# Patient Record
Sex: Female | Born: 1940
Health system: Southern US, Community
[De-identification: ages and names within clinical notes are randomized; demographics above are authoritative.]

## PROBLEM LIST (undated history)

## (undated) DIAGNOSIS — K8020 Calculus of gallbladder without cholecystitis without obstruction: Secondary | ICD-10-CM

## (undated) DIAGNOSIS — H269 Unspecified cataract: Secondary | ICD-10-CM

## (undated) DIAGNOSIS — I10 Essential (primary) hypertension: Secondary | ICD-10-CM

## (undated) DIAGNOSIS — C50919 Malignant neoplasm of unspecified site of unspecified female breast: Secondary | ICD-10-CM

## (undated) DIAGNOSIS — K859 Acute pancreatitis without necrosis or infection, unspecified: Secondary | ICD-10-CM

## (undated) DIAGNOSIS — R87619 Unspecified abnormal cytological findings in specimens from cervix uteri: Secondary | ICD-10-CM

## (undated) DIAGNOSIS — E119 Type 2 diabetes mellitus without complications: Secondary | ICD-10-CM

## (undated) DIAGNOSIS — IMO0002 Reserved for concepts with insufficient information to code with codable children: Secondary | ICD-10-CM

## (undated) DIAGNOSIS — T7840XA Allergy, unspecified, initial encounter: Secondary | ICD-10-CM

## (undated) DIAGNOSIS — K7460 Unspecified cirrhosis of liver: Secondary | ICD-10-CM

## (undated) DIAGNOSIS — F419 Anxiety disorder, unspecified: Secondary | ICD-10-CM

## (undated) DIAGNOSIS — B1920 Unspecified viral hepatitis C without hepatic coma: Secondary | ICD-10-CM

## (undated) DIAGNOSIS — D696 Thrombocytopenia, unspecified: Secondary | ICD-10-CM

## (undated) DIAGNOSIS — E669 Obesity, unspecified: Secondary | ICD-10-CM

## (undated) HISTORY — PX: OTHER SURGICAL HISTORY: SHX169

## (undated) HISTORY — PX: REDUCTION MAMMAPLASTY: SUR839

## (undated) HISTORY — DX: Reserved for concepts with insufficient information to code with codable children: IMO0002

## (undated) HISTORY — PX: CATARACT EXTRACTION, BILATERAL: SHX1313

## (undated) HISTORY — DX: Unspecified abnormal cytological findings in specimens from cervix uteri: R87.619

## (undated) HISTORY — DX: Essential (primary) hypertension: I10

## (undated) HISTORY — PX: POLYPECTOMY: SHX149

## (undated) HISTORY — PX: BREAST LUMPECTOMY: SHX2

## (undated) HISTORY — PX: COLONOSCOPY: SHX174

## (undated) HISTORY — DX: Anxiety disorder, unspecified: F41.9

## (undated) HISTORY — DX: Malignant neoplasm of unspecified site of unspecified female breast: C50.919

## (undated) HISTORY — DX: Obesity, unspecified: E66.9

## (undated) HISTORY — DX: Calculus of gallbladder without cholecystitis without obstruction: K80.20

## (undated) HISTORY — DX: Allergy, unspecified, initial encounter: T78.40XA

---
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
## 1970-12-23 HISTORY — PX: THYROIDECTOMY, PARTIAL: SHX18

## 1998-07-11 ENCOUNTER — Other Ambulatory Visit: Admission: RE | Admit: 1998-07-11 | Discharge: 1998-07-11 | Payer: Self-pay | Admitting: General Surgery

## 1998-12-23 DIAGNOSIS — C50919 Malignant neoplasm of unspecified site of unspecified female breast: Secondary | ICD-10-CM

## 1998-12-23 HISTORY — DX: Malignant neoplasm of unspecified site of unspecified female breast: C50.919

## 1999-09-06 ENCOUNTER — Encounter (INDEPENDENT_AMBULATORY_CARE_PROVIDER_SITE_OTHER): Payer: Self-pay | Admitting: Specialist

## 1999-09-06 ENCOUNTER — Ambulatory Visit (HOSPITAL_COMMUNITY): Admission: RE | Admit: 1999-09-06 | Discharge: 1999-09-06 | Payer: Self-pay | Admitting: General Surgery

## 1999-09-06 ENCOUNTER — Encounter: Payer: Self-pay | Admitting: General Surgery

## 1999-09-10 ENCOUNTER — Ambulatory Visit (HOSPITAL_COMMUNITY): Admission: RE | Admit: 1999-09-10 | Discharge: 1999-09-10 | Payer: Self-pay | Admitting: General Surgery

## 1999-09-10 ENCOUNTER — Encounter (INDEPENDENT_AMBULATORY_CARE_PROVIDER_SITE_OTHER): Payer: Self-pay

## 1999-10-26 ENCOUNTER — Other Ambulatory Visit: Admission: RE | Admit: 1999-10-26 | Discharge: 1999-10-26 | Payer: Self-pay | Admitting: General Surgery

## 2000-02-26 ENCOUNTER — Encounter (INDEPENDENT_AMBULATORY_CARE_PROVIDER_SITE_OTHER): Payer: Self-pay | Admitting: Specialist

## 2000-02-26 ENCOUNTER — Ambulatory Visit (HOSPITAL_COMMUNITY): Admission: RE | Admit: 2000-02-26 | Discharge: 2000-02-26 | Payer: Self-pay | Admitting: General Surgery

## 2000-10-27 ENCOUNTER — Encounter: Payer: Self-pay | Admitting: Emergency Medicine

## 2000-10-27 ENCOUNTER — Emergency Department (HOSPITAL_COMMUNITY): Admission: EM | Admit: 2000-10-27 | Discharge: 2000-10-27 | Payer: Self-pay | Admitting: Emergency Medicine

## 2001-03-07 ENCOUNTER — Encounter: Payer: Self-pay | Admitting: Emergency Medicine

## 2001-03-07 ENCOUNTER — Emergency Department (HOSPITAL_COMMUNITY): Admission: EM | Admit: 2001-03-07 | Discharge: 2001-03-07 | Payer: Self-pay | Admitting: Emergency Medicine

## 2003-08-16 ENCOUNTER — Encounter: Payer: Self-pay | Admitting: Oncology

## 2003-08-16 ENCOUNTER — Encounter: Admission: RE | Admit: 2003-08-16 | Discharge: 2003-08-16 | Payer: Self-pay | Admitting: Oncology

## 2003-09-26 ENCOUNTER — Ambulatory Visit (HOSPITAL_COMMUNITY): Admission: RE | Admit: 2003-09-26 | Discharge: 2003-09-26 | Payer: Self-pay | Admitting: Specialist

## 2003-12-24 HISTORY — PX: BREAST REDUCTION SURGERY: SHX8

## 2004-01-02 ENCOUNTER — Encounter (INDEPENDENT_AMBULATORY_CARE_PROVIDER_SITE_OTHER): Payer: Self-pay | Admitting: Specialist

## 2004-01-02 ENCOUNTER — Ambulatory Visit (HOSPITAL_COMMUNITY): Admission: RE | Admit: 2004-01-02 | Discharge: 2004-01-02 | Payer: Self-pay | Admitting: Specialist

## 2004-01-02 ENCOUNTER — Ambulatory Visit (HOSPITAL_BASED_OUTPATIENT_CLINIC_OR_DEPARTMENT_OTHER): Admission: RE | Admit: 2004-01-02 | Discharge: 2004-01-02 | Payer: Self-pay | Admitting: Specialist

## 2004-01-02 ENCOUNTER — Encounter (INDEPENDENT_AMBULATORY_CARE_PROVIDER_SITE_OTHER): Payer: Self-pay | Admitting: *Deleted

## 2004-01-27 ENCOUNTER — Other Ambulatory Visit: Admission: RE | Admit: 2004-01-27 | Discharge: 2004-01-27 | Payer: Self-pay | Admitting: Internal Medicine

## 2004-01-27 ENCOUNTER — Encounter: Admission: RE | Admit: 2004-01-27 | Discharge: 2004-01-27 | Payer: Self-pay | Admitting: Internal Medicine

## 2004-02-02 ENCOUNTER — Ambulatory Visit: Admission: RE | Admit: 2004-02-02 | Discharge: 2004-04-13 | Payer: Self-pay | Admitting: Radiation Oncology

## 2004-02-13 ENCOUNTER — Encounter: Admission: RE | Admit: 2004-02-13 | Discharge: 2004-02-13 | Payer: Self-pay | Admitting: Oncology

## 2004-04-20 ENCOUNTER — Ambulatory Visit: Admission: RE | Admit: 2004-04-20 | Discharge: 2004-04-20 | Payer: Self-pay | Admitting: Radiation Oncology

## 2004-07-25 ENCOUNTER — Encounter: Admission: RE | Admit: 2004-07-25 | Discharge: 2004-07-25 | Payer: Self-pay | Admitting: Internal Medicine

## 2004-08-07 ENCOUNTER — Other Ambulatory Visit: Admission: RE | Admit: 2004-08-07 | Discharge: 2004-08-07 | Payer: Self-pay | Admitting: Internal Medicine

## 2005-02-05 ENCOUNTER — Encounter: Admission: RE | Admit: 2005-02-05 | Discharge: 2005-02-05 | Payer: Self-pay | Admitting: Internal Medicine

## 2005-06-06 ENCOUNTER — Other Ambulatory Visit: Admission: RE | Admit: 2005-06-06 | Discharge: 2005-06-06 | Payer: Self-pay | Admitting: Internal Medicine

## 2005-06-12 ENCOUNTER — Encounter: Admission: RE | Admit: 2005-06-12 | Discharge: 2005-06-12 | Payer: Self-pay | Admitting: Internal Medicine

## 2005-06-13 ENCOUNTER — Ambulatory Visit: Payer: Self-pay | Admitting: Oncology

## 2005-07-01 ENCOUNTER — Encounter: Admission: RE | Admit: 2005-07-01 | Discharge: 2005-07-01 | Payer: Self-pay | Admitting: Internal Medicine

## 2006-06-19 ENCOUNTER — Other Ambulatory Visit: Admission: RE | Admit: 2006-06-19 | Discharge: 2006-06-19 | Payer: Self-pay | Admitting: Internal Medicine

## 2006-06-19 LAB — HM PAP SMEAR

## 2006-07-04 ENCOUNTER — Encounter: Admission: RE | Admit: 2006-07-04 | Discharge: 2006-07-04 | Payer: Self-pay | Admitting: Internal Medicine

## 2006-07-04 LAB — HM MAMMOGRAPHY

## 2007-02-23 ENCOUNTER — Emergency Department (HOSPITAL_COMMUNITY): Admission: EM | Admit: 2007-02-23 | Discharge: 2007-02-23 | Payer: Self-pay | Admitting: Emergency Medicine

## 2008-06-22 ENCOUNTER — Emergency Department (HOSPITAL_COMMUNITY): Admission: EM | Admit: 2008-06-22 | Discharge: 2008-06-22 | Payer: Self-pay | Admitting: Emergency Medicine

## 2008-11-22 ENCOUNTER — Emergency Department (HOSPITAL_COMMUNITY): Admission: EM | Admit: 2008-11-22 | Discharge: 2008-11-22 | Payer: Self-pay | Admitting: Emergency Medicine

## 2010-06-30 ENCOUNTER — Emergency Department (HOSPITAL_COMMUNITY): Admission: EM | Admit: 2010-06-30 | Discharge: 2010-07-01 | Payer: Self-pay | Admitting: Emergency Medicine

## 2010-09-13 ENCOUNTER — Inpatient Hospital Stay (HOSPITAL_COMMUNITY): Admission: AD | Admit: 2010-09-13 | Discharge: 2010-09-13 | Payer: Self-pay | Admitting: Obstetrics and Gynecology

## 2010-09-13 ENCOUNTER — Ambulatory Visit: Payer: Self-pay | Admitting: Physician Assistant

## 2010-09-17 ENCOUNTER — Ambulatory Visit: Payer: Self-pay | Admitting: Internal Medicine

## 2010-10-23 ENCOUNTER — Ambulatory Visit: Payer: Self-pay | Admitting: Internal Medicine

## 2010-10-25 ENCOUNTER — Ambulatory Visit: Payer: Self-pay | Admitting: Internal Medicine

## 2010-11-22 ENCOUNTER — Ambulatory Visit: Payer: Self-pay | Admitting: Internal Medicine

## 2011-03-07 LAB — COMPREHENSIVE METABOLIC PANEL
ALT: 73 U/L — ABNORMAL HIGH (ref 0–35)
AST: 76 U/L — ABNORMAL HIGH (ref 0–37)
Albumin: 3.2 g/dL — ABNORMAL LOW (ref 3.5–5.2)
Alkaline Phosphatase: 164 U/L — ABNORMAL HIGH (ref 39–117)
BUN: 9 mg/dL (ref 6–23)
CO2: 27 mEq/L (ref 19–32)
Calcium: 8.7 mg/dL (ref 8.4–10.5)
Chloride: 97 mEq/L (ref 96–112)
Creatinine, Ser: 0.76 mg/dL (ref 0.4–1.2)
GFR calc Af Amer: >60 mL/min (ref >60)
GFR calc non Af Amer: >60 mL/min (ref >60)
Glucose, Bld: 411 mg/dL — ABNORMAL HIGH (ref 70–99)
Potassium: 3.6 mEq/L (ref 3.5–5.1)
Sodium: 133 mEq/L — ABNORMAL LOW (ref 135–145)
Total Bilirubin: 1.2 mg/dL (ref 0.3–1.2)
Total Protein: 7.7 g/dL (ref 6.0–8.3)

## 2011-03-07 LAB — GLUCOSE, CAPILLARY
Glucose-Capillary: 419 mg/dL — ABNORMAL HIGH (ref 70–99)
Glucose-Capillary: 440 mg/dL — ABNORMAL HIGH (ref 70–99)

## 2011-03-07 LAB — GC/CHLAMYDIA PROBE AMP, GENITAL
Chlamydia, DNA Probe: NEGATIVE
GC Probe Amp, Genital: NEGATIVE

## 2011-03-07 LAB — WET PREP, GENITAL
Clue Cells Wet Prep HPF POC: NONE SEEN
Trich, Wet Prep: NONE SEEN
Yeast Wet Prep HPF POC: NONE SEEN

## 2011-03-28 ENCOUNTER — Ambulatory Visit (INDEPENDENT_AMBULATORY_CARE_PROVIDER_SITE_OTHER): Payer: Medicare Other | Admitting: Internal Medicine

## 2011-03-28 DIAGNOSIS — I10 Essential (primary) hypertension: Secondary | ICD-10-CM

## 2011-03-28 DIAGNOSIS — E119 Type 2 diabetes mellitus without complications: Secondary | ICD-10-CM

## 2011-05-10 NOTE — Op Note (Signed)
NAMERolanda Jay [name]] A                    ACCOUNT NO.:  [id number]   MEDICAL RECORD NO.:  [id number]                   PATIENT TYPE:  AMB   LOCATION:  DSC                                  FACILITY:  [facility]   PHYSICIAN:  Gerald L. Shon Hough, M.D.           DATE OF BIRTH:  [birth year]   DATE OF PROCEDURE:  01/02/2004  DATE OF DISCHARGE:                                 OPERATIVE REPORT   HISTORY OF PRESENT ILLNESS:  This is a 70 year old lady with severe  macromastia and back and shoulder pain secondary to large pendulous breasts.  She has had a history of lumpectomies involving her right breast in the past  x2.  This was taken care of Timothy E. Earlene Plater, M.D., general surgeon.  We had  preliminary discussions preoperatively in regards to the surgery.  I did  have a verbal conversation and consultation with Dr. Earlene Plater reveals that the  breast will be fine to go ahead and consider.  She has had increased back  and shoulder pain secondary to large pendulous breasts, increased  intertrigo, and pitting at both shoulders.  She wears pressure bras to  support the weight of her breasts with no avail.  She has used creams,  talcs, as well as other ointments under the breasts for intertriginous  changes, have become infected and irritation.   PLAN:  Bilateral breast reductions using the inferior pedicle technique,  excision of accessory breast tissue.   ANESTHESIA:  General.   SURGEON:  Gerald L. Shon Hough, M.D.   ASSISTANT:  Alethia Berthold, CFA-P.A.C.   DESCRIPTION OF PROCEDURE:  Preoperatively, the patient was set up and drawn  for the inferior pedicle reduction mammoplasty remarking the nipple areolar  complexes from over 40 cm to 21 cm from the suprasternal notch.  She then  underwent general anesthesia intubated orally.  Prep was done to the chest  and breast areas in the routine fashion.  Using Betadine soap and solution  and walled off with sterile towels and drapes so as to make a  sterile field.  0.25% Xylocaine with epinephrine was injected locally for vasoconstriction  to incisional sites.  1:400,000 concentration for a total of 150 mL per  side.  The patient underwent prep to the areas after intubation with  Hibiclens solution.  The areas were scored with a #15 blade.  Then the skin  over the inferior pedicles were deepithelialized with #20 blade.  Medial and  lateral fatty dermal pedicles were excised down to underlying fascia.  Laterally, more tissue was removed to secure excision of the breast tissue  as well as the accessory breast tissue.  During the procedure and under the  previous lumpectomy incisions, there was some increased scar tissue  involving the right breast.  No significant mass effects.  There were  several blue dome cyst areas that I removed separately.  After the flaps  were transposed and stayed with 3-0 Prolene, subcutaneous sutures were  placed  x2 layers with 3-0 Monocryl and then a running subcuticular stitch of  3-0 Monocryl and 5-0 Monocryl throughout the inverted T.  I removed over  1443 grams on the left side and 1288 grams on the right.  She withstood the  procedures very well.  Steri-Strips and soft dressings were applied  including Xeroform, 4x4, ABD's, and Hypafix tape.  Estimated blood loss was  less than 150 mL.  Complications were none.  At the end of the procedure,  the nipple areolar complexes were examined showing good blood supply and  suppleness.                                               Yaakov Guthrie. Shon Hough, M.D.    Cathie Hoops  D:  01/02/2004  T:  01/02/2004  Job:  [id number]

## 2011-06-17 ENCOUNTER — Other Ambulatory Visit: Payer: Self-pay | Admitting: Internal Medicine

## 2011-08-19 ENCOUNTER — Other Ambulatory Visit: Payer: Self-pay | Admitting: *Deleted

## 2011-08-19 MED ORDER — SITAGLIPTIN PHOSPHATE 100 MG PO TABS: 100.0000 mg | ORAL_TABLET | Freq: Every day | ORAL | Status: DC

## 2011-09-27 LAB — BASIC METABOLIC PANEL
BUN: 6 mg/dL (ref 6–23)
CO2: 30 mEq/L (ref 19–32)
Calcium: 8.7 mg/dL (ref 8.4–10.5)
Chloride: 107 mEq/L (ref 96–112)
Creatinine, Ser: 1.01 mg/dL (ref 0.4–1.2)
GFR calc Af Amer: >60 mL/min (ref >60)
GFR calc non Af Amer: 55 mL/min — ABNORMAL LOW (ref >60)
Glucose, Bld: 146 mg/dL — ABNORMAL HIGH (ref 70–99)
Potassium: 4 mEq/L (ref 3.5–5.1)
Sodium: 140 mEq/L (ref 135–145)

## 2011-09-27 LAB — URINALYSIS, ROUTINE W REFLEX MICROSCOPIC
Bilirubin Urine: NEGATIVE
Glucose, UA: NEGATIVE mg/dL
Hgb urine dipstick: NEGATIVE
Ketones, ur: NEGATIVE mg/dL
Nitrite: NEGATIVE
Protein, ur: 30 mg/dL — AB
Specific Gravity, Urine: 1.019 (ref 1.005–1.030)
Urobilinogen, UA: 2 mg/dL — ABNORMAL HIGH (ref 0.0–1.0)
pH: 6 (ref 5.0–8.0)

## 2011-09-27 LAB — URINE MICROSCOPIC-ADD ON

## 2011-09-27 LAB — POCT CARDIAC MARKERS
CKMB, poc: 1.5 ng/mL (ref 1.0–8.0)
Myoglobin, poc: 133 ng/mL (ref 12–200)
Troponin i, poc: <0.05 ng/mL (ref 0.00–0.09)

## 2011-09-27 LAB — CBC
HCT: 42.5 % (ref 36.0–46.0)
Hemoglobin: 14.2 g/dL (ref 12.0–15.0)
MCHC: 33.3 g/dL (ref 30.0–36.0)
MCV: 89.1 fL (ref 78.0–100.0)
Platelets: 139 10*3/uL — ABNORMAL LOW (ref 150–400)
RBC: 4.77 MIL/uL (ref 3.87–5.11)
RDW: 15.3 % (ref 11.5–15.5)
WBC: 5.6 10*3/uL (ref 4.0–10.5)

## 2011-09-27 LAB — DIFFERENTIAL
Basophils Absolute: 0 10*3/uL (ref 0.0–0.1)
Basophils Relative: 1 % (ref 0–1)
Eosinophils Absolute: 0.1 10*3/uL (ref 0.0–0.7)
Eosinophils Relative: 1 % (ref 0–5)
Lymphocytes Relative: 42 % (ref 12–46)
Lymphs Abs: 2.4 10*3/uL (ref 0.7–4.0)
Monocytes Absolute: 0.4 10*3/uL (ref 0.1–1.0)
Monocytes Relative: 6 % (ref 3–12)
Neutro Abs: 2.8 10*3/uL (ref 1.7–7.7)
Neutrophils Relative %: 50 % (ref 43–77)

## 2011-10-01 ENCOUNTER — Encounter: Payer: Self-pay | Admitting: Internal Medicine

## 2011-10-03 ENCOUNTER — Ambulatory Visit: Payer: Medicare Other | Admitting: Internal Medicine

## 2011-10-03 ENCOUNTER — Encounter: Payer: Medicare Other | Admitting: Internal Medicine

## 2011-10-07 ENCOUNTER — Encounter: Payer: Self-pay | Admitting: Internal Medicine

## 2011-10-07 ENCOUNTER — Ambulatory Visit (INDEPENDENT_AMBULATORY_CARE_PROVIDER_SITE_OTHER): Payer: Medicare Other | Admitting: Internal Medicine

## 2011-10-07 DIAGNOSIS — F411 Generalized anxiety disorder: Secondary | ICD-10-CM

## 2011-10-07 DIAGNOSIS — F419 Anxiety disorder, unspecified: Secondary | ICD-10-CM

## 2011-10-07 DIAGNOSIS — Z853 Personal history of malignant neoplasm of breast: Secondary | ICD-10-CM | POA: Insufficient documentation

## 2011-10-07 DIAGNOSIS — E119 Type 2 diabetes mellitus without complications: Secondary | ICD-10-CM | POA: Insufficient documentation

## 2011-10-07 DIAGNOSIS — I10 Essential (primary) hypertension: Secondary | ICD-10-CM

## 2011-10-07 LAB — BASIC METABOLIC PANEL
BUN: 13 mg/dL (ref 6–23)
CO2: 28 mEq/L (ref 19–32)
Calcium: 8.7 mg/dL (ref 8.4–10.5)
Chloride: 103 mEq/L (ref 96–112)
Creat: 1.03 mg/dL (ref 0.50–1.10)
Glucose, Bld: 213 mg/dL — ABNORMAL HIGH (ref 70–99)
Potassium: 4 mEq/L (ref 3.5–5.3)
Sodium: 139 mEq/L (ref 135–145)

## 2011-10-07 LAB — HEMOGLOBIN A1C
Hgb A1c MFr Bld: 7 % — ABNORMAL HIGH (ref <5.7)
Mean Plasma Glucose: 154 mg/dL — ABNORMAL HIGH (ref <117)

## 2011-10-07 NOTE — Progress Notes (Signed)
  Subjective:    Patient ID: Victoria [name], female    DOB: Sep 26, 1941, 70 y.o.   MRN: [id number]  HPI  70 year old black female with history of hypertension, obesity, adult onset diabetes in today with anxiety. 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 This was of the right breast. There was a reexcision in September 2000 showing a single microscopic focus of intraductal carcinoma (cribriform type). She did have radiation therapy. Has not seen oncologist since 2005. 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 2 intramedullary lymph nodes were negative for tumor. This tumor was ER/PR positive HER-2/neu negative she was subsequently referred to radiation oncology for further radiation of remaining right breast tissue. She had been on Prempro since 1997. There was no family history of breast cancer. Tamoxifen was recommended the patient declined taking it. She was lost to followup for about a year until around may 2002. Oncologist told her that breast reduction surgery could be pursued. She had a lot of back and neck pain. 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 Currently unemployed. Breast tumor was positive for estrogen and progesterone receptors. History of abnormal Pap smear [facility]IN-01-17-2004. Her sister died recently of colon cancer. She was in her 59s and lived in [place]. Patient had a colonoscopy by Dr. Juanda Chance September 2005 and repeat study was recommended in 10 years. Of concern is that patient has not had a mammogram since 2007. Order given for mammogram today. History of partial left thyroidectomy at [facility] in [facility]. Will get influenza immunization at [facility]. Had tetanus immunization 2005 and Pneumovax vaccine 2007. Last hemoglobin A1c in April 2012 was 6.2%. Patient reports feeling  lonely. Has appropriate grief reaction from sister's death. Denies being significantly depressed. No suicidal ideations.    Review of Systems     Objective:   Physical Exam chest clear; cardiac exam regular rate and rhythm; extremities without edema        Assessment & Plan:  Anxiety  Adult onset diabetes  History of breast cancer  Hypertension  Plan Xanax 0.5 mg when sees #60 (one half to one by mouth twice daily when necessary anxiety with 1 refill. See in 6 months. Hemoglobin A1c drawn today.  Hypertension  History of breast cancer  Xanax 0.5 mg #60 with 1 refill patient is to take one half to one tablet by mouth twice daily as needed for anxiety. Patient is to return in 6 months for physical exam.

## 2011-11-07 ENCOUNTER — Other Ambulatory Visit: Payer: Self-pay | Admitting: Internal Medicine

## 2011-11-07 DIAGNOSIS — Z853 Personal history of malignant neoplasm of breast: Secondary | ICD-10-CM

## 2011-11-07 DIAGNOSIS — Z1231 Encounter for screening mammogram for malignant neoplasm of breast: Secondary | ICD-10-CM

## 2011-12-06 ENCOUNTER — Ambulatory Visit: Payer: Medicare Other

## 2011-12-12 ENCOUNTER — Ambulatory Visit: Payer: Medicare Other

## 2012-01-14 ENCOUNTER — Other Ambulatory Visit: Payer: Self-pay | Admitting: Internal Medicine

## 2012-02-04 ENCOUNTER — Other Ambulatory Visit: Payer: Self-pay

## 2012-02-17 ENCOUNTER — Other Ambulatory Visit: Payer: Self-pay | Admitting: Internal Medicine

## 2012-03-31 ENCOUNTER — Ambulatory Visit (INDEPENDENT_AMBULATORY_CARE_PROVIDER_SITE_OTHER): Payer: Medicare Other | Admitting: Internal Medicine

## 2012-03-31 ENCOUNTER — Encounter: Payer: Self-pay | Admitting: Internal Medicine

## 2012-03-31 DIAGNOSIS — L259 Unspecified contact dermatitis, unspecified cause: Secondary | ICD-10-CM | POA: Diagnosis not present

## 2012-03-31 DIAGNOSIS — I10 Essential (primary) hypertension: Secondary | ICD-10-CM

## 2012-03-31 DIAGNOSIS — E119 Type 2 diabetes mellitus without complications: Secondary | ICD-10-CM

## 2012-04-01 LAB — HEMOGLOBIN A1C
Hgb A1c MFr Bld: 6.8 % — ABNORMAL HIGH (ref <5.7)
Mean Plasma Glucose: 148 mg/dL — ABNORMAL HIGH (ref <117)

## 2012-04-08 ENCOUNTER — Other Ambulatory Visit: Payer: Self-pay | Admitting: Internal Medicine

## 2012-04-19 ENCOUNTER — Encounter: Payer: Self-pay | Admitting: Internal Medicine

## 2012-04-19 NOTE — Patient Instructions (Signed)
Use cream prescribed sparingly on skin 3 times daily. Take Atarax or Benadryl for itching.

## 2012-04-19 NOTE — Progress Notes (Signed)
  Subjective:    Patient ID: Victoria [name], female    DOB: 04-26-1941, 71 y.o.   MRN: [id number]  HPI Patient has generalized itching on trunk, legs, and arms for the past several days. History of diabetes mellitus. History of hypertension and breast cancer. Had partial left thyroidectomy at [facility] in 1972. Had colonoscopy 2005 with repeat study recommended in 10 years. Had tetanus immunization February 2005, Pneumovax immunization July 2007. Blood pressure is controlled with losartan/ HCTZ. Patient takes Januvia and metformin for diabetes.     Review of Systems     Objective:   Physical Exam diffuse dermatitis on abdomen, legs and arms as well as upper back.        Assessment & Plan:  Probable contact dermatitis etiology unclear  Plan: Triamcinolone 0.1% 60 g in 4 ounces abuse or and cream to use sparingly on rash 3 times a day. May take Benadryl for itching by mouth. Call if not better in 5-7 days or sooner if worse. Hemoglobin A1c drawn today in followup of diabetes. Blood pressure under good control

## 2012-05-20 ENCOUNTER — Other Ambulatory Visit: Payer: Self-pay | Admitting: Internal Medicine

## 2012-08-18 ENCOUNTER — Other Ambulatory Visit: Payer: Self-pay | Admitting: Internal Medicine

## 2012-08-18 NOTE — Telephone Encounter (Signed)
[facility] [address] call for refill on losartan/HCTZ 100/25. Patient last seen here in the spring. D. for physical exam after 10/06/2012. Okay to refill losartan/HCTZ 100/25 #30 through 10/22/2012. She's also requesting refill on Xanax 0.5 mg #60 one half to one by mouth twice daily. Given #60 with no refill. Patient will be contacted about booking physical exam in late October

## 2012-08-20 ENCOUNTER — Other Ambulatory Visit: Payer: Self-pay

## 2012-08-20 MED ORDER — LOSARTAN POTASSIUM-HCTZ 100-25 MG PO TABS: 1.0000 | ORAL_TABLET | Freq: Every day | ORAL | Status: DC

## 2012-08-20 MED ORDER — ALPRAZOLAM 0.5 MG PO TBDP: 0.5000 mg | ORAL_TABLET | Freq: Two times a day (BID) | ORAL | Status: AC | PRN

## 2012-09-28 DIAGNOSIS — H2510 Age-related nuclear cataract, unspecified eye: Secondary | ICD-10-CM | POA: Diagnosis not present

## 2012-09-28 DIAGNOSIS — IMO0002 Reserved for concepts with insufficient information to code with codable children: Secondary | ICD-10-CM | POA: Diagnosis not present

## 2012-09-28 DIAGNOSIS — E119 Type 2 diabetes mellitus without complications: Secondary | ICD-10-CM | POA: Diagnosis not present

## 2012-10-05 ENCOUNTER — Ambulatory Visit (INDEPENDENT_AMBULATORY_CARE_PROVIDER_SITE_OTHER): Payer: Medicare Other | Admitting: Internal Medicine

## 2012-10-05 ENCOUNTER — Encounter: Payer: Self-pay | Admitting: Internal Medicine

## 2012-10-05 VITALS — BP 144/90 | HR 72 | Temp 97.7°F | Ht 67.75 in | Wt 240.0 lb

## 2012-10-05 DIAGNOSIS — M949 Disorder of cartilage, unspecified: Secondary | ICD-10-CM | POA: Diagnosis not present

## 2012-10-05 DIAGNOSIS — J309 Allergic rhinitis, unspecified: Secondary | ICD-10-CM

## 2012-10-05 DIAGNOSIS — E119 Type 2 diabetes mellitus without complications: Secondary | ICD-10-CM

## 2012-10-05 DIAGNOSIS — Z0000 Encounter for general adult medical examination without abnormal findings: Secondary | ICD-10-CM

## 2012-10-05 DIAGNOSIS — M899 Disorder of bone, unspecified: Secondary | ICD-10-CM

## 2012-10-05 DIAGNOSIS — Z853 Personal history of malignant neoplasm of breast: Secondary | ICD-10-CM | POA: Diagnosis not present

## 2012-10-05 DIAGNOSIS — I10 Essential (primary) hypertension: Secondary | ICD-10-CM

## 2012-10-05 DIAGNOSIS — M8580 Other specified disorders of bone density and structure, unspecified site: Secondary | ICD-10-CM

## 2012-10-05 LAB — POCT URINALYSIS DIPSTICK
Blood, UA: NEGATIVE
Glucose, UA: NEGATIVE
Ketones, UA: NEGATIVE
Nitrite, UA: NEGATIVE
Protein, UA: NEGATIVE
Spec Grav, UA: 1.015
Urobilinogen, UA: NEGATIVE
pH, UA: 7

## 2012-10-05 MED ORDER — METHYLPREDNISOLONE ACETATE 80 MG/ML IJ SUSP
80.0000 mg | Freq: Once | INTRAMUSCULAR | Status: AC
  Administered 2012-10-05: 80 mg via INTRAMUSCULAR

## 2012-10-05 NOTE — Progress Notes (Signed)
Subjective:    Patient ID: Victoria [name], female    DOB: [birth year], 71 y.o.   MRN: [id number]  HPI71 year Black female with history of DM HTN and Breast cancer for health maintenance and evaluation of medical problems. Has new complaints of trigger finger right third finger for about 6 months. Also, skin on legs is itchy and has been present. Needs Dermatology consult. Eye exam by Dr. Nile Riggs done last week and will have cataract extractions in the near future.Need to have mamogram. Need to be assessed for  runny nose, sinus pressure. History of Allergic Rhinitis. No history of glaucoma.  Out of Januvia last 2 months because of expense. Has been taking antihypertensive meds. Difficulty affording Januvia. 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 There was a reexcision in September 2000 showing a single microscopic focus of intraductal carcinoma. She did have radiation therapy. Has not been back to oncologist since 2005. 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 2 lymph nodes were negative for tumor. Tumor was estrogen receptor progesterone receptor positive. She subsequently was referred to radiation oncology for further radiation of remaining right breast tissue. She had been on as did replacement since 1997 at the time of her diagnosis. No family history of breast cancer. Tamoxifen was recommended the patient declined to take it. She was lost to followup for about a year in [place] may 2002. At that time she was told she could have breast reduction surgery which she had in 2005. 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 History of abnormal Pap smear 2005.  History of partial left thyroidectomy at [facility] in [place]. Has never been on thyroid replacement.  Family history: Sister died of colon cancer. She has 3 children.  Social history: Does not smoke or consume alcohol    Review of Systems  Constitutional: Negative.   HENT: Positive for congestion, rhinorrhea, sneezing and postnasal drip.   Eyes:       Bilateral cataracts; itchy eyes  Respiratory: Negative.   Genitourinary: Negative.   Musculoskeletal:       Right trigger finger  Neurological: Negative.   Hematological: Negative.   Psychiatric/Behavioral: Negative.        Objective:   Physical Exam  Vitals reviewed. Constitutional: She is oriented to person, place, and time. She appears well-developed and well-nourished. No distress.  HENT:  Head: Normocephalic and atraumatic.  Eyes: Conjunctivae normal and EOM are normal. Pupils are equal, round, and reactive to light. Right eye exhibits no discharge. Left eye exhibits no discharge. No scleral icterus.  Neck: Neck supple. No JVD present. Thyromegaly present.  Cardiovascular: Normal rate, regular rhythm and normal heart sounds.   Pulmonary/Chest: Effort normal and breath sounds normal. She has no wheezes. She has no rales.  Abdominal: Bowel sounds are normal. She exhibits no distension and no mass. There is no tenderness. There is no rebound and no guarding.  Genitourinary:       Refused pelvic exam  Musculoskeletal: Normal range of motion. She exhibits no edema.       Trigger finger right third finger  Lymphadenopathy:    She has no cervical adenopathy.  Neurological: She is alert and oriented to person, place, and time.  She has normal reflexes. No cranial nerve deficit. Coordination abnormal.  Skin: Skin is warm and dry. Rash noted. She is not diaphoretic.       Macular papular itchy areas lower legs  Psychiatric: She has a normal mood and affect. Her behavior is normal. Judgment and thought content normal.          Assessment & Plan:  Diabetes  mellitus-out of Januvia at the present time the past couple of months  Hypertension-has been compliant with medication according to patient and has not run out of these medications.  History of breast cancer-needs to get mammogram in the near future. Expresses some reluctance  Health maintenance-declines influenza immunization. Prescription given to get Zostavax vaccine at pharmacy  Right trigger finger-refer to hand surgeon for injection  Dermatitis lower extremities-refer to dermatologist  Allergic rhinitis-Depo-Medrol 80 mg IM given in office today. Patient is to avoid antihistamines containing decongestants because they will elevate her blood pressure. Return in 6 months at which time she'll need hemoglobin A1c, office visit, blood pressure check Subjective:   Patient presents for Medicare Annual/Subsequent preventive examination.   Review Past Medical/Family/Social: See above   Risk Factors  Current exercise habits: sedentary Dietary issues discussed: low fat low carb diet  Cardiac risk factors: Obesity (BMI >= 30 kg/m2).   Depression Screen  (Note: if answer to either of the following is "Yes", a more complete depression screening is indicated)  Over the past two weeks, have you felt down, depressed or hopeless? no Over the past two weeks, have you felt little interest or pleasure in doing things? no Have you lost interest or pleasure in daily life? no Do you often feel hopeless? no Do you cry easily over simple problems? No   Activities of Daily Living  In your present state of health, do you have any difficulty performing the following activities?:  Driving? No  Managing money? No  Feeding yourself? No  Getting from bed to chair? No  Climbing a flight of stairs? No  Preparing food and eating?: No  Bathing or showering? No  Getting dressed: No  Getting to the toilet? No  Using the toilet:No  Moving around from place to place: No  In the past year have you fallen  or had a near fall?:No  Are you sexually active? yes  Do you have more than one partner? No   Hearing Difficulties: No  Do you often ask people to speak up or repeat themselves? No  Do you experience ringing or noises in your ears? Yes  Do you have difficulty understanding soft or whispered voices? No  Do you feel that you have a problem with memory? Yes  Do you often misplace items? No  Do you feel safe at home? Yes   Cognitive Testing  Alert? Yes Normal Appearance?Yes  Oriented to person? Yes Place? Yes  Time? Yes  Recall of three objects? Yes  Can perform simple calculations? Yes  Displays appropriate judgment?Yes  Can read the correct time from a watch face?Yes   List the Names of Other Physician/Practitioners you currently use: Dr. Nile Riggs.   Indicate any recent Medical Services you may have received from other than [facility] providers in the past year (date may be approximate).   Screening Tests / Date Colonoscopy   - done 2005                  Zostavax - wait until age 76 Mammogram - order given Influenza Vaccine declined  Tetanus/tdapFeb 2005   Objective:   Vision by Snellen chart: - done by Dr. Nile Riggs    General appearance: Appears stated age and mildly obese  Head: Normocephalic, without obvious abnormality, atraumatic  Eyes: conj clear, EOMi PEERLA  Ears: normal TM's and external ear canals both ears  Nose: Nares normal. Septum midline. Mucosa normal. No drainage or sinus tenderness.  Throat: lips, mucosa, and tongue normal; teeth and gums normal  Neck: no adenopathy, no carotid bruit, no JVD, supple, symmetrical, trachea midline and thyroid not enlarged, symmetric, no tenderness/mass/nodules  No CVA tenderness.  Lungs: clear to auscultation bilaterally  Breasts: normal appearance, no masses or tenderness,  Heart: regular rate and rhythm, S1, S2 normal, no murmur, click, rub or gallop  Abdomen: soft, non-tender; bowel sounds normal; no masses, no organomegaly   Musculoskeletal: ROM normal in all joints, no crepitus, no deformity, Normal muscle strengthen. Back  is symmetric, no curvature. Skin: Skin color, texture, turgor normal. No rashes or lesions  Lymph nodes: Cervical, supraclavicular, and axillary nodes normal.  Neurologic: CN 2 -12 Normal, Normal symmetric reflexes. Normal coordination and gait  Psych: Alert & Oriented x 3, Mood appear stable.    Assessment:    Annual wellness medicare exam   Plan:    During the course of the visit the patient was educated and counseled about appropriate screening and preventive services including:  Screening mammography - order given Colorectal cancer screening  Colonoscopy 09/11/04 Shingles vaccine. Prescription given to that she can get the vaccine at the pharmacy or Medicare part D.  Screen + for depression  - done  Diet review for nutrition referral? Yes ____ Not Indicated __x__  Patient Instructions (the written plan) was given to the patient.  Medicare Attestation  I have personally reviewed:  The patient's medical and social history  Their use of alcohol, tobacco or illicit drugs  Their current medications and supplements  The patient's functional ability including ADLs,fall risks, home safety risks, cognitive, and hearing and visual impairment  Diet and physical activities  Evidence for depression or mood disorders  The patient's weight, height, BMI, and visual acuity have been recorded in the chart. I have made referrals, counseling, and provided education to the patient based on review of the above and I have provided the patient with a written personalized care plan for preventive services.    !

## 2012-10-05 NOTE — Patient Instructions (Addendum)
Please take  diabetic medications. Try to find Januvia at a better price. May consider patient assistant program through health department. We will try to get samples of Januvia for you. In the meantime take  Trajenta 5 mg daily. You have been given Depo-Medrol 80 mg IM for allergic rhinitis.

## 2012-10-06 LAB — LIPID PANEL
Cholesterol: 119 mg/dL (ref 0–200)
HDL: 41 mg/dL (ref >39)
LDL Cholesterol: 60 mg/dL (ref 0–99)
Total CHOL/HDL Ratio: 2.9 Ratio
Triglycerides: 88 mg/dL (ref <150)
VLDL: 18 mg/dL (ref 0–40)

## 2012-10-06 LAB — CBC WITH DIFFERENTIAL/PLATELET
Basophils Absolute: 0 10*3/uL (ref 0.0–0.1)
Basophils Relative: 0 % (ref 0–1)
Eosinophils Absolute: 0.1 10*3/uL (ref 0.0–0.7)
Eosinophils Relative: 1 % (ref 0–5)
HCT: 39.4 % (ref 36.0–46.0)
Hemoglobin: 13.6 g/dL (ref 12.0–15.0)
Lymphocytes Relative: 49 % — ABNORMAL HIGH (ref 12–46)
Lymphs Abs: 2.9 10*3/uL (ref 0.7–4.0)
MCH: 29.2 pg (ref 26.0–34.0)
MCHC: 34.5 g/dL (ref 30.0–36.0)
MCV: 84.7 fL (ref 78.0–100.0)
Monocytes Absolute: 0.4 10*3/uL (ref 0.1–1.0)
Monocytes Relative: 7 % (ref 3–12)
Neutro Abs: 2.5 10*3/uL (ref 1.7–7.7)
Neutrophils Relative %: 43 % (ref 43–77)
Platelets: 123 10*3/uL — ABNORMAL LOW (ref 150–400)
RBC: 4.65 MIL/uL (ref 3.87–5.11)
RDW: 14.8 % (ref 11.5–15.5)
WBC: 5.9 10*3/uL (ref 4.0–10.5)

## 2012-10-06 LAB — COMPREHENSIVE METABOLIC PANEL
ALT: 55 U/L — ABNORMAL HIGH (ref 0–35)
AST: 54 U/L — ABNORMAL HIGH (ref 0–37)
Albumin: 3.5 g/dL (ref 3.5–5.2)
Alkaline Phosphatase: 81 U/L (ref 39–117)
BUN: 13 mg/dL (ref 6–23)
CO2: 25 mEq/L (ref 19–32)
Calcium: 8.7 mg/dL (ref 8.4–10.5)
Chloride: 104 mEq/L (ref 96–112)
Creat: 0.82 mg/dL (ref 0.50–1.10)
Glucose, Bld: 108 mg/dL — ABNORMAL HIGH (ref 70–99)
Potassium: 3.8 mEq/L (ref 3.5–5.3)
Sodium: 137 mEq/L (ref 135–145)
Total Bilirubin: 0.8 mg/dL (ref 0.3–1.2)
Total Protein: 7.1 g/dL (ref 6.0–8.3)

## 2012-10-06 LAB — TSH: TSH: 0.93 u[IU]/mL (ref 0.350–4.500)

## 2012-10-06 LAB — HEMOGLOBIN A1C
Hgb A1c MFr Bld: 6.5 % — ABNORMAL HIGH (ref <5.7)
Mean Plasma Glucose: 140 mg/dL — ABNORMAL HIGH (ref <117)

## 2012-10-06 LAB — VITAMIN D 25 HYDROXY (VIT D DEFICIENCY, FRACTURES): Vit D, 25-Hydroxy: 62 ng/mL (ref 30–89)

## 2012-10-06 LAB — MICROALBUMIN, URINE: Microalb, Ur: 1.35 mg/dL (ref 0.00–1.89)

## 2012-10-12 DIAGNOSIS — I10 Essential (primary) hypertension: Secondary | ICD-10-CM | POA: Diagnosis not present

## 2012-10-12 DIAGNOSIS — E119 Type 2 diabetes mellitus without complications: Secondary | ICD-10-CM | POA: Diagnosis not present

## 2012-10-12 DIAGNOSIS — M899 Disorder of bone, unspecified: Secondary | ICD-10-CM | POA: Diagnosis not present

## 2012-10-14 DIAGNOSIS — H2510 Age-related nuclear cataract, unspecified eye: Secondary | ICD-10-CM | POA: Diagnosis not present

## 2012-10-21 DIAGNOSIS — H2510 Age-related nuclear cataract, unspecified eye: Secondary | ICD-10-CM | POA: Diagnosis not present

## 2012-10-27 ENCOUNTER — Other Ambulatory Visit: Payer: Self-pay | Admitting: Internal Medicine

## 2012-10-27 ENCOUNTER — Other Ambulatory Visit: Payer: Self-pay

## 2012-10-27 MED ORDER — METFORMIN HCL 500 MG PO TABS: 500.0000 mg | ORAL_TABLET | Freq: Two times a day (BID) | ORAL | Status: DC

## 2012-10-30 ENCOUNTER — Other Ambulatory Visit: Payer: Self-pay | Admitting: Internal Medicine

## 2012-10-30 NOTE — Telephone Encounter (Signed)
Spoke w/Betty @ pharmacy to advise that I failed to add 6 refills per Dr. Lenord Fellers.  She'll update their system on their side.

## 2012-11-16 ENCOUNTER — Other Ambulatory Visit: Payer: Self-pay | Admitting: Internal Medicine

## 2012-11-22 DIAGNOSIS — B1920 Unspecified viral hepatitis C without hepatic coma: Secondary | ICD-10-CM

## 2012-11-22 DIAGNOSIS — K7460 Unspecified cirrhosis of liver: Secondary | ICD-10-CM

## 2012-11-22 DIAGNOSIS — K859 Acute pancreatitis without necrosis or infection, unspecified: Secondary | ICD-10-CM

## 2012-11-22 DIAGNOSIS — K8020 Calculus of gallbladder without cholecystitis without obstruction: Secondary | ICD-10-CM

## 2012-11-22 DIAGNOSIS — D696 Thrombocytopenia, unspecified: Secondary | ICD-10-CM

## 2012-11-22 HISTORY — DX: Acute pancreatitis without necrosis or infection, unspecified: K85.90

## 2012-11-22 HISTORY — DX: Calculus of gallbladder without cholecystitis without obstruction: K80.20

## 2012-11-22 HISTORY — DX: Unspecified viral hepatitis C without hepatic coma: B19.20

## 2012-11-22 HISTORY — DX: Unspecified cirrhosis of liver: K74.60

## 2012-11-22 HISTORY — DX: Thrombocytopenia, unspecified: D69.6

## 2012-12-14 ENCOUNTER — Inpatient Hospital Stay (HOSPITAL_COMMUNITY)
Admission: EM | Admit: 2012-12-14 | Discharge: 2012-12-18 | DRG: 440 | Disposition: A | Discharge status: Home / Self Care | Payer: Medicare Other | Attending: Internal Medicine | Admitting: Internal Medicine

## 2012-12-14 ENCOUNTER — Encounter (HOSPITAL_COMMUNITY): Payer: Self-pay | Admitting: *Deleted

## 2012-12-14 ENCOUNTER — Emergency Department (HOSPITAL_COMMUNITY): Payer: Medicare Other

## 2012-12-14 DIAGNOSIS — D696 Thrombocytopenia, unspecified: Secondary | ICD-10-CM | POA: Diagnosis present

## 2012-12-14 DIAGNOSIS — K859 Acute pancreatitis without necrosis or infection, unspecified: Principal | ICD-10-CM

## 2012-12-14 DIAGNOSIS — I10 Essential (primary) hypertension: Secondary | ICD-10-CM | POA: Diagnosis not present

## 2012-12-14 DIAGNOSIS — Z853 Personal history of malignant neoplasm of breast: Secondary | ICD-10-CM

## 2012-12-14 DIAGNOSIS — K8020 Calculus of gallbladder without cholecystitis without obstruction: Secondary | ICD-10-CM | POA: Diagnosis not present

## 2012-12-14 DIAGNOSIS — J309 Allergic rhinitis, unspecified: Secondary | ICD-10-CM | POA: Diagnosis present

## 2012-12-14 DIAGNOSIS — E119 Type 2 diabetes mellitus without complications: Secondary | ICD-10-CM | POA: Diagnosis present

## 2012-12-14 DIAGNOSIS — K7460 Unspecified cirrhosis of liver: Secondary | ICD-10-CM | POA: Diagnosis not present

## 2012-12-14 DIAGNOSIS — R9389 Abnormal findings on diagnostic imaging of other specified body structures: Secondary | ICD-10-CM | POA: Diagnosis present

## 2012-12-14 LAB — CBC WITH DIFFERENTIAL/PLATELET
Basophils Absolute: 0 10*3/uL (ref 0.0–0.1)
Basophils Relative: 0 % (ref 0–1)
Eosinophils Absolute: 0 10*3/uL (ref 0.0–0.7)
Eosinophils Relative: 1 % (ref 0–5)
HCT: 39.4 % (ref 36.0–46.0)
Hemoglobin: 13.7 g/dL (ref 12.0–15.0)
Lymphocytes Relative: 50 % — ABNORMAL HIGH (ref 12–46)
Lymphs Abs: 3.6 10*3/uL (ref 0.7–4.0)
MCH: 29.5 pg (ref 26.0–34.0)
MCHC: 34.8 g/dL (ref 30.0–36.0)
MCV: 84.7 fL (ref 78.0–100.0)
Monocytes Absolute: 0.5 10*3/uL (ref 0.1–1.0)
Monocytes Relative: 7 % (ref 3–12)
Neutro Abs: 3.1 10*3/uL (ref 1.7–7.7)
Neutrophils Relative %: 43 % (ref 43–77)
Platelets: 126 10*3/uL — ABNORMAL LOW (ref 150–400)
RBC: 4.65 MIL/uL (ref 3.87–5.11)
RDW: 13.9 % (ref 11.5–15.5)
WBC: 7.4 10*3/uL (ref 4.0–10.5)

## 2012-12-14 LAB — COMPREHENSIVE METABOLIC PANEL
ALT: 40 U/L — ABNORMAL HIGH (ref 0–35)
AST: 40 U/L — ABNORMAL HIGH (ref 0–37)
Albumin: 3.3 g/dL — ABNORMAL LOW (ref 3.5–5.2)
Alkaline Phosphatase: 64 U/L (ref 39–117)
BUN: 15 mg/dL (ref 6–23)
CO2: 29 mEq/L (ref 19–32)
Calcium: 9.4 mg/dL (ref 8.4–10.5)
Chloride: 99 mEq/L (ref 96–112)
Creatinine, Ser: 0.7 mg/dL (ref 0.50–1.10)
GFR calc Af Amer: >90 mL/min (ref >90)
GFR calc non Af Amer: 86 mL/min — ABNORMAL LOW (ref >90)
Glucose, Bld: 155 mg/dL — ABNORMAL HIGH (ref 70–99)
Potassium: 4 mEq/L (ref 3.5–5.1)
Sodium: 135 mEq/L (ref 135–145)
Total Bilirubin: 0.5 mg/dL (ref 0.3–1.2)
Total Protein: 7.4 g/dL (ref 6.0–8.3)

## 2012-12-14 LAB — LIPASE, BLOOD: Lipase: 489 U/L — ABNORMAL HIGH (ref 11–59)

## 2012-12-14 MED ORDER — HYDROMORPHONE HCL PF 1 MG/ML IJ SOLN
1.0000 mg | Freq: Once | INTRAMUSCULAR | Status: AC
  Administered 2012-12-14: 1 mg via INTRAVENOUS
  Filled 2012-12-14: qty 1

## 2012-12-14 MED ORDER — ONDANSETRON HCL 4 MG/2ML IJ SOLN: 4.0000 mg | Freq: Four times a day (QID) | INTRAMUSCULAR | Status: DC | PRN

## 2012-12-14 MED ORDER — HYDROMORPHONE HCL PF 1 MG/ML IJ SOLN
0.5000 mg | INTRAMUSCULAR | Status: DC | PRN
  Administered 2012-12-15 – 2012-12-18 (×12): 0.5 mg via INTRAVENOUS
  Filled 2012-12-14 (×12): qty 1

## 2012-12-14 MED ORDER — AMLODIPINE BESYLATE 2.5 MG PO TABS
2.5000 mg | ORAL_TABLET | Freq: Every day | ORAL | Status: DC
  Administered 2012-12-14 – 2012-12-17 (×4): 2.5 mg via ORAL
  Filled 2012-12-14 (×5): qty 1

## 2012-12-14 MED ORDER — ALBUTEROL SULFATE (5 MG/ML) 0.5% IN NEBU: 2.5000 mg | INHALATION_SOLUTION | RESPIRATORY_TRACT | Status: DC | PRN

## 2012-12-14 MED ORDER — ONDANSETRON HCL 4 MG/2ML IJ SOLN
4.0000 mg | Freq: Once | INTRAMUSCULAR | Status: AC
  Administered 2012-12-14: 4 mg via INTRAVENOUS
  Filled 2012-12-14: qty 2

## 2012-12-14 MED ORDER — OXYCODONE HCL 5 MG PO TABS
5.0000 mg | ORAL_TABLET | ORAL | Status: DC | PRN
  Filled 2012-12-14: qty 1

## 2012-12-14 MED ORDER — IOHEXOL 300 MG/ML  SOLN
100.0000 mL | Freq: Once | INTRAMUSCULAR | Status: AC | PRN
  Administered 2012-12-14: 100 mL via INTRAVENOUS

## 2012-12-14 MED ORDER — ACETAMINOPHEN 650 MG RE SUPP: 650.0000 mg | Freq: Four times a day (QID) | RECTAL | Status: DC | PRN

## 2012-12-14 MED ORDER — ACETAMINOPHEN 325 MG PO TABS: 650.0000 mg | ORAL_TABLET | Freq: Four times a day (QID) | ORAL | Status: DC | PRN

## 2012-12-14 MED ORDER — PANTOPRAZOLE SODIUM 40 MG IV SOLR
40.0000 mg | Freq: Every day | INTRAVENOUS | Status: DC
  Administered 2012-12-14 – 2012-12-17 (×4): 40 mg via INTRAVENOUS
  Filled 2012-12-14 (×5): qty 40

## 2012-12-14 MED ORDER — CETAPHIL MOISTURIZING EX LOTN
TOPICAL_LOTION | CUTANEOUS | Status: DC | PRN
  Filled 2012-12-14: qty 473

## 2012-12-14 MED ORDER — ENOXAPARIN SODIUM 60 MG/0.6ML ~~LOC~~ SOLN
50.0000 mg | SUBCUTANEOUS | Status: DC
  Administered 2012-12-14 – 2012-12-17 (×4): 50 mg via SUBCUTANEOUS
  Filled 2012-12-14 (×5): qty 0.6

## 2012-12-14 MED ORDER — HYDROCERIN EX CREA
TOPICAL_CREAM | CUTANEOUS | Status: DC | PRN
  Administered 2012-12-14 – 2012-12-16 (×2): via TOPICAL
  Filled 2012-12-14 (×3): qty 113

## 2012-12-14 MED ORDER — ONDANSETRON HCL 4 MG PO TABS: 4.0000 mg | ORAL_TABLET | Freq: Four times a day (QID) | ORAL | Status: DC | PRN

## 2012-12-14 MED ORDER — SODIUM CHLORIDE 0.9 % IV SOLN
INTRAVENOUS | Status: DC
  Administered 2012-12-14 – 2012-12-15 (×2): via INTRAVENOUS

## 2012-12-14 NOTE — H&P (Signed)
[facility] History and Physical  Victoria [name] WNU:[id number] DOB: [birth year] DOA: 12/14/2012  Referring physician: Dr. Bethann Berkshire PCP: [name], MD  Specialists: [facility] gastroenterology.  Chief Complaint: Abdominal pain  HPI: Victoria [name] is a 71 y.o. female with history of DM 2, HTN, breast cancer, partial thyroidectomy presents to ED on 12/23 with one week history of abdominal pain and bloating. She indicates that her bloating actually started approximately 2 weeks ago but since the last one week her symptoms have worsened. She complains of upper abdominal pain, intermittent, radiating around both sides to the back, sharp and 10/10 at its worst, worsened by hunger and relieved somewhat with by mouth intake and Nexium. She complains of intermittent bitter tasting fluid in the mouth. Nauseous with reduced by mouth intake. No vomiting, constipation or diarrhea. No fever or chills. No recent change in home medications or insect bites. She thought that these symptoms were due to gastritis which she had 7 years ago and tried over-the-counter Nexium without significant relief. She presented to the emergency department where her lipase is approximately 490 and CT abdomen shows cholelithiasis, mild gallbladder wall thickening, cirrhotic liver, findings suggesting acute pancreatitis involving the head of the pancreas, indeterminate 14 mm hypodensity in the head of the pancreas. neoplasm is not excluded and sigmoid diverticulosis without evidence of acute diverticulitis. The hospitalist service is requested to admit for further evaluation and management.   Review of Systems: All systems reviewed and apart from history of presenting illness, pertinent for dry itchy skin-long-standing. Denies any change in her bowel habits. Prior to this acute episode, patient was actually gaining weight. All other systems negative.  Past Medical History  Diagnosis Date  . Hypertension   . Cancer     breast  . Diabetes mellitus   . Abnormal finding on Pap smear   . Obesity    Past Surgical History  Procedure Date  . Thyroidectomy, partial 1972    left  . Breast reduction surgery 2005  . Cataract extraction, bilateral   . Breast lumpectomy right   Social History:  reports that she has never smoked. She has never used smokeless tobacco. She reports that she does not drink alcohol or use illicit drugs. Patient is single. Independent of activities of daily living.  Allergies  Allergen Reactions  . Lisinopril Cough    Family History  Problem Relation Age of Onset  . Diabetes Mother   . Stroke Mother     Prior to Admission medications   Medication Sig Start Date End Date Taking? Authorizing Provider  amLODipine (NORVASC) 2.5 MG tablet Take 2.5 mg by mouth at bedtime.   Yes Historical Provider, MD  ibuprofen (ADVIL,MOTRIN) 200 MG tablet Take 400 mg by mouth every 6 (six) hours as needed. For pain   Yes Historical Provider, MD  losartan-hydrochlorothiazide (HYZAAR) 100-25 MG per tablet Take 1 tablet by mouth every morning.   Yes Historical Provider, MD  metFORMIN (GLUCOPHAGE) 500 MG tablet Take 500 mg by mouth 2 (two) times daily.   Yes Historical Provider, MD  sitaGLIPtin (JANUVIA) 100 MG tablet Take 100 mg by mouth every morning.   Yes Historical Provider, MD   Physical Exam: Filed Vitals:   12/14/12 1043 12/14/12 1630  BP: 128/86 140/67  Pulse: 80 60  Temp: 98.1 F (36.7 C) 97.7 F (36.5 C)  TempSrc: Oral Oral  Resp: 20 18  SpO2: 100% 98%     General exam: Moderately built and nourished female patient, lying comfortably  supine on the gurney without any distress.  Head, eyes and ENT: Nontraumatic and normocephalic. Pupils equally reacting to light and accommodation. Oral mucosal is mildly dry. Multiple missing teeth.  Neck: Supple. No JVD, carotid bruits or thyromegaly.  Lymphatics: No lymphadenopathy.  Respiratory system: Clear to auscultation. No increased  work of breathing.  Cardiovascular system: First and second heart sounds heard, regular rate and rhythm. No JVD, murmur she pedal edema.  Gastrointestinal system: Abdomen is obese, abdominal striae, soft, nontender and nondistended. No organomegaly or masses appreciated. Normal bowel sounds heard.  Central nervous system: Alert and oriented. No focal neurological deficits.  Extremities: Symmetrical 5 x 5 power. Peripheral pulses symmetrically felt.  Skin: Dry with pruritic marks.  Muscular skeletal system: Negative exam   Labs on Admission:  Basic Metabolic Panel:  Lab 12/14/12 5621  NA 135  K 4.0  CL 99  CO2 29  GLUCOSE 155*  BUN 15  CREATININE 0.70  CALCIUM 9.4  MG --  PHOS --   Liver Function Tests:  Lab 12/14/12 1150  AST 40*  ALT 40*  ALKPHOS 64  BILITOT 0.5  PROT 7.4  ALBUMIN 3.3*    Lab 12/14/12 1150  LIPASE 489*  AMYLASE --   No results found for this basename: AMMONIA:5 in the last 168 hours CBC:  Lab 12/14/12 1150  WBC 7.4  NEUTROABS 3.1  HGB 13.7  HCT 39.4  MCV 84.7  PLT 126*   Cardiac Enzymes: No results found for this basename: CKTOTAL:5,CKMB:5,CKMBINDEX:5,TROPONINI:5 in the last 168 hours  BNP (last 3 results) No results found for this basename: PROBNP:3 in the last 8760 hours CBG: No results found for this basename: GLUCAP:5 in the last 168 hours  Radiological Exams on Admission: Ct Abdomen Pelvis W Contrast  12/14/2012  *RADIOLOGY REPORT*  Clinical Data: Abdominal pain  CT ABDOMEN AND PELVIS WITH CONTRAST  Technique:  Multidetector CT imaging of the abdomen and pelvis was performed following the standard protocol during bolus administration of intravenous contrast.  Contrast: OMNIPAQUE IOHEXOL 300 MG/ML  SOLN  Comparison: None.  Findings: Several gallstones are noted.  Mild gallbladder wall thickening.  Fat planes within the head of the pancreas are blurred.  There is subtle stranding about the pancreatic head.  Findings are  suspicious for acute pancreatitis.  14 mm hypodensity is present in the head of the pancreas which is indeterminate.  See image 21.  Diffuse hepatic steatosis.  The left lobe of the liver is enlarged with a nodular contour compatible with cirrhosis.  Spleen, right adrenal gland, right kidney are within normal limits. Inflammatory changes of the left adrenal gland are suspected. Simple cyst in the lower pole of the left kidney.  Normal appendix.  Sigmoid diverticulosis without evidence of acute diverticulitis.  Unremarkable bladder.  Uterus and adnexa are within normal limits.  Bilateral inguinal hernia containing adipose tissue.  Right gonadal vein is prominent and dilated.  IMPRESSION: Cholelithiasis.  Mild gallbladder wall thickening is nonspecific.  Cirrhotic liver.  Findings suggesting acute pancreatitis involving the head of the pancreas.  Indeterminate 14 mm hypodensity in the head of the pancreas. Neoplasm is not excluded.  At the minimum, short-term 30-month follow-up CT can be performed to reassess.  Sigmoid diverticulosis without evidence of acute diverticulitis.   Original Report Authenticated By: Jolaine Click, M.D.     Assessment/Plan Principal Problem:  *Acute pancreatitis Active Problems:  Diabetes mellitus  Hypertension  History of breast cancer  Cholelithiasis  Thrombocytopenia  Acute pancreatitis  Unclear etiology. No history of alcohol abuse, hypertriglyceridemia (normal triglycerides in October 2014), or recent change in medications.? Biliary pancreatitis -minimally elevated transaminases and gallstones on CT abdomen. Also CT abdomen suggests indeterminate 14 mm hypodensity in head of pancreas which needs further evaluation.  We'll admit to medical bed for further evaluation and management.  Place on clear liquids, IV fluids, IV PPI and pain management.  We'll repeat fasting lipids in a.m.  [facility] gastroenterology consulted for assistance with further evaluation.  Type 2  diabetes mellitus  Patient has not taken her Januvia for 2 weeks since onset of symptoms.  Hemoglobin A1c in October was well controlled.  Place on sliding scale insulin and monitor.  Hypertension  Controlled.  Hold losartan-HCTZ. Continue amlodipine at bedtime and monitor.  Mild thrombocytopenia  Chronic and stable.  History of breast cancer  Status post right lumpectomy and radiation.  Said to be in remission.  Status post partial thyroidectomy  Clinically euthyroid  TSH in October 2013 was normal.  Cirrhosis on CT abdomen  Unclear etiology.  Code Status: Full Family Communication: Discussed with patient Disposition Plan: Home in medically stable hopefully within 2 midnights.  Time spent: 50 minutes  [facility] Pager [phone]  If 7PM-7AM, please contact night-coverage [email or web address] Password [facility] 12/14/2012, 4:34 PM

## 2012-12-14 NOTE — Progress Notes (Signed)
CM reviewed pt's medicines listed in EDP note CM printed out the drug company information including the applications for Januvia, norvasc &  hyzaar Provided [facility] medication list which indicates metformin is $4 and encouraged pt to review this list with her pcp to help decrease her cost of medicines Pt voiced understanding and appreciation of services rendered Written information placed in her pt belonging bag

## 2012-12-14 NOTE — ED Provider Notes (Signed)
History     CSN: [id number]  Arrival date & time 12/14/12  1013   First MD Initiated Contact with Patient 12/14/12 1158      Chief Complaint  Patient presents with  . Abdominal Pain    (Consider location/radiation/quality/duration/timing/severity/associated sxs/prior treatment) Patient is a 71 y.o. female presenting with abdominal pain. The history is provided by the patient (the pt complains of nauseau and pain for a few days). No language interpreter was used.  Abdominal Pain The primary symptoms of the illness include abdominal pain and nausea. The primary symptoms of the illness do not include fatigue or diarrhea. The current episode started more than 2 days ago. The onset of the illness was gradual. The problem has not changed since onset. Associated with: nothing. Symptoms associated with the illness do not include chills, hematuria, frequency or back pain. Significant associated medical issues do not include PUD.    Past Medical History  Diagnosis Date  . Hypertension   . Cancer     breast  . Diabetes mellitus   . Abnormal finding on Pap smear   . Obesity     Past Surgical History  Procedure Date  . Thyroidectomy, partial 1972    left  . Breast reduction surgery 2005    Family History  Problem Relation Age of Onset  . Diabetes Mother   . Stroke Mother     History  Substance Use Topics  . Smoking status: Never Smoker   . Smokeless tobacco: Not on file  . Alcohol Use: No    OB History    Grav Para Term Preterm Abortions TAB SAB Ect Mult Living                  Review of Systems  Constitutional: Negative for chills and fatigue.  HENT: Negative for congestion, sinus pressure and ear discharge.   Eyes: Negative for discharge.  Respiratory: Negative for cough.   Cardiovascular: Negative for chest pain.  Gastrointestinal: Positive for nausea and abdominal pain. Negative for diarrhea.  Genitourinary: Negative for frequency and hematuria.   Musculoskeletal: Negative for back pain.  Skin: Negative for rash.  Neurological: Negative for seizures and headaches.  Hematological: Negative.   Psychiatric/Behavioral: Negative for hallucinations.    Allergies  Lisinopril  Home Medications   Current Outpatient Rx  Name  Route  Sig  Dispense  Refill  . AMLODIPINE BESYLATE 2.5 MG PO TABS   Oral   Take 2.5 mg by mouth at bedtime.         . IBUPROFEN 200 MG PO TABS   Oral   Take 400 mg by mouth every 6 (six) hours as needed. For pain         . LOSARTAN POTASSIUM-HCTZ 100-25 MG PO TABS   Oral   Take 1 tablet by mouth every morning.         Marland Kitchen METFORMIN HCL 500 MG PO TABS   Oral   Take 500 mg by mouth 2 (two) times daily.         Marland Kitchen SITAGLIPTIN PHOSPHATE 100 MG PO TABS   Oral   Take 100 mg by mouth every morning.           BP 128/86  Pulse 80  Temp 98.1 F (36.7 C) (Oral)  Resp 20  SpO2 100%  Physical Exam  Constitutional: She is oriented to person, place, and time. She appears well-developed.  HENT:  Head: Normocephalic and atraumatic.  Eyes: Conjunctivae normal and EOM are  normal. No scleral icterus.  Neck: Neck supple. No thyromegaly present.  Cardiovascular: Normal rate and regular rhythm.  Exam reveals no gallop and no friction rub.   No murmur heard. Pulmonary/Chest: No stridor. She has no wheezes. She has no rales. She exhibits no tenderness.  Abdominal: She exhibits no distension. There is tenderness. There is no rebound.  Musculoskeletal: Normal range of motion. She exhibits no edema.  Lymphadenopathy:    She has no cervical adenopathy.  Neurological: She is oriented to person, place, and time. Coordination normal.  Skin: No rash noted. No erythema.  Psychiatric: She has a normal mood and affect. Her behavior is normal.    ED Course  Procedures (including critical care time)  Labs Reviewed  CBC WITH DIFFERENTIAL - Abnormal; Notable for the following:    Platelets 126 (*)      Lymphocytes Relative 50 (*)     All other components within normal limits  COMPREHENSIVE METABOLIC PANEL - Abnormal; Notable for the following:    Glucose, Bld 155 (*)     Albumin 3.3 (*)     AST 40 (*)     ALT 40 (*)     GFR calc non Af Amer 86 (*)     All other components within normal limits  LIPASE, BLOOD - Abnormal; Notable for the following:    Lipase 489 (*)     All other components within normal limits   Ct Abdomen Pelvis W Contrast  12/14/2012  *RADIOLOGY REPORT*  Clinical Data: Abdominal pain  CT ABDOMEN AND PELVIS WITH CONTRAST  Technique:  Multidetector CT imaging of the abdomen and pelvis was performed following the standard protocol during bolus administration of intravenous contrast.  Contrast: OMNIPAQUE IOHEXOL 300 MG/ML  SOLN  Comparison: None.  Findings: Several gallstones are noted.  Mild gallbladder wall thickening.  Fat planes within the head of the pancreas are blurred.  There is subtle stranding about the pancreatic head.  Findings are suspicious for acute pancreatitis.  14 mm hypodensity is present in the head of the pancreas which is indeterminate.  See image 21.  Diffuse hepatic steatosis.  The left lobe of the liver is enlarged with a nodular contour compatible with cirrhosis.  Spleen, right adrenal gland, right kidney are within normal limits. Inflammatory changes of the left adrenal gland are suspected. Simple cyst in the lower pole of the left kidney.  Normal appendix.  Sigmoid diverticulosis without evidence of acute diverticulitis.  Unremarkable bladder.  Uterus and adnexa are within normal limits.  Bilateral inguinal hernia containing adipose tissue.  Right gonadal vein is prominent and dilated.  IMPRESSION: Cholelithiasis.  Mild gallbladder wall thickening is nonspecific.  Cirrhotic liver.  Findings suggesting acute pancreatitis involving the head of the pancreas.  Indeterminate 14 mm hypodensity in the head of the pancreas. Neoplasm is not excluded.  At the  minimum, short-term 74-month follow-up CT can be performed to reassess.  Sigmoid diverticulosis without evidence of acute diverticulitis.   Original Report Authenticated By: Jolaine Click, M.D.      No diagnosis found.    MDM          Benny Lennert, MD 12/14/12 1540

## 2012-12-14 NOTE — Progress Notes (Signed)
[facility] ED CM noted CM consult for Trouble affording meds. Please provide available resources CM spoke with pt who confirms she is having trouble affording some of her medications especially her JANUVIA Confirms she is not in the "donut hole" CM discussed with pt [email or web address] and having pcp to change medication to a more cost effective medication for her medicare plan or to provide samples.  Discussed outpatient pharmacies [email or web address] contains a coupon but states "The coupon is not valid if you are covered under Medicaid, Medicare, a Medicare Part D or Medicare Advantage plan (regardless of whether a specific prescription is covered), TRICARE, [place], [place] Government [employer] ("Healthcare Reform"), any qualified health plan purchased through a health insurance exchange established by a state government or [employer], or any other state or Arts administrator (collectively, H[employer], commercial Programs"). You are not eligible to participate in this program. If you would like to discuss further please call [phone]. Pt provided with [employer] company information

## 2012-12-14 NOTE — ED Notes (Signed)
Report called to floor nurse unavailable at this i was told she will call me back

## 2012-12-14 NOTE — Consult Note (Signed)
Referring Provider: No ref. provider found Primary Care Physician:  [name], MD Primary Gastroenterologist:  Dr. Juanda Chance  Reason for Consultation:  Pancreatitis  HPI: Victoria [name] is a 71 y.o. female with PMH of breast cancer s/p lumpectomy, DM, HTN, and partial thyroidectomy presenting with complaints of upper abdominal pain and bloating.  Says that her symptoms began a few weeks ago (around Thanksgiving), but have been worse over the last couple of days.  Admits to nausea but no vomiting.  Says that the pain actually seems better with eating, which is not typical of pancreatitis.  Complains of heartburn/reflux type symptoms as well.  No diarrhea, dark or bloody stool, fever, or chills.    Lipase was elevated to 489 in the ED.  CT scan should pancreatitis at the head of the pancreas.  Also showed cirrhotic liver, which patient is unaware of.  Denies ETOH abuse/use.  CT also reported a 14 mm hypodensity at the head of the pancreas.  Has cholelithiasis, but no sign of biliary dilation or elevated LFT pattern to suggest obstruction.  Triglycerides were normal at 88 in October of this year.  She does take Januvia and hyzaar, which both have pancreatitis listed as a severe adverse reaction, however, these medications are not new and she has not been taking the Januvia for a couple of weeks because it is expensive and she ran out of samples.  No history of pancreatitis previously.     Past Medical History  Diagnosis Date  . Hypertension   . Cancer     breast  . Diabetes mellitus   . Abnormal finding on Pap smear   . Obesity     Past Surgical History  Procedure Date  . Thyroidectomy, partial 1972    left  . Breast reduction surgery 2005  . Cataract extraction, bilateral   . Breast lumpectomy right    Prior to Admission medications   Medication Sig Start Date End Date Taking? Authorizing Provider  amLODipine (NORVASC) 2.5 MG tablet Take 2.5 mg by mouth at bedtime.   Yes Historical  Provider, MD  ibuprofen (ADVIL,MOTRIN) 200 MG tablet Take 400 mg by mouth every 6 (six) hours as needed. For pain   Yes Historical Provider, MD  losartan-hydrochlorothiazide (HYZAAR) 100-25 MG per tablet Take 1 tablet by mouth every morning.   Yes Historical Provider, MD  metFORMIN (GLUCOPHAGE) 500 MG tablet Take 500 mg by mouth 2 (two) times daily.   Yes Historical Provider, MD  sitaGLIPtin (JANUVIA) 100 MG tablet Take 100 mg by mouth every morning.   Yes Historical Provider, MD    No current facility-administered medications for this encounter.   Current Outpatient Prescriptions  Medication Sig Dispense Refill  . amLODipine (NORVASC) 2.5 MG tablet Take 2.5 mg by mouth at bedtime.      Marland Kitchen ibuprofen (ADVIL,MOTRIN) 200 MG tablet Take 400 mg by mouth every 6 (six) hours as needed. For pain      . losartan-hydrochlorothiazide (HYZAAR) 100-25 MG per tablet Take 1 tablet by mouth every morning.      . metFORMIN (GLUCOPHAGE) 500 MG tablet Take 500 mg by mouth 2 (two) times daily.      . sitaGLIPtin (JANUVIA) 100 MG tablet Take 100 mg by mouth every morning.        Allergies as of 12/14/2012 - Review Complete 12/14/2012  Allergen Reaction Noted  . Lisinopril Cough 10/01/2011    Family History  Problem Relation Age of Onset  . Diabetes Mother   .  Stroke Mother     History   Social History  . Marital Status: Married    Spouse Name: N/A    Number of Children: N/A  . Years of Education: N/A   Occupational History  . Not on file.   Social History Main Topics  . Smoking status: Never Smoker   . Smokeless tobacco: Never Used  . Alcohol Use: No  . Drug Use: No  . Sexually Active: Not on file   Other Topics Concern  . Not on file   Social History Narrative  . No narrative on file    Review of Systems: Ten point ROS is O/W negative except as mentioned in HPI.  Physical Exam: Vital signs in last 24 hours: Temp:  [98.1 F (36.7 C)] 98.1 F (36.7 C) (12/23 1043) Pulse Rate:   [80] 80  (12/23 1043) Resp:  [20] 20  (12/23 1043) BP: (128)/(86) 128/86 mmHg (12/23 1043) SpO2:  [100 %] 100 % (12/23 1043)   General:   Alert, Well-developed, well-nourished, pleasant and cooperative in NAD. Head:  Normocephalic and atraumatic. Eyes:  Sclera clear, no icterus.  Conjunctiva pink. Ears:  Normal auditory acuity. Mouth:  No deformity or lesions.   Neck:  Supple; no masses.  Scar from thyroidectomy is present. Lungs:  Clear throughout to auscultation.   No wheezes, crackles, or rhonchi.  Heart:  Regular rate and rhythm; no murmurs, clicks, rubs,  or gallops. Abdomen:  Soft, non-distended, BS active, minimal upper abdominal TTP without R/R/G.   Rectal:  Deferred  Msk:  Symmetrical without gross deformities. Pulses:  Normal pulses noted. Extremities:  Without clubbing or edema. Neurologic:  Alert and  oriented x4;  grossly normal neurologically. Skin:  Intact without significant lesions or rashes.  Appears dry. Psych:  Alert and cooperative. Normal mood and affect.  Lab Results:  [facility] 12/14/12 1150  WBC 7.4  HGB 13.7  HCT 39.4  PLT 126*   BMET  Basename 12/14/12 1150  NA 135  K 4.0  CL 99  CO2 29  GLUCOSE 155*  BUN 15  CREATININE 0.70  CALCIUM 9.4   LFT  Basename 12/14/12 1150  PROT 7.4  ALBUMIN 3.3*  AST 40*  ALT 40*  ALKPHOS 64  BILITOT 0.5  BILIDIR --  IBILI --   Studies/Results: Ct Abdomen Pelvis W Contrast  12/14/2012  *RADIOLOGY REPORT*  Clinical Data: Abdominal pain  CT ABDOMEN AND PELVIS WITH CONTRAST  Technique:  Multidetector CT imaging of the abdomen and pelvis was performed following the standard protocol during bolus administration of intravenous contrast.  Contrast: OMNIPAQUE IOHEXOL 300 MG/ML  SOLN  Comparison: None.  Findings: Several gallstones are noted.  Mild gallbladder wall thickening.  Fat planes within the head of the pancreas are blurred.  There is subtle stranding about the pancreatic head.  Findings are  suspicious for acute pancreatitis.  14 mm hypodensity is present in the head of the pancreas which is indeterminate.  See image 21.  Diffuse hepatic steatosis.  The left lobe of the liver is enlarged with a nodular contour compatible with cirrhosis.  Spleen, right adrenal gland, right kidney are within normal limits. Inflammatory changes of the left adrenal gland are suspected. Simple cyst in the lower pole of the left kidney.  Normal appendix.  Sigmoid diverticulosis without evidence of acute diverticulitis.  Unremarkable bladder.  Uterus and adnexa are within normal limits.  Bilateral inguinal hernia containing adipose tissue.  Right gonadal vein is prominent and dilated.  IMPRESSION: Cholelithiasis.  Mild gallbladder wall thickening is nonspecific.  Cirrhotic liver.  Findings suggesting acute pancreatitis involving the head of the pancreas.  Indeterminate 14 mm hypodensity in the head of the pancreas. Neoplasm is not excluded.  At the minimum, short-term 21-month follow-up CT can be performed to reassess.  Sigmoid diverticulosis without evidence of acute diverticulitis.   Original Report Authenticated By: Jolaine Click, M.D.     IMPRESSION:  -Pancreatitis:  Seems very mild at this point.  ? Source, microlithiasis vs medication induced januvia and hyzaar have pancreatitis listed as adverse effects, but she has not been taking the [place] for a couple of weeks) vs pancreatic divisum.  She does have gallstones, but does not have dilated bile ducts or elevated LFT's to suggest biliary obstruction.  Doubt autoimmune. -Possible lesion on the head of the pancreas -Finding of cirrhosis on CT:  Patient is unaware that there was a problem with her liver.  Denies ETOH abuse.  Possibly due to NASH, but this will likely need evaluation further.  Has thrombocytopenia.  PLAN: -IVF's, clear liquids, pain control, anti-emetics. -Final decisions regarding evaluation per Dr. Arlyce Dice, but could include MRCP vs outpatient EUS  to evaluate lesion and for microlithiasis vs surgical consultation/evaluation for possible cholecystectomy. -Will likely need thorough serologic liver evaluation to rule out other causes of liver disease.    ZEHR, JESSICA D.  12/14/2012, 4:29 PM  Pager number [phone]  Chart was reviewed and patient was examined. X-rays were reviewed.    I agree with management and plans.  Pt has mild pancreatitis of uncertain etiology (meds versus stones).  She appears to have a nodular liver suggestive of cirrhosis.  Finally, there is a hypodense lesion in the head of the pancreas that requires further elucidation.  Recommend 1.  Vigorous hydration 2.  Serologies for hep b, c, Fe, tibc, ferritin levels 3.  MRCP/MRI to r/o choledocholithiasis and further clarify lesion in the pancreas.  Barbette Hair. Arlyce Dice, M.D., [facility] Cell [phone]

## 2012-12-14 NOTE — ED Notes (Addendum)
Pt reports generalized abd pain x1 wk. +nausea. Denies v/d. Hx gastritis. Has tried advil and OTC Prevacid at home without relief.

## 2012-12-15 ENCOUNTER — Other Ambulatory Visit (HOSPITAL_COMMUNITY): Payer: Medicare Other

## 2012-12-15 ENCOUNTER — Inpatient Hospital Stay (HOSPITAL_COMMUNITY): Payer: Medicare Other

## 2012-12-15 DIAGNOSIS — D696 Thrombocytopenia, unspecified: Secondary | ICD-10-CM

## 2012-12-15 DIAGNOSIS — I10 Essential (primary) hypertension: Secondary | ICD-10-CM | POA: Diagnosis not present

## 2012-12-15 DIAGNOSIS — E119 Type 2 diabetes mellitus without complications: Secondary | ICD-10-CM | POA: Diagnosis not present

## 2012-12-15 DIAGNOSIS — K859 Acute pancreatitis without necrosis or infection, unspecified: Principal | ICD-10-CM

## 2012-12-15 DIAGNOSIS — K8020 Calculus of gallbladder without cholecystitis without obstruction: Secondary | ICD-10-CM | POA: Diagnosis not present

## 2012-12-15 LAB — CBC
HCT: 36.3 % (ref 36.0–46.0)
Hemoglobin: 12.4 g/dL (ref 12.0–15.0)
MCH: 28.8 pg (ref 26.0–34.0)
MCHC: 34.2 g/dL (ref 30.0–36.0)
MCV: 84.2 fL (ref 78.0–100.0)
Platelets: 119 10*3/uL — ABNORMAL LOW (ref 150–400)
RBC: 4.31 MIL/uL (ref 3.87–5.11)
RDW: 13.8 % (ref 11.5–15.5)
WBC: 5.9 10*3/uL (ref 4.0–10.5)

## 2012-12-15 LAB — LIPID PANEL
Cholesterol: 111 mg/dL (ref 0–200)
HDL: 38 mg/dL — ABNORMAL LOW (ref >39)
LDL Cholesterol: 55 mg/dL (ref 0–99)
Total CHOL/HDL Ratio: 2.9 RATIO
Triglycerides: 92 mg/dL (ref <150)
VLDL: 18 mg/dL (ref 0–40)

## 2012-12-15 LAB — COMPREHENSIVE METABOLIC PANEL
ALT: 33 U/L (ref 0–35)
AST: 43 U/L — ABNORMAL HIGH (ref 0–37)
Albumin: 2.8 g/dL — ABNORMAL LOW (ref 3.5–5.2)
Alkaline Phosphatase: 43 U/L (ref 39–117)
BUN: 11 mg/dL (ref 6–23)
CO2: 28 mEq/L (ref 19–32)
Calcium: 8.5 mg/dL (ref 8.4–10.5)
Chloride: 102 mEq/L (ref 96–112)
Creatinine, Ser: 0.81 mg/dL (ref 0.50–1.10)
GFR calc Af Amer: 84 mL/min — ABNORMAL LOW (ref >90)
GFR calc non Af Amer: 72 mL/min — ABNORMAL LOW (ref >90)
Glucose, Bld: 102 mg/dL — ABNORMAL HIGH (ref 70–99)
Potassium: 4.2 mEq/L (ref 3.5–5.1)
Sodium: 136 mEq/L (ref 135–145)
Total Bilirubin: 0.5 mg/dL (ref 0.3–1.2)
Total Protein: 6.5 g/dL (ref 6.0–8.3)

## 2012-12-15 LAB — CERULOPLASMIN: Ceruloplasmin: 11 mg/dL — ABNORMAL LOW (ref 20–60)

## 2012-12-15 LAB — PROTIME-INR
INR: 1.16 (ref 0.00–1.49)
Prothrombin Time: 14.6 seconds (ref 11.6–15.2)

## 2012-12-15 LAB — IRON AND TIBC
Iron: 165 ug/dL — ABNORMAL HIGH (ref 42–135)
Saturation Ratios: 36 % (ref 20–55)
TIBC: 463 ug/dL (ref 250–470)
UIBC: 298 ug/dL (ref 125–400)

## 2012-12-15 LAB — FERRITIN: Ferritin: 515 ng/mL — ABNORMAL HIGH (ref 10–291)

## 2012-12-15 MED ORDER — ALBUTEROL SULFATE HFA 108 (90 BASE) MCG/ACT IN AERS
2.0000 | INHALATION_SPRAY | Freq: Four times a day (QID) | RESPIRATORY_TRACT | Status: DC
  Administered 2012-12-15: 2 via RESPIRATORY_TRACT
  Filled 2012-12-15: qty 6.7

## 2012-12-15 MED ORDER — LORAZEPAM 1 MG PO TABS
1.0000 mg | ORAL_TABLET | Freq: Three times a day (TID) | ORAL | Status: AC | PRN
  Administered 2012-12-15: 1 mg via ORAL
  Filled 2012-12-15: qty 1

## 2012-12-15 MED ORDER — GADOBENATE DIMEGLUMINE 529 MG/ML IV SOLN
20.0000 mL | Freq: Once | INTRAVENOUS | Status: AC | PRN
  Administered 2012-12-15: 20 mL via INTRAVENOUS

## 2012-12-15 NOTE — Progress Notes (Signed)
[facility] PROGRESS NOTE  Victoria [name] ZOX:[id number] DOB: Nov 27, 1941 DOA: 12/14/2012  PCP: [name], MD  Brief HPI: Victoria [name] is a 72 y.o. female with history of DM 2, HTN, breast cancer, partial thyroidectomy presented to ED on 12/23 with one week history of abdominal pain and bloating. She indicated that her bloating actually started approximately 2 weeks ago but since the last one week her symptoms have worsened. She complained of upper abdominal pain, intermittent, radiating around both sides to the back, sharp and 10/10 at its worst, worsened by hunger and relieved somewhat with by mouth intake and Nexium. She complained of intermittent bitter tasting fluid in the mouth. Nauseous with reduced by mouth intake. No vomiting, constipation or diarrhea. No fever or chills. No recent change in home medications or insect bites. She thought that these symptoms were due to gastritis which she had 7 years ago and tried over-the-counter Nexium without significant relief. She presented to the emergency department where her lipase is approximately 490 and CT abdomen shows cholelithiasis, mild gallbladder wall thickening, cirrhotic liver, findings suggesting acute pancreatitis involving the head of the pancreas, indeterminate 14 mm hypodensity in the head of the pancreas. neoplasm is not excluded and sigmoid diverticulosis without evidence of acute diverticulitis.   Past medical history:  Past Medical History  Diagnosis Date  . Hypertension   . Cancer     breast  . Diabetes mellitus   . Abnormal finding on Pap smear   . Obesity     Consultants: [facility]  Procedures: None  Antibiotics: None  Subjective: Patient had pain upto 5/10 and she took pain medication. Denies any nausea or vomiting. Tolerating liquids. Also admits to a cough ongoing for many days. Dry. No chest pain.  Objective: Vital Signs  Filed Vitals:   12/14/12 1043 12/14/12 1630 12/14/12 1938 12/15/12 0603   BP: 128/86 140/67 156/89 140/75  Pulse: 80 60 62 57  Temp: 98.1 F (36.7 C) 97.7 F (36.5 C) 97.8 F (36.6 C) 97.4 F (36.3 C)  TempSrc: Oral Oral Oral Oral  Resp: 20 18 18 18   Height:   5\' 8"  (1.727 m)   Weight:   108.4 kg (238 lb 15.7 oz)   SpO2: 100% 98% 94% 100%   No intake or output data in the 24 hours ending 12/15/12 1124 Filed Weights   12/14/12 1938  Weight: 108.4 kg (238 lb 15.7 oz)    Intake/Output from previous day:    General appearance: alert, cooperative, appears stated age, no distress and moderately obese Back: symmetric, no curvature. ROM normal. No CVA tenderness. Resp: clear to auscultation bilaterally Cardio: regular rate and rhythm, S1, S2 normal, no murmur, click, rub or gallop GI: soft, non-tender; bowel sounds normal; no masses,  no organomegaly Extremities: extremities normal, atraumatic, no cyanosis or edema Pulses: 2+ and symmetric Neurologic: Alert and oriented x 3. No focal deficits.  Lab Results:  Basic Metabolic Panel:  Lab 12/15/12 8119 12/14/12 1150  NA 136 135  K 4.2 4.0  CL 102 99  CO2 28 29  GLUCOSE 102* 155*  BUN 11 15  CREATININE 0.81 0.70  CALCIUM 8.5 9.4  MG -- --  PHOS -- --   Liver Function Tests:  Lab 12/15/12 0535 12/14/12 1150  AST 43* 40*  ALT 33 40*  ALKPHOS 43 64  BILITOT 0.5 0.5  PROT 6.5 7.4  ALBUMIN 2.8* 3.3*    Lab 12/14/12 1150  LIPASE 489*  AMYLASE --  CBC:  Lab 12/15/12 0535 12/14/12 1150  WBC 5.9 7.4  NEUTROABS -- 3.1  HGB 12.4 13.7  HCT 36.3 39.4  MCV 84.2 84.7  PLT 119* 126*    Studies/Results: Ct Abdomen Pelvis W Contrast  12/14/2012  *RADIOLOGY REPORT*  Clinical Data: Abdominal pain  CT ABDOMEN AND PELVIS WITH CONTRAST  Technique:  Multidetector CT imaging of the abdomen and pelvis was performed following the standard protocol during bolus administration of intravenous contrast.  Contrast: OMNIPAQUE IOHEXOL 300 MG/ML  SOLN  Comparison: None.  Findings: Several gallstones  are noted.  Mild gallbladder wall thickening.  Fat planes within the head of the pancreas are blurred.  There is subtle stranding about the pancreatic head.  Findings are suspicious for acute pancreatitis.  14 mm hypodensity is present in the head of the pancreas which is indeterminate.  See image 21.  Diffuse hepatic steatosis.  The left lobe of the liver is enlarged with a nodular contour compatible with cirrhosis.  Spleen, right adrenal gland, right kidney are within normal limits. Inflammatory changes of the left adrenal gland are suspected. Simple cyst in the lower pole of the left kidney.  Normal appendix.  Sigmoid diverticulosis without evidence of acute diverticulitis.  Unremarkable bladder.  Uterus and adnexa are within normal limits.  Bilateral inguinal hernia containing adipose tissue.  Right gonadal vein is prominent and dilated.  IMPRESSION: Cholelithiasis.  Mild gallbladder wall thickening is nonspecific.  Cirrhotic liver.  Findings suggesting acute pancreatitis involving the head of the pancreas.  Indeterminate 14 mm hypodensity in the head of the pancreas. Neoplasm is not excluded.  At the minimum, short-term 11-month follow-up CT can be performed to reassess.  Sigmoid diverticulosis without evidence of acute diverticulitis.   Original Report Authenticated By: Jolaine Click, M.D.     Medications:  Scheduled:   . albuterol  2 puff Inhalation QID  . amLODipine  2.5 mg Oral QHS  . enoxaparin (LOVENOX) injection  50 mg Subcutaneous Q24H  . pantoprazole (PROTONIX) IV  40 mg Intravenous QHS   Continuous:   . sodium chloride 75 mL/hr at 12/15/12 1124   JYN:[id number], acetaminophen, albuterol, hydrocerin, HYDROmorphone (DILAUDID) injection, ondansetron (ZOFRAN) IV, ondansetron, oxyCODONE  Assessment/Plan:  Principal Problem:  *Acute pancreatitis Active Problems:  Diabetes mellitus  Hypertension  History of breast cancer  Cholelithiasis  Thrombocytopenia  Nonspecific (abnormal)  findings on radiological and other examination of other intrathoracic organs  Cirrhosis    Acute pancreatitis  Unclear etiology. No history of alcohol abuse, hypertriglyceridemia (normal triglycerides in October 2014), or recent change in medications.? Biliary pancreatitis -minimally elevated transaminases and gallstones on CT abdomen. Also CT abdomen suggests indeterminate 14 mm hypodensity in head of pancreas which needs further evaluation.  Continue clear liquids, IV fluids, IV PPI and pain management.  TG level is normal.   [facility] is following and plan is for MRI/MRCP.  Type 2 diabetes mellitus  Patient has not taken her Januvia for 2 weeks since onset of symptoms.  Hemoglobin A1c in October was well controlled.  Continue sliding scale insulin and monitor.  Hypertension  Controlled.  Hold losartan-HCTZ. Continue amlodipine at bedtime and monitor.  Mild thrombocytopenia  Chronic and stable.  History of breast cancer  Status post right lumpectomy and radiation.  Said to be in remission.  Status post partial thyroidectomy  Clinically euthyroid  TSH in October 2013 was normal.  Cirrhosis on CT abdomen  Unclear etiology.  Cough Unclear etiology. Lungs are clear. Cough sounds wheezy. Try  albuterol inhaler.  DVT Prophylaxis Enoxaparin  Code Status: Full  Family Communication: Discussed with patient  Disposition Plan: Home when better   LOS: 1 day   [facility]  [facility] Pager [phone] 12/15/2012, 11:24 AM  If 8PM-8AM, please contact night-coverage at [email or web address], password [facility]

## 2012-12-15 NOTE — Progress Notes (Signed)
Chaplain saw pt on rounds.   Provided support around hospitalization and upcoming MRI.   Saurabh Hettich, [facility]  MDiv, chaplain

## 2012-12-15 NOTE — Progress Notes (Signed)
[facility] Progress Note  Subjective:  Does feel a little better.  Tolerating clears, but not ready to advance to full liquids yet.    Objective:  Vital signs in last 24 hours: Temp:  [97.4 F (36.3 C)-98.1 F (36.7 C)] 97.4 F (36.3 C) (12/24 0603) Pulse Rate:  [57-80] 57  (12/24 0603) Resp:  [18-20] 18  (12/24 0603) BP: (128-156)/(67-89) 140/75 mmHg (12/24 0603) SpO2:  [94 %-100 %] 100 % (12/24 0603) Weight:  [238 lb 15.7 oz (108.4 kg)] 238 lb 15.7 oz (108.4 kg) (12/23 1938) Last BM Date:  (sun or mon per patient) General:   Alert, Well-developed, in NAD Heart:  Regular rate and rhythm; no murmurs Pulm:  CTAB.  No W/R/R. Abdomen:  Soft, nondistended. Normal bowel sounds.  Mild upper abdominal TTP without guarding, and without rebound.   Extremities:  Without edema. Neurologic:  Alert and  oriented x4;  grossly normal neurologically. Psych:  Alert and cooperative. Normal mood and affect.  Lab Results:  Basename 12/15/12 0535 12/14/12 1150  WBC 5.9 7.4  HGB 12.4 13.7  HCT 36.3 39.4  PLT 119* 126*   BMET  Basename 12/15/12 0535 12/14/12 1150  NA 136 135  K 4.2 4.0  CL 102 99  CO2 28 29  GLUCOSE 102* 155*  BUN 11 15  CREATININE 0.81 0.70  CALCIUM 8.5 9.4   LFT  Basename 12/15/12 0535  PROT 6.5  ALBUMIN 2.8*  AST 43*  ALT 33  ALKPHOS 43  BILITOT 0.5  BILIDIR --  IBILI --   Ct Abdomen Pelvis W Contrast  12/14/2012  *RADIOLOGY REPORT*  Clinical Data: Abdominal pain  CT ABDOMEN AND PELVIS WITH CONTRAST  Technique:  Multidetector CT imaging of the abdomen and pelvis was performed following the standard protocol during bolus administration of intravenous contrast.  Contrast: OMNIPAQUE IOHEXOL 300 MG/ML  SOLN  Comparison: None.  Findings: Several gallstones are noted.  Mild gallbladder wall thickening.  Fat planes within the head of the pancreas are blurred.  There is subtle stranding about the pancreatic head.  Findings are suspicious for acute  pancreatitis.  14 mm hypodensity is present in the head of the pancreas which is indeterminate.  See image 21.  Diffuse hepatic steatosis.  The left lobe of the liver is enlarged with a nodular contour compatible with cirrhosis.  Spleen, right adrenal gland, right kidney are within normal limits. Inflammatory changes of the left adrenal gland are suspected. Simple cyst in the lower pole of the left kidney.  Normal appendix.  Sigmoid diverticulosis without evidence of acute diverticulitis.  Unremarkable bladder.  Uterus and adnexa are within normal limits.  Bilateral inguinal hernia containing adipose tissue.  Right gonadal vein is prominent and dilated.  IMPRESSION: Cholelithiasis.  Mild gallbladder wall thickening is nonspecific.  Cirrhotic liver.  Findings suggesting acute pancreatitis involving the head of the pancreas.  Indeterminate 14 mm hypodensity in the head of the pancreas. Neoplasm is not excluded.  At the minimum, short-term 53-month follow-up CT can be performed to reassess.  Sigmoid diverticulosis without evidence of acute diverticulitis.   Original Report Authenticated By: Jolaine Click, M.D.     Assessment / Plan: -Pancreatitis: Mild case. ? Source, microlithiasis vs medication induced januvia and hyzaar have pancreatitis listed as adverse effects, but she has not been taking the [place] for a couple of weeks) vs pancreatic divisum. She does have gallstones, but does not have dilated bile ducts or elevated LFT's to suggest biliary obstruction. Doubt autoimmune.  -  Possible lesion on the head of the pancreas  -Finding of cirrhosis on CT: Patient is unaware that there was a problem with her liver. Denies ETOH abuse. Possibly due to NASH, but this will likely need evaluation further. Has thrombocytopenia.    *IVF's, clear liquids (pt does not want to advance diet to full liquids right now), pain control, anti-emetics.  *Follow-up MRI/MRCP. *Follow-up liver serologies.    LOS: 1 day   ZEHR,  JESSICA D.  12/15/2012, 8:50 AM  Pager number [phone]  Unable to tolerate MRI (claustrophobia).  Will reschedule while premedicating her with ativan.

## 2012-12-16 DIAGNOSIS — R9389 Abnormal findings on diagnostic imaging of other specified body structures: Secondary | ICD-10-CM | POA: Diagnosis not present

## 2012-12-16 DIAGNOSIS — K859 Acute pancreatitis without necrosis or infection, unspecified: Secondary | ICD-10-CM | POA: Diagnosis not present

## 2012-12-16 LAB — CBC
HCT: 36.6 % (ref 36.0–46.0)
Hemoglobin: 12.4 g/dL (ref 12.0–15.0)
MCH: 28.6 pg (ref 26.0–34.0)
MCHC: 33.9 g/dL (ref 30.0–36.0)
MCV: 84.5 fL (ref 78.0–100.0)
Platelets: 110 10*3/uL — ABNORMAL LOW (ref 150–400)
RBC: 4.33 MIL/uL (ref 3.87–5.11)
RDW: 14.1 % (ref 11.5–15.5)
WBC: 5.2 10*3/uL (ref 4.0–10.5)

## 2012-12-16 LAB — HEPATITIS PANEL, ACUTE
HCV Ab: REACTIVE — AB
Hep A IgM: NEGATIVE
Hep B C IgM: NEGATIVE
Hepatitis B Surface Ag: NEGATIVE

## 2012-12-16 LAB — COMPREHENSIVE METABOLIC PANEL
ALT: 33 U/L (ref 0–35)
AST: 35 U/L (ref 0–37)
Albumin: 2.8 g/dL — ABNORMAL LOW (ref 3.5–5.2)
Alkaline Phosphatase: 47 U/L (ref 39–117)
BUN: 8 mg/dL (ref 6–23)
CO2: 29 mEq/L (ref 19–32)
Calcium: 8.5 mg/dL (ref 8.4–10.5)
Chloride: 104 mEq/L (ref 96–112)
Creatinine, Ser: 0.78 mg/dL (ref 0.50–1.10)
GFR calc Af Amer: >90 mL/min (ref >90)
GFR calc non Af Amer: 83 mL/min — ABNORMAL LOW (ref >90)
Glucose, Bld: 100 mg/dL — ABNORMAL HIGH (ref 70–99)
Potassium: 3.3 mEq/L — ABNORMAL LOW (ref 3.5–5.1)
Sodium: 139 mEq/L (ref 135–145)
Total Bilirubin: 0.6 mg/dL (ref 0.3–1.2)
Total Protein: 6.7 g/dL (ref 6.0–8.3)

## 2012-12-16 LAB — BASIC METABOLIC PANEL
BUN: 7 mg/dL (ref 6–23)
CO2: 26 mEq/L (ref 19–32)
Calcium: 8.4 mg/dL (ref 8.4–10.5)
Chloride: 100 mEq/L (ref 96–112)
Creatinine, Ser: 0.67 mg/dL (ref 0.50–1.10)
GFR calc Af Amer: >90 mL/min (ref >90)
GFR calc non Af Amer: 88 mL/min — ABNORMAL LOW (ref >90)
Glucose, Bld: 153 mg/dL — ABNORMAL HIGH (ref 70–99)
Potassium: 3.2 mEq/L — ABNORMAL LOW (ref 3.5–5.1)
Sodium: 136 mEq/L (ref 135–145)

## 2012-12-16 LAB — LIPASE, BLOOD: Lipase: 131 U/L — ABNORMAL HIGH (ref 11–59)

## 2012-12-16 LAB — ANA: Anti Nuclear Antibody(ANA): NEGATIVE

## 2012-12-16 MED ORDER — ALBUTEROL SULFATE HFA 108 (90 BASE) MCG/ACT IN AERS
2.0000 | INHALATION_SPRAY | Freq: Four times a day (QID) | RESPIRATORY_TRACT | Status: DC | PRN
  Filled 2012-12-16: qty 6.7

## 2012-12-16 MED ORDER — POTASSIUM CHLORIDE 10 MEQ/100ML IV SOLN
10.0000 meq | INTRAVENOUS | Status: AC
  Administered 2012-12-16 (×3): 10 meq via INTRAVENOUS
  Filled 2012-12-16 (×4): qty 100

## 2012-12-16 MED ORDER — HYDROXYZINE HCL 25 MG PO TABS
25.0000 mg | ORAL_TABLET | Freq: Three times a day (TID) | ORAL | Status: DC | PRN
  Administered 2012-12-16 – 2012-12-18 (×3): 25 mg via ORAL
  Filled 2012-12-16: qty 1

## 2012-12-16 MED ORDER — POTASSIUM CHLORIDE 10 MEQ/100ML IV SOLN
10.0000 meq | Freq: Once | INTRAVENOUS | Status: AC
  Administered 2012-12-16: 10 meq via INTRAVENOUS
  Filled 2012-12-16: qty 100

## 2012-12-16 MED ORDER — MAGNESIUM SULFATE 40 MG/ML IJ SOLN
2.0000 g | Freq: Once | INTRAMUSCULAR | Status: AC
  Administered 2012-12-16: 2 g via INTRAVENOUS
  Filled 2012-12-16: qty 50

## 2012-12-16 NOTE — Progress Notes (Signed)
Critical Lab Value: Potassium 3.3, will pass on to new nurse and oncoming Md.

## 2012-12-16 NOTE — Progress Notes (Signed)
Patient up in chair, now resting in bed, No pain, tolerating last run of potassium, will continue to monitor

## 2012-12-16 NOTE — Progress Notes (Signed)
Patient expressed that she needed some information about her condition, I printed off several items including gastritis and pancreatitis, I also explained that the staff was doing everything possible to help her get better, I gave pain medication at 2100, She would like some info about alternatives to pain medication and her outlook as well as progression from this problem, will pass on to Md, will continue to monitor

## 2012-12-16 NOTE — Progress Notes (Addendum)
[facility] Progress Note  Subjective: *Pain is improved although she continues to require narcotics.**  Objective:  Vital signs in last 24 hours: Temp:  [97.5 F (36.4 C)-98.3 F (36.8 C)] 97.5 F (36.4 C) (12/25 0541) Pulse Rate:  [53-57] 53  (12/25 0541) Resp:  [18] 18  (12/25 0541) BP: (134-144)/(77-89) 136/80 mmHg (12/25 0541) SpO2:  [94 %-97 %] 97 % (12/25 0541) Last BM Date:  (sun or mon per patient) General:   Alert,  Well-developed,  white female in NAD Heart:  Regular rate and rhythm; no murmurs Abdomen:  Soft, nontender and nondistended. Normal bowel sounds, without guarding, and without rebound.   Extremities:  Without edema. Neurologic:  Alert and  oriented x4;  grossly normal neurologically. Psych:  Alert and cooperative. Normal mood and affect.  Intake/Output from previous day: 12/24 0701 - 12/25 0700 In: 240 [P.O.:240] Out: -  Intake/Output this shift:    Lab Results:  Basename 12/16/12 0510 12/15/12 0535 12/14/12 1150  WBC 5.2 5.9 7.4  HGB 12.4 12.4 13.7  HCT 36.6 36.3 39.4  PLT 110* 119* 126*   BMET  Basename 12/16/12 0510 12/15/12 0535 12/14/12 1150  NA 139 136 135  K 3.3* 4.2 4.0  CL 104 102 99  CO2 29 28 29   GLUCOSE 100* 102* 155*  BUN 8 11 15   CREATININE 0.78 0.81 0.70  CALCIUM 8.5 8.5 9.4   LFT  Basename 12/16/12 0510  PROT 6.7  ALBUMIN 2.8*  AST 35  ALT 33  ALKPHOS 47  BILITOT 0.6  BILIDIR --  IBILI --   PT/INR  Basename 12/15/12 0957  LABPROT 14.6  INR 1.16   Hepatitis Panel  Basename 12/15/12 0957  HEPBSAG NEGATIVE  HCVAB Reactive*  HEPAIGM PENDING  HEPBIGM PENDING    Studies/Results: Ct Abdomen Pelvis W Contrast  12/14/2012  *RADIOLOGY REPORT*  Clinical Data: Abdominal pain  CT ABDOMEN AND PELVIS WITH CONTRAST  Technique:  Multidetector CT imaging of the abdomen and pelvis was performed following the standard protocol during bolus administration of intravenous contrast.  Contrast: OMNIPAQUE  IOHEXOL 300 MG/ML  SOLN  Comparison: None.  Findings: Several gallstones are noted.  Mild gallbladder wall thickening.  Fat planes within the head of the pancreas are blurred.  There is subtle stranding about the pancreatic head.  Findings are suspicious for acute pancreatitis.  14 mm hypodensity is present in the head of the pancreas which is indeterminate.  See image 21.  Diffuse hepatic steatosis.  The left lobe of the liver is enlarged with a nodular contour compatible with cirrhosis.  Spleen, right adrenal gland, right kidney are within normal limits. Inflammatory changes of the left adrenal gland are suspected. Simple cyst in the lower pole of the left kidney.  Normal appendix.  Sigmoid diverticulosis without evidence of acute diverticulitis.  Unremarkable bladder.  Uterus and adnexa are within normal limits.  Bilateral inguinal hernia containing adipose tissue.  Right gonadal vein is prominent and dilated.  IMPRESSION: Cholelithiasis.  Mild gallbladder wall thickening is nonspecific.  Cirrhotic liver.  Findings suggesting acute pancreatitis involving the head of the pancreas.  Indeterminate 14 mm hypodensity in the head of the pancreas. Neoplasm is not excluded.  At the minimum, short-term 33-month follow-up CT can be performed to reassess.  Sigmoid diverticulosis without evidence of acute diverticulitis.   Original Report Authenticated By: Jolaine Click, M.D.    Mr 3d Recon At Scanner  12/16/2012  *RADIOLOGY REPORT*  Clinical Data:  Pancreatitis, possible pancreatic head  mass on prior exam  MRI ABDOMEN WITHOUT AND WITH CONTRAST (MRCP)  Technique:  Multiplanar multisequence MR imaging of the abdomen was performed without and with contrast, including heavily T2-weighted images of the biliary and pancreatic ducts.  Three-dimensional MR images were rendered by post processing of the original MR data.  Contrast: 20mL MULTIHANCE GADOBENATE DIMEGLUMINE 529 MG/ML IV SOLN  Comparison:  CT 12/14/2012  Findings:   Trace perihepatic ascites is again noted.  Gallstones are noted without gallbladder wall thickening or pericholecystic edema.  1.9 cm left lower renal pole cortical cyst again noted and two sub centimeter T2 hyperintense right upper and mid cortical lesions are most likely cysts as well.  The liver contour is minimally nodular.  Small fat containing right Bochdalek hernia. Adrenal glands and spleen are normal.  No pancreatic, intrahepatic, or common ductal dilatation is identified. No intrapancreatic edema or peripancreatic fluid.  In the pancreatic head, there is a 9 mm T2 hyperintense structure immediately adjacent to the common duct.  This does not enhance after administration of contrast.  There is otherwise symmetric enhancement of the pancreatic parenchyma. Several non enhancing oval and round lesions measuring 4 mm or less in the posterior segment right hepatic lobe are identified.  These are not T2 hyperintense or otherwise identified on other series but most likely represent tiny cysts or hamartomas.  No enhancing hepatic, pancreatic, splenic, adrenal, or renal masses identified.  No lymphadenopathy.  IMPRESSION: T2 hyperintense nonenhancing pancreatic head lesion demonstrates imaging characteristics and location most typical for an intraductal papillary mucinous neoplasm.  Differential considerations could include pseudocyst given the history of pancreatitis, or congenital pancreatic cyst.  Follow-up MRI in 6-12 months could be considered.   Original Report Authenticated By: Christiana Pellant, M.D.    Mr Abd W/wo Cm/mrcp  12/16/2012  *RADIOLOGY REPORT*  Clinical Data:  Pancreatitis, possible pancreatic head mass on prior exam  MRI ABDOMEN WITHOUT AND WITH CONTRAST (MRCP)  Technique:  Multiplanar multisequence MR imaging of the abdomen was performed without and with contrast, including heavily T2-weighted images of the biliary and pancreatic ducts.  Three-dimensional MR images were rendered by post  processing of the original MR data.  Contrast: 20mL MULTIHANCE GADOBENATE DIMEGLUMINE 529 MG/ML IV SOLN  Comparison:  CT 12/14/2012  Findings:  Trace perihepatic ascites is again noted.  Gallstones are noted without gallbladder wall thickening or pericholecystic edema.  1.9 cm left lower renal pole cortical cyst again noted and two sub centimeter T2 hyperintense right upper and mid cortical lesions are most likely cysts as well.  The liver contour is minimally nodular.  Small fat containing right Bochdalek hernia. Adrenal glands and spleen are normal.  No pancreatic, intrahepatic, or common ductal dilatation is identified. No intrapancreatic edema or peripancreatic fluid.  In the pancreatic head, there is a 9 mm T2 hyperintense structure immediately adjacent to the common duct.  This does not enhance after administration of contrast.  There is otherwise symmetric enhancement of the pancreatic parenchyma. Several non enhancing oval and round lesions measuring 4 mm or less in the posterior segment right hepatic lobe are identified.  These are not T2 hyperintense or otherwise identified on other series but most likely represent tiny cysts or hamartomas.  No enhancing hepatic, pancreatic, splenic, adrenal, or renal masses identified.  No lymphadenopathy.  IMPRESSION: T2 hyperintense nonenhancing pancreatic head lesion demonstrates imaging characteristics and location most typical for an intraductal papillary mucinous neoplasm.  Differential considerations could include pseudocyst given the history of pancreatitis, or congenital  pancreatic cyst.  Follow-up MRI in 6-12 months could be considered.   Original Report Authenticated By: Christiana Pellant, M.D.      Assessment / Plan: *1.  Acute pancreatitis. Clinically resolving. Etiology is still uncertain. All stones are present in she may have microlithiasis causing pancreatitis. Medications are another possibility. 2.  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 This is probably incidental to pancreatitis. 3.  possible cirrhosis   Recommendations 1.  would not advance diet until patient is off narcotics 2.  to consider  EUS for further delineation of pancreatic lesion*and to rule out choledocholithiasis 3.   await hepatitis serologies    Principal Problem:  *Acute pancreatitis Active Problems:  Diabetes mellitus  Hypertension  History of breast cancer  Cholelithiasis  Thrombocytopenia  Nonspecific (abnormal) findings on radiological and other examination of other intrathoracic organs  Cirrhosis     LOS: 2 days   Victoria [name]  12/16/2012, 11:51 AM

## 2012-12-16 NOTE — Progress Notes (Signed)
[facility] PROGRESS NOTE  SHATONIA HOOTS ZOX:[id number] DOB: 01/20/1941 DOA: 12/14/2012  PCP: [name], MD  Brief HPI: Victoria [name] is a 71 y.o. female with history of DM 2, HTN, breast cancer, partial thyroidectomy presented to ED on 12/23 with one week history of abdominal pain and bloating. She indicated that her bloating actually started approximately 2 weeks ago but since the last one week her symptoms have worsened. She complained of upper abdominal pain, intermittent, radiating around both sides to the back, sharp and 10/10 at its worst, worsened by hunger and relieved somewhat with by mouth intake and Nexium. She complained of intermittent bitter tasting fluid in the mouth. Nauseous with reduced by mouth intake. No vomiting, constipation or diarrhea. No fever or chills. No recent change in home medications or insect bites. She thought that these symptoms were due to gastritis which she had 7 years ago and tried over-the-counter Nexium without significant relief. She presented to the emergency department where her lipase is approximately 490 and CT abdomen shows cholelithiasis, mild gallbladder wall thickening, cirrhotic liver, findings suggesting acute pancreatitis involving the head of the pancreas, indeterminate 14 mm hypodensity in the head of the pancreas. neoplasm is not excluded and sigmoid diverticulosis without evidence of acute diverticulitis.   Past medical history:  Past Medical History  Diagnosis Date  . Hypertension   . Cancer     breast  . Diabetes mellitus   . Abnormal finding on Pap smear   . Obesity     Consultants: [facility]  Procedures: None  Antibiotics: None  Subjective: Pain is much better as she just received pain medications. She has tolerated liquid diet well without any nausea or vomiting and wants to start solid food. Denies any nausea or vomiting. No bowel movement since admission.  Objective: Vital Signs  Filed Vitals:   12/15/12  1230 12/15/12 1338 12/15/12 2117 12/16/12 0541  BP:  144/89 134/77 136/80  Pulse:  54 57 53  Temp:  98.3 F (36.8 C) 98.1 F (36.7 C) 97.5 F (36.4 C)  TempSrc:  Oral Oral Oral  Resp:  18 18 18   Height:      Weight:      SpO2: 97% 94% 97% 97%    Intake/Output Summary (Last 24 hours) at 12/16/12 0748 Last data filed at 12/15/12 1603  Gross per 24 hour  Intake    240 ml  Output      0 ml  Net    240 ml   Filed Weights   12/14/12 1938  Weight: 238 lb 15.7 oz (108.4 kg)    Intake/Output from previous day: 12/24 0701 - 12/25 0700 In: 240 [P.O.:240] Out: -   General appearance: alert, cooperative, appears stated age, no distress and moderately obese Back: symmetric, no curvature. ROM normal. No CVA tenderness. Resp: clear to auscultation bilaterally Cardio: regular rate and rhythm, S1, S2 normal, no murmur, click, rub or gallop GI: soft, non-tender; bowel sounds normal; no masses,  no organomegaly Extremities: extremities normal, atraumatic, no cyanosis or edema Pulses: 2+ and symmetric Neurologic: Alert and oriented x 3. No focal deficits.  Lab Results:  Basic Metabolic Panel:  Lab 12/16/12 8119 12/15/12 0535 12/14/12 1150  NA 139 136 135  K 3.3* 4.2 4.0  CL 104 102 99  CO2 29 28 29   GLUCOSE 100* 102* 155*  BUN 8 11 15   CREATININE 0.78 0.81 0.70  CALCIUM 8.5 8.5 9.4  MG -- -- --  PHOS -- -- --  Liver Function Tests:  Lab 12/16/12 0510 12/15/12 0535 12/14/12 1150  AST 35 43* 40*  ALT 33 33 40*  ALKPHOS 47 43 64  BILITOT 0.6 0.5 0.5  PROT 6.7 6.5 7.4  ALBUMIN 2.8* 2.8* 3.3*    Lab 12/16/12 0510 12/14/12 1150  LIPASE 131* 489*  AMYLASE -- --   CBC:  Lab 12/16/12 0510 12/15/12 0535 12/14/12 1150  WBC 5.2 5.9 7.4  NEUTROABS -- -- 3.1  HGB 12.4 12.4 13.7  HCT 36.6 36.3 39.4  MCV 84.5 84.2 84.7  PLT 110* 119* 126*    Studies/Results: Ct Abdomen Pelvis W Contrast  12/14/2012  *RADIOLOGY REPORT*  Clinical Data: Abdominal pain  CT ABDOMEN AND  PELVIS WITH CONTRAST  Technique:  Multidetector CT imaging of the abdomen and pelvis was performed following the standard protocol during bolus administration of intravenous contrast.  Contrast: OMNIPAQUE IOHEXOL 300 MG/ML  SOLN  Comparison: None.  Findings: Several gallstones are noted.  Mild gallbladder wall thickening.  Fat planes within the head of the pancreas are blurred.  There is subtle stranding about the pancreatic head.  Findings are suspicious for acute pancreatitis.  14 mm hypodensity is present in the head of the pancreas which is indeterminate.  See image 21.  Diffuse hepatic steatosis.  The left lobe of the liver is enlarged with a nodular contour compatible with cirrhosis.  Spleen, right adrenal gland, right kidney are within normal limits. Inflammatory changes of the left adrenal gland are suspected. Simple cyst in the lower pole of the left kidney.  Normal appendix.  Sigmoid diverticulosis without evidence of acute diverticulitis.  Unremarkable bladder.  Uterus and adnexa are within normal limits.  Bilateral inguinal hernia containing adipose tissue.  Right gonadal vein is prominent and dilated.  IMPRESSION: Cholelithiasis.  Mild gallbladder wall thickening is nonspecific.  Cirrhotic liver.  Findings suggesting acute pancreatitis involving the head of the pancreas.  Indeterminate 14 mm hypodensity in the head of the pancreas. Neoplasm is not excluded.  At the minimum, short-term 17-month follow-up CT can be performed to reassess.  Sigmoid diverticulosis without evidence of acute diverticulitis.   Original Report Authenticated By: Jolaine Click, M.D.    MRCP done 12/24: Results pending   Medications:  Scheduled:    . amLODipine  2.5 mg Oral QHS  . enoxaparin (LOVENOX) injection  50 mg Subcutaneous Q24H  . magnesium sulfate 1 - 4 g bolus IVPB  2 g Intravenous Once  . pantoprazole (PROTONIX) IV  40 mg Intravenous QHS  . potassium chloride  10 mEq Intravenous Q1 Hr x 4    Continuous:    . sodium chloride 75 mL/hr at 12/15/12 1124   WJX:[id number], acetaminophen, albuterol, hydrocerin, HYDROmorphone (DILAUDID) injection, ondansetron (ZOFRAN) IV, ondansetron, oxyCODONE  Assessment/Plan:  Principal Problem:  *Acute pancreatitis Active Problems:  Diabetes mellitus  Hypertension  History of breast cancer  Cholelithiasis  Thrombocytopenia  Nonspecific (abnormal) findings on radiological and other examination of other intrathoracic organs  Cirrhosis    Acute pancreatitis  Unclear etiology. No history of alcohol abuse, hypertriglyceridemia (normal triglycerides in October 2014), or recent change in medications. Biliary pancreatitis - minimally elevated transaminases and gallstones on CT abdomen. Also CT abdomen suggests indeterminate 14 mm hypodensity in head of pancreas which was evaluated by MRCP results pending at this time Continue clear liquids, IV fluids, IV PPI and pain management.  TG level is normal.   [place] [facility] is following. Advance diet as tolerated today.  Type 2 diabetes mellitus  Patient has not taken her Januvia for 2 weeks since onset of symptoms.  Hemoglobin A1c in October was well controlled.  Continue sliding scale insulin and monitor.  Hypertension  Controlled.  Hold losartan-HCTZ. Continue amlodipine at bedtime and monitor.  Mild thrombocytopenia  Chronic and stable.  History of breast cancer  Status post right lumpectomy and radiation.  Said to be in remission.  Status post partial thyroidectomy  Clinically euthyroid  TSH in October 2013 was normal.  Cirrhosis on CT abdomen  Unclear etiology.  Cough Unclear etiology. Lungs are clear. Try albuterol inhaler.  DVT Prophylaxis Enoxaparin  Code Status: Full  Family Communication: Discussed with patient  Disposition Plan: Home when better per GI   LOS: 2 days   Lars Mage  [facility] Pager [phone] 12/16/2012, 7:48 AM  If  8PM-8AM, please contact night-coverage at [email or web address], password [facility]

## 2012-12-17 ENCOUNTER — Telehealth: Payer: Self-pay | Admitting: Gastroenterology

## 2012-12-17 DIAGNOSIS — K859 Acute pancreatitis without necrosis or infection, unspecified: Secondary | ICD-10-CM | POA: Diagnosis not present

## 2012-12-17 LAB — HEPATITIS PANEL, ACUTE
HCV Ab: REACTIVE — AB
Hep A IgM: NEGATIVE
Hep B C IgM: NEGATIVE
Hepatitis B Surface Ag: NEGATIVE

## 2012-12-17 LAB — MITOCHONDRIAL ANTIBODIES: Mitochondrial M2 Ab, IgG: 0.28 (ref <0.91)

## 2012-12-17 LAB — BASIC METABOLIC PANEL
BUN: 6 mg/dL (ref 6–23)
CO2: 27 mEq/L (ref 19–32)
Calcium: 8.2 mg/dL — ABNORMAL LOW (ref 8.4–10.5)
Chloride: 102 mEq/L (ref 96–112)
Creatinine, Ser: 0.66 mg/dL (ref 0.50–1.10)
GFR calc Af Amer: >90 mL/min (ref >90)
GFR calc non Af Amer: 88 mL/min — ABNORMAL LOW (ref >90)
Glucose, Bld: 189 mg/dL — ABNORMAL HIGH (ref 70–99)
Potassium: 3.5 mEq/L (ref 3.5–5.1)
Sodium: 136 mEq/L (ref 135–145)

## 2012-12-17 LAB — LIPASE, BLOOD: Lipase: 93 U/L — ABNORMAL HIGH (ref 11–59)

## 2012-12-17 MED ORDER — LOSARTAN POTASSIUM 50 MG PO TABS
100.0000 mg | ORAL_TABLET | Freq: Every day | ORAL | Status: DC
  Administered 2012-12-17 – 2012-12-18 (×2): 100 mg via ORAL
  Filled 2012-12-17 (×2): qty 2

## 2012-12-17 MED ORDER — LOSARTAN POTASSIUM-HCTZ 100-25 MG PO TABS: 1.0000 | ORAL_TABLET | Freq: Every morning | ORAL | Status: DC

## 2012-12-17 MED ORDER — ALUM HYDROXIDE-MAG TRISILICATE 80-20 MG PO CHEW
2.0000 | CHEWABLE_TABLET | Freq: Three times a day (TID) | ORAL | Status: DC | PRN
  Administered 2012-12-17: 2 via ORAL
  Filled 2012-12-17: qty 2

## 2012-12-17 MED ORDER — HYDROCHLOROTHIAZIDE 25 MG PO TABS
25.0000 mg | ORAL_TABLET | Freq: Every day | ORAL | Status: DC
  Administered 2012-12-17 – 2012-12-18 (×2): 25 mg via ORAL
  Filled 2012-12-17 (×2): qty 1

## 2012-12-17 NOTE — Telephone Encounter (Signed)
Message copied by [name] on Thu Dec 17, 2012  9:24 AM ------      Message from: Melvia Heaps D      Created: Thu Dec 17, 2012  8:53 AM       Jesusita Oka,      This patient was admitted with pancreatitis.  Incidentally (I think) noted is a lesion in the pancreatic head which, by MRI, looks like an IPMN.  I think EUS may be helpful.  In addition, we are unsure of the etiology for pancreatitis (pt does have stones) and it would be helpful to look for CBD stones at EUS despite the negative MRCP.  EUS can be done electively as outpt.            Thanks,      Rob

## 2012-12-17 NOTE — Telephone Encounter (Signed)
Victoria [name], She needs upper EUS, radial +/- linear in 4-5 weeks as outpatient for recent pancreatitis, abnormal pancreas, ++ propofol.  Rob, I agree that EUS is good next step to evaluate the pancreas.  I like to give a few weeks for the acute inflammation to resolve prior to the EUS. Thanks.  Victoria [name] will set it up.

## 2012-12-17 NOTE — Progress Notes (Signed)
[facility] Progress Note  Subjective:  Patient upset and anxious about this pancreatic lesion.  Has not received any pain medication yet today.  Complaining of a lot of gas and bloating in upper abdomen.  Objective:  Vital signs in last 24 hours: Temp:  [97.4 F (36.3 C)-98.3 F (36.8 C)] 98.3 F (36.8 C) (12/26 0550) Pulse Rate:  [58-67] 67  (12/26 0550) Resp:  [18] 18  (12/26 0550) BP: (150-155)/(90-92) 155/90 mmHg (12/26 0550) SpO2:  [98 %-99 %] 98 % (12/26 0550) Last BM Date:  (sun or mon per patient) General:   Alert, Well-developed, in NAD; anxious. Heart:  Regular rate and rhythm; no murmurs Pulm:  CTAB.  No W/R/R. Abdomen:  Soft, nondistended. BS present.  Mild upper abdominal TTP without R/R/G. Extremities:  Without edema. Neurologic:  Alert and  oriented x4;  grossly normal neurologically. Psych:  Alert and cooperative. Normal mood and affect.  Intake/Output from previous day: 12/25 0701 - 12/26 0700 In: 480 [P.O.:480] Out: -   Lab Results:  Basename 12/16/12 0510 12/15/12 0535 12/14/12 1150  WBC 5.2 5.9 7.4  HGB 12.4 12.4 13.7  HCT 36.6 36.3 39.4  PLT 110* 119* 126*   BMET  Basename 12/16/12 1159 12/16/12 0510 12/15/12 0535  NA 136 139 136  K 3.2* 3.3* 4.2  CL 100 104 102  CO2 26 29 28   GLUCOSE 153* 100* 102*  BUN 7 8 11   CREATININE 0.67 0.78 0.81  CALCIUM 8.4 8.5 8.5   LFT  Basename 12/16/12 0510  PROT 6.7  ALBUMIN 2.8*  AST 35  ALT 33  ALKPHOS 47  BILITOT 0.6  BILIDIR --  IBILI --   PT/INR  Basename 12/15/12 0957  LABPROT 14.6  INR 1.16   Hepatitis Panel  Basename 12/15/12 0957  HEPBSAG NEGATIVE  HCVAB Reactive*  HEPAIGM NEGATIVE  HEPBIGM NEGATIVE    Mr 3d Recon At Scanner  12/16/2012  *RADIOLOGY REPORT*  Clinical Data:  Pancreatitis, possible pancreatic head mass on prior exam  MRI ABDOMEN WITHOUT AND WITH CONTRAST (MRCP)  Technique:  Multiplanar multisequence MR imaging of the abdomen was performed without and  with contrast, including heavily T2-weighted images of the biliary and pancreatic ducts.  Three-dimensional MR images were rendered by post processing of the original MR data.  Contrast: 20mL MULTIHANCE GADOBENATE DIMEGLUMINE 529 MG/ML IV SOLN  Comparison:  CT 12/14/2012  Findings:  Trace perihepatic ascites is again noted.  Gallstones are noted without gallbladder wall thickening or pericholecystic edema.  1.9 cm left lower renal pole cortical cyst again noted and two sub centimeter T2 hyperintense right upper and mid cortical lesions are most likely cysts as well.  The liver contour is minimally nodular.  Small fat containing right Bochdalek hernia. Adrenal glands and spleen are normal.  No pancreatic, intrahepatic, or common ductal dilatation is identified. No intrapancreatic edema or peripancreatic fluid.  In the pancreatic head, there is a 9 mm T2 hyperintense structure immediately adjacent to the common duct.  This does not enhance after administration of contrast.  There is otherwise symmetric enhancement of the pancreatic parenchyma. Several non enhancing oval and round lesions measuring 4 mm or less in the posterior segment right hepatic lobe are identified.  These are not T2 hyperintense or otherwise identified on other series but most likely represent tiny cysts or hamartomas.  No enhancing hepatic, pancreatic, splenic, adrenal, or renal masses identified.  No lymphadenopathy.  IMPRESSION: T2 hyperintense nonenhancing pancreatic head lesion demonstrates imaging characteristics and location  most typical for an intraductal papillary mucinous neoplasm.  Differential considerations could include pseudocyst given the history of pancreatitis, or congenital pancreatic cyst.  Follow-up MRI in 6-12 months could be considered.   Original Report Authenticated By: Christiana Pellant, M.D.    Mr Abd W/wo Cm/mrcp  12/16/2012  *RADIOLOGY REPORT*  Clinical Data:  Pancreatitis, possible pancreatic head mass on prior exam   MRI ABDOMEN WITHOUT AND WITH CONTRAST (MRCP)  Technique:  Multiplanar multisequence MR imaging of the abdomen was performed without and with contrast, including heavily T2-weighted images of the biliary and pancreatic ducts.  Three-dimensional MR images were rendered by post processing of the original MR data.  Contrast: 20mL MULTIHANCE GADOBENATE DIMEGLUMINE 529 MG/ML IV SOLN  Comparison:  CT 12/14/2012  Findings:  Trace perihepatic ascites is again noted.  Gallstones are noted without gallbladder wall thickening or pericholecystic edema.  1.9 cm left lower renal pole cortical cyst again noted and two sub centimeter T2 hyperintense right upper and mid cortical lesions are most likely cysts as well.  The liver contour is minimally nodular.  Small fat containing right Bochdalek hernia. Adrenal glands and spleen are normal.  No pancreatic, intrahepatic, or common ductal dilatation is identified. No intrapancreatic edema or peripancreatic fluid.  In the pancreatic head, there is a 9 mm T2 hyperintense structure immediately adjacent to the common duct.  This does not enhance after administration of contrast.  There is otherwise symmetric enhancement of the pancreatic parenchyma. Several non enhancing oval and round lesions measuring 4 mm or less in the posterior segment right hepatic lobe are identified.  These are not T2 hyperintense or otherwise identified on other series but most likely represent tiny cysts or hamartomas.  No enhancing hepatic, pancreatic, splenic, adrenal, or renal masses identified.  No lymphadenopathy.  IMPRESSION: T2 hyperintense nonenhancing pancreatic head lesion demonstrates imaging characteristics and location most typical for an intraductal papillary mucinous neoplasm.  Differential considerations could include pseudocyst given the history of pancreatitis, or congenital pancreatic cyst.  Follow-up MRI in 6-12 months could be considered.   Original Report Authenticated By: Christiana Pellant,  M.D.     Assessment / Plan: 1. Acute pancreatitis. Clinically resolving. Etiology is still uncertain.  Gallstones are present and she may have microlithiasis causing pancreatitis. Medications are another possibility.  2. IPMN - MRI characteristics are most consistent with an intra-papillary mucinous neoplasm of the pancreas. This is probably incidental to pancreatitis.  3. Possible cirrhosis   *Would not advance diet until patient is off narcotics; has not taken any pain medication today.  *EUS is being scheduled through our office for further delineation of pancreatic lesion and to rule out choledocholithiasis as the cause of pancreatitis.  *Await remaining liver serologies.  Hep C Ab was reactive, so will check a RNA viral load.  Also, ceruloplasmin was low so will check a 24 hour urine copper.     LOS: 3 days   ZEHR, JESSICA D.  12/17/2012, 9:03 AM  Pager number [phone]  I have personally taken an interval history, reviewed the chart, and examined the patient.  I agree with the extender's note, impression and recommendations.  Barbette Hair. Arlyce Dice, MD, [facility] [phone]

## 2012-12-17 NOTE — Progress Notes (Signed)
[facility] PROGRESS NOTE  ANASTASIA TOMPSON ZOX:[id number] DOB: 08-Oct-1941 DOA: 12/14/2012  PCP: [name], MD  Brief HPI: Victoria [name] is a 71 y.o. female with history of DM 2, HTN, breast cancer, partial thyroidectomy presented to ED on 12/23 with one week history of abdominal pain and bloating. She indicated that her bloating actually started approximately 2 weeks ago but since the last one week her symptoms have worsened. She complained of upper abdominal pain, intermittent, radiating around both sides to the back, sharp and 10/10 at its worst, worsened by hunger and relieved somewhat with by mouth intake and Nexium. She complained of intermittent bitter tasting fluid in the mouth. Nauseous with reduced by mouth intake. No vomiting, constipation or diarrhea. No fever or chills. No recent change in home medications or insect bites. She thought that these symptoms were due to gastritis which she had 7 years ago and tried over-the-counter Nexium without significant relief. She presented to the emergency department where her lipase is approximately 490 and CT abdomen shows cholelithiasis, mild gallbladder wall thickening, cirrhotic liver, findings suggesting acute pancreatitis involving the head of the pancreas, indeterminate 14 mm hypodensity in the head of the pancreas. neoplasm is not excluded and sigmoid diverticulosis without evidence of acute diverticulitis.   Past medical history:  Past Medical History  Diagnosis Date  . Hypertension   . Cancer     breast  . Diabetes mellitus   . Abnormal finding on Pap smear   . Obesity     Consultants: [facility]  Procedures: None  Antibiotics: None  Subjective: No nausea or vomiting noted. Patient tolerating liquid diet well. I told her about the possibility of the lesion in pancreas of being cancerous and plan by GI to follow up with an ultrasound guided biopsy of the lesion. No complaints.  Objective: Vital Signs  Filed  Vitals:   12/16/12 0541 12/16/12 1428 12/16/12 1445 12/17/12 0550  BP: 136/80 150/92  155/90  Pulse: 53 58  67  Temp: 97.5 F (36.4 C)  97.4 F (36.3 C) 98.3 F (36.8 C)  TempSrc: Oral  Oral Oral  Resp: 18 18  18   Height:      Weight:      SpO2: 97% 99%  98%    Intake/Output Summary (Last 24 hours) at 12/17/12 0802 Last data filed at 12/16/12 1831  Gross per 24 hour  Intake    480 ml  Output      0 ml  Net    480 ml   Filed Weights   12/14/12 1938  Weight: 238 lb 15.7 oz (108.4 kg)    Intake/Output from previous day: 12/25 0701 - 12/26 0700 In: 480 [P.O.:480] Out: -   General appearance: alert, cooperative, appears stated age, no distress and moderately obese Back: symmetric, no curvature. ROM normal. No CVA tenderness. Resp: clear to auscultation bilaterally Cardio: regular rate and rhythm, S1, S2 normal, no murmur, click, rub or gallop GI: soft, non-tender; bowel sounds normal; no masses,  no organomegaly Extremities: extremities normal, atraumatic, no cyanosis or edema Pulses: 2+ and symmetric Neurologic: Alert and oriented x 3. No focal deficits.  Lab Results:  Basic Metabolic Panel:  Lab 12/16/12 8119 12/16/12 0510 12/15/12 0535 12/14/12 1150  NA 136 139 136 135  K 3.2* 3.3* 4.2 4.0  CL 100 104 102 99  CO2 26 29 28 29   GLUCOSE 153* 100* 102* 155*  BUN 7 8 11 15   CREATININE 0.67 0.78 0.81 0.70  CALCIUM 8.4 8.5 8.5 9.4  MG -- -- -- --  PHOS -- -- -- --   Liver Function Tests:  Lab 12/16/12 0510 12/15/12 0535 12/14/12 1150  AST 35 43* 40*  ALT 33 33 40*  ALKPHOS 47 43 64  BILITOT 0.6 0.5 0.5  PROT 6.7 6.5 7.4  ALBUMIN 2.8* 2.8* 3.3*    Lab 12/17/12 0504 12/16/12 0510 12/14/12 1150  LIPASE 93* 131* 489*  AMYLASE -- -- --   CBC:  Lab 12/16/12 0510 12/15/12 0535 12/14/12 1150  WBC 5.2 5.9 7.4  NEUTROABS -- -- 3.1  HGB 12.4 12.4 13.7  HCT 36.6 36.3 39.4  MCV 84.5 84.2 84.7  PLT 110* 119* 126*    Studies/Results: Mr 3d Recon At  Scanner  12/16/2012  IMPRESSION: T2 hyperintense nonenhancing pancreatic head lesion demonstrates imaging characteristics and location most typical for an intraductal papillary mucinous neoplasm.  Differential considerations could include pseudocyst given the history of pancreatitis, or congenital pancreatic cyst.  Follow-up MRI in 6-12 months could be considered.   Original Report Authenticated By: Christiana Pellant, M.D.    Mr Abd W/wo Cm/mrcp  12/16/2012    IMPRESSION: T2 hyperintense nonenhancing pancreatic head lesion demonstrates imaging characteristics and location most typical for an intraductal papillary mucinous neoplasm.  Differential considerations could include pseudocyst given the history of pancreatitis, or congenital pancreatic cyst.  Follow-up MRI in 6-12 months could be considered.   Original Report Authenticated By: Christiana Pellant, M.D.    Medications:  Scheduled:    . amLODipine  2.5 mg Oral QHS  . enoxaparin (LOVENOX) injection  50 mg Subcutaneous Q24H  . pantoprazole (PROTONIX) IV  40 mg Intravenous QHS   Continuous:    . sodium chloride 75 mL/hr at 12/15/12 1124   WJX:[id number], acetaminophen, albuterol, albuterol, hydrocerin, HYDROmorphone (DILAUDID) injection, hydrOXYzine, ondansetron (ZOFRAN) IV, ondansetron, oxyCODONE  Assessment/Plan:  Principal Problem:  *Acute pancreatitis Active Problems:  Diabetes mellitus  Hypertension  History of breast cancer  Cholelithiasis  Thrombocytopenia  Nonspecific (abnormal) findings on radiological and other examination of other intrathoracic organs  Cirrhosis    Acute pancreatitis  Unclear etiology. No history of alcohol abuse, hypertriglyceridemia (normal triglycerides in October 2013), or recent change in medications. Biliary pancreatitis - minimally elevated transaminases and gallstones on CT abdomen. Also CT abdomen suggests indeterminate 14 mm hypodensity in head of pancreas which was evaluated by MRCP. MRI  most c/w an intra-papillary mucinous neoplasm of pancreas. GI planning to do EUS to delineate pancreatic lesion Continue clear liquids, IV fluids, IV PPI and pain management  [facility] is following   Pancreatic mass Most Likely intraductal papillary mucinous neoplasm.  Differential considerations could include pseudocyst given the history of pancreatitis, or congenital pancreatic cyst.  -MRI findings as above.  -Dr Christella Hartigan from GI will do endoscopic ultrasound.  Liver cirrhosis Awaiting hepatitis serologies -GI following  Type 2 diabetes mellitus  Patient has not taken her Januvia for 2 weeks since onset of symptoms.  Hemoglobin A1c in October was well controlled.  Continue sliding scale insulin and monitor.  Hypertension  Controlled.  Restart losartan-HCTZ. Continue amlodipine at bedtime and monitor.  Mild thrombocytopenia  Chronic and stable.  History of breast cancer  Status post right lumpectomy and radiation.  Said to be in remission.  Status post partial thyroidectomy  Clinically euthyroid  TSH in October 2013 was normal.  Cough Unclear etiology. Lungs are clear. Try albuterol inhaler.  DVT Prophylaxis Enoxaparin  Code Status: Full  Family Communication: Discussed with  patient  Disposition Plan: Per GI   LOS: 3 days   Lars Mage  [facility] Pager [phone] 12/17/2012, 8:02 AM  If 8PM-8AM, please contact night-coverage at [email or web address], password [facility]

## 2012-12-18 ENCOUNTER — Other Ambulatory Visit: Payer: Self-pay

## 2012-12-18 DIAGNOSIS — K859 Acute pancreatitis without necrosis or infection, unspecified: Secondary | ICD-10-CM | POA: Diagnosis not present

## 2012-12-18 DIAGNOSIS — K7460 Unspecified cirrhosis of liver: Secondary | ICD-10-CM | POA: Diagnosis not present

## 2012-12-18 LAB — BASIC METABOLIC PANEL
BUN: 5 mg/dL — ABNORMAL LOW (ref 6–23)
CO2: 29 mEq/L (ref 19–32)
Calcium: 8.7 mg/dL (ref 8.4–10.5)
Chloride: 100 mEq/L (ref 96–112)
Creatinine, Ser: 0.79 mg/dL (ref 0.50–1.10)
GFR calc Af Amer: >90 mL/min (ref >90)
GFR calc non Af Amer: 83 mL/min — ABNORMAL LOW (ref >90)
Glucose, Bld: 103 mg/dL — ABNORMAL HIGH (ref 70–99)
Potassium: 3.3 mEq/L — ABNORMAL LOW (ref 3.5–5.1)
Sodium: 137 mEq/L (ref 135–145)

## 2012-12-18 LAB — CBC
HCT: 37.8 % (ref 36.0–46.0)
Hemoglobin: 13 g/dL (ref 12.0–15.0)
MCH: 29 pg (ref 26.0–34.0)
MCHC: 34.4 g/dL (ref 30.0–36.0)
MCV: 84.2 fL (ref 78.0–100.0)
Platelets: 106 10*3/uL — ABNORMAL LOW (ref 150–400)
RBC: 4.49 MIL/uL (ref 3.87–5.11)
RDW: 13.7 % (ref 11.5–15.5)
WBC: 5 10*3/uL (ref 4.0–10.5)

## 2012-12-18 LAB — HEPATITIS C VRS RNA DETECT BY PCR-QUAL: Hepatitis C Vrs RNA by PCR-Qual: POSITIVE — AB

## 2012-12-18 LAB — LIPASE, BLOOD: Lipase: 74 U/L — ABNORMAL HIGH (ref 11–59)

## 2012-12-18 MED ORDER — OXYCODONE HCL 5 MG PO TABS: 5.0000 mg | ORAL_TABLET | ORAL | Status: DC | PRN

## 2012-12-18 MED ORDER — HYDROXYZINE HCL 25 MG PO TABS: 25.0000 mg | ORAL_TABLET | Freq: Three times a day (TID) | ORAL | Status: DC | PRN

## 2012-12-18 NOTE — Discharge Summary (Signed)
Physician Discharge Summary  Victoria [name] WGN:[id number] DOB: Feb 28, 1941 DOA: 12/14/2012  PCP: [name], MD  Admit date: 12/14/2012 Discharge date: 12/18/2012  Recommendations for Outpatient Follow-up:  1. Patient has follow up appointment as below. 2. Patient's Alma Friendly was stopped due to risk for acute pancreatitis and patient PCP is requested to adjust regimen for diabetes control accordingly.   Follow-up Information    Follow up with Melvia Heaps, MD. On 01/19/2013. (9:15 am)    Contact information:   [address] [place] [address] [phone]       Follow up with [name], MD. Schedule an appointment as soon as possible for a visit today. (If symptoms worsen)    Contact information:   [address] [place] [address] [phone]         Discharge Diagnoses . Acute pancreatitis  Cholelithiasis  Thrombocytopenia  Nonspecific (abnormal) findings on radiological examination (pancreatic head)  Diabetes mellitus  . Hypertension  . History of breast cancer  . Anxiety  . Allergic rhinitis  . Cirrhosis     Discharge Condition: stable Disposition: Home  Diet recommendation: general  Filed Weights   12/14/12 1938  Weight: 238 lb 15.7 oz (108.4 kg)    History of present illness: Victoria [name] is a 71 y.o. female with history of DM 2, HTN, breast cancer, partial thyroidectomy presented to ED on 12/23 with one week history of abdominal pain and bloating. She indicated that her bloating actually started approximately 2 weeks ago but since the last one week her symptoms have worsened. She complained of upper abdominal pain, intermittent, radiating around both sides to the back, sharp and 10/10 at its worst, worsened by hunger and relieved somewhat with by mouth intake and Nexium. She complained of intermittent bitter tasting fluid in the mouth. Nauseous with reduced by mouth intake. No vomiting, constipation or  diarrhea. No fever or chills. No recent change in home medications or insect bites. She thought that these symptoms were due to gastritis which she had 7 years ago and tried over-the-counter Nexium without significant relief. She presented to the emergency department where her lipase is approximately 490 and CT abdomen shows cholelithiasis, mild gallbladder wall thickening, cirrhotic liver, findings suggesting acute pancreatitis involving the head of the pancreas, indeterminate 14 mm hypodensity in the head of the pancreas. neoplasm is not excluded and sigmoid diverticulosis without evidence of acute diverticulitis.     Hospital Course:   Acute pancreatitis  Unclear etiology. No history of alcohol abuse, hypertriglyceridemia (normal triglycerides in October 2013), or recent change in medications. Biliary pancreatitis - minimally elevated transaminases and gallstones on CT abdomen. Also CT abdomen suggests indeterminate 14 mm hypodensity in head of pancreas which was evaluated by MRCP. MRI most c/w an intra-papillary mucinous neoplasm of pancreas which seems to be the most likely source at this time. GI plans to do EUS to delineate pancreatic lesion as an outpatient and we discharged the patient home as soon as she started tolerating her diet well.  Pancreatic mass  Most Likely intraductal papillary mucinous neoplasm per radiology read. Differential considerations could include pseudocyst given the history of pancreatitis, or congenital pancreatic cyst. MRI findings as noted below. Dr Christella Hartigan from GI will do endoscopic ultrasound as an outpatient.  Liver cirrhosis  Unclear etiology at this time. GI will follow up.  Type 2 diabetes mellitus  Patient has not taken her Januvia for 2 weeks since onset of symptoms. We discontinued  it as there have been case report of januvia been associated with risk of pancreatitis. Hemoglobin A1c in October was well controlled. We continued sliding scale insulin while  inpatient.  Hypertension  Controlled. Restarted losartan-HCTZ and amlodipine.   Mild thrombocytopenia  Chronic and stable.   History of breast cancer  Status post right lumpectomy and radiation.  Said to be in remission, to be followed up by PCP at discharge.   Status post partial thyroidectomy  Clinically euthyroid. TSH in October 2013 was normal.   Discharge Instructions  Discharge Orders    Future Appointments: Provider: Department: Dept Phone: Center:   01/19/2013 9:15 AM Louis Meckel, MD [facility] [phone] [facility]   04/13/2013 9:45 AM [name], MD [facility], MD [phone] [id number]     Future Orders Please Complete By Expires   Diet Carb Modified      Increase activity slowly      Discharge instructions      Comments:   Follow up with GI and your primary care physician.       Medication List     As of 12/18/2012  3:14 PM    STOP taking these medications         sitaGLIPtin 100 MG tablet   Commonly known as: JANUVIA      TAKE these medications         amLODipine 2.5 MG tablet   Commonly known as: NORVASC   Take 2.5 mg by mouth at bedtime.      ibuprofen 200 MG tablet   Commonly known as: ADVIL,MOTRIN   Take 400 mg by mouth every 6 (six) hours as needed. For pain      losartan-hydrochlorothiazide 100-25 MG per tablet   Commonly known as: HYZAAR   Take 1 tablet by mouth every morning.      metFORMIN 500 MG tablet   Commonly known as: GLUCOPHAGE   Take 500 mg by mouth 2 (two) times daily.      oxyCODONE 5 MG immediate release tablet   Commonly known as: Oxy IR/ROXICODONE   Take 1 tablet (5 mg total) by mouth every 4 (four) hours as needed.           Follow-up Information    Follow up with Melvia Heaps, MD. On 01/19/2013. (9:15 am)    Contact information:   [address] [place] [address] [phone]       Follow up with [name], MD. Schedule an appointment as  soon as possible for a visit today. (If symptoms worsen)    Contact information:   [address] [place] [address] [phone]           The results of significant diagnostics from this hospitalization (including imaging, microbiology, ancillary and laboratory) are listed below for reference.    Significant Diagnostic Studies: Ct Abdomen Pelvis W Contrast  12/14/2012  *RADIOLOGY REPORT*  Clinical Data: Abdominal pain  CT ABDOMEN AND PELVIS WITH CONTRAST  Technique:  Multidetector CT imaging of the abdomen and pelvis was performed following the standard protocol during bolus administration of intravenous contrast.  Contrast: OMNIPAQUE IOHEXOL 300 MG/ML  SOLN  Comparison: None.  Findings: Several gallstones are noted.  Mild gallbladder wall thickening.  Fat planes within the head of the pancreas are blurred.  There is subtle stranding about the pancreatic head.  Findings are suspicious for acute pancreatitis.  14 mm hypodensity is present in the head  of the pancreas which is indeterminate.  See image 21.  Diffuse hepatic steatosis.  The left lobe of the liver is enlarged with a nodular contour compatible with cirrhosis.  Spleen, right adrenal gland, right kidney are within normal limits. Inflammatory changes of the left adrenal gland are suspected. Simple cyst in the lower pole of the left kidney.  Normal appendix.  Sigmoid diverticulosis without evidence of acute diverticulitis.  Unremarkable bladder.  Uterus and adnexa are within normal limits.  Bilateral inguinal hernia containing adipose tissue.  Right gonadal vein is prominent and dilated.  IMPRESSION: Cholelithiasis.  Mild gallbladder wall thickening is nonspecific.  Cirrhotic liver.  Findings suggesting acute pancreatitis involving the head of the pancreas.  Indeterminate 14 mm hypodensity in the head of the pancreas. Neoplasm is not excluded.  At the minimum, short-term 45-month follow-up CT can be performed to reassess.   Sigmoid diverticulosis without evidence of acute diverticulitis.   Original Report Authenticated By: Jolaine Click, M.D.    Mr 3d Recon At Scanner  12/16/2012  *RADIOLOGY REPORT*  Clinical Data:  Pancreatitis, possible pancreatic head mass on prior exam  MRI ABDOMEN WITHOUT AND WITH CONTRAST (MRCP)  Technique:  Multiplanar multisequence MR imaging of the abdomen was performed without and with contrast, including heavily T2-weighted images of the biliary and pancreatic ducts.  Three-dimensional MR images were rendered by post processing of the original MR data.  Contrast: 20mL MULTIHANCE GADOBENATE DIMEGLUMINE 529 MG/ML IV SOLN  Comparison:  CT 12/14/2012  Findings:  Trace perihepatic ascites is again noted.  Gallstones are noted without gallbladder wall thickening or pericholecystic edema.  1.9 cm left lower renal pole cortical cyst again noted and two sub centimeter T2 hyperintense right upper and mid cortical lesions are most likely cysts as well.  The liver contour is minimally nodular.  Small fat containing right Bochdalek hernia. Adrenal glands and spleen are normal.  No pancreatic, intrahepatic, or common ductal dilatation is identified. No intrapancreatic edema or peripancreatic fluid.  In the pancreatic head, there is a 9 mm T2 hyperintense structure immediately adjacent to the common duct.  This does not enhance after administration of contrast.  There is otherwise symmetric enhancement of the pancreatic parenchyma. Several non enhancing oval and round lesions measuring 4 mm or less in the posterior segment right hepatic lobe are identified.  These are not T2 hyperintense or otherwise identified on other series but most likely represent tiny cysts or hamartomas.  No enhancing hepatic, pancreatic, splenic, adrenal, or renal masses identified.  No lymphadenopathy.  IMPRESSION: T2 hyperintense nonenhancing pancreatic head lesion demonstrates imaging characteristics and location most typical for an  intraductal papillary mucinous neoplasm.  Differential considerations could include pseudocyst given the history of pancreatitis, or congenital pancreatic cyst.  Follow-up MRI in 6-12 months could be considered.   Original Report Authenticated By: Christiana Pellant, M.D.    Mr Abd W/wo Cm/mrcp  12/16/2012  *RADIOLOGY REPORT*  Clinical Data:  Pancreatitis, possible pancreatic head mass on prior exam  MRI ABDOMEN WITHOUT AND WITH CONTRAST (MRCP)  Technique:  Multiplanar multisequence MR imaging of the abdomen was performed without and with contrast, including heavily T2-weighted images of the biliary and pancreatic ducts.  Three-dimensional MR images were rendered by post processing of the original MR data.  Contrast: 20mL MULTIHANCE GADOBENATE DIMEGLUMINE 529 MG/ML IV SOLN  Comparison:  CT 12/14/2012  Findings:  Trace perihepatic ascites is again noted.  Gallstones are noted without gallbladder wall thickening or pericholecystic edema.  1.9 cm left lower  renal pole cortical cyst again noted and two sub centimeter T2 hyperintense right upper and mid cortical lesions are most likely cysts as well.  The liver contour is minimally nodular.  Small fat containing right Bochdalek hernia. Adrenal glands and spleen are normal.  No pancreatic, intrahepatic, or common ductal dilatation is identified. No intrapancreatic edema or peripancreatic fluid.  In the pancreatic head, there is a 9 mm T2 hyperintense structure immediately adjacent to the common duct.  This does not enhance after administration of contrast.  There is otherwise symmetric enhancement of the pancreatic parenchyma. Several non enhancing oval and round lesions measuring 4 mm or less in the posterior segment right hepatic lobe are identified.  These are not T2 hyperintense or otherwise identified on other series but most likely represent tiny cysts or hamartomas.  No enhancing hepatic, pancreatic, splenic, adrenal, or renal masses identified.  No  lymphadenopathy.  IMPRESSION: T2 hyperintense nonenhancing pancreatic head lesion demonstrates imaging characteristics and location most typical for an intraductal papillary mucinous neoplasm.  Differential considerations could include pseudocyst given the history of pancreatitis, or congenital pancreatic cyst.  Follow-up MRI in 6-12 months could be considered.   Original Report Authenticated By: Christiana Pellant, M.D.     Microbiology: No results found for this or any previous visit (from the past 240 hour(s)).   Labs: Basic Metabolic Panel:  Lab 12/18/12 1610 12/17/12 1003 12/16/12 1159 12/16/12 0510 12/15/12 0535  NA 137 136 136 139 136  K 3.3* 3.5 3.2* 3.3* 4.2  CL 100 102 100 104 102  CO2 29 27 26 29 28   GLUCOSE 103* 189* 153* 100* 102*  BUN 5* 6 7 8 11   CREATININE 0.79 0.66 0.67 0.78 0.81  CALCIUM 8.7 8.2* 8.4 8.5 8.5  MG -- -- -- -- --  PHOS -- -- -- -- --   Liver Function Tests:  Lab 12/16/12 0510 12/15/12 0535 12/14/12 1150  AST 35 43* 40*  ALT 33 33 40*  ALKPHOS 47 43 64  BILITOT 0.6 0.5 0.5  PROT 6.7 6.5 7.4  ALBUMIN 2.8* 2.8* 3.3*    Lab 12/18/12 0510 12/17/12 0504 12/16/12 0510 12/14/12 1150  LIPASE 74* 93* 131* 489*  AMYLASE -- -- -- --   No results found for this basename: AMMONIA:5 in the last 168 hours CBC:  Lab 12/18/12 0510 12/16/12 0510 12/15/12 0535 12/14/12 1150  WBC 5.0 5.2 5.9 7.4  NEUTROABS -- -- -- 3.1  HGB 13.0 12.4 12.4 13.7  HCT 37.8 36.6 36.3 39.4  MCV 84.2 84.5 84.2 84.7  PLT 106* 110* 119* 126*   Cardiac Enzymes: No results found for this basename: CKTOTAL:5,CKMB:5,CKMBINDEX:5,TROPONINI:5 in the last 168 hours BNP: BNP (last 3 results) No results found for this basename: PROBNP:3 in the last 8760 hours CBG: No results found for this basename: GLUCAP:5 in the last 168 hours  Principal Problem:  *Acute pancreatitis Active Problems:  Diabetes mellitus  Hypertension  History of breast cancer  Cholelithiasis  Thrombocytopenia   Nonspecific (abnormal) findings on radiological and other examination of other intrathoracic organs  Cirrhosis   Time coordinating discharge: 40  Signed:  Lars Mage, MD [facility] 12/18/2012, 3:14 PM

## 2012-12-18 NOTE — Telephone Encounter (Signed)
Left message on machine to call back  

## 2012-12-18 NOTE — Progress Notes (Signed)
Continues to improve, tolerating diet.  Completing 24hr urine collection. OK to discharge.  I will see her as outpt where she will be scheduled for EUS.  If HCV quantitative is positive for active viral replication will refer to hepatitis clinic for Rx.

## 2012-12-18 NOTE — Telephone Encounter (Signed)
EUS 01/28/13

## 2012-12-21 LAB — ANTI-SMOOTH MUSCLE ANTIBODY, IGG: F-Actin IgG: 13 U (ref <20)

## 2012-12-21 NOTE — Telephone Encounter (Signed)
Pt is aware and meds reviewed pt is taking metformin and is aware to hold the day of the procedure.  She will call with any questions or concerns

## 2012-12-22 LAB — ALPHA-1 ANTITRYPSIN PHENOTYPE: A-1 Antitrypsin: 155 mg/dL (ref 83–199)

## 2012-12-23 HISTORY — PX: CATARACT EXTRACTION, BILATERAL: SHX1313

## 2012-12-28 ENCOUNTER — Encounter: Payer: Self-pay | Admitting: Internal Medicine

## 2012-12-28 ENCOUNTER — Ambulatory Visit (INDEPENDENT_AMBULATORY_CARE_PROVIDER_SITE_OTHER): Payer: Medicare Other | Admitting: Internal Medicine

## 2012-12-28 VITALS — BP 130/82 | HR 76 | Temp 98.6°F | Ht 68.0 in | Wt 234.0 lb

## 2012-12-28 DIAGNOSIS — I10 Essential (primary) hypertension: Secondary | ICD-10-CM | POA: Diagnosis not present

## 2012-12-28 DIAGNOSIS — B1920 Unspecified viral hepatitis C without hepatic coma: Secondary | ICD-10-CM

## 2012-12-28 DIAGNOSIS — R894 Abnormal immunological findings in specimens from other organs, systems and tissues: Secondary | ICD-10-CM

## 2012-12-28 DIAGNOSIS — K8020 Calculus of gallbladder without cholecystitis without obstruction: Secondary | ICD-10-CM

## 2012-12-28 DIAGNOSIS — K7460 Unspecified cirrhosis of liver: Secondary | ICD-10-CM

## 2012-12-28 DIAGNOSIS — K859 Acute pancreatitis without necrosis or infection, unspecified: Secondary | ICD-10-CM | POA: Diagnosis not present

## 2012-12-28 DIAGNOSIS — E119 Type 2 diabetes mellitus without complications: Secondary | ICD-10-CM | POA: Diagnosis not present

## 2012-12-28 DIAGNOSIS — R768 Other specified abnormal immunological findings in serum: Secondary | ICD-10-CM

## 2012-12-28 NOTE — Patient Instructions (Addendum)
Change Losartan-HCTZ to just Losartan as HCTZ can cause pancreatitis. Continue amlodipine and metformin. Stop [place] because you cannot afford it. Keep accuchecks bid for now. Take  medsas directed. Avoid fatty foods. Call for elevated accucheck 200 or greater. Call for fever or chills.

## 2012-12-28 NOTE — Progress Notes (Signed)
  Subjective:    Patient ID: Victoria [name], female    DOB: 1941/06/26, 72 y.o.   MRN: [id number]  HPI 72 year old Black female recently hospitalized for pancreatitis. Patient has history of diabetes mellitus treated with Januvia. Gallstones were noted without gallbladder wall thickening. In the pancreatic hand there was a 9 mm T2 hyperintense structure immediately adjacent to the common duct. Several nonenhancing oval and round lesions measuring 4 mm and lesser size in the posterior segment of the right hepatic lobe identified. Pt is scheduled to see Dr. Christella Hartigan for further evaluation. Pt. Is upset about this illness. It came on suddenly. Denies alcohol use causing pancreatitis. No new meds. Also, upset that she has Hepatitis C antibody. Needs further evaluation to check for chronic active hepatitis C. She formerly worked as a Engineer, civil (consulting)    Review of Systems     Objective:   Physical Exam Skin: warm and dry. Nodes none. HEENT: TMs and pharynx clear neck supple. Chest clear. Abd: no ascites no organomegaly or tenderness        Assessment & Plan:  Pancreatitis Gallstones Positive Hep C antibody AODM HTN anxiety  Plan: Await further studies to determine if pt. Has chronic Hep C. Keep appt with dr. Adonis Huguenin for further evaluation. Stop Losartan/HCTZ since HCTZ can cause pancreatitis. Just take Losartan. Continue Metformin. Stop Januvia because of expense. Time spent 25 minutes.

## 2012-12-29 LAB — BASIC METABOLIC PANEL
BUN: 14 mg/dL (ref 6–23)
CO2: 35 mEq/L — ABNORMAL HIGH (ref 19–32)
Calcium: 9.1 mg/dL (ref 8.4–10.5)
Chloride: 99 mEq/L (ref 96–112)
Creat: 0.89 mg/dL (ref 0.50–1.10)
Glucose, Bld: 151 mg/dL — ABNORMAL HIGH (ref 70–99)
Potassium: 4 mEq/L (ref 3.5–5.3)
Sodium: 138 mEq/L (ref 135–145)

## 2012-12-29 LAB — HEMOGLOBIN A1C
Hgb A1c MFr Bld: 6.6 % — ABNORMAL HIGH (ref <5.7)
Mean Plasma Glucose: 143 mg/dL — ABNORMAL HIGH (ref <117)

## 2012-12-31 LAB — HEPATITIS C RNA QUANTITATIVE
HCV Quantitative Log: 6.38 {Log} — ABNORMAL HIGH (ref <1.18)
HCV Quantitative: 2405209 IU/mL — ABNORMAL HIGH (ref <15)

## 2012-12-31 NOTE — Progress Notes (Signed)
Patient informed. Will make referral to hepatic specialist

## 2013-01-01 LAB — COPPER, URINE, 24 HOUR
Copper,Urine (24 Hr): 9 mcg/L (ref 2–30)
Creatinine, Urine mg/day-CUURI: 2.5 g/(24.h) (ref 0.63–2.50)
Total Volume - CURRI: 4876 mL

## 2013-01-11 ENCOUNTER — Encounter (HOSPITAL_COMMUNITY): Payer: Self-pay | Admitting: *Deleted

## 2013-01-13 ENCOUNTER — Encounter (HOSPITAL_COMMUNITY): Payer: Self-pay | Admitting: Pharmacy Technician

## 2013-01-19 ENCOUNTER — Ambulatory Visit (INDEPENDENT_AMBULATORY_CARE_PROVIDER_SITE_OTHER): Payer: Medicare Other | Admitting: Gastroenterology

## 2013-01-19 ENCOUNTER — Encounter: Payer: Self-pay | Admitting: Gastroenterology

## 2013-01-19 VITALS — BP 130/82 | HR 76 | Ht 68.0 in | Wt 234.0 lb

## 2013-01-19 DIAGNOSIS — K859 Acute pancreatitis without necrosis or infection, unspecified: Secondary | ICD-10-CM | POA: Diagnosis not present

## 2013-01-19 DIAGNOSIS — B182 Chronic viral hepatitis C: Secondary | ICD-10-CM | POA: Insufficient documentation

## 2013-01-19 DIAGNOSIS — R933 Abnormal findings on diagnostic imaging of other parts of digestive tract: Secondary | ICD-10-CM | POA: Diagnosis not present

## 2013-01-19 NOTE — Progress Notes (Signed)
History of Present Illness: This is a posthospitalization visit for Victoria [name].  She was hospitalized with acute pancreatitis. Etiology was not determined. Incidentally noted by CT and MRI is a probable IPMN.  She's also noted to have hepatitis C. Patient no longer has pain. Energy level is slowly increasing. Appetite is fair.    Past Medical History  Diagnosis Date  . Hypertension   . Diabetes mellitus   . Abnormal finding on Pap smear   . Obesity   . Cancer     breast   Past Surgical History  Procedure Date  . Thyroidectomy, partial 1972    left  . Breast reduction surgery 2005  . Cataract extraction, bilateral   . Breast lumpectomy right   family history includes Diabetes in her mother and Stroke in her mother. Current Outpatient Prescriptions  Medication Sig Dispense Refill  . albuterol (PROVENTIL HFA;VENTOLIN HFA) 108 (90 BASE) MCG/ACT inhaler Inhale 2 puffs into the lungs every 6 (six) hours as needed. Wheezing and shortness of breath      . amLODipine (NORVASC) 2.5 MG tablet Take 2.5 mg by mouth at bedtime.      . cholecalciferol (VITAMIN D) 1000 UNITS tablet Take 1,000 Units by mouth daily.      . hydrOXYzine (ATARAX/VISTARIL) 25 MG tablet Take 1 tablet (25 mg total) by mouth 3 (three) times daily as needed for itching.  30 tablet  0  . ibuprofen (ADVIL,MOTRIN) 200 MG tablet Take 400 mg by mouth every 6 (six) hours as needed. For pain      . losartan (COZAAR) 100 MG tablet Take 100 mg by mouth daily before breakfast.      . metFORMIN (GLUCOPHAGE) 500 MG tablet Take 500 mg by mouth 2 (two) times daily.      . Skin Protectants, Misc. (EUCERIN) cream Apply 1 application topically as needed. All over the body for dry skin       Allergies as of 01/19/2013 - Review Complete 01/19/2013  Allergen Reaction Noted  . Lisinopril Cough 10/01/2011    reports that she has never smoked. She has never used smokeless tobacco. She reports that she does not drink alcohol or use illicit  drugs.     Review of Systems: Pertinent positive and negative review of systems were noted in the above HPI section. All other review of systems were otherwise negative.  Vital signs were reviewed in today's medical record Physical Exam: General: Well developed , well nourished, no acute distress Skin: anicteric Head: Normocephalic and atraumatic Eyes:  sclerae anicteric, EOMI Ears: Normal auditory acuity Mouth: No deformity or lesions Neck: Supple, no masses or thyromegaly Lungs: Clear throughout to auscultation Heart: Regular rate and rhythm; no murmurs, rubs or bruits Abdomen: Soft, non tender and non distended. No masses, hepatosplenomegaly or hernias noted. Normal Bowel sounds Rectal:deferred Musculoskeletal: Symmetrical with no gross deformities  Skin: No lesions on visible extremities Pulses:  Normal pulses noted Extremities: No clubbing, cyanosis, edema or deformities noted Neurological: Alert oriented x 4, grossly nonfocal Cervical Nodes:  No significant cervical adenopathy Inguinal Nodes: No significant inguinal adenopathy Psychological:  Alert and cooperative. Normal mood and affect

## 2013-01-19 NOTE — Patient Instructions (Addendum)
You will have to contact your PCP Marlan Palau about your referral to the Hep C clinic You will need to keep your appointment with Dr Christella Hartigan for your procedure Follow up with Dr Arlyce Dice in 8 weeks

## 2013-01-19 NOTE — Assessment & Plan Note (Addendum)
Unclear etiology. Patient does have some small stones although transaminases were minimally elevated. Gallstone pancreatitis remains a possibility. She is slowly improving  Recommendations #1 should patient develop recurrent pancreatitis I would  recommend cholecystectomy

## 2013-01-19 NOTE — Assessment & Plan Note (Signed)
Probable IPMN by CT and MRI. Patient is scheduled for EUS

## 2013-01-19 NOTE — Assessment & Plan Note (Signed)
Patient has hepatitis C with active viral replication. She is referred to the hepatology clinic for consideration of therapy.

## 2013-01-20 ENCOUNTER — Telehealth: Payer: Self-pay | Admitting: Internal Medicine

## 2013-01-20 NOTE — Telephone Encounter (Signed)
We are aware and made the referral for active Hepatitis C.

## 2013-01-23 ENCOUNTER — Other Ambulatory Visit: Payer: Self-pay | Admitting: Internal Medicine

## 2013-01-25 ENCOUNTER — Encounter (HOSPITAL_COMMUNITY): Payer: Self-pay | Admitting: *Deleted

## 2013-01-25 ENCOUNTER — Ambulatory Visit (HOSPITAL_COMMUNITY)
Admission: RE | Admit: 2013-01-25 | Discharge: 2013-01-25 | Disposition: A | Discharge status: Home / Self Care | Payer: Medicare Other | Source: Ambulatory Visit | Attending: Gastroenterology | Admitting: Gastroenterology

## 2013-01-25 DIAGNOSIS — I10 Essential (primary) hypertension: Secondary | ICD-10-CM | POA: Diagnosis not present

## 2013-01-25 DIAGNOSIS — E119 Type 2 diabetes mellitus without complications: Secondary | ICD-10-CM | POA: Insufficient documentation

## 2013-01-25 DIAGNOSIS — Z79899 Other long term (current) drug therapy: Secondary | ICD-10-CM | POA: Insufficient documentation

## 2013-01-25 DIAGNOSIS — Z01810 Encounter for preprocedural cardiovascular examination: Secondary | ICD-10-CM | POA: Insufficient documentation

## 2013-01-25 DIAGNOSIS — K859 Acute pancreatitis without necrosis or infection, unspecified: Secondary | ICD-10-CM | POA: Insufficient documentation

## 2013-01-25 DIAGNOSIS — Z01812 Encounter for preprocedural laboratory examination: Secondary | ICD-10-CM | POA: Insufficient documentation

## 2013-01-25 LAB — COMPREHENSIVE METABOLIC PANEL
ALT: 49 U/L — ABNORMAL HIGH (ref 0–35)
AST: 53 U/L — ABNORMAL HIGH (ref 0–37)
Albumin: 3.3 g/dL — ABNORMAL LOW (ref 3.5–5.2)
Alkaline Phosphatase: 76 U/L (ref 39–117)
BUN: 9 mg/dL (ref 6–23)
CO2: 27 mEq/L (ref 19–32)
Calcium: 8.9 mg/dL (ref 8.4–10.5)
Chloride: 101 mEq/L (ref 96–112)
Creatinine, Ser: 0.68 mg/dL (ref 0.50–1.10)
GFR calc Af Amer: >90 mL/min (ref >90)
GFR calc non Af Amer: 87 mL/min — ABNORMAL LOW (ref >90)
Glucose, Bld: 91 mg/dL (ref 70–99)
Potassium: 4.4 mEq/L (ref 3.5–5.1)
Sodium: 135 mEq/L (ref 135–145)
Total Bilirubin: 0.5 mg/dL (ref 0.3–1.2)
Total Protein: 7.9 g/dL (ref 6.0–8.3)

## 2013-01-25 NOTE — Progress Notes (Signed)
1040 Checked with Dr. Macarthur Critchley in the anesthesia department re. needed labs if all that was needed would be a Cmet for liver disease that it would cover the Bmet needed for the HTN and diabetes and that she needed and EKG. Given an okay to do the Cmet and EKG today.

## 2013-01-25 NOTE — Pre-Procedure Instructions (Addendum)
Your procedure is scheduled WU:[id number], January 28, 2013 Report to [facility] Admitting [id number] Call this number if you have problems morning of your procedure:[phone]  Follow all bowel prep instructions per your doctor's orders.  Do not eat or drink anything after midnight the night before your procedure. You may brush your teeth, rinse out your mouth, but no water, no food, no chewing gum, no mints, no candies, no chewing tobacco.  Will not take diabetic medication Metformin on Thursday morning 01-28-2013.     Take these medicines the morning of your procedure with A SIP OF WATER:Norvasc   Please make arrangements for a responsible person to drive you home after the procedure. You cannot go home by cab/taxi. We recommend you have someone with you at home the first 24 hours after your procedure. Driver for procedure is grand daughter Victoria [name] [phone]  LEAVE ALL VALUABLES, JEWELRY, BILLFOLD AT HOME.  NO DENTURES, CONTACT LENSES ALLOWED IN THE ENDOSCOPY ROOM.   YOU MAY WEAR DEODORANT, PLEASE REMOVE ALL JEWELRY, WATCHES RINGS, BODY PIERCINGS AND LEAVE AT HOME.   WOMEN: NO MAKE-UP, LOTIONS PERFUMES

## 2013-01-27 DIAGNOSIS — K7460 Unspecified cirrhosis of liver: Secondary | ICD-10-CM | POA: Diagnosis not present

## 2013-01-27 DIAGNOSIS — B182 Chronic viral hepatitis C: Secondary | ICD-10-CM | POA: Diagnosis not present

## 2013-01-28 ENCOUNTER — Encounter (HOSPITAL_COMMUNITY): Payer: Self-pay | Admitting: Anesthesiology

## 2013-01-28 ENCOUNTER — Encounter (HOSPITAL_COMMUNITY): Payer: Self-pay | Admitting: *Deleted

## 2013-01-28 ENCOUNTER — Encounter (HOSPITAL_COMMUNITY)
Admission: RE | Disposition: A | Discharge status: Home / Self Care | Payer: Self-pay | Source: Ambulatory Visit | Attending: Gastroenterology

## 2013-01-28 ENCOUNTER — Ambulatory Visit (HOSPITAL_COMMUNITY)
Admission: RE | Admit: 2013-01-28 | Discharge: 2013-01-28 | Disposition: A | Discharge status: Home / Self Care | Payer: Medicare Other | Source: Ambulatory Visit | Attending: Gastroenterology | Admitting: Gastroenterology

## 2013-01-28 ENCOUNTER — Encounter (HOSPITAL_COMMUNITY): Payer: Self-pay | Admitting: Gastroenterology

## 2013-01-28 ENCOUNTER — Ambulatory Visit (HOSPITAL_COMMUNITY): Payer: Medicare Other | Admitting: Anesthesiology

## 2013-01-28 DIAGNOSIS — K8020 Calculus of gallbladder without cholecystitis without obstruction: Secondary | ICD-10-CM | POA: Diagnosis not present

## 2013-01-28 DIAGNOSIS — Z79899 Other long term (current) drug therapy: Secondary | ICD-10-CM | POA: Insufficient documentation

## 2013-01-28 DIAGNOSIS — E119 Type 2 diabetes mellitus without complications: Secondary | ICD-10-CM | POA: Diagnosis not present

## 2013-01-28 DIAGNOSIS — R935 Abnormal findings on diagnostic imaging of other abdominal regions, including retroperitoneum: Secondary | ICD-10-CM | POA: Diagnosis not present

## 2013-01-28 DIAGNOSIS — R9389 Abnormal findings on diagnostic imaging of other specified body structures: Secondary | ICD-10-CM

## 2013-01-28 DIAGNOSIS — K863 Pseudocyst of pancreas: Secondary | ICD-10-CM | POA: Insufficient documentation

## 2013-01-28 DIAGNOSIS — I10 Essential (primary) hypertension: Secondary | ICD-10-CM | POA: Diagnosis not present

## 2013-01-28 DIAGNOSIS — K862 Cyst of pancreas: Secondary | ICD-10-CM | POA: Diagnosis not present

## 2013-01-28 DIAGNOSIS — E669 Obesity, unspecified: Secondary | ICD-10-CM | POA: Insufficient documentation

## 2013-01-28 DIAGNOSIS — K859 Acute pancreatitis without necrosis or infection, unspecified: Secondary | ICD-10-CM

## 2013-01-28 DIAGNOSIS — R932 Abnormal findings on diagnostic imaging of liver and biliary tract: Secondary | ICD-10-CM | POA: Diagnosis not present

## 2013-01-28 HISTORY — DX: Thrombocytopenia, unspecified: D69.6

## 2013-01-28 HISTORY — DX: Unspecified cirrhosis of liver: K74.60

## 2013-01-28 HISTORY — DX: Calculus of gallbladder without cholecystitis without obstruction: K80.20

## 2013-01-28 HISTORY — PX: EUS: SHX5427

## 2013-01-28 HISTORY — DX: Acute pancreatitis without necrosis or infection, unspecified: K85.90

## 2013-01-28 HISTORY — DX: Unspecified viral hepatitis C without hepatic coma: B19.20

## 2013-01-28 LAB — PANC CYST FLD ANLYS-PATHFNDR-TG

## 2013-01-28 SURGERY — UPPER ENDOSCOPIC ULTRASOUND (EUS) LINEAR
Anesthesia: Monitor Anesthesia Care

## 2013-01-28 MED ORDER — LACTATED RINGERS IV SOLN
INTRAVENOUS | Status: DC | PRN
  Administered 2013-01-28: 07:00:00 via INTRAVENOUS

## 2013-01-28 MED ORDER — CIPROFLOXACIN IN D5W 400 MG/200ML IV SOLN
INTRAVENOUS | Status: DC | PRN
  Administered 2013-01-28: 400 mg via INTRAVENOUS

## 2013-01-28 MED ORDER — METOCLOPRAMIDE HCL 5 MG/ML IJ SOLN: 10.0000 mg | Freq: Once | INTRAMUSCULAR | Status: DC | PRN

## 2013-01-28 MED ORDER — CIPROFLOXACIN HCL 500 MG PO TABS: 500.0000 mg | ORAL_TABLET | Freq: Two times a day (BID) | ORAL | Status: DC

## 2013-01-28 MED ORDER — PROPOFOL INFUSION 10 MG/ML OPTIME
INTRAVENOUS | Status: DC | PRN
  Administered 2013-01-28: 100 ug/kg/min via INTRAVENOUS

## 2013-01-28 MED ORDER — BUTAMBEN-TETRACAINE-BENZOCAINE 2-2-14 % EX AERO
INHALATION_SPRAY | CUTANEOUS | Status: DC | PRN
  Administered 2013-01-28: 2 via TOPICAL

## 2013-01-28 MED ORDER — SODIUM CHLORIDE 0.9 % IV SOLN: INTRAVENOUS | Status: DC

## 2013-01-28 MED ORDER — KETAMINE HCL 10 MG/ML IJ SOLN
INTRAMUSCULAR | Status: DC | PRN
  Administered 2013-01-28 (×3): 10 mg via INTRAVENOUS

## 2013-01-28 MED ORDER — CIPROFLOXACIN IN D5W 400 MG/200ML IV SOLN
INTRAVENOUS | Status: AC
  Filled 2013-01-28: qty 200

## 2013-01-28 MED ORDER — FENTANYL CITRATE 0.05 MG/ML IJ SOLN: 25.0000 ug | INTRAMUSCULAR | Status: DC | PRN

## 2013-01-28 MED ORDER — MIDAZOLAM HCL 5 MG/5ML IJ SOLN
INTRAMUSCULAR | Status: DC | PRN
  Administered 2013-01-28: 2 mg via INTRAVENOUS

## 2013-01-28 NOTE — Anesthesia Preprocedure Evaluation (Signed)
Anesthesia Evaluation  Patient identified by MRN, date of birth, ID band Patient awake    Reviewed: Allergy & Precautions, H&P , NPO status , Patient's Chart, lab work & pertinent test results, reviewed documented beta blocker date and time   Airway Mallampati: II TM Distance: >3 FB Neck ROM: full    Dental   Pulmonary neg pulmonary ROS,  breath sounds clear to auscultation        Cardiovascular hypertension, On Medications Rhythm:regular     Neuro/Psych negative neurological ROS  negative psych ROS   GI/Hepatic negative GI ROS, (+) Hepatitis -, C  Endo/Other  diabetes, Oral Hypoglycemic Agents  Renal/GU negative Renal ROS  negative genitourinary   Musculoskeletal   Abdominal   Peds  Hematology negative hematology ROS (+)   Anesthesia Other Findings See surgeon's H&P   Reproductive/Obstetrics negative OB ROS                           Anesthesia Physical Anesthesia Plan  ASA: III  Anesthesia Plan: MAC   Post-op Pain Management:    Induction: Intravenous  Airway Management Planned: Nasal Cannula and Simple Face Mask  Additional Equipment:   Intra-op Plan:   Post-operative Plan:   Informed Consent: I have reviewed the patients History and Physical, chart, labs and discussed the procedure including the risks, benefits and alternatives for the proposed anesthesia with the patient or authorized representative who has indicated his/her understanding and acceptance.   Dental Advisory Given  Plan Discussed with: CRNA and Surgeon  Anesthesia Plan Comments:         Anesthesia Quick Evaluation

## 2013-01-28 NOTE — H&P (View-Only) (Signed)
History of Present Illness: This is a posthospitalization visit for Victoria [name].  She was hospitalized with acute pancreatitis. Etiology was not determined. Incidentally noted by CT and MRI is a probable IPMN.  She's also noted to have hepatitis C. Patient no longer has pain. Energy level is slowly increasing. Appetite is fair.    Past Medical History  Diagnosis Date  . Hypertension   . Diabetes mellitus   . Abnormal finding on Pap smear   . Obesity   . Cancer     breast   Past Surgical History  Procedure Date  . Thyroidectomy, partial 1972    left  . Breast reduction surgery 2005  . Cataract extraction, bilateral   . Breast lumpectomy right   family history includes Diabetes in her mother and Stroke in her mother. Current Outpatient Prescriptions  Medication Sig Dispense Refill  . albuterol (PROVENTIL HFA;VENTOLIN HFA) 108 (90 BASE) MCG/ACT inhaler Inhale 2 puffs into the lungs every 6 (six) hours as needed. Wheezing and shortness of breath      . amLODipine (NORVASC) 2.5 MG tablet Take 2.5 mg by mouth at bedtime.      . cholecalciferol (VITAMIN D) 1000 UNITS tablet Take 1,000 Units by mouth daily.      . hydrOXYzine (ATARAX/VISTARIL) 25 MG tablet Take 1 tablet (25 mg total) by mouth 3 (three) times daily as needed for itching.  30 tablet  0  . ibuprofen (ADVIL,MOTRIN) 200 MG tablet Take 400 mg by mouth every 6 (six) hours as needed. For pain      . losartan (COZAAR) 100 MG tablet Take 100 mg by mouth daily before breakfast.      . metFORMIN (GLUCOPHAGE) 500 MG tablet Take 500 mg by mouth 2 (two) times daily.      . Skin Protectants, Misc. (EUCERIN) cream Apply 1 application topically as needed. All over the body for dry skin       Allergies as of 01/19/2013 - Review Complete 01/19/2013  Allergen Reaction Noted  . Lisinopril Cough 10/01/2011    reports that she has never smoked. She has never used smokeless tobacco. She reports that she does not drink alcohol or use illicit  drugs.     Review of Systems: Pertinent positive and negative review of systems were noted in the above HPI section. All other review of systems were otherwise negative.  Vital signs were reviewed in today's medical record Physical Exam: General: Well developed , well nourished, no acute distress Skin: anicteric Head: Normocephalic and atraumatic Eyes:  sclerae anicteric, EOMI Ears: Normal auditory acuity Mouth: No deformity or lesions Neck: Supple, no masses or thyromegaly Lungs: Clear throughout to auscultation Heart: Regular rate and rhythm; no murmurs, rubs or bruits Abdomen: Soft, non tender and non distended. No masses, hepatosplenomegaly or hernias noted. Normal Bowel sounds Rectal:deferred Musculoskeletal: Symmetrical with no gross deformities  Skin: No lesions on visible extremities Pulses:  Normal pulses noted Extremities: No clubbing, cyanosis, edema or deformities noted Neurological: Alert oriented x 4, grossly nonfocal Cervical Nodes:  No significant cervical adenopathy Inguinal Nodes: No significant inguinal adenopathy Psychological:  Alert and cooperative. Normal mood and affect       

## 2013-01-28 NOTE — Op Note (Signed)
[facility] [address] [place], [address]   ENDOSCOPIC ULTRASOUND PROCEDURE REPORT PATIENT: Victoria [name], Victoria [name]  MR#: [id number] BIRTHDATE: January 01, 1941  GENDER: Female ENDOSCOPIST: [name], MD REFERRED BY:  Louis Meckel, M.D. PROCEDURE DATE:  01/28/2013 PROCEDURE:   Upper EUS w/FNA ASA CLASS:      Class III INDICATIONS:   1.  recent acute pancreatitis; abnormal pancreas on imaging (?IPMN per radiologist reading of MR). MEDICATIONS: MAC sedation, administered by CRNA DESCRIPTION OF PROCEDURE:   After the risks benefits and alternatives of the procedure were  explained, informed consent was obtained. The patient was then placed in the left, lateral, decubitus postion and IV sedation was administered. Throughout the procedure, the patients blood pressure, pulse and oxygen saturations were monitored continuously.  Under direct visualization, the 110036  endoscope was introduced through the mouth  and advanced to the second portion of the duodenum .  Water was used as necessary to provide an acoustic interface.  Upon completion of the imaging, water was removed and the patient was sent to the recovery room in satisfactory condition.  Endoscopic findings: 1. Normal UGI tract EUS findings: 1. Pancreatic parenchyma was diffusely abnormal with hyperechoic stranding and honeycombing throughout. 2. Main pancreatic duct was normal 3. There were 3 small, anechoic fluid collections within the pancreas ranging in size from 5mm (tail) to 2cm in neck. None of the fluid collections had clear communication with main pancreatic duct and non had associated solid masses or mural nodules. I completely aspireated the 9mm cyst in head of pancreas using a single pass with a 22 guage BS EUS FNA needle.  This removed 2cc of clear, slightly thick fluid which was sent for CEA, amylase testing. 4. Gallbladder contained numerous small shadowing stones 5. CBD was normal,  non-dilated 6. Limited views of liver, spleen, portal and splenic vessels were all normal Impression: Likely chronic pancreatitis and I suspect the several small (cystic) fluid collections scatterd throughout the pancreas are related to chronic disease and/or recent mild acute pancreatitis. She has numerous gallstones in gallbladder (seen also by CT and MR) and perhaps her recent acute pancreatitis is related to passage of a stone.  She should be referred to general surgery to consider elective cholecystectomy.  Awaiting final CEA and amylase testing of the cyst fluid that was aspirated. She will complete 3 days of cipro.    _______________________________ eSigned:  [name], MD 01/28/2013 8:16 AM

## 2013-01-28 NOTE — Anesthesia Postprocedure Evaluation (Signed)
Anesthesia Post Note  Patient: Victoria [name]  Procedure(s) Performed: Procedure(s) (LRB): UPPER ENDOSCOPIC ULTRASOUND (EUS) LINEAR (N/A)  Anesthesia type: MAC  Patient location: PACU  Post pain: Pain level controlled  Post assessment: Patient's Cardiovascular Status Stable  Last Vitals:  Filed Vitals:   01/28/13 0812  BP:   Temp: 36.4 C  Resp:     Post vital signs: Reviewed and stable  Level of consciousness: alert  Complications: No apparent anesthesia complications

## 2013-01-28 NOTE — Transfer of Care (Signed)
Immediate Anesthesia Transfer of Care Note  Patient: NEDA WILLENBRING  Procedure(s) Performed: Procedure(s) (LRB) with comments: UPPER ENDOSCOPIC ULTRASOUND (EUS) LINEAR (N/A) - Carlena Bjornstad [phone]  Patient Location: PACU  Anesthesia Type:MAC  Level of Consciousness: awake and alert   Airway & Oxygen Therapy: Patient Spontanous Breathing and Patient connected to nasal cannula oxygen  Post-op Assessment: Report given to PACU RN and Post -op Vital signs reviewed and stable  Post vital signs: Reviewed and stable  Complications: No apparent anesthesia complications

## 2013-01-28 NOTE — Interval H&P Note (Signed)
History and Physical Interval Note:  01/28/2013 7:23 AM  Victoria [name]  has presented today for surgery, with the diagnosis of Pancreatitis [577.0] Abnormal CT scan, pancreas or bile duct [793.3]  The various methods of treatment have been discussed with the patient and family. After consideration of risks, benefits and other options for treatment, the patient has consented to  Procedure(s) (LRB) with comments: UPPER ENDOSCOPIC ULTRASOUND (EUS) LINEAR (N/A) - ride-adrian  [place] [phone] as a surgical intervention .  The patient's history has been reviewed, patient examined, no change in status, stable for surgery.  I have reviewed the patient's chart and labs.  Questions were answered to the patient's satisfaction.     Rob Bunting

## 2013-01-29 ENCOUNTER — Encounter (HOSPITAL_COMMUNITY): Payer: Self-pay | Admitting: Gastroenterology

## 2013-01-29 ENCOUNTER — Other Ambulatory Visit: Payer: Self-pay | Admitting: Internal Medicine

## 2013-01-29 ENCOUNTER — Telehealth: Payer: Self-pay | Admitting: Internal Medicine

## 2013-01-29 DIAGNOSIS — I10 Essential (primary) hypertension: Secondary | ICD-10-CM

## 2013-01-29 MED ORDER — LOSARTAN POTASSIUM-HCTZ 100-25 MG PO TABS: 1.0000 | ORAL_TABLET | Freq: Every day | ORAL | Status: DC

## 2013-01-29 NOTE — Telephone Encounter (Signed)
Patient called requesting refill on Hyzaar. This was refilled for 6 months. Also said she had seen gastroenterologist regarding hepatitis C. Says more lab work was done and she is to followup with him in 4 weeks. Dr. Christella Hartigan did procedure yesterday regarding pancreas and results are pending.

## 2013-02-03 ENCOUNTER — Telehealth: Payer: Self-pay | Admitting: Gastroenterology

## 2013-02-03 NOTE — Telephone Encounter (Signed)
Pt scheduled to see Dr. Arlyce Dice 02/08/13@9 :30am. Pt aware of appt date and time. Pt states she wants to be seen to discuss her results and possible surgery.

## 2013-02-03 NOTE — Telephone Encounter (Signed)
Pancreatic cyst fluid results: CEA 83 ng/mL (low) Amylase 141,548 U/L (very high)  This pattern is almost exclusively seen in pseudocysts and I suspect that is what she has.    Patty, Can you please let her know that there is no sign of cancer in the fluid testing. Dr. Arlyce Dice was planning to see her in office to discuss potential surgical referral (GB removal for gallstones, recent pancreatitis)  Thanks   Rob, FYI on final fluid test results

## 2013-02-03 NOTE — Telephone Encounter (Signed)
Pt has been notified of the results.  She would like to be worked in sooner to see Dr Arlyce Dice.

## 2013-02-06 ENCOUNTER — Other Ambulatory Visit: Payer: Self-pay

## 2013-02-08 ENCOUNTER — Encounter: Payer: Self-pay | Admitting: Gastroenterology

## 2013-02-08 ENCOUNTER — Ambulatory Visit (INDEPENDENT_AMBULATORY_CARE_PROVIDER_SITE_OTHER): Payer: Medicare Other | Admitting: Gastroenterology

## 2013-02-08 VITALS — BP 104/80 | HR 92 | Ht 68.0 in | Wt 232.0 lb

## 2013-02-08 DIAGNOSIS — R933 Abnormal findings on diagnostic imaging of other parts of digestive tract: Secondary | ICD-10-CM | POA: Diagnosis not present

## 2013-02-08 DIAGNOSIS — K859 Acute pancreatitis without necrosis or infection, unspecified: Secondary | ICD-10-CM

## 2013-02-08 DIAGNOSIS — B182 Chronic viral hepatitis C: Secondary | ICD-10-CM

## 2013-02-08 NOTE — Assessment & Plan Note (Signed)
EUS findings consistent with small pancreatic pseudocysts

## 2013-02-08 NOTE — Patient Instructions (Addendum)
You have been scheduled for an appointment with Dr Ezzard Standing at [facility]. Your appointment is on 03/04/2013 at 1:30pm2. Please arrive at 1:00 for registration. Make certain to bring a list of current medications, including any over the counter medications or vitamins. Also bring your co-pay if you have one as well as your insurance cards. [facility] is located at [address]. Should you need to reschedule your appointment, please contact them at [phone].

## 2013-02-08 NOTE — Assessment & Plan Note (Signed)
Followup at the hepatitis C clinic. I will request a liver biopsy at the time of surgery since the question of cirrhosis has been raised.

## 2013-02-08 NOTE — Assessment & Plan Note (Addendum)
I suspect that etiology for acute pancreatitis is gallstones. At the very least I believe that she ought to have a cholecystectomy. Plan to refer to general surgery for this.

## 2013-02-08 NOTE — Progress Notes (Signed)
History of Present Illness:  Patient has returned following EUS that demonstrated small pancreatic cysts. High amylase in the cysts are diagnostic for  pancreatic pseudocyst. She's had no further abdominal pain. She has been seen at the hepatitis C. clinic.    Review of Systems: Pertinent positive and negative review of systems were noted in the above HPI section. All other review of systems were otherwise negative.    Current Medications, Allergies, Past Medical History, Past Surgical History, Family History and Social History were reviewed in [employer] electronic medical record  Vital signs were reviewed in today's medical record. Physical Exam: General: Well developed , well nourished, no acute distress

## 2013-02-17 ENCOUNTER — Telehealth: Payer: Self-pay | Admitting: Gastroenterology

## 2013-02-17 DIAGNOSIS — K8020 Calculus of gallbladder without cholecystitis without obstruction: Secondary | ICD-10-CM

## 2013-02-17 NOTE — Telephone Encounter (Signed)
Pt states that for the past few days she has been having increased weakness. States she does not have much of an appetite and if she does eat something she has nausea and abdominal pain. Pt is scheduled for gallbladder surgery on 03/04/13. Pt wanted to know if there is anything else she can try for the episodes that she is having. Dr. Arlyce Dice please advise.

## 2013-02-18 NOTE — Telephone Encounter (Signed)
Pt aware and orders in epic. 

## 2013-02-18 NOTE — Telephone Encounter (Signed)
We need to check labs including LFTs, amylase and lipase

## 2013-02-22 DIAGNOSIS — Z961 Presence of intraocular lens: Secondary | ICD-10-CM | POA: Diagnosis not present

## 2013-02-25 ENCOUNTER — Other Ambulatory Visit: Payer: Self-pay | Admitting: Internal Medicine

## 2013-03-04 ENCOUNTER — Other Ambulatory Visit (INDEPENDENT_AMBULATORY_CARE_PROVIDER_SITE_OTHER): Payer: Self-pay

## 2013-03-04 ENCOUNTER — Ambulatory Visit (INDEPENDENT_AMBULATORY_CARE_PROVIDER_SITE_OTHER): Payer: Medicare Other | Admitting: Surgery

## 2013-03-04 ENCOUNTER — Encounter (INDEPENDENT_AMBULATORY_CARE_PROVIDER_SITE_OTHER): Payer: Self-pay | Admitting: Surgery

## 2013-03-04 VITALS — BP 144/90 | HR 84 | Resp 18 | Ht 68.0 in | Wt 225.6 lb

## 2013-03-04 DIAGNOSIS — Z853 Personal history of malignant neoplasm of breast: Secondary | ICD-10-CM

## 2013-03-04 DIAGNOSIS — K8020 Calculus of gallbladder without cholecystitis without obstruction: Secondary | ICD-10-CM | POA: Diagnosis not present

## 2013-03-04 NOTE — Progress Notes (Signed)
Re:   Victoria [name] DOB:   01-25-1941 MRN:   [id number]  ASSESSMENT AND PLAN: 1.  Gall stones  I discussed with the patient the indications and risks of gall bladder surgery.  The primary risks of gall bladder surgery include, but are not limited to, bleeding, infection, common bile duct injury, and open surgery.  There is also the risk that the patient may have continued symptoms after surgery.  However, the likelihood of improvement in symptoms and return to the patient's normal status is good. We discussed the typical post-operative recovery course. I tried to answer the patient's questions.  I gave the patient literature about gall bladder surgery.  She has multiple issues and I not sure how much removing her gall bladder will help her symptoms.  But with her history of pancreatitis, I agree with Dr. Arlyce Dice that she needs her gall bladder removed.  She also has cirrhosis noted on CT scan, so cholecystectomy will be much riskier in her.  We discussed her cirrhosis and Hep C.  We will let the Hepatitis clinic see her and get their input about the timing of surgery.  She will call us after she is seen at the Hepatitis Clinic.  2.  Hypertension 3.  Diabetes Mellitus x 2 years  Hgb A1C - 6.6 - 12/28/2012 4.  History of right breast cancer around 2002 - note she has a 1.5 cm nodule (she called it grissle) at the 11 o'clock of the right breast at the edge of the nipple  Unknown surgeon (here).  According to Epic, it was Dr. Cindee Lame.   Reduction mammoplasty by Dr. Shon Hough - 01/02/2004.  Has not had a mammogram in a while.  She has agreed to let us set her up for a mammogram.   5.  Hepatitis C  To see the Hep C clinic - Deborah Chalk, PA.  FAX - [phone].  She was supposed to have been seen last week, but this got delayed.  So she is being seen at the end of March.  We will hold off scheduling surgery until she is seen by the clinic.  Then she will call us and let us know what the clinic  says about proceeding with surgery now or later. 6.  Cirrhosis - AST/ALT mildly elevated.  Liver appears nodular on the CT.    I discussed how this significantly increases the risks of surgery and bleeding.  I can biopsy the liver at the time of GB surgery. 7.  Thrombocytopenia -   Plt - 106,000 - 12/18/2012 8.  Pancreatitic cysts/history of pancreatitis  Seen by Dr. Arlyce Dice 9.  Morbid obesity - BMI - 34  No chief complaint on file.  REFERRING PHYSICIAN:  Dr. Darrel Hoover  HISTORY OF PRESENT ILLNESS: Victoria [name] is a 72 y.o. (DOB: 02-Aug-1941)  AA  female whose primary care physician is [name], MD and comes to me today for gall bladder disease.  The patient comes with a complex history. In December 2013, she presented with epigastric discomfort and pain. She had gastritis about 10 years ago and thought this was the same problem. However evaluation revealed pancreatitis and hepatitis C. A CT scan on 12/14/2012 showed cholelithiasis, a cirrhotic liver, suggestions of acute pancreatitis and an indeterminate mass in the head of the pancreas.   She is being seen by Dr. Barnet Pall for her pancreatitis and hepatitis. Dr. Wendall Papa did an endoscopic ultrasound 01/28/2013 in which he saw cysts in  the pancreas and evidence of chronic pancreatitis.  She continues to have vague epigastric pain, though she is not having it right now.  But she is eating, though nauseated at times.  She thinks that she has lost about 10 pounds.  Her bowels are functioning fairly normally.  She also complains of weakness and just not feeling well.   Past Medical History  Diagnosis Date  . Hypertension   . Diabetes mellitus   . Abnormal finding on Pap smear   . Obesity   . Cancer     breast  . Hepatitis C 11-2012    Active  . Pancreatitis, acute 11-2012  . Cholelithiasis 11-2012  . Cirrhosis 11-2012  . Thrombocytopenia 11-2012  . Gallstones       Past Surgical History  Procedure Laterality Date  .  Thyroidectomy, partial  1972    left  . Breast reduction surgery  2005  . Cataract extraction, bilateral    . Breast lumpectomy  right  . Eus  01/28/2013    Procedure: UPPER ENDOSCOPIC ULTRASOUND (EUS) LINEAR;  Surgeon: [name], MD;  Location: [facility];  Service: Endoscopy;  Laterality: N/A;  ride-adrian  [place] [phone]  . Breast lumpectomy        Current Outpatient Prescriptions  Medication Sig Dispense Refill  . albuterol (PROVENTIL HFA;VENTOLIN HFA) 108 (90 BASE) MCG/ACT inhaler Inhale 2 puffs into the lungs every 6 (six) hours as needed. Wheezing and shortness of breath      . amLODipine (NORVASC) 2.5 MG tablet Take 2.5 mg by mouth at bedtime.      Marland Kitchen amLODipine (NORVASC) 2.5 MG tablet TAKE ONE TABLET BY MOUTH EVERY DAY  90 tablet  0  . cholecalciferol (VITAMIN D) 1000 UNITS tablet Take 1,000 Units by mouth daily.      . hydrOXYzine (ATARAX/VISTARIL) 25 MG tablet Take 1 tablet (25 mg total) by mouth 3 (three) times daily as needed for itching.  30 tablet  0  . ibuprofen (ADVIL,MOTRIN) 200 MG tablet Take 400 mg by mouth every 6 (six) hours as needed. For pain      . losartan (COZAAR) 100 MG tablet TAKE ONE TABLET BY MOUTH EVERY DAY  30 tablet  0  . metFORMIN (GLUCOPHAGE) 500 MG tablet Take 500 mg by mouth 2 (two) times daily.      . Skin Protectants, Misc. (EUCERIN) cream Apply 1 application topically as needed. All over the body for dry skin       No current facility-administered medications for this visit.      Allergies  Allergen Reactions  . Lisinopril Cough    REVIEW OF SYSTEMS: Skin:  No history of rash.  No history of abnormal moles. Infection:  No history of hepatitis or HIV.  No history of MRSA. Neurologic:  No history of stroke.  No history of seizure.  No history of headaches. Cardiac:  Hypertension x 5 years.  No history of heart disease.  No history of prior cardiac catheterization.  No history of seeing a cardiologist. Pulmonary:  Does not smoke  cigarettes.  No asthma or bronchitis.  No OSA/CPAP.  Endocrine:  Diabetes x 2 years  History of left thyroid lobectomy - 1970's. Gastrointestinal:  See HPI. Urologic:  No history of kidney stones.  No history of bladder infections. Musculoskeletal:  No history of joint or back disease. Hematologic:  Thrombocytopenia. Psycho-social:  The patient is oriented.   The patient has no obvious psychologic or social impairment to understanding our  conversation and plan.  SOCIAL and FAMILY HISTORY: Divorced. Lives by self.  Retired from [employer]er" at [employer] Has 3 children.  Has her granddaughter with her.  PHYSICAL EXAM: BP 144/90  Pulse 84  Resp 18  Ht 5\' 8"  (1.727 m)  Wt 225 lb 9.6 oz (102.331 kg)  BMI 34.31 kg/m2  General: Overweight AA F who is alert and generally healthy appearing.  HEENT: Normal. Pupils equal. Neck: Supple. No mass.  No thyroid mass. Lymph Nodes:  No supraclavicular or cervical nodes. Lungs: Clear to auscultation and symmetric breath sounds. Heart:  RRR. No murmur or rub. Breast:  - Right - reduction mammoplasty scar.  1.5 cm nodule at 11 o'clock at areolar edge.  Left - reduction mammoplasty scar.  No obvious mass.  Abdomen: Soft. No mass. No tenderness. No hernia. Normal bowel sounds.  Has striae (like she weight much more at one time).  Some excess skin. Rectal: Not done. Extremities:  Good strength and ROM  in upper and lower extremities. Neurologic:  Grossly intact to motor and sensory function. Psychiatric: Has normal mood and affect.   DATA REVIEWED: Epic notes reviewed and notes from [employer], [employer].  Ovidio Kin, MD,  [facility] [address] [place]  [address]   [place], [place]    [address] Phone:  [phone] FAX:  [phone]

## 2013-03-07 ENCOUNTER — Encounter: Payer: Self-pay | Admitting: Internal Medicine

## 2013-03-11 ENCOUNTER — Other Ambulatory Visit: Payer: Self-pay

## 2013-03-11 MED ORDER — METFORMIN HCL 500 MG PO TABS: 500.0000 mg | ORAL_TABLET | Freq: Two times a day (BID) | ORAL | Status: DC

## 2013-03-16 ENCOUNTER — Telehealth (INDEPENDENT_AMBULATORY_CARE_PROVIDER_SITE_OTHER): Payer: Self-pay

## 2013-03-16 ENCOUNTER — Ambulatory Visit: Payer: Medicare Other | Admitting: Gastroenterology

## 2013-03-16 NOTE — Telephone Encounter (Signed)
Patient aware of appt with [facility] 03/17/13@10 :30 for MMG

## 2013-03-17 ENCOUNTER — Other Ambulatory Visit (INDEPENDENT_AMBULATORY_CARE_PROVIDER_SITE_OTHER): Payer: Self-pay | Admitting: Surgery

## 2013-03-17 ENCOUNTER — Ambulatory Visit
Admission: RE | Admit: 2013-03-17 | Discharge: 2013-03-17 | Disposition: A | Discharge status: Home / Self Care | Payer: Medicare Other | Source: Ambulatory Visit | Attending: Surgery | Admitting: Surgery

## 2013-03-17 DIAGNOSIS — Z853 Personal history of malignant neoplasm of breast: Secondary | ICD-10-CM | POA: Diagnosis not present

## 2013-03-17 DIAGNOSIS — B182 Chronic viral hepatitis C: Secondary | ICD-10-CM | POA: Diagnosis not present

## 2013-03-24 ENCOUNTER — Other Ambulatory Visit (INDEPENDENT_AMBULATORY_CARE_PROVIDER_SITE_OTHER): Payer: Self-pay | Admitting: Surgery

## 2013-03-26 ENCOUNTER — Telehealth (INDEPENDENT_AMBULATORY_CARE_PROVIDER_SITE_OTHER): Payer: Self-pay

## 2013-03-26 NOTE — Telephone Encounter (Signed)
V/M  Sorry I missed her call; Please call if she has questions or concerns

## 2013-03-29 ENCOUNTER — Telehealth (INDEPENDENT_AMBULATORY_CARE_PROVIDER_SITE_OTHER): Payer: Self-pay

## 2013-03-29 ENCOUNTER — Encounter (HOSPITAL_COMMUNITY): Payer: Self-pay | Admitting: *Deleted

## 2013-03-29 NOTE — Telephone Encounter (Signed)
V/M Post op appt 04/16/13 @ 2:30 with Dr. Ezzard Standing

## 2013-03-30 ENCOUNTER — Observation Stay (HOSPITAL_COMMUNITY)
Admission: RE | Admit: 2013-03-30 | Discharge: 2013-03-31 | Disposition: A | Discharge status: Home / Self Care | Payer: Medicare Other | Source: Ambulatory Visit | Attending: Surgery | Admitting: Surgery

## 2013-03-30 ENCOUNTER — Encounter (HOSPITAL_COMMUNITY): Payer: Self-pay | Admitting: *Deleted

## 2013-03-30 ENCOUNTER — Encounter (HOSPITAL_COMMUNITY): Payer: Self-pay | Admitting: Anesthesiology

## 2013-03-30 ENCOUNTER — Ambulatory Visit (HOSPITAL_COMMUNITY): Payer: Medicare Other

## 2013-03-30 ENCOUNTER — Ambulatory Visit (HOSPITAL_COMMUNITY): Payer: Medicare Other | Admitting: Anesthesiology

## 2013-03-30 ENCOUNTER — Encounter (HOSPITAL_COMMUNITY)
Admission: RE | Disposition: A | Discharge status: Home / Self Care | Payer: Self-pay | Source: Ambulatory Visit | Attending: Surgery

## 2013-03-30 ENCOUNTER — Encounter (HOSPITAL_COMMUNITY): Payer: Self-pay | Admitting: Pharmacy Technician

## 2013-03-30 DIAGNOSIS — K7460 Unspecified cirrhosis of liver: Secondary | ICD-10-CM | POA: Diagnosis not present

## 2013-03-30 DIAGNOSIS — E119 Type 2 diabetes mellitus without complications: Secondary | ICD-10-CM | POA: Insufficient documentation

## 2013-03-30 DIAGNOSIS — I10 Essential (primary) hypertension: Secondary | ICD-10-CM | POA: Insufficient documentation

## 2013-03-30 DIAGNOSIS — L909 Atrophic disorder of skin, unspecified: Secondary | ICD-10-CM | POA: Diagnosis not present

## 2013-03-30 DIAGNOSIS — D696 Thrombocytopenia, unspecified: Secondary | ICD-10-CM | POA: Insufficient documentation

## 2013-03-30 DIAGNOSIS — K8020 Calculus of gallbladder without cholecystitis without obstruction: Secondary | ICD-10-CM | POA: Diagnosis not present

## 2013-03-30 DIAGNOSIS — B1920 Unspecified viral hepatitis C without hepatic coma: Secondary | ICD-10-CM | POA: Insufficient documentation

## 2013-03-30 DIAGNOSIS — Z853 Personal history of malignant neoplasm of breast: Secondary | ICD-10-CM | POA: Diagnosis not present

## 2013-03-30 DIAGNOSIS — Z01818 Encounter for other preprocedural examination: Secondary | ICD-10-CM | POA: Diagnosis not present

## 2013-03-30 DIAGNOSIS — Z6834 Body mass index (BMI) 34.0-34.9, adult: Secondary | ICD-10-CM | POA: Diagnosis not present

## 2013-03-30 DIAGNOSIS — K439 Ventral hernia without obstruction or gangrene: Secondary | ICD-10-CM | POA: Diagnosis not present

## 2013-03-30 DIAGNOSIS — Z79899 Other long term (current) drug therapy: Secondary | ICD-10-CM | POA: Insufficient documentation

## 2013-03-30 DIAGNOSIS — K861 Other chronic pancreatitis: Secondary | ICD-10-CM | POA: Diagnosis not present

## 2013-03-30 DIAGNOSIS — R1011 Right upper quadrant pain: Secondary | ICD-10-CM | POA: Diagnosis not present

## 2013-03-30 DIAGNOSIS — K8010 Calculus of gallbladder with chronic cholecystitis without obstruction: Secondary | ICD-10-CM | POA: Diagnosis not present

## 2013-03-30 DIAGNOSIS — L919 Hypertrophic disorder of the skin, unspecified: Secondary | ICD-10-CM | POA: Diagnosis not present

## 2013-03-30 DIAGNOSIS — K7689 Other specified diseases of liver: Secondary | ICD-10-CM | POA: Diagnosis not present

## 2013-03-30 DIAGNOSIS — Q828 Other specified congenital malformations of skin: Secondary | ICD-10-CM | POA: Diagnosis not present

## 2013-03-30 DIAGNOSIS — I700 Atherosclerosis of aorta: Secondary | ICD-10-CM | POA: Diagnosis not present

## 2013-03-30 HISTORY — PX: EXCISION OF SKIN TAG: SHX6270

## 2013-03-30 HISTORY — PX: CHOLECYSTECTOMY: SHX55

## 2013-03-30 HISTORY — PX: LIVER BIOPSY: SHX301

## 2013-03-30 LAB — CBC WITH DIFFERENTIAL/PLATELET
Basophils Absolute: 0 10*3/uL (ref 0.0–0.1)
Basophils Relative: 0 % (ref 0–1)
Eosinophils Absolute: 0.1 10*3/uL (ref 0.0–0.7)
Eosinophils Relative: 1 % (ref 0–5)
HCT: 38.6 % (ref 36.0–46.0)
Hemoglobin: 13 g/dL (ref 12.0–15.0)
Lymphocytes Relative: 50 % — ABNORMAL HIGH (ref 12–46)
Lymphs Abs: 2.7 10*3/uL (ref 0.7–4.0)
MCH: 28.8 pg (ref 26.0–34.0)
MCHC: 33.7 g/dL (ref 30.0–36.0)
MCV: 85.4 fL (ref 78.0–100.0)
Monocytes Absolute: 0.3 10*3/uL (ref 0.1–1.0)
Monocytes Relative: 6 % (ref 3–12)
Neutro Abs: 2.3 10*3/uL (ref 1.7–7.7)
Neutrophils Relative %: 43 % (ref 43–77)
Platelets: 127 10*3/uL — ABNORMAL LOW (ref 150–400)
RBC: 4.52 MIL/uL (ref 3.87–5.11)
RDW: 14.3 % (ref 11.5–15.5)
WBC: 5.3 10*3/uL (ref 4.0–10.5)

## 2013-03-30 LAB — GLUCOSE, CAPILLARY
Glucose-Capillary: 118 mg/dL — ABNORMAL HIGH (ref 70–99)
Glucose-Capillary: 108 mg/dL — ABNORMAL HIGH (ref 70–99)

## 2013-03-30 LAB — COMPREHENSIVE METABOLIC PANEL
ALT: 45 U/L — ABNORMAL HIGH (ref 0–35)
AST: 49 U/L — ABNORMAL HIGH (ref 0–37)
Albumin: 3.1 g/dL — ABNORMAL LOW (ref 3.5–5.2)
Alkaline Phosphatase: 75 U/L (ref 39–117)
BUN: 14 mg/dL (ref 6–23)
CO2: 29 mEq/L (ref 19–32)
Calcium: 9.2 mg/dL (ref 8.4–10.5)
Chloride: 102 mEq/L (ref 96–112)
Creatinine, Ser: 0.81 mg/dL (ref 0.50–1.10)
GFR calc Af Amer: 84 mL/min — ABNORMAL LOW (ref >90)
GFR calc non Af Amer: 72 mL/min — ABNORMAL LOW (ref >90)
Glucose, Bld: 133 mg/dL — ABNORMAL HIGH (ref 70–99)
Potassium: 4.5 mEq/L (ref 3.5–5.1)
Sodium: 138 mEq/L (ref 135–145)
Total Bilirubin: 0.6 mg/dL (ref 0.3–1.2)
Total Protein: 7 g/dL (ref 6.0–8.3)

## 2013-03-30 LAB — SURGICAL PCR SCREEN
MRSA, PCR: INVALID — AB
Staphylococcus aureus: INVALID — AB

## 2013-03-30 SURGERY — LAPAROSCOPIC CHOLECYSTECTOMY WITH INTRAOPERATIVE CHOLANGIOGRAM
Anesthesia: General | Site: Face | Laterality: Right | Wound class: Clean Contaminated

## 2013-03-30 MED ORDER — HYDROMORPHONE HCL PF 1 MG/ML IJ SOLN
0.5000 mg | INTRAMUSCULAR | Status: DC | PRN
  Administered 2013-03-30 (×2): 0.5 mg via INTRAVENOUS

## 2013-03-30 MED ORDER — PHENYLEPHRINE HCL 10 MG/ML IJ SOLN
INTRAMUSCULAR | Status: DC | PRN
  Administered 2013-03-30: 120 ug via INTRAVENOUS

## 2013-03-30 MED ORDER — MORPHINE SULFATE 10 MG/ML IJ SOLN
1.0000 mg | INTRAMUSCULAR | Status: DC | PRN
  Administered 2013-03-30 – 2013-03-31 (×3): 2 mg via INTRAVENOUS
  Filled 2013-03-30 (×3): qty 1

## 2013-03-30 MED ORDER — KETOROLAC TROMETHAMINE 30 MG/ML IJ SOLN: 15.0000 mg | Freq: Once | INTRAMUSCULAR | Status: DC | PRN

## 2013-03-30 MED ORDER — SILVER NITRATE-POT NITRATE 75-25 % EX MISC
CUTANEOUS | Status: DC | PRN
  Administered 2013-03-30: 1 via TOPICAL

## 2013-03-30 MED ORDER — CEFAZOLIN SODIUM-DEXTROSE 2-3 GM-% IV SOLR
2.0000 g | INTRAVENOUS | Status: AC
  Administered 2013-03-30: 2 g via INTRAVENOUS

## 2013-03-30 MED ORDER — ONDANSETRON HCL 4 MG PO TABS: 4.0000 mg | ORAL_TABLET | Freq: Four times a day (QID) | ORAL | Status: DC | PRN

## 2013-03-30 MED ORDER — LIDOCAINE HCL (CARDIAC) 20 MG/ML IV SOLN
INTRAVENOUS | Status: DC | PRN
  Administered 2013-03-30: 60 mg via INTRAVENOUS

## 2013-03-30 MED ORDER — OXYCODONE HCL 5 MG PO TABS
5.0000 mg | ORAL_TABLET | ORAL | Status: DC | PRN
  Administered 2013-03-31: 5 mg via ORAL
  Filled 2013-03-30: qty 1

## 2013-03-30 MED ORDER — PROMETHAZINE HCL 25 MG/ML IJ SOLN: 6.2500 mg | INTRAMUSCULAR | Status: DC | PRN

## 2013-03-30 MED ORDER — POTASSIUM CHLORIDE IN NACL 20-0.45 MEQ/L-% IV SOLN
INTRAVENOUS | Status: DC
  Administered 2013-03-30: 17:00:00 via INTRAVENOUS
  Filled 2013-03-30 (×3): qty 1000

## 2013-03-30 MED ORDER — HEPARIN SODIUM (PORCINE) 5000 UNIT/ML IJ SOLN
5000.0000 [IU] | Freq: Three times a day (TID) | INTRAMUSCULAR | Status: DC
  Administered 2013-03-30 – 2013-03-31 (×2): 5000 [IU] via SUBCUTANEOUS
  Filled 2013-03-30 (×5): qty 1

## 2013-03-30 MED ORDER — MUPIROCIN 2 % EX OINT
TOPICAL_OINTMENT | Freq: Once | CUTANEOUS | Status: AC
  Administered 2013-03-30: 1 via NASAL
  Filled 2013-03-30: qty 22

## 2013-03-30 MED ORDER — METFORMIN HCL 500 MG PO TABS
500.0000 mg | ORAL_TABLET | Freq: Two times a day (BID) | ORAL | Status: DC
  Administered 2013-03-30 – 2013-03-31 (×2): 500 mg via ORAL
  Filled 2013-03-30 (×4): qty 1

## 2013-03-30 MED ORDER — DIATRIZOATE MEGLUMINE 30 % UR SOLN
URETHRAL | Status: DC | PRN
  Administered 2013-03-30: 300 mL via URETHRAL

## 2013-03-30 MED ORDER — LIDOCAINE-EPINEPHRINE 1 %-1:100000 IJ SOLN
INTRAMUSCULAR | Status: DC | PRN
  Administered 2013-03-30: 1 mL

## 2013-03-30 MED ORDER — FENTANYL CITRATE 0.05 MG/ML IJ SOLN
25.0000 ug | INTRAMUSCULAR | Status: DC | PRN
  Administered 2013-03-30: 25 ug via INTRAVENOUS
  Administered 2013-03-30 (×2): 50 ug via INTRAVENOUS
  Administered 2013-03-30: 25 ug via INTRAVENOUS

## 2013-03-30 MED ORDER — ONDANSETRON HCL 4 MG/2ML IJ SOLN
INTRAMUSCULAR | Status: DC | PRN
  Administered 2013-03-30: 4 mg via INTRAVENOUS

## 2013-03-30 MED ORDER — PROPOFOL 10 MG/ML IV BOLUS
INTRAVENOUS | Status: DC | PRN
  Administered 2013-03-30: 200 mg via INTRAVENOUS

## 2013-03-30 MED ORDER — ONDANSETRON HCL 4 MG/2ML IJ SOLN: 4.0000 mg | Freq: Four times a day (QID) | INTRAMUSCULAR | Status: DC | PRN

## 2013-03-30 MED ORDER — LOSARTAN POTASSIUM 50 MG PO TABS
100.0000 mg | ORAL_TABLET | Freq: Every morning | ORAL | Status: DC
  Administered 2013-03-30 – 2013-03-31 (×2): 100 mg via ORAL
  Filled 2013-03-30 (×2): qty 2

## 2013-03-30 MED ORDER — MIDAZOLAM HCL 5 MG/5ML IJ SOLN
INTRAMUSCULAR | Status: DC | PRN
  Administered 2013-03-30: 2 mg via INTRAVENOUS

## 2013-03-30 MED ORDER — SUCCINYLCHOLINE CHLORIDE 20 MG/ML IJ SOLN
INTRAMUSCULAR | Status: DC | PRN
  Administered 2013-03-30: 100 mg via INTRAVENOUS

## 2013-03-30 MED ORDER — VITAMIN D3 25 MCG (1000 UNIT) PO TABS
1000.0000 [IU] | ORAL_TABLET | Freq: Every day | ORAL | Status: DC
  Administered 2013-03-30 – 2013-03-31 (×2): 1000 [IU] via ORAL
  Filled 2013-03-30 (×2): qty 1

## 2013-03-30 MED ORDER — ALBUTEROL SULFATE HFA 108 (90 BASE) MCG/ACT IN AERS
2.0000 | INHALATION_SPRAY | Freq: Four times a day (QID) | RESPIRATORY_TRACT | Status: DC | PRN
  Administered 2013-03-30: 2 via RESPIRATORY_TRACT
  Filled 2013-03-30: qty 6.7

## 2013-03-30 MED ORDER — LACTATED RINGERS IV SOLN
INTRAVENOUS | Status: DC
  Administered 2013-03-30 (×2): 1000 mL via INTRAVENOUS

## 2013-03-30 MED ORDER — GLYCOPYRROLATE 0.2 MG/ML IJ SOLN
INTRAMUSCULAR | Status: DC | PRN
  Administered 2013-03-30: 0.4 mg via INTRAVENOUS

## 2013-03-30 MED ORDER — NEOSTIGMINE METHYLSULFATE 1 MG/ML IJ SOLN
INTRAMUSCULAR | Status: DC | PRN
  Administered 2013-03-30: 3 mg via INTRAVENOUS

## 2013-03-30 MED ORDER — CHLORHEXIDINE GLUCONATE 4 % EX LIQD: 1.0000 "application " | Freq: Once | CUTANEOUS | Status: DC

## 2013-03-30 MED ORDER — AMLODIPINE BESYLATE 2.5 MG PO TABS
2.5000 mg | ORAL_TABLET | Freq: Every day | ORAL | Status: DC
  Administered 2013-03-30: 2.5 mg via ORAL
  Filled 2013-03-30 (×2): qty 1

## 2013-03-30 MED ORDER — LACTATED RINGERS IV SOLN
INTRAVENOUS | Status: DC | PRN
  Administered 2013-03-30 (×2): via INTRAVENOUS

## 2013-03-30 MED ORDER — BUPIVACAINE HCL (PF) 0.25 % IJ SOLN
INTRAMUSCULAR | Status: DC | PRN
  Administered 2013-03-30: 30 mL

## 2013-03-30 MED ORDER — FENTANYL CITRATE 0.05 MG/ML IJ SOLN
INTRAMUSCULAR | Status: DC | PRN
  Administered 2013-03-30: 50 ug via INTRAVENOUS
  Administered 2013-03-30 (×2): 100 ug via INTRAVENOUS

## 2013-03-30 MED ORDER — CISATRACURIUM BESYLATE (PF) 10 MG/5ML IV SOLN
INTRAVENOUS | Status: DC | PRN
  Administered 2013-03-30: 5 mg via INTRAVENOUS

## 2013-03-30 SURGICAL SUPPLY — 48 items
ADH SKN CLS APL DERMABOND .7 (GAUZE/BANDAGES/DRESSINGS) ×6
APL SKNCLS STERI-STRIP NONHPOA (GAUZE/BANDAGES/DRESSINGS)
APPLIER CLIP ROT 10 11.4 M/L (STAPLE) ×4
APR CLP MED LRG 11.4X10 (STAPLE) ×3
BAG SPEC RTRVL LRG 6X4 10 (ENDOMECHANICALS) ×3
BENZOIN TINCTURE PRP APPL 2/3 (GAUZE/BANDAGES/DRESSINGS) ×3 IMPLANT
CANISTER SUCTION 2500CC (MISCELLANEOUS) ×4 IMPLANT
CHLORAPREP W/TINT 26ML (MISCELLANEOUS) ×4 IMPLANT
CHOLANGIOGRAM CATH TAUT (CATHETERS) ×4 IMPLANT
CLIP APPLIE ROT 10 11.4 M/L (STAPLE) ×3 IMPLANT
CLOTH BEACON ORANGE TIMEOUT ST (SAFETY) ×4 IMPLANT
COVER MAYO STAND STRL (DRAPES) ×1 IMPLANT
DECANTER SPIKE VIAL GLASS SM (MISCELLANEOUS) ×3 IMPLANT
DERMABOND ADVANCED (GAUZE/BANDAGES/DRESSINGS) ×2
DERMABOND ADVANCED .7 DNX12 (GAUZE/BANDAGES/DRESSINGS) IMPLANT
DRAPE C-ARM 42X72 X-RAY (DRAPES) ×1 IMPLANT
DRAPE LAPAROSCOPIC ABDOMINAL (DRAPES) ×4 IMPLANT
ELECT REM PT RETURN 9FT ADLT (ELECTROSURGICAL) ×4
ELECTRODE REM PT RTRN 9FT ADLT (ELECTROSURGICAL) ×3 IMPLANT
GLOVE BIOGEL PI IND STRL 7.0 (GLOVE) ×3 IMPLANT
GLOVE BIOGEL PI INDICATOR 7.0 (GLOVE) ×1
GLOVE SURG SIGNA 7.5 PF LTX (GLOVE) ×4 IMPLANT
GOWN STRL NON-REIN LRG LVL3 (GOWN DISPOSABLE) ×3 IMPLANT
GOWN STRL REIN XL XLG (GOWN DISPOSABLE) ×10 IMPLANT
HEMOSTAT SURGICEL 4X8 (HEMOSTASIS) IMPLANT
IV CATH 14GX2 1/4 (CATHETERS) ×4 IMPLANT
IV SET EXT 30 76VOL 4 MALE LL (IV SETS) ×4 IMPLANT
KIT BASIN OR (CUSTOM PROCEDURE TRAY) ×4 IMPLANT
NDL BIOPSY 14X6 SOFT TISS (NEEDLE) IMPLANT
NEEDLE BIOPSY 14X6 SOFT TISS (NEEDLE) ×4 IMPLANT
NS IRRIG 1000ML POUR BTL (IV SOLUTION) ×1 IMPLANT
POUCH SPECIMEN RETRIEVAL 10MM (ENDOMECHANICALS) ×1 IMPLANT
SET IRRIG TUBING LAPAROSCOPIC (IRRIGATION / IRRIGATOR) ×4 IMPLANT
SLEEVE XCEL OPT CAN 5 100 (ENDOMECHANICALS) ×1 IMPLANT
SOLUTION ANTI FOG 6CC (MISCELLANEOUS) ×4 IMPLANT
STOPCOCK K 69 2C6206 (IV SETS) ×4 IMPLANT
STRIP CLOSURE SKIN 1/4X4 (GAUZE/BANDAGES/DRESSINGS) ×3 IMPLANT
SUT VIC AB 5-0 PS2 18 (SUTURE) ×4 IMPLANT
TOWEL OR 17X26 10 PK STRL BLUE (TOWEL DISPOSABLE) ×11 IMPLANT
TRAY LAP CHOLE (CUSTOM PROCEDURE TRAY) ×4 IMPLANT
TROCAR XCEL BLUNT TIP 100MML (ENDOMECHANICALS) ×4 IMPLANT
TROCAR XCEL NON-BLD 11X100MML (ENDOMECHANICALS) ×1 IMPLANT
TROCAR XCEL NON-BLD 5MMX100MML (ENDOMECHANICALS) ×1 IMPLANT
TROCAR Z-THREAD FIOS 11X100 BL (TROCAR) ×3 IMPLANT
TROCAR Z-THREAD FIOS 5X100MM (TROCAR) ×9 IMPLANT
TROCAR Z-THREAD SLEEVE 11X100 (TROCAR) IMPLANT
TUBING INSUFFLATION 10FT LAP (TUBING) ×4 IMPLANT
WATER STERILE IRR 1500ML POUR (IV SOLUTION) ×3 IMPLANT

## 2013-03-30 NOTE — Anesthesia Preprocedure Evaluation (Addendum)
Anesthesia Evaluation  Patient identified by MRN, date of birth, ID band Patient awake    Reviewed: Allergy & Precautions, H&P , NPO status , Patient's Chart, lab work & pertinent test results  Airway Mallampati: II TM Distance: <3 FB Neck ROM: Full    Dental no notable dental hx.    Pulmonary neg pulmonary ROS,  breath sounds clear to auscultation  Pulmonary exam normal       Cardiovascular hypertension, Pt. on medications Rhythm:Regular Rate:Normal     Neuro/Psych negative neurological ROS  negative psych ROS   GI/Hepatic negative GI ROS, (+) Cirrhosis -       , Hepatitis -, C  Endo/Other  diabetesMorbid obesity  Renal/GU negative Renal ROS  negative genitourinary   Musculoskeletal negative musculoskeletal ROS (+)   Abdominal   Peds negative pediatric ROS (+)  Hematology negative hematology ROS (+)   Anesthesia Other Findings   Reproductive/Obstetrics negative OB ROS                          Anesthesia Physical Anesthesia Plan  ASA: III  Anesthesia Plan: General   Post-op Pain Management:    Induction: Intravenous  Airway Management Planned: Oral ETT  Additional Equipment:   Intra-op Plan:   Post-operative Plan: Extubation in OR  Informed Consent: I have reviewed the patients History and Physical, chart, labs and discussed the procedure including the risks, benefits and alternatives for the proposed anesthesia with the patient or authorized representative who has indicated his/her understanding and acceptance.   Dental advisory given  Plan Discussed with: CRNA and Surgeon  Anesthesia Plan Comments:         Anesthesia Quick Evaluation

## 2013-03-30 NOTE — Transfer of Care (Signed)
Immediate Anesthesia Transfer of Care Note  Patient: Victoria [name]  Procedure(s) Performed: Procedure(s): LAPAROSCOPIC CHOLECYSTECTOMY WITH INTRAOPERATIVE CHOLANGIOGRAM (N/A) LIVER BIOPSY EXCISION OF SKIN TAG (Right)  Patient Location: PACU  Anesthesia Type:General  Level of Consciousness: sedated, patient cooperative and responds to stimulation  Airway & Oxygen Therapy: Patient Spontanous Breathing and Patient connected to face mask oxygen  Post-op Assessment: Report given to PACU RN, Post -op Vital signs reviewed and unstable, Anesthesiologist notified and Patient moving all extremities X 4  Post vital signs: Reviewed and stable  Complications: No apparent anesthesia complications

## 2013-03-30 NOTE — Preoperative (Signed)
Beta Blockers   Reason not to administer Beta Blockers:Not Applicable 

## 2013-03-30 NOTE — H&P (View-Only) (Signed)
Re:   Victoria [name] DOB:   June 30, 1941 MRN:   [id number]  ASSESSMENT AND PLAN: 1.  Gall stones  I discussed with the patient the indications and risks of gall bladder surgery.  The primary risks of gall bladder surgery include, but are not limited to, bleeding, infection, common bile duct injury, and open surgery.  There is also the risk that the patient may have continued symptoms after surgery.  However, the likelihood of improvement in symptoms and return to the patient's normal status is good. We discussed the typical post-operative recovery course. I tried to answer the patient's questions.  I gave the patient literature about gall bladder surgery.  She has multiple issues and I not sure how much removing her gall bladder will help her symptoms.  But with her history of pancreatitis, I agree with Dr. Arlyce Dice that she needs her gall bladder removed.  She also has cirrhosis noted on CT scan, so cholecystectomy will be much riskier in her.  We discussed her cirrhosis and Hep C.  We will let the Hepatitis clinic see her and get their input about the timing of surgery.  She will call us after she is seen at the Hepatitis Clinic.  2.  Hypertension 3.  Diabetes Mellitus x 2 years  Hgb A1C - 6.6 - 12/28/2012 4.  History of right breast cancer around 2002 - note she has a 1.5 cm nodule (she called it grissle) at the 11 o'clock of the right breast at the edge of the nipple  Unknown surgeon (here).  According to Epic, it was Dr. Cindee Lame.   Reduction mammoplasty by Dr. Shon Hough - 01/02/2004.  Has not had a mammogram in a while.  She has agreed to let us set her up for a mammogram.   5.  Hepatitis C  To see the Hep C clinic - Deborah Chalk, PA.  FAX - [phone].  She was supposed to have been seen last week, but this got delayed.  So she is being seen at the end of March.  We will hold off scheduling surgery until she is seen by the clinic.  Then she will call us and let us know what the clinic  says about proceeding with surgery now or later. 6.  Cirrhosis - AST/ALT mildly elevated.  Liver appears nodular on the CT.    I discussed how this significantly increases the risks of surgery and bleeding.  I can biopsy the liver at the time of GB surgery. 7.  Thrombocytopenia -   Plt - 106,000 - 12/18/2012 8.  Pancreatitic cysts/history of pancreatitis  Seen by Dr. Arlyce Dice 9.  Morbid obesity - BMI - 34  No chief complaint on file.  REFERRING PHYSICIAN:  Dr. Darrel Hoover  HISTORY OF PRESENT ILLNESS: Victoria [name] is a 72 y.o. (DOB: 04/26/1941)  AA  female whose primary care physician is [name], MD and comes to me today for gall bladder disease.  The patient comes with a complex history. In December 2013, she presented with epigastric discomfort and pain. She had gastritis about 10 years ago and thought this was the same problem. However evaluation revealed pancreatitis and hepatitis C. A CT scan on 12/14/2012 showed cholelithiasis, a cirrhotic liver, suggestions of acute pancreatitis and an indeterminate mass in the head of the pancreas.   She is being seen by Dr. Barnet Pall for her pancreatitis and hepatitis. Dr. Wendall Papa did an endoscopic ultrasound 01/28/2013 in which he saw cysts in  the pancreas and evidence of chronic pancreatitis.  She continues to have vague epigastric pain, though she is not having it right now.  But she is eating, though nauseated at times.  She thinks that she has lost about 10 pounds.  Her bowels are functioning fairly normally.  She also complains of weakness and just not feeling well.   Past Medical History  Diagnosis Date  . Hypertension   . Diabetes mellitus   . Abnormal finding on Pap smear   . Obesity   . Cancer     breast  . Hepatitis C 11-2012    Active  . Pancreatitis, acute 11-2012  . Cholelithiasis 11-2012  . Cirrhosis 11-2012  . Thrombocytopenia 11-2012  . Gallstones       Past Surgical History  Procedure Laterality Date  .  Thyroidectomy, partial  1972    left  . Breast reduction surgery  2005  . Cataract extraction, bilateral    . Breast lumpectomy  right  . Eus  01/28/2013    Procedure: UPPER ENDOSCOPIC ULTRASOUND (EUS) LINEAR;  Surgeon: [name], MD;  Location: [facility];  Service: Endoscopy;  Laterality: N/A;  ride-adrian  [place] [phone]  . Breast lumpectomy        Current Outpatient Prescriptions  Medication Sig Dispense Refill  . albuterol (PROVENTIL HFA;VENTOLIN HFA) 108 (90 BASE) MCG/ACT inhaler Inhale 2 puffs into the lungs every 6 (six) hours as needed. Wheezing and shortness of breath      . amLODipine (NORVASC) 2.5 MG tablet Take 2.5 mg by mouth at bedtime.      Marland Kitchen amLODipine (NORVASC) 2.5 MG tablet TAKE ONE TABLET BY MOUTH EVERY DAY  90 tablet  0  . cholecalciferol (VITAMIN D) 1000 UNITS tablet Take 1,000 Units by mouth daily.      . hydrOXYzine (ATARAX/VISTARIL) 25 MG tablet Take 1 tablet (25 mg total) by mouth 3 (three) times daily as needed for itching.  30 tablet  0  . ibuprofen (ADVIL,MOTRIN) 200 MG tablet Take 400 mg by mouth every 6 (six) hours as needed. For pain      . losartan (COZAAR) 100 MG tablet TAKE ONE TABLET BY MOUTH EVERY DAY  30 tablet  0  . metFORMIN (GLUCOPHAGE) 500 MG tablet Take 500 mg by mouth 2 (two) times daily.      . Skin Protectants, Misc. (EUCERIN) cream Apply 1 application topically as needed. All over the body for dry skin       No current facility-administered medications for this visit.      Allergies  Allergen Reactions  . Lisinopril Cough    REVIEW OF SYSTEMS: Skin:  No history of rash.  No history of abnormal moles. Infection:  No history of hepatitis or HIV.  No history of MRSA. Neurologic:  No history of stroke.  No history of seizure.  No history of headaches. Cardiac:  Hypertension x 5 years.  No history of heart disease.  No history of prior cardiac catheterization.  No history of seeing a cardiologist. Pulmonary:  Does not smoke  cigarettes.  No asthma or bronchitis.  No OSA/CPAP.  Endocrine:  Diabetes x 2 years  History of left thyroid lobectomy - 1970's. Gastrointestinal:  See HPI. Urologic:  No history of kidney stones.  No history of bladder infections. Musculoskeletal:  No history of joint or back disease. Hematologic:  Thrombocytopenia. Psycho-social:  The patient is oriented.   The patient has no obvious psychologic or social impairment to understanding our  conversation and plan.  SOCIAL and FAMILY HISTORY: Divorced. Lives by self.  Retired from [employer]" at [employer] Has 3 children.  Has her granddaughter with her.  PHYSICAL EXAM: BP 144/90  Pulse 84  Resp 18  Ht 5\' 8"  (1.727 m)  Wt 225 lb 9.6 oz (102.331 kg)  BMI 34.31 kg/m2  General: Overweight AA F who is alert and generally healthy appearing.  HEENT: Normal. Pupils equal. Neck: Supple. No mass.  No thyroid mass. Lymph Nodes:  No supraclavicular or cervical nodes. Lungs: Clear to auscultation and symmetric breath sounds. Heart:  RRR. No murmur or rub. Breast:  - Right - reduction mammoplasty scar.  1.5 cm nodule at 11 o'clock at areolar edge.  Left - reduction mammoplasty scar.  No obvious mass.  Abdomen: Soft. No mass. No tenderness. No hernia. Normal bowel sounds.  Has striae (like she weight much more at one time).  Some excess skin. Rectal: Not done. Extremities:  Good strength and ROM  in upper and lower extremities. Neurologic:  Grossly intact to motor and sensory function. Psychiatric: Has normal mood and affect.   DATA REVIEWED: Epic notes reviewed and notes from [employer], [employer].  Ovidio Kin, MD,  [facility] [address] [place]  [address]   [place], [place]    [address] Phone:  [phone] FAX:  [phone]

## 2013-03-30 NOTE — Op Note (Addendum)
03/30/2013  2:29 PM  PATIENT:  Victoria [name], 72 y.o., female, MRN: [id number]  PREOP DIAGNOSIS:  Gallstones, recurrent pancreatitis, cirrhosis of the liver.  POSTOP DIAGNOSIS:   Gallstones, symptomatic gall bladder disease, nodular cirrhosis of the liver, 5 mm skin tag below the right eye  PROCEDURE:   Procedure(s): LAPAROSCOPIC CHOLECYSTECTOMY WITH INTRAOPERATIVE CHOLANGIOGRAM, LIVER BIOPSY of right lobe of the liver,  EXCISION OF SKIN TAG below right eye.  SURGEON:   Ovidio Kin, M.D.  ASSISTANTNat Christen, M.D.  ANESTHESIA:   general  Anesthesiologist: Eilene Ghazi, MD CRNA: Thornell Mule, CRNA; Illene Silver, CRNA  General  ASA: 3   EBL:  minimal  ml  BLOOD ADMINISTERED: none  DRAINS: none   LOCAL MEDICATIONS USED:   25 cc 1/4% marcaine.  SPECIMEN:   Gall bladder and liver biopsy.  COUNTS CORRECT:  YES  INDICATIONS FOR PROCEDURE:  Victoria [name] is a 72 y.o. (DOB: 05/14/1941) AA  female whose primary care physician is [name], MD and comes for cholecystectomy.  She has evidence of cirrhosis of the liver on CT scan.  I discussed with her about doing a liver biopsy at the time of surgery.   The indications and risks of the gall bladder surgery were explained to the patient.  The risks include, but are not limited to, infection, bleeding, common bile duct injury and open surgery.  The patient understands that her cirrhosis increases the risks of gall bladder surgery.  SURGERY:  The patient was taken to room #1 at [facility].  The abdomen was prepped with chloroprep.  The patient was given 2 gm Ancef at the beginning of the operation.   A time out was held and the surgical checklist run.   An infraumbilical incision was made into the abdominal cavity.  A 12 mm Hasson trocar was inserted into the abdominal cavity through the infraumbilical incision and secured with a 0 Vicryl suture.  The patient had a ventral hernia just below the umbilicus and I  went through this hernia.  The defect was no more than 1.5 cm, so it was closed with the pursestring at the end of the case.  Three additional trocars were inserted: a 10 mm trocar in the sub-xiphoid location, a 5 mm trocar in the right mid subcostal area, and a 5 mm trocar in the right lateral subcostal area.   The abdomen was explored and the stomach and bowel that could be seen were unremarkable.  The liver had changes of nodular cirrhosis.  I took photos and put these in the chart.  And I did a liver biopsy.  I tried to use a tru cut needle, but the liver is so fibrous, I had trouble getting a reasonable sample of tissue.  Therefore, I I took a 1 cm block of liver at the dome of the gall bladder.  Bleeding was controlled with Bovie electrocautery.   The gall bladder was identified, grasped, and rotated cephalad.  Disssection was carried down to the gall bladder/cystic duct junction and the cystic duct isolated.  A clip was placed on the gall bladder side of the cystic duct.   An intra-operative cholangiogram was shot.   The intra-operative cholangiogram was shot using a cut off Taut catheter placed through a 14 gauge angiocath in the RUQ.  The Taut catheter was inserted in the cut cystic duct and secured with an endoclip.  A cholangiogram was shot with 10 cc of 1/2 strength Omnipaque.  Using fluoroscopy, the cholangiogram showed the flow of contrast into the common bile duct, up the hepatic radicals, and into the duodenum.  The CBD was large.  There was no mass or obstruction.  This was a normal intra-operative cholangiogram.   The Taut catheter was removed.  The cystic duct was tripley endoclipped and the cystic artery was identified and clipped.  The gall bladder was bluntly and sharpley dissected from the gall bladder bed.   After the gall bladder was removed from the liver, the gall bladder bed and Triangle of [place] were inspected.  There was no bleeding or bile leak.  The gall bladder was placed  in a endocatch bag and delivered through the umbilicus.  The abdomen was irrigated with 1000 cc saline.   The trocars were then removed.  I infiltrated 30cc of 1/4% Marcaine into the incisions.  The umbilical port closed with a 0 Vicryl suture and the skin closed with 5-0 vicryl.  The skin was painted with Dermabond.  The patient had a 5 mm skin tag under her right eye.  This was painted with betadine, infiltrated with about 1 cc of xylocaine with epi, and removed.  The base was burned with a Ag Nitrate stick.   The patient's sponge and needle count were correct.  The patient was transported to the RR in good condition.     Ovidio Kin, MD, [facility] Surgery Pager: [phone] Office phone:  [phone]

## 2013-03-30 NOTE — Anesthesia Postprocedure Evaluation (Signed)
  Anesthesia Post-op Note  Patient: Victoria [name]  Procedure(s) Performed: Procedure(s) (LRB): LAPAROSCOPIC CHOLECYSTECTOMY WITH INTRAOPERATIVE CHOLANGIOGRAM (N/A) LIVER BIOPSY EXCISION OF SKIN TAG (Right)  Patient Location: PACU  Anesthesia Type: General  Level of Consciousness: awake and alert   Airway and Oxygen Therapy: Patient Spontanous Breathing  Post-op Pain: mild  Post-op Assessment: Post-op Vital signs reviewed, Patient's Cardiovascular Status Stable, Respiratory Function Stable, Patent Airway and No signs of Nausea or vomiting  Last Vitals:  Filed Vitals:   03/30/13 1500  BP: 167/147  Pulse: 61  Temp: 36.1 C  Resp: 18    Post-op Vital Signs: stable   Complications: No apparent anesthesia complications

## 2013-03-30 NOTE — Interval H&P Note (Signed)
History and Physical Interval Note:  03/30/2013 12:55 PM  Victoria [name]  has presented today for surgery, with the diagnosis of gallstones  The various methods of treatment have been discussed with the patient and family. The patient said that no one is here with her today.  She said an aunt died.  She has a tag below her right eye that she wants to remove.  And we talked about a possible liver biopsy.  After consideration of risks, benefits and other options for treatment, the patient has consented to  Procedure(s): LAPAROSCOPIC CHOLECYSTECTOMY WITH INTRAOPERATIVE CHOLANGIOGRAM (N/A) as a surgical intervention .    The patient's history has been reviewed, patient examined, no change in status, stable for surgery.  I have reviewed the patient's chart and labs.  Questions were answered to the patient's satisfaction.     [name] H

## 2013-03-31 ENCOUNTER — Encounter (HOSPITAL_COMMUNITY): Payer: Self-pay | Admitting: Surgery

## 2013-03-31 ENCOUNTER — Encounter (INDEPENDENT_AMBULATORY_CARE_PROVIDER_SITE_OTHER): Payer: Self-pay

## 2013-03-31 DIAGNOSIS — K861 Other chronic pancreatitis: Secondary | ICD-10-CM | POA: Diagnosis not present

## 2013-03-31 DIAGNOSIS — L909 Atrophic disorder of skin, unspecified: Secondary | ICD-10-CM | POA: Diagnosis not present

## 2013-03-31 DIAGNOSIS — K439 Ventral hernia without obstruction or gangrene: Secondary | ICD-10-CM | POA: Diagnosis not present

## 2013-03-31 DIAGNOSIS — K8020 Calculus of gallbladder without cholecystitis without obstruction: Secondary | ICD-10-CM | POA: Diagnosis not present

## 2013-03-31 DIAGNOSIS — K7460 Unspecified cirrhosis of liver: Secondary | ICD-10-CM | POA: Diagnosis not present

## 2013-03-31 DIAGNOSIS — I10 Essential (primary) hypertension: Secondary | ICD-10-CM | POA: Diagnosis not present

## 2013-03-31 MED ORDER — OXYCODONE HCL 5 MG PO TABS: 5.0000 mg | ORAL_TABLET | ORAL | Status: DC | PRN

## 2013-03-31 NOTE — Progress Notes (Signed)
PATIENT D/C'D HOME- IN W/CHAIR W/ NT. WILL GO BY SECURITY TO GET HER MONEY $32 DOLLARS. SHE HAS HER D/C INS. AND HAS NO QUESTIONS.

## 2013-03-31 NOTE — Care Management Note (Signed)
    Page 1 of 1   03/31/2013     10:47:20 AM   CARE MANAGEMENT NOTE 03/31/2013  Patient:  Victoria [name], Victoria [name]   Account Number:  [id number]  Date Initiated:  03/31/2013  Documentation initiated by:  Lorenda Ishihara  Subjective/Objective Assessment:   72 yo female admitted s/p lap chole. PTA lived at home alone.     Action/Plan:   Home when stable   Anticipated DC Date:  03/31/2013   Anticipated DC Plan:  HOME/SELF CARE      DC Planning Services  CM consult      Choice offered to / List presented to:             Status of service:  Completed, signed off Medicare Important Message given?  NA - LOS <3 / Initial given by admissions (If response is "NO", the following Medicare IM given date fields will be blank) Date Medicare IM given:   Date Additional Medicare IM given:    Discharge Disposition:  HOME/SELF CARE  Per UR Regulation:  Reviewed for med. necessity/level of care/duration of stay  If discussed at Long Length of Stay Meetings, dates discussed:    Comments:

## 2013-03-31 NOTE — Discharge Summary (Signed)
Physician Discharge Summary  Patient ID:  Victoria [name]  MRN: [id number]  DOB/AGE: Jul 07, 1941 72 y.o.  Admit date: 03/30/2013 Discharge date: 03/31/2013  Discharge Diagnoses:  1. Gall stones   History of pancreatitis 2. Hypertension  3. Diabetes Mellitus x 2 years   Hgb A1C - 6.6 - 12/28/2012  4. History of right breast cancer around 2002 - note she has a 1.5 cm nodule (she called it grissle) at the 11 o'clock of the right breast at the edge of the nipple   Unknown surgeon (here). According to Epic, it was Dr. Cindee Lame. Reduction mammoplasty by Dr. Shon Hough - 01/02/2004.    5. Hepatitis C   Hep C clinic - Deborah Chalk, PA. FAX - [phone].  6. Cirrhosis - AST/ALT mildly elevated. Liver appears nodular on the CT.  7. Thrombocytopenia -   Plt - 127,000 - 03/30/2013 8. Pancreatitic cysts/history of pancreatitis   Seen by Dr. Arlyce Dice  9. Morbid obesity - BMI - 34 10.  Skin tag (5 mm) under right eye  Operation: Procedure(s):  LAPAROSCOPIC CHOLECYSTECTOMY WITH INTRAOPERATIVE CHOLANGIOGRAM, LIVER BIOPSY,  EXCISION OF SKIN TAG on 03/30/2013  Discharged Condition: good  Hospital Course: JOEANNA HOWDYSHELL is an 72 y.o. female whose primary care physician is [name], MD and who was admitted 03/30/2013 with a chief complaint of gall stones, recurrent pancreatitis, and cirrhosis of the liver.   She was brought to the operating room on 03/30/2013 and underwent  LAPAROSCOPIC CHOLECYSTECTOMY WITH INTRAOPERATIVE CHOLANGIOGRAM, LIVER BIOPSY,  EXCISION OF SKIN TAG .   She has done well post op.  She has tolerated a diet and is ready to go home.    The discharge instructions were reviewed with the patient.  Consults: None  Significant Diagnostic Studies: Results for orders placed during the hospital encounter of 03/30/13  SURGICAL PCR SCREEN      Result Value Range   MRSA, PCR INVALID RESULTS, SPECIMEN SENT FOR CULTURE (*) NEGATIVE   Staphylococcus aureus INVALID RESULTS, SPECIMEN SENT FOR  CULTURE (*) NEGATIVE  CBC WITH DIFFERENTIAL      Result Value Range   WBC 5.3  4.0 - 10.5 K/uL   RBC 4.52  3.87 - 5.11 MIL/uL   Hemoglobin 13.0  12.0 - 15.0 g/dL   HCT 82.9  56.2 - 13.0 %   MCV 85.4  78.0 - 100.0 fL   MCH 28.8  26.0 - 34.0 pg   MCHC 33.7  30.0 - 36.0 g/dL   RDW 86.5  78.4 - 69.6 %   Platelets 127 (*) 150 - 400 K/uL   Neutrophils Relative 43  43 - 77 %   Neutro Abs 2.3  1.7 - 7.7 K/uL   Lymphocytes Relative 50 (*) 12 - 46 %   Lymphs Abs 2.7  0.7 - 4.0 K/uL   Monocytes Relative 6  3 - 12 %   Monocytes Absolute 0.3  0.1 - 1.0 K/uL   Eosinophils Relative 1  0 - 5 %   Eosinophils Absolute 0.1  0.0 - 0.7 K/uL   Basophils Relative 0  0 - 1 %   Basophils Absolute 0.0  0.0 - 0.1 K/uL  COMPREHENSIVE METABOLIC PANEL      Result Value Range   Sodium 138  135 - 145 mEq/L   Potassium 4.5  3.5 - 5.1 mEq/L   Chloride 102  96 - 112 mEq/L   CO2 29  19 - 32 mEq/L   Glucose, Bld 133 (*) 70 -  99 mg/dL   BUN 14  6 - 23 mg/dL   Creatinine, Ser 1.61  0.50 - 1.10 mg/dL   Calcium 9.2  8.4 - 09.6 mg/dL   Total Protein 7.0  6.0 - 8.3 g/dL   Albumin 3.1 (*) 3.5 - 5.2 g/dL   AST 49 (*) 0 - 37 U/L   ALT 45 (*) 0 - 35 U/L   Alkaline Phosphatase 75  39 - 117 U/L   Total Bilirubin 0.6  0.3 - 1.2 mg/dL   GFR calc non Af Amer 72 (*) >90 mL/min   GFR calc Af Amer 84 (*) >90 mL/min  GLUCOSE, CAPILLARY      Result Value Range   Glucose-Capillary 118 (*) 70 - 99 mg/dL  GLUCOSE, CAPILLARY      Result Value Range   Glucose-Capillary 108 (*) 70 - 99 mg/dL   Comment 1 Documented in Chart     Comment 2 Notify RN      Dg Chest 2 View  03/30/2013  *RADIOLOGY REPORT*  Clinical Data: Preoperative evaluation prior to laparoscopic cholecystectomy.  CHEST - 2 VIEW  Comparison: 11/22/2008.  Findings: Lung volumes are normal.  No consolidative airspace disease.  No pleural effusions.  No pneumothorax.  No pulmonary nodule or mass noted.  Pulmonary vasculature and the cardiomediastinal silhouette are  within normal limits. Atherosclerosis in the thoracic aorta.  Surgical clips project over the lateral aspect of the right breast, likely from prior lumpectomy.  IMPRESSION: 1. No radiographic evidence of acute cardiopulmonary disease. 2.  Atherosclerosis.   Original Report Authenticated By: Trudie Reed, M.D.    Dg Cholangiogram Operative  03/30/2013  *RADIOLOGY REPORT*  Clinical Data:   Right upper abdominal pain  INTRAOPERATIVE CHOLANGIOGRAM  Technique:  Cholangiographic images from the C-arm fluoroscopic device were submitted for interpretation post-operatively.  Please see the procedural report for the amount of contrast and the fluoroscopy time utilized.  Comparison:  MRCP 12/15/2012  Findings:  No persistent filling defects in the common duct. Intrahepatic ducts are incompletely visualized, appearing decompressed centrally. Contrast passes into the duodenum. There is some contrast reflux into the pancreatic duct in the head.  IMPRESSION  Negative for retained common duct stone.   Original Report Authenticated By: D. Andria Rhein, MD    US Breast Right  03/17/2013  *RADIOLOGY REPORT*  Clinical Data:  The patient feels a lump in the right anterior upper outer quadrant which she states has been present for years. She has a personal history of right breast cancer in 2005.  She underwent bilateral reduction mammoplasty in 2005.  DIGITAL DIAGNOSTIC BILATERAL MAMMOGRAM WITH CAD AND RIGHT BREAST ULTRASOUND:  Comparison:  07/04/2006  Findings:  ACR Breast Density Category 1: The breast tissue is almost entirely fatty.  Reduction mammoplasty changes are present.  There are calcified oil cysts and coarse benign dystrophic calcifications bilaterally consistent with fat necrosis.  There is no suspicious dominant mass, nonsurgical architectural distortion or calcification to suggest malignancy.  Mammographic images were processed with CAD.  On physical exam, I palpate a 1 cm nodule at 10 o'clock 3 cm from the right  nipple.  The patient had originally placed the BB at the areolar margin but I confirmed with the patient that the nodule 3 cm from the nipple was indeed the area of concern.  Ultrasound is performed, showing an oil cyst at 10 o'clock 3 cm from the right nipple which corresponds to the palpable finding.  IMPRESSION: No mammographic or sonographic evidence of malignancy.  RECOMMENDATION: Yearly screening mammography is suggested.  I have discussed the findings and recommendations with the patient. Results were also provided in writing at the conclusion of the visit.  BI-RADS CATEGORY 2:  Benign finding(s).   Original Report Authenticated By: Cain Saupe, M.D.    Mm Digital Diagnostic Bilat  03/17/2013  *RADIOLOGY REPORT*  Clinical Data:  The patient feels a lump in the right anterior upper outer quadrant which she states has been present for years. She has a personal history of right breast cancer in 2005.  She underwent bilateral reduction mammoplasty in 2005.  DIGITAL DIAGNOSTIC BILATERAL MAMMOGRAM WITH CAD AND RIGHT BREAST ULTRASOUND:  Comparison:  07/04/2006  Findings:  ACR Breast Density Category 1: The breast tissue is almost entirely fatty.  Reduction mammoplasty changes are present.  There are calcified oil cysts and coarse benign dystrophic calcifications bilaterally consistent with fat necrosis.  There is no suspicious dominant mass, nonsurgical architectural distortion or calcification to suggest malignancy.  Mammographic images were processed with CAD.  On physical exam, I palpate a 1 cm nodule at 10 o'clock 3 cm from the right nipple.  The patient had originally placed the BB at the areolar margin but I confirmed with the patient that the nodule 3 cm from the nipple was indeed the area of concern.  Ultrasound is performed, showing an oil cyst at 10 o'clock 3 cm from the right nipple which corresponds to the palpable finding.  IMPRESSION: No mammographic or sonographic evidence of malignancy.   RECOMMENDATION: Yearly screening mammography is suggested.  I have discussed the findings and recommendations with the patient. Results were also provided in writing at the conclusion of the visit.  BI-RADS CATEGORY 2:  Benign finding(s).   Original Report Authenticated By: Cain Saupe, M.D.     Discharge Exam:  Filed Vitals:   03/31/13 0555  BP: 124/69  Pulse: 58  Temp: 98.2 F (36.8 C)  Resp: 18    General: WN obese AA F who is alert and generally healthy appearing.  Scar below the right eye okay. Lungs: Clear to auscultation and symmetric breath sounds. Heart:  RRR. No murmur or rub. Abdomen: Soft. No mass. Normal bowel sounds. Incisions look good.    Discharge Medications:     Medication List    ASK your doctor about these medications       albuterol 108 (90 BASE) MCG/ACT inhaler  Commonly known as:  PROVENTIL HFA;VENTOLIN HFA  Inhale 2 puffs into the lungs every 6 (six) hours as needed. Wheezing and shortness of breath     amLODipine 2.5 MG tablet  Commonly known as:  NORVASC  Take 2.5 mg by mouth at bedtime.     cholecalciferol 1000 UNITS tablet  Commonly known as:  VITAMIN D  Take 1,000 Units by mouth daily.     eucerin cream  Apply 1 application topically as needed. All over the body for dry skin     ibuprofen 200 MG tablet  Commonly known as:  ADVIL,MOTRIN  Take 400 mg by mouth every 6 (six) hours as needed. For pain     losartan 100 MG tablet  Commonly known as:  COZAAR  Take 100 mg by mouth every morning.     metFORMIN 500 MG tablet  Commonly known as:  GLUCOPHAGE  Take 1 tablet (500 mg total) by mouth 2 (two) times daily.        Disposition: 01-Home or Self Care       Future Appointments Provider Department  Dept Phone   04/13/2013 9:45 AM [name], MD Sharlet Salina, MD [phone]   04/16/2013 2:30 PM Kandis Cocking, MD [facility], [place] [phone]      Activity:  Driving - May drive in 2 days, if doing  well   Lifting - No lifting greater than 15 pounds for 1 week, then no limit  Wound Care:   May shower starting tomorrow  Diet:  As tolerated.  Be carefull with fatty foods for several weeks.  Follow up appointment:  Call Dr. Allene Pyo office [facility]) at [phone] for an appointment in 2 to 3 weeks.  Medications and dosages:  Resume your home medications.  You have a prescription for:  Oxycodone   Signed: Ovidio Kin, M.D., FACS  03/31/2013, 8:22 AM

## 2013-04-01 LAB — MRSA CULTURE

## 2013-04-05 ENCOUNTER — Encounter: Payer: Self-pay | Admitting: *Deleted

## 2013-04-12 ENCOUNTER — Encounter: Payer: Self-pay | Admitting: *Deleted

## 2013-04-13 ENCOUNTER — Ambulatory Visit: Payer: Self-pay | Admitting: Internal Medicine

## 2013-04-16 ENCOUNTER — Encounter (INDEPENDENT_AMBULATORY_CARE_PROVIDER_SITE_OTHER): Payer: Medicare Other | Admitting: Surgery

## 2013-04-19 ENCOUNTER — Encounter: Payer: Self-pay | Admitting: Gastroenterology

## 2013-04-19 ENCOUNTER — Ambulatory Visit (INDEPENDENT_AMBULATORY_CARE_PROVIDER_SITE_OTHER): Payer: Medicare Other | Admitting: Gastroenterology

## 2013-04-19 VITALS — BP 118/70 | HR 80 | Ht 66.5 in | Wt 224.2 lb

## 2013-04-19 DIAGNOSIS — K859 Acute pancreatitis without necrosis or infection, unspecified: Secondary | ICD-10-CM

## 2013-04-19 DIAGNOSIS — B182 Chronic viral hepatitis C: Secondary | ICD-10-CM

## 2013-04-19 NOTE — Patient Instructions (Addendum)
Follow up as needed

## 2013-04-19 NOTE — Progress Notes (Signed)
History of Present Illness:  Patient has returned following laparoscopic cholecystectomy. Small stones and chronic cholecystitis were seen. Liver biopsy demonstrated cirrhosis. The patient is recovering uneventfully. She has very nonspecific intermittent lower abdominal discomfort. She is awaiting therapy for hepatitis C.    Review of Systems: Pertinent positive and negative review of systems were noted in the above HPI section. All other review of systems were otherwise negative.    Current Medications, Allergies, Past Medical History, Past Surgical History, Family History and Social History were reviewed in [employer] electronic medical record  Vital signs were reviewed in today's medical record. Physical Exam: General: Well developed , well nourished, no acute distress

## 2013-04-19 NOTE — Assessment & Plan Note (Signed)
Clinically resolved.  

## 2013-04-19 NOTE — Assessment & Plan Note (Signed)
Patient has hepatitis C and cirrhosis. Therapy will begin later this year when a medicine is available that she has insurance coverage for.

## 2013-04-21 ENCOUNTER — Ambulatory Visit (INDEPENDENT_AMBULATORY_CARE_PROVIDER_SITE_OTHER): Payer: Medicare Other | Admitting: Surgery

## 2013-04-21 ENCOUNTER — Encounter (INDEPENDENT_AMBULATORY_CARE_PROVIDER_SITE_OTHER): Payer: Self-pay | Admitting: Surgery

## 2013-04-21 VITALS — BP 138/64 | HR 76 | Temp 98.6°F | Resp 16 | Ht 68.0 in | Wt 224.6 lb

## 2013-04-21 DIAGNOSIS — B182 Chronic viral hepatitis C: Secondary | ICD-10-CM

## 2013-04-21 DIAGNOSIS — K8010 Calculus of gallbladder with chronic cholecystitis without obstruction: Secondary | ICD-10-CM

## 2013-04-21 NOTE — Progress Notes (Signed)
Re:   Victoria [name] DOB:   March 12, 1941 MRN:   [id number]  ASSESSMENT AND PLAN: 1.  Gall stones - history of pancreatitis.  S/P lap chole - 03/30/2013  Has done well  Return PRN  2.  Hypertension 3.  Diabetes Mellitus x 2 years  Hgb A1C - 6.6 - 12/28/2012 4.  History of right breast cancer around 2002 - note she has a 1.5 cm nodule (she called it grissle) at the 11 o'clock of the right breast at the edge of the nipple  Unknown surgeon (here).  According to Epic, it was Victoria [name].   Reduction mammoplasty by Dr. Shon [name] - 01/02/2004.  Has not had a mammogram in a while.  She has agreed to let us set her up for a mammogram. 5.  Hepatitis C  Seen at the Hep C clinic - Victoria Chalk, PA.  FAX - [phone].  .6.  Cirrhosis - AST/ALT mildly elevated.  Liver appears nodular on the CT.    Liver biopsy shows cirrhosis  Copy of path to patient. 7.  Thrombocytopenia -   Plt - 106,000 - 12/18/2012 8.  Pancreatitic cysts/history of pancreatitis  Seen by Victoria [name] 9.  Morbid obesity - BMI - 34  Chief Complaint  Patient presents with  . Follow-up    1st po GB   REFERRING PHYSICIAN:  Dr. Darrel [name]  HISTORY OF PRESENT ILLNESS: Victoria [name] is a 72 y.o. (DOB: 1941-12-18)  AA  female whose primary care physician is Victoria [name] and comes to me today for follow up of gall bladder surgery. She is doing well.  No complaints. Her granddaughter, Victoria [name], is with her.  History of gall bladder disease: The patient comes with a complex history. In December 2013, she presented with epigastric discomfort and pain. She had gastritis about 10 years ago and thought this was the same problem. However evaluation revealed pancreatitis and hepatitis C. A CT scan on 12/14/2012 showed cholelithiasis, a cirrhotic liver, suggestions of acute pancreatitis and an indeterminate mass in the head of the pancreas.   She is being seen by Victoria [name] for her pancreatitis and hepatitis. Victoria [name] did  an endoscopic ultrasound 01/28/2013 in which he saw cysts in the pancreas and evidence of chronic pancreatitis.  She continues to have vague epigastric pain, though she is not having it right now.  But she is eating, though nauseated at times.  She thinks that she has lost about 10 pounds.  Her bowels are functioning fairly normally.  She also complains of weakness and just not feeling well.   Past Medical History  Diagnosis Date  . Hypertension   . Diabetes mellitus   . Abnormal finding on Pap smear   . Obesity   . Cancer     breast  . Hepatitis C 11-2012    Active  . Pancreatitis, acute 11-2012  . Cholelithiasis 11-2012  . Cirrhosis 11-2012  . Thrombocytopenia 11-2012  . Gallstones      Current Outpatient Prescriptions  Medication Sig Dispense Refill  . albuterol (PROVENTIL HFA;VENTOLIN HFA) 108 (90 BASE) MCG/ACT inhaler Inhale 2 puffs into the lungs every 6 (six) hours as needed. Wheezing and shortness of breath      . amLODipine (NORVASC) 2.5 MG tablet Take 2.5 mg by mouth at bedtime.      . cholecalciferol (VITAMIN D) 1000 UNITS tablet Take 1,000 Units by mouth daily.      Marland Kitchen ibuprofen (ADVIL,MOTRIN) 200 MG  tablet Take 400 mg by mouth every 6 (six) hours as needed. For pain      . losartan (COZAAR) 100 MG tablet Take 100 mg by mouth every morning.      . metFORMIN (GLUCOPHAGE) 500 MG tablet Take 1 tablet (500 mg total) by mouth 2 (two) times daily.  60 tablet  5  . oxyCODONE (OXY IR/ROXICODONE) 5 MG immediate release tablet Take 1 tablet (5 mg total) by mouth every 3 (three) hours as needed.  30 tablet  0  . Skin Protectants, Misc. (EUCERIN) cream Apply 1 application topically as needed. All over the body for dry skin       No current facility-administered medications for this visit.      Allergies  Allergen Reactions  . Lisinopril Cough    REVIEW OF SYSTEMS: Cardiac:  Hypertension x 5 years.  No history of heart disease.  No history of prior cardiac catheterization.  No  history of seeing a cardiologist. Pulmonary:  Does not smoke cigarettes.  No asthma or bronchitis.  No OSA/CPAP. Endocrine:  Diabetes x 2 years  History of left thyroid lobectomy - 1970's. Gastrointestinal:  See HPI. Hematologic:  Thrombocytopenia.  SOCIAL and FAMILY HISTORY: Divorced. Lives by self.  Retired from [employer]" at [employer] Has 3 children.   Has her granddaughter, Victoria [name],  with her.  PHYSICAL EXAM: BP 138/64  Pulse 76  Temp(Src) 98.6 F (37 C) (Temporal)  Resp 16  Ht 5\' 8"  (1.727 m)  Wt 224 lb 9.6 oz (101.878 kg)  BMI 34.16 kg/m2  General: Overweight AA F who is alert and generally healthy appearing.  Abdomen: Soft. No mass. No tenderness. No hernia.  Incisions look good.  DATA REVIEWED: Path to patient.  Victoria Kin, [name],  [facility] [address] [place]  [address]   [place], [place]    [address] Phone:  [phone] FAX:  [phone]

## 2013-04-23 ENCOUNTER — Encounter (INDEPENDENT_AMBULATORY_CARE_PROVIDER_SITE_OTHER): Payer: Self-pay

## 2013-05-03 ENCOUNTER — Telehealth (INDEPENDENT_AMBULATORY_CARE_PROVIDER_SITE_OTHER): Payer: Self-pay | Admitting: *Deleted

## 2013-05-03 NOTE — Telephone Encounter (Signed)
Patient called to ask for refill of pain medication.  Patient is willing to try Norco 5/325mg  1 tablet every 6 hours as needed for pain #30 no refills. [facility] [phone]

## 2013-05-04 ENCOUNTER — Other Ambulatory Visit: Payer: Medicare Other | Admitting: Internal Medicine

## 2013-05-04 DIAGNOSIS — E119 Type 2 diabetes mellitus without complications: Secondary | ICD-10-CM

## 2013-05-04 DIAGNOSIS — Z13220 Encounter for screening for lipoid disorders: Secondary | ICD-10-CM | POA: Diagnosis not present

## 2013-05-04 DIAGNOSIS — B1920 Unspecified viral hepatitis C without hepatic coma: Secondary | ICD-10-CM

## 2013-05-04 LAB — LIPID PANEL
Cholesterol: 111 mg/dL (ref 0–200)
HDL: 30 mg/dL — ABNORMAL LOW (ref >39)
LDL Cholesterol: 53 mg/dL (ref 0–99)
Total CHOL/HDL Ratio: 3.7 Ratio
Triglycerides: 138 mg/dL (ref <150)
VLDL: 28 mg/dL (ref 0–40)

## 2013-05-04 LAB — COMPREHENSIVE METABOLIC PANEL
ALT: 33 U/L (ref 0–35)
AST: 37 U/L (ref 0–37)
Albumin: 3.6 g/dL (ref 3.5–5.2)
Alkaline Phosphatase: 65 U/L (ref 39–117)
BUN: 11 mg/dL (ref 6–23)
CO2: 25 mEq/L (ref 19–32)
Calcium: 8.8 mg/dL (ref 8.4–10.5)
Chloride: 105 mEq/L (ref 96–112)
Creat: 0.82 mg/dL (ref 0.50–1.10)
Glucose, Bld: 125 mg/dL — ABNORMAL HIGH (ref 70–99)
Potassium: 4 mEq/L (ref 3.5–5.3)
Sodium: 142 mEq/L (ref 135–145)
Total Bilirubin: 0.7 mg/dL (ref 0.3–1.2)
Total Protein: 7.2 g/dL (ref 6.0–8.3)

## 2013-05-04 LAB — HEMOGLOBIN A1C
Hgb A1c MFr Bld: 6 % — ABNORMAL HIGH (ref <5.7)
Mean Plasma Glucose: 126 mg/dL — ABNORMAL HIGH (ref <117)

## 2013-05-07 ENCOUNTER — Encounter: Payer: Self-pay | Admitting: Internal Medicine

## 2013-05-07 ENCOUNTER — Ambulatory Visit (INDEPENDENT_AMBULATORY_CARE_PROVIDER_SITE_OTHER): Payer: Medicare Other | Admitting: Internal Medicine

## 2013-05-07 VITALS — BP 108/74 | HR 72 | Temp 97.9°F | Wt 220.0 lb

## 2013-05-07 DIAGNOSIS — Z9089 Acquired absence of other organs: Secondary | ICD-10-CM

## 2013-05-07 DIAGNOSIS — Z853 Personal history of malignant neoplasm of breast: Secondary | ICD-10-CM

## 2013-05-07 DIAGNOSIS — I10 Essential (primary) hypertension: Secondary | ICD-10-CM

## 2013-05-07 DIAGNOSIS — Z9049 Acquired absence of other specified parts of digestive tract: Secondary | ICD-10-CM

## 2013-05-07 DIAGNOSIS — Z8719 Personal history of other diseases of the digestive system: Secondary | ICD-10-CM | POA: Diagnosis not present

## 2013-05-07 DIAGNOSIS — B182 Chronic viral hepatitis C: Secondary | ICD-10-CM | POA: Diagnosis not present

## 2013-05-07 DIAGNOSIS — E119 Type 2 diabetes mellitus without complications: Secondary | ICD-10-CM

## 2013-05-07 NOTE — Progress Notes (Signed)
  Subjective:    Patient ID: Victoria [name], female    DOB: 06-22-1941, 72 y.o.   MRN: [id number]  HPI Patient is status post recent cholecystectomy for gallstones and chronic cholecystitis with history of pancreatitis related to gallstones. Had liver biopsy at the same time showing cirrhosis. History of hepatitis C. She did see Hepatologist who apparently did not recommend treatment at this point in time because of experience according to patient. Says she was told to have liver functions followed and she is to return to Hepatologist who now apparently has office in [facility] in 6 months. She has a history of type 2 diabetes mellitus. Says she's had some fatigue after cholecystectomy. Recent lab work shows liver functions to be within normal limits.  Has a history of hypertension. Hemoglobin A1c is excellent at 6%. History of breast cancer. Has lost 14 pounds since January.    Review of Systems     Objective:   Physical Exam Neck is supple without JVD thyromegaly or carotid bruits. Chest clear to auscultation. Cardiac exam regular rate and rhythm normal S1 and S2. Extremities without edema. See diabetic foot exam. Skin is warm and dry.        Assessment & Plan:  Controlled type 2 diabetes mellitus-hemoglobin A1c stable  Chronic hepatitis C currently not on therapy. Liver functions are normal.  Cirrhosis of the liver-status post liver biopsy  Status post recent cholecystectomy for gallstones and chronic cholecystitis  History of pancreatitis thought to be rated to gallstones  Hypertension-stable on current regimen  History of breast cancer  Obesity  Plan: Continue same medications and return in 6 months. Encourage patient to followup with hepatologist in 6 months. 25 minutes spent with patient.

## 2013-05-07 NOTE — Patient Instructions (Addendum)
Continue current medications and return in 6 months. Reminded about annual diabetic eye exam.

## 2013-05-15 ENCOUNTER — Emergency Department (HOSPITAL_COMMUNITY)
Admission: EM | Admit: 2013-05-15 | Discharge: 2013-05-15 | Discharge status: Against Medical Advice | Payer: Medicare Other | Attending: Emergency Medicine | Admitting: Emergency Medicine

## 2013-05-15 DIAGNOSIS — E119 Type 2 diabetes mellitus without complications: Secondary | ICD-10-CM | POA: Insufficient documentation

## 2013-05-15 DIAGNOSIS — I10 Essential (primary) hypertension: Secondary | ICD-10-CM | POA: Insufficient documentation

## 2013-05-15 DIAGNOSIS — R55 Syncope and collapse: Secondary | ICD-10-CM | POA: Insufficient documentation

## 2013-05-15 DIAGNOSIS — E669 Obesity, unspecified: Secondary | ICD-10-CM | POA: Insufficient documentation

## 2013-05-15 LAB — GLUCOSE, CAPILLARY: Glucose-Capillary: 149 mg/dL — ABNORMAL HIGH (ref 70–99)

## 2013-05-28 ENCOUNTER — Encounter: Payer: Self-pay | Admitting: Internal Medicine

## 2013-05-28 ENCOUNTER — Ambulatory Visit (INDEPENDENT_AMBULATORY_CARE_PROVIDER_SITE_OTHER): Payer: Medicare Other | Admitting: Internal Medicine

## 2013-05-28 VITALS — BP 146/90 | HR 84 | Temp 97.6°F | Wt 217.0 lb

## 2013-05-28 DIAGNOSIS — E119 Type 2 diabetes mellitus without complications: Secondary | ICD-10-CM

## 2013-05-28 DIAGNOSIS — I10 Essential (primary) hypertension: Secondary | ICD-10-CM | POA: Diagnosis not present

## 2013-05-28 DIAGNOSIS — J019 Acute sinusitis, unspecified: Secondary | ICD-10-CM

## 2013-05-28 DIAGNOSIS — R3915 Urgency of urination: Secondary | ICD-10-CM | POA: Diagnosis not present

## 2013-05-28 DIAGNOSIS — J309 Allergic rhinitis, unspecified: Secondary | ICD-10-CM

## 2013-05-28 DIAGNOSIS — N390 Urinary tract infection, site not specified: Secondary | ICD-10-CM

## 2013-05-28 LAB — POCT URINALYSIS DIPSTICK
Blood, UA: NEGATIVE
Glucose, UA: NEGATIVE
Ketones, UA: NEGATIVE
Nitrite, UA: NEGATIVE
Spec Grav, UA: >=1.03
Urobilinogen, UA: NEGATIVE
pH, UA: 6

## 2013-05-28 NOTE — Progress Notes (Signed)
  Subjective:    Patient ID: Victoria [name], female    DOB: 02-14-1941, 72 y.o.   MRN: [id number]  HPI 72 year old Black female diabetic status post cholecystectomy Spring 2014 now complaining of urinary urgency and suprapubic pressure. No frank dysuria. Also having some left lower back pain. No fever or shaking chills. Has had symptoms for several days. Notices it mainly at night. Patient also complaining of sinus symptoms. Has been taking over-the-counter preparations containing phenylephrine and Sudafed. Reminded her these with elevated her blood pressure. Her blood pressure is elevated today and she says she has not taken her antihypertensive medication in 24 hours.   Review of Systems     Objective:   Physical Exam no CVA tenderness. Urinalysis is slightly abnormal with trace LE.  Boggy nasal mucosa bilaterally. Pharynx is clear. TMs are clear. Neck is supple. Chest is clear without rales or wheezing. Skin is warm and dry.        Assessment & Plan:  Probable urinary tract infection  Adult onset diabetes mellitus treated with medication  Allergic rhinitis  Sinusitis  Plan: Levaquin 500 milligrams daily for 7 days will treat both UTI and sinusitis. Flonase nasal spray 2 sprays in each nostril daily. Avoid products with phenylephrine and Sudafed. Take Claritin or Zyrtec daily for allergic rhinitis symptoms. Urine culture is pending.

## 2013-05-28 NOTE — Patient Instructions (Addendum)
Take Levaquin 500 milligrams daily for 7 days to treat both urinary infection and sinusitis. Use Flonase nasal spray 2 sprays in each nostril daily. Take antihypertensive medications in the morning every day. Avoid products with pseudoephedrine phenylephrine.

## 2013-05-28 NOTE — Addendum Note (Signed)
Addended by: Judy Pimple on: 05/28/2013 02:39 PM   Modules accepted: Orders

## 2013-05-30 LAB — URINE CULTURE: Colony Count: 9000

## 2013-06-03 ENCOUNTER — Telehealth (INDEPENDENT_AMBULATORY_CARE_PROVIDER_SITE_OTHER): Payer: Self-pay

## 2013-06-03 NOTE — Telephone Encounter (Signed)
Norco 5-325mg  needs to be filled by PCP Victoria [name] faxed [phone]

## 2013-06-07 ENCOUNTER — Encounter: Payer: Self-pay | Admitting: Internal Medicine

## 2013-06-07 ENCOUNTER — Ambulatory Visit (INDEPENDENT_AMBULATORY_CARE_PROVIDER_SITE_OTHER): Payer: Medicare Other | Admitting: Internal Medicine

## 2013-06-07 ENCOUNTER — Ambulatory Visit
Admission: RE | Admit: 2013-06-07 | Discharge: 2013-06-07 | Disposition: A | Discharge status: Home / Self Care | Payer: Medicare Other | Source: Ambulatory Visit | Attending: Internal Medicine | Admitting: Internal Medicine

## 2013-06-07 VITALS — BP 118/80 | HR 84 | Temp 97.9°F | Wt 214.0 lb

## 2013-06-07 DIAGNOSIS — E119 Type 2 diabetes mellitus without complications: Secondary | ICD-10-CM

## 2013-06-07 DIAGNOSIS — N390 Urinary tract infection, site not specified: Secondary | ICD-10-CM | POA: Diagnosis not present

## 2013-06-07 DIAGNOSIS — K862 Cyst of pancreas: Secondary | ICD-10-CM | POA: Diagnosis not present

## 2013-06-07 DIAGNOSIS — K863 Pseudocyst of pancreas: Secondary | ICD-10-CM | POA: Diagnosis not present

## 2013-06-07 DIAGNOSIS — R109 Unspecified abdominal pain: Secondary | ICD-10-CM

## 2013-06-07 DIAGNOSIS — K7460 Unspecified cirrhosis of liver: Secondary | ICD-10-CM | POA: Diagnosis not present

## 2013-06-07 DIAGNOSIS — K5730 Diverticulosis of large intestine without perforation or abscess without bleeding: Secondary | ICD-10-CM | POA: Diagnosis not present

## 2013-06-07 LAB — CBC WITH DIFFERENTIAL/PLATELET
Basophils Absolute: 0 10*3/uL (ref 0.0–0.1)
Basophils Relative: 0 % (ref 0–1)
Eosinophils Absolute: 0 10*3/uL (ref 0.0–0.7)
Eosinophils Relative: 0 % (ref 0–5)
HCT: 38.8 % (ref 36.0–46.0)
Hemoglobin: 13.8 g/dL (ref 12.0–15.0)
Lymphocytes Relative: 48 % — ABNORMAL HIGH (ref 12–46)
Lymphs Abs: 3.8 10*3/uL (ref 0.7–4.0)
MCH: 29.3 pg (ref 26.0–34.0)
MCHC: 35.6 g/dL (ref 30.0–36.0)
MCV: 82.4 fL (ref 78.0–100.0)
Monocytes Absolute: 0.4 10*3/uL (ref 0.1–1.0)
Monocytes Relative: 5 % (ref 3–12)
Neutro Abs: 3.8 10*3/uL (ref 1.7–7.7)
Neutrophils Relative %: 47 % (ref 43–77)
Platelets: 133 10*3/uL — ABNORMAL LOW (ref 150–400)
RBC: 4.71 MIL/uL (ref 3.87–5.11)
RDW: 13.8 % (ref 11.5–15.5)
WBC: 8.1 10*3/uL (ref 4.0–10.5)

## 2013-06-07 LAB — POCT URINALYSIS DIPSTICK
Blood, UA: NEGATIVE
Glucose, UA: NEGATIVE
Ketones, UA: NEGATIVE
Nitrite, UA: NEGATIVE
Spec Grav, UA: 1.02
Urobilinogen, UA: 1
pH, UA: 7

## 2013-06-07 LAB — COMPREHENSIVE METABOLIC PANEL
ALT: 33 U/L (ref 0–35)
AST: 36 U/L (ref 0–37)
Albumin: 3.5 g/dL (ref 3.5–5.2)
Alkaline Phosphatase: 80 U/L (ref 39–117)
BUN: 14 mg/dL (ref 6–23)
CO2: 27 mEq/L (ref 19–32)
Calcium: 9.3 mg/dL (ref 8.4–10.5)
Chloride: 102 mEq/L (ref 96–112)
Creat: 0.77 mg/dL (ref 0.50–1.10)
Glucose, Bld: 100 mg/dL — ABNORMAL HIGH (ref 70–99)
Potassium: 3.7 mEq/L (ref 3.5–5.3)
Sodium: 139 mEq/L (ref 135–145)
Total Bilirubin: 0.6 mg/dL (ref 0.3–1.2)
Total Protein: 7.4 g/dL (ref 6.0–8.3)

## 2013-06-07 MED ORDER — IOHEXOL 300 MG/ML  SOLN
30.0000 mL | Freq: Once | INTRAMUSCULAR | Status: AC | PRN
  Administered 2013-06-07: 30 mL via ORAL

## 2013-06-07 MED ORDER — IOHEXOL 300 MG/ML  SOLN
125.0000 mL | Freq: Once | INTRAMUSCULAR | Status: AC | PRN
  Administered 2013-06-07: 125 mL via INTRAVENOUS

## 2013-06-08 LAB — URINE CULTURE

## 2013-07-03 ENCOUNTER — Other Ambulatory Visit: Payer: Self-pay | Admitting: Internal Medicine

## 2013-07-28 ENCOUNTER — Other Ambulatory Visit: Payer: Self-pay

## 2013-10-28 ENCOUNTER — Other Ambulatory Visit: Payer: Self-pay

## 2013-11-10 DIAGNOSIS — B182 Chronic viral hepatitis C: Secondary | ICD-10-CM | POA: Diagnosis not present

## 2013-11-11 ENCOUNTER — Other Ambulatory Visit: Payer: Self-pay | Admitting: Internal Medicine

## 2013-11-11 DIAGNOSIS — C220 Liver cell carcinoma: Secondary | ICD-10-CM

## 2013-11-21 NOTE — Progress Notes (Signed)
   Subjective:    Patient ID: Victoria [name], female    DOB: May 27, 1941, 72 y.o.   MRN: [id number]  HPI 72 year old black female with diabetes was seen June 6 complaining of urinary frequency. Urine culture grew 9000 colonies per millimeter- insignificant growth. She had trace LE on urine dipstick at that time. She was treated with Levaquin 500 mg daily for 7 days. She was also complaining of some left back pain and some sinusitis symptoms on June 6 as well. Now complaining of severe bilateral back pain radiating into her groin area. Urinalysis today has 2+ LE. Symptoms are confusing. Complaining of urinary frequency and dysuria once again. Think she has a UTI. Complaining of some lower abdominal pain. No vomiting. Says pain has been rather severe for several days with associated nausea. She has a history of hepatitis C and cirrhosis. Complaining of alternating diarrhea and constipation.    Review of Systems     Objective:   Physical Exam  Abdomen slightly distended. Tenderness in lower abdomen. CBC with differential shows no leukocytosis. C. met shows no elevated liver functions. Bowel sounds are decreased. Chest clear to auscultation. Cardiac exam regular rate and rhythm.         Assessment & Plan:   Recurrent abdominal pain and urinary symptoms-? Kidney stone:? Diverticulitis  History of cirrhosis and hepatitis C  Plan: Patient had CT of the abdomen and pelvis for further evaluation.  Addendum: CT does not show acute diverticulitis or kidney stone. Cirrhosis as noted. She has a 14 mm pancreatic cyst. Patient will be treated for possible UTI and possible diverticulitis with Cipro 500 mg twice daily for 10 days. If symptoms persist, she will need to be referred back to gastroenterologist.  Addendum: Urine culture grew 20,000 col/mL multiple species  Over one hour spent with patient including examination, taking history, discussing CT results

## 2013-11-21 NOTE — Patient Instructions (Signed)
Take Cipro 500 mg twice daily for 10 days. Call if symptoms persist

## 2013-11-22 ENCOUNTER — Ambulatory Visit
Admission: RE | Admit: 2013-11-22 | Discharge: 2013-11-22 | Disposition: A | Discharge status: Home / Self Care | Payer: Medicare Other | Source: Ambulatory Visit | Attending: Internal Medicine | Admitting: Internal Medicine

## 2013-11-22 DIAGNOSIS — K7460 Unspecified cirrhosis of liver: Secondary | ICD-10-CM | POA: Diagnosis not present

## 2013-11-22 DIAGNOSIS — C220 Liver cell carcinoma: Secondary | ICD-10-CM

## 2013-11-29 ENCOUNTER — Ambulatory Visit (INDEPENDENT_AMBULATORY_CARE_PROVIDER_SITE_OTHER): Payer: Medicare Other | Admitting: Internal Medicine

## 2013-11-29 ENCOUNTER — Encounter: Payer: Self-pay | Admitting: Internal Medicine

## 2013-11-29 VITALS — BP 126/80 | HR 68 | Temp 98.6°F | Ht 69.0 in | Wt 217.0 lb

## 2013-11-29 DIAGNOSIS — K7460 Unspecified cirrhosis of liver: Secondary | ICD-10-CM

## 2013-11-29 DIAGNOSIS — B182 Chronic viral hepatitis C: Secondary | ICD-10-CM | POA: Diagnosis not present

## 2013-11-29 DIAGNOSIS — J019 Acute sinusitis, unspecified: Secondary | ICD-10-CM

## 2013-11-29 DIAGNOSIS — E119 Type 2 diabetes mellitus without complications: Secondary | ICD-10-CM

## 2013-11-29 MED ORDER — METHYLPREDNISOLONE ACETATE 80 MG/ML IJ SUSP
80.0000 mg | Freq: Once | INTRAMUSCULAR | Status: AC
  Administered 2013-11-29: 80 mg via INTRAMUSCULAR

## 2013-11-29 MED ORDER — AMOXICILLIN 500 MG PO CAPS: 500.0000 mg | ORAL_CAPSULE | Freq: Three times a day (TID) | ORAL | Status: DC

## 2013-11-29 NOTE — Patient Instructions (Signed)
You had been given injection of Depo-Medrol 80 mg IM today for sinusitis and congestion. Take amoxicillin 500 mg 3 times daily for 10 days for sinusitis. For headache and myalgias you may take Norco 5/325 one tablet daily. Return December 22 for physical exam and lab work including hemoglobin A1c.

## 2013-11-29 NOTE — Progress Notes (Signed)
   Subjective:    Patient ID: Victoria [name], female    DOB: [birth year], 72 y.o.   MRN: [id number]  HPI 72 year old Black female with history of diabetes mellitus treated with oral agents, history of hepatitis C and cirrhosis seen by [facility] on [facility], in today with URI symptoms since early November. Symptoms started after returning from a cruise to [place]. Has had cough and congestion. Has sinus pressure in maxillary sinus area bilaterally. Has headache. No fever or chills. Recently, hepatologist ordered ultrasound of abdomen to rule out hepatoma. Patient did not have hepatoma. They're considering starting new medication for hepatitis C which patient is agreeable to taking. She also has an upcoming physical examination here at this office December 22. Says she could not wait any longer needed to be seen acutely today. Denies discolored nasal drainage. Denies cough or sputum production. Slight sore throat. Denies urinary tract infection symptoms.    Review of Systems     Objective:   Physical Exam HEENT exam: TMs are slightly full bilaterally but not red. Pharynx only very slightly injected without exudate. Sounds nasally congested. Neck is supple without significant adenopathy. Chest is clear to auscultation without rales or wheezing. Skin is warm and dry. No scleral icterus. Sclera are slightly injected. No drainage from eyes.        Assessment & Plan:  Sinusitis  History of allergic rhinitis  History of Hepatitis C  History of cirrhosis  Plan: Amoxicillin 500 mg 3 times daily for 10 days. Depo-Medrol 80 mg IM for congestion. Return December 22 for physical examination and lab work including hemoglobin A1c. Patient asking for something for pain. Prescribed Norco 5/325 one tablet daily-- with history of cirrhosis I do not want her to take any more than this.  25 minutes spent with patient

## 2013-12-13 ENCOUNTER — Encounter: Payer: Self-pay | Admitting: Internal Medicine

## 2013-12-13 ENCOUNTER — Other Ambulatory Visit: Payer: Self-pay | Admitting: Internal Medicine

## 2013-12-13 ENCOUNTER — Ambulatory Visit (INDEPENDENT_AMBULATORY_CARE_PROVIDER_SITE_OTHER): Payer: Medicare Other | Admitting: Internal Medicine

## 2013-12-13 VITALS — BP 122/70 | HR 70 | Resp 18 | Wt 214.0 lb

## 2013-12-13 DIAGNOSIS — B182 Chronic viral hepatitis C: Secondary | ICD-10-CM

## 2013-12-13 DIAGNOSIS — I10 Essential (primary) hypertension: Secondary | ICD-10-CM | POA: Diagnosis not present

## 2013-12-13 DIAGNOSIS — Z0000 Encounter for general adult medical examination without abnormal findings: Secondary | ICD-10-CM

## 2013-12-13 DIAGNOSIS — Z853 Personal history of malignant neoplasm of breast: Secondary | ICD-10-CM

## 2013-12-13 DIAGNOSIS — E119 Type 2 diabetes mellitus without complications: Secondary | ICD-10-CM

## 2013-12-13 LAB — POCT URINALYSIS DIPSTICK

## 2013-12-13 LAB — HEMOGLOBIN A1C
Hgb A1c MFr Bld: 6.2 % — ABNORMAL HIGH (ref <5.7)
Mean Plasma Glucose: 131 mg/dL — ABNORMAL HIGH (ref <117)

## 2013-12-13 MED ORDER — ALBUTEROL SULFATE HFA 108 (90 BASE) MCG/ACT IN AERS: 2.0000 | INHALATION_SPRAY | Freq: Four times a day (QID) | RESPIRATORY_TRACT | Status: DC | PRN

## 2013-12-13 NOTE — Progress Notes (Signed)
Subjective:    Patient ID: Victoria [name], female    DOB: [birth year], 72 y.o.   MRN: [id number]  HPI 72 year old female with Diabetes mellitus and Chronic Hepatitis C followed at [facility]. Says DM fairly well controlled. Doesn't take accuchecks often but has home glucose monitor. Remote history of breast cancer. Had bilateral cataract extractions in Feb 2014 by Dr. Nile Riggs and eye exam at that time. She also has a history of hypertension.  History of allergic rhinitis. History of trigger finger right third finger.  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 There was a reexcision in September 2000 showing a single microscopic focus of intraductal carcinoma. She did have radiation therapy. Subsequently was lost to followup by oncology after 2005. 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 2 lymph nodes were negative for tumor. Tumor was estrogen receptor and progesterone receptor positive. She subsequently referred to radiation oncology for further radiation of remaining right breast tissue. She had been on estrogen replacement since 1997 at the time of her diagnosis. There is no family history of breast cancer. Tamoxifen was recommended but patient declined to take it. History of abnormal Pap smear 2005.   In April 2014 she had gallstone pancreatitis and underwent cholecystectomy by Dr. Ezzard Standing. Liver biopsy revealed cirrhosis.  History of partial left thyroidectomy at [facility] in [place] in 1972. Has never been on thyroid replacement.  Social history: Does not smoke or consume alcohol. 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  Family history: Sister died of colon cancer. She has 3 children.     Review of Systems  HENT: Negative.     Respiratory: Negative.   Cardiovascular: Negative.   Gastrointestinal: Negative.   Genitourinary: Negative.   Allergic/Immunologic: Positive for environmental allergies.  Neurological: Negative.   Hematological: Negative.   Psychiatric/Behavioral: Negative.        Objective:   Physical Exam  Vitals reviewed. Constitutional: She is oriented to person, place, and time. She appears well-developed and well-nourished. No distress.  HENT:  Head: Normocephalic and atraumatic.  Right Ear: External ear normal.  Left Ear: External ear normal.  Mouth/Throat: Oropharynx is clear and moist. No oropharyngeal exudate.  Eyes: Conjunctivae and EOM are normal. Pupils are equal, round, and reactive to light. Right eye exhibits no discharge. Left eye exhibits no discharge. No scleral icterus.  Neck: Neck supple. No JVD present. No thyromegaly present.  Cardiovascular: Normal rate, regular rhythm, normal heart sounds and intact distal pulses.   No murmur heard. Pulmonary/Chest: Effort normal and breath sounds normal. No respiratory distress. She has no wheezes. She has no rales. She exhibits no tenderness.  Abdominal: Soft. Bowel sounds are normal. She exhibits no distension and no mass. There is no tenderness. There is no rebound and no guarding.  Musculoskeletal: Normal range of motion. She exhibits no edema.  Lymphadenopathy:    She has no cervical adenopathy.  Neurological: She is oriented to person, place, and time. She has normal reflexes. No cranial nerve deficit.  Skin: Skin is warm and dry. No rash noted. She is not diaphoretic.  Psychiatric: She has a normal mood and affect. Her behavior is normal. Judgment and thought content normal.          Assessment & Plan:  Controlled type  2 diabetes  Hypertension  History of breast cancer  History of hepatitis C  History of partial left thyroidectomy and has never been on thyroid replacement  Plan: Return in 6 months. Watch diet. Try to  exercise.     Subjective:   Patient presents for Medicare Annual/Subsequent preventive examination.   Review Past Medical/Family/Social: See above   Risk Factors  Current exercise habits: walk Dietary issues discussed: low fat low carb  Cardiac risk factors: DM  Depression Screen  (Note: if answer to either of the following is "Yes", a more complete depression screening is indicated)   Over the past two weeks, have you felt down, depressed or hopeless? No  Over the past two weeks, have you felt little interest or pleasure in doing things? No Have you lost interest or pleasure in daily life? No Do you often feel hopeless? No Do you cry easily over simple problems? No   Activities of Daily Living  In your present state of health, do you have any difficulty performing the following activities?:   Driving? No  Managing money? No  Feeding yourself? No  Getting from bed to chair? No  Climbing a flight of stairs? No  Preparing food and eating?: No  Bathing or showering? No  Getting dressed: No  Getting to the toilet? No  Using the toilet:No  Moving around from place to place: No  In the past year have you fallen or had a near fall?:No  Are you sexually active? yes Do you have more than one partner? No   Hearing Difficulties: No  Do you often ask people to speak up or repeat themselves? No  Do you experience ringing or noises in your ears? No  Do you have difficulty understanding soft or whispered voices? No  Do you feel that you have a problem with memory? No Do you often misplace items? No    Home Safety:  Do you have a smoke alarm at your residence? Yes Do you have grab bars in the bathroom? no Do you have throw rugs in your house? no   Cognitive Testing  Alert? Yes Normal Appearance?Yes  Oriented to person? Yes Place? Yes  Time? Yes  Recall of three objects? Yes  Can perform simple calculations? Yes  Displays appropriate judgment?Yes  Can read the correct  time from a watch face?Yes   List the Names of Other Physician/Practitioners you currently use:  See referral list for the physicians patient is currently seeing.  Dr. Nile Riggs Hepatologist   Review of Systems: See above   Objective:     General appearance: Appears stated age  Head: Normocephalic, without obvious abnormality, atraumatic  Eyes: conj clear, EOMi PEERLA  Ears: normal TM's and external ear canals both ears  Nose: Nares normal. Septum midline. Mucosa normal. No drainage or sinus tenderness.  Throat: lips, mucosa, and tongue normal; teeth and gums normal  Neck: no adenopathy, no carotid bruit, no JVD, supple, symmetrical, trachea midline and thyroid not enlarged, symmetric, no tenderness/mass/nodules  No CVA tenderness.  Lungs: clear to auscultation bilaterally  Breasts: right lumpectomy. Heart: regular rate and rhythm, S1, S2 normal, no murmur, click, rub or gallop  Abdomen: soft, non-tender; bowel sounds normal; no masses, no organomegaly  Musculoskeletal: ROM normal in all joints, no crepitus, no deformity, Normal muscle strengthen. Back  is symmetric, no curvature. Skin: Skin color, texture, turgor normal. Chronic rash LEs. Lymph nodes: Cervical, supraclavicular, and axillary nodes normal.  Neurologic: CN 2 -12 Normal, Normal symmetric  reflexes. Normal coordination and gait  Psych: Alert & Oriented x 3, Mood appear stable.    Assessment:    Annual wellness medicare exam   Plan:    During the course of the visit the patient was educated and counseled about appropriate screening and preventive services including:   Annual mammogram  Annual diabetic eye exam     Patient Instructions (the written plan) was given to the patient.  Medicare Attestation  I have personally reviewed:  The patient's medical and social history  Their use of alcohol, tobacco or illicit drugs  Their current medications and supplements  The patient's functional ability including  ADLs,fall risks, home safety risks, cognitive, and hearing and visual impairment  Diet and physical activities  Evidence for depression or mood disorders  The patient's weight, height, BMI, and visual acuity have been recorded in the chart. I have made referrals, counseling, and provided education to the patient based on review of the above and I have provided the patient with a written personalized care plan for preventive services.

## 2013-12-13 NOTE — Patient Instructions (Addendum)
Return in 6 months to follow up on diabetes. Watch diet and exercise.

## 2013-12-14 LAB — MICROALBUMIN / CREATININE URINE RATIO
Creatinine, Urine: 535.3 mg/dL
Microalb Creat Ratio: 7.8 mg/g (ref 0.0–30.0)
Microalb, Ur: 4.19 mg/dL — ABNORMAL HIGH (ref 0.00–1.89)

## 2014-02-02 DIAGNOSIS — B182 Chronic viral hepatitis C: Secondary | ICD-10-CM | POA: Diagnosis not present

## 2014-02-07 ENCOUNTER — Other Ambulatory Visit: Payer: Self-pay | Admitting: Internal Medicine

## 2014-02-16 ENCOUNTER — Telehealth: Payer: Self-pay | Admitting: Internal Medicine

## 2014-02-16 NOTE — Telephone Encounter (Signed)
Please refill once 

## 2014-02-18 ENCOUNTER — Other Ambulatory Visit: Payer: Self-pay

## 2014-02-18 MED ORDER — HYDROCODONE-ACETAMINOPHEN 5-325 MG PO TABS: 1.0000 | ORAL_TABLET | Freq: Every day | ORAL | Status: DC | PRN

## 2014-03-17 ENCOUNTER — Telehealth: Payer: Self-pay | Admitting: Internal Medicine

## 2014-03-17 ENCOUNTER — Telehealth: Payer: Self-pay

## 2014-03-17 NOTE — Telephone Encounter (Signed)
Please call this pharmacy and ask what they gave her.  Can she get Losartan  And HCTZ separately? Should she try another pharmacy?

## 2014-03-18 ENCOUNTER — Ambulatory Visit (INDEPENDENT_AMBULATORY_CARE_PROVIDER_SITE_OTHER): Payer: Medicare Other | Admitting: Internal Medicine

## 2014-03-18 ENCOUNTER — Encounter: Payer: Self-pay | Admitting: Internal Medicine

## 2014-03-18 VITALS — BP 146/90 | HR 72 | Temp 98.2°F | Wt 216.0 lb

## 2014-03-18 DIAGNOSIS — E119 Type 2 diabetes mellitus without complications: Secondary | ICD-10-CM

## 2014-03-18 DIAGNOSIS — K7460 Unspecified cirrhosis of liver: Secondary | ICD-10-CM | POA: Diagnosis not present

## 2014-03-18 DIAGNOSIS — R21 Rash and other nonspecific skin eruption: Secondary | ICD-10-CM

## 2014-03-18 DIAGNOSIS — B182 Chronic viral hepatitis C: Secondary | ICD-10-CM

## 2014-03-18 DIAGNOSIS — I10 Essential (primary) hypertension: Secondary | ICD-10-CM | POA: Diagnosis not present

## 2014-03-18 LAB — GLUCOSE, POCT (MANUAL RESULT ENTRY)

## 2014-03-18 MED ORDER — LOSARTAN POTASSIUM 100 MG PO TABS: 100.0000 mg | ORAL_TABLET | Freq: Every day | ORAL | Status: DC

## 2014-03-18 MED ORDER — GLIPIZIDE 5 MG PO TABS: 5.0000 mg | ORAL_TABLET | Freq: Two times a day (BID) | ORAL | Status: DC

## 2014-03-18 MED ORDER — HYDROCHLOROTHIAZIDE 25 MG PO TABS: 25.0000 mg | ORAL_TABLET | Freq: Every day | ORAL | Status: DC

## 2014-03-18 NOTE — Telephone Encounter (Signed)
Coming in for meds check as she said she stopped most of her medications

## 2014-03-18 NOTE — Progress Notes (Signed)
   Subjective:    Patient ID: Victoria [name], female    DOB: [birth year], 73 y.o.   MRN: [id number]  HPI This lady insists that she was given a different generic brand of Hyzaar and that is making her feel achy. She asked the pharmacist about it and they could only conclude it might be the dye in the product. She's been on this for hypertension for some time. What I have done today is simply give her separate prescriptions for HCTZ and losartan. She is also complaining that metformin is causing her to itch. She has history of chronic hepatitis C and cirrhosis and sees hepatologist. Says she starts to it after taking metformin. Is not taking Accu-Cheks on a regular basis. Accu-Chek today in the office was 113. Metformin has worked well for her. We used to have her on Januvia. She developed pancreatitis and will not restart that.    Review of Systems     Objective:   Physical Exam Has not taken antihypertensive medication today and blood pressure is elevated. Chest clear. Cardiac exam regular rate and rhythm. Extremities without edema. Has multiple areas of hyperpigmentation where she has scratched on arms and legs.       Assessment & Plan:  ? Reaction to generic brand of Hyzaar  Type 2 diabetes mellitus  Hypertension  Dermatitis-not sure if it's drug related or not  Plan: Will try glipizide 5 mg before breakfast and supper. Discontinue metformin. She will take separate prescriptions of HCTZ and losartan instead of Hyzaar. If she continues tic she will need to see dermatologist. She needs to monitor her Accu-Cheks on this new medication.

## 2014-03-18 NOTE — Patient Instructions (Signed)
Take separate prescriptions of HCTZ and losartan. Start glipizide 5 mg before breakfast and supper. You need to monitor Accu-Cheks. If you continue to itch, you need to see dermatologist

## 2014-04-05 ENCOUNTER — Telehealth: Payer: Self-pay | Admitting: *Deleted

## 2014-04-05 ENCOUNTER — Other Ambulatory Visit: Payer: Self-pay | Admitting: Internal Medicine

## 2014-04-05 NOTE — Telephone Encounter (Signed)
error 

## 2014-04-11 ENCOUNTER — Other Ambulatory Visit: Payer: Self-pay | Admitting: Nurse Practitioner

## 2014-04-11 DIAGNOSIS — C220 Liver cell carcinoma: Secondary | ICD-10-CM

## 2014-04-11 DIAGNOSIS — B182 Chronic viral hepatitis C: Secondary | ICD-10-CM | POA: Diagnosis not present

## 2014-04-14 ENCOUNTER — Ambulatory Visit
Admission: RE | Admit: 2014-04-14 | Discharge: 2014-04-14 | Disposition: A | Discharge status: Home / Self Care | Payer: Medicare Other | Source: Ambulatory Visit | Attending: Nurse Practitioner | Admitting: Nurse Practitioner

## 2014-04-14 DIAGNOSIS — C220 Liver cell carcinoma: Secondary | ICD-10-CM

## 2014-04-14 DIAGNOSIS — K7460 Unspecified cirrhosis of liver: Secondary | ICD-10-CM | POA: Diagnosis not present

## 2014-05-11 ENCOUNTER — Encounter: Payer: Self-pay | Admitting: *Deleted

## 2014-05-11 ENCOUNTER — Encounter: Payer: Medicare Other | Attending: Internal Medicine | Admitting: *Deleted

## 2014-05-11 VITALS — Ht 69.0 in | Wt 218.3 lb

## 2014-05-11 DIAGNOSIS — Z713 Dietary counseling and surveillance: Secondary | ICD-10-CM | POA: Diagnosis not present

## 2014-05-11 DIAGNOSIS — E119 Type 2 diabetes mellitus without complications: Secondary | ICD-10-CM

## 2014-05-11 NOTE — Patient Instructions (Signed)
Plan:  Aim for 2-3 Carb Choices per meal (30-45 grams) +/- 1 either way  Aim for 0-15 Carbs per snack if hungry  Include protein in moderation with your meals and snacks Consider reading food labels for Total Carbohydrate and Fat Grams of foods Consider  increasing your activity level by walking for 30 minutes daily as tolerated Consider checking BG at alternate times per day to include fasting and 2hours after dinner as directed by MD  continue taking medication  as directed by MD  Consider adding yogurt or protein powder to Smoothie Consider Lynnae Sandhoff & nature's Own reduced calorie bread (54-00QQP) per slice

## 2014-05-11 NOTE — Progress Notes (Signed)
Appt start time: 0800 end time:  0930.  Assessment:  Patient was seen on  05/11/14 for individual diabetes education. Angellica requested to come for DSME because "I was out of control"  "I needed the support".  Current HbA1c: 6.2%  Preferred Learning Style:   No preference indicated   Learning Readiness:   Ready   MEDICATIONS: See List: Metformin  DIETARY INTAKE: Avoids Red Meat  B ( AM): oatmeal 1C (Superfood powder) (splenda) , 4oz OJ Snk ( AM): Pistachios (4g CHO) L ( PM): [place] and swiss sandwich, avacado, fruit Snk ( PM): Belvita biscuits (2) 18g cho /  D ( PM): beans, brown rice, chicken, [place] burger, broccoli, salad Snk ( PM): Mike & Ike candy / ice cream (Vanilla, food lion regular) Beverages: water, skim milk  Usual physical activity: Walks 20 minutes 3 X weekly  Intervention:  Nutrition counseling provided.  Discussed diabetes disease process and treatment options.  Discussed physiology of diabetes and role of obesity on insulin resistance.  Encouraged moderate weight reduction to improve glucose levels.  Discussed role of medications and diet in glucose control  Provided education on macronutrients on glucose levels.  Provided education on carb counting, importance of regularly scheduled meals/snacks, and meal planning  Discussed effects of physical activity on glucose levels and long-term glucose control.  Recommended 150 minutes of physical activity/week.  Reviewed patient medications.  Discussed role of medication on blood glucose and possible side effects  Discussed blood glucose monitoring and interpretation.  Discussed recommended target ranges and individual ranges.    Described short-term complications: hyper- and hypo-glycemia.  Discussed causes,symptoms, and treatment options.  Discussed prevention, detection, and treatment of long-term complications.  Discussed the role of prolonged elevated glucose levels on body systems.  Discussed role of stress  on blood glucose levels and discussed strategies to manage psychosocial issues.  Discussed recommendations for long-term diabetes self-care.  Established checklist for medical, dental, and emotional self-care.  Plan:  Aim for 2-3 Carb Choices per meal (30-45 grams) +/- 1 either way  Aim for 0-15 Carbs per snack if hungry  Include protein in moderation with your meals and snacks Consider reading food labels for Total Carbohydrate and Fat Grams of foods Consider  increasing your activity level by walking for 30 minutes daily as tolerated Consider checking BG at alternate times per day to include fasting and 2hours after dinner as directed by MD  continue taking medication  as directed by MD  Consider adding yogurt or protein powder to Smoothie Consider sara Lee & nature's Own reduced calorie bread (07-37TGG) per slice   Teaching Method Utilized:  Visual Auditory Hands on  Handouts given during visit include: Carb Counting  Meal Plan Card Snack Sheet My Plate Method  Barriers to learning/adherence to lifestyle change: remaining structured in meal plan  Diabetes self-care support plan:   [facility] support group   Demonstrated degree of understanding via:  Teach Back   Monitoring/Evaluation:  Dietary intake, exercise, test glucose, and body weight f/u prn.

## 2014-05-31 ENCOUNTER — Telehealth: Payer: Self-pay | Admitting: Internal Medicine

## 2014-05-31 ENCOUNTER — Other Ambulatory Visit: Payer: Self-pay

## 2014-05-31 DIAGNOSIS — B182 Chronic viral hepatitis C: Secondary | ICD-10-CM | POA: Diagnosis not present

## 2014-05-31 MED ORDER — ALPRAZOLAM 0.5 MG PO TABS: 0.5000 mg | ORAL_TABLET | Freq: Two times a day (BID) | ORAL | Status: DC | PRN

## 2014-05-31 NOTE — Telephone Encounter (Signed)
Please call in #[phone] Xanax 0.5mg  bid with no refill

## 2014-05-31 NOTE — Telephone Encounter (Signed)
Patient informed. 

## 2014-06-07 DIAGNOSIS — B182 Chronic viral hepatitis C: Secondary | ICD-10-CM | POA: Diagnosis not present

## 2014-06-14 ENCOUNTER — Encounter: Payer: Self-pay | Admitting: Internal Medicine

## 2014-06-14 ENCOUNTER — Ambulatory Visit (INDEPENDENT_AMBULATORY_CARE_PROVIDER_SITE_OTHER): Payer: Medicare Other | Admitting: Internal Medicine

## 2014-06-14 VITALS — BP 122/80 | HR 76 | Wt 211.0 lb

## 2014-06-14 DIAGNOSIS — E119 Type 2 diabetes mellitus without complications: Secondary | ICD-10-CM | POA: Diagnosis not present

## 2014-06-14 DIAGNOSIS — I10 Essential (primary) hypertension: Secondary | ICD-10-CM | POA: Diagnosis not present

## 2014-06-14 DIAGNOSIS — B182 Chronic viral hepatitis C: Secondary | ICD-10-CM

## 2014-06-14 DIAGNOSIS — K7460 Unspecified cirrhosis of liver: Secondary | ICD-10-CM

## 2014-06-14 DIAGNOSIS — Z8639 Personal history of other endocrine, nutritional and metabolic disease: Secondary | ICD-10-CM | POA: Diagnosis not present

## 2014-06-14 DIAGNOSIS — E559 Vitamin D deficiency, unspecified: Secondary | ICD-10-CM | POA: Diagnosis not present

## 2014-06-14 LAB — BASIC METABOLIC PANEL
BUN: 19 mg/dL (ref 6–23)
CO2: 30 mEq/L (ref 19–32)
Calcium: 10.1 mg/dL (ref 8.4–10.5)
Chloride: 100 mEq/L (ref 96–112)
Creat: 0.93 mg/dL (ref 0.50–1.10)
Glucose, Bld: 143 mg/dL — ABNORMAL HIGH (ref 70–99)
Potassium: 4.2 mEq/L (ref 3.5–5.3)
Sodium: 139 mEq/L (ref 135–145)

## 2014-06-14 LAB — HEMOGLOBIN A1C
Hgb A1c MFr Bld: 6.3 % — ABNORMAL HIGH (ref <5.7)
Mean Plasma Glucose: 134 mg/dL — ABNORMAL HIGH (ref <117)

## 2014-06-14 NOTE — Progress Notes (Signed)
   Subjective:    Patient ID: Victoria [name], female    DOB: December 26, 1940, 73 y.o.   MRN: [id number]  HPI Patient now receiving 12 week course of Harvoni for Hepatitis C and feels well. Says diabetes is under good control. Labs are called today and are pending including hemoglobin A1c. Tetanus immunization is due but were not defer that until December because she will complete her medication for Hepatitis C [name] 10 and do not want to give a vaccination at this point in time. This is a 12 week course with one tablet daily. She is seen at the liver clinic. Says liver functions have normalized in that her viral load has decreased to a nondetectable level. She has energy and she looks like she feels much better and looks younger.    Review of Systems     Objective:   Physical Exam  Skin warm and dry. Nodes none. Chest clear to auscultation. Cardiac exam regular rate and rhythm normal S1 and S2. Neck is supple without thyromegaly or carotid bruits. Extremities without edema. Diabetic foot exam. No ulcers or calluses. Pulses are normal in her feet.      Assessment & Plan:  Type 2 diabetes mellitus  Chronic hepatitis C  History of breast cancer  Hypertension  Plan: Return late December 2015 or  early January 2016 for physical examination. Continue same medications for diabetes. Labs are drawn and are pending. Reminded about annual diabetic eye exam.

## 2014-06-14 NOTE — Addendum Note (Signed)
Addended by: Brett Canales on: 06/14/2014 12:37 PM   Modules accepted: Orders

## 2014-06-14 NOTE — Patient Instructions (Signed)
Continue same medications and return in 6 months for physical examination. Labs are pending. Vaccines are deferred do to being on Harvoni. Reminded about diabetic eye exam.

## 2014-06-15 LAB — VITAMIN D 25 HYDROXY (VIT D DEFICIENCY, FRACTURES): Vit D, 25-Hydroxy: 52 ng/mL (ref 30–89)

## 2014-06-20 ENCOUNTER — Encounter: Payer: Self-pay | Admitting: *Deleted

## 2014-07-05 ENCOUNTER — Encounter: Payer: Self-pay | Admitting: Gastroenterology

## 2014-07-15 ENCOUNTER — Ambulatory Visit (INDEPENDENT_AMBULATORY_CARE_PROVIDER_SITE_OTHER): Payer: Medicare Other | Admitting: Internal Medicine

## 2014-07-15 ENCOUNTER — Encounter: Payer: Self-pay | Admitting: Internal Medicine

## 2014-07-15 ENCOUNTER — Telehealth: Payer: Self-pay

## 2014-07-15 VITALS — BP 120/90 | Temp 98.5°F | Wt 211.0 lb

## 2014-07-15 DIAGNOSIS — J069 Acute upper respiratory infection, unspecified: Secondary | ICD-10-CM | POA: Diagnosis not present

## 2014-07-15 MED ORDER — AMOXICILLIN 500 MG PO CAPS: 500.0000 mg | ORAL_CAPSULE | Freq: Three times a day (TID) | ORAL | Status: DC

## 2014-07-15 MED ORDER — FLUTICASONE PROPIONATE 50 MCG/ACT NA SUSP: NASAL | Status: DC

## 2014-07-15 MED ORDER — METHYLPREDNISOLONE ACETATE 80 MG/ML IJ SUSP
80.0000 mg | Freq: Once | INTRAMUSCULAR | Status: AC
  Administered 2014-07-15: 80 mg via INTRAMUSCULAR

## 2014-07-15 NOTE — Telephone Encounter (Signed)
Patient calls to report she has sinus pressure and pain. No fever or chills. No colored drainage. Some cough present. She is taking Tylenol sinus and Zyrtec. Has Flonase, but says nothing is helping. Now patient presents in the office saying she will go to the ED if nothing can be done for her now. Consulted with Dr. Renold Genta who has agreed to see her, but as a work-in.

## 2014-07-15 NOTE — Addendum Note (Signed)
Addended by: Brett Canales on: 07/15/2014 04:32 PM   Modules accepted: Orders

## 2014-07-15 NOTE — Patient Instructions (Signed)
Depo-Medrol 80 mg IM given today for congestion. Take amoxicillin 500 mg 3 times daily for 10 days. Use Flonase nasal spray as corrected. We will place colonoscopy order for you with [facility]. Consider allergy testing.

## 2014-07-15 NOTE — Progress Notes (Signed)
   Subjective:    Patient ID: Victoria [name], female    DOB: 06-03-1941, 73 y.o.   MRN: [id number]  HPI  Patient is being treated with Harvoni for Hepatitis C and has about another week left of treatment before completing entire course. She's done well with it. She feels much better. However has come down with respiratory infection of the past several days. Has felt achy like she has the flu. No sore throat. Has cough. No significant sputum production. Has left maxillary sinus pain and tenderness. Has been taking Tylenol Sinus without relief. Walked in today without an appointment. Has not been using Flonase nasal spray. Has not been allergy tested in the past. Says that she has a lot of recurrent sinus infections with last one she was treated for here was December 2014 with amoxicillin and Depo-Medrol .Apparently she did well with that treatment and did not return for this complaint until now.    Review of Systems     Objective:   Physical Exam Slightly boggy nasal mucosa. Pharynx clear. Has dry cough. TMs are clear. Neck is supple without adenopathy. Chest clear to auscultation.       Assessment & Plan:  Acute URI  Plan: Amoxicillin 500 mg 3 times a day for 10 days. Depo-Medrol 80 mg IM. If symptoms do not improve, consider allergy testing. Represcribed Flonase nasal spray 2 sprays in each nostril daily with refills.  Also patient brought up that she needs to have colonoscopy. She has seen Dr. Erskine [name] at [facility] for GI issues previously. We will put an order for colonoscopy and she can contact that office.

## 2014-07-18 ENCOUNTER — Telehealth: Payer: Self-pay

## 2014-07-18 DIAGNOSIS — Z1211 Encounter for screening for malignant neoplasm of colon: Secondary | ICD-10-CM

## 2014-07-18 NOTE — Telephone Encounter (Signed)
Ambulatory referral done in Epic for screening colon.

## 2014-07-19 ENCOUNTER — Other Ambulatory Visit: Payer: Self-pay | Admitting: Internal Medicine

## 2014-07-20 ENCOUNTER — Encounter: Payer: Self-pay | Admitting: Gastroenterology

## 2014-07-26 DIAGNOSIS — B182 Chronic viral hepatitis C: Secondary | ICD-10-CM | POA: Diagnosis not present

## 2014-08-01 DIAGNOSIS — K7460 Unspecified cirrhosis of liver: Secondary | ICD-10-CM | POA: Diagnosis not present

## 2014-08-01 DIAGNOSIS — B182 Chronic viral hepatitis C: Secondary | ICD-10-CM | POA: Diagnosis not present

## 2014-08-02 ENCOUNTER — Other Ambulatory Visit: Payer: Self-pay | Admitting: Nurse Practitioner

## 2014-08-02 DIAGNOSIS — C220 Liver cell carcinoma: Secondary | ICD-10-CM

## 2014-09-12 ENCOUNTER — Ambulatory Visit (AMBULATORY_SURGERY_CENTER): Payer: Self-pay | Admitting: *Deleted

## 2014-09-12 VITALS — Ht 69.0 in | Wt 219.8 lb

## 2014-09-12 DIAGNOSIS — Z1211 Encounter for screening for malignant neoplasm of colon: Secondary | ICD-10-CM

## 2014-09-12 NOTE — Progress Notes (Signed)
No allergies to eggs or soy. No problems with anesthesia.  Pt given Emmi instructions for colonoscopy  No oxygen use  No diet drug use  

## 2014-09-26 ENCOUNTER — Ambulatory Visit (AMBULATORY_SURGERY_CENTER): Payer: Medicare Other | Admitting: Gastroenterology

## 2014-09-26 ENCOUNTER — Encounter: Payer: Self-pay | Admitting: Gastroenterology

## 2014-09-26 VITALS — BP 125/76 | HR 52 | Temp 97.3°F | Resp 19 | Ht 69.0 in | Wt 219.0 lb

## 2014-09-26 DIAGNOSIS — E119 Type 2 diabetes mellitus without complications: Secondary | ICD-10-CM | POA: Diagnosis not present

## 2014-09-26 DIAGNOSIS — B1920 Unspecified viral hepatitis C without hepatic coma: Secondary | ICD-10-CM | POA: Diagnosis not present

## 2014-09-26 DIAGNOSIS — K7460 Unspecified cirrhosis of liver: Secondary | ICD-10-CM | POA: Diagnosis not present

## 2014-09-26 DIAGNOSIS — Z86010 Personal history of colonic polyps: Secondary | ICD-10-CM

## 2014-09-26 DIAGNOSIS — Z1211 Encounter for screening for malignant neoplasm of colon: Secondary | ICD-10-CM | POA: Diagnosis not present

## 2014-09-26 DIAGNOSIS — I10 Essential (primary) hypertension: Secondary | ICD-10-CM | POA: Diagnosis not present

## 2014-09-26 DIAGNOSIS — K5730 Diverticulosis of large intestine without perforation or abscess without bleeding: Secondary | ICD-10-CM

## 2014-09-26 LAB — GLUCOSE, CAPILLARY
Glucose-Capillary: 94 mg/dL (ref 70–99)
Glucose-Capillary: 75 mg/dL (ref 70–99)
Glucose-Capillary: 96 mg/dL (ref 70–99)

## 2014-09-26 MED ORDER — SODIUM CHLORIDE 0.9 % IV SOLN: 500.0000 mL | INTRAVENOUS | Status: DC

## 2014-09-26 NOTE — Patient Instructions (Signed)
Colon polyp x 1 seen today, handout on polyps given. Repeat colonoscopy in 3 years. Diverticulosis seen also,handout given.   YOU HAD AN ENDOSCOPIC PROCEDURE TODAY AT [facility]: Refer to the procedure report that was given to you for any specific questions about what was found during the examination.  If the procedure report does not answer your questions, please call your gastroenterologist to clarify.  If you requested that your care partner not be given the details of your procedure findings, then the procedure report has been included in a sealed envelope for you to review at your convenience later.  YOU SHOULD EXPECT: Some feelings of bloating in the abdomen. Passage of more gas than usual.  Walking can help get rid of the air that was put into your GI tract during the procedure and reduce the bloating. If you had a lower endoscopy (such as a colonoscopy or flexible sigmoidoscopy) you may notice spotting of blood in your stool or on the toilet paper. If you underwent a bowel prep for your procedure, then you may not have a normal bowel movement for a few days.  DIET: Your first meal following the procedure should be a light meal and then it is ok to progress to your normal diet.  A half-sandwich or bowl of soup is an example of a good first meal.  Heavy or fried foods are harder to digest and may make you feel nauseous or bloated.  Likewise meals heavy in dairy and vegetables can cause extra gas to form and this can also increase the bloating.  Drink plenty of fluids but you should avoid alcoholic beverages for 24 hours.  ACTIVITY: Your care partner should take you home directly after the procedure.  You should plan to take it easy, moving slowly for the rest of the day.  You can resume normal activity the day after the procedure however you should NOT DRIVE or use heavy machinery for 24 hours (because of the sedation medicines used during the test).    SYMPTOMS TO REPORT  IMMEDIATELY: A gastroenterologist can be reached at any hour.  During normal business hours, 8:30 AM to 5:00 PM Monday through Friday, call [phone].  After hours and on weekends, please call the GI answering service at [phone] who will take a message and have the physician on call contact you.   Following lower endoscopy (colonoscopy or flexible sigmoidoscopy):  Excessive amounts of blood in the stool  Significant tenderness or worsening of abdominal pains  Swelling of the abdomen that is new, acute  Fever of 100F or higher  Following upper endoscopy (EGD)  Vomiting of blood or coffee ground material  New chest pain or pain under the shoulder blades  Painful or persistently difficult swallowing  New shortness of breath  Fever of 100F or higher  Black, tarry-looking stools  FOLLOW UP: If any biopsies were taken you will be contacted by phone or by letter within the next 1-3 weeks.  Call your gastroenterologist if you have not heard about the biopsies in 3 weeks.  Our staff will call the home number listed on your records the next business day following your procedure to check on you and address any questions or concerns that you may have at that time regarding the information given to you following your procedure. This is a courtesy call and so if there is no answer at the home number and we have not heard from you through the emergency physician on  call, we will assume that you have returned to your regular daily activities without incident.  SIGNATURES/CONFIDENTIALITY: You and/or your care partner have signed paperwork which will be entered into your electronic medical record.  These signatures attest to the fact that that the information above on your After Visit Summary has been reviewed and is understood.  Full responsibility of the confidentiality of this discharge information lies with you and/or your care-partner.

## 2014-09-26 NOTE — Progress Notes (Signed)
Report to PACU, RN, vss, BBS= Clear.  

## 2014-09-26 NOTE — Op Note (Signed)
[place]  [address] [place], [address]   COLONOSCOPY PROCEDURE REPORT  PATIENT: Victoria [name], Victoria [name]  MR#: [id number] BIRTHDATE: Nov 01, 1941 , 19  yrs. old GENDER: female ENDOSCOPIST: Inda Castle, MD REFERRED YW:VPXT Parke Simmers, M.D. PROCEDURE DATE:  09/26/2014 PROCEDURE:   Colonoscopy, diagnostic First Screening Colonoscopy - Avg.  risk and is 50 yrs.  old or older - No.  Prior Negative Screening - Now for repeat screening. 10 or more years since last screening  History of Adenoma - Now for follow-up colonoscopy & has been > or = to 3 yrs.  N/A  Polyps Removed Today? No.  Recommend repeat exam, <10 yrs? Yes.  High risk (family or personal hx). ASA CLASS:   Class II INDICATIONS:average risk for colon cancer. MEDICATIONS: Monitored anesthesia care and Propofol 260 mg IV  DESCRIPTION OF PROCEDURE:   After the risks benefits and alternatives of the procedure were thoroughly explained, informed consent was obtained.  The digital rectal exam revealed no abnormalities of the rectum.   The LB [id number]  endoscope was introduced through the anus and advanced to the cecum, which was identified by both the appendix and ileocecal valve. No adverse events experienced.   The quality of the prep was Suprep fair There was a moderate amount of retained liquid stool.The instrument was then slowly withdrawn as the colon was fully examined.      COLON FINDINGS: A sessile polyp measuring 34 mm in size was found at the splenic flexure.  Despite her long efforts to isolate the polyp and remove it, I was unable to locate the polyp again.  The polyp was obscured by retained, liquid stool. There was severe diverticulosis noted in the right colon, at the cecum, in the transverse colon, sigmoid colon, and descending colon.  Retroflexed views revealed no abnormalities. The time to cecum=4 minutes 34 seconds.  Withdrawal time=20 minutes 43 seconds.  The scope  was withdrawn and the procedure completed. COMPLICATIONS: There were no immediate complications.  ENDOSCOPIC IMPRESSION: 1.   Sessile polyp measuring 34 mm in size was found at the splenic flexure 2.   Severe diverticulosis was noted in the right colon, at the cecum, in the transverse colon, sigmoid colon, and descending colon   RECOMMENDATIONS: Colonoscopy 3 years for followup of colon polyp  eSigned:  Inda Castle, MD 09/26/2014 11:54 AM   cc:   PATIENT NAME:  [name], [name] MR#: [id number]

## 2014-09-27 ENCOUNTER — Telehealth: Payer: Self-pay

## 2014-09-27 NOTE — Telephone Encounter (Signed)
No answer, left vm 

## 2014-10-03 ENCOUNTER — Ambulatory Visit
Admission: RE | Admit: 2014-10-03 | Discharge: 2014-10-03 | Disposition: A | Discharge status: Home / Self Care | Payer: Medicare Other | Source: Ambulatory Visit | Attending: Nurse Practitioner | Admitting: Nurse Practitioner

## 2014-10-03 DIAGNOSIS — C220 Liver cell carcinoma: Secondary | ICD-10-CM

## 2014-10-03 DIAGNOSIS — K769 Liver disease, unspecified: Secondary | ICD-10-CM | POA: Diagnosis not present

## 2014-10-03 DIAGNOSIS — K7460 Unspecified cirrhosis of liver: Secondary | ICD-10-CM | POA: Diagnosis not present

## 2014-10-03 DIAGNOSIS — B1920 Unspecified viral hepatitis C without hepatic coma: Secondary | ICD-10-CM | POA: Diagnosis not present

## 2014-10-03 DIAGNOSIS — Z9049 Acquired absence of other specified parts of digestive tract: Secondary | ICD-10-CM | POA: Diagnosis not present

## 2014-10-07 ENCOUNTER — Other Ambulatory Visit: Payer: Self-pay

## 2014-10-11 ENCOUNTER — Other Ambulatory Visit: Payer: Self-pay

## 2014-10-11 MED ORDER — METFORMIN HCL 500 MG PO TABS: ORAL_TABLET | ORAL | Status: DC

## 2014-10-14 ENCOUNTER — Other Ambulatory Visit: Payer: Self-pay | Admitting: Nurse Practitioner

## 2014-10-14 DIAGNOSIS — K769 Liver disease, unspecified: Secondary | ICD-10-CM

## 2014-10-18 DIAGNOSIS — B182 Chronic viral hepatitis C: Secondary | ICD-10-CM | POA: Diagnosis not present

## 2014-11-14 ENCOUNTER — Ambulatory Visit
Admission: RE | Admit: 2014-11-14 | Discharge: 2014-11-14 | Disposition: A | Discharge status: Home / Self Care | Payer: Medicare Other | Source: Ambulatory Visit | Attending: Nurse Practitioner | Admitting: Nurse Practitioner

## 2014-11-14 DIAGNOSIS — K769 Liver disease, unspecified: Secondary | ICD-10-CM

## 2014-11-14 DIAGNOSIS — Z853 Personal history of malignant neoplasm of breast: Secondary | ICD-10-CM | POA: Diagnosis not present

## 2014-11-14 DIAGNOSIS — K7460 Unspecified cirrhosis of liver: Secondary | ICD-10-CM | POA: Diagnosis not present

## 2014-11-14 MED ORDER — GADOXETATE DISODIUM 0.25 MMOL/ML IV SOLN
10.0000 mL | Freq: Once | INTRAVENOUS | Status: AC | PRN
  Administered 2014-11-14: 10 mL via INTRAVENOUS

## 2014-11-28 ENCOUNTER — Other Ambulatory Visit: Payer: Self-pay

## 2014-11-28 ENCOUNTER — Telehealth: Payer: Self-pay

## 2014-11-28 MED ORDER — LOSARTAN POTASSIUM-HCTZ 100-25 MG PO TABS: 1.0000 | ORAL_TABLET | Freq: Every day | ORAL | Status: DC

## 2014-11-28 NOTE — Telephone Encounter (Signed)
Left message for patient to call office to let us know if/when they received flu vaccine. 

## 2014-12-04 IMAGING — CT CT ABD-PELV W/ CM
1 of 4 series · 14 of 32 positions shown, 19 images · IV contrast (OMNIPAQUE 300)
Comparison: None.

CLINICAL DATA: Abdominal pain

CT ABDOMEN AND PELVIS WITH CONTRAST
TECHNIQUE: Multidetector CT imaging of the abdomen and pelvis was
performed following the standard protocol during bolus
administration of intravenous contrast.
Contrast: 100mL OMNIPAQUE IOHEXOL 300 MG/ML  SOLN

[Series 2: abd/pel with · axial · 0.74mm/px · z∈[-535,-180]mm · 14 of 81 slices shown, 19 images]
[im 5/81  soft-tissue]
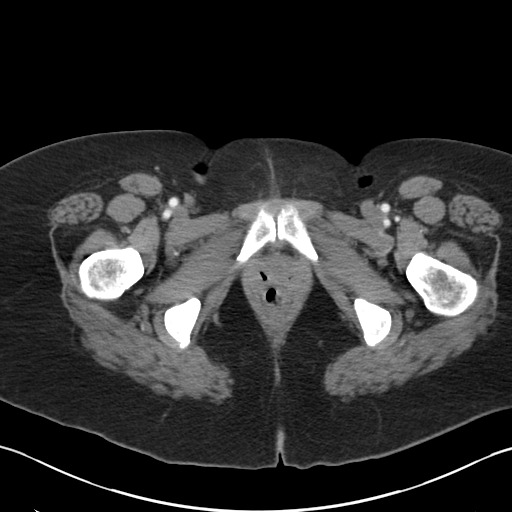
[im 5/81  bone]
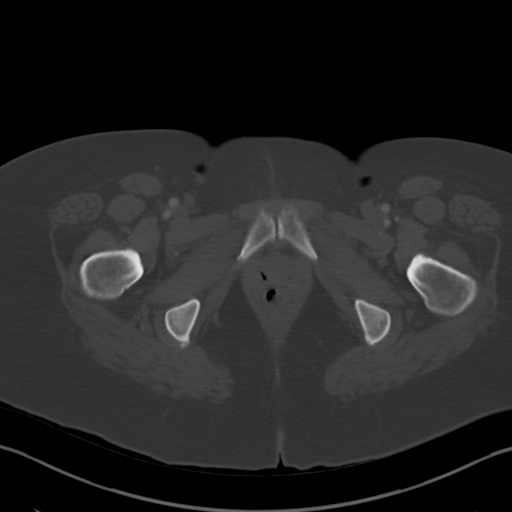
[im 13/81  soft-tissue]
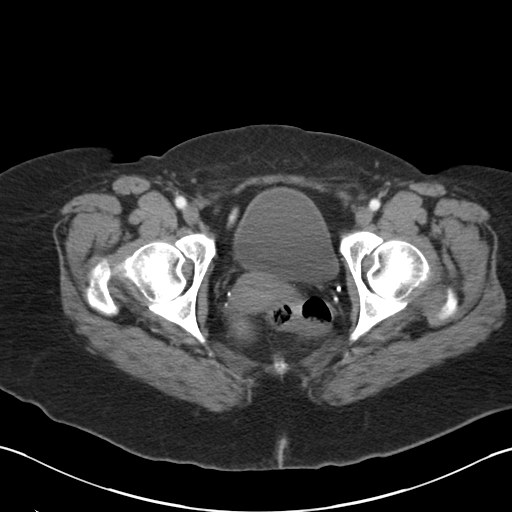
[im 17/81  soft-tissue]
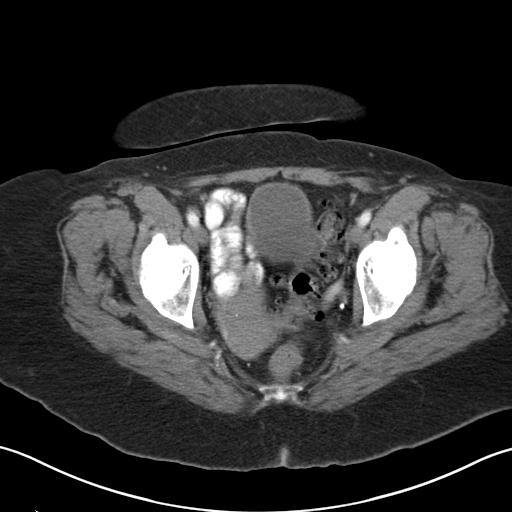
[im 22/81  soft-tissue]
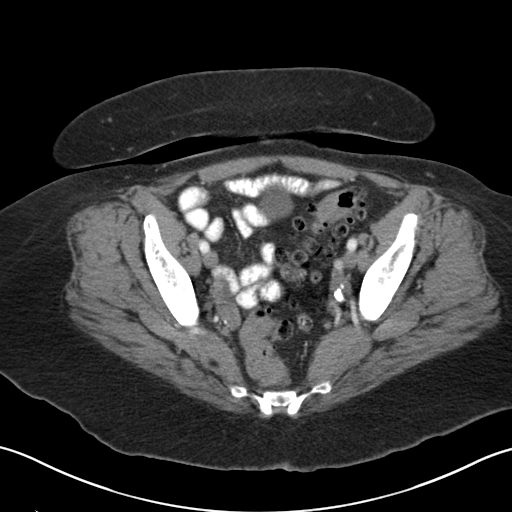
[im 30/81  soft-tissue]
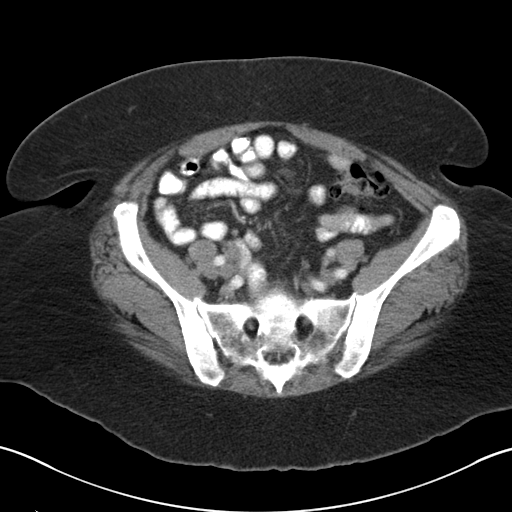
[im 34/81  soft-tissue]
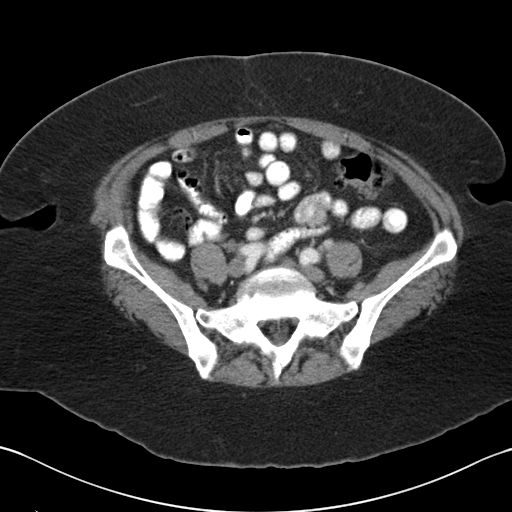
[im 43/81  soft-tissue]
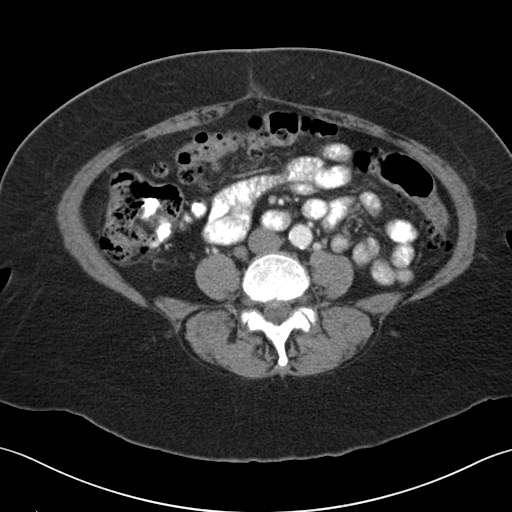
[im 47/81  soft-tissue]
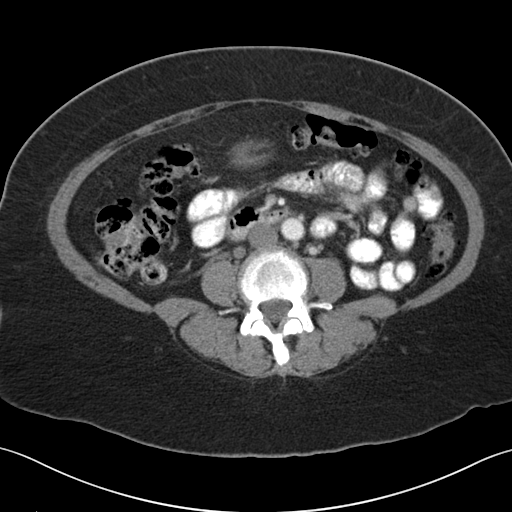
[im 51/81  soft-tissue]
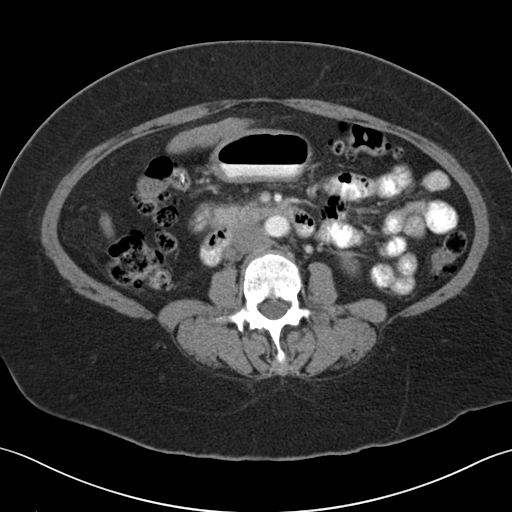
[im 51/81  bone]
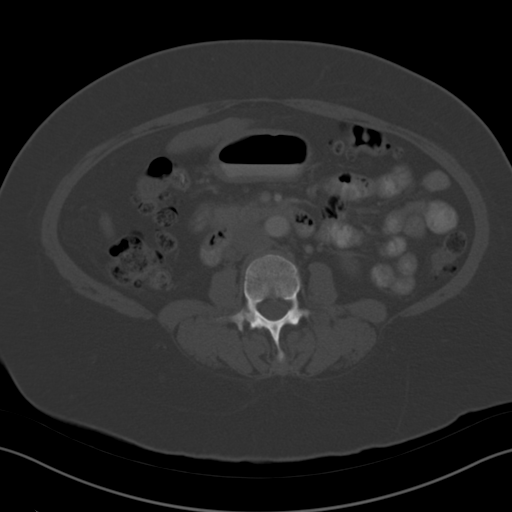
[im 59/81  soft-tissue]
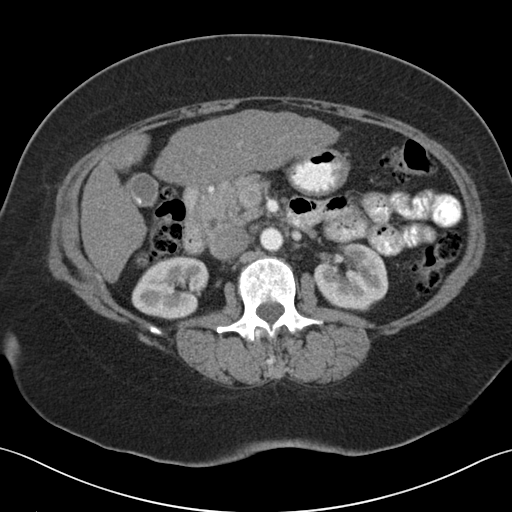
[im 64/81  soft-tissue]
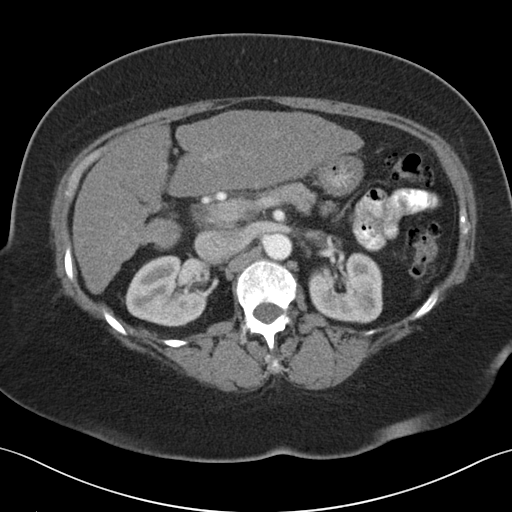
[im 64/81  lung]
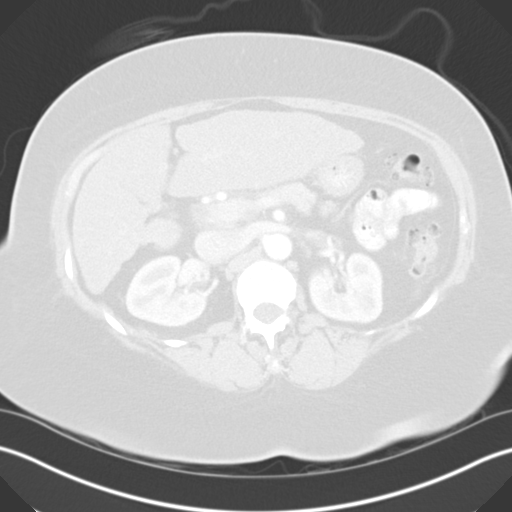
[im 68/81  soft-tissue]
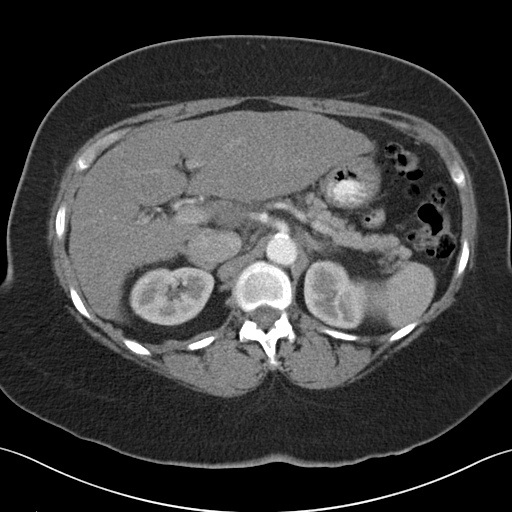
[im 68/81  lung]
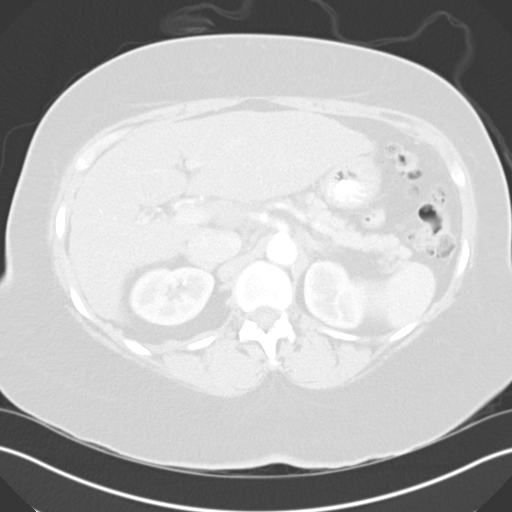
[im 72/81  lung]
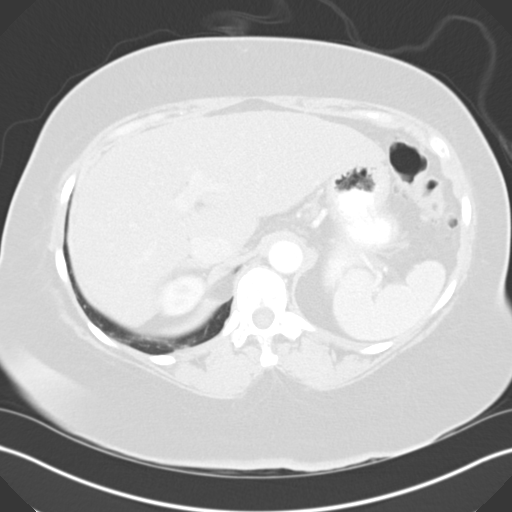
[im 76/81  soft-tissue]
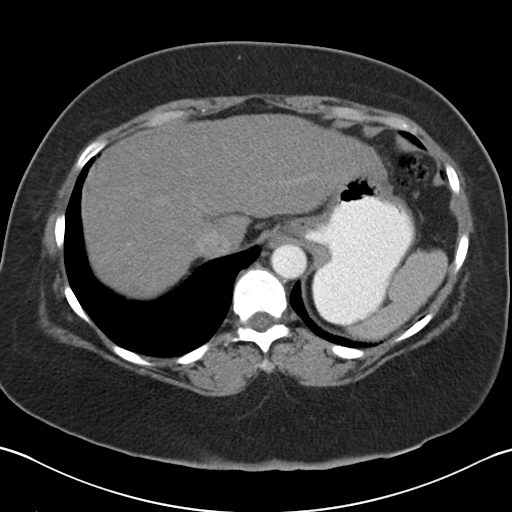
[im 76/81  lung]
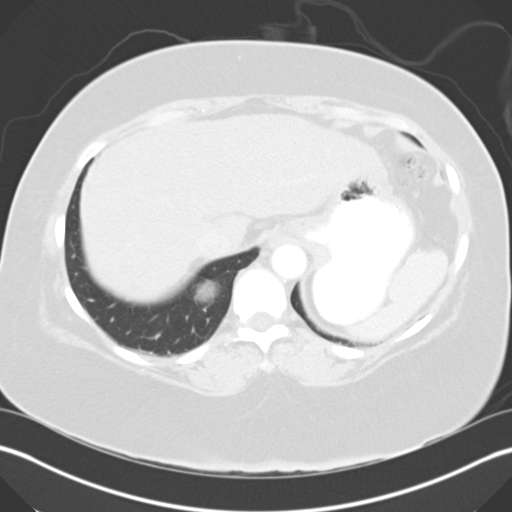

[14 of 32 positions shown; findings below may reference images not displayed]

FINDINGS: Several gallstones are noted.  Mild gallbladder wall
thickening.

Fat planes within the head of the pancreas are blurred.  There is
subtle stranding about the pancreatic head.  Findings are
suspicious for acute pancreatitis.

14 mm hypodensity is present in the head of the pancreas which is
indeterminate.  See image 21.

Diffuse hepatic steatosis.  The left lobe of the liver is enlarged
with a nodular contour compatible with cirrhosis.

Spleen, right adrenal gland, right kidney are within normal limits.
Inflammatory changes of the left adrenal gland are suspected.
Simple cyst in the lower pole of the left kidney.

Normal appendix.  Sigmoid diverticulosis without evidence of acute
diverticulitis.

Unremarkable bladder.  Uterus and adnexa are within normal limits.

Bilateral inguinal hernia containing adipose tissue.

Right gonadal vein is prominent and dilated.
IMPRESSION: Cholelithiasis.

Mild gallbladder wall thickening is nonspecific.

Cirrhotic liver.

Findings suggesting acute pancreatitis involving the head of the
pancreas.

Indeterminate 14 mm hypodensity in the head of the pancreas.
Neoplasm is not excluded.  At the minimum, short-term 3-month
follow-up CT can be performed to reassess.

Sigmoid diverticulosis without evidence of acute diverticulitis.

## 2014-12-13 LAB — HM DIABETES EYE EXAM

## 2014-12-19 ENCOUNTER — Other Ambulatory Visit: Payer: Medicare Other | Admitting: Internal Medicine

## 2014-12-19 DIAGNOSIS — K7460 Unspecified cirrhosis of liver: Secondary | ICD-10-CM | POA: Diagnosis not present

## 2014-12-19 DIAGNOSIS — Z1329 Encounter for screening for other suspected endocrine disorder: Secondary | ICD-10-CM

## 2014-12-19 DIAGNOSIS — Z1321 Encounter for screening for nutritional disorder: Secondary | ICD-10-CM

## 2014-12-19 DIAGNOSIS — E785 Hyperlipidemia, unspecified: Secondary | ICD-10-CM

## 2014-12-19 DIAGNOSIS — E139 Other specified diabetes mellitus without complications: Secondary | ICD-10-CM | POA: Diagnosis not present

## 2014-12-19 DIAGNOSIS — Z130 Encounter for screening for diseases of the blood and blood-forming organs and certain disorders involving the immune mechanism: Secondary | ICD-10-CM

## 2014-12-19 DIAGNOSIS — E039 Hypothyroidism, unspecified: Secondary | ICD-10-CM | POA: Diagnosis not present

## 2014-12-19 DIAGNOSIS — Z13220 Encounter for screening for lipoid disorders: Secondary | ICD-10-CM

## 2014-12-19 DIAGNOSIS — Z0000 Encounter for general adult medical examination without abnormal findings: Secondary | ICD-10-CM

## 2014-12-19 LAB — COMPREHENSIVE METABOLIC PANEL
ALT: 9 U/L (ref 0–35)
AST: 13 U/L (ref 0–37)
Albumin: 3.9 g/dL (ref 3.5–5.2)
Alkaline Phosphatase: 85 U/L (ref 39–117)
BUN: 16 mg/dL (ref 6–23)
CO2: 28 mEq/L (ref 19–32)
Calcium: 9.4 mg/dL (ref 8.4–10.5)
Chloride: 104 mEq/L (ref 96–112)
Creat: 0.83 mg/dL (ref 0.50–1.10)
Glucose, Bld: 121 mg/dL — ABNORMAL HIGH (ref 70–99)
Potassium: 4.5 mEq/L (ref 3.5–5.3)
Sodium: 139 mEq/L (ref 135–145)
Total Bilirubin: 0.4 mg/dL (ref 0.2–1.2)
Total Protein: 7.3 g/dL (ref 6.0–8.3)

## 2014-12-19 LAB — CBC WITH DIFFERENTIAL/PLATELET
Basophils Absolute: 0 10*3/uL (ref 0.0–0.1)
Basophils Relative: 0 % (ref 0–1)
Eosinophils Absolute: 0.1 10*3/uL (ref 0.0–0.7)
Eosinophils Relative: 2 % (ref 0–5)
HCT: 37.4 % (ref 36.0–46.0)
Hemoglobin: 12.3 g/dL (ref 12.0–15.0)
Lymphocytes Relative: 50 % — ABNORMAL HIGH (ref 12–46)
Lymphs Abs: 3.3 10*3/uL (ref 0.7–4.0)
MCH: 27.5 pg (ref 26.0–34.0)
MCHC: 32.9 g/dL (ref 30.0–36.0)
MCV: 83.7 fL (ref 78.0–100.0)
MPV: 9.1 fL — ABNORMAL LOW (ref 9.4–12.4)
Monocytes Absolute: 0.4 10*3/uL (ref 0.1–1.0)
Monocytes Relative: 6 % (ref 3–12)
Neutro Abs: 2.7 10*3/uL (ref 1.7–7.7)
Neutrophils Relative %: 42 % — ABNORMAL LOW (ref 43–77)
Platelets: 155 10*3/uL (ref 150–400)
RBC: 4.47 MIL/uL (ref 3.87–5.11)
RDW: 15.3 % (ref 11.5–15.5)
WBC: 6.5 10*3/uL (ref 4.0–10.5)

## 2014-12-19 LAB — HEMOGLOBIN A1C
Hgb A1c MFr Bld: 6.4 % — ABNORMAL HIGH (ref <5.7)
Mean Plasma Glucose: 137 mg/dL — ABNORMAL HIGH (ref <117)

## 2014-12-19 LAB — LIPID PANEL
Cholesterol: 144 mg/dL (ref 0–200)
HDL: 50 mg/dL (ref >39)
LDL Cholesterol: 81 mg/dL (ref 0–99)
Total CHOL/HDL Ratio: 2.9 Ratio
Triglycerides: 64 mg/dL (ref <150)
VLDL: 13 mg/dL (ref 0–40)

## 2014-12-20 ENCOUNTER — Other Ambulatory Visit: Payer: Self-pay | Admitting: *Deleted

## 2014-12-20 ENCOUNTER — Other Ambulatory Visit (HOSPITAL_COMMUNITY)
Admission: RE | Admit: 2014-12-20 | Discharge: 2014-12-20 | Disposition: A | Discharge status: Home / Self Care | Payer: Medicare Other | Source: Ambulatory Visit | Attending: Internal Medicine | Admitting: Internal Medicine

## 2014-12-20 ENCOUNTER — Other Ambulatory Visit: Payer: Self-pay

## 2014-12-20 ENCOUNTER — Ambulatory Visit (INDEPENDENT_AMBULATORY_CARE_PROVIDER_SITE_OTHER): Payer: Medicare Other | Admitting: Internal Medicine

## 2014-12-20 ENCOUNTER — Encounter: Payer: Self-pay | Admitting: Internal Medicine

## 2014-12-20 VITALS — BP 136/86 | HR 77 | Temp 97.4°F | Wt 232.0 lb

## 2014-12-20 DIAGNOSIS — Z853 Personal history of malignant neoplasm of breast: Secondary | ICD-10-CM

## 2014-12-20 DIAGNOSIS — E890 Postprocedural hypothyroidism: Secondary | ICD-10-CM

## 2014-12-20 DIAGNOSIS — Z8719 Personal history of other diseases of the digestive system: Secondary | ICD-10-CM

## 2014-12-20 DIAGNOSIS — I10 Essential (primary) hypertension: Secondary | ICD-10-CM

## 2014-12-20 DIAGNOSIS — Z01419 Encounter for gynecological examination (general) (routine) without abnormal findings: Secondary | ICD-10-CM | POA: Diagnosis not present

## 2014-12-20 DIAGNOSIS — E119 Type 2 diabetes mellitus without complications: Secondary | ICD-10-CM | POA: Diagnosis not present

## 2014-12-20 DIAGNOSIS — Z9889 Other specified postprocedural states: Secondary | ICD-10-CM

## 2014-12-20 DIAGNOSIS — Z8619 Personal history of other infectious and parasitic diseases: Secondary | ICD-10-CM

## 2014-12-20 DIAGNOSIS — Z9049 Acquired absence of other specified parts of digestive tract: Secondary | ICD-10-CM | POA: Diagnosis not present

## 2014-12-20 DIAGNOSIS — Z1231 Encounter for screening mammogram for malignant neoplasm of breast: Secondary | ICD-10-CM

## 2014-12-20 DIAGNOSIS — Z9009 Acquired absence of other part of head and neck: Secondary | ICD-10-CM

## 2014-12-20 LAB — TSH: TSH: 1.106 u[IU]/mL (ref 0.350–4.500)

## 2014-12-20 MED ORDER — ALPRAZOLAM 0.5 MG PO TABS: 0.5000 mg | ORAL_TABLET | Freq: Two times a day (BID) | ORAL | Status: DC | PRN

## 2014-12-20 NOTE — Telephone Encounter (Signed)
Script for xanax 0.5 mg was called to [facility] as requested by patient

## 2014-12-20 NOTE — Patient Instructions (Signed)
Continue same meds and return in 6 months. Reminded about eye exam and mammogram.

## 2014-12-20 NOTE — Progress Notes (Signed)
Subjective:    Patient ID: Victoria [name], female    DOB: 1941/04/22, 73 y.o.   MRN: [id number]  HPI  73 year old Black Female for health maintenance and evaluation of medical issues. She was treated for 12 weeks with Harvoni for Hepatitis C which she completed in [name] 2015. and has done well. Patient says diabetes is under good control. She is followed at the hepatology clinic. Apparently liver functions normalized and her viral load decreased to a nondetectable level. She has more energy and feels better. Dr. Gershon Crane does eye exams. Had bilateral cataract extractions February 2014. History of hypertension. Remote history of breast cancer. History of allergic rhinitis. History of trigger finger right third finger.  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 There was a reexcision in September 2000 showing a single microscopic focus of intraductal carcinoma. She did have radiation therapy. Subsequently she was lost to follow-up by oncology after 2005. 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 2 lymph nodes were negative for tumor. Tumor was estrogen receptor and progesterone receptor positive. She subsequently was referred to radiation oncology for further radiation the remaining right breast tissue. She had been on estrogen replacement since 1997 at the time of her diagnosis. There is no family history of breast cancer. Tamoxifen was recommended but she declined to take it.  History of abnormal Pap smear 2005  In April 2014 she had gallstone pancreatitis and underwent cholecystectomy by Dr. Lucia Gaskins. Liver biopsy revealed cirrhosis and that's when her hepatitis C was discovered.  History of partial left thyroidectomy at [facility] in [place] in [place]. Has never been on thyroid  replacement.  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 Does not smoke or consume alcohol.  Family history: Sr. died of colon cancer. Patient has 3 children.      Review of Systems  Constitutional: Negative.   Eyes: Negative.   Respiratory: Negative.   Cardiovascular: Negative.   Genitourinary: Negative.   Allergic/Immunologic: Positive for environmental allergies.  Psychiatric/Behavioral: Negative.        Objective:   Physical Exam  Constitutional: She is oriented to person, place, and time. She appears well-developed and well-nourished. No distress.  HENT:  Head: Normocephalic and atraumatic.  Right Ear: External ear normal.  Left Ear: External ear normal.  Mouth/Throat: Oropharynx is clear and moist. No oropharyngeal exudate.  Eyes: Conjunctivae and EOM are normal. Pupils are equal, round, and reactive to light. Right eye exhibits no discharge. Left eye exhibits no discharge. No scleral icterus.  Neck: Neck supple. No JVD present. No thyromegaly present.  Cardiovascular: Normal rate, regular rhythm, normal heart sounds and intact distal pulses.   No murmur heard. Pulmonary/Chest: Effort normal and breath sounds normal. No respiratory distress. She has no rales. She exhibits no tenderness.  Abdominal: Soft. Bowel sounds are normal. She exhibits no distension and no mass. There is no tenderness. There is no rebound and no guarding.  Genitourinary:  Pap taken. Bimanual normal.  Musculoskeletal: She exhibits no edema.  Lymphadenopathy:    She has no cervical adenopathy.  Neurological: She is alert and oriented to person, place, and time. She has normal reflexes. No cranial nerve deficit. Coordination normal.  Skin: Skin is warm and dry. No rash noted. She  is not diaphoretic.  Psychiatric: She has a normal mood and affect. Her behavior is normal. Judgment and thought content normal.  Vitals reviewed.         Assessment & Plan:    Controlled type 2 diabetes-hemoglobin A1c stable  Hypertension-stable on current regimen  Remote history of breast cancer  History of hepatitis C treated successfully with hard bony  History of partial left thyroidectomy-as never been on thyroid replacement-TSH is within normal limits  Plan: Continue diet and exercise efforts. Return in 6 months. Needs annual eye exam.  Subjective:   Patient presents for Medicare Annual/Subsequent preventive examination.  Review Past Medical/Family/Social: See above   Risk Factors  Current exercise habits: Walks some Dietary issues discussed: Low fat low carbohydrate  Cardiac risk factors: Diabetes mellitus, hypertension  Depression Screen  (Note: if answer to either of the following is "Yes", a more complete depression screening is indicated)   Over the past two weeks, have you felt down, depressed or hopeless? No  Over the past two weeks, have you felt little interest or pleasure in doing things? No Have you lost interest or pleasure in daily life? No Do you often feel hopeless? No Do you cry easily over simple problems? No   Activities of Daily Living  In your present state of health, do you have any difficulty performing the following activities?:   Driving? No  Managing money? No  Feeding yourself? No  Getting from bed to chair? No  Climbing a flight of stairs? No  Preparing food and eating?: No  Bathing or showering? No  Getting dressed: No  Getting to the toilet? No  Using the toilet:No  Moving around from place to place: No  In the past year have you fallen or had a near fall?:No  Are you sexually active? yes Do you have more than one partner? No   Hearing Difficulties: No  Do you often ask people to speak up or repeat themselves? No  Do you experience ringing or noises in your ears? No  Do you have difficulty understanding soft or whispered voices? No  Do you feel that you have a problem with memory? No Do you often  misplace items? No    Home Safety:  Do you have a smoke alarm at your residence? Yes Do you have grab bars in the bathroom? No Do you have throw rugs in your house? No   Cognitive Testing  Alert? Yes Normal Appearance?Yes  Oriented to person? Yes Place? Yes  Time? Yes  Recall of three objects? Yes  Can perform simple calculations? Yes  Displays appropriate judgment?Yes  Can read the correct time from a watch face?Yes   List the Names of Other Physician/Practitioners you currently use:  See referral list for the physicians patient is currently seeing.  Dr. Gershon Crane  Hepatologist   Review of Systems: See above   Objective:     General appearance: Appears stated age and mildly obese  Head: Normocephalic, without obvious abnormality, atraumatic  Eyes: conj clear, EOMi PEERLA  Ears: normal TM's and external ear canals both ears  Nose: Nares normal. Septum midline. Mucosa normal. No drainage or sinus tenderness.  Throat: lips, mucosa, and tongue normal; teeth and gums normal  Neck: no adenopathy, no carotid bruit, no JVD, supple, symmetrical, trachea midline and thyroid not enlarged, symmetric, no tenderness/mass/nodules  No CVA tenderness.  Lungs: clear to auscultation bilaterally  Breasts: normal appearance, no masses or tenderness is obese and bluish see above Heart:  regular rate and rhythm, S1, S2 normal, no murmur, click, rub or gallop  Abdomen: soft, non-tender; bowel sounds normal; no masses, no organomegaly  Musculoskeletal: ROM normal in all joints, no crepitus, no deformity, Normal muscle strengthen. Back  is symmetric, no curvature. Skin: Skin color, texture, turgor normal. No rashes or lesions  Lymph nodes: Cervical, supraclavicular, and axillary nodes normal.  Neurologic: CN 2 -12 Normal, Normal symmetric reflexes. Normal coordination and gait  Psych: Alert & Oriented x 3, Mood appear stable.    Assessment:    Annual wellness medicare exam   Plan:     During the course of the visit the patient was educated and counseled about appropriate screening and preventive services including:   Annual mammogram  Annual diabetic eye exam     Patient Instructions (the written plan) was given to the patient.  Medicare Attestation  I have personally reviewed:  The patient's medical and social history  Their use of alcohol, tobacco or illicit drugs  Their current medications and supplements  The patient's functional ability including ADLs,fall risks, home safety risks, cognitive, and hearing and visual impairment  Diet and physical activities  Evidence for depression or mood disorders  The patient's weight, height, BMI, and visual acuity have been recorded in the chart. I have made referrals, counseling, and provided education to the patient based on review of the above and I have provided the patient with a written personalized care plan for preventive services.

## 2014-12-22 LAB — CYTOLOGY - PAP

## 2014-12-26 ENCOUNTER — Ambulatory Visit
Admission: RE | Admit: 2014-12-26 | Discharge: 2014-12-26 | Disposition: A | Discharge status: Home / Self Care | Payer: Medicare Other | Source: Ambulatory Visit

## 2014-12-26 ENCOUNTER — Telehealth: Payer: Self-pay | Admitting: *Deleted

## 2014-12-26 DIAGNOSIS — Z1231 Encounter for screening mammogram for malignant neoplasm of breast: Secondary | ICD-10-CM | POA: Diagnosis not present

## 2014-12-26 DIAGNOSIS — Z9889 Other specified postprocedural states: Secondary | ICD-10-CM

## 2014-12-26 DIAGNOSIS — Z853 Personal history of malignant neoplasm of breast: Secondary | ICD-10-CM

## 2014-12-26 NOTE — Telephone Encounter (Signed)
Reviewed lab results with patient.

## 2014-12-28 ENCOUNTER — Other Ambulatory Visit: Payer: Self-pay | Admitting: Internal Medicine

## 2014-12-28 DIAGNOSIS — R928 Other abnormal and inconclusive findings on diagnostic imaging of breast: Secondary | ICD-10-CM

## 2015-01-05 ENCOUNTER — Ambulatory Visit
Admission: RE | Admit: 2015-01-05 | Discharge: 2015-01-05 | Disposition: A | Discharge status: Home / Self Care | Payer: Medicare Other | Source: Ambulatory Visit | Attending: Internal Medicine | Admitting: Internal Medicine

## 2015-01-05 DIAGNOSIS — R928 Other abnormal and inconclusive findings on diagnostic imaging of breast: Secondary | ICD-10-CM

## 2015-01-05 DIAGNOSIS — R921 Mammographic calcification found on diagnostic imaging of breast: Secondary | ICD-10-CM | POA: Diagnosis not present

## 2015-01-10 DIAGNOSIS — B182 Chronic viral hepatitis C: Secondary | ICD-10-CM | POA: Diagnosis not present

## 2015-01-16 ENCOUNTER — Other Ambulatory Visit: Payer: Self-pay | Admitting: Nurse Practitioner

## 2015-01-16 DIAGNOSIS — B182 Chronic viral hepatitis C: Secondary | ICD-10-CM | POA: Diagnosis not present

## 2015-01-16 DIAGNOSIS — K7469 Other cirrhosis of liver: Secondary | ICD-10-CM | POA: Diagnosis not present

## 2015-01-16 DIAGNOSIS — C220 Liver cell carcinoma: Secondary | ICD-10-CM

## 2015-01-23 ENCOUNTER — Other Ambulatory Visit: Payer: Self-pay | Admitting: Internal Medicine

## 2015-02-09 ENCOUNTER — Other Ambulatory Visit: Payer: Self-pay | Admitting: *Deleted

## 2015-02-09 MED ORDER — AMLODIPINE BESYLATE 2.5 MG PO TABS: 2.5000 mg | ORAL_TABLET | Freq: Every day | ORAL | Status: DC

## 2015-02-09 NOTE — Telephone Encounter (Signed)
Refills on amlodipine sent to patient pharmacy

## 2015-05-08 ENCOUNTER — Ambulatory Visit
Admission: RE | Admit: 2015-05-08 | Discharge: 2015-05-08 | Disposition: A | Discharge status: Home / Self Care | Payer: Medicare Other | Source: Ambulatory Visit | Attending: Nurse Practitioner | Admitting: Nurse Practitioner

## 2015-05-08 DIAGNOSIS — C220 Liver cell carcinoma: Secondary | ICD-10-CM

## 2015-05-29 ENCOUNTER — Other Ambulatory Visit: Payer: Self-pay | Admitting: *Deleted

## 2015-05-29 MED ORDER — ALBUTEROL SULFATE HFA 108 (90 BASE) MCG/ACT IN AERS: 2.0000 | INHALATION_SPRAY | Freq: Four times a day (QID) | RESPIRATORY_TRACT | Status: DC | PRN

## 2015-05-29 NOTE — Telephone Encounter (Signed)
Left message for patient to call for appt before anymore refills on medications

## 2015-06-19 ENCOUNTER — Other Ambulatory Visit (INDEPENDENT_AMBULATORY_CARE_PROVIDER_SITE_OTHER): Payer: Medicare Other | Admitting: Internal Medicine

## 2015-06-19 ENCOUNTER — Other Ambulatory Visit: Payer: Self-pay

## 2015-06-19 DIAGNOSIS — E119 Type 2 diabetes mellitus without complications: Secondary | ICD-10-CM

## 2015-06-19 LAB — HEMOGLOBIN A1C
Hgb A1c MFr Bld: 6.8 % — ABNORMAL HIGH (ref <5.7)
Mean Plasma Glucose: 148 mg/dL — ABNORMAL HIGH (ref <117)

## 2015-06-20 ENCOUNTER — Ambulatory Visit (INDEPENDENT_AMBULATORY_CARE_PROVIDER_SITE_OTHER): Payer: Medicare Other | Admitting: Internal Medicine

## 2015-06-20 ENCOUNTER — Encounter: Payer: Self-pay | Admitting: Internal Medicine

## 2015-06-20 VITALS — BP 134/80 | HR 77 | Temp 98.1°F | Resp 12 | Wt 247.0 lb

## 2015-06-20 DIAGNOSIS — B1920 Unspecified viral hepatitis C without hepatic coma: Secondary | ICD-10-CM | POA: Diagnosis not present

## 2015-06-20 DIAGNOSIS — E119 Type 2 diabetes mellitus without complications: Secondary | ICD-10-CM | POA: Diagnosis not present

## 2015-06-20 DIAGNOSIS — J069 Acute upper respiratory infection, unspecified: Secondary | ICD-10-CM

## 2015-06-20 DIAGNOSIS — I10 Essential (primary) hypertension: Secondary | ICD-10-CM | POA: Diagnosis not present

## 2015-06-20 DIAGNOSIS — C50911 Malignant neoplasm of unspecified site of right female breast: Secondary | ICD-10-CM

## 2015-06-20 MED ORDER — FLUTICASONE PROPIONATE 50 MCG/ACT NA SUSP: NASAL | Status: DC

## 2015-06-20 MED ORDER — METHYLPREDNISOLONE ACETATE 80 MG/ML IJ SUSP
80.0000 mg | Freq: Once | INTRAMUSCULAR | Status: AC
  Administered 2015-06-20: 80 mg via INTRAMUSCULAR

## 2015-06-20 MED ORDER — AMOXICILLIN 500 MG PO CAPS: 500.0000 mg | ORAL_CAPSULE | Freq: Three times a day (TID) | ORAL | Status: DC

## 2015-06-20 NOTE — Patient Instructions (Signed)
Depo-Medrol given today in office. Take amoxicillin 500 mg 3 times daily for 10 days. Please try to watch diet and exercise. Return in 6 months for physical examination. Have diabetic eye exam. Continue same antihypertensives medications.

## 2015-06-20 NOTE — Progress Notes (Signed)
   Subjective:    Patient ID: Victoria [name], female    DOB: [birth year], 74 y.o.   MRN: [id number]  HPI  74 year old 55 female with history of hepatitis C status post treatment with Harvoni in today to follow-up on type 2 diabetes mellitus. Says she's not been exercising as much recently and not been following a strict diabetic diet. As a result her hemoglobin A1c has increased from 6.4% in December to 6.8%.  She is complaining of respiratory congestion. Finding it difficult to breathe due to nasal congestion. Some cough. No fever or shaking chills. Thinks it's allergic rhinitis but more likely gets an acute respiratory infection. No significant sputum production. Cough sounds a bit croupy. She sounds nasally congested.  She has a history of essential hypertension as well.  Remote history of breast cancer.    Review of Systems     Objective:   Physical Exam Skin warm and dry. Nodes none. IV nasal mucosa. TMs are slightly full bilaterally. Pharynx is clear. Neck is supple without JVD thyromegaly or carotid bruits. Chest clear to auscultation. Cardiac exam regular rate and rhythm normal S1 and S2. Extremities without edema.       Assessment & Plan:  Controlled type 2 diabetes mellitus needs to be stricter about diet and exercise. Recheck at time of CPE in 6 months-  Breast cancer  Hepatitis C status post treatment with Harvoni  Essential hypertension-stable on treatment  Acute URI-see below  Plan: Have refilled Flonase nasal spray and Ventolin inhaler at her request. Amoxicillin 500 mg 3 times daily for 10 days. Depo-Medrol 80 mg IM given today to help shrink nasal mucosa and give her some relief with nasal congestion. She is to return in 6 months for physical examination.

## 2015-11-02 ENCOUNTER — Encounter: Payer: Self-pay | Admitting: Internal Medicine

## 2015-11-02 ENCOUNTER — Ambulatory Visit (INDEPENDENT_AMBULATORY_CARE_PROVIDER_SITE_OTHER): Payer: Medicare Other | Admitting: Internal Medicine

## 2015-11-02 ENCOUNTER — Telehealth: Payer: Self-pay | Admitting: Internal Medicine

## 2015-11-02 VITALS — BP 104/86 | HR 85 | Temp 97.1°F | Resp 18 | Ht 69.0 in | Wt 245.0 lb

## 2015-11-02 DIAGNOSIS — E119 Type 2 diabetes mellitus without complications: Secondary | ICD-10-CM

## 2015-11-02 DIAGNOSIS — R05 Cough: Secondary | ICD-10-CM | POA: Diagnosis not present

## 2015-11-02 DIAGNOSIS — R531 Weakness: Secondary | ICD-10-CM | POA: Diagnosis not present

## 2015-11-02 DIAGNOSIS — R059 Cough, unspecified: Secondary | ICD-10-CM

## 2015-11-02 LAB — CBC WITH DIFFERENTIAL/PLATELET
Basophils Absolute: 0 10*3/uL (ref 0.0–0.1)
Basophils Relative: 0 % (ref 0–1)
Eosinophils Absolute: 0 10*3/uL (ref 0.0–0.7)
Eosinophils Relative: 0 % (ref 0–5)
HCT: 40.8 % (ref 36.0–46.0)
Hemoglobin: 13.3 g/dL (ref 12.0–15.0)
Lymphocytes Relative: 34 % (ref 12–46)
Lymphs Abs: 3.2 10*3/uL (ref 0.7–4.0)
MCH: 27.3 pg (ref 26.0–34.0)
MCHC: 32.6 g/dL (ref 30.0–36.0)
MCV: 83.8 fL (ref 78.0–100.0)
MPV: 9.3 fL (ref 8.6–12.4)
Monocytes Absolute: 0.5 10*3/uL (ref 0.1–1.0)
Monocytes Relative: 5 % (ref 3–12)
Neutro Abs: 5.7 10*3/uL (ref 1.7–7.7)
Neutrophils Relative %: 61 % (ref 43–77)
Platelets: 169 10*3/uL (ref 150–400)
RBC: 4.87 MIL/uL (ref 3.87–5.11)
RDW: 16.4 % — ABNORMAL HIGH (ref 11.5–15.5)
WBC: 9.4 10*3/uL (ref 4.0–10.5)

## 2015-11-02 LAB — COMPLETE METABOLIC PANEL WITH GFR
ALT: 12 U/L (ref 6–29)
AST: 17 U/L (ref 10–35)
Albumin: 4.3 g/dL (ref 3.6–5.1)
Alkaline Phosphatase: 80 U/L (ref 33–130)
BUN: 16 mg/dL (ref 7–25)
CO2: 28 mmol/L (ref 20–31)
Calcium: 9.8 mg/dL (ref 8.6–10.4)
Chloride: 102 mmol/L (ref 98–110)
Creat: 0.72 mg/dL (ref 0.60–0.93)
GFR, Est African American: >89 mL/min (ref >=60)
GFR, Est Non African American: 84 mL/min (ref >=60)
Glucose, Bld: 141 mg/dL — ABNORMAL HIGH (ref 65–99)
Potassium: 4.3 mmol/L (ref 3.5–5.3)
Sodium: 139 mmol/L (ref 135–146)
Total Bilirubin: 0.4 mg/dL (ref 0.2–1.2)
Total Protein: 7.9 g/dL (ref 6.1–8.1)

## 2015-11-02 LAB — POCT URINALYSIS DIPSTICK
Bilirubin, UA: NEGATIVE
Blood, UA: NEGATIVE
Glucose, UA: NEGATIVE
Ketones, UA: NEGATIVE
Leukocytes, UA: NEGATIVE
Nitrite, UA: NEGATIVE
Protein, UA: NEGATIVE
Spec Grav, UA: 1.015
Urobilinogen, UA: 0.2
pH, UA: 5

## 2015-11-02 LAB — HEMOGLOBIN A1C
Hgb A1c MFr Bld: 6.5 % — ABNORMAL HIGH (ref <5.7)
Mean Plasma Glucose: 140 mg/dL — ABNORMAL HIGH (ref <117)

## 2015-11-02 LAB — GLUCOSE, POCT (MANUAL RESULT ENTRY): POC Glucose: 151 mg/dl — AB (ref 70–99)

## 2015-11-02 NOTE — Progress Notes (Signed)
   Subjective:    Patient ID: Victoria [name], female    DOB: 07/26/1941, 74 y.o.   MRN: [id number]  HPI  74-year-old Black Female called today complaining of malaise fatigue and shakiness, slight cough and nausea. Says she wants to be admitted to the hospital. She is not been checking her Accu-Cheks on a regular basis. Says that she does not have a meter. Says the strips are expensive. I have written prescription today for home glucose meter and strips. Accu-Chek in the office is 151. Her urine shows no evidence of glucose or ketones. No evidence of infection. Apparently she is not taking metformin regularly. She says she got up in the middle of the night feeling weak and shaky and felt the need to eat. Explained to her that she needed to have a home Accu-Chek machine to check her Accu-Cheks on a regular basis so we knew what was going on. When she was here in June her hemoglobin A1c was 6.8%. She has a history of hepatitis C and is status post treatment with Harvoni. She had a mammogram in January.    Review of Systems     Objective:   Physical Exam  Skin is warm and dry. Nodes none. Pharynx and TMs are clear. Neck is supple without JVD thyromegaly or carotid bruits. Chest is clear to auscultation. Cardiac exam regular rate and rhythm. Extremities without edema.      Assessment & Plan:  Malaise and fatigue -etiology unclear  Controlled type 2 diabetes mellitus-hemoglobin A1c drawn and pending  History of hepatitis C  Remote history of breast cancer  Plan: Have drawn CBC C met and hemoglobin A1c. Urine sent for microalbumin. Have asked patient to go get chest x-ray since she's complaining of cough to rule out occult pneumonia or congestive heart failure. Have asked patient to purchase home Accu-Chek machine and take Accu-Cheks on a regular basis. 

## 2015-11-02 NOTE — Telephone Encounter (Signed)
Spoke with patient to inquire as to chest x-ray.  States she didn't feel like going when she left the office; states she just couldn't go then.  Said she did stop off and got herself something to eat and she does feel better now than she did when she was here earlier today.  States she will go and get the chest x-ray tomorrow.  Advised that we will be looking for the chest x-ray results tomorrow, please make sure and go tomorrow for the chest x-ray so that we can be sure that all is well with her.    Patient verbalized understanding of these instructions.

## 2015-11-02 NOTE — Patient Instructions (Signed)
Please go have chest x-ray. Lab work drawn and pending. Get Accu-Chek machine and take Accu-Cheks on a regular basis.

## 2015-11-03 ENCOUNTER — Ambulatory Visit
Admission: RE | Admit: 2015-11-03 | Discharge: 2015-11-03 | Disposition: A | Discharge status: Home / Self Care | Payer: Medicare Other | Source: Ambulatory Visit | Attending: Internal Medicine | Admitting: Internal Medicine

## 2015-11-03 DIAGNOSIS — R05 Cough: Secondary | ICD-10-CM | POA: Diagnosis not present

## 2015-11-03 DIAGNOSIS — E119 Type 2 diabetes mellitus without complications: Secondary | ICD-10-CM

## 2015-11-03 LAB — MICROALBUMIN, URINE: Microalb, Ur: 2.5 mg/dL

## 2015-12-14 DIAGNOSIS — Z961 Presence of intraocular lens: Secondary | ICD-10-CM | POA: Diagnosis not present

## 2015-12-14 DIAGNOSIS — E119 Type 2 diabetes mellitus without complications: Secondary | ICD-10-CM | POA: Diagnosis not present

## 2015-12-14 DIAGNOSIS — H04123 Dry eye syndrome of bilateral lacrimal glands: Secondary | ICD-10-CM | POA: Diagnosis not present

## 2015-12-27 ENCOUNTER — Other Ambulatory Visit: Payer: Self-pay | Admitting: Internal Medicine

## 2015-12-27 NOTE — Telephone Encounter (Signed)
Per Dr. Renold Genta

## 2016-01-12 ENCOUNTER — Telehealth: Payer: Self-pay | Admitting: Internal Medicine

## 2016-01-12 ENCOUNTER — Encounter: Payer: Self-pay | Admitting: Internal Medicine

## 2016-01-12 ENCOUNTER — Ambulatory Visit (INDEPENDENT_AMBULATORY_CARE_PROVIDER_SITE_OTHER): Payer: Medicare Other | Admitting: Internal Medicine

## 2016-01-12 VITALS — BP 140/88 | HR 94 | Temp 98.9°F | Resp 20 | Ht 69.0 in | Wt 235.5 lb

## 2016-01-12 DIAGNOSIS — J069 Acute upper respiratory infection, unspecified: Secondary | ICD-10-CM | POA: Diagnosis not present

## 2016-01-12 DIAGNOSIS — E119 Type 2 diabetes mellitus without complications: Secondary | ICD-10-CM | POA: Diagnosis not present

## 2016-01-12 MED ORDER — AMOXICILLIN 500 MG PO CAPS: 500.0000 mg | ORAL_CAPSULE | Freq: Three times a day (TID) | ORAL | Status: DC

## 2016-01-12 NOTE — Progress Notes (Signed)
   Subjective:    Patient ID: Janann August, female    DOB: Dec 22, 1941, 75 y.o.   MRN: [id number]  HPI Patient was here in November thinking that her glucose was not well controlled but it turns out it was pre-well-controlled after all. She says Accu-Cheks continued to run around 1:15 most of the time except when she ate candy and it was 180. She's here today with respiratory infection. Has maxillary sinus tenderness and congestion for several days. Not getting better.    Review of Systems     Objective:   Physical Exam  Skin warm and dry. Nodes none. TMs are clear. Pharynx is clear. Neck is supple. She sounds nasally congested. Chest clear to auscultation without rales or wheezing.      Assessment & Plan:  Acute URI  Controlled type 2 diabetes mellitus without complication  Plan: Amoxicillin 500 mg 3 times daily for 10 days with one refill. If not better in 10 days have prescription refilled.

## 2016-01-12 NOTE — Telephone Encounter (Signed)
Patient called wanting a refill of Amoxicillin; states that this is an ongoing thing.  States that she has been trying to treat her sinuses with home remedies.  When asked when Amoxicillin was last filled, patient stated probably a year ago.  Advised patient she would need to be seen for this.  Appointment given for today at 3:45.

## 2016-01-12 NOTE — Patient Instructions (Signed)
Amoxicillin 500 mg 3 times daily for 10 days with one refill.

## 2016-01-15 ENCOUNTER — Encounter: Payer: Self-pay | Admitting: Internal Medicine

## 2016-01-15 ENCOUNTER — Ambulatory Visit (INDEPENDENT_AMBULATORY_CARE_PROVIDER_SITE_OTHER): Payer: Medicare Other | Admitting: Internal Medicine

## 2016-01-15 VITALS — BP 142/82 | HR 72 | Wt 236.0 lb

## 2016-01-15 DIAGNOSIS — J069 Acute upper respiratory infection, unspecified: Secondary | ICD-10-CM

## 2016-01-15 MED ORDER — METHYLPREDNISOLONE ACETATE 80 MG/ML IJ SUSP
80.0000 mg | Freq: Once | INTRAMUSCULAR | Status: AC
  Administered 2016-01-15: 80 mg via INTRAMUSCULAR

## 2016-01-15 NOTE — Patient Instructions (Signed)
Depo-Medrol 80 mg IM given today.

## 2016-01-15 NOTE — Progress Notes (Signed)
   Subjective:    Patient ID: Victoria [name], female    DOB: January 17, 1941, 75 y.o.   MRN: [id number]  HPI She was here recently complaining of sinusitis symptoms and allergy symptoms. Offered to give her injection of Depo-Medrol but she declined. She is seeing Dr. Gershon Crane who told her she had allergy issues with her eyes and now she would like injection of Depo-Medrol.     Review of Systems     Objective:   Physical Exam   Not seen by physician. I did speak with her briefly and  she told me about Dr. Kellie Moor evaluation. Given 80 mg IM Depo-Medrol today     Assessment & Plan:  Sinusitis  Allergic rhinitis  Plan: Given 80 mg IM Depo-Medrol here in office today by CMA

## 2016-02-24 ENCOUNTER — Other Ambulatory Visit: Payer: Self-pay | Admitting: Internal Medicine

## 2016-03-01 ENCOUNTER — Encounter: Payer: Self-pay | Admitting: *Deleted

## 2016-03-27 ENCOUNTER — Other Ambulatory Visit: Payer: Self-pay | Admitting: Internal Medicine

## 2016-04-10 ENCOUNTER — Other Ambulatory Visit: Payer: Self-pay

## 2016-04-10 MED ORDER — ALPRAZOLAM 0.5 MG PO TABS: 0.5000 mg | ORAL_TABLET | Freq: Two times a day (BID) | ORAL | Status: DC | PRN

## 2016-04-10 NOTE — Telephone Encounter (Signed)
Refill once. Book CPE mid June

## 2016-04-10 NOTE — Telephone Encounter (Signed)
Last filled in Dec 2015

## 2016-04-10 NOTE — Telephone Encounter (Signed)
Phoned to pharmacy 

## 2016-06-20 ENCOUNTER — Telehealth: Payer: Self-pay | Admitting: Internal Medicine

## 2016-06-20 NOTE — Telephone Encounter (Signed)
Pt needs to go to Urgent Care. She is well aware we do not call in Rx for her.

## 2016-06-20 NOTE — Telephone Encounter (Signed)
Patient notified

## 2016-06-20 NOTE — Telephone Encounter (Signed)
Patient calls asking for a Rx for Amoxicillin for a sinus infection.  Advised patient that she was last prescribed Rx for this in 12/2015.  Patient states that Dr. Renold Genta says she gets these 2-3 times per year and she usually will give her an antibiotic.  Advised patient that it has been since January since she was last seen and prescribed this prescription.  She is INSISTENT that I get a message to you that she "needs" this Rx and that she is sure that you will provide it to her.  Advised that she needs to be seen in the office.   States that she has been getting this current sinus infection for 1-2 weeks.  Has bloody sinus drainage and aching in her sinus cavity.    Pharmacy:  [facility] @ [address]  She was last seen 01/15/16 and was given a shot here in the office of Depo Medrol.  She DOES have a CPE scheduled for 8/28.  Please advise.    Urgent Care??

## 2016-07-10 ENCOUNTER — Other Ambulatory Visit: Payer: Self-pay | Admitting: Internal Medicine

## 2016-07-10 NOTE — Telephone Encounter (Signed)
Verbal order by Dr. Renold Genta to refill Fluticasone 81mcg spray.  Dispense and refill for 1 year.  Left voicemail at [facility] @ [phone].

## 2016-07-17 ENCOUNTER — Other Ambulatory Visit: Payer: Self-pay | Admitting: Internal Medicine

## 2016-07-28 ENCOUNTER — Other Ambulatory Visit: Payer: Self-pay | Admitting: Internal Medicine

## 2016-07-29 ENCOUNTER — Other Ambulatory Visit: Payer: Self-pay | Admitting: Internal Medicine

## 2016-08-05 ENCOUNTER — Other Ambulatory Visit: Payer: Medicare Other | Admitting: Internal Medicine

## 2016-08-05 DIAGNOSIS — B182 Chronic viral hepatitis C: Secondary | ICD-10-CM

## 2016-08-05 DIAGNOSIS — I10 Essential (primary) hypertension: Secondary | ICD-10-CM | POA: Diagnosis not present

## 2016-08-05 DIAGNOSIS — F419 Anxiety disorder, unspecified: Secondary | ICD-10-CM | POA: Diagnosis not present

## 2016-08-05 DIAGNOSIS — E119 Type 2 diabetes mellitus without complications: Secondary | ICD-10-CM

## 2016-08-05 LAB — COMPLETE METABOLIC PANEL WITH GFR
ALT: 7 U/L (ref 6–29)
AST: 14 U/L (ref 10–35)
Albumin: 3.9 g/dL (ref 3.6–5.1)
Alkaline Phosphatase: 61 U/L (ref 33–130)
BUN: 19 mg/dL (ref 7–25)
CO2: 25 mmol/L (ref 20–31)
Calcium: 9 mg/dL (ref 8.6–10.4)
Chloride: 107 mmol/L (ref 98–110)
Creat: 0.93 mg/dL (ref 0.60–0.93)
GFR, Est African American: 71 mL/min (ref >=60)
GFR, Est Non African American: 61 mL/min (ref >=60)
Glucose, Bld: 107 mg/dL — ABNORMAL HIGH (ref 65–99)
Potassium: 4.8 mmol/L (ref 3.5–5.3)
Sodium: 144 mmol/L (ref 135–146)
Total Bilirubin: 0.5 mg/dL (ref 0.2–1.2)
Total Protein: 7 g/dL (ref 6.1–8.1)

## 2016-08-05 LAB — CBC WITH DIFFERENTIAL/PLATELET
Basophils Absolute: 0 cells/uL (ref 0–200)
Basophils Relative: 0 %
Eosinophils Absolute: 146 cells/uL (ref 15–500)
Eosinophils Relative: 2 %
HCT: 37.6 % (ref 35.0–45.0)
Hemoglobin: 12.2 g/dL (ref 11.7–15.5)
Lymphs Abs: 3504 cells/uL (ref 850–3900)
MCH: 27.3 pg (ref 27.0–33.0)
MCHC: 32.4 g/dL (ref 32.0–36.0)
MCV: 84.1 fL (ref 80.0–100.0)
MPV: 9.2 fL (ref 7.5–12.5)
Monocytes Absolute: 365 cells/uL (ref 200–950)
Monocytes Relative: 5 %
Neutro Abs: 3285 cells/uL (ref 1500–7800)
Neutrophils Relative %: 45 %
Platelets: 178 10*3/uL (ref 140–400)
RBC: 4.47 MIL/uL (ref 3.80–5.10)
RDW: 15.6 % — ABNORMAL HIGH (ref 11.0–15.0)
WBC: 7.3 10*3/uL (ref 3.8–10.8)

## 2016-08-05 LAB — LIPID PANEL
Cholesterol: 153 mg/dL (ref 125–200)
HDL: 47 mg/dL (ref >=46)
LDL Cholesterol: 92 mg/dL (ref <130)
Total CHOL/HDL Ratio: 3.3 Ratio (ref <=5.0)
Triglycerides: 68 mg/dL (ref <150)
VLDL: 14 mg/dL (ref <30)

## 2016-08-05 LAB — TSH: TSH: 0.46 mIU/L

## 2016-08-06 LAB — MICROALBUMIN / CREATININE URINE RATIO
Creatinine, Urine: 263 mg/dL (ref 20–320)
Microalb Creat Ratio: 8 mcg/mg creat (ref <30)
Microalb, Ur: 2.2 mg/dL

## 2016-08-06 LAB — HEMOGLOBIN A1C
Hgb A1c MFr Bld: 6.2 % — ABNORMAL HIGH (ref <5.7)
Mean Plasma Glucose: 131 mg/dL

## 2016-08-06 LAB — VITAMIN D 25 HYDROXY (VIT D DEFICIENCY, FRACTURES): Vit D, 25-Hydroxy: 28 ng/mL — ABNORMAL LOW (ref 30–100)

## 2016-08-19 ENCOUNTER — Encounter: Payer: Self-pay | Admitting: Internal Medicine

## 2016-08-19 ENCOUNTER — Ambulatory Visit (INDEPENDENT_AMBULATORY_CARE_PROVIDER_SITE_OTHER): Payer: Medicare Other | Admitting: Internal Medicine

## 2016-08-19 ENCOUNTER — Other Ambulatory Visit: Payer: Self-pay | Admitting: Specialist

## 2016-08-19 VITALS — BP 154/98 | HR 79 | Temp 97.5°F | Ht 69.0 in | Wt 227.0 lb

## 2016-08-19 DIAGNOSIS — E669 Obesity, unspecified: Secondary | ICD-10-CM | POA: Diagnosis not present

## 2016-08-19 DIAGNOSIS — Z853 Personal history of malignant neoplasm of breast: Secondary | ICD-10-CM | POA: Diagnosis not present

## 2016-08-19 DIAGNOSIS — E119 Type 2 diabetes mellitus without complications: Secondary | ICD-10-CM

## 2016-08-19 DIAGNOSIS — Z0000 Encounter for general adult medical examination without abnormal findings: Secondary | ICD-10-CM | POA: Diagnosis not present

## 2016-08-19 DIAGNOSIS — Z9009 Acquired absence of other part of head and neck: Secondary | ICD-10-CM

## 2016-08-19 DIAGNOSIS — I10 Essential (primary) hypertension: Secondary | ICD-10-CM

## 2016-08-19 DIAGNOSIS — Z8719 Personal history of other diseases of the digestive system: Secondary | ICD-10-CM | POA: Diagnosis not present

## 2016-08-19 DIAGNOSIS — Z8619 Personal history of other infectious and parasitic diseases: Secondary | ICD-10-CM

## 2016-08-19 DIAGNOSIS — E890 Postprocedural hypothyroidism: Secondary | ICD-10-CM | POA: Diagnosis not present

## 2016-08-19 LAB — POCT URINALYSIS DIPSTICK
Bilirubin, UA: NEGATIVE
Blood, UA: NEGATIVE
Glucose, UA: NEGATIVE
Ketones, UA: NEGATIVE
Leukocytes, UA: NEGATIVE
Nitrite, UA: NEGATIVE
Protein, UA: NEGATIVE
Spec Grav, UA: 1.02
Urobilinogen, UA: 0.2
pH, UA: 5

## 2016-08-21 NOTE — Progress Notes (Signed)
Subjective:    Patient ID: Victoria [name], female    DOB: 14-Nov-1941, 75 y.o.   MRN: [id number]  HPI 75 year old Black Female in today for health maintenance exam and evaluation of medical problems.  She has a history of diabetes mellitus treated with oral medications. She has a history of hepatitis C for which she received 12 weeks of Harvoni which she completed in [name] 2015 and has done well.  Dr. Gershon Crane does her annual eye exams.  History of bilateral cataract extractions February 2014. History of hypertension. Remote history of breast cancer. History of allergic rhinitis. History of trigger finger right third finger.  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 There was a reexcision in September 2000 showing a single microscopic focus of intraductal carcinoma. She did have radiation therapy. Subsequently she was lost to follow-up by oncology after 2005. 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 2 lymph nodes were negative for tumor. Tumor was estrogen receptor and progesterone receptor positive. She was referred to radiation oncology for further radiation of the remaining right breast tissue. She had been on estrogen replacement since 1997 at the time of her diagnosis. There is no family history of breast cancer. Tamoxifen was recommended but she declined to take it.  History of abnormal Pap smear 2005.  In April 2014 she had gallstone pancreatitis and underwent cholecystectomy by Dr. Lucia Gaskins. Liver biopsy revealed cirrhosis and that Swenor hepatitis C was discovered.  History of partial left thyroidectomy at [facility] in [facility]. Has never been on thyroid replacement. TSH has always been within normal limits.  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 Does not smoke or consume alcohol.  Family history: Sister died of colon cancer. Patient has 3 children    Review of Systems no new complaints     Objective:   Physical Exam  Constitutional: She is oriented to person, place, and time. She appears well-developed and well-nourished. No distress.  HENT:  Head: Normocephalic and atraumatic.  Right Ear: External ear normal.  Left Ear: External ear normal.  Mouth/Throat: Oropharynx is clear and moist. No oropharyngeal exudate.  Eyes: Conjunctivae and EOM are normal. Pupils are equal, round, and reactive to light. Right eye exhibits no discharge. Left eye exhibits no discharge.  Neck: Neck supple. No JVD present. No thyromegaly present.  Cardiovascular: Normal rate, regular rhythm, normal heart sounds and intact distal pulses.   No murmur heard. Pulmonary/Chest: Effort normal and breath sounds normal. No respiratory distress. She has no wheezes. She has no rales.  Status post breast reduction surgery. No masses appreciated. Mammogram recommended.  Abdominal: Soft. Bowel sounds are normal. She exhibits no distension and no mass. There is no tenderness. There is no rebound and no guarding.  Genitourinary:  Genitourinary Comments: Pap taken in 2015. Bimanual was normal at that time.  Bimanual normal today.  Musculoskeletal: She exhibits no edema.  Lymphadenopathy:    She has no cervical adenopathy.  Neurological: She is alert and oriented to person, place, and time. She has normal reflexes. No cranial nerve deficit. Coordination normal.  Skin: Skin is warm and dry. No rash noted. She is not diaphoretic.  Psychiatric: She has a normal mood and affect. Her behavior is normal. Judgment and  thought content normal.  Vitals reviewed.         Assessment & Plan:  History of hepatitis C status post are bony treatment in remission  Diabetes mellitus controlled with diet and exercise and oral medication.  Hemoglobin A1c 6.2% and previously was 6.5% 9 months ago  Hypertension-stable on current regimen  Remote history of breast cancer-recommend annual mammogram  History of partial left thyroidectomy and has never been on thyroid replacement. TSH is always been within normal limits. TSH is 0.46  Obesity  Vitamin D deficiency-recommend 2000 units vitamin D 3 daily  Plan: Continue same medications and return in 6 months. Reminded about annual diabetic eye exam and annual flu vaccine.  Subjective:   Patient presents for Medicare Annual/Subsequent preventive examination.  Review Past Medical/Family/Social: see above   Risk Factors  Current exercise habits: walking Dietary issues discussed: low fat low carb  Cardiac risk factors: DM  Depression Screen  (Note: if answer to either of the following is "Yes", a more complete depression screening is indicated)   Over the past two weeks, have you felt down, depressed or hopeless? No  Over the past two weeks, have you felt little interest or pleasure in doing things? No Have you lost interest or pleasure in daily life? No Do you often feel hopeless? No Do you cry easily over simple problems? No   Activities of Daily Living  In your present state of health, do you have any difficulty performing the following activities?:   Driving? No  Managing money? No  Feeding yourself? No  Getting from bed to chair? No  Climbing a flight of stairs? No  Preparing food and eating?: No  Bathing or showering? No  Getting dressed: No  Getting to the toilet? No  Using the toilet:No  Moving around from place to place: No  In the past year have you fallen or had a near fall?:No  Are you sexually active? yes Do you have more than one partner? No   Hearing Difficulties: No  Do you often ask people to speak up or repeat themselves? No  Do you experience ringing or noises in your ears? No  Do you have difficulty understanding soft or whispered voices?  No  Do you feel that you have a problem with memory? No Do you often misplace items? No    Home Safety:  Do you have a smoke alarm at your residence? Yes Do you have grab bars in the bathroom? no Do you have throw rugs in your house? no   Cognitive Testing  Alert? Yes Normal Appearance?Yes  Oriented to person? Yes Place? Yes  Time? Yes  Recall of three objects? Yes  Can perform simple calculations? Yes  Displays appropriate judgment?Yes  Can read the correct time from a watch face?Yes   List the Names of Other Physician/Practitioners you currently use:  See referral list for the physicians patient is currently seeing.     Review of Systems:   Objective:     General appearance: Appears stated age and  obese  Head: Normocephalic, without obvious abnormality, atraumatic  Eyes: conj clear, EOMi PEERLA  Ears: normal TM's and external ear canals both ears  Nose: Nares normal. Septum midline. Mucosa normal. No drainage or sinus tenderness.  Throat: lips, mucosa, and tongue normal; teeth and gums normal  Neck: no adenopathy, no carotid bruit, no JVD, supple, symmetrical, trachea midline and thyroid not enlarged, symmetric, no tenderness/mass/nodules  No CVA tenderness.  Lungs: clear to auscultation  bilaterally  Breasts: normal appearance, no masses or tenderness.  It is tender.  Heart: regular rate and rhythm, S1, S2 normal, no murmur, click, rub or gallop  Abdomen: soft, non-tender; bowel sounds normal; no masses, no organomegaly  Musculoskeletal: ROM normal in all joints, no crepitus, no deformity, Normal muscle strengthen. Back  is symmetric, no curvature. Skin: Skin color, texture, turgor normal. No rashes or lesions  Lymph nodes: Cervical, supraclavicular, and axillary nodes normal.  Neurologic: CN 2 -12 Normal, Normal symmetric reflexes. Normal coordination and gait  Psych: Alert & Oriented x 3, Mood appear stable.    Assessment:    Annual wellness medicare exam    Plan:    During the course of the visit the patient was educated and counseled about appropriate screening and preventive services including:  Annual mammogram  Annual flu vaccine  Follow-up on pneumonia vaccines  Diabetic eye exam      Patient Instructions (the written plan) was given to the patient.  Medicare Attestation  I have personally reviewed:  The patient's medical and social history  Their use of alcohol, tobacco or illicit drugs  Their current medications and supplements  The patient's functional ability including ADLs,fall risks, home safety risks, cognitive, and hearing and visual impairment  Diet and physical activities  Evidence for depression or mood disorders  The patient's weight, height, BMI, and visual acuity have been recorded in the chart. I have made referrals, counseling, and provided education to the patient based on review of the above and I have provided the patient with a written personalized care plan for preventive services.

## 2016-08-21 NOTE — Patient Instructions (Addendum)
*  Taking amlodipine in the morning instead of bedtime. Repeat blood pressure is 140/90. Return on September 21 for blood pressure check. Continue diet and exercise efforts.

## 2016-08-22 ENCOUNTER — Other Ambulatory Visit: Payer: Self-pay | Admitting: Internal Medicine

## 2016-08-22 ENCOUNTER — Ambulatory Visit
Admission: RE | Admit: 2016-08-22 | Discharge: 2016-08-22 | Disposition: A | Discharge status: Home / Self Care | Payer: Medicare Other | Source: Ambulatory Visit | Attending: Internal Medicine | Admitting: Internal Medicine

## 2016-08-22 DIAGNOSIS — R921 Mammographic calcification found on diagnostic imaging of breast: Secondary | ICD-10-CM | POA: Diagnosis not present

## 2016-08-22 DIAGNOSIS — Z853 Personal history of malignant neoplasm of breast: Secondary | ICD-10-CM

## 2016-09-05 ENCOUNTER — Ambulatory Visit
Admission: RE | Admit: 2016-09-05 | Discharge: 2016-09-05 | Disposition: A | Discharge status: Home / Self Care | Payer: Medicare Other | Source: Ambulatory Visit | Attending: Internal Medicine | Admitting: Internal Medicine

## 2016-09-05 ENCOUNTER — Other Ambulatory Visit: Payer: Self-pay | Admitting: Internal Medicine

## 2016-09-05 DIAGNOSIS — R921 Mammographic calcification found on diagnostic imaging of breast: Secondary | ICD-10-CM

## 2016-09-06 ENCOUNTER — Other Ambulatory Visit: Payer: Self-pay | Admitting: Internal Medicine

## 2016-09-06 DIAGNOSIS — R921 Mammographic calcification found on diagnostic imaging of breast: Secondary | ICD-10-CM

## 2016-09-09 ENCOUNTER — Ambulatory Visit
Admission: RE | Admit: 2016-09-09 | Discharge: 2016-09-09 | Disposition: A | Discharge status: Home / Self Care | Payer: Medicare Other | Source: Ambulatory Visit | Attending: Internal Medicine | Admitting: Internal Medicine

## 2016-09-09 DIAGNOSIS — R921 Mammographic calcification found on diagnostic imaging of breast: Secondary | ICD-10-CM | POA: Diagnosis not present

## 2016-09-09 DIAGNOSIS — N6012 Diffuse cystic mastopathy of left breast: Secondary | ICD-10-CM | POA: Diagnosis not present

## 2016-09-09 HISTORY — PX: BREAST BIOPSY: SHX20

## 2016-09-12 ENCOUNTER — Ambulatory Visit (INDEPENDENT_AMBULATORY_CARE_PROVIDER_SITE_OTHER): Payer: Medicare Other | Admitting: Internal Medicine

## 2016-09-12 ENCOUNTER — Encounter: Payer: Self-pay | Admitting: Internal Medicine

## 2016-09-12 VITALS — BP 128/84 | HR 70 | Temp 97.3°F | Wt 227.5 lb

## 2016-09-12 DIAGNOSIS — I10 Essential (primary) hypertension: Secondary | ICD-10-CM | POA: Diagnosis not present

## 2016-09-12 NOTE — Progress Notes (Signed)
   Subjective:    Patient ID: Victoria [name], female    DOB: 1941/12/10, 75 y.o.   MRN: [id number]  HPI At last visit was here for CPE and had elevated BP at 152/98 however, today BP is WNL. Says she may have taken Zyrtec D before appt last time.   Recently had breast Bx which was benign.She is glad to hear this.    Review of Systems as above     Objective:   Physical Exam  Chest clear. Cardiac exam regular rate and rhythm. Extremities without edema.      Assessment & Plan:  Essential hypertension-stable on current regimen  Plan: Return in March for six-month follow-up.

## 2016-09-16 NOTE — Patient Instructions (Signed)
Continue same regimen and return in 6 months.

## 2016-10-08 ENCOUNTER — Encounter: Payer: Self-pay | Admitting: Internal Medicine

## 2016-10-08 ENCOUNTER — Ambulatory Visit (INDEPENDENT_AMBULATORY_CARE_PROVIDER_SITE_OTHER): Payer: Medicare Other | Admitting: Internal Medicine

## 2016-10-08 VITALS — BP 158/98 | HR 65 | Temp 99.0°F | Wt 221.5 lb

## 2016-10-08 DIAGNOSIS — J069 Acute upper respiratory infection, unspecified: Secondary | ICD-10-CM | POA: Diagnosis not present

## 2016-10-08 DIAGNOSIS — R05 Cough: Secondary | ICD-10-CM

## 2016-10-08 DIAGNOSIS — E119 Type 2 diabetes mellitus without complications: Secondary | ICD-10-CM

## 2016-10-08 DIAGNOSIS — R059 Cough, unspecified: Secondary | ICD-10-CM

## 2016-10-08 MED ORDER — AMOXICILLIN 500 MG PO CAPS: 500.0000 mg | ORAL_CAPSULE | Freq: Three times a day (TID) | ORAL | 0 refills | Status: DC

## 2016-10-08 MED ORDER — METHYLPREDNISOLONE ACETATE 80 MG/ML IJ SUSP
80.0000 mg | Freq: Once | INTRAMUSCULAR | Status: AC
  Administered 2016-10-08: 80 mg via INTRAMUSCULAR

## 2016-10-08 NOTE — Patient Instructions (Signed)
Amoxicillin 500 mg 3 times daily for 10 days. Depo-Medrol 80 mg IM.

## 2016-10-14 ENCOUNTER — Telehealth: Payer: Self-pay | Admitting: Internal Medicine

## 2016-10-14 ENCOUNTER — Other Ambulatory Visit: Payer: Self-pay | Admitting: *Deleted

## 2016-10-14 MED ORDER — BENZONATATE 100 MG PO CAPS: 100.0000 mg | ORAL_CAPSULE | Freq: Three times a day (TID) | ORAL | 0 refills | Status: DC | PRN

## 2016-10-14 NOTE — Telephone Encounter (Signed)
Call in Tessalon perles 100 mg tid #30 prn cough. Cannot call in Hydrcodone cough syrup. Pt did not want cough med at recent visit.

## 2016-10-14 NOTE — Telephone Encounter (Signed)
Done

## 2016-10-14 NOTE — Telephone Encounter (Signed)
Pt called wanting something to be called in for a cough. Pt states that can't hardly talk with out coughing. She uses the [facility] on [address]. Please advise.

## 2016-10-15 ENCOUNTER — Other Ambulatory Visit: Payer: Self-pay | Admitting: Internal Medicine

## 2016-10-22 NOTE — Progress Notes (Signed)
   Subjective:    Patient ID: Victoria [name], female    DOB: 10-14-1941, 75 y.o.   MRN: [id number]  HPI 75 year old Female with history of uncontrolled high 2 diabetes mellitus has come down with respiratory infection symptoms. No fever or chills. Has been hoarse congested and is coughing. Some discolored sputum production.    Review of Systems see above     Objective:   Physical Exam Skin warm and dry. Nodes none. Airex is clear. TMs are clear. Neck is supple. Chest clear to auscultation without rales or wheezing but she sounds hoarse and congested when she speaks       Assessment & Plan:  Acute Bronchitis  Controlled type 2 diabetes mellitus. Lasting blood and A1c 6.2% in [name]. She is treated with metformin  Remote history of hepatitis C status post Harvoni treatment  Essential hypertension-stable  Plan: Amoxicillin 500 mg 3 times daily for 10 days. Tessalon Perles 100 mg 3 times daily when necessary cough. Rest and drink plenty of fluids. Depo-Medrol 80 mg IM.

## 2016-10-29 ENCOUNTER — Other Ambulatory Visit: Payer: Self-pay | Admitting: Internal Medicine

## 2016-11-01 ENCOUNTER — Other Ambulatory Visit: Payer: Self-pay

## 2016-11-01 MED ORDER — ALPRAZOLAM 0.5 MG PO TABS: 0.5000 mg | ORAL_TABLET | Freq: Two times a day (BID) | ORAL | 0 refills | Status: DC | PRN

## 2016-11-18 ENCOUNTER — Other Ambulatory Visit: Payer: Self-pay | Admitting: Internal Medicine

## 2016-11-18 MED ORDER — GLUCOSE BLOOD VI STRP: ORAL_STRIP | 12 refills | Status: DC

## 2016-12-17 ENCOUNTER — Other Ambulatory Visit: Payer: Self-pay | Admitting: Internal Medicine

## 2017-01-12 ENCOUNTER — Other Ambulatory Visit: Payer: Self-pay | Admitting: Internal Medicine

## 2017-01-23 DIAGNOSIS — Z23 Encounter for immunization: Secondary | ICD-10-CM | POA: Diagnosis not present

## 2017-02-04 ENCOUNTER — Other Ambulatory Visit: Payer: Self-pay | Admitting: Internal Medicine

## 2017-02-13 DIAGNOSIS — S0502XA Injury of conjunctiva and corneal abrasion without foreign body, left eye, initial encounter: Secondary | ICD-10-CM | POA: Diagnosis not present

## 2017-03-10 ENCOUNTER — Other Ambulatory Visit: Payer: Self-pay | Admitting: Internal Medicine

## 2017-03-10 ENCOUNTER — Other Ambulatory Visit: Payer: Medicare Other | Admitting: Internal Medicine

## 2017-03-10 DIAGNOSIS — Z8619 Personal history of other infectious and parasitic diseases: Secondary | ICD-10-CM | POA: Diagnosis not present

## 2017-03-10 DIAGNOSIS — E119 Type 2 diabetes mellitus without complications: Secondary | ICD-10-CM | POA: Diagnosis not present

## 2017-03-11 ENCOUNTER — Encounter: Payer: Self-pay | Admitting: Internal Medicine

## 2017-03-11 ENCOUNTER — Ambulatory Visit (INDEPENDENT_AMBULATORY_CARE_PROVIDER_SITE_OTHER): Payer: Medicare Other | Admitting: Internal Medicine

## 2017-03-11 VITALS — BP 120/98 | HR 77 | Temp 97.7°F | Ht 69.0 in | Wt 219.0 lb

## 2017-03-11 DIAGNOSIS — Z8619 Personal history of other infectious and parasitic diseases: Secondary | ICD-10-CM | POA: Diagnosis not present

## 2017-03-11 DIAGNOSIS — I10 Essential (primary) hypertension: Secondary | ICD-10-CM

## 2017-03-11 DIAGNOSIS — E119 Type 2 diabetes mellitus without complications: Secondary | ICD-10-CM | POA: Diagnosis not present

## 2017-03-11 DIAGNOSIS — Z853 Personal history of malignant neoplasm of breast: Secondary | ICD-10-CM | POA: Diagnosis not present

## 2017-03-11 LAB — HEMOGLOBIN A1C
Hgb A1c MFr Bld: 6.1 % — ABNORMAL HIGH (ref <5.7)
Mean Plasma Glucose: 128 mg/dL

## 2017-03-11 LAB — MICROALBUMIN / CREATININE URINE RATIO

## 2017-03-11 NOTE — Progress Notes (Signed)
   Subjective:    Patient ID: Victoria [name], female    DOB: 09/29/1941, 76 y.o.   MRN: [id number]  HPI 76 year old Female with history of essential hypertension and diabetes mellitus in today for six-month follow-up. She feels well. She had a flu shot at a local pharmacy perhaps in January. Has not been ill.  She has a remote history of hepatitis C treated with hard bony. She wants her liver functions checked. We can add this to labs drawn yesterday. Her hemoglobin A1c is excellent. However, says she is only taking Glucophage 500 mg once a day. Takes it in the evenings with supper.  Regarding hypertension she is on losartan HCTZ 100/25 daily. She also takes amlodipine 2.5 mg daily.  She saw Dr. Gershon Crane in February for eye exam.  Hemoglobin A1c is 6.1% in 7 months ago was 6.2%. Urine microalbumin pending. In [name] 2017 lipid panel was normal and liver functions were normal. BUN and creatinine were normal.    Review of Systems see above     Objective:   Physical Exam Skin warm and dry. Nodes none. Neck supple without JVD or thyromegaly. No carotid bruits. Chest clear to auscultation. Cardiac exam regular rate and rhythm normal S1 and S2. Extremities without pitting edema. Diabetic foot exam within normal limits.       Assessment & Plan:  Controlled type 2 diabetes mellitus on Glucophage once daily  Essential hypertension  History of hepatitis C  Remote history of breast cancer  Plan: Patient will light liver functions and lipid panel checked. We can add this to lab done yesterday. She says she was fasting at the time.  Schedule physical examination for late [name] or early September 2018. Continue same medications.

## 2017-03-11 NOTE — Patient Instructions (Signed)
Continue current medications and return in 6 months for physical examination.

## 2017-03-12 LAB — HEPATIC FUNCTION PANEL
ALT: 8 U/L (ref 6–29)
AST: 11 U/L (ref 10–35)
Albumin: 4 g/dL (ref 3.6–5.1)
Alkaline Phosphatase: 77 U/L (ref 33–130)
Bilirubin, Direct: 0.2 mg/dL (ref <=0.2)
Indirect Bilirubin: 0.5 mg/dL (ref 0.2–1.2)
Total Bilirubin: 0.7 mg/dL (ref 0.2–1.2)
Total Protein: 7.3 g/dL (ref 6.1–8.1)

## 2017-03-12 LAB — LIPID PANEL
Cholesterol: 153 mg/dL (ref <200)
HDL: 53 mg/dL (ref >50)
LDL Cholesterol: 83 mg/dL (ref <100)
Total CHOL/HDL Ratio: 2.9 Ratio (ref <5.0)
Triglycerides: 86 mg/dL (ref <150)
VLDL: 17 mg/dL (ref <30)

## 2017-03-13 LAB — MICROALBUMIN / CREATININE URINE RATIO
Creatinine, Urine: 162 mg/dL (ref 20–320)
Microalb Creat Ratio: 6 mcg/mg creat (ref <30)
Microalb, Ur: 0.9 mg/dL

## 2017-04-10 ENCOUNTER — Other Ambulatory Visit: Payer: Self-pay | Admitting: Internal Medicine

## 2017-04-17 ENCOUNTER — Ambulatory Visit (INDEPENDENT_AMBULATORY_CARE_PROVIDER_SITE_OTHER): Payer: Medicare Other | Admitting: Internal Medicine

## 2017-04-17 ENCOUNTER — Telehealth: Payer: Self-pay | Admitting: Internal Medicine

## 2017-04-17 ENCOUNTER — Encounter: Payer: Self-pay | Admitting: Internal Medicine

## 2017-04-17 VITALS — BP 140/88 | HR 65 | Temp 98.0°F | Ht 69.0 in | Wt 224.0 lb

## 2017-04-17 DIAGNOSIS — J0100 Acute maxillary sinusitis, unspecified: Secondary | ICD-10-CM

## 2017-04-17 MED ORDER — AMOXICILLIN 500 MG PO CAPS
500.0000 mg | ORAL_CAPSULE | Freq: Three times a day (TID) | ORAL | 0 refills | Status: DC
Start: 2017-04-17 — End: 2017-11-04

## 2017-04-17 MED ORDER — METHYLPREDNISOLONE ACETATE 80 MG/ML IJ SUSP
80.0000 mg | Freq: Once | INTRAMUSCULAR | Status: AC
  Administered 2017-04-17: 80 mg via INTRAMUSCULAR

## 2017-04-17 NOTE — Patient Instructions (Signed)
Amoxicillin 500 mg 3 times daily for 10 days. Depo-Medrol 80 mg IM given in office. Appointment for allergy testing.

## 2017-04-17 NOTE — Progress Notes (Signed)
   Subjective:    Patient ID: Victoria [name], female    DOB: 02/08/1941, 76 y.o.   MRN: [id number]  HPI Patient here today with respiratory infection symptoms. Has sinus pressure, discolored nasal drainage. Has been taking over-the-counter medication without relief. Last year in October for similar illness. Generally responds to antibiotics and Depo-Medrol. Has never been allergy tested. We will see if we can arrange an appointment for that. Feels that she never completely gets over the sinus infection symptoms.    Review of Systems     Objective:   Physical Exam  Sounds nasally congested when she speaks. TMs are clear. Pharynx is clear. Neck supple. Chest clear.      Assessment & Plan:  Acute maxillary sinusitis  Suspected allergic rhinitis  Plan: Allergy appointment for evaluation. Amoxicillin 500 mg 3 times daily for 10 days. Depo-Medrol 80 mg IM.

## 2017-04-17 NOTE — Telephone Encounter (Signed)
Patient has appointment with Dr. Ninetta Lights. 05/21/17 @ 2:00 p.m.   Patient will be mailed a packet of information to complete and instructed to take this information with her to the appointment.  No antihistamines 3 days prior to her appointment.  Patient verbalized understanding of these instructions.

## 2017-05-21 ENCOUNTER — Ambulatory Visit: Payer: Medicare Other | Admitting: Allergy & Immunology

## 2017-06-02 ENCOUNTER — Telehealth: Payer: Self-pay

## 2017-06-02 MED ORDER — GLUCOSE BLOOD VI STRP: ORAL_STRIP | 12 refills | Status: DC

## 2017-06-02 NOTE — Telephone Encounter (Signed)
Pt stated that the pharmacy told her they have sent a refill request to [place] twice. We have no documentation of that. She needed her test strips sent in so I refilled them for one year.

## 2017-07-04 ENCOUNTER — Other Ambulatory Visit: Payer: Self-pay | Admitting: Internal Medicine

## 2017-07-31 ENCOUNTER — Other Ambulatory Visit: Payer: Self-pay | Admitting: Internal Medicine

## 2017-08-18 ENCOUNTER — Telehealth: Payer: Self-pay

## 2017-08-18 MED ORDER — GLUCOSE BLOOD VI STRP: ORAL_STRIP | 11 refills | Status: DC

## 2017-08-18 NOTE — Telephone Encounter (Signed)
Received fax from [facility] in regards to a refill on test strips for patient. Medication was refilled per Dr. Verlene Mayer request. Sent 1 year supply

## 2017-08-20 ENCOUNTER — Telehealth: Payer: Self-pay | Admitting: Gastroenterology

## 2017-08-20 MED ORDER — GLUCOSE BLOOD VI STRP: ORAL_STRIP | 11 refills | Status: DC

## 2017-08-20 NOTE — Addendum Note (Signed)
Addended by: Drucilla Schmidt on: 08/20/2017 08:40 AM   Modules accepted: Orders

## 2017-08-20 NOTE — Telephone Encounter (Signed)
Dr.Nandigam reviewed previous colonoscopy reports and recommends patient has a repeat colonoscopy at [facility]. Left message on both numbers we have in Epic for patient to call back and schedule appointment.

## 2017-08-28 ENCOUNTER — Other Ambulatory Visit: Payer: Medicare Other | Admitting: Internal Medicine

## 2017-08-29 ENCOUNTER — Encounter: Payer: Medicare Other | Admitting: Internal Medicine

## 2017-09-11 ENCOUNTER — Encounter: Payer: Self-pay | Admitting: Gastroenterology

## 2017-09-16 DIAGNOSIS — Z23 Encounter for immunization: Secondary | ICD-10-CM | POA: Diagnosis not present

## 2017-10-08 ENCOUNTER — Telehealth: Payer: Self-pay

## 2017-10-08 MED ORDER — ALPRAZOLAM 0.5 MG PO TABS: 0.5000 mg | ORAL_TABLET | Freq: Two times a day (BID) | ORAL | 2 refills | Status: DC | PRN

## 2017-10-08 NOTE — Telephone Encounter (Signed)
Received fax from [facility] in regards to a refill on Xanax 0.5mg  for patient. Medication was refilled per Dr. Verlene Mayer request. Sent 90 days.

## 2017-10-14 ENCOUNTER — Other Ambulatory Visit: Payer: Self-pay

## 2017-10-14 MED ORDER — ALPRAZOLAM 0.5 MG PO TABS: 0.5000 mg | ORAL_TABLET | Freq: Two times a day (BID) | ORAL | 0 refills | Status: DC | PRN

## 2017-10-14 NOTE — Telephone Encounter (Signed)
rx called in to [facility], [address] [phone] (Phone) [phone] (Fax)

## 2017-10-16 ENCOUNTER — Telehealth: Payer: Self-pay

## 2017-10-16 MED ORDER — ALPRAZOLAM 0.5 MG PO TABS: 0.5000 mg | ORAL_TABLET | Freq: Two times a day (BID) | ORAL | 0 refills | Status: DC | PRN

## 2017-10-16 NOTE — Telephone Encounter (Signed)
Received fax from [facility] in regards to a refill on Xanax 0.5mg  for patient. Medication was refilled per Dr. Verlene Mayer request. Sent 6 months'

## 2017-10-30 ENCOUNTER — Other Ambulatory Visit: Payer: Self-pay | Admitting: Internal Medicine

## 2017-11-04 ENCOUNTER — Ambulatory Visit (INDEPENDENT_AMBULATORY_CARE_PROVIDER_SITE_OTHER): Payer: Medicare Other | Admitting: Internal Medicine

## 2017-11-04 ENCOUNTER — Encounter: Payer: Self-pay | Admitting: Internal Medicine

## 2017-11-04 VITALS — BP 138/82 | HR 76 | Temp 98.1°F | Wt 209.0 lb

## 2017-11-04 DIAGNOSIS — I10 Essential (primary) hypertension: Secondary | ICD-10-CM | POA: Diagnosis not present

## 2017-11-04 DIAGNOSIS — J069 Acute upper respiratory infection, unspecified: Secondary | ICD-10-CM

## 2017-11-04 DIAGNOSIS — H8110 Benign paroxysmal vertigo, unspecified ear: Secondary | ICD-10-CM

## 2017-11-04 DIAGNOSIS — J0100 Acute maxillary sinusitis, unspecified: Secondary | ICD-10-CM | POA: Diagnosis not present

## 2017-11-04 MED ORDER — AMOXICILLIN 500 MG PO CAPS: 500.0000 mg | ORAL_CAPSULE | Freq: Three times a day (TID) | ORAL | 0 refills | Status: DC

## 2017-11-04 MED ORDER — METHYLPREDNISOLONE ACETATE 80 MG/ML IJ SUSP
80.0000 mg | Freq: Once | INTRAMUSCULAR | Status: AC
  Administered 2017-11-04: 80 mg via INTRAMUSCULAR

## 2017-11-04 MED ORDER — ALPRAZOLAM 0.5 MG PO TABS: 0.5000 mg | ORAL_TABLET | Freq: Two times a day (BID) | ORAL | 0 refills | Status: DC | PRN

## 2017-11-05 NOTE — Progress Notes (Signed)
   Subjective:    Patient ID: Victoria [name], female    DOB: 1941/01/02, 76 y.o.   MRN: [id number]  HPI 76 year old Black Female recently went on a cruise to  [place], [place] which originated in [place], [place].  It was a short cruise but the patient fell ill shortly after getting on the cruciate.  She suffered from vertigo and had nausea.  Was not able to walk well.  She saw Dr. on the ship.  Was diagnosed with vertigo.  Was placed on meclizine.  Apparently could not get off the ship to really enjoy any of the day trips or activities.  Still feeling somewhat dizzy.  Thinks she has come down now with a sinus infection.  Has some discolored nasal drainage and sinus pressure.  Also apparently is out of her Xanax that she takes for anxiety.  This will also help dizziness.    Review of Systems see above     Objective:   Physical Exam Skin warm and dry.  Nodes none.  Pharynx only slightly injected.  TMs are slightly full bilaterally but not red.  Neck is supple without JVD thyromegaly or carotid bruits.  Chest clear to auscultation cardiac exam regular rate and rhythm normal S1 and S2.  Brief neurological exam shows no focal deficits and cannot demonstrate nystagmus today.  PERRLA.  Alert and oriented x3.  Gait is stable.       Assessment & Plan:  Probable benign positional vertigo  Acute sinusitis  Anxiety  Plan: Refill Xanax as prescribed.  Depo-Medrol 80 mg IM to help decongest her ears and started her on amoxicillin 500 mg 3 times a day for 10 days.  She has an appointment for physical exam on November 26.

## 2017-11-05 NOTE — Patient Instructions (Signed)
Take amoxicillin as prescribed.  Depo-Medrol 80 mg IM take Xanax as prescribed and follow-up.  November 26.

## 2017-11-11 ENCOUNTER — Other Ambulatory Visit: Payer: Medicare Other | Admitting: Internal Medicine

## 2017-11-11 DIAGNOSIS — Z0000 Encounter for general adult medical examination without abnormal findings: Secondary | ICD-10-CM | POA: Diagnosis not present

## 2017-11-11 DIAGNOSIS — J309 Allergic rhinitis, unspecified: Secondary | ICD-10-CM | POA: Diagnosis not present

## 2017-11-11 DIAGNOSIS — Z853 Personal history of malignant neoplasm of breast: Secondary | ICD-10-CM | POA: Diagnosis not present

## 2017-11-11 DIAGNOSIS — I10 Essential (primary) hypertension: Secondary | ICD-10-CM | POA: Diagnosis not present

## 2017-11-11 DIAGNOSIS — E118 Type 2 diabetes mellitus with unspecified complications: Secondary | ICD-10-CM

## 2017-11-11 DIAGNOSIS — F419 Anxiety disorder, unspecified: Secondary | ICD-10-CM

## 2017-11-12 LAB — COMPLETE METABOLIC PANEL WITH GFR
AG Ratio: 1.4 (calc) (ref 1.0–2.5)
ALT: 10 U/L (ref 6–29)
AST: 14 U/L (ref 10–35)
Albumin: 4.2 g/dL (ref 3.6–5.1)
Alkaline phosphatase (APISO): 61 U/L (ref 33–130)
BUN: 13 mg/dL (ref 7–25)
CO2: 30 mmol/L (ref 20–32)
Calcium: 9.3 mg/dL (ref 8.6–10.4)
Chloride: 102 mmol/L (ref 98–110)
Creat: 0.82 mg/dL (ref 0.60–0.93)
GFR, Est African American: 82 mL/min/{1.73_m2} (ref >=60)
GFR, Est Non African American: 70 mL/min/{1.73_m2} (ref >=60)
Globulin: 3 g/dL (calc) (ref 1.9–3.7)
Glucose, Bld: 103 mg/dL — ABNORMAL HIGH (ref 65–99)
Potassium: 4.3 mmol/L (ref 3.5–5.3)
Sodium: 140 mmol/L (ref 135–146)
Total Bilirubin: 0.4 mg/dL (ref 0.2–1.2)
Total Protein: 7.2 g/dL (ref 6.1–8.1)

## 2017-11-12 LAB — LIPID PANEL
Cholesterol: 151 mg/dL (ref <200)
HDL: 62 mg/dL (ref >50)
LDL Cholesterol (Calc): 75 mg/dL (calc)
Non-HDL Cholesterol (Calc): 89 mg/dL (calc) (ref <130)
Total CHOL/HDL Ratio: 2.4 (calc) (ref <5.0)
Triglycerides: 48 mg/dL (ref <150)

## 2017-11-12 LAB — CBC WITH DIFFERENTIAL/PLATELET
Basophils Absolute: 20 cells/uL (ref 0–200)
Basophils Relative: 0.3 %
Eosinophils Absolute: 59 cells/uL (ref 15–500)
Eosinophils Relative: 0.9 %
HCT: 38.4 % (ref 35.0–45.0)
Hemoglobin: 12.8 g/dL (ref 11.7–15.5)
Lymphs Abs: 2950 cells/uL (ref 850–3900)
MCH: 27.5 pg (ref 27.0–33.0)
MCHC: 33.3 g/dL (ref 32.0–36.0)
MCV: 82.6 fL (ref 80.0–100.0)
MPV: 9.8 fL (ref 7.5–12.5)
Monocytes Relative: 5.8 %
Neutro Abs: 3188 cells/uL (ref 1500–7800)
Neutrophils Relative %: 48.3 %
Platelets: 198 10*3/uL (ref 140–400)
RBC: 4.65 10*6/uL (ref 3.80–5.10)
RDW: 14.2 % (ref 11.0–15.0)
Total Lymphocyte: 44.7 %
WBC mixed population: 383 cells/uL (ref 200–950)
WBC: 6.6 10*3/uL (ref 3.8–10.8)

## 2017-11-12 LAB — MICROALBUMIN / CREATININE URINE RATIO
Creatinine, Urine: 177 mg/dL (ref 20–275)
Microalb Creat Ratio: 10 mcg/mg creat (ref <30)
Microalb, Ur: 1.7 mg/dL

## 2017-11-12 LAB — TSH: TSH: 0.64 mIU/L (ref 0.40–4.50)

## 2017-11-12 LAB — HEMOGLOBIN A1C
Hgb A1c MFr Bld: 5.7 % of total Hgb — ABNORMAL HIGH (ref <5.7)
Mean Plasma Glucose: 117 (calc)
eAG (mmol/L): 6.5 (calc)

## 2017-11-17 ENCOUNTER — Ambulatory Visit (INDEPENDENT_AMBULATORY_CARE_PROVIDER_SITE_OTHER): Payer: Medicare Other | Admitting: Internal Medicine

## 2017-11-17 ENCOUNTER — Encounter: Payer: Self-pay | Admitting: Internal Medicine

## 2017-11-17 VITALS — BP 130/90 | HR 78 | Temp 98.4°F | Ht 67.0 in | Wt 211.0 lb

## 2017-11-17 DIAGNOSIS — I10 Essential (primary) hypertension: Secondary | ICD-10-CM

## 2017-11-17 DIAGNOSIS — H8110 Benign paroxysmal vertigo, unspecified ear: Secondary | ICD-10-CM

## 2017-11-17 DIAGNOSIS — Z8619 Personal history of other infectious and parasitic diseases: Secondary | ICD-10-CM

## 2017-11-17 DIAGNOSIS — E119 Type 2 diabetes mellitus without complications: Secondary | ICD-10-CM | POA: Diagnosis not present

## 2017-11-17 DIAGNOSIS — Z0000 Encounter for general adult medical examination without abnormal findings: Secondary | ICD-10-CM | POA: Diagnosis not present

## 2017-11-17 DIAGNOSIS — Z23 Encounter for immunization: Secondary | ICD-10-CM

## 2017-11-17 DIAGNOSIS — Z9889 Other specified postprocedural states: Secondary | ICD-10-CM

## 2017-11-17 DIAGNOSIS — J0100 Acute maxillary sinusitis, unspecified: Secondary | ICD-10-CM

## 2017-11-17 DIAGNOSIS — Z9009 Acquired absence of other part of head and neck: Secondary | ICD-10-CM

## 2017-11-17 DIAGNOSIS — Z853 Personal history of malignant neoplasm of breast: Secondary | ICD-10-CM

## 2017-11-17 DIAGNOSIS — E890 Postprocedural hypothyroidism: Secondary | ICD-10-CM

## 2017-11-17 LAB — POCT URINALYSIS DIPSTICK
Bilirubin, UA: NEGATIVE
Blood, UA: NEGATIVE
Glucose, UA: NEGATIVE
Ketones, UA: NEGATIVE
Leukocytes, UA: NEGATIVE
Nitrite, UA: NEGATIVE
Protein, UA: NEGATIVE
Spec Grav, UA: 1.015 (ref 1.010–1.025)
Urobilinogen, UA: 0.2 E.U./dL
pH, UA: 6.5 (ref 5.0–8.0)

## 2017-11-17 MED ORDER — LEVOFLOXACIN 500 MG PO TABS: 500.0000 mg | ORAL_TABLET | Freq: Every day | ORAL | 0 refills | Status: DC

## 2017-11-17 NOTE — Progress Notes (Signed)
Subjective:    Patient ID: Victoria [name], female    DOB: Mar 28, 1941, 76 y.o.   MRN: [id number]  HPI  76 year old Female for health maintenance exam, Medicare wellness, and evaluation of medical issues. Still has sinusitis symptoms with pressure in sinuses. Wants a stronger antibiotic.  Seen November 13 with sinusitis and vertigo   Xanax refilled.  Given Depo-Medrol and amoxicillin 500 mg 3 times a day for 10 days.  Plans to have mammogram in the near future as well as colonoscopy.  She has a history of diabetes mellitus treated with oral medications.  She has a history of hepatitis C for which she received 12 weeks of Harvoni which she completed in [name] 2015 and is done well.  Dr. Gershon Crane does her annual eye exams.  History of bilateral cataract extractions February 2014.  History of hypertension.  Remote history of breast cancer.  History of allergic rhinitis.  History of trigger finger right third finger.  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  There was a reexcision in September 2000 showing a single microscopic focus of intraductal carcinoma.  She did have radiation therapy.  Subsequently she was lost to follow-up by oncology after 2005.  She did have breast reduction surgery in January 2005 on the right breast and a 1 cm mucinous ductal hyperplasia.  2 lymph nodes were negative for tumor.  Tumor was estrogen receptor and progesterone receptor positive.  She was referred to radiation oncology for further radiation of remaining right breast tissue.  She had been on estrogen replacement since 1997 at the time of her diagnosis.  There is no family history of breast cancer.  Tamoxifen was recommended but she declined to take it.  History of abnormal Pap smear 2005.  In April 2014 she had gallstone pancreatitis and underwent cholecystectomy by Dr. Lucia Gaskins.  Liver biopsy revealed cirrhosis and that is when hepatitis C  was discovered.  Going for colonoscopy in near future. Needs mammogram.  History of partial left thyroidectomy at [facility] in [facility]  She has never been on thyroid replacement and TSH is always been within normal social history: She previously worked in a nursing home.  She is currently retired.  Does not smoke or consume alcohol.  Family history: Sister died of colon cancer.  Patient has 3 children.      Review of Systems  HENT: Positive for sinus pressure.   Respiratory: Negative.   Cardiovascular: Negative.   Genitourinary: Negative.   Neurological: Positive for dizziness.  Psychiatric/Behavioral:       History of anxiety       Objective:   Physical Exam  Constitutional: She is oriented to person, place, and time. She appears well-developed and well-nourished. No distress.  HENT:  Head: Normocephalic and atraumatic.  Right Ear: External ear normal.  Left Ear: External ear normal.  Mouth/Throat: Oropharynx is clear and moist.  Eyes: Conjunctivae and EOM are normal. Pupils are equal, round, and reactive to light. Right eye exhibits no discharge. Left eye exhibits no discharge.  Neck: No JVD present. No thyromegaly present.  Cardiovascular: Normal rate, regular rhythm and normal heart sounds.  No murmur heard. Pulmonary/Chest: Effort normal and breath sounds normal. No respiratory distress. She has no wheezes. She has no rales. She exhibits no tenderness.  Fibrocystic changes both breasts  Abdominal: Soft. Bowel sounds are normal. She exhibits no distension and no mass.  There is no tenderness. There is no rebound and no guarding.  Genitourinary:  Genitourinary Comments: Bimanual normal.  Pap deferred due to age  Musculoskeletal: She exhibits no edema.  Lymphadenopathy:    She has no cervical adenopathy.  Neurological: She is alert and oriented to person, place, and time. She has normal reflexes. No cranial nerve deficit.  Skin: Skin is warm and  dry. No rash noted. She is not diaphoretic.  Psychiatric: She has a normal mood and affect. Her behavior is normal. Judgment and thought content normal.  Vitals reviewed.         Assessment & Plan:  Vertigo  Maxillary sinusitis  History of breast cancer  Diabetes mellitus  History of partial thyroidectomy  History of abnormal Pap smear 2005  History of hepatitis C  History of gallstone pancreatitis status post cholecystectomy  Hypertension  Plan: We will place her on Levaquin 500 mg for 10 days.  She will continue with amlodipine Metformin Flonase and albuterol inhaler.  She will continue losartan HCTZ.  She wants to receive freestyle labra on glucose monitor.  We will sign order.  Prevnar 13 given.  She is to return in 6 months or as needed.  She has meclizine for vertigo but vertigo has improved some.  Subjective:   Patient presents for Medicare Annual/Subsequent preventive examination.  Review Past Medical/Family/Social: See above  Risk Factors  Current exercise habits: Mostly sedentary Dietary issues discussed: Low-fat low carbohydrate  Cardiac risk factors: Diabetes mellitus  Depression Screen  (Note: if answer to either of the following is "Yes", a more complete depression screening is indicated)   Over the past two weeks, have you felt down, depressed or hopeless? No  Over the past two weeks, have you felt little interest or pleasure in doing things? No Have you lost interest or pleasure in daily life? No Do you often feel hopeless? No Do you cry easily over simple problems? No   Activities of Daily Living  In your present state of health, do you have any difficulty performing the following activities?:   Driving? No  Managing money? No  Feeding yourself? No  Getting from bed to chair? No  Climbing a flight of stairs? No  Preparing food and eating?: No  Bathing or showering? No  Getting dressed: No  Getting to the toilet? No  Using the toilet:No    Moving around from place to place: No  In the past year have you fallen or had a near fall?:No  Are you sexually active?  Yes Do you have more than one partner? No   Hearing Difficulties: No  Do you often ask people to speak up or repeat themselves? No  Do you experience ringing or noises in your ears? No  Do you have difficulty understanding soft or whispered voices? No  Do you feel that you have a problem with memory? No Do you often misplace items? No    Home Safety:  Do you have a smoke alarm at your residence? Yes Do you have grab bars in the bathroom?  No Do you have throw rugs in your house?  No   Cognitive Testing  Alert? Yes Normal Appearance?Yes  Oriented to person? Yes Place? Yes  Time? Yes  Recall of three objects? Yes  Can perform simple calculations? Yes  Displays appropriate judgment?Yes  Can read the correct time from a watch face?Yes   List the Names of Other Physician/Practitioners you currently use:  See referral list for the  physicians patient is currently seeing.     Review of Systems: See above   Objective:     General appearance: Appears stated age and mildly obese  Head: Normocephalic, without obvious abnormality, atraumatic  Eyes: conj clear, EOMi PEERLA  Ears: normal TM's and external ear canals both ears  Nose: Nares normal. Septum midline. Mucosa normal. No drainage or sinus tenderness.  Throat: lips, mucosa, and tongue normal; teeth and gums normal  Neck: no adenopathy, no carotid bruit, no JVD, supple, symmetrical, trachea midline and thyroid not enlarged, symmetric, no tenderness/mass/nodules  No CVA tenderness.  Lungs: clear to auscultation bilaterally  Breasts: normal appearance, no masses or tenderness Heart: regular rate and rhythm, S1, S2 normal, no murmur, click, rub or gallop  Abdomen: soft, non-tender; bowel sounds normal; no masses, no organomegaly  Musculoskeletal: ROM normal in all joints, no crepitus, no deformity, Normal  muscle strengthen. Back  is symmetric, no curvature. Skin: Skin color, texture, turgor normal. No rashes or lesions  Lymph nodes: Cervical, supraclavicular, and axillary nodes normal.  Neurologic: CN 2 -12 Normal, Normal symmetric reflexes. Normal coordination and gait  Psych: Alert & Oriented x 3, Mood appear stable.    Assessment:    Annual wellness medicare exam   Plan:    During the course of the visit the patient was educated and counseled about appropriate screening and preventive services including:  Annual mammogram  Prevnar 13 given  Received flu vaccine at [facility]      Patient Instructions (the written plan) was given to the patient.  Medicare Attestation  I have personally reviewed:  The patient's medical and social history  Their use of alcohol, tobacco or illicit drugs  Their current medications and supplements  The patient's functional ability including ADLs,fall risks, home safety risks, cognitive, and hearing and visual impairment  Diet and physical activities  Evidence for depression or mood disorders  The patient's weight, height, BMI, and visual acuity have been recorded in the chart. I have made referrals, counseling, and provided education to the patient based on review of the above and I have provided the patient with a written personalized care plan for preventive services.

## 2017-11-18 ENCOUNTER — Other Ambulatory Visit: Payer: Self-pay | Admitting: Internal Medicine

## 2017-11-18 NOTE — Progress Notes (Signed)
ERROR

## 2017-11-18 NOTE — Patient Instructions (Addendum)
Change amoxicillin to Levaquin 500 mg daily for 10 days.  Prevnar 13 given.  Continue other medications as previously prescribed.  Had mammogram and colonoscopy.

## 2017-12-12 ENCOUNTER — Emergency Department (HOSPITAL_COMMUNITY): Payer: Medicare Other

## 2017-12-12 ENCOUNTER — Encounter (HOSPITAL_COMMUNITY): Payer: Self-pay | Admitting: Emergency Medicine

## 2017-12-12 ENCOUNTER — Emergency Department (HOSPITAL_COMMUNITY)
Admission: EM | Admit: 2017-12-12 | Discharge: 2017-12-12 | Disposition: A | Discharge status: Home / Self Care | Payer: Medicare Other | Attending: Emergency Medicine | Admitting: Emergency Medicine

## 2017-12-12 ENCOUNTER — Other Ambulatory Visit: Payer: Self-pay

## 2017-12-12 DIAGNOSIS — Z853 Personal history of malignant neoplasm of breast: Secondary | ICD-10-CM | POA: Diagnosis not present

## 2017-12-12 DIAGNOSIS — S82892A Other fracture of left lower leg, initial encounter for closed fracture: Secondary | ICD-10-CM

## 2017-12-12 DIAGNOSIS — W19XXXA Unspecified fall, initial encounter: Secondary | ICD-10-CM

## 2017-12-12 DIAGNOSIS — Y939 Activity, unspecified: Secondary | ICD-10-CM | POA: Diagnosis not present

## 2017-12-12 DIAGNOSIS — S8265XA Nondisplaced fracture of lateral malleolus of left fibula, initial encounter for closed fracture: Secondary | ICD-10-CM | POA: Diagnosis not present

## 2017-12-12 DIAGNOSIS — E119 Type 2 diabetes mellitus without complications: Secondary | ICD-10-CM | POA: Insufficient documentation

## 2017-12-12 DIAGNOSIS — Y929 Unspecified place or not applicable: Secondary | ICD-10-CM | POA: Insufficient documentation

## 2017-12-12 DIAGNOSIS — Z7984 Long term (current) use of oral hypoglycemic drugs: Secondary | ICD-10-CM | POA: Diagnosis not present

## 2017-12-12 DIAGNOSIS — S8262XA Displaced fracture of lateral malleolus of left fibula, initial encounter for closed fracture: Secondary | ICD-10-CM | POA: Diagnosis not present

## 2017-12-12 DIAGNOSIS — Y999 Unspecified external cause status: Secondary | ICD-10-CM | POA: Diagnosis not present

## 2017-12-12 DIAGNOSIS — I10 Essential (primary) hypertension: Secondary | ICD-10-CM | POA: Diagnosis not present

## 2017-12-12 DIAGNOSIS — Z79899 Other long term (current) drug therapy: Secondary | ICD-10-CM | POA: Insufficient documentation

## 2017-12-12 DIAGNOSIS — S99922A Unspecified injury of left foot, initial encounter: Secondary | ICD-10-CM | POA: Diagnosis present

## 2017-12-12 DIAGNOSIS — W010XXA Fall on same level from slipping, tripping and stumbling without subsequent striking against object, initial encounter: Secondary | ICD-10-CM | POA: Diagnosis not present

## 2017-12-12 DIAGNOSIS — S82832A Other fracture of upper and lower end of left fibula, initial encounter for closed fracture: Secondary | ICD-10-CM | POA: Diagnosis not present

## 2017-12-12 MED ORDER — HYDROCODONE-ACETAMINOPHEN 5-325 MG PO TABS: 1.0000 | ORAL_TABLET | Freq: Four times a day (QID) | ORAL | 0 refills | Status: DC | PRN

## 2017-12-12 MED ORDER — HYDROCODONE-ACETAMINOPHEN 5-325 MG PO TABS
1.0000 | ORAL_TABLET | Freq: Once | ORAL | Status: AC
  Administered 2017-12-12: 1 via ORAL
  Filled 2017-12-12: qty 1

## 2017-12-12 NOTE — ED Triage Notes (Addendum)
Pt complaint of left foot, ankle, and lower leg pain post fall down an incline last night. Swelling and bruising noted.

## 2017-12-12 NOTE — Discharge Instructions (Signed)
Call orthopedics for follow-up.  Elevate left leg is much as possible.  Keep the Cam walker on at all times.  Take the pain medicine as needed.  Return for any new or worse symptoms.

## 2017-12-12 NOTE — ED Provider Notes (Signed)
[facility] DEPT Provider Note   CSN: [id number] Arrival date & time: 12/12/17  1411     History   Chief Complaint Chief Complaint  Patient presents with  . Fall    HPI Victoria [name] is a 76 y.o. female.  Patient with a fall yesterday afternoon on the p.m. slipped on the wet ground.  Pain to her left foot and ankle area since the fall.  Patient has been able to ambulate on it but with pain.  No other injuries or concerns.  Did not hit head no loss of consciousness.      Past Medical History:  Diagnosis Date  . Abnormal finding on Pap smear   . Breast cancer ([place])   . Cholelithiasis 11-2012  . Cirrhosis ([place]) 11-2012  . Diabetes mellitus   . Gallstones   . Hepatitis C 11-2012   Active  . Hypertension   . Obesity   . Pancreatitis, acute 11-2012  . Thrombocytopenia ([place]) 11-2012    Patient Active Problem List   Diagnosis Date Noted  . History of pancreatitis 05/07/2013  . Status post cholecystectomy 05/07/2013  . Hepatitis C, chronic ([place]) 01/19/2013  . Thrombocytopenia ([place]) 12/14/2012  . Cirrhosis ([place]) 12/14/2012  . Allergic rhinitis 10/05/2012  . Diabetes mellitus ([place]) 10/07/2011  . Hypertension 10/07/2011  . History of breast cancer 10/07/2011  . Anxiety 10/07/2011    Past Surgical History:  Procedure Laterality Date  . BREAST LUMPECTOMY  right  . BREAST LUMPECTOMY    . BREAST REDUCTION SURGERY  2005  . CATARACT EXTRACTION, BILATERAL    . CHOLECYSTECTOMY N/A 03/30/2013   Procedure: LAPAROSCOPIC CHOLECYSTECTOMY WITH INTRAOPERATIVE CHOLANGIOGRAM;  Surgeon: Shann Medal, MD;  Location: [facility] ORS;  Service: General;  Laterality: N/A;  . EUS  01/28/2013   Procedure: UPPER ENDOSCOPIC ULTRASOUND (EUS) LINEAR;  Surgeon: Milus Banister, MD;  Location: [facility] ENDOSCOPY;  Service: Endoscopy;  Laterality: N/A;  ride-adrian  [place] [phone]  . EXCISION OF SKIN TAG Right 03/30/2013   Procedure: EXCISION OF SKIN TAG;  Surgeon: Shann Medal, MD;  Location: [facility] ORS;  Service: General;  Laterality: Right;  . LIVER BIOPSY  03/30/2013   Procedure: LIVER BIOPSY;  Surgeon: Shann Medal, MD;  Location: [facility] ORS;  Service: General;;  . THYROIDECTOMY, PARTIAL  1972   left    OB History    No data available       Home Medications    Prior to Admission medications   Medication Sig Start Date End Date Taking? Authorizing Provider  ALPRAZolam [name]) 0.5 MG tablet Take 1 tablet (0.5 mg total) 2 (two) times daily as needed by mouth for anxiety. 11/04/17  Yes Baxley, Cresenciano Lick, MD  amLODipine (NORVASC) 2.5 MG tablet TAKE ONE TABLET BY MOUTH AT BEDTIME 03/27/16  Yes Baxley, Cresenciano Lick, MD  Cetirizine HCl (ZYRTEC ALLERGY PO) Take 1 tablet by mouth daily as needed (allergies).    Yes [provider]  Lactobacillus (PROBIOTIC ACIDOPHILUS) TABS Take 3 tablets by mouth daily.   Yes [provider]  losartan-hydrochlorothiazide (HYZAAR) 100-25 MG tablet TAKE 1 TABLET BY MOUTH ONCE DAILY 10/30/17  Yes Baxley, Cresenciano Lick, MD  magnesium 30 MG tablet Take 30 mg by mouth daily.   Yes [provider]  metFORMIN (GLUCOPHAGE) 500 MG tablet TAKE ONE TABLET BY MOUTH TWICE DAILY WITH MEALS 07/31/17  Yes Baxley, Cresenciano Lick, MD  oxymetazoline (AFRIN) 0.05 % nasal spray Place 1 spray into both nostrils 2 (  two) times daily as needed for congestion.   Yes [provider]  albuterol (PROVENTIL HFA;VENTOLIN HFA) 108 (90 BASE) MCG/ACT inhaler Inhale 2 puffs into the lungs every 6 (six) hours as needed. Wheezing and shortness of breath 05/29/15   Elby Showers, MD  Continuous Blood Gluc Sensor ([place]) MISC by Does not apply route.    [provider]  fluticasone (FLONASE) 50 MCG/ACT nasal spray USE TWO SPRAY(S) IN EACH NOSTRIL ONCE DAILY Patient taking differently: USE TWO SPRAY(S) IN EACH NOSTRIL ONCE DAILY PRN FOR ALLERGIES 07/11/16   Elby Showers, MD  glucose blood test strip Use once daily to check glucose  readings. 08/20/17   Elby Showers, MD  HYDROcodone-acetaminophen (NORCO/VICODIN) 5-325 MG tablet Take 1 tablet by mouth every 6 (six) hours as needed for moderate pain. 12/12/17   Fredia Sorrow, MD  ibuprofen (ADVIL,MOTRIN) 200 MG tablet Take 400 mg by mouth every 6 (six) hours as needed. For pain    [provider]  levofloxacin (LEVAQUIN) 500 MG tablet Take 1 tablet (500 mg total) by mouth daily. Patient not taking: Reported on 12/12/2017 11/17/17   Elby Showers, MD    Family History Family History  Problem Relation Age of Onset  . Diabetes Mother   . Stroke Mother   . Other Sister   . Colon cancer Sister 48    Social History Social History   Tobacco Use  . Smoking status: Never Smoker  . Smokeless tobacco: Never Used  Substance Use Topics  . Alcohol use: No  . Drug use: No     Allergies   Lisinopril   Review of Systems Review of Systems  Constitutional: Negative for fever.  HENT: Negative for congestion.   Eyes: Negative for visual disturbance.  Respiratory: Negative for shortness of breath.   Cardiovascular: Negative for chest pain.  Gastrointestinal: Negative for abdominal pain.  Genitourinary: Negative for hematuria.  Musculoskeletal: Negative for back pain and neck pain.  Skin: Negative for wound.  Neurological: Negative for headaches.  Hematological: Does not bruise/bleed easily.  Psychiatric/Behavioral: Negative for confusion.     Physical Exam Updated Vital Signs BP (!) 141/88   Pulse (!) 101   Temp 99.1 F (37.3 C) (Oral)   Resp 16   SpO2 98%   Physical Exam  Constitutional: She is oriented to person, place, and time. She appears well-developed and well-nourished. No distress.  HENT:  Head: Normocephalic and atraumatic.  Mouth/Throat: Oropharynx is clear and moist.  Eyes: Conjunctivae and EOM are normal. Pupils are equal, round, and reactive to light.  Neck: Normal range of motion. Neck supple.  Cardiovascular: Normal rate and  regular rhythm.  Pulmonary/Chest: Effort normal and breath sounds normal. No respiratory distress.  Abdominal: Bowel sounds are normal. There is no tenderness.  Musculoskeletal: She exhibits edema and tenderness.  Marked swelling to the left ankle area.  Cap refill distally is less than 1 second.  Sensation is intact.  Movement.  A little bit of proximal fibula tenderness as well.  Neurological: She is alert and oriented to person, place, and time. No cranial nerve deficit or sensory deficit. She exhibits normal muscle tone.  Skin: Skin is warm.  Nursing note and vitals reviewed.    ED Treatments / Results  Labs (all labs ordered are listed, but only abnormal results are displayed) Labs Reviewed - No data to display  EKG  EKG Interpretation None       Radiology Dg Tibia/fibula Left  Result Date: 12/12/2017 CLINICAL DATA:  Left lower leg pain since an injury suffered in a slip and fall while walking in the rain last night. Initial encounter. EXAM: LEFT TIBIA AND FIBULA - 2 VIEW COMPARISON:  None. FINDINGS: Nondisplaced fracture of the distal fibula is identified as seen on plain films of the ankle this same day. No other acute bony or joint abnormality is seen. IMPRESSION: Nondisplaced distal fibular fracture.  Otherwise negative. Electronically Signed   By: Inge Rise M.D.   On: 12/12/2017 15:41   Dg Ankle Complete Left  Result Date: 12/12/2017 CLINICAL DATA:  The patient suffered a slip and fall while walking in the rain last night with onset of ankle pain. Initial encounter. EXAM: LEFT ANKLE COMPLETE - 3+ VIEW COMPARISON:  None. FINDINGS: The patient has a nondisplaced fracture of the lateral malleolus. No other acute bony or joint abnormality is identified. Soft tissue swelling is present about the ankle. IMPRESSION: Nondisplaced lateral malleolus fracture. Electronically Signed   By: Inge Rise M.D.   On: 12/12/2017 15:40   Dg Foot Complete Left  Result Date:  12/12/2017 CLINICAL DATA:  Left foot pain due to an injury suffered in a slip and fall while walking in the rain last night. Initial encounter. EXAM: LEFT FOOT - COMPLETE 3+ VIEW COMPARISON:  None. FINDINGS: Nondisplaced distal fibular fracture is identified as seen on comparison plain films of the ankle this same day. No other acute bony or joint abnormality is identified. Osteoarthritis at the IP joint of the great toe is noted. IMPRESSION: Nondisplaced distal fibular fracture.  No other acute finding. Electronically Signed   By: Inge Rise M.D.   On: 12/12/2017 15:42    Procedures Procedures (including critical care time)  Medications Ordered in ED Medications  HYDROcodone-acetaminophen (NORCO/VICODIN) 5-325 MG per tablet 1 tablet (1 tablet Oral Given 12/12/17 1626)     Initial Impression / Assessment and Plan / ED Course  I have reviewed the triage vital signs and the nursing notes.  Pertinent labs & imaging results that were available during my care of the patient were reviewed by me and considered in my medical decision making (see chart for details).     X-ray rules out proximal fibula fracture but shows a nondisplaced distal fibula fracture.  Patient will be treated with a Cam walker due to her age.  Follow-up with orthopedics.  Elevation and pain medicine.  No other injuries from the fall.  Patient not on blood thinners.  Final Clinical Impressions(s) / ED Diagnoses   Final diagnoses:  Fall, initial encounter  Closed fracture of left ankle, initial encounter    ED Discharge Orders        Ordered    HYDROcodone-acetaminophen (NORCO/VICODIN) 5-325 MG tablet  Every 6 hours PRN     12/12/17 1731       Fredia Sorrow, MD 12/12/17 1735

## 2017-12-30 DIAGNOSIS — S8262XA Displaced fracture of lateral malleolus of left fibula, initial encounter for closed fracture: Secondary | ICD-10-CM | POA: Diagnosis not present

## 2017-12-31 ENCOUNTER — Other Ambulatory Visit: Payer: Self-pay | Admitting: Internal Medicine

## 2018-01-01 ENCOUNTER — Other Ambulatory Visit: Payer: Self-pay

## 2018-01-01 MED ORDER — LOSARTAN POTASSIUM-HCTZ 100-25 MG PO TABS: 1.0000 | ORAL_TABLET | Freq: Every day | ORAL | 3 refills | Status: DC

## 2018-01-27 DIAGNOSIS — S8262XD Displaced fracture of lateral malleolus of left fibula, subsequent encounter for closed fracture with routine healing: Secondary | ICD-10-CM | POA: Diagnosis not present

## 2018-02-12 ENCOUNTER — Other Ambulatory Visit: Payer: Self-pay

## 2018-02-13 MED ORDER — ALPRAZOLAM 0.5 MG PO TABS: 0.5000 mg | ORAL_TABLET | Freq: Two times a day (BID) | ORAL | 1 refills | Status: DC | PRN

## 2018-02-14 ENCOUNTER — Other Ambulatory Visit: Payer: Self-pay | Admitting: Internal Medicine

## 2018-03-09 DIAGNOSIS — S8262XD Displaced fracture of lateral malleolus of left fibula, subsequent encounter for closed fracture with routine healing: Secondary | ICD-10-CM | POA: Diagnosis not present

## 2018-04-09 ENCOUNTER — Ambulatory Visit (INDEPENDENT_AMBULATORY_CARE_PROVIDER_SITE_OTHER): Payer: Medicare Other | Admitting: Internal Medicine

## 2018-04-09 ENCOUNTER — Encounter: Payer: Self-pay | Admitting: Internal Medicine

## 2018-04-09 VITALS — Ht 67.0 in

## 2018-04-09 DIAGNOSIS — J0100 Acute maxillary sinusitis, unspecified: Secondary | ICD-10-CM | POA: Diagnosis not present

## 2018-04-09 MED ORDER — LEVOFLOXACIN 500 MG PO TABS: 500.0000 mg | ORAL_TABLET | Freq: Every day | ORAL | 0 refills | Status: DC

## 2018-04-09 MED ORDER — METHYLPREDNISOLONE ACETATE 80 MG/ML IJ SUSP
80.0000 mg | Freq: Once | INTRAMUSCULAR | Status: AC
  Administered 2018-04-09: 80 mg via INTRAMUSCULAR

## 2018-04-20 NOTE — Patient Instructions (Signed)
Levaquin 500 mg daily for 10 days.  Depo-Medrol 80 mg IM.  Rest and drink plenty of fluids.

## 2018-04-20 NOTE — Progress Notes (Signed)
   Subjective:    Patient ID: Victoria [name], female    DOB: 03-24-1941, 77 y.o.   MRN: [id number]  HPI Patient has issues with sinusitis from time to time.  Recently has come down with another bout.  No fever or chills.  Has nasal congestion and maxillary sinus tenderness.  No fever or shaking chills.    Review of Systems see above     Objective:   Physical Exam  Skin warm and dry.  Nodes none.  Neck is supple.  Chest clear to auscultation.  TMs and pharynx are clear.  She sounds nasally congested.      Assessment & Plan:  Acute maxillary sinusitis  Plan: Levaquin 500 mg daily for 10 days.  Depo-Medrol 80 mg IM

## 2018-05-22 ENCOUNTER — Emergency Department (HOSPITAL_COMMUNITY)
Admission: EM | Admit: 2018-05-22 | Discharge: 2018-05-23 | Disposition: A | Discharge status: Home / Self Care | Payer: Medicare Other | Attending: Emergency Medicine | Admitting: Emergency Medicine

## 2018-05-22 ENCOUNTER — Encounter (HOSPITAL_COMMUNITY): Payer: Self-pay

## 2018-05-22 ENCOUNTER — Emergency Department (HOSPITAL_COMMUNITY): Payer: Medicare Other

## 2018-05-22 ENCOUNTER — Other Ambulatory Visit: Payer: Self-pay

## 2018-05-22 DIAGNOSIS — Z7984 Long term (current) use of oral hypoglycemic drugs: Secondary | ICD-10-CM | POA: Diagnosis not present

## 2018-05-22 DIAGNOSIS — Z9089 Acquired absence of other organs: Secondary | ICD-10-CM | POA: Insufficient documentation

## 2018-05-22 DIAGNOSIS — I639 Cerebral infarction, unspecified: Secondary | ICD-10-CM | POA: Diagnosis not present

## 2018-05-22 DIAGNOSIS — I6523 Occlusion and stenosis of bilateral carotid arteries: Secondary | ICD-10-CM | POA: Diagnosis not present

## 2018-05-22 DIAGNOSIS — R42 Dizziness and giddiness: Secondary | ICD-10-CM | POA: Diagnosis not present

## 2018-05-22 DIAGNOSIS — I10 Essential (primary) hypertension: Secondary | ICD-10-CM | POA: Insufficient documentation

## 2018-05-22 DIAGNOSIS — Z79899 Other long term (current) drug therapy: Secondary | ICD-10-CM | POA: Insufficient documentation

## 2018-05-22 DIAGNOSIS — E119 Type 2 diabetes mellitus without complications: Secondary | ICD-10-CM | POA: Insufficient documentation

## 2018-05-22 DIAGNOSIS — Z853 Personal history of malignant neoplasm of breast: Secondary | ICD-10-CM | POA: Insufficient documentation

## 2018-05-22 LAB — URINALYSIS, ROUTINE W REFLEX MICROSCOPIC
Bilirubin Urine: NEGATIVE
Glucose, UA: NEGATIVE mg/dL
Hgb urine dipstick: NEGATIVE
Ketones, ur: NEGATIVE mg/dL
Leukocytes, UA: NEGATIVE
Nitrite: NEGATIVE
Protein, ur: NEGATIVE mg/dL
Specific Gravity, Urine: 1.014 (ref 1.005–1.030)
pH: 6 (ref 5.0–8.0)

## 2018-05-22 LAB — BASIC METABOLIC PANEL
Anion gap: 8 (ref 5–15)
BUN: 16 mg/dL (ref 6–20)
CO2: 28 mmol/L (ref 22–32)
Calcium: 9.1 mg/dL (ref 8.9–10.3)
Chloride: 103 mmol/L (ref 101–111)
Creatinine, Ser: 0.81 mg/dL (ref 0.44–1.00)
GFR calc Af Amer: >60 mL/min (ref >60)
GFR calc non Af Amer: >60 mL/min (ref >60)
Glucose, Bld: 116 mg/dL — ABNORMAL HIGH (ref 65–99)
Potassium: 3.9 mmol/L (ref 3.5–5.1)
Sodium: 139 mmol/L (ref 135–145)

## 2018-05-22 LAB — CBC WITH DIFFERENTIAL/PLATELET
Basophils Absolute: 0 10*3/uL (ref 0.0–0.1)
Basophils Relative: 0 %
Eosinophils Absolute: 0.1 10*3/uL (ref 0.0–0.7)
Eosinophils Relative: 1 %
HCT: 39.3 % (ref 36.0–46.0)
Hemoglobin: 13.3 g/dL (ref 12.0–15.0)
Lymphocytes Relative: 52 %
Lymphs Abs: 5.2 10*3/uL — ABNORMAL HIGH (ref 0.7–4.0)
MCH: 27.6 pg (ref 26.0–34.0)
MCHC: 33.8 g/dL (ref 30.0–36.0)
MCV: 81.5 fL (ref 78.0–100.0)
Monocytes Absolute: 0.5 10*3/uL (ref 0.1–1.0)
Monocytes Relative: 5 %
Neutro Abs: 4.1 10*3/uL (ref 1.7–7.7)
Neutrophils Relative %: 42 %
Platelets: 216 10*3/uL (ref 150–400)
RBC: 4.82 MIL/uL (ref 3.87–5.11)
RDW: 16.7 % — ABNORMAL HIGH (ref 11.5–15.5)
WBC: 10 10*3/uL (ref 4.0–10.5)

## 2018-05-22 MED ORDER — IBUPROFEN 800 MG PO TABS
800.0000 mg | ORAL_TABLET | Freq: Once | ORAL | Status: AC
  Administered 2018-05-22: 800 mg via ORAL
  Filled 2018-05-22: qty 1

## 2018-05-22 MED ORDER — IOPAMIDOL (ISOVUE-370) INJECTION 76%
100.0000 mL | Freq: Once | INTRAVENOUS | Status: AC | PRN
  Administered 2018-05-22: 100 mL via INTRAVENOUS

## 2018-05-22 MED ORDER — MECLIZINE HCL 25 MG PO TABS
25.0000 mg | ORAL_TABLET | Freq: Once | ORAL | Status: AC
  Administered 2018-05-22: 25 mg via ORAL
  Filled 2018-05-22: qty 1

## 2018-05-22 MED ORDER — LORAZEPAM 2 MG/ML IJ SOLN
0.5000 mg | Freq: Once | INTRAMUSCULAR | Status: AC
  Administered 2018-05-22: 0.5 mg via INTRAVENOUS
  Filled 2018-05-22: qty 1

## 2018-05-22 MED ORDER — IOPAMIDOL (ISOVUE-370) INJECTION 76%
INTRAVENOUS | Status: AC
  Filled 2018-05-22: qty 100

## 2018-05-22 NOTE — ED Notes (Signed)
Pt requesting something for pain that is not Tylenol

## 2018-05-22 NOTE — ED Provider Notes (Signed)
[facility] DEPT Provider Note   CSN: [id number] Arrival date & time: 05/22/18  1417     History   Chief Complaint Chief Complaint  Patient presents with  . Dizziness    HPI Victoria [name] is a 77 y.o. female who presents the emergency department chief complaint of vertigo.  Patient states that she had onset of symptoms for the first time back in November when she was on a cruise.  She was diagnosed by the ship's doctor with vertigo.  She had associated vomiting for about 2 days and was treated with meclizine.  She says that her dizziness and ataxia symptoms never resolved however she has not had an vomiting since that time.  Over the past 7 months she has had progressively worsening dizziness and ataxia.  She states she is able to walk but only if she is holding onto objects to help her.  She has not been taking any medications and did not see her PCP regarding these symptoms.  She states that tonight she "just could not take it anymore."  She denies any other neurologic abnormalities.  She denies headache, unilateral weakness.  She is concerned about herself  because her sister was recently diagnosed with a meningioma.  HPI  Past Medical History:  Diagnosis Date  . Abnormal finding on Pap smear   . Breast cancer ([place])   . Cholelithiasis 11-2012  . Cirrhosis ([place]) 11-2012  . Diabetes mellitus   . Gallstones   . Hepatitis C 11-2012   Active  . Hypertension   . Obesity   . Pancreatitis, acute 11-2012  . Thrombocytopenia ([place]) 11-2012    Patient Active Problem List   Diagnosis Date Noted  . History of pancreatitis 05/07/2013  . Status post cholecystectomy 05/07/2013  . Hepatitis C, chronic ([place]) 01/19/2013  . Thrombocytopenia ([place]) 12/14/2012  . Cirrhosis ([place]) 12/14/2012  . Allergic rhinitis 10/05/2012  . Diabetes mellitus ([place]) 10/07/2011  . Hypertension 10/07/2011  . History of breast cancer 10/07/2011  . Anxiety 10/07/2011    Past  Surgical History:  Procedure Laterality Date  . BREAST LUMPECTOMY  right  . BREAST LUMPECTOMY    . BREAST REDUCTION SURGERY  2005  . CATARACT EXTRACTION, BILATERAL    . CHOLECYSTECTOMY N/A 03/30/2013   Procedure: LAPAROSCOPIC CHOLECYSTECTOMY WITH INTRAOPERATIVE CHOLANGIOGRAM;  Surgeon: Shann Medal, MD;  Location: [facility];  Service: General;  Laterality: N/A;  . EUS  01/28/2013   Procedure: UPPER ENDOSCOPIC ULTRASOUND (EUS) LINEAR;  Surgeon: Milus Banister, MD;  Location: [facility];  Service: Endoscopy;  Laterality: N/A;  ride-adrian  [place] [phone]  . EXCISION OF SKIN TAG Right 03/30/2013   Procedure: EXCISION OF SKIN TAG;  Surgeon: Shann Medal, MD;  Location: [facility];  Service: General;  Laterality: Right;  . LIVER BIOPSY  03/30/2013   Procedure: LIVER BIOPSY;  Surgeon: Shann Medal, MD;  Location: [facility];  Service: General;;  . THYROIDECTOMY, PARTIAL  1972   left  . vertigo       OB History   None      Home Medications    Prior to Admission medications   Medication Sig Start Date End Date Taking? Authorizing Provider  ALPRAZolam [name]) 0.5 MG tablet Take 1 tablet (0.5 mg total) by mouth 2 (two) times daily as needed for anxiety. 02/13/18   Elby Showers, MD  amLODipine (NORVASC) 2.5 MG tablet TAKE ONE TABLET BY MOUTH AT BEDTIME 03/27/16   Elby Showers,  MD  amLODipine (NORVASC) 2.5 MG tablet TAKE 1 TABLET BY MOUTH ONCE DAILY AT BEDTIME 01/01/18   Elby Showers, MD  Cetirizine HCl (ZYRTEC ALLERGY PO) Take 1 tablet by mouth daily as needed (allergies).     [provider]  Continuous Blood Gluc Sensor ([place]) MISC by Does not apply route.    [provider]  fluticasone (FLONASE) 50 MCG/ACT nasal spray USE TWO SPRAY(S) IN EACH NOSTRIL ONCE DAILY Patient taking differently: USE TWO SPRAY(S) IN EACH NOSTRIL ONCE DAILY PRN FOR ALLERGIES 07/11/16   Elby Showers, MD  glucose blood test strip Use once daily to check glucose readings.  08/20/17   Elby Showers, MD  ibuprofen (ADVIL,MOTRIN) 200 MG tablet Take 400 mg by mouth every 6 (six) hours as needed. For pain    [provider]  Lactobacillus (PROBIOTIC ACIDOPHILUS) TABS Take 3 tablets by mouth daily.    [provider]  levofloxacin (LEVAQUIN) 500 MG tablet Take 1 tablet (500 mg total) by mouth daily. 11/17/17   Elby Showers, MD  levofloxacin (LEVAQUIN) 500 MG tablet Take 1 tablet (500 mg total) by mouth daily. 04/09/18   Elby Showers, MD  losartan-hydrochlorothiazide (HYZAAR) 100-25 MG tablet Take 1 tablet by mouth daily. 01/01/18   Elby Showers, MD  magnesium 30 MG tablet Take 30 mg by mouth daily.    [provider]  metFORMIN (GLUCOPHAGE) 500 MG tablet TAKE 1 TABLET BY MOUTH TWICE DAILY WITH MEALS 02/14/18   Elby Showers, MD  oxymetazoline (AFRIN) 0.05 % nasal spray Place 1 spray into both nostrils 2 (two) times daily as needed for congestion.    [provider]    Family History Family History  Problem Relation Age of Onset  . Diabetes Mother   . Stroke Mother   . Other Sister   . Colon cancer Sister 52    Social History Social History   Tobacco Use  . Smoking status: Never Smoker  . Smokeless tobacco: Never Used  Substance Use Topics  . Alcohol use: No  . Drug use: No     Allergies   Lisinopril   Review of Systems Review of Systems Ten systems reviewed and are negative for acute change, except as noted in the HPI.    Physical Exam Updated Vital Signs BP (!) 140/94 (BP Location: Right Arm)   Pulse 81   Temp 98.5 F (36.9 C) (Oral)   Resp 12   Ht 5\' 8"  (1.727 m)   Wt 99.8 kg (220 lb)   SpO2 100%   BMI 33.45 kg/m   Physical Exam  Constitutional: She is oriented to person, place, and time. She appears well-developed and well-nourished. No distress.  HENT:  Head: Normocephalic and atraumatic.  Eyes: Conjunctivae are normal. No scleral icterus.  Patient does not have nystagmus, but has  difficulty with eye tracking.  Neck: Normal range of motion.  Cardiovascular: Normal rate, regular rhythm and normal heart sounds. Exam reveals no gallop and no friction rub.  No murmur heard. Pulmonary/Chest: Effort normal and breath sounds normal. No respiratory distress.  Abdominal: Soft. Bowel sounds are normal. She exhibits no distension and no mass. There is no tenderness. There is no guarding.  Neurological: She is alert and oriented to person, place, and time. No cranial nerve deficit. Coordination normal.  Skin: Skin is warm and dry. She is not diaphoretic.  Psychiatric: Her behavior is normal.  Nursing note and vitals reviewed.  ED Treatments / Results  Labs (all labs ordered are listed, but only abnormal results are displayed) Labs Reviewed - No data to display  EKG None  Radiology No results found.  Procedures Procedures (including critical care time)  Medications Ordered in ED Medications - No data to display   Initial Impression / Assessment and Plan / ED Course  I have reviewed the triage vital signs and the nursing notes.  Pertinent labs & imaging results that were available during my care of the patient were reviewed by me and considered in my medical decision making (see chart for details).  Clinical Course as of May 23 2115  Fri May 22, 2018  2113 MR BRAIN WO CONTRAST [KT]    Clinical Course User Index [KT] Wynonia Sours    Given the patient is signed out to [place] who will assume care.  CT NGO images are currently pending.  If stenotic vessels are found patient may need to be kept inpatient.  Otherwise she should have close follow-up with outpatient neurology this coming with 8-week, meclizine and discharge if her ambulatory status is safe.  I have discussed the findings of the MRI with the patient.  Final Clinical Impressions(s) / ED Diagnoses   Final diagnoses:  Vertigo  Cerebellar infarct [facility])    ED Discharge Orders      None       Margarita Mail, PA-C 05/22/18 2318    Lacretia Leigh, MD 05/23/18 [phone]

## 2018-05-22 NOTE — ED Notes (Signed)
Patient transported to MRI 

## 2018-05-22 NOTE — ED Notes (Signed)
Patient transported to CT 

## 2018-05-22 NOTE — ED Triage Notes (Signed)
Patient reports that she was diagnosed with vertigo in November/2018. Patient states the medication she was prescribed worked for a "a little while.", but now it is not working.

## 2018-05-23 MED ORDER — MECLIZINE HCL 25 MG PO TABS: 25.0000 mg | ORAL_TABLET | Freq: Two times a day (BID) | ORAL | 0 refills | Status: DC | PRN

## 2018-05-23 NOTE — Discharge Instructions (Signed)
The neurologist will contact you.

## 2018-05-23 NOTE — ED Provider Notes (Signed)
CTs negative.  Neurology follow-up per plan.     Montine Circle, PA-C 05/23/18 0040    Palumbo, April, MD 05/23/18 0120

## 2018-06-09 DIAGNOSIS — S8262XD Displaced fracture of lateral malleolus of left fibula, subsequent encounter for closed fracture with routine healing: Secondary | ICD-10-CM | POA: Diagnosis not present

## 2018-07-27 ENCOUNTER — Encounter

## 2018-07-27 ENCOUNTER — Encounter: Payer: Self-pay | Admitting: Neurology

## 2018-07-27 ENCOUNTER — Ambulatory Visit (INDEPENDENT_AMBULATORY_CARE_PROVIDER_SITE_OTHER): Payer: Medicare Other | Admitting: Neurology

## 2018-07-27 VITALS — BP 140/88 | HR 72 | Ht 68.0 in | Wt 211.5 lb

## 2018-07-27 DIAGNOSIS — I639 Cerebral infarction, unspecified: Secondary | ICD-10-CM | POA: Diagnosis not present

## 2018-07-27 DIAGNOSIS — J323 Chronic sphenoidal sinusitis: Secondary | ICD-10-CM

## 2018-07-27 DIAGNOSIS — R42 Dizziness and giddiness: Secondary | ICD-10-CM

## 2018-07-27 NOTE — Progress Notes (Signed)
Reason for visit: Dizziness  Referring physician: Dr. Lillie Fragmin is a 77 y.o. female  History of present illness:  Victoria [name] is a 77 year old right-handed black female with a history of chronic sphenoid sinusitis and a history of diabetes.  The patient indicates that when she was on a cruise in October 2018 she awakened with severe vertigo associated with nausea and vomiting.  The patient claims that the severe vertigo was fairly brief, but she had some gait instability that lasted the entire duration of her 7-day cruise.  The patient took meclizine with some benefit, but she could not continue this because of the drowsiness effects of the medication.  The patient has had vertigo such as this several years ago that also was relatively brief.  Since the onset of the vertigo, the patient has had some chronic slight sensation of dizziness that occurs only with standing, and goes away with sitting or lying down.  The patient reports no vertigo, she does not have nausea or vomiting.  She indicates that the dizziness sensation is worse when her sinuses feel congested and she has pressure sensations.  The patient oftentimes her symptoms that are worse in the morning, better as the day goes on.  If she reaches out and touches something she may feel better with the dizziness.  She does have some numbness in the feet associated with her diabetes.  The patient denies any blackout episodes, she has no focal numbness or weakness of the arms or legs with exception of numbness of the feet.  Xanax seems to help her dizziness at times.  She denies issues controlling the bowels or the bladder.  The patient does not have ear pain or change in hearing, she denies any new medications that were added or subtracted or any dose changes that occurred around the time of onset of dizziness.  She comes to this office for an evaluation.  A recent MRI of the brain did show acute on chronic sinusitis affecting the  right sphenoid sinus.  The patient was on antibiotics for sinusitis in April 2019.  Past Medical History:  Diagnosis Date  . Abnormal finding on Pap smear   . Breast cancer ([place])   . Cholelithiasis 11-2012  . Cirrhosis ([place]) 11-2012  . Diabetes mellitus   . Gallstones   . Hepatitis C 11-2012   Active  . Hypertension   . Obesity   . Pancreatitis, acute 11-2012  . Thrombocytopenia ([place]) 11-2012    Past Surgical History:  Procedure Laterality Date  . BREAST LUMPECTOMY  right  . BREAST LUMPECTOMY    . BREAST REDUCTION SURGERY  2005  . CATARACT EXTRACTION, BILATERAL    . CHOLECYSTECTOMY N/A 03/30/2013   Procedure: LAPAROSCOPIC CHOLECYSTECTOMY WITH INTRAOPERATIVE CHOLANGIOGRAM;  Surgeon: Shann Medal, MD;  [facility];  Service: General;  Laterality: N/A;  . EUS  01/28/2013   Procedure: UPPER ENDOSCOPIC ULTRASOUND (EUS) LINEAR;  Surgeon: Milus Banister, MD;  [facility];  Service: Endoscopy;  Laterality: N/A;  ride-adrian  [place] [phone]  . EXCISION OF SKIN TAG Right 03/30/2013   Procedure: EXCISION OF SKIN TAG;  Surgeon: Shann Medal, MD;  [facility];  Service: General;  Laterality: Right;  . LIVER BIOPSY  03/30/2013   Procedure: LIVER BIOPSY;  Surgeon: Shann Medal, MD;  Location[facility];  Service: General;;  . THYROIDECTOMY, PARTIAL  1972   left  . vertigo      Family History  Problem Relation Age of Onset  . Diabetes Mother   . Stroke Mother   . Other Sister   . Colon cancer Sister 20    Social history:  reports that she has never smoked. She has never used smokeless tobacco. She reports that she does not drink alcohol or use drugs.  Medications:  Prior to Admission medications   Medication Sig Start Date End Date Taking? Authorizing Provider  acetaminophen (TYLENOL) 500 MG tablet Take 1,000 mg by mouth every 6 (six) hours as needed for mild pain, moderate pain or headache.    Yes [provider]  ALPRAZolam (XANAX) 0.5 MG tablet  Take 1 tablet (0.5 mg total) by mouth 2 (two) times daily as needed for anxiety. Patient taking differently: Take 0.5 mg by mouth 2 (two) times daily.  02/13/18  Yes Baxley, Cresenciano Lick, MD  amLODipine (NORVASC) 2.5 MG tablet TAKE 1 TABLET BY MOUTH ONCE DAILY AT BEDTIME 01/01/18  Yes Baxley, Cresenciano Lick, MD  Cetirizine HCl (ZYRTEC ALLERGY PO) Take 1 tablet by mouth daily as needed (allergies).    Yes [provider]  Continuous Blood Gluc Sensor ([place]) MISC by Does not apply route.   Yes [provider]  fluticasone (FLONASE) 50 MCG/ACT nasal spray USE TWO SPRAY(S) IN EACH NOSTRIL ONCE DAILY Patient taking differently: USE TWO SPRAY(S) IN EACH NOSTRIL ONCE DAILY PRN FOR ALLERGIES 07/11/16  Yes Baxley, Cresenciano Lick, MD  glucose blood test strip Use once daily to check glucose readings. 08/20/17  Yes Baxley, Cresenciano Lick, MD  levofloxacin (LEVAQUIN) 500 MG tablet Take 1 tablet (500 mg total) by mouth daily. 04/09/18  Yes Baxley, Cresenciano Lick, MD  losartan-hydrochlorothiazide (HYZAAR) 100-25 MG tablet Take 1 tablet by mouth daily. 01/01/18  Yes Baxley, Cresenciano Lick, MD  metFORMIN (GLUCOPHAGE) 500 MG tablet TAKE 1 TABLET BY MOUTH TWICE DAILY WITH MEALS 02/14/18  Yes Baxley, Cresenciano Lick, MD  oxymetazoline (AFRIN) 0.05 % nasal spray Place 1 spray into both nostrils 2 (two) times daily as needed for congestion.   Yes [provider]  meclizine (ANTIVERT) 25 MG tablet Take 1 tablet (25 mg total) by mouth 2 (two) times daily as needed for dizziness. Patient not taking: Reported on 07/27/2018 05/23/18   Montine Circle, PA-C      Allergies  Allergen Reactions  . Lisinopril Cough    ROS:  Out of a complete 14 system review of symptoms, the patient complains only of the following symptoms, and all other reviewed systems are negative.  Dizziness  Blood pressure 140/88, pulse 72, height 5\' 8"  (1.727 m), weight 211 lb 8 oz (95.9 kg).   Blood pressure, sitting, right arm is 128/80.  Blood pressure,  right arm, standing is 112/80.  Physical Exam  General: The patient is alert and cooperative at the time of the examination.  Eyes: Pupils are equal, round, and reactive to light. Discs are flat bilaterally.  Ears: Tympanic membranes are clear bilaterally.  Neck: The neck is supple, no carotid bruits are noted.  Respiratory: The respiratory examination is clear.  Cardiovascular: The cardiovascular examination reveals a regular rate and rhythm, no obvious murmurs or rubs are noted.  Skin: Extremities are without significant edema.  Neurologic Exam  Mental status: The patient is alert and oriented x 3 at the time of the examination. The patient has apparent normal recent and remote memory, with an apparently normal attention span and concentration ability.  Cranial nerves: Facial symmetry is present. There is good  sensation of the face to pinprick and soft touch bilaterally. The strength of the facial muscles and the muscles to head turning and shoulder shrug are normal bilaterally. Speech is well enunciated, no aphasia or dysarthria is noted. Extraocular movements are full. Visual fields are full. The tongue is midline, and the patient has symmetric elevation of the soft palate. No obvious hearing deficits are noted.  Motor: The motor testing reveals 5 over 5 strength of all 4 extremities. Good symmetric motor tone is noted throughout.  Sensory: Sensory testing is intact to pinprick, soft touch, vibration sensation, and position sense on all 4 extremities, with exception of a stocking pattern pinprick sensory deficit in the distal third of the legs bilaterally. No evidence of extinction is noted.  Coordination: Cerebellar testing reveals good finger-nose-finger and heel-to-shin bilaterally.  Gait and station: Gait is normal. Tandem gait is normal. Romberg is negative. No drift is seen.  Reflexes: Deep tendon reflexes are symmetric and normal bilaterally. Toes are downgoing  bilaterally.   MRI brain 05/22/18:  IMPRESSION: 1. No acute intracranial abnormality. 2. Few tiny remote right cerebellar infarcts. 3. Mild for age chronic microvascular ischemic disease. 4. Chronic sphenoid sinusitis. Superimposed air-fluid level within the right sphenoid sinus suspicious for acute on chronic disease.  * MRI scan images were reviewed online. I agree with the written report.   CTA head and neck 05/22/18:  IMPRESSION: 1. Negative CTA of the head and neck. No large vessel occlusion. No high-grade or correctable stenosis. 2. Widely patent vertebrobasilar system with no structural findings to suggest vertebrobasilar insufficiency. 3. Mild for age atherosclerotic change about the carotid bifurcations and carotid siphons without stenosis.    Assessment/Plan:  1.  Episodic vertigo  2.  Chronic dizziness  3.  Bilateral chronic sphenoid sinusitis  The patient has postural dizziness at this time, she does not appear to be orthostatic on clinical examination.  The patient does have evidence of a mild peripheral neuropathy which may give a sensation of imbalance with standing.  The patient however reports some correlation between her sinus symptoms and the severity of the dizziness.  MRI evaluation and CT angiogram evaluation were relatively unremarkable, the patient does not appear to have cerebrovascular disease as a etiology of her symptoms.  The patient will have a referral to ENT physician to evaluate the chronic sinusitis.  The patient will follow-up through this office if needed.  Jill Alexanders MD 07/27/2018 8:24 AM  [facility] [address] [place], [place] [address]  Phone [phone] Fax [phone]

## 2018-07-27 NOTE — Patient Instructions (Signed)
   We will set up a referral to ENT.

## 2018-07-31 ENCOUNTER — Other Ambulatory Visit: Payer: Self-pay | Admitting: Internal Medicine

## 2018-07-31 NOTE — Telephone Encounter (Signed)
Medicare is asking for diabetic labs and 6 month recheck on her. Her CPE is booked for October but that is too early as CPE was done in November last year. Book 6 month recheck next week with [facility] and microalbumin. Cancel Oct CPE and book for November

## 2018-07-31 NOTE — Telephone Encounter (Signed)
Left detailed message.  Patient to call back to make appt next week and to reschedule CPE.

## 2018-08-04 ENCOUNTER — Ambulatory Visit (INDEPENDENT_AMBULATORY_CARE_PROVIDER_SITE_OTHER): Payer: Medicare Other | Admitting: Internal Medicine

## 2018-08-04 ENCOUNTER — Encounter: Payer: Self-pay | Admitting: Internal Medicine

## 2018-08-04 ENCOUNTER — Other Ambulatory Visit: Payer: Self-pay | Admitting: Internal Medicine

## 2018-08-04 VITALS — BP 110/70 | HR 63 | Ht 68.0 in | Wt 215.0 lb

## 2018-08-04 DIAGNOSIS — Z8619 Personal history of other infectious and parasitic diseases: Secondary | ICD-10-CM | POA: Diagnosis not present

## 2018-08-04 DIAGNOSIS — I10 Essential (primary) hypertension: Secondary | ICD-10-CM

## 2018-08-04 DIAGNOSIS — H8110 Benign paroxysmal vertigo, unspecified ear: Secondary | ICD-10-CM | POA: Diagnosis not present

## 2018-08-04 DIAGNOSIS — I639 Cerebral infarction, unspecified: Secondary | ICD-10-CM | POA: Diagnosis not present

## 2018-08-04 DIAGNOSIS — F411 Generalized anxiety disorder: Secondary | ICD-10-CM

## 2018-08-04 DIAGNOSIS — E119 Type 2 diabetes mellitus without complications: Secondary | ICD-10-CM | POA: Diagnosis not present

## 2018-08-04 MED ORDER — AMLODIPINE BESYLATE 2.5 MG PO TABS: 2.5000 mg | ORAL_TABLET | Freq: Every day | ORAL | 0 refills | Status: DC

## 2018-08-04 MED ORDER — LOSARTAN POTASSIUM-HCTZ 100-25 MG PO TABS: 1.0000 | ORAL_TABLET | Freq: Every day | ORAL | 3 refills | Status: DC

## 2018-08-04 NOTE — Progress Notes (Signed)
   Subjective:    Patient ID: Janann August, female    DOB: 27-Nov-1941, 77 y.o.   MRN: [id number]  HPI For follow up of medical issues.  History of controlled type 2 diabetes and essential hypertension.  He has been having recurrent issues with vertigo.  Would like to see neurologist.  He was treated for acute maxillary sinusitis in April.  Subsequently was seen late May in the emergency department with vertigo.  Initially the vertigo started in November when she was on a cruise.  Patient complained of worsening dizziness and ataxia.  She had CT angios of neck and head.  She had chronic right cerebellar infarcts.  She had chronic sphenoid sinusitis.  No stenosis of carotids.  Hemoglobin A1c is 5.9% and previously was 5.7%.  Basic metabolic panel is within normal limits. .   Review of Systems see above     Objective:   Physical Exam Skin warm and dry.  Nodes none.  Neck is supple without JVD thyromegaly or carotid bruits.       Assessment & Plan:  Persistent vertigo-to see neurologist in the near future.  Has been treated with meclizine.  Impaired glucose tolerance treated with diet and metformin  Essential hypertension treated with amlodipine and losartan HCTZ  History of chronic hepatitis C status post treatment and stable  History of anxiety treated with Xanax which also works for vertigo  Plan: Await neurology evaluation.  Continue same medications and follow-up in December, physical exam.

## 2018-08-05 LAB — BASIC METABOLIC PANEL
BUN: 18 mg/dL (ref 7–25)
CO2: 29 mmol/L (ref 20–32)
Calcium: 9.8 mg/dL (ref 8.6–10.4)
Chloride: 99 mmol/L (ref 98–110)
Creat: 0.8 mg/dL (ref 0.60–0.93)
Glucose, Bld: 113 mg/dL — ABNORMAL HIGH (ref 65–99)
Potassium: 4.1 mmol/L (ref 3.5–5.3)
Sodium: 140 mmol/L (ref 135–146)

## 2018-08-05 LAB — HEMOGLOBIN A1C
Hgb A1c MFr Bld: 5.9 % of total Hgb — ABNORMAL HIGH (ref <5.7)
Mean Plasma Glucose: 123 (calc)
eAG (mmol/L): 6.8 (calc)

## 2018-08-07 ENCOUNTER — Other Ambulatory Visit: Payer: Self-pay

## 2018-08-07 DIAGNOSIS — E119 Type 2 diabetes mellitus without complications: Secondary | ICD-10-CM

## 2018-08-07 MED ORDER — GLUCOSE BLOOD VI STRP: ORAL_STRIP | 5 refills | Status: DC

## 2018-08-17 DIAGNOSIS — R42 Dizziness and giddiness: Secondary | ICD-10-CM | POA: Diagnosis not present

## 2018-08-17 DIAGNOSIS — J329 Chronic sinusitis, unspecified: Secondary | ICD-10-CM | POA: Diagnosis not present

## 2018-08-17 DIAGNOSIS — J302 Other seasonal allergic rhinitis: Secondary | ICD-10-CM | POA: Diagnosis not present

## 2018-08-17 DIAGNOSIS — H903 Sensorineural hearing loss, bilateral: Secondary | ICD-10-CM | POA: Diagnosis not present

## 2018-08-22 NOTE — Patient Instructions (Signed)
Await neurology evaluation for vertigo.  Continue same medications and return in December.

## 2018-09-04 ENCOUNTER — Other Ambulatory Visit: Payer: Self-pay | Admitting: Internal Medicine

## 2018-09-12 DIAGNOSIS — Z23 Encounter for immunization: Secondary | ICD-10-CM | POA: Diagnosis not present

## 2018-10-01 ENCOUNTER — Other Ambulatory Visit: Payer: Medicare Other | Admitting: Internal Medicine

## 2018-10-06 ENCOUNTER — Encounter: Payer: Medicare Other | Admitting: Internal Medicine

## 2018-10-30 ENCOUNTER — Other Ambulatory Visit: Payer: Self-pay

## 2018-10-30 MED ORDER — LOSARTAN POTASSIUM 100 MG PO TABS: 100.0000 mg | ORAL_TABLET | Freq: Every day | ORAL | 0 refills | Status: DC

## 2018-10-30 MED ORDER — HYDROCHLOROTHIAZIDE 25 MG PO TABS: 25.0000 mg | ORAL_TABLET | Freq: Every day | ORAL | 0 refills | Status: DC

## 2018-11-17 ENCOUNTER — Other Ambulatory Visit: Payer: Self-pay | Admitting: Internal Medicine

## 2018-11-17 DIAGNOSIS — D696 Thrombocytopenia, unspecified: Secondary | ICD-10-CM

## 2018-11-17 DIAGNOSIS — I10 Essential (primary) hypertension: Secondary | ICD-10-CM

## 2018-11-17 DIAGNOSIS — F419 Anxiety disorder, unspecified: Secondary | ICD-10-CM

## 2018-11-17 DIAGNOSIS — E119 Type 2 diabetes mellitus without complications: Secondary | ICD-10-CM

## 2018-11-17 DIAGNOSIS — H8110 Benign paroxysmal vertigo, unspecified ear: Secondary | ICD-10-CM

## 2018-11-17 DIAGNOSIS — Z0000 Encounter for general adult medical examination without abnormal findings: Secondary | ICD-10-CM

## 2018-11-18 ENCOUNTER — Other Ambulatory Visit: Payer: Self-pay

## 2018-11-18 MED ORDER — LOSARTAN POTASSIUM-HCTZ 100-25 MG PO TABS: 1.0000 | ORAL_TABLET | Freq: Every day | ORAL | 3 refills | Status: DC

## 2018-11-23 ENCOUNTER — Other Ambulatory Visit (INDEPENDENT_AMBULATORY_CARE_PROVIDER_SITE_OTHER): Payer: Medicare Other | Admitting: Internal Medicine

## 2018-11-23 DIAGNOSIS — H8110 Benign paroxysmal vertigo, unspecified ear: Secondary | ICD-10-CM

## 2018-11-23 DIAGNOSIS — E119 Type 2 diabetes mellitus without complications: Secondary | ICD-10-CM | POA: Diagnosis not present

## 2018-11-23 DIAGNOSIS — I10 Essential (primary) hypertension: Secondary | ICD-10-CM

## 2018-11-23 DIAGNOSIS — F419 Anxiety disorder, unspecified: Secondary | ICD-10-CM | POA: Diagnosis not present

## 2018-11-23 DIAGNOSIS — Z0000 Encounter for general adult medical examination without abnormal findings: Secondary | ICD-10-CM

## 2018-11-23 DIAGNOSIS — D696 Thrombocytopenia, unspecified: Secondary | ICD-10-CM

## 2018-11-23 LAB — POCT URINALYSIS DIPSTICK
Appearance: NEGATIVE
Bilirubin, UA: NEGATIVE
Blood, UA: NEGATIVE
Glucose, UA: NEGATIVE
Ketones, UA: NEGATIVE
Leukocytes, UA: NEGATIVE
Nitrite, UA: NEGATIVE
Odor: NEGATIVE
Protein, UA: NEGATIVE
Spec Grav, UA: 1.015 (ref 1.010–1.025)
Urobilinogen, UA: 0.2 E.U./dL
pH, UA: 6.5 (ref 5.0–8.0)

## 2018-11-24 ENCOUNTER — Encounter: Payer: Self-pay | Admitting: Internal Medicine

## 2018-11-24 ENCOUNTER — Ambulatory Visit (INDEPENDENT_AMBULATORY_CARE_PROVIDER_SITE_OTHER): Payer: Medicare Other | Admitting: Internal Medicine

## 2018-11-24 VITALS — BP 130/80 | HR 64 | Ht 68.0 in | Wt 212.0 lb

## 2018-11-24 DIAGNOSIS — Z0000 Encounter for general adult medical examination without abnormal findings: Secondary | ICD-10-CM | POA: Diagnosis not present

## 2018-11-24 DIAGNOSIS — F411 Generalized anxiety disorder: Secondary | ICD-10-CM | POA: Diagnosis not present

## 2018-11-24 DIAGNOSIS — Z853 Personal history of malignant neoplasm of breast: Secondary | ICD-10-CM | POA: Diagnosis not present

## 2018-11-24 DIAGNOSIS — E119 Type 2 diabetes mellitus without complications: Secondary | ICD-10-CM

## 2018-11-24 DIAGNOSIS — Z8619 Personal history of other infectious and parasitic diseases: Secondary | ICD-10-CM

## 2018-11-24 DIAGNOSIS — I639 Cerebral infarction, unspecified: Secondary | ICD-10-CM

## 2018-11-24 DIAGNOSIS — I10 Essential (primary) hypertension: Secondary | ICD-10-CM | POA: Diagnosis not present

## 2018-11-24 DIAGNOSIS — Z9009 Acquired absence of other part of head and neck: Secondary | ICD-10-CM | POA: Diagnosis not present

## 2018-11-24 DIAGNOSIS — E890 Postprocedural hypothyroidism: Secondary | ICD-10-CM

## 2018-11-24 DIAGNOSIS — Z23 Encounter for immunization: Secondary | ICD-10-CM

## 2018-11-24 DIAGNOSIS — H8110 Benign paroxysmal vertigo, unspecified ear: Secondary | ICD-10-CM | POA: Diagnosis not present

## 2018-11-24 DIAGNOSIS — H903 Sensorineural hearing loss, bilateral: Secondary | ICD-10-CM

## 2018-11-24 LAB — CBC WITH DIFFERENTIAL/PLATELET
Basophils Absolute: 12 cells/uL (ref 0–200)
Basophils Relative: 0.2 %
Eosinophils Absolute: 130 cells/uL (ref 15–500)
Eosinophils Relative: 2.2 %
HCT: 36.2 % (ref 35.0–45.0)
Hemoglobin: 11.8 g/dL (ref 11.7–15.5)
Lymphs Abs: 2856 cells/uL (ref 850–3900)
MCH: 27.1 pg (ref 27.0–33.0)
MCHC: 32.6 g/dL (ref 32.0–36.0)
MCV: 83 fL (ref 80.0–100.0)
MPV: 9.7 fL (ref 7.5–12.5)
Monocytes Relative: 5.4 %
Neutro Abs: 2584 cells/uL (ref 1500–7800)
Neutrophils Relative %: 43.8 %
Platelets: 190 10*3/uL (ref 140–400)
RBC: 4.36 10*6/uL (ref 3.80–5.10)
RDW: 13.6 % (ref 11.0–15.0)
Total Lymphocyte: 48.4 %
WBC mixed population: 319 cells/uL (ref 200–950)
WBC: 5.9 10*3/uL (ref 3.8–10.8)

## 2018-11-24 LAB — LIPID PANEL
Cholesterol: 136 mg/dL (ref <200)
HDL: 49 mg/dL — ABNORMAL LOW (ref >50)
LDL Cholesterol (Calc): 72 mg/dL (calc)
Non-HDL Cholesterol (Calc): 87 mg/dL (calc) (ref <130)
Total CHOL/HDL Ratio: 2.8 (calc) (ref <5.0)
Triglycerides: 69 mg/dL (ref <150)

## 2018-11-24 LAB — COMPLETE METABOLIC PANEL WITH GFR
AG Ratio: 1.4 (calc) (ref 1.0–2.5)
ALT: 7 U/L (ref 6–29)
AST: 13 U/L (ref 10–35)
Albumin: 3.9 g/dL (ref 3.6–5.1)
Alkaline phosphatase (APISO): 55 U/L (ref 33–130)
BUN: 18 mg/dL (ref 7–25)
CO2: 29 mmol/L (ref 20–32)
Calcium: 9 mg/dL (ref 8.6–10.4)
Chloride: 105 mmol/L (ref 98–110)
Creat: 0.83 mg/dL (ref 0.60–0.93)
GFR, Est African American: 80 mL/min/{1.73_m2} (ref >=60)
GFR, Est Non African American: 69 mL/min/{1.73_m2} (ref >=60)
Globulin: 2.7 g/dL (calc) (ref 1.9–3.7)
Glucose, Bld: 87 mg/dL (ref 65–99)
Potassium: 3.9 mmol/L (ref 3.5–5.3)
Sodium: 142 mmol/L (ref 135–146)
Total Bilirubin: 0.4 mg/dL (ref 0.2–1.2)
Total Protein: 6.6 g/dL (ref 6.1–8.1)

## 2018-11-24 LAB — MICROALBUMIN / CREATININE URINE RATIO
Creatinine, Urine: 134 mg/dL (ref 20–275)
Microalb Creat Ratio: 10 mcg/mg creat (ref <30)
Microalb, Ur: 1.3 mg/dL

## 2018-11-24 LAB — HEMOGLOBIN A1C
Hgb A1c MFr Bld: 6.1 % of total Hgb — ABNORMAL HIGH (ref <5.7)
Mean Plasma Glucose: 128 (calc)
eAG (mmol/L): 7.1 (calc)

## 2018-11-24 LAB — TSH: TSH: 0.75 mIU/L (ref 0.40–4.50)

## 2018-11-24 NOTE — Progress Notes (Signed)
Subjective:    Patient ID: Victoria [name], female    DOB: Oct 04, 1941, 77 y.o.   MRN: [id number]  HPI  77 year old Female for 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 Jannifer Franklin for vertigo symptoms and referred to ENT Treated by ENT with Prednisone and Augmentin for chronic sphenoid sinusitis. Did not have sinus CT. Hearing checked showed mild to moderate sensorineural hearing loss.  She has a history of diabetes treated with oral medications.  She has a history of hepatitis C for which she received 12 weeks of Harvoni which she completed [name] 2015 and has done well.  Dr. Gershon Crane does annual eye exams.  History of bilateral cataract extractions February 2014.  History of hypertension.  Remote history of breast cancer.  History of allergic rhinitis.  History of trigger finger right third finger.  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  There was a reexcision is September 2000 showing a single microscopic focus of intraductal carcinoma and she did have radiation therapy at that time.  Subsequently she was lost to follow-up by oncology after 2005.  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  2 lymph nodes were negative for tumor.  Tumor was estrogen receptor and progesterone receptor positive.  She was referred to radiation oncology for further radiation of the remaining right breast tissue.  She had been on estrogen replacement since 1997 at the time of her diagnosis.  There is no family history of breast cancer.  Tamoxifen was recommended but she declined to take it.  History of abnormal Pap smear 2005.  In April 2014 she had gallstone pancreatitis and underwent cholecystectomy by Dr. Lucia Gaskins.  Liver biopsy revealed cirrhosis and that was when hepatitis  C was discovered.  History of partial left thyroidectomy at [facility] in [place] in 1972.  Has never been on thyroid replacement and TSH is always been normal.  Social history: She works in a nursing home.  Does not smoke or consume alcohol.  Family history: Sister died of colon cancer.  Patient has 3 children. Review of Systems hearing loss     Objective:   Physical Exam Vitals signs reviewed.  Constitutional:      General: She is not in acute distress.    Appearance: Normal appearance.  HENT:     Head: Normocephalic and atraumatic.     Right Ear: Tympanic membrane normal.     Left Ear: Tympanic membrane normal.     Nose: Nose normal.     Mouth/Throat:     Mouth: Mucous membranes are moist.     Pharynx: Oropharynx is clear.  Eyes:     General: No scleral icterus.       Right eye: No discharge.        Left eye: No discharge.     Extraocular Movements: Extraocular movements intact.     Conjunctiva/sclera: Conjunctivae normal.     Pupils: Pupils are equal, round, and reactive to light.  Neck:     Musculoskeletal: Neck supple.     Vascular: No carotid bruit.  Cardiovascular:     Rate and Rhythm: Normal rate and regular rhythm.     Pulses: Normal pulses.     Heart sounds: Normal heart sounds. No murmur.  Pulmonary:  Effort: Pulmonary effort is normal.     Breath sounds: Normal breath sounds.     Comments: Breast without masses Abdominal:     General: Bowel sounds are normal. There is no distension.     Palpations: Abdomen is soft. There is no mass.     Tenderness: There is no rebound.     Comments: No organomegaly  Genitourinary:    Comments: Bimanual normal Musculoskeletal:        General: No deformity.  Lymphadenopathy:     Cervical: No cervical adenopathy.  Skin:    General: Skin is warm and dry.  Neurological:     General: No focal deficit present.     Mental Status: She is alert and oriented to person, place, and time.     Cranial  Nerves: No cranial nerve deficit.  Psychiatric:        Mood and Affect: Mood normal.        Behavior: Behavior normal.        Thought Content: Thought content normal.        Judgment: Judgment normal.           Assessment & Plan:  History of vertigo-no recent recurrence  Maxillary sinusitis treated by ENT physician  History of breast cancer-needs to have annual mammogram  Controlled type 2 diabetes mellitus-stable with hemoglobin A1c excellent at 6.1%  History of partial thyroidectomy with normal TSH  History of abnormal Pap smear 2005-not repeated today  History of Hepatitis C status post Harvoni treatment  History of gallstone pancreatitis status post cholecystectomy  Essential hypertension stable on current regimen  History of anxiety treated with Xanax.  This also treats vertigo.  Recent discovery of sensorineural hearing loss-may benefit from hearing aids  Subjective:   Patient presents for Medicare Annual/Subsequent preventive examination.  Review Past Medical/Family/Social:   Risk Factors  Current exercise habits: Not a lot of physical exercise except for some walking Dietary issues discussed: Low-fat low carbohydrate  Cardiac risk factors: Diabetes mellitus  Depression Screen  (Note: if answer to either of the following is "Yes", a more complete depression screening is indicated)   Over the past two weeks, have you felt down, depressed or hopeless? No  Over the past two weeks, have you felt little interest or pleasure in doing things? No Have you lost interest or pleasure in daily life? No Do you often feel hopeless? No Do you cry easily over simple problems? No   Activities of Daily Living  In your present state of health, do you have any difficulty performing the following activities?:   Driving? No  Managing money? No  Feeding yourself? No  Getting from bed to chair? No  Climbing a flight of stairs? No  Preparing food and eating?: No    Bathing or showering? No  Getting dressed: No  Getting to the toilet? No  Using the toilet:No  Moving around from place to place: No  In the past year have you fallen or had a near fall?:No  Are you sexually active? No  Do you have more than one partner? No   Hearing Difficulties: No  Do you often ask people to speak up or repeat themselves? No  Do you experience ringing or noises in your ears? No  Do you have difficulty understanding soft or whispered voices? No  Do you feel that you have a problem with memory? No Do you often misplace items? No    Home Safety:  Do you have a  smoke alarm at your residence? Yes Do you have grab bars in the bathroom?  None Do you have throw rugs in your house?  None   Cognitive Testing  Alert? Yes Normal Appearance?Yes  Oriented to person? Yes Place? Yes  Time? Yes  Recall of three objects? Yes  Can perform simple calculations? Yes  Displays appropriate judgment?Yes  Can read the correct time from a watch face?Yes   List the Names of Other Physician/Practitioners you currently use:  See referral list for the physicians patient is currently seeing.     Review of Systems: See above   Objective:     General appearance: Appears younger than stated age Head: Normocephalic, without obvious abnormality, atraumatic  Eyes: conj clear, EOMi PEERLA  Ears: normal TM's and external ear canals both ears  Nose: Nares normal. Septum midline. Mucosa normal. No drainage or sinus tenderness.  Throat: lips, mucosa, and tongue normal; teeth and gums normal  Neck: no adenopathy, no carotid bruit, no JVD, supple, symmetrical, trachea midline and thyroid not enlarged, symmetric, no tenderness/mass/nodules  No CVA tenderness.  Lungs: clear to auscultation bilaterally  Breasts: normal appearance, no masses or tenderness Heart: regular rate and rhythm, S1, S2 normal, no murmur, click, rub or gallop  Abdomen: soft, non-tender; bowel sounds normal; no  masses, no organomegaly  Musculoskeletal: ROM normal in all joints, no crepitus, no deformity, Normal muscle strengthen. Back  is symmetric, no curvature. Skin: Skin color, texture, turgor normal. No rashes or lesions  Lymph nodes: Cervical, supraclavicular, and axillary nodes normal.  Neurologic: CN 2 -12 Normal, Normal symmetric reflexes. Normal coordination and gait  Psych: Alert & Oriented x 3, Mood appear stable.    Assessment:    Annual wellness medicare exam   Plan:    During the course of the visit the patient was educated and counseled about appropriate screening and preventive services including:   Recommend annual flu vaccine  Recommend annual mammogram     Patient Instructions (the written plan) was given to the patient.  Medicare Attestation  I have personally reviewed:  The patient's medical and social history  Their use of alcohol, tobacco or illicit drugs  Their current medications and supplements  The patient's functional ability including ADLs,fall risks, home safety risks, cognitive, and hearing and visual impairment  Diet and physical activities  Evidence for depression or mood disorders  The patient's weight, height, BMI, and visual acuity have been recorded in the chart. I have made referrals, counseling, and provided education to the patient based on review of the above and I have provided the patient with a written personalized care plan for preventive services.

## 2018-12-01 ENCOUNTER — Other Ambulatory Visit: Payer: Self-pay | Admitting: Internal Medicine

## 2018-12-22 NOTE — Patient Instructions (Signed)
It was a pleasure to see you today.  Continue same medications and follow-up in 6 months 

## 2019-03-16 ENCOUNTER — Telehealth: Payer: Self-pay | Admitting: Internal Medicine

## 2019-03-16 MED ORDER — AMLODIPINE BESYLATE 2.5 MG PO TABS: 2.5000 mg | ORAL_TABLET | Freq: Every day | ORAL | 0 refills | Status: DC

## 2019-03-16 NOTE — Telephone Encounter (Signed)
Victoria [name] [phone]  LOV- CPE 12.3.19  Maurice.  Amlodipine  [facility] called to get refills for above medication

## 2019-03-22 ENCOUNTER — Other Ambulatory Visit: Payer: Self-pay

## 2019-03-22 MED ORDER — METFORMIN HCL 500 MG PO TABS: 500.0000 mg | ORAL_TABLET | Freq: Two times a day (BID) | ORAL | 1 refills | Status: DC

## 2019-04-27 ENCOUNTER — Telehealth: Payer: Self-pay

## 2019-04-27 NOTE — Telephone Encounter (Signed)
Set up virtual visit 

## 2019-04-27 NOTE — Telephone Encounter (Signed)
Patient called states she was helping a patient move into bed and when she got home she felt "her uterus collapsed" and she would like to be seen for that. 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

## 2019-04-27 NOTE — Telephone Encounter (Signed)
Virtual visit scheduled for Thursday

## 2019-04-29 ENCOUNTER — Encounter: Payer: Self-pay | Admitting: Internal Medicine

## 2019-04-29 ENCOUNTER — Other Ambulatory Visit: Payer: Self-pay

## 2019-04-29 ENCOUNTER — Ambulatory Visit (INDEPENDENT_AMBULATORY_CARE_PROVIDER_SITE_OTHER): Payer: Medicare Other | Admitting: Internal Medicine

## 2019-04-29 VITALS — Ht 68.0 in

## 2019-04-29 DIAGNOSIS — Z8619 Personal history of other infectious and parasitic diseases: Secondary | ICD-10-CM

## 2019-04-29 DIAGNOSIS — E119 Type 2 diabetes mellitus without complications: Secondary | ICD-10-CM

## 2019-04-29 NOTE — Progress Notes (Signed)
   Subjective:    Patient ID: Victoria [name], female    DOB: 28-Jul-1941, 78 y.o.   MRN: [id number]  HPI 78 year-old Female with history of diabetes mellitus and history of Hepatitis C which has been treated and is in remission seen today for a couple of reasons.  Interactive audio and video telecommunications were attempted between this provider and patient but failed because patient had technical difficulties.  We continued by audio alone.  She is identified as Victoria [name]. Berkley Harvey, a patient in this practice using 2 identifiers.  She gives verbal consent to visit in this format today due to the Coronavirus pandemic.  Patient has been working at [employer] care in [facility] but was told recently it would become a COVID-19 facility.  Because of her medical issues, she thinks she does not want to work there anymore.  She would like to that effect.  She does not know if she is eligible for unemployment.  She has not spoken with Advertising account planner at the facility.  I have asked her to look into this further before we write a note.  I would agree she should not work.  Present time due to her age and history of diabetes mellitus.  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  She thinks she has a vaginal prolapse.  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  She is in no acute distress and we will arrange for gynecology consultation for her.  She may very well  need a pessary or surgery.  She says her diabetes is under good control.  She has no other complaints at this time.    Review of Systems see above-no new complaints     Objective:   Physical Exam  Not examined due to format of visit      Assessment & Plan:  Vaginal prolapse-is to have GYN consultation in the near future  Type 2 diabetes mellitus-patient reports stable Accu-Cheks  Risk factor for COVID-19 score is 5.  Patient sent copy of her  score.  At her age and with diabetes I would agree she should not be working in [place].  She is to check with facility's human resource department to see what paperwork is needed and if she is eligible for unemployment.  Her 13-month recheck appointment is due in mid June.  Her physical exam was done November 24, 2018.  Hemoglobin A1c was stable at 6.1% in December.  25 minutes spent with patient with this visit discussing these 2 issues.  Plan to see her June 2020 for follow-up of diabetes and to address any other concerns at that point.

## 2019-04-29 NOTE — Patient Instructions (Signed)
It was a pleasure to speak with you today.  Please continue to monitor Accu-Cheks.  Follow-up here in June 2020 for 74-month recheck and hemoglobin A1c.

## 2019-05-25 ENCOUNTER — Other Ambulatory Visit: Payer: Medicare Other | Admitting: Internal Medicine

## 2019-05-25 DIAGNOSIS — I10 Essential (primary) hypertension: Secondary | ICD-10-CM

## 2019-05-25 DIAGNOSIS — H9190 Unspecified hearing loss, unspecified ear: Secondary | ICD-10-CM | POA: Diagnosis not present

## 2019-05-25 DIAGNOSIS — E119 Type 2 diabetes mellitus without complications: Secondary | ICD-10-CM

## 2019-05-26 LAB — LIPID PANEL
Cholesterol: 164 mg/dL (ref <200)
HDL: 57 mg/dL (ref >=50)
LDL Cholesterol (Calc): 92 mg/dL (calc)
Non-HDL Cholesterol (Calc): 107 mg/dL (calc) (ref <130)
Total CHOL/HDL Ratio: 2.9 (calc) (ref <5.0)
Triglycerides: 64 mg/dL (ref <150)

## 2019-05-26 LAB — BASIC METABOLIC PANEL
BUN/Creatinine Ratio: 28 (calc) — ABNORMAL HIGH (ref 6–22)
BUN: 26 mg/dL — ABNORMAL HIGH (ref 7–25)
CO2: 31 mmol/L (ref 20–32)
Calcium: 9.5 mg/dL (ref 8.6–10.4)
Chloride: 104 mmol/L (ref 98–110)
Creat: 0.93 mg/dL (ref 0.60–0.93)
Glucose, Bld: 100 mg/dL — ABNORMAL HIGH (ref 65–99)
Potassium: 4.7 mmol/L (ref 3.5–5.3)
Sodium: 142 mmol/L (ref 135–146)

## 2019-05-26 LAB — HEMOGLOBIN A1C
Hgb A1c MFr Bld: 6.3 % of total Hgb — ABNORMAL HIGH (ref <5.7)
Mean Plasma Glucose: 134 (calc)
eAG (mmol/L): 7.4 (calc)

## 2019-05-26 LAB — HEPATIC FUNCTION PANEL
AG Ratio: 1.4 (calc) (ref 1.0–2.5)
ALT: 11 U/L (ref 6–29)
AST: 14 U/L (ref 10–35)
Albumin: 4.2 g/dL (ref 3.6–5.1)
Alkaline phosphatase (APISO): 77 U/L (ref 37–153)
Bilirubin, Direct: 0.1 mg/dL (ref 0.0–0.2)
Globulin: 3.1 g/dL (calc) (ref 1.9–3.7)
Indirect Bilirubin: 0.3 mg/dL (calc) (ref 0.2–1.2)
Total Bilirubin: 0.4 mg/dL (ref 0.2–1.2)
Total Protein: 7.3 g/dL (ref 6.1–8.1)

## 2019-05-26 LAB — MICROALBUMIN / CREATININE URINE RATIO
Creatinine, Urine: 148 mg/dL (ref 20–275)
Microalb Creat Ratio: 5 mcg/mg creat (ref <30)
Microalb, Ur: 0.7 mg/dL

## 2019-05-27 ENCOUNTER — Encounter: Payer: Self-pay | Admitting: Internal Medicine

## 2019-05-27 ENCOUNTER — Encounter: Payer: Self-pay | Admitting: Gastroenterology

## 2019-05-27 ENCOUNTER — Ambulatory Visit (INDEPENDENT_AMBULATORY_CARE_PROVIDER_SITE_OTHER): Payer: Medicare Other | Admitting: Internal Medicine

## 2019-05-27 VITALS — BP 100/70 | HR 68 | Ht 68.0 in | Wt 219.0 lb

## 2019-05-27 DIAGNOSIS — Z20828 Contact with and (suspected) exposure to other viral communicable diseases: Secondary | ICD-10-CM

## 2019-05-27 DIAGNOSIS — Z20822 Contact with and (suspected) exposure to covid-19: Secondary | ICD-10-CM

## 2019-05-27 DIAGNOSIS — I10 Essential (primary) hypertension: Secondary | ICD-10-CM

## 2019-05-27 DIAGNOSIS — E119 Type 2 diabetes mellitus without complications: Secondary | ICD-10-CM | POA: Diagnosis not present

## 2019-05-27 DIAGNOSIS — Z1211 Encounter for screening for malignant neoplasm of colon: Secondary | ICD-10-CM | POA: Diagnosis not present

## 2019-05-27 DIAGNOSIS — Z6833 Body mass index (BMI) 33.0-33.9, adult: Secondary | ICD-10-CM

## 2019-05-27 DIAGNOSIS — F411 Generalized anxiety disorder: Secondary | ICD-10-CM | POA: Diagnosis not present

## 2019-05-27 DIAGNOSIS — R200 Anesthesia of skin: Secondary | ICD-10-CM | POA: Diagnosis not present

## 2019-05-27 DIAGNOSIS — Z0000 Encounter for general adult medical examination without abnormal findings: Secondary | ICD-10-CM

## 2019-05-27 NOTE — Progress Notes (Signed)
   Subjective:    Patient ID: Victoria [name], female    DOB: [birth year], 78 y.o.   MRN: [id number]  HPI 78 year old female with history of anxiety, type 2 diabetes mellitus, status post treatment for chronic hepatitis C, history of breast cancer, hypertension in today for follow-up.  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  She has been staying at home taking care of herself.  Is having some peripheral neuropathy.  B12 checked and is normal.  She also wants to be checked for COVID antibody.  This testing was performed and she does not have antibody.  Hemoglobin A1c increased from 6.1% in December to 6.3% now.  Needs to work on diet and exercise.  Liver panel was normal.  Liver functions are normal.  Fasting glucose is 100.  Potassium is normal.  BUN and creatinine are normal.    Review of Systems See above    Objective:   Physical Exam Blood pressure 100/70.  Weight 219 pounds.  BMI 33.30. Skin warm and dry.  Nodes none.  Neck supple without JVD thyromegaly or carotid bruits.  Chest clear to auscultation.  Cardiac exam regular rate and rhythm normal S1 and S2 without murmurs or gallops.      Assessment & Plan:  Impaired glucose tolerance-needs to work on diet and exercise.  Continue metformin.  BMI 33-needs to work on diet and exercise  Remote history of hepatitis C treated with Harvoni and cured  COVID-19 antibody testing requested  Peripheral neuropathy-B12 level checked and  is within normal limits.  Could have some neuropathy related to diabetes/impaired glucose tolerance.  Hyperlipidemia stable  Vaginal prolapse-to see GYN soon  Annual physical exam and Medicare wellness visit due December and has been booked  Anxiety treated with as needed anxiety medication/Xanax twice daily

## 2019-05-28 LAB — SAR COV2 SEROLOGY (COVID19)AB(IGG),IA: SARS CoV2 AB IGG: NEGATIVE

## 2019-05-28 LAB — VITAMIN B12: Vitamin B-12: 599 pg/mL (ref 200–1100)

## 2019-05-28 LAB — EXTRA SPECIMEN

## 2019-05-29 LAB — SARS-COV-2 RNA,(COVID-19) QUALITATIVE NAAT: SARS CoV2 RNA: NOT DETECTED

## 2019-05-31 ENCOUNTER — Other Ambulatory Visit: Payer: Self-pay

## 2019-06-10 ENCOUNTER — Telehealth: Payer: Self-pay | Admitting: Internal Medicine

## 2019-06-10 NOTE — Telephone Encounter (Signed)
Refill x one year °

## 2019-06-10 NOTE — Telephone Encounter (Signed)
Received Fax RX request from  Pharmacy - [facility]  Medication - amLODipine (NORVASC) 2.5 MG tablet    Last Refill - 3.24.20   90 Day supply  Last OV - 05/27/19  Last CPE - 11/24/18

## 2019-06-11 DIAGNOSIS — N8189 Other female genital prolapse: Secondary | ICD-10-CM | POA: Diagnosis not present

## 2019-06-11 MED ORDER — AMLODIPINE BESYLATE 2.5 MG PO TABS: 2.5000 mg | ORAL_TABLET | Freq: Every day | ORAL | 3 refills | Status: DC

## 2019-06-11 NOTE — Telephone Encounter (Signed)
Medication refilled for one year. 

## 2019-06-19 NOTE — Patient Instructions (Signed)
B12 level checked.  Covid antibody checked.  May take Norvasc 2.5 mg at bedtime.  Monitor blood pressure.  Return in December.  Try to diet exercise and lose some weight.

## 2019-06-21 ENCOUNTER — Ambulatory Visit: Payer: Medicare Other | Admitting: *Deleted

## 2019-06-21 ENCOUNTER — Other Ambulatory Visit: Payer: Self-pay

## 2019-06-21 VITALS — Ht 69.0 in | Wt 224.0 lb

## 2019-06-21 DIAGNOSIS — Z86010 Personal history of colonic polyps: Secondary | ICD-10-CM

## 2019-06-21 MED ORDER — NA SULFATE-K SULFATE-MG SULF 17.5-3.13-1.6 GM/177ML PO SOLN: 1.0000 | Freq: Once | ORAL | 0 refills | Status: AC

## 2019-06-21 NOTE — Progress Notes (Signed)
No egg or soy allergy known to patient  No issues with past sedation with any surgeries  or procedures, no intubation problems  No diet pills per patient No home 02 use per patient  No blood thinners per patient  Pt denies issues with constipation  No A fib or A flutter  EMMI video sent to pt's e mail   Pt verified name, DOB, address and insurance during PV today. Pt mailed instruction packet to included paper to complete and mail back to [facility] with addressed and stamped envelope, Emmi video, copy of consent form to read and not return, and instructions.  coupon mailed in packet. PV completed over the phone. Pt encouraged to call with questions or issues  

## 2019-07-02 ENCOUNTER — Telehealth: Payer: Self-pay | Admitting: Gastroenterology

## 2019-07-02 NOTE — Telephone Encounter (Signed)

## 2019-07-05 ENCOUNTER — Ambulatory Visit (AMBULATORY_SURGERY_CENTER): Payer: Medicare Other | Admitting: Gastroenterology

## 2019-07-05 ENCOUNTER — Encounter: Payer: Self-pay | Admitting: Gastroenterology

## 2019-07-05 ENCOUNTER — Other Ambulatory Visit: Payer: Self-pay

## 2019-07-05 VITALS — BP 162/90 | HR 65 | Temp 98.4°F | Resp 11 | Ht 69.0 in | Wt 224.0 lb

## 2019-07-05 DIAGNOSIS — Z538 Procedure and treatment not carried out for other reasons: Secondary | ICD-10-CM

## 2019-07-05 DIAGNOSIS — Z86010 Personal history of colonic polyps: Secondary | ICD-10-CM

## 2019-07-05 MED ORDER — PEG 3350-KCL-NA BICARB-NACL 420 G PO SOLR: 4000.0000 mL | Freq: Once | ORAL | 0 refills | Status: AC

## 2019-07-05 MED ORDER — SODIUM CHLORIDE 0.9 % IV SOLN: 500.0000 mL | Freq: Once | INTRAVENOUS | Status: DC

## 2019-07-05 NOTE — Progress Notes (Signed)
[place]- Temp JB- Vitals

## 2019-07-05 NOTE — Progress Notes (Signed)
A/ox3, pleased with MAC, report to RN 

## 2019-07-05 NOTE — Patient Instructions (Signed)
Colonoscopy re-scheduled for 07-19-19 at 4:00 pm.    Follow a 2 day prep and take one dulcolax at bedtime for a week before your colonoscopy.  Be sure to call the office if you have any problems with the prep.  YOU HAD AN ENDOSCOPIC PROCEDURE TODAY AT [facility]:   Refer to the procedure report that was given to you for any specific questions about what was found during the examination.  If the procedure report does not answer your questions, please call your gastroenterologist to clarify.  If you requested that your care partner not be given the details of your procedure findings, then the procedure report has been included in a sealed envelope for you to review at your convenience later.  YOU SHOULD EXPECT: Some feelings of bloating in the abdomen. Passage of more gas than usual.  Walking can help get rid of the air that was put into your GI tract during the procedure and reduce the bloating. If you had a lower endoscopy (such as a colonoscopy or flexible sigmoidoscopy) you may notice spotting of blood in your stool or on the toilet paper. If you underwent a bowel prep for your procedure, you may not have a normal bowel movement for a few days.  Please Note:  You might notice some irritation and congestion in your nose or some drainage.  This is from the oxygen used during your procedure.  There is no need for concern and it should clear up in a day or so.  SYMPTOMS TO REPORT IMMEDIATELY:   Following lower endoscopy (colonoscopy or flexible sigmoidoscopy):  Excessive amounts of blood in the stool  Significant tenderness or worsening of abdominal pains  Swelling of the abdomen that is new, acute  Fever of 100F or higher  For urgent or emergent issues, a gastroenterologist can be reached at any hour by calling [phone].   DIET:  We do recommend a small meal at first, but then you may proceed to your regular diet.  Drink plenty of fluids but you should avoid alcoholic  beverages for 24 hours.  ACTIVITY:  You should plan to take it easy for the rest of today and you should NOT DRIVE or use heavy machinery until tomorrow (because of the sedation medicines used during the test).    FOLLOW UP: Our staff will call the number listed on your records 48-72 hours following your procedure to check on you and address any questions or concerns that you may have regarding the information given to you following your procedure. If we do not reach you, we will leave a message.  We will attempt to reach you two times.  During this call, we will ask if you have developed any symptoms of COVID 19. If you develop any symptoms (ie: fever, flu-like symptoms, shortness of breath, cough etc.) before then, please call [phone].  If you test positive for Covid 19 in the 2 weeks post procedure, please call and report this information to [place].    If any biopsies were taken you will be contacted by phone or by letter within the next 1-3 weeks.  Please call us at [phone] if you have not heard about the biopsies in 3 weeks.    SIGNATURES/CONFIDENTIALITY: You and/or your care partner have signed paperwork which will be entered into your electronic medical record.  These signatures attest to the fact that that the information above on your After Visit Summary has been reviewed and is understood.  Full responsibility of the confidentiality of this discharge information lies with you and/or your care-partner.

## 2019-07-05 NOTE — Progress Notes (Signed)
Pt's states no medical or surgical changes since previsit or office visit. 

## 2019-07-05 NOTE — Op Note (Signed)
[facility] Patient Name: Victoria [name] Procedure Date: 07/05/2019 10:48 AM MRN: [id number] Endoscopist: Mauri Pole , MD Age: 78 Referring MD:  Date of Birth: 04-15-1941 Gender: Female Account #: [id number] Procedure:                Colonoscopy Indications:              Screening for colorectal malignant neoplasm Medicines:                Monitored Anesthesia Care Procedure:                Pre-Anesthesia Assessment:                           - Prior to the procedure, a History and Physical                            was performed, and patient medications and                            allergies were reviewed. The patient's tolerance of                            previous anesthesia was also reviewed. The risks                            and benefits of the procedure and the sedation                            options and risks were discussed with the patient.                            All questions were answered, and informed consent                            was obtained. Prior Anticoagulants: The patient has                            taken no previous anticoagulant or antiplatelet                            agents. ASA Grade Assessment: III - A patient with                            severe systemic disease. After reviewing the risks                            and benefits, the patient was deemed in                            satisfactory condition to undergo the procedure.                           After obtaining informed consent, the colonoscope  was passed under direct vision. Throughout the                            procedure, the patient's blood pressure, pulse, and                            oxygen saturations were monitored continuously. The                            Model PCF-[id number]) scope was introduced                            through the anus and advanced to the the cecum,   identified by appendiceal orifice and ileocecal                            valve. The colonoscopy was performed without                            difficulty. The patient tolerated the procedure                            well. The quality of the bowel preparation was                            poor. The ileocecal valve, appendiceal orifice, and                            rectum were photographed. Scope In: 10:53:27 AM Scope Out: 10:55:47 AM Total Procedure Duration: 0 hours 2 minutes 20 seconds  Findings:                 The perianal and digital rectal examinations were                            normal.                           A moderate amount of stool was found in the rectum                            and in the sigmoid colon, interfering with                            visualization. Complications:            No immediate complications. Estimated Blood Loss:     Estimated blood loss: none. Impression:               - Preparation of the colon was poor.                           - Stool in the rectum and in the sigmoid colon.                           - No specimens collected. Recommendation:           -  Patient has a contact number available for                            emergencies. The signs and symptoms of potential                            delayed complications were discussed with the                            patient. Return to normal activities tomorrow.                            Written discharge instructions were provided to the                            patient.                           - Resume previous diet.                           - Continue present medications.                           - Repeat colonoscopy at the next available                            appointment because the bowel preparation was                            suboptimal.                           - For future colonoscopy the patient will require                            an extended  preparation. If there are any                            questions, please contact the gastroenterologist. Mauri Pole, MD 07/05/2019 11:03:17 AM This report has been signed electronically.

## 2019-07-07 ENCOUNTER — Telehealth: Payer: Self-pay | Admitting: *Deleted

## 2019-07-07 NOTE — Telephone Encounter (Signed)
1. Have you developed a fever since your procedure? no  2.   Have you had an respiratory symptoms (SOB or cough) since your procedure? no  3.   Have you tested positive for COVID 19 since your procedure no  4.   Have you had any family members/close contacts diagnosed with the COVID 19 since your procedure?  no   If yes to any of these questions please route to Joylene John, RN and Alphonsa Gin, Therapist, sports.  Follow up Call-  Call back number 07/05/2019  Post procedure Call Back phone  # [phone]  Permission to leave phone message Yes  Some recent data might be hidden     Patient questions:  Do you have a fever, pain , or abdominal swelling? No. Pain Score  0 *  Have you tolerated food without any problems? Yes.    Have you been able to return to your normal activities? Yes.    Do you have any questions about your discharge instructions: Diet   No. Medications  No. Follow up visit  No.  Do you have questions or concerns about your Care? No.  Actions: * If pain score is 4 or above: No action needed, pain <4.

## 2019-07-08 ENCOUNTER — Telehealth: Payer: Self-pay | Admitting: Internal Medicine

## 2019-07-08 ENCOUNTER — Encounter: Payer: Self-pay | Admitting: Internal Medicine

## 2019-07-08 MED ORDER — ALPRAZOLAM 0.5 MG PO TABS: 0.5000 mg | ORAL_TABLET | Freq: Two times a day (BID) | ORAL | 5 refills | Status: DC | PRN

## 2019-07-08 NOTE — Telephone Encounter (Signed)
Refill Xanax for 6 months Has CPE December 2020.

## 2019-07-16 ENCOUNTER — Telehealth: Payer: Self-pay | Admitting: Gastroenterology

## 2019-07-16 NOTE — Telephone Encounter (Signed)

## 2019-07-19 ENCOUNTER — Encounter: Payer: Self-pay | Admitting: Gastroenterology

## 2019-07-19 ENCOUNTER — Other Ambulatory Visit: Payer: Self-pay

## 2019-07-19 ENCOUNTER — Ambulatory Visit (AMBULATORY_SURGERY_CENTER): Payer: Medicare Other | Admitting: Gastroenterology

## 2019-07-19 VITALS — BP 135/82 | HR 58 | Temp 99.0°F | Resp 13 | Ht 69.0 in | Wt 224.0 lb

## 2019-07-19 DIAGNOSIS — I10 Essential (primary) hypertension: Secondary | ICD-10-CM | POA: Diagnosis not present

## 2019-07-19 DIAGNOSIS — E119 Type 2 diabetes mellitus without complications: Secondary | ICD-10-CM | POA: Diagnosis not present

## 2019-07-19 DIAGNOSIS — Z1211 Encounter for screening for malignant neoplasm of colon: Secondary | ICD-10-CM | POA: Diagnosis not present

## 2019-07-19 MED ORDER — SODIUM CHLORIDE 0.9 % IV SOLN: 500.0000 mL | Freq: Once | INTRAVENOUS | Status: DC

## 2019-07-19 NOTE — Patient Instructions (Signed)
YOU HAD AN ENDOSCOPIC PROCEDURE TODAY AT THE [facility]:   Refer to the procedure report that was given to you for any specific questions about what was found during the examination.  If the procedure report does not answer your questions, please call your gastroenterologist to clarify.  If you requested that your care partner not be given the details of your procedure findings, then the procedure report has been included in a sealed envelope for you to review at your convenience later.  YOU SHOULD EXPECT: Some feelings of bloating in the abdomen. Passage of more gas than usual.  Walking can help get rid of the air that was put into your GI tract during the procedure and reduce the bloating. If you had a lower endoscopy (such as a colonoscopy or flexible sigmoidoscopy) you may notice spotting of blood in your stool or on the toilet paper. If you underwent a bowel prep for your procedure, you may not have a normal bowel movement for a few days.  Please Note:  You might notice some irritation and congestion in your nose or some drainage.  This is from the oxygen used during your procedure.  There is no need for concern and it should clear up in a day or so.  SYMPTOMS TO REPORT IMMEDIATELY:   Following lower endoscopy (colonoscopy or flexible sigmoidoscopy):  Excessive amounts of blood in the stool  Significant tenderness or worsening of abdominal pains  Swelling of the abdomen that is new, acute  Fever of 100F or higher  For urgent or emergent issues, a gastroenterologist can be reached at any hour by calling [phone].   DIET:  We do recommend a small meal at first, but then you may proceed to your regular diet.  Drink plenty of fluids but you should avoid alcoholic beverages for 24 hours.  ACTIVITY:  You should plan to take it easy for the rest of today and you should NOT DRIVE or use heavy machinery until tomorrow (because of the sedation medicines used during the test).     FOLLOW UP: Our staff will call the number listed on your records 48-72 hours following your procedure to check on you and address any questions or concerns that you may have regarding the information given to you following your procedure. If we do not reach you, we will leave a message.  We will attempt to reach you two times.  During this call, we will ask if you have developed any symptoms of COVID 19. If you develop any symptoms (ie: fever, flu-like symptoms, shortness of breath, cough etc.) before then, please call [phone].  If you test positive for Covid 19 in the 2 weeks post procedure, please call and report this information to us.    If any biopsies were taken you will be contacted by phone or by letter within the next 1-3 weeks.  Please call us at [phone] if you have not heard about the biopsies in 3 weeks.    SIGNATURES/CONFIDENTIALITY: You and/or your care partner have signed paperwork which will be entered into your electronic medical record.  These signatures attest to the fact that that the information above on your After Visit Summary has been reviewed and is understood.  Full responsibility of the confidentiality of this discharge information lies with you and/or your care-partner. 

## 2019-07-19 NOTE — Progress Notes (Signed)
Report given to PACU, vss 

## 2019-07-19 NOTE — Progress Notes (Signed)
Pt's states no medical or surgical changes since previsit or office visit. 

## 2019-07-19 NOTE — Progress Notes (Signed)
Jm- Temp CW- vitals

## 2019-07-19 NOTE — Op Note (Signed)
[facility] Patient Name: Victoria [name] Procedure Date: 07/19/2019 3:33 PM MRN: [id number] Endoscopist: Mauri Pole , MD Age: 78 Referring MD:  Date of Birth: August 09, 1941 Gender: Female Account #: [id number] Procedure:                Colonoscopy Indications:              Screening for colorectal malignant neoplasm Medicines:                Monitored Anesthesia Care Procedure:                Pre-Anesthesia Assessment:                           - Prior to the procedure, a History and Physical                            was performed, and patient medications and                            allergies were reviewed. The patient's tolerance of                            previous anesthesia was also reviewed. The risks                            and benefits of the procedure and the sedation                            options and risks were discussed with the patient.                            All questions were answered, and informed consent                            was obtained. Prior Anticoagulants: The patient has                            taken no previous anticoagulant or antiplatelet                            agents. ASA Grade Assessment: III - A patient with                            severe systemic disease. After reviewing the risks                            and benefits, the patient was deemed in                            satisfactory condition to undergo the procedure.                           After obtaining informed consent, the colonoscope  was passed under direct vision. Throughout the                            procedure, the patient's blood pressure, pulse, and                            oxygen saturations were monitored continuously. The                            Colonoscope was introduced through the anus and                            advanced to the the cecum, identified by                            appendiceal  orifice and ileocecal valve. The                            colonoscopy was performed without difficulty. The                            patient tolerated the procedure well. The quality                            of the bowel preparation was fair. The ileocecal                            valve, appendiceal orifice, and rectum were                            photographed. Scope In: 3:46:27 PM Scope Out: 4:03:14 PM Scope Withdrawal Time: 0 hours 8 minutes 21 seconds  Total Procedure Duration: 0 hours 16 minutes 47 seconds  Findings:                 The perianal and digital rectal examinations were                            normal.                           Scattered small and large-mouthed diverticula were                            found in the sigmoid colon, descending colon,                            transverse colon, ascending colon and cecum.                           Non-bleeding internal hemorrhoids were found during                            retroflexion. The hemorrhoids were small.  The exam was otherwise without abnormality. Complications:            No immediate complications. Estimated Blood Loss:     Estimated blood loss: none. Impression:               - Preparation of the colon was fair.                           - Severe diverticulosis in the sigmoid colon, in                            the descending colon, in the transverse colon, in                            the ascending colon and in the cecum.                           - Non-bleeding internal hemorrhoids.                           - The examination was otherwise normal.                           - No specimens collected. Recommendation:           - Patient has a contact number available for                            emergencies. The signs and symptoms of potential                            delayed complications were discussed with the                            patient. Return to normal  activities tomorrow.                            Written discharge instructions were provided to the                            patient.                           - Resume previous diet.                           - Continue present medications.                           - No repeat colonoscopy due to age.                           - Return to GI clinic PRN. Mauri Pole, MD 07/19/2019 4:10:28 PM This report has been signed electronically.

## 2019-07-21 ENCOUNTER — Telehealth: Payer: Self-pay | Admitting: *Deleted

## 2019-07-21 NOTE — Telephone Encounter (Signed)
  Follow up Call-  Call back number 07/19/2019 07/05/2019  Post procedure Call Back phone  # [phone]  Permission to leave phone message Yes Yes  Some recent data might be hidden     Patient questions:  Do you have a fever, pain , or abdominal swelling? No. Pain Score  0 *  Have you tolerated food without any problems? Yes.    Have you been able to return to your normal activities? Yes.    Do you have any questions about your discharge instructions: Diet   No. Medications  No. Follow up visit  No.  Do you have questions or concerns about your Care? No.  Actions: * If pain score is 4 or above: No action needed, pain <4.   1. Have you developed a fever since your procedure? no  2.   Have you had an respiratory symptoms (SOB or cough) since your procedure? no  3.   Have you tested positive for COVID 19 since your procedure no  4.   Have you had any family members/close contacts diagnosed with the COVID 19 since your procedure?  no   If yes to any of these questions please route to Joylene John, RN and Alphonsa Gin, Therapist, sports.

## 2019-09-01 DIAGNOSIS — Z23 Encounter for immunization: Secondary | ICD-10-CM | POA: Diagnosis not present

## 2019-09-08 ENCOUNTER — Telehealth: Payer: Self-pay | Admitting: Internal Medicine

## 2019-09-08 MED ORDER — LOSARTAN POTASSIUM-HCTZ 100-25 MG PO TABS: 1.0000 | ORAL_TABLET | Freq: Every day | ORAL | 1 refills | Status: DC

## 2019-09-08 MED ORDER — METFORMIN HCL 500 MG PO TABS: 500.0000 mg | ORAL_TABLET | Freq: Two times a day (BID) | ORAL | 1 refills | Status: DC

## 2019-09-08 NOTE — Telephone Encounter (Signed)
Received Fax RX request from  [facility]  Medication - losartan-hydrochlorothiazide (HYZAAR) 100-25 MG tablet / metFORMIN (GLUCOPHAGE) 500 MG tablet  Last Refill - 06/10/19  Last OV - 05/27/19  Last CPE - 11/24/18  Next CPE scheduled for 11/30/19

## 2019-10-11 ENCOUNTER — Other Ambulatory Visit: Payer: Self-pay

## 2019-10-11 DIAGNOSIS — Z20822 Contact with and (suspected) exposure to covid-19: Secondary | ICD-10-CM

## 2019-10-11 DIAGNOSIS — Z20828 Contact with and (suspected) exposure to other viral communicable diseases: Secondary | ICD-10-CM | POA: Diagnosis not present

## 2019-10-13 LAB — NOVEL CORONAVIRUS, NAA: SARS-CoV-2, NAA: NOT DETECTED

## 2019-11-25 NOTE — Addendum Note (Signed)
Addended by: Mady Haagensen on: 11/25/2019 04:56 PM   Modules accepted: Orders

## 2019-11-26 ENCOUNTER — Other Ambulatory Visit: Payer: Self-pay

## 2019-11-26 ENCOUNTER — Other Ambulatory Visit: Payer: Medicare Other | Admitting: Internal Medicine

## 2019-11-26 DIAGNOSIS — H8110 Benign paroxysmal vertigo, unspecified ear: Secondary | ICD-10-CM

## 2019-11-26 DIAGNOSIS — D696 Thrombocytopenia, unspecified: Secondary | ICD-10-CM

## 2019-11-26 DIAGNOSIS — E119 Type 2 diabetes mellitus without complications: Secondary | ICD-10-CM | POA: Diagnosis not present

## 2019-11-26 DIAGNOSIS — F419 Anxiety disorder, unspecified: Secondary | ICD-10-CM | POA: Diagnosis not present

## 2019-11-26 DIAGNOSIS — Z0000 Encounter for general adult medical examination without abnormal findings: Secondary | ICD-10-CM

## 2019-11-26 DIAGNOSIS — F411 Generalized anxiety disorder: Secondary | ICD-10-CM

## 2019-11-26 DIAGNOSIS — I10 Essential (primary) hypertension: Secondary | ICD-10-CM

## 2019-11-26 DIAGNOSIS — E1122 Type 2 diabetes mellitus with diabetic chronic kidney disease: Secondary | ICD-10-CM

## 2019-11-27 LAB — COMPLETE METABOLIC PANEL WITH GFR
AG Ratio: 1.4 (calc) (ref 1.0–2.5)
ALT: 11 U/L (ref 6–29)
AST: 15 U/L (ref 10–35)
Albumin: 4.3 g/dL (ref 3.6–5.1)
Alkaline phosphatase (APISO): 81 U/L (ref 37–153)
BUN/Creatinine Ratio: 18 (calc) (ref 6–22)
BUN: 17 mg/dL (ref 7–25)
CO2: 27 mmol/L (ref 20–32)
Calcium: 9.5 mg/dL (ref 8.6–10.4)
Chloride: 102 mmol/L (ref 98–110)
Creat: 0.97 mg/dL — ABNORMAL HIGH (ref 0.60–0.93)
GFR, Est African American: 66 mL/min/{1.73_m2} (ref >=60)
GFR, Est Non African American: 57 mL/min/{1.73_m2} — ABNORMAL LOW (ref >=60)
Globulin: 3.1 g/dL (calc) (ref 1.9–3.7)
Glucose, Bld: 149 mg/dL — ABNORMAL HIGH (ref 65–99)
Potassium: 4.5 mmol/L (ref 3.5–5.3)
Sodium: 141 mmol/L (ref 135–146)
Total Bilirubin: 0.5 mg/dL (ref 0.2–1.2)
Total Protein: 7.4 g/dL (ref 6.1–8.1)

## 2019-11-27 LAB — CBC WITH DIFFERENTIAL/PLATELET
Absolute Monocytes: 513 cells/uL (ref 200–950)
Basophils Absolute: 27 cells/uL (ref 0–200)
Basophils Relative: 0.3 %
Eosinophils Absolute: 171 cells/uL (ref 15–500)
Eosinophils Relative: 1.9 %
HCT: 40.2 % (ref 35.0–45.0)
Hemoglobin: 13 g/dL (ref 11.7–15.5)
Lymphs Abs: 3006 cells/uL (ref 850–3900)
MCH: 26.6 pg — ABNORMAL LOW (ref 27.0–33.0)
MCHC: 32.3 g/dL (ref 32.0–36.0)
MCV: 82.4 fL (ref 80.0–100.0)
MPV: 9.8 fL (ref 7.5–12.5)
Monocytes Relative: 5.7 %
Neutro Abs: 5283 cells/uL (ref 1500–7800)
Neutrophils Relative %: 58.7 %
Platelets: 235 10*3/uL (ref 140–400)
RBC: 4.88 10*6/uL (ref 3.80–5.10)
RDW: 14.9 % (ref 11.0–15.0)
Total Lymphocyte: 33.4 %
WBC: 9 10*3/uL (ref 3.8–10.8)

## 2019-11-27 LAB — HEMOGLOBIN A1C
Hgb A1c MFr Bld: 7 % of total Hgb — ABNORMAL HIGH (ref <5.7)
Mean Plasma Glucose: 154 (calc)
eAG (mmol/L): 8.5 (calc)

## 2019-11-27 LAB — LIPID PANEL
Cholesterol: 159 mg/dL (ref <200)
HDL: 46 mg/dL — ABNORMAL LOW (ref >=50)
LDL Cholesterol (Calc): 94 mg/dL (calc)
Non-HDL Cholesterol (Calc): 113 mg/dL (calc) (ref <130)
Total CHOL/HDL Ratio: 3.5 (calc) (ref <5.0)
Triglycerides: 93 mg/dL (ref <150)

## 2019-11-27 LAB — TSH: TSH: 1.23 mIU/L (ref 0.40–4.50)

## 2019-11-29 ENCOUNTER — Telehealth: Payer: Self-pay | Admitting: Internal Medicine

## 2019-11-29 NOTE — Telephone Encounter (Addendum)
Patient called back to say that she went to be tested over a month ago in [facility] at a drive thru, and they never called her to give results. When she went, she was having no symptoms and no exposure, still is having no symptoms or exposure.

## 2019-11-29 NOTE — Telephone Encounter (Signed)
Called patient to see if she has results of COVID test and if so we need a copy. We need this before tomorrows appointment.

## 2019-11-30 ENCOUNTER — Encounter: Payer: Self-pay | Admitting: Internal Medicine

## 2019-11-30 ENCOUNTER — Other Ambulatory Visit: Payer: Self-pay

## 2019-11-30 ENCOUNTER — Ambulatory Visit (INDEPENDENT_AMBULATORY_CARE_PROVIDER_SITE_OTHER): Payer: Medicare Other | Admitting: Internal Medicine

## 2019-11-30 VITALS — BP 100/80 | HR 86 | Temp 98.0°F | Ht 68.0 in | Wt 241.0 lb

## 2019-11-30 DIAGNOSIS — Z0000 Encounter for general adult medical examination without abnormal findings: Secondary | ICD-10-CM | POA: Diagnosis not present

## 2019-11-30 DIAGNOSIS — Z853 Personal history of malignant neoplasm of breast: Secondary | ICD-10-CM

## 2019-11-30 DIAGNOSIS — Z9009 Acquired absence of other part of head and neck: Secondary | ICD-10-CM

## 2019-11-30 DIAGNOSIS — R635 Abnormal weight gain: Secondary | ICD-10-CM

## 2019-11-30 DIAGNOSIS — E890 Postprocedural hypothyroidism: Secondary | ICD-10-CM

## 2019-11-30 DIAGNOSIS — E786 Lipoprotein deficiency: Secondary | ICD-10-CM | POA: Diagnosis not present

## 2019-11-30 DIAGNOSIS — Z8619 Personal history of other infectious and parasitic diseases: Secondary | ICD-10-CM

## 2019-11-30 DIAGNOSIS — I10 Essential (primary) hypertension: Secondary | ICD-10-CM | POA: Diagnosis not present

## 2019-11-30 DIAGNOSIS — F411 Generalized anxiety disorder: Secondary | ICD-10-CM

## 2019-11-30 DIAGNOSIS — E119 Type 2 diabetes mellitus without complications: Secondary | ICD-10-CM

## 2019-11-30 DIAGNOSIS — Z6836 Body mass index (BMI) 36.0-36.9, adult: Secondary | ICD-10-CM

## 2019-11-30 DIAGNOSIS — H903 Sensorineural hearing loss, bilateral: Secondary | ICD-10-CM

## 2019-11-30 LAB — POCT URINALYSIS DIPSTICK
Appearance: NEGATIVE
Bilirubin, UA: NEGATIVE
Blood, UA: NEGATIVE
Glucose, UA: NEGATIVE
Ketones, UA: NEGATIVE
Leukocytes, UA: NEGATIVE
Nitrite, UA: NEGATIVE
Odor: NEGATIVE
Protein, UA: NEGATIVE
Spec Grav, UA: 1.01 (ref 1.010–1.025)
Urobilinogen, UA: 0.2 E.U./dL
pH, UA: 6 (ref 5.0–8.0)

## 2019-11-30 NOTE — Progress Notes (Signed)
Subjective:    Patient ID: Victoria Victoria [name], female    DOB: [birth year], 78 y.o.   MRN: [id number]  HPI 78 year old Female for [employer], health maintenance exam and evaluation of medical issues.  History of vertigo and was diagnosed with chronic sphenoid sinusitis.  Did not have sinus CT.  Has been treated by ENT with prednisone and Augmentin in the past.  Hearing check showed mild to moderate sensorineural hearing loss.  Occasionally has what she describes as sinus headache and would like some hydrocodone/APAP to take sparingly for that.  History of diabetes mellitus treated with oral medications.  History of hepatitis C for which she received 12 weeks of Harvoni which she completed Victoria [name] 2015 and is doing well.  Dr. Gershon Crane does annual eye exams.  History of bilateral cataract extractions February 2014.  History of hypertension.  Remote history of breast cancer.  History of allergic rhinitis.  History of trigger finger right third finger.  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  There was a reexcision in September 2000 showing a single microscopic focus of intraductal carcinoma.  She did have radiation therapy at that time.  Subsequently she was lost to follow-up by oncology after 2005.  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  2 lymph nodes were negative for tumor.  Tumor was estrogen receptor and progesterone receptor positive.  She was referred to radiation oncology for further radiation of the remaining right breast tissue.  She had been on estrogen replacement since 1997 at the time of her diagnosis.  There is no family history of breast cancer.  Tamoxifen was recommended but she declined to take it.  History of abnormal Pap smear 2005.  In April 2014 she had gallstone  pancreatitis and underwent cholecystectomy by Dr. Lucia Gaskins.  Liver biopsy revealed cirrhosis and that was when hepatitis C was discovered.  History of partial left thyroidectomy at [facility] in [place] in 1972.  Has never been on thyroid replacement and TSH has always been normal.  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  Does not smoke or consume alcohol.  Family history: Sister died of colon cancer.  Patient has 3 children.    Review of Systems occasional "sinus headache ".  History of vertigo.  History of hearing loss.     Objective:   Physical Exam Blood pressure 100/80, pulse 86, temperature 98 degrees, pulse oximetry 96% weight 241 pounds BMI 36.64.  Skin warm and dry.  Nodes none.  Neck is supple without JVD thyromegaly or carotid bruits.  Chest is clear to auscultation.  Cardiac exam regular rate and rhythm normal S1 and S2 without murmurs or gallops.  Extremities without edema.  Breast without masses.  Bimanual is normal.  Neuro no focal deficits on brief neurological exam.  I did not detect significant hearing loss today with normal speaking.       Assessment & Plan:  History of hearing loss  History of right breast cancer  History of chronic hepatitis C treated with Harvoni  Controlled type 2 diabetes mellitus with stable hemoglobin A1c  History of partial thyroidectomy with normal TSH  History of abnormal Pap smear 2005-not repeated today  History of gallstone pancreatitis status post cholecystectomy  Essential hypertension stable on current regimen  History of anxiety treated with Xanax sparingly and also treats vertigo  Plan: Continue current medications and return in 6 months or as needed.  Small amount of hydrocodone APAP given for "sinus" headache.  Hemoglobin A1c has increased from 6.3 to 7%.  Not been exercising nor watching her diet.  Will need to recheck in 6 months.  Encourage  diet exercise and weight loss.  She is supposed to be taking Metformin 500 mg twice daily.  With regard to hypertension blood pressure stable on losartan HCTZ and amlodipine.  Has gained 22 pounds.  Subjective:   Patient presents for Medicare Annual/Subsequent preventive examination.  Review Past Medical/Family/Social: See above   Risk Factors  Current exercise habits: Not a lot of exercise Dietary issues discussed: Low-fat low carbohydrate  Cardiac risk factors: Diabetes mellitus  Depression Screen  (Note: if answer to either of the following is "Yes", a more complete depression screening is indicated)   Over the past two weeks, have you felt down, depressed or hopeless? No  Over the past two weeks, have you felt little interest or pleasure in doing things? No Have you lost interest or pleasure in daily life? No Do you often feel hopeless? No Do you cry easily over simple problems? No   Activities of Daily Living  In your present state of health, do you have any difficulty performing the following activities?:   Driving? No  Managing money? No  Feeding yourself? No  Getting from bed to chair? No  Climbing a flight of stairs? No  Preparing food and eating?: No  Bathing or showering? No  Getting dressed: No  Getting to the toilet? No  Using the toilet:No  Moving around from place to place: No  In the past year have you fallen or had a near fall?:No  Are you sexually active? No  Do you have more than one partner? No   Hearing Difficulties: No  Do you often ask people to speak up or repeat themselves? No  Do you experience ringing or noises in your ears? No  Do you have difficulty understanding soft or whispered voices? No  Do you feel that you have a problem with memory? No Do you often misplace items? No    Home Safety:  Do you have a smoke alarm at your residence? Yes Do you have grab bars in the bathroom?  None  do you have throw rugs in your house?  None    Cognitive Testing  Alert? Yes Normal Appearance?Yes  Oriented to person? Yes Place? Yes  Time? Yes  Recall of three objects? Yes  Can perform simple calculations? Yes  Displays appropriate judgment?Yes  Can read the correct time from a watch face?Yes   List the Names of Other Physician/Practitioners you currently use:  See referral list for the physicians patient is currently seeing.     Review of Systems: See above   Objective:     General appearance: Appears stated age and mildly obese  Head: Normocephalic, without obvious abnormality, atraumatic  Eyes: conj clear, EOMi PEERLA  Ears: normal TM's and external ear canals both ears  Nose: Nares normal. Septum midline. Mucosa normal. No drainage or sinus tenderness.  Throat: lips, mucosa, and tongue normal; teeth and gums normal  Neck: no adenopathy, no carotid bruit, no JVD, supple, symmetrical, trachea midline and thyroid not enlarged, symmetric, no tenderness/mass/nodules  No CVA tenderness.  Lungs: clear to auscultation bilaterally  Breasts: normal appearance,  no masses or tenderness Heart: regular rate and rhythm, S1, S2 normal, no murmur, click, rub or gallop  Abdomen: soft, non-tender; bowel sounds normal; no masses, no organomegaly  Musculoskeletal: ROM normal in all joints, no crepitus, no deformity, Normal muscle strengthen. Back  is symmetric, no curvature. Skin: Skin color, texture, turgor normal. No rashes or lesions  Lymph nodes: Cervical, supraclavicular, and axillary nodes normal.  Neurologic: CN 2 -12 Normal, Normal symmetric reflexes. Normal coordination and gait  Psych: Alert & Oriented x 3, Mood appear stable.    Assessment:    Annual wellness medicare exam   Plan:    During the course of the visit the patient was educated and counseled about appropriate screening and preventive services including:   Reminded regarding annual flu vaccine  Encourage patient to take COVID-19 vaccine when available      Patient Instructions (the written plan) was given to the patient.  Medicare Attestation  I have personally reviewed:  The patient's medical and social history  Their use of alcohol, tobacco or illicit drugs  Their current medications and supplements  The patient's functional ability including ADLs,fall risks, home safety risks, cognitive, and hearing and visual impairment  Diet and physical activities  Evidence for depression or mood disorders  The patient's weight, height, BMI, and visual acuity have been recorded in the chart. I have made referrals, counseling, and provided education to the patient based on review of the above and I have provided the patient with a written personalized care plan for preventive services.

## 2019-11-30 NOTE — Patient Instructions (Addendum)
It was a pleasure to see you today. Given small quantity of Vicodin 7.5 mg as needed for pain RTC in 6 months. Need to watch diet and exercise. Need to lose weight has gained 22 pounds.  Take Metformin.

## 2019-12-01 ENCOUNTER — Other Ambulatory Visit: Payer: Self-pay | Admitting: Internal Medicine

## 2019-12-01 MED ORDER — HYDROCODONE-ACETAMINOPHEN 7.5-300 MG PO TABS: ORAL_TABLET | ORAL | 0 refills | Status: DC

## 2019-12-01 NOTE — Telephone Encounter (Signed)
Please check these Rx. Have already sent one. Call them and tell them to decline the latest request

## 2019-12-01 NOTE — Telephone Encounter (Signed)
Have refilled this

## 2019-12-01 NOTE — Addendum Note (Signed)
Addended by: Mady Haagensen on: 12/01/2019 02:12 PM   Modules accepted: Orders

## 2019-12-01 NOTE — Telephone Encounter (Signed)
Victoria [name] called to say she was supposed to get a pain medication called in to her pharmacy from her office visit yesterday.

## 2019-12-01 NOTE — Telephone Encounter (Signed)
Pharmacist called states states they need a new rx for hydrocodone 7.5-325 bc they do nit carry hydrocodone 7.5-300.

## 2019-12-02 MED ORDER — HYDROCODONE-ACETAMINOPHEN 7.5-325 MG PO TABS: 1.0000 | ORAL_TABLET | Freq: Three times a day (TID) | ORAL | 0 refills | Status: DC

## 2019-12-02 NOTE — Telephone Encounter (Signed)
The one we had sent they do not carry that dose they have discontinued it, and bc of the law regulations they require a new rx to be sent.

## 2019-12-24 HISTORY — PX: COLONOSCOPY: SHX174

## 2020-01-25 ENCOUNTER — Telehealth: Payer: Self-pay | Admitting: Internal Medicine

## 2020-01-25 DIAGNOSIS — E119 Type 2 diabetes mellitus without complications: Secondary | ICD-10-CM

## 2020-01-25 NOTE — Telephone Encounter (Signed)
Victoria [name] [phone]  [name] called to say she needs prescription for [facility] glucometer and strips.  [facility] [place], [place] Phone:  [phone]  Fax:  [phone]

## 2020-01-26 MED ORDER — FREESTYLE TEST VI STRP: ORAL_STRIP | 12 refills | Status: DC

## 2020-01-26 MED ORDER — FREESTYLE LANCETS MISC: 12 refills | Status: DC

## 2020-01-26 MED ORDER — FREESTYLE LIBRE SENSOR SYSTEM MISC: 1 refills | Status: DC

## 2020-01-26 NOTE — Telephone Encounter (Signed)
Done

## 2020-01-26 NOTE — Addendum Note (Signed)
Addended by: Mady Haagensen on: 01/26/2020 01:50 PM   Modules accepted: Orders

## 2020-02-02 ENCOUNTER — Other Ambulatory Visit: Payer: Self-pay

## 2020-02-02 DIAGNOSIS — E119 Type 2 diabetes mellitus without complications: Secondary | ICD-10-CM

## 2020-02-02 MED ORDER — FREESTYLE LIBRE SENSOR SYSTEM MISC: 11 refills | Status: DC

## 2020-02-03 ENCOUNTER — Other Ambulatory Visit: Payer: Self-pay

## 2020-02-03 MED ORDER — ALPRAZOLAM 0.5 MG PO TABS: 0.5000 mg | ORAL_TABLET | Freq: Two times a day (BID) | ORAL | 5 refills | Status: DC | PRN

## 2020-03-06 ENCOUNTER — Telehealth: Payer: Self-pay | Admitting: Internal Medicine

## 2020-03-06 MED ORDER — LOSARTAN POTASSIUM-HCTZ 100-25 MG PO TABS: 1.0000 | ORAL_TABLET | Freq: Every day | ORAL | 1 refills | Status: DC

## 2020-03-06 MED ORDER — METFORMIN HCL 500 MG PO TABS: 500.0000 mg | ORAL_TABLET | Freq: Two times a day (BID) | ORAL | 1 refills | Status: DC

## 2020-03-06 NOTE — Telephone Encounter (Signed)
Received Fax RX request from  [facility], [place] Phone:  [phone]  Fax:  [phone]       Medication -  metFORMIN (GLUCOPHAGE) 500 MG tablet losartan-hydrochlorothiazide (HYZAAR) 100-25 MG tablet   Last Refill - 12/07/19  Last OV - 11/30/19  Last CPE - 11/30/19  Next Appointment - 06/01/2020

## 2020-05-16 ENCOUNTER — Encounter: Payer: Self-pay | Admitting: Internal Medicine

## 2020-05-16 DIAGNOSIS — H524 Presbyopia: Secondary | ICD-10-CM | POA: Diagnosis not present

## 2020-05-16 DIAGNOSIS — E119 Type 2 diabetes mellitus without complications: Secondary | ICD-10-CM | POA: Diagnosis not present

## 2020-05-16 DIAGNOSIS — Z961 Presence of intraocular lens: Secondary | ICD-10-CM | POA: Diagnosis not present

## 2020-05-16 DIAGNOSIS — H04123 Dry eye syndrome of bilateral lacrimal glands: Secondary | ICD-10-CM | POA: Diagnosis not present

## 2020-05-16 LAB — HM DIABETES EYE EXAM

## 2020-05-29 ENCOUNTER — Other Ambulatory Visit: Payer: Medicare Other | Admitting: Internal Medicine

## 2020-05-29 ENCOUNTER — Other Ambulatory Visit: Payer: Self-pay

## 2020-05-29 DIAGNOSIS — E119 Type 2 diabetes mellitus without complications: Secondary | ICD-10-CM | POA: Diagnosis not present

## 2020-05-30 LAB — HEMOGLOBIN A1C
Hgb A1c MFr Bld: 6.6 % of total Hgb — ABNORMAL HIGH (ref <5.7)
Mean Plasma Glucose: 143 (calc)
eAG (mmol/L): 7.9 (calc)

## 2020-06-01 ENCOUNTER — Other Ambulatory Visit: Payer: Self-pay

## 2020-06-01 ENCOUNTER — Encounter: Payer: Self-pay | Admitting: Internal Medicine

## 2020-06-01 ENCOUNTER — Ambulatory Visit (INDEPENDENT_AMBULATORY_CARE_PROVIDER_SITE_OTHER): Payer: Medicare Other | Admitting: Internal Medicine

## 2020-06-01 VITALS — BP 100/70 | HR 114 | Ht 68.0 in | Wt 232.0 lb

## 2020-06-01 DIAGNOSIS — Z853 Personal history of malignant neoplasm of breast: Secondary | ICD-10-CM

## 2020-06-01 DIAGNOSIS — H8110 Benign paroxysmal vertigo, unspecified ear: Secondary | ICD-10-CM | POA: Diagnosis not present

## 2020-06-01 DIAGNOSIS — J302 Other seasonal allergic rhinitis: Secondary | ICD-10-CM | POA: Diagnosis not present

## 2020-06-01 DIAGNOSIS — I10 Essential (primary) hypertension: Secondary | ICD-10-CM

## 2020-06-01 DIAGNOSIS — Z8619 Personal history of other infectious and parasitic diseases: Secondary | ICD-10-CM | POA: Diagnosis not present

## 2020-06-01 DIAGNOSIS — H903 Sensorineural hearing loss, bilateral: Secondary | ICD-10-CM

## 2020-06-01 DIAGNOSIS — N6099 Unspecified benign mammary dysplasia of unspecified breast: Secondary | ICD-10-CM | POA: Diagnosis not present

## 2020-06-01 DIAGNOSIS — E119 Type 2 diabetes mellitus without complications: Secondary | ICD-10-CM

## 2020-06-01 DIAGNOSIS — J0101 Acute recurrent maxillary sinusitis: Secondary | ICD-10-CM

## 2020-06-01 MED ORDER — AMOXICILLIN 500 MG PO CAPS
500.0000 mg | ORAL_CAPSULE | Freq: Three times a day (TID) | ORAL | 1 refills | Status: DC
Start: 2020-06-01 — End: 2020-07-17

## 2020-06-01 MED ORDER — PREDNISONE 10 MG PO TABS
ORAL_TABLET | ORAL | 1 refills | Status: DC
Start: 2020-06-01 — End: 2020-07-17

## 2020-06-01 MED ORDER — NEOMYCIN-POLYMYXIN-HC 3.5-10000-1 OT SOLN
4.0000 [drp] | Freq: Every day | OTIC | 1 refills | Status: DC
Start: 2020-06-01 — End: 2021-02-20

## 2020-06-01 NOTE — Progress Notes (Signed)
   Subjective:    Patient ID: Victoria [name], female    DOB: 1941-11-09, 79 y.o.   MRN: [id number]  HPI 79 year old Female for 6 month recheck.Hx of seasonal allergic rhinitis and chronic sinusitis.  Today is complaining of dizziness and maxillary sinus pressure.  Says this has been particularly bothersome ever since her cruise that she took in October 2018 and seems to be recurrent.  At that time, she had significant vertigo.  She had audiology exam in [place] at [facility] in [place] in [name] 2019.  Was treated with Allegra, Augmentin and steroids.    She saw Dr. Jannifer Franklin, Neurologist, in [name] 2019.  She had MRI of the brain May 2019 showing no acute intracranial abnormality and few tiny remote right cerebellar infarcts.  A chronic microvascular ischemic disease.  Had chronic sphenoid sinusitis with superimposed air-fluid level within the right sphenoid sinus suspicious for acute on chronic disease.  Patient also has history of diabetes mellitus treated with oral medications.  With regard to diabetes mellitus, hemoglobin A1c is 6.6% in improved from December 2020 when it was 7%  Remote history of Hepatitis C for which she received Harvoni in 2015 and has done well.  Remote history of breast cancer.  No recent mammogram.  Last one on file was 2017.  Had breast biopsy with ductal hyperplasia.  Review of Systems main complaint today is vertigo and maxillary sinus pressure.     Objective:   Physical Exam  Blood pressure 100/70 pulse 114 weight 232 pounds height 5 feet 8 inches BMI 35.28 patient complaining bitterly of vertigo and sinus pressure  Skin warm and dry.  Pharynx is clear.  TMs are clear.  Neck is supple.  Chest clear to auscultation.  No significant nystagmus on extraocular movement testing.      Assessment & Plan:  Acute recurrent  maxillary sinusitis  History of vertigo-recurrent  History of breast cancer-needs mammogram  Type 2 diabetes  mellitus-stable on current regimen with Metformin twice daily  Essential hypertension treated with amlodipine 2.5 mg daily and losartan HCTZ 100/25 daily.  History of seasonal allergic rhinitis-has seen allergist  Plan: Amoxicillin 500 mg 3 times a day for 10 days.  Prednisone 10 mg (number 21 tablets) take in tapering course as directed 6-5-4-3-2-1  Plan: See above recommendations and treatment.   Return in 6 months

## 2020-06-20 ENCOUNTER — Telehealth: Payer: Self-pay | Admitting: Internal Medicine

## 2020-06-20 DIAGNOSIS — E119 Type 2 diabetes mellitus without complications: Secondary | ICD-10-CM

## 2020-06-20 MED ORDER — FREESTYLE TEST VI STRP: ORAL_STRIP | 12 refills | Status: DC

## 2020-06-20 NOTE — Telephone Encounter (Signed)
Received Fax RX request from  [facility] #[id number], [place] Phone:  [phone]  Fax:  [phone]       Medication - Glucose Test Strips  Last Refill - 06/01/20  Last OV - 11/30/19  Last CPE - 11/30/19  Next Appointment - 12/01/19

## 2020-07-07 ENCOUNTER — Other Ambulatory Visit: Payer: Self-pay | Admitting: Internal Medicine

## 2020-07-07 MED ORDER — AMLODIPINE BESYLATE 2.5 MG PO TABS: 2.5000 mg | ORAL_TABLET | Freq: Every day | ORAL | 1 refills | Status: DC

## 2020-07-07 NOTE — Telephone Encounter (Signed)
Received Fax RX request from  [place], [place] Phone:  [phone]  Fax:  [phone]       Medication - amLODipine (NORVASC) 2.5 MG tablet   Last Refill - 04/08/20  Last OV - 06/01/20  Last CPE - 11/30/19  Next Appointment - 12/01/19

## 2020-07-07 NOTE — Telephone Encounter (Signed)
Refill x 6 months 

## 2020-07-17 ENCOUNTER — Other Ambulatory Visit: Payer: Self-pay

## 2020-07-17 ENCOUNTER — Telehealth: Payer: Self-pay | Admitting: Internal Medicine

## 2020-07-17 ENCOUNTER — Encounter: Payer: Self-pay | Admitting: Internal Medicine

## 2020-07-17 ENCOUNTER — Ambulatory Visit (INDEPENDENT_AMBULATORY_CARE_PROVIDER_SITE_OTHER): Payer: Medicare Other | Admitting: Internal Medicine

## 2020-07-17 VITALS — HR 99 | Ht 68.0 in

## 2020-07-17 DIAGNOSIS — J0101 Acute recurrent maxillary sinusitis: Secondary | ICD-10-CM

## 2020-07-17 DIAGNOSIS — H8110 Benign paroxysmal vertigo, unspecified ear: Secondary | ICD-10-CM

## 2020-07-17 MED ORDER — AMOXICILLIN-POT CLAVULANATE 875-125 MG PO TABS
1.0000 | ORAL_TABLET | Freq: Two times a day (BID) | ORAL | 0 refills | Status: DC
Start: 2020-07-17 — End: 2020-10-16

## 2020-07-17 NOTE — Telephone Encounter (Signed)
Schedule phone visit / virtual visit this afternoon

## 2020-07-17 NOTE — Progress Notes (Signed)
   Subjective:    Patient ID: Victoria [name], female    DOB: [birth year], 79 y.o.   MRN: [id number]  HPI  79 year old Female called to say she never got better after being treated for acute maxillary sinusitis June 10.  She was treated with amoxicillin 500 mg 3 times a day for 10 days and a tapering course of prednisone 10 mg tablets over 6 days.  She saw Dr. Jannifer Franklin in [name] 2019.  She had an MRI of the brain in 2019 showing no acute intracranial abnormality and a few tiny remote right cerebellar infarcts.  Had chronic microvascular ischemic disease.  Had chronic sphenoid sinusitis with superimposed air-fluid level within the right sphenoid sinus suspicious for acute on chronic disease.  Was subsequently referred to ENT physician, Dr. Blenda Nicely at [facility].  Was diagnosed with chronic sinusitis and dizziness.  Was treated with prednisone and Augmentin.  Patient was told at that time had CT of sinus and or vestibular testing might be necessary and to return in 6 weeks if not improved.  She apparently never returned.  Apparently has never been allergy tested.    Patient unable to connect virtually today's telephone visit was conducted.  I am in my office and she is at home.  She is identified using 2 identifiers as Victoria [name], a patient in this practice and is agreeable to visit in this format today.  Apparently continues to have intermittent vertigo which she claims had onset when she was on a cruise in October 2018.  At that time she had significant vertigo.  Review of Systems see above     Objective:   Physical Exam Reports that she is afebrile.  Sounds nasally congested when she speaks.  Does not have discolored nasal drainage or significant postnasal drip but does have maxillary sinus pressure.  No fever or chills.  Has had 2 COVID-19 immunizations.       Assessment & Plan:   History of chronic sphenoid sinusitis seen on MRI done by neurologist  Recurrent maxillary  sinusitis based on symptoms today  History of vertigo that had onset in October 2018 and was seen by neurologist with no evidence of acute stroke to explain postural dizziness without orthostasis.  MRI and CT angio were unremarkable.  Thus was referred to ENT at that time for chronic sinusitis.  Plan: I have called in for her Augmentin 875 mg twice daily for 10 days.  If symptoms recur, she needs to return to ENT physician for further evaluation.  This appointment required 20 minutes including chart review including most recent visit, note from ENT physician, note from neurologist, medical decision making, interviewing patient, discussion regarding diagnosis and treatment and E scribing antibiotic.

## 2020-07-17 NOTE — Telephone Encounter (Addendum)
Victoria [name] [phone]  [name] called to say she is continuing to have her normal sinuitis and vertigo since she was her last that the one round of antibiotic did not help, she needs the second one called in. After really questioning her she said she has head congestion, stuffy nose, runny nose. Ears are stop up.

## 2020-07-17 NOTE — Telephone Encounter (Signed)
Scheduled phone visit.

## 2020-07-20 NOTE — Patient Instructions (Addendum)
Records have been reviewed in detail.  Have a E prescribed Augmentin 875 mg twice daily for 10 days for maxillary sinusitis.  You have a history of chronic sphenoid sinusitis based on radiology studies in 2018.  If symptoms persist or recur within a short period of time, you will need to see ENT physician once again.

## 2020-07-24 ENCOUNTER — Telehealth: Payer: Self-pay

## 2020-07-24 MED ORDER — MECLIZINE HCL 25 MG PO TABS: 25.0000 mg | ORAL_TABLET | Freq: Two times a day (BID) | ORAL | 0 refills | Status: DC | PRN

## 2020-07-24 NOTE — Telephone Encounter (Signed)
OK to refill Meclizine- Needs to go back to ENT physician she saw previously at [facility].

## 2020-07-24 NOTE — Telephone Encounter (Signed)
Patient called she said she is not better the dizziness is worse today, she is done with the prednisone but she is still on the AUGMENTIN . She wants to know if she needs to be put on on meclizine?

## 2020-07-24 NOTE — Telephone Encounter (Signed)
Patient called back to say she needed referral to ENT

## 2020-07-24 NOTE — Telephone Encounter (Signed)
I called ENT and got Appointment 08/11/2020 at 10:00 with PA because regular doctor is out on maternity leave.and also had patient put on wait list. Called and let patient know.

## 2020-08-29 DIAGNOSIS — Z23 Encounter for immunization: Secondary | ICD-10-CM | POA: Diagnosis not present

## 2020-08-31 ENCOUNTER — Other Ambulatory Visit: Payer: Self-pay | Admitting: Physician Assistant

## 2020-08-31 DIAGNOSIS — J329 Chronic sinusitis, unspecified: Secondary | ICD-10-CM | POA: Diagnosis not present

## 2020-08-31 DIAGNOSIS — R2689 Other abnormalities of gait and mobility: Secondary | ICD-10-CM | POA: Diagnosis not present

## 2020-08-31 DIAGNOSIS — H903 Sensorineural hearing loss, bilateral: Secondary | ICD-10-CM | POA: Diagnosis not present

## 2020-09-02 ENCOUNTER — Other Ambulatory Visit: Payer: Self-pay | Admitting: Internal Medicine

## 2020-09-11 ENCOUNTER — Other Ambulatory Visit: Payer: Self-pay | Admitting: Internal Medicine

## 2020-09-11 NOTE — Telephone Encounter (Signed)
Refill x 6 months 

## 2020-09-11 NOTE — Telephone Encounter (Signed)
Pended rx. Please sign, thank you.

## 2020-09-11 NOTE — Telephone Encounter (Signed)
Fatumata Kashani [phone]  [name] called to say pharmacy said for her to call doctors office for new RX for below medication.  ALPRAZolam (XANAX) 0.5 MG tablet   [id number] [address], [place] AT [place] Phone:  [phone]  Fax:  [phone]

## 2020-09-12 ENCOUNTER — Ambulatory Visit
Admission: RE | Admit: 2020-09-12 | Discharge: 2020-09-12 | Disposition: A | Discharge status: Home / Self Care | Payer: Medicare Other | Source: Ambulatory Visit | Attending: Physician Assistant | Admitting: Physician Assistant

## 2020-09-12 DIAGNOSIS — J323 Chronic sphenoidal sinusitis: Secondary | ICD-10-CM | POA: Diagnosis not present

## 2020-09-12 DIAGNOSIS — J0130 Acute sphenoidal sinusitis, unspecified: Secondary | ICD-10-CM | POA: Diagnosis not present

## 2020-09-12 DIAGNOSIS — J342 Deviated nasal septum: Secondary | ICD-10-CM | POA: Diagnosis not present

## 2020-09-12 DIAGNOSIS — J3489 Other specified disorders of nose and nasal sinuses: Secondary | ICD-10-CM | POA: Diagnosis not present

## 2020-09-12 DIAGNOSIS — J329 Chronic sinusitis, unspecified: Secondary | ICD-10-CM

## 2020-09-25 DIAGNOSIS — Z23 Encounter for immunization: Secondary | ICD-10-CM | POA: Diagnosis not present

## 2020-09-26 DIAGNOSIS — J339 Nasal polyp, unspecified: Secondary | ICD-10-CM | POA: Diagnosis not present

## 2020-09-26 DIAGNOSIS — J323 Chronic sphenoidal sinusitis: Secondary | ICD-10-CM | POA: Diagnosis not present

## 2020-09-26 DIAGNOSIS — J330 Polyp of nasal cavity: Secondary | ICD-10-CM | POA: Diagnosis not present

## 2020-10-10 ENCOUNTER — Telehealth: Payer: Self-pay | Admitting: Internal Medicine

## 2020-10-10 DIAGNOSIS — E119 Type 2 diabetes mellitus without complications: Secondary | ICD-10-CM

## 2020-10-10 MED ORDER — FREESTYLE TEST VI STRP: ORAL_STRIP | 12 refills | Status: DC

## 2020-10-10 NOTE — Telephone Encounter (Signed)
Verline Kong [phone]  [name] called to say she is having to stick her finger more often and needs her prescription sent in for 4 times a day to below pharmacy.   [facility] [place], [place] Phone:  [phone]  Fax:  [phone]

## 2020-10-16 ENCOUNTER — Other Ambulatory Visit: Payer: Self-pay

## 2020-10-16 ENCOUNTER — Emergency Department (HOSPITAL_COMMUNITY): Payer: Medicare Other

## 2020-10-16 ENCOUNTER — Encounter (HOSPITAL_COMMUNITY): Payer: Self-pay

## 2020-10-16 ENCOUNTER — Emergency Department (HOSPITAL_COMMUNITY)
Admission: EM | Admit: 2020-10-16 | Discharge: 2020-10-16 | Disposition: A | Discharge status: Home / Self Care | Payer: Medicare Other | Attending: Emergency Medicine | Admitting: Emergency Medicine

## 2020-10-16 DIAGNOSIS — E119 Type 2 diabetes mellitus without complications: Secondary | ICD-10-CM | POA: Diagnosis not present

## 2020-10-16 DIAGNOSIS — R0602 Shortness of breath: Secondary | ICD-10-CM

## 2020-10-16 DIAGNOSIS — R0901 Asphyxia: Secondary | ICD-10-CM | POA: Diagnosis not present

## 2020-10-16 DIAGNOSIS — Z20822 Contact with and (suspected) exposure to covid-19: Secondary | ICD-10-CM | POA: Diagnosis not present

## 2020-10-16 DIAGNOSIS — R42 Dizziness and giddiness: Secondary | ICD-10-CM | POA: Insufficient documentation

## 2020-10-16 DIAGNOSIS — R0981 Nasal congestion: Secondary | ICD-10-CM | POA: Diagnosis not present

## 2020-10-16 DIAGNOSIS — R059 Cough, unspecified: Secondary | ICD-10-CM | POA: Insufficient documentation

## 2020-10-16 DIAGNOSIS — J811 Chronic pulmonary edema: Secondary | ICD-10-CM | POA: Diagnosis not present

## 2020-10-16 DIAGNOSIS — I10 Essential (primary) hypertension: Secondary | ICD-10-CM | POA: Insufficient documentation

## 2020-10-16 LAB — CBC WITH DIFFERENTIAL/PLATELET
Abs Immature Granulocytes: 0.02 10*3/uL (ref 0.00–0.07)
Basophils Absolute: 0 10*3/uL (ref 0.0–0.1)
Basophils Relative: 0 %
Eosinophils Absolute: 0.1 10*3/uL (ref 0.0–0.5)
Eosinophils Relative: 1 %
HCT: 43.4 % (ref 36.0–46.0)
Hemoglobin: 14.5 g/dL (ref 12.0–15.0)
Immature Granulocytes: 0 %
Lymphocytes Relative: 47 %
Lymphs Abs: 4.9 10*3/uL — ABNORMAL HIGH (ref 0.7–4.0)
MCH: 27.6 pg (ref 26.0–34.0)
MCHC: 33.4 g/dL (ref 30.0–36.0)
MCV: 82.7 fL (ref 80.0–100.0)
Monocytes Absolute: 0.7 10*3/uL (ref 0.1–1.0)
Monocytes Relative: 6 %
Neutro Abs: 4.9 10*3/uL (ref 1.7–7.7)
Neutrophils Relative %: 46 %
Platelets: 257 10*3/uL (ref 150–400)
RBC: 5.25 MIL/uL — ABNORMAL HIGH (ref 3.87–5.11)
RDW: 15.4 % (ref 11.5–15.5)
WBC: 10.5 10*3/uL (ref 4.0–10.5)
nRBC: 0 % (ref 0.0–0.2)

## 2020-10-16 LAB — COMPREHENSIVE METABOLIC PANEL
ALT: 25 U/L (ref 0–44)
AST: 30 U/L (ref 15–41)
Albumin: 4.5 g/dL (ref 3.5–5.0)
Alkaline Phosphatase: 70 U/L (ref 38–126)
Anion gap: 13 (ref 5–15)
BUN: 18 mg/dL (ref 8–23)
CO2: 26 mmol/L (ref 22–32)
Calcium: 9.6 mg/dL (ref 8.9–10.3)
Chloride: 98 mmol/L (ref 98–111)
Creatinine, Ser: 0.94 mg/dL (ref 0.44–1.00)
GFR, Estimated: >60 mL/min (ref >60)
Glucose, Bld: 119 mg/dL — ABNORMAL HIGH (ref 70–99)
Potassium: 3.8 mmol/L (ref 3.5–5.1)
Sodium: 137 mmol/L (ref 135–145)
Total Bilirubin: 0.5 mg/dL (ref 0.3–1.2)
Total Protein: 8.3 g/dL — ABNORMAL HIGH (ref 6.5–8.1)

## 2020-10-16 LAB — TROPONIN I (HIGH SENSITIVITY)
Troponin I (High Sensitivity): 3 ng/L (ref <18)
Troponin I (High Sensitivity): 4 ng/L (ref <18)

## 2020-10-16 LAB — RESPIRATORY PANEL BY RT PCR (FLU A&B, COVID)
Influenza A by PCR: NEGATIVE
Influenza B by PCR: NEGATIVE
SARS Coronavirus 2 by RT PCR: NEGATIVE

## 2020-10-16 MED ORDER — AMOXICILLIN-POT CLAVULANATE 875-125 MG PO TABS: 1.0000 | ORAL_TABLET | Freq: Two times a day (BID) | ORAL | 0 refills | Status: AC

## 2020-10-16 MED ORDER — FLUTICASONE PROPIONATE 50 MCG/ACT NA SUSP: 2.0000 | Freq: Every day | NASAL | 0 refills | Status: DC

## 2020-10-16 NOTE — ED Triage Notes (Addendum)
Patient c/o SOB, cough, and dizziness "Off and on for a year." Patient could not give any clear answers about her condition.  Patient states she went to a neurologist a year ago and was told it was not vertigo. Patient added that she has been having "tremors like Parkinson's for over a year and they are getting worse."   Patient states, "I want a Ct Scan and I am not going home until I am taken care of." Patient voiced anger when she was told that was up to the physician if she got a CT scan or not.

## 2020-10-16 NOTE — Discharge Instructions (Signed)
You were seen in the emergency room today with episodic lightheadedness and sinus congestion.  Your ED work-up today did not show evidence of strain or injury to the heart.  Your Covid test is negative.  I am starting you on Augmentin as we discussed for possible return of sinus infection.  Please follow closely with Dr. Redmond Baseman for your surgery next month.  Return to the emergency department any new or suddenly worsening symptoms.

## 2020-10-16 NOTE — ED Provider Notes (Signed)
Emergency Department Provider Note   I have reviewed the triage vital signs and the nursing notes.   HISTORY  Chief Complaint Shortness of Breath, Cough, and Dizziness   HPI Victoria [name] is a 79 y.o. female with PMH reviewed below presents to the ED with dizziness and face pain worsening over the past several weeks.  She has had over 1 year of symptoms altogether.  She describes feeling lightheaded but denies specific vertigo symptoms.  She is not experiencing unilateral weakness or numbness.  She feels lightheaded mainly like she may pass out.  She describes some cough developing over the past several days with some mild shortness of breath.  She was not having specific chest pain, or palpitations, or passing out.  She followed up with ENT after CT imaging of her sinuses and September and has surgery planned for late November with Dr. Redmond Baseman to remove polyps in her sinuses. Patient last took Augmentin in June after this was prescribed. She tells me that 2 weeks ago she took a brief course of Amoxicillin for symptoms but nothing since. No vision changes. No speech changes. No radiation of symptoms or modifying factors.    Past Medical History:  Diagnosis Date  . Abnormal finding on Pap smear   . Allergy    SEASONAL  . Anxiety   . Breast cancer ([place])   . Cataract    BILATERAL REMOVED,HAVE IMPLANTS  . Cholelithiasis 11-2012  . Cirrhosis ([place]) 11-2012  . Diabetes mellitus   . Gallstones   . Hepatitis C 11-2012   Active  . Hypertension   . Obesity   . Pancreatitis, acute 11-2012  . Thrombocytopenia ([place]) 11-2012    Patient Active Problem List   Diagnosis Date Noted  . History of pancreatitis 05/07/2013  . Status post cholecystectomy 05/07/2013  . Hepatitis C, chronic ([place]) 01/19/2013  . Thrombocytopenia ([place]) 12/14/2012  . Cirrhosis ([place]) 12/14/2012  . Allergic rhinitis 10/05/2012  . Diabetes mellitus ([place]) 10/07/2011  . Hypertension 10/07/2011  . History of breast  cancer 10/07/2011  . Anxiety 10/07/2011    Past Surgical History:  Procedure Laterality Date  . BREAST LUMPECTOMY  right  . BREAST LUMPECTOMY    . BREAST REDUCTION SURGERY  2005  . CATARACT EXTRACTION, BILATERAL    . CHOLECYSTECTOMY N/A 03/30/2013   Procedure: LAPAROSCOPIC CHOLECYSTECTOMY WITH INTRAOPERATIVE CHOLANGIOGRAM;  Surgeon: Shann Medal, MD;  Location: [facility];  Service: General;  Laterality: N/A;  . COLONOSCOPY    . EUS  01/28/2013   Procedure: UPPER ENDOSCOPIC ULTRASOUND (EUS) LINEAR;  Surgeon: Milus Banister, MD;  Location: [facility];  Service: Endoscopy;  Laterality: N/A;  ride-adrian  [place] [phone]  . EXCISION OF SKIN TAG Right 03/30/2013   Procedure: EXCISION OF SKIN TAG;  Surgeon: Shann Medal, MD;  Location: [facility];  Service: General;  Laterality: Right;  . LIVER BIOPSY  03/30/2013   Procedure: LIVER BIOPSY;  Surgeon: Shann Medal, MD;  Location: [facility];  Service: General;;  . POLYPECTOMY    . THYROIDECTOMY, PARTIAL  1972   left  . vertigo      Allergies Lisinopril  Family History  Problem Relation Age of Onset  . Diabetes Mother   . Stroke Mother   . Other Sister   . Colon cancer Sister 59  . Colon polyps Neg Hx   . Esophageal cancer Neg Hx   . Rectal cancer Neg Hx   . Stomach cancer Neg Hx   . Pancreatic  cancer Neg Hx   . Prostate cancer Neg Hx     Social History Social History   Tobacco Use  . Smoking status: Never Smoker  . Smokeless tobacco: Never Used  Vaping Use  . Vaping Use: Never used  Substance Use Topics  . Alcohol use: No  . Drug use: No    Review of Systems  Constitutional: No fever/chills. Positive lightheadedness intermittently.  Eyes: No visual changes.  ENT: No sore throat. Face pain and congestion.  Cardiovascular: Denies chest pain. Respiratory: Mild shortness of breath and cough.  Gastrointestinal: No abdominal pain.  No nausea, no vomiting.  No diarrhea.  No constipation. Genitourinary: Negative for  dysuria. Musculoskeletal: Negative for back pain. Skin: Negative for rash. Neurological: Negative for headaches, focal weakness or numbness.  10-point ROS otherwise negative.  ____________________________________________   PHYSICAL EXAM:  VITAL SIGNS: ED Triage Vitals  Enc Vitals Group     BP 10/16/20 1445 (!) 137/95     Pulse Rate 10/16/20 1445 90     Resp 10/16/20 1445 16     Temp 10/16/20 1445 98.6 F (37 C)     Temp Source 10/16/20 1445 Oral     SpO2 10/16/20 1445 97 %     Weight 10/16/20 1446 224 lb (101.6 kg)     Height 10/16/20 1446 5\' 8"  (1.727 m)   Constitutional: Alert and oriented. Well appearing and in no acute distress. Eyes: Conjunctivae are normal.  Head: Atraumatic. Nose: Mild congestion/rhinnorhea. Maxillary tenderness diffusely.  Mouth/Throat: Mucous membranes are moist.  Neck: No stridor. Cardiovascular: Normal rate, regular rhythm. Good peripheral circulation. Grossly normal heart sounds.   Respiratory: Normal respiratory effort.  No retractions. Lungs CTAB. Gastrointestinal: Soft and nontender. No distention.  Musculoskeletal: No lower extremity tenderness nor edema. No gross deformities of extremities. Neurologic:  Normal speech and language. No gross focal neurologic deficits are appreciated. 5/5 strength bilaterally. Normal heel to shin testing.  Skin:  Skin is warm, dry and intact. No rash noted.  ____________________________________________   LABS (all labs ordered are listed, but only abnormal results are displayed)  Labs Reviewed  COMPREHENSIVE METABOLIC PANEL - Abnormal; Notable for the following components:      Result Value   Glucose, Bld 119 (*)    Total Protein 8.3 (*)    All other components within normal limits  CBC WITH DIFFERENTIAL/PLATELET - Abnormal; Notable for the following components:   RBC 5.25 (*)    Lymphs Abs 4.9 (*)    All other components within normal limits  RESPIRATORY PANEL BY RT PCR (FLU A&B, COVID)  TROPONIN I  (HIGH SENSITIVITY)  TROPONIN I (HIGH SENSITIVITY)   ____________________________________________  EKG   EKG Interpretation  Date/Time:  Monday October 16 2020 17:52:11 EDT Ventricular Rate:  75 PR Interval:    QRS Duration: 96 QT Interval:  374 QTC Calculation: 418 R Axis:   -69 Text Interpretation: Sinus rhythm Left anterior fascicular block Probable anterior infarct, age indeterminate No STEMI Confirmed by Nanda Quinton [phone]) on 10/16/2020 6:06:04 PM       ____________________________________________  RADIOLOGY  CXR reviewed.  ____________________________________________   PROCEDURES  Procedure(s) performed:   Procedures  None ____________________________________________   INITIAL IMPRESSION / ASSESSMENT AND PLAN / ED COURSE  Pertinent labs & imaging results that were available during my care of the patient were reviewed by me and considered in my medical decision making (see chart for details).   Patient presents to the emergency department with face pain with lightheadedness,  shortness of breath, cough.  Plan for work-up to look beyond possible sinusitis.  Patient has a completely intact neuro exam and I do not plan on neuroimaging here.  She is not having vertigo to suspect BPPV.  Plan for respiratory panel along with chest x-ray, labs to evaluate for possible atypical ACS although this is lower on my differential.  She has outpatient follow-up with ENT as well as her PCP.   CXR along with labs reviewed. Plan for ENT follow up. Discussed ED return precautions.   At this time, I do not feel there is any life-threatening condition present. I have reviewed and discussed all results (EKG, imaging, lab, urine as appropriate), exam findings with patient. I have reviewed nursing notes and appropriate previous records.  I feel the patient is safe to be discharged home without further emergent workup. Discussed usual and customary return precautions. Patient and family (if  present) verbalize understanding and are comfortable with this plan.  Patient will follow-up with their primary care provider. If they do not have a primary care provider, information for follow-up has been provided to them. All questions have been answered.  ____________________________________________  FINAL CLINICAL IMPRESSION(S) / ED DIAGNOSES  Final diagnoses:  Episodic lightheadedness  Sinus congestion  Shortness of breath     NEW OUTPATIENT MEDICATIONS STARTED DURING THIS VISIT:  Discharge Medication List as of 10/16/2020  9:31 PM    START taking these medications   Details  fluticasone (FLONASE) 50 MCG/ACT nasal spray Place 2 sprays into both nostrils daily., Starting Mon 10/16/2020, Normal        Note:  This document was prepared using Dragon voice recognition software and may include unintentional dictation errors.  Nanda Quinton, MD, [facility]    Grace Haggart, [facility], MD 10/17/20 [phone]

## 2020-11-15 DIAGNOSIS — Z01818 Encounter for other preprocedural examination: Secondary | ICD-10-CM | POA: Diagnosis not present

## 2020-11-20 ENCOUNTER — Other Ambulatory Visit: Payer: Self-pay | Admitting: Otolaryngology

## 2020-11-20 DIAGNOSIS — J323 Chronic sphenoidal sinusitis: Secondary | ICD-10-CM | POA: Diagnosis not present

## 2020-11-20 DIAGNOSIS — J329 Chronic sinusitis, unspecified: Secondary | ICD-10-CM | POA: Diagnosis not present

## 2020-11-20 DIAGNOSIS — J338 Other polyp of sinus: Secondary | ICD-10-CM | POA: Diagnosis not present

## 2020-11-24 ENCOUNTER — Emergency Department (HOSPITAL_COMMUNITY)
Admission: EM | Admit: 2020-11-24 | Discharge: 2020-11-24 | Disposition: A | Discharge status: Home / Self Care | Payer: Medicare Other | Attending: Emergency Medicine | Admitting: Emergency Medicine

## 2020-11-24 ENCOUNTER — Encounter (HOSPITAL_COMMUNITY): Payer: Self-pay | Admitting: Emergency Medicine

## 2020-11-24 ENCOUNTER — Other Ambulatory Visit: Payer: Self-pay

## 2020-11-24 DIAGNOSIS — R040 Epistaxis: Secondary | ICD-10-CM | POA: Insufficient documentation

## 2020-11-24 DIAGNOSIS — Z79899 Other long term (current) drug therapy: Secondary | ICD-10-CM | POA: Insufficient documentation

## 2020-11-24 DIAGNOSIS — R457 State of emotional shock and stress, unspecified: Secondary | ICD-10-CM | POA: Diagnosis not present

## 2020-11-24 DIAGNOSIS — Z853 Personal history of malignant neoplasm of breast: Secondary | ICD-10-CM | POA: Insufficient documentation

## 2020-11-24 DIAGNOSIS — I10 Essential (primary) hypertension: Secondary | ICD-10-CM | POA: Diagnosis not present

## 2020-11-24 DIAGNOSIS — Z7984 Long term (current) use of oral hypoglycemic drugs: Secondary | ICD-10-CM | POA: Diagnosis not present

## 2020-11-24 DIAGNOSIS — E119 Type 2 diabetes mellitus without complications: Secondary | ICD-10-CM | POA: Insufficient documentation

## 2020-11-24 DIAGNOSIS — R000 Tachycardia, unspecified: Secondary | ICD-10-CM | POA: Diagnosis not present

## 2020-11-24 DIAGNOSIS — R450 Nervousness: Secondary | ICD-10-CM | POA: Diagnosis not present

## 2020-11-24 LAB — CBC
HCT: 40.9 % (ref 36.0–46.0)
Hemoglobin: 13.5 g/dL (ref 12.0–15.0)
MCH: 27.1 pg (ref 26.0–34.0)
MCHC: 33 g/dL (ref 30.0–36.0)
MCV: 82 fL (ref 80.0–100.0)
Platelets: 231 10*3/uL (ref 150–400)
RBC: 4.99 MIL/uL (ref 3.87–5.11)
RDW: 14.5 % (ref 11.5–15.5)
WBC: 9.8 10*3/uL (ref 4.0–10.5)
nRBC: 0 % (ref 0.0–0.2)

## 2020-11-24 LAB — URINALYSIS, ROUTINE W REFLEX MICROSCOPIC
Bilirubin Urine: NEGATIVE
Glucose, UA: NEGATIVE mg/dL
Hgb urine dipstick: NEGATIVE
Ketones, ur: NEGATIVE mg/dL
Nitrite: NEGATIVE
Protein, ur: NEGATIVE mg/dL
Specific Gravity, Urine: 1.017 (ref 1.005–1.030)
pH: 5 (ref 5.0–8.0)

## 2020-11-24 LAB — CBG MONITORING, ED: Glucose-Capillary: 115 mg/dL — ABNORMAL HIGH (ref 70–99)

## 2020-11-24 LAB — BASIC METABOLIC PANEL
Anion gap: 11 (ref 5–15)
BUN: 14 mg/dL (ref 8–23)
CO2: 26 mmol/L (ref 22–32)
Calcium: 9.1 mg/dL (ref 8.9–10.3)
Chloride: 100 mmol/L (ref 98–111)
Creatinine, Ser: 0.88 mg/dL (ref 0.44–1.00)
GFR, Estimated: >60 mL/min (ref >60)
Glucose, Bld: 122 mg/dL — ABNORMAL HIGH (ref 70–99)
Potassium: 3.6 mmol/L (ref 3.5–5.1)
Sodium: 137 mmol/L (ref 135–145)

## 2020-11-24 MED ORDER — OXYMETAZOLINE HCL 0.05 % NA SOLN
1.0000 | Freq: Once | NASAL | Status: AC
  Administered 2020-11-24: 1 via NASAL
  Filled 2020-11-24: qty 30

## 2020-11-24 NOTE — ED Triage Notes (Signed)
Pt arriving with complaint of nose bleed. Pt reports having sinus surgery Monday to remove polyps. Pt denies any dizziness or headache. Pt was concerned because of the size of clots that came out.

## 2020-11-24 NOTE — ED Provider Notes (Signed)
[facility] DEPT Provider Note   CSN: [id number] Arrival date & time: 11/24/20  1405     History Chief Complaint  Patient presents with  . Epistaxis  . Weakness    Victoria [name] is a 79 y.o. female presents emergency department chief complaint of epistaxis.  The patient had a polyp removal out of the left side of her nose on Monday.  She has had some mild bleeding since that but today states that she had severe bleeding coming out of both nostrils and mouth.  She was brought to the emergency department.  She had an extensive wait in the ER lobby due to long wait times however upon my evaluation epistaxis has stopped.  She has been doing saline and a humidifier at home. The history is provided by the patient.       Past Medical History:  Diagnosis Date  . Abnormal finding on Pap smear   . Allergy    SEASONAL  . Anxiety   . Breast cancer ([place])   . Cataract    BILATERAL REMOVED,HAVE IMPLANTS  . Cholelithiasis 11-2012  . Cirrhosis ([place]) 11-2012  . Diabetes mellitus   . Gallstones   . Hepatitis C 11-2012   Active  . Hypertension   . Obesity   . Pancreatitis, acute 11-2012  . Thrombocytopenia ([place]) 11-2012    Patient Active Problem List   Diagnosis Date Noted  . History of pancreatitis 05/07/2013  . Status post cholecystectomy 05/07/2013  . Hepatitis C, chronic ([place]) 01/19/2013  . Thrombocytopenia ([place]) 12/14/2012  . Cirrhosis ([place]) 12/14/2012  . Allergic rhinitis 10/05/2012  . Diabetes mellitus ([place]) 10/07/2011  . Hypertension 10/07/2011  . History of breast cancer 10/07/2011  . Anxiety 10/07/2011    Past Surgical History:  Procedure Laterality Date  . BREAST LUMPECTOMY  right  . BREAST LUMPECTOMY    . BREAST REDUCTION SURGERY  2005  . CATARACT EXTRACTION, BILATERAL    . CHOLECYSTECTOMY N/A 03/30/2013   Procedure: LAPAROSCOPIC CHOLECYSTECTOMY WITH INTRAOPERATIVE CHOLANGIOGRAM;  Surgeon: Shann Medal, MD;  Location: [facility];   Service: General;  Laterality: N/A;  . COLONOSCOPY    . EUS  01/28/2013   Procedure: UPPER ENDOSCOPIC ULTRASOUND (EUS) LINEAR;  Surgeon: Milus Banister, MD;  Location: [facility];  Service: Endoscopy;  Laterality: N/A;  ride-adrian  [place] [phone]  . EXCISION OF SKIN TAG Right 03/30/2013   Procedure: EXCISION OF SKIN TAG;  Surgeon: Shann Medal, MD;  Location: [facility];  Service: General;  Laterality: Right;  . LIVER BIOPSY  03/30/2013   Procedure: LIVER BIOPSY;  Surgeon: Shann Medal, MD;  Location: [facility];  Service: General;;  . POLYPECTOMY    . THYROIDECTOMY, PARTIAL  1972   left  . vertigo       OB History   No obstetric history on file.     Family History  Problem Relation Age of Onset  . Diabetes Mother   . Stroke Mother   . Other Sister   . Colon cancer Sister 16  . Colon polyps Neg Hx   . Esophageal cancer Neg Hx   . Rectal cancer Neg Hx   . Stomach cancer Neg Hx   . Pancreatic cancer Neg Hx   . Prostate cancer Neg Hx     Social History   Tobacco Use  . Smoking status: Never Smoker  . Smokeless tobacco: Never Used  Vaping Use  . Vaping Use: Never used  Substance Use Topics  .  Alcohol use: No  . Drug use: No    Home Medications Prior to Admission medications   Medication Sig Start Date End Date Taking? Authorizing Provider  ALPRAZolam (XANAX) 0.5 MG tablet TAKE 1 TABLET(0.5 MG) BY MOUTH TWICE DAILY AS NEEDED FOR ANXIETY Patient taking differently: Take 0.5 mg by mouth 2 (two) times daily as needed for anxiety or sleep.  09/11/20   Elby Showers, MD  amLODipine (NORVASC) 2.5 MG tablet Take 1 tablet (2.5 mg total) by mouth at bedtime. 07/07/20   Elby Showers, MD  Cetirizine HCl (ZYRTEC ALLERGY PO) Take 1 tablet by mouth daily as needed (allergies).     [provider]  cholecalciferol (VITAMIN D3) 25 MCG (1000 UNIT) tablet Take 1,000 Units by mouth daily.    [provider]  Continuous Blood Gluc Sensor (FREESTYLE LIBRE SENSOR SYSTEM)  MISC Check glucose twice a day 02/02/20   Elby Showers, MD  ELDERBERRY PO Take 1 tablet by mouth daily.    [provider]  fluticasone (FLONASE) 50 MCG/ACT nasal spray Place 2 sprays into both nostrils daily. 10/16/20   Long, Wonda Olds, MD  fluticasone (FLONASE) 50 MCG/ACT nasal spray Place 1 spray into both nostrils daily as needed for allergies or rhinitis.    [provider]  glucose blood (FREESTYLE TEST STRIPS) test strip CHECK GLUCOSE FOUR TIMES A DAY 10/10/20   Elby Showers, MD  ibuprofen (ADVIL) 100 MG tablet Take 100 mg by mouth every 6 (six) hours as needed for pain or fever.    [provider]  Lancets (FREESTYLE) lancets CHECK GLUCOSE TWICE A DAY 01/26/20   Elby Showers, MD  losartan-hydrochlorothiazide [facility]) 100-25 MG tablet TAKE 1 TABLET BY MOUTH DAILY 09/03/20   Elby Showers, MD  meclizine (ANTIVERT) 25 MG tablet Take 1 tablet (25 mg total) by mouth 2 (two) times daily as needed for dizziness. Patient not taking: Reported on 10/16/2020 07/24/20   Elby Showers, MD  metFORMIN (GLUCOPHAGE) 500 MG tablet TAKE 1 TABLET(500 MG) BY MOUTH TWICE DAILY WITH A MEAL Patient taking differently: Take 500 mg by mouth 2 (two) times daily with a meal.  09/03/20   Baxley, Cresenciano Lick, MD  neomycin-polymyxin-hydrocortisone (CORTISPORIN) OTIC solution Place 4 drops into both ears at bedtime. 06/01/20   Elby Showers, MD    Allergies    Lisinopril  Review of Systems   Review of Systems Ten systems reviewed and are negative for acute change, except as noted in the HPI.   Physical Exam Updated Vital Signs BP (!) 134/105 (BP Location: Left Arm)   Pulse 90   Temp 98.4 F (36.9 C) (Oral)   Resp 15   SpO2 99%   Physical Exam Physical Exam  Nursing note and vitals reviewed. Constitutional: She is oriented to person, place, and time. She appears well-developed and well-nourished. No distress.  HENT:  Head: Normocephalic and atraumatic.  Eyes: Conjunctivae normal and  EOM are normal. Pupils are equal, round, and reactive to light. No scleral icterus.  Nose: Positive for epistaxis, dried blood on the left.  No active bleeding, no blood in the oropharynx Neck: Normal range of motion.  Cardiovascular: Normal rate, regular rhythm and normal heart sounds.  Exam reveals no gallop and no friction rub.   No murmur heard. Pulmonary/Chest: Effort normal and breath sounds normal. No respiratory distress.  Abdominal: Soft. Bowel sounds are normal. She exhibits no distension and no mass. There is no tenderness. There is  no guarding.  Neurological: She is alert and oriented to person, place, and time.  Skin: Skin is warm and dry. She is not diaphoretic.  Psych: Anxious mood  ED Results / Procedures / Treatments   Labs (all labs ordered are listed, but only abnormal results are displayed) Labs Reviewed  BASIC METABOLIC PANEL - Abnormal; Notable for the following components:      Result Value   Glucose, Bld 122 (*)    All other components within normal limits  URINALYSIS, ROUTINE W REFLEX MICROSCOPIC - Abnormal; Notable for the following components:   Leukocytes,Ua TRACE (*)    Bacteria, UA RARE (*)    All other components within normal limits  CBG MONITORING, ED - Abnormal; Notable for the following components:   Glucose-Capillary 115 (*)    All other components within normal limits  CBC  CBG MONITORING, ED    EKG None  Radiology No results found.  Procedures Procedures (including critical care time)  Medications Ordered in ED Medications  oxymetazoline (AFRIN) 0.05 % nasal spray 1 spray (1 spray Each Nare Given 11/24/20 2157)    ED Course  I have reviewed the triage vital signs and the nursing notes.  Pertinent labs & imaging results that were available during my care of the patient were reviewed by me and considered in my medical decision making (see chart for details).    MDM Rules/Calculators/A&P                          Patient here with  epistaxis after recent nasal procedure.  No active bleeding at this time.  Initially the patient had some tachycardia however after ordering and reviewing labs her CBC shows no abnormalities or drop in her hemoglobin.  Her vital signs have normalized, BMP with mildly elevated glucose of insignificant value, urine without evidence of infection. EKG shows normal sinus rhythm at a rate of 94.  I consulted with Dr. Isaias Cowman who is on-call for [facility] ear nose and throat.  She recommends Afrin, nasal saline and humidifier and close outpatient follow-up.  I discussed all findings with the patient answered questions to my ability, discussed nasal hygiene, epistaxis home management and return precautions.  She appears otherwise appropriate for discharge at this time Final Clinical Impression(s) / ED Diagnoses Final diagnoses:  Epistaxis    Rx / DC Orders ED Discharge Orders    None       Margarita Mail, PA-C 11/24/20 2226    Isla Pence, MD 11/24/20 2252

## 2020-11-24 NOTE — ED Notes (Addendum)
Again while rechecking v/s in lobby, pt states she is feeling weak and just really needs to know if something wasn't closed properly during her surgery on Monday. Pt advises that there is gauze in her nostril(s) and she has been putting tissue in her mask to catch blood and clots.  I was able to visualize the tissues and pt is still bleeding/passing clots. Triage RN notified and pt brought back to triage room 1 for further assessment.

## 2020-11-24 NOTE — ED Notes (Signed)
While updating v/s in lobby, pt advised that she has a headache and feels fatigued. Pt requesting that her CBG be checked.  Triage RN notified.

## 2020-11-24 NOTE — Discharge Instructions (Addendum)
If your nose begins to bleed again, apply Afrin to gauze and it in 1 or both nostrils.  Apply direct pressure to the soft part of your nose for at least 20 minutes.  If you cannot stop the bleeding please dial 911. Contact a health care provider if: You have a fever. You get nosebleeds often or more often than usual. You bruise very easily. You have a nosebleed from having something stuck in your nose. You have bleeding in your mouth. You vomit or cough up brown material. You have a nosebleed after you start a new medicine. Get help right away if: You have a nosebleed after a fall or a head injury. Your nosebleed does not go away after 20 minutes. You feel dizzy or weak. You have unusual bleeding from other parts of your body. You have unusual bruising on other parts of your body. You become sweaty. You vomit blood.  Nasal Hygiene The nose has many positive effects on the air you breathe in that you may not be aware of. - Temperature regulation - Filtration and removal of particulate matter - Humidification - Defense against infections There are several things you can do to help keep your nose healthy. Foremost is nasal hygiene. This will help with your nose's natural function and keep it moist and healthy. Techniques to accomplish this are outlined below. These will help with nasal dryness, nasal crusting, and nose bleeds. They also assist with clearing thick mucus that cause you to blow your nose frequently and may be associated with thick postnasal drip.  1. Use nasal saline daily. You can buy small bottles of this over-the-counter at the drug store or grocery store. Some brand names are: [place], [place], [place]. Apply 2-3 sprays each nostril several times a day. If your nose feels dry or have had recent nasal surgery, try to use it every couple of hours. There is no medicine in it so it can be used as often as you like. Do NOT use the sprays containing decongestants. The  appropriate way to apply nasal sprays: Place the nozzle just inside your nostril and point it towards the corner of your eye. Often, it is helpful to use the right-hand to spray into the left nasal cavity and use the left-hand to spray into the right side.  2. Use nasal saline irrigations and flushing.SinuCleanse, Simply Saline, [place]. Use this 1-2 times a day. We can also provide you with a recipe to use at home. Just ask [place]. To prevent reintroducing bacteria back into your nose, please keep your irrigation equipment clean and dry between uses. Throw away and replace reusable irrigation equipment every 3 weeks.   3. Use Vaseline petroleum jelly or Aquaphor. You can apply this gently to each nostril 2-3 times a day to promote moisturization for your nose. You may also use triple antibiotic ointment such as Neosporin or Bacitracin. These can all be bought over-the-counter.  4. Consider other nasal emollients. A few preparations are available over-thecounter. Ponaris, Nose Better, Pretz. Ask your pharmacist what is available. Also, some nasal saline sprays have additives such as aloe and these are helpful.  5. Consider using a humidifier at home. If your nose feels dry and/or you have frequent nose bleeds, you can buy a humidifier for your home. Be cautious in using these if you have mold allergies.  6. Avoid excessive manual manipulation of your nose and nostrils. Frequent rubbing of your nostrils and the passing of tissues or fingers in  your nostrils may aggravate nasal irritation from dryness and nose bleeds.

## 2020-11-24 NOTE — ED Notes (Signed)
Pt stopped staff member to inform them that she has a headache and feels fatigued. Pt requesting her CBG be checked.

## 2020-11-25 ENCOUNTER — Emergency Department (HOSPITAL_COMMUNITY)
Admission: EM | Admit: 2020-11-25 | Discharge: 2020-11-25 | Disposition: A | Discharge status: Home / Self Care | Payer: Medicare Other | Attending: Emergency Medicine | Admitting: Emergency Medicine

## 2020-11-25 ENCOUNTER — Encounter (HOSPITAL_COMMUNITY): Payer: Self-pay | Admitting: *Deleted

## 2020-11-25 ENCOUNTER — Other Ambulatory Visit: Payer: Self-pay

## 2020-11-25 DIAGNOSIS — E119 Type 2 diabetes mellitus without complications: Secondary | ICD-10-CM | POA: Insufficient documentation

## 2020-11-25 DIAGNOSIS — Z853 Personal history of malignant neoplasm of breast: Secondary | ICD-10-CM | POA: Diagnosis not present

## 2020-11-25 DIAGNOSIS — R040 Epistaxis: Secondary | ICD-10-CM | POA: Insufficient documentation

## 2020-11-25 DIAGNOSIS — I10 Essential (primary) hypertension: Secondary | ICD-10-CM | POA: Diagnosis not present

## 2020-11-25 DIAGNOSIS — Z79899 Other long term (current) drug therapy: Secondary | ICD-10-CM | POA: Insufficient documentation

## 2020-11-25 DIAGNOSIS — Z7984 Long term (current) use of oral hypoglycemic drugs: Secondary | ICD-10-CM | POA: Insufficient documentation

## 2020-11-25 LAB — CBC WITH DIFFERENTIAL/PLATELET
Abs Immature Granulocytes: 0.04 10*3/uL (ref 0.00–0.07)
Basophils Absolute: 0 10*3/uL (ref 0.0–0.1)
Basophils Relative: 0 %
Eosinophils Absolute: 0.1 10*3/uL (ref 0.0–0.5)
Eosinophils Relative: 1 %
HCT: 41.8 % (ref 36.0–46.0)
Hemoglobin: 13.6 g/dL (ref 12.0–15.0)
Immature Granulocytes: 0 %
Lymphocytes Relative: 31 %
Lymphs Abs: 3.6 10*3/uL (ref 0.7–4.0)
MCH: 27 pg (ref 26.0–34.0)
MCHC: 32.5 g/dL (ref 30.0–36.0)
MCV: 82.9 fL (ref 80.0–100.0)
Monocytes Absolute: 0.7 10*3/uL (ref 0.1–1.0)
Monocytes Relative: 6 %
Neutro Abs: 7.3 10*3/uL (ref 1.7–7.7)
Neutrophils Relative %: 62 %
Platelets: 231 10*3/uL (ref 150–400)
RBC: 5.04 MIL/uL (ref 3.87–5.11)
RDW: 15 % (ref 11.5–15.5)
WBC: 11.7 10*3/uL — ABNORMAL HIGH (ref 4.0–10.5)
nRBC: 0 % (ref 0.0–0.2)

## 2020-11-25 LAB — CBG MONITORING, ED: Glucose-Capillary: 129 mg/dL — ABNORMAL HIGH (ref 70–99)

## 2020-11-25 MED ORDER — OXYMETAZOLINE HCL 0.05 % NA SOLN: 1.0000 | Freq: Once | NASAL | Status: DC

## 2020-11-25 NOTE — ED Notes (Signed)
Pt says she has her afrin from [facility] she will use. No active bleeding noted in the left nare in triage.

## 2020-11-25 NOTE — ED Triage Notes (Signed)
Pt with onset of left side nose bleed around 4 am. [facility], says she has used afrin since, but still having bleeding. Pt with recent polyps removal and called on call ENT, told to come here for eval

## 2020-11-25 NOTE — Discharge Instructions (Signed)
Call your primary care doctor or specialist as discussed in the next 2-3 days.   Return immediately back to the ER if:  Your symptoms worsen within the next 12-24 hours. You develop new symptoms such as new fevers, persistent vomiting, new pain, shortness of breath, or new weakness or numbness, or if you have any other concerns.  

## 2020-11-25 NOTE — ED Provider Notes (Signed)
[facility] EMERGENCY DEPARTMENT Provider Note   CSN: [id number] Arrival date & time: 11/25/20  4174     History Chief Complaint  Patient presents with  . Epistaxis    Victoria [name] is a 79 y.o. female.  Patient presents with chief complaint of left-sided bloody nose.  Patient states she had nasal polyp removal by ENT approximately November 20, 2020.  She has noticed bleeding from her nose yesterday.  She was seen at outside ER, prescribed Afrin which she states has helped.  However she had recurrence of bleeding this morning around 4 AM.  Bleeding was ongoing for about 2 hours before stopping.  She spoke with ENT on-call was advised to come to the ER for further evaluation.  Otherwise denies any fevers vomiting cough diarrhea.  Denies headache or chest pain or dizziness.        Past Medical History:  Diagnosis Date  . Abnormal finding on Pap smear   . Allergy    SEASONAL  . Anxiety   . Breast cancer ([place])   . Cataract    BILATERAL REMOVED,HAVE IMPLANTS  . Cholelithiasis 11-2012  . Cirrhosis ([place]) 11-2012  . Diabetes mellitus   . Gallstones   . Hepatitis C 11-2012   Active  . Hypertension   . Obesity   . Pancreatitis, acute 11-2012  . Thrombocytopenia ([place]) 11-2012    Patient Active Problem List   Diagnosis Date Noted  . History of pancreatitis 05/07/2013  . Status post cholecystectomy 05/07/2013  . Hepatitis C, chronic ([place]) 01/19/2013  . Thrombocytopenia ([place]) 12/14/2012  . Cirrhosis ([place]) 12/14/2012  . Allergic rhinitis 10/05/2012  . Diabetes mellitus ([place]) 10/07/2011  . Hypertension 10/07/2011  . History of breast cancer 10/07/2011  . Anxiety 10/07/2011    Past Surgical History:  Procedure Laterality Date  . BREAST LUMPECTOMY  right  . BREAST LUMPECTOMY    . BREAST REDUCTION SURGERY  2005  . CATARACT EXTRACTION, BILATERAL    . CHOLECYSTECTOMY N/A 03/30/2013   Procedure: LAPAROSCOPIC CHOLECYSTECTOMY WITH INTRAOPERATIVE  CHOLANGIOGRAM;  Surgeon: Shann Medal, MD;  [facility];  Service: General;  Laterality: N/A;  . COLONOSCOPY    . EUS  01/28/2013   Procedure: UPPER ENDOSCOPIC ULTRASOUND (EUS) LINEAR;  Surgeon: Milus Banister, MD;  [facility];  Service: Endoscopy;  Laterality: N/A;  ride-adrian  [place] [phone]  . EXCISION OF SKIN TAG Right 03/30/2013   Procedure: EXCISION OF SKIN TAG;  Surgeon: Shann Medal, MD;  [facility];  Service: General;  Laterality: Right;  . LIVER BIOPSY  03/30/2013   Procedure: LIVER BIOPSY;  Surgeon: Shann Medal, MD;  [facility];  Service: General;;  . POLYPECTOMY    . THYROIDECTOMY, PARTIAL  1972   left  . vertigo       OB History   No obstetric history on file.     Family History  Problem Relation Age of Onset  . Diabetes Mother   . Stroke Mother   . Other Sister   . Colon cancer Sister 32  . Colon polyps Neg Hx   . Esophageal cancer Neg Hx   . Rectal cancer Neg Hx   . Stomach cancer Neg Hx   . Pancreatic cancer Neg Hx   . Prostate cancer Neg Hx     Social History   Tobacco Use  . Smoking status: Never Smoker  . Smokeless tobacco: Never Used  Vaping Use  . Vaping Use: Never used  Substance Use Topics  . Alcohol use: No  . Drug use: No    Home Medications Prior to Admission medications   Medication Sig Start Date End Date Taking? Authorizing Provider  ALPRAZolam (XANAX) 0.5 MG tablet TAKE 1 TABLET(0.5 MG) BY MOUTH TWICE DAILY AS NEEDED FOR ANXIETY Patient taking differently: Take 0.5 mg by mouth 2 (two) times daily as needed for anxiety or sleep.  09/11/20   Elby Showers, MD  amLODipine (NORVASC) 2.5 MG tablet Take 1 tablet (2.5 mg total) by mouth at bedtime. 07/07/20   Elby Showers, MD  Cetirizine HCl (ZYRTEC ALLERGY PO) Take 1 tablet by mouth daily as needed (allergies).     [provider]  cholecalciferol (VITAMIN D3) 25 MCG (1000 UNIT) tablet Take 1,000 Units by mouth daily.    [provider]   Continuous Blood Gluc Sensor (FREESTYLE LIBRE SENSOR SYSTEM) MISC Check glucose twice a day 02/02/20   Elby Showers, MD  ELDERBERRY PO Take 1 tablet by mouth daily.    [provider]  fluticasone (FLONASE) 50 MCG/ACT nasal spray Place 2 sprays into both nostrils daily. 10/16/20   Long, Wonda Olds, MD  fluticasone (FLONASE) 50 MCG/ACT nasal spray Place 1 spray into both nostrils daily as needed for allergies or rhinitis.    [provider]  glucose blood (FREESTYLE TEST STRIPS) test strip CHECK GLUCOSE FOUR TIMES A DAY 10/10/20   Elby Showers, MD  ibuprofen (ADVIL) 100 MG tablet Take 100 mg by mouth every 6 (six) hours as needed for pain or fever.    [provider]  Lancets (FREESTYLE) lancets CHECK GLUCOSE TWICE A DAY 01/26/20   Elby Showers, MD  losartan-hydrochlorothiazide [facility]) 100-25 MG tablet TAKE 1 TABLET BY MOUTH DAILY 09/03/20   Elby Showers, MD  meclizine (ANTIVERT) 25 MG tablet Take 1 tablet (25 mg total) by mouth 2 (two) times daily as needed for dizziness. Patient not taking: Reported on 10/16/2020 07/24/20   Elby Showers, MD  metFORMIN (GLUCOPHAGE) 500 MG tablet TAKE 1 TABLET(500 MG) BY MOUTH TWICE DAILY WITH A MEAL Patient taking differently: Take 500 mg by mouth 2 (two) times daily with a meal.  09/03/20   Baxley, Cresenciano Lick, MD  neomycin-polymyxin-hydrocortisone (CORTISPORIN) OTIC solution Place 4 drops into both ears at bedtime. 06/01/20   Elby Showers, MD    Allergies    Lisinopril  Review of Systems   Review of Systems  Constitutional: Negative for fever.  HENT: Negative for ear pain.   Eyes: Negative for pain.  Respiratory: Negative for cough.   Cardiovascular: Negative for chest pain.  Gastrointestinal: Negative for abdominal pain.  Genitourinary: Negative for flank pain.  Musculoskeletal: Negative for back pain.  Skin: Negative for rash.  Neurological: Negative for headaches.    Physical Exam Updated Vital Signs BP (!) 125/92 (BP  Location: Right Arm)   Pulse 89   Temp 98.3 F (36.8 C) (Oral)   Resp 20   SpO2 100%   Physical Exam Constitutional:      General: She is not in acute distress.    Appearance: Normal appearance.  HENT:     Head: Normocephalic.     Nose: Nose normal.     Comments: Right nostril appears clear.  Left nostril I see moderate amount of blood inside the nostril, appears wet but no active bleeding noted. Eyes:     Extraocular Movements: Extraocular movements intact.  Cardiovascular:     Rate and  Rhythm: Normal rate.  Pulmonary:     Effort: Pulmonary effort is normal.  Abdominal:     Palpations: Abdomen is soft.     Tenderness: There is no abdominal tenderness.  Musculoskeletal:        General: Normal range of motion.     Cervical back: Normal range of motion.  Skin:    General: Skin is warm.  Neurological:     General: No focal deficit present.     Mental Status: She is alert and oriented to person, place, and time. Mental status is at baseline.     Cranial Nerves: No cranial nerve deficit.     Motor: No weakness.     ED Results / Procedures / Treatments   Labs (all labs ordered are listed, but only abnormal results are displayed) Labs Reviewed  CBC WITH DIFFERENTIAL/PLATELET - Abnormal; Notable for the following components:      Result Value   WBC 11.7 (*)    All other components within normal limits  CBG MONITORING, ED - Abnormal; Notable for the following components:   Glucose-Capillary 129 (*)    All other components within normal limits    EKG None  Radiology No results found.  Procedures Procedures (including critical care time)  Medications Ordered in ED Medications  oxymetazoline (AFRIN) 0.05 % nasal spray 1 spray (has no administration in time range)    ED Course  I have reviewed the triage vital signs and the nursing notes.  Pertinent labs & imaging results that were available during my care of the patient were reviewed by me and considered in my  medical decision making (see chart for details).    MDM Rules/Calculators/A&P                          Patient has been holding a gauze pad to her nose since this morning.  On evaluation of her gauze pads, there is no active bleeding noted.  CBC is within normal limits.  ENT is present here in the ER, evaluating patient. Nasal packing placed by ENT here in the ER. Patient advised outpatient follow-up with the ENT clinic. Advised to return for recurrent bleeding fevers or any additional concerns.   Final Clinical Impression(s) / ED Diagnoses Final diagnoses:  Epistaxis    Rx / DC Orders ED Discharge Orders    None       Luna Fuse, MD 11/25/20 1233

## 2020-11-25 NOTE — Consult Note (Signed)
ENT CONSULT:  Reason for Consult: Post-surgical epistaxis  Referring Physician:  Dr. Thamas Jaegers  HPI: Victoria [name] is an 79 y.o. female with history of nasal polyposis who underwent functional endoscopic sinus surgery ad polypectomy with Dr. Redmond Baseman on 11/20/2020. She had initially presented to [facility] emergency department on 11/24/2020 with complaints of bilateral epistaxis.  When she was examined by ED provider, she was noted to not have any active bleeding, with Hgb of 13.5 and was discharged home with instructions to use nasal saline rinses and Afrin as needed.  She reports that at approximately 4 AM this morning, she started to note recurrence of bilateral nasal bleeding, which she states persisted for about 2 hours. She called and notified me, and I instructed her to present to [facility] ED for evaluation. Upon arrival to [facility], she had no evidence of active bleeding.    Past Medical History:  Diagnosis Date  . Abnormal finding on Pap smear   . Allergy    SEASONAL  . Anxiety   . Breast cancer ([place])   . Cataract    BILATERAL REMOVED,HAVE IMPLANTS  . Cholelithiasis 11-2012  . Cirrhosis ([place]) 11-2012  . Diabetes mellitus   . Gallstones   . Hepatitis C 11-2012   Active  . Hypertension   . Obesity   . Pancreatitis, acute 11-2012  . Thrombocytopenia ([place]) 11-2012    Past Surgical History:  Procedure Laterality Date  . BREAST LUMPECTOMY  right  . BREAST LUMPECTOMY    . BREAST REDUCTION SURGERY  2005  . CATARACT EXTRACTION, BILATERAL    . CHOLECYSTECTOMY N/A 03/30/2013   Procedure: LAPAROSCOPIC CHOLECYSTECTOMY WITH INTRAOPERATIVE CHOLANGIOGRAM;  Surgeon: Shann Medal, MD;  Location: [facility] ORS;  Service: General;  Laterality: N/A;  . COLONOSCOPY    . EUS  01/28/2013   Procedure: UPPER ENDOSCOPIC ULTRASOUND (EUS) LINEAR;  Surgeon: Milus Banister, MD;  Location: [facility] ENDOSCOPY;  Service: Endoscopy;  Laterality: N/A;  ride-adrian  [place] [phone]  . EXCISION OF SKIN TAG  Right 03/30/2013   Procedure: EXCISION OF SKIN TAG;  Surgeon: Shann Medal, MD;  Location: [facility] ORS;  Service: General;  Laterality: Right;  . LIVER BIOPSY  03/30/2013   Procedure: LIVER BIOPSY;  Surgeon: Shann Medal, MD;  Location: [facility] ORS;  Service: General;;  . POLYPECTOMY    . THYROIDECTOMY, PARTIAL  1972   left  . vertigo      Family History  Problem Relation Age of Onset  . Diabetes Mother   . Stroke Mother   . Other Sister   . Colon cancer Sister 44  . Colon polyps Neg Hx   . Esophageal cancer Neg Hx   . Rectal cancer Neg Hx   . Stomach cancer Neg Hx   . Pancreatic cancer Neg Hx   . Prostate cancer Neg Hx     Social History:  reports that she has never smoked. She has never used smokeless tobacco. She reports that she does not drink alcohol and does not use drugs.  Allergies:  Allergies  Allergen Reactions  . Lisinopril Cough    Medications: I have reviewed the patient's current medications.  Results for orders placed or performed during the hospital encounter of 11/25/20 (from the past 48 hour(s))  CBG monitoring, ED     Status: Abnormal   Collection Time: 11/25/20 10:35 AM  Result Value Ref Range   Glucose-Capillary 129 (H) 70 - 99 mg/dL    Comment: Glucose reference range  applies only to samples taken after fasting for at least 8 hours.   Comment 1 Notify RN    Comment 2 Document in Chart   CBC with Differential     Status: Abnormal   Collection Time: 11/25/20 11:40 AM  Result Value Ref Range   WBC 11.7 (H) 4.0 - 10.5 K/uL   RBC 5.04 3.87 - 5.11 MIL/uL   Hemoglobin 13.6 12.0 - 15.0 g/dL   HCT 41.8 36 - 46 %   MCV 82.9 80.0 - 100.0 fL   MCH 27.0 26.0 - 34.0 pg   MCHC 32.5 30.0 - 36.0 g/dL   RDW 15.0 11.5 - 15.5 %   Platelets 231 150 - 400 K/uL   nRBC 0.0 0.0 - 0.2 %   Neutrophils Relative % 62 %   Neutro Abs 7.3 1.7 - 7.7 K/uL   Lymphocytes Relative 31 %   Lymphs Abs 3.6 0.7 - 4.0 K/uL   Monocytes Relative 6 %   Monocytes Absolute 0.7 0.1 - 1.0 K/uL    Eosinophils Relative 1 %   Eosinophils Absolute 0.1 0.0 - 0.5 K/uL   Basophils Relative 0 %   Basophils Absolute 0.0 0.0 - 0.1 K/uL   Immature Granulocytes 0 %   Abs Immature Granulocytes 0.04 0.00 - 0.07 K/uL    Comment: Performed at [facility], [address] [place], [place] [address]    No results found.  ROS:ROS  Blood pressure (!) 125/92, pulse 89, temperature 98.3 F (36.8 C), temperature source Oral, resp. rate 20, SpO2 100 %.  PHYSICAL EXAM: CONSTITUTIONAL: well developed, nourished, no distress and alert and oriented x 3 PULMONARY/CHEST WALL: effort normal and no stridor, no stertor, no dysphonia HENT: Head : normocephalic and atraumatic Ears: Right ear:   canal normal, external ear normal and hearing normal Left ear:   canal normal, external ear normal and hearing normal Nose: Septum midline, some bloody mucous in left nare with no active bleeding noted. Right nare with no evidence of recent bleeding. Mouth/Throat:  Mouth: uvula midline and no oral lesions Throat: oropharynx clear and moist, no blood or clot on posterior pharyngeal wall  Mucous membranes: normal EYES: conjunctiva normal, EOM normal and PERRL NECK: supple, trachea normal    Procedure: Nasal endoscopy  Findings: Left sphenoidotomy with streak of clotted blood/mucous, no active bleeding appreciated, no purulence or evidence of infection. Right nasal cavity appears normal, no active bleeding or dried blood noted.   Description: Informed verbal consent was obtained after explaining the risks (including bleeding and infection), benefits and alternatives of the procedure. Verbal timeout was performed prior to the procedure. The nose was topicalized with topical lidocaine/oxymetazoline. The flexible slim tip ambu scope was advanced through the bilateral nasal cavities with findings as above. Naspore dissolvable packing was then placed in the left nare.The patient tolerated the procedure with no  immediate complications.   Assessment/Plan: Victoria [name] is a 79 y/o F s/p functional endoscopic sinus surgery with polypectomy on 11/20/2020 with Dr. Redmond Baseman, presenting with 1 day history of recurrent epistaxis. Patient with no active bleeding while in ED  -Nasal endoscopy performed at bedside, see above for findings -Naspore dissolvable nasal packing placed in left nare -Hgb has remained stable, 13.6 this AM, 13.5 yesterday  -Patient instructed to continue nasal saline rinses, Afrin as needed as per post-op instructions -Emphasized importance of avoiding heavy lifting, and limiting physical activity for additional week -Follow up with Dr. Redmond Baseman as scheduled, patient advised to return to  [facility] ED if severe bleeding should resume  Thank you for allowing me to participate in the care of this patient. Please do not hesitate to contact me with any questions or concerns.   Jason Coop, [facility] Cell: [phone]   11/25/2020, 12:03 PM

## 2020-11-28 ENCOUNTER — Other Ambulatory Visit: Payer: Medicare Other | Admitting: Internal Medicine

## 2020-11-28 ENCOUNTER — Other Ambulatory Visit: Payer: Self-pay

## 2020-11-28 DIAGNOSIS — N6099 Unspecified benign mammary dysplasia of unspecified breast: Secondary | ICD-10-CM | POA: Diagnosis not present

## 2020-11-28 DIAGNOSIS — H903 Sensorineural hearing loss, bilateral: Secondary | ICD-10-CM | POA: Diagnosis not present

## 2020-11-28 DIAGNOSIS — I10 Essential (primary) hypertension: Secondary | ICD-10-CM | POA: Diagnosis not present

## 2020-11-28 DIAGNOSIS — E119 Type 2 diabetes mellitus without complications: Secondary | ICD-10-CM | POA: Diagnosis not present

## 2020-11-28 DIAGNOSIS — Z13220 Encounter for screening for lipoid disorders: Secondary | ICD-10-CM | POA: Diagnosis not present

## 2020-11-28 DIAGNOSIS — Z8619 Personal history of other infectious and parasitic diseases: Secondary | ICD-10-CM

## 2020-11-28 DIAGNOSIS — Z1329 Encounter for screening for other suspected endocrine disorder: Secondary | ICD-10-CM

## 2020-11-28 DIAGNOSIS — Z0000 Encounter for general adult medical examination without abnormal findings: Secondary | ICD-10-CM | POA: Diagnosis not present

## 2020-11-28 DIAGNOSIS — E786 Lipoprotein deficiency: Secondary | ICD-10-CM | POA: Diagnosis not present

## 2020-11-29 LAB — MICROALBUMIN / CREATININE URINE RATIO
Creatinine, Urine: 284 mg/dL — ABNORMAL HIGH (ref 20–275)
Microalb Creat Ratio: 10 mcg/mg creat (ref <30)
Microalb, Ur: 2.8 mg/dL

## 2020-11-29 LAB — COMPLETE METABOLIC PANEL WITH GFR
AG Ratio: 1.4 (calc) (ref 1.0–2.5)
ALT: 13 U/L (ref 6–29)
AST: 14 U/L (ref 10–35)
Albumin: 4.1 g/dL (ref 3.6–5.1)
Alkaline phosphatase (APISO): 65 U/L (ref 37–153)
BUN/Creatinine Ratio: 15 (calc) (ref 6–22)
BUN: 14 mg/dL (ref 7–25)
CO2: 26 mmol/L (ref 20–32)
Calcium: 9.1 mg/dL (ref 8.6–10.4)
Chloride: 100 mmol/L (ref 98–110)
Creat: 0.96 mg/dL — ABNORMAL HIGH (ref 0.60–0.93)
GFR, Est African American: 66 mL/min/{1.73_m2} (ref >=60)
GFR, Est Non African American: 57 mL/min/{1.73_m2} — ABNORMAL LOW (ref >=60)
Globulin: 2.9 g/dL (calc) (ref 1.9–3.7)
Glucose, Bld: 132 mg/dL — ABNORMAL HIGH (ref 65–99)
Potassium: 4.2 mmol/L (ref 3.5–5.3)
Sodium: 140 mmol/L (ref 135–146)
Total Bilirubin: 0.5 mg/dL (ref 0.2–1.2)
Total Protein: 7 g/dL (ref 6.1–8.1)

## 2020-11-29 LAB — CBC WITH DIFFERENTIAL/PLATELET
Absolute Monocytes: 524 cells/uL (ref 200–950)
Basophils Absolute: 28 cells/uL (ref 0–200)
Basophils Relative: 0.3 %
Eosinophils Absolute: 110 cells/uL (ref 15–500)
Eosinophils Relative: 1.2 %
HCT: 39.6 % (ref 35.0–45.0)
Hemoglobin: 13 g/dL (ref 11.7–15.5)
Lymphs Abs: 3192 cells/uL (ref 850–3900)
MCH: 27.1 pg (ref 27.0–33.0)
MCHC: 32.8 g/dL (ref 32.0–36.0)
MCV: 82.5 fL (ref 80.0–100.0)
MPV: 9.7 fL (ref 7.5–12.5)
Monocytes Relative: 5.7 %
Neutro Abs: 5345 cells/uL (ref 1500–7800)
Neutrophils Relative %: 58.1 %
Platelets: 245 10*3/uL (ref 140–400)
RBC: 4.8 10*6/uL (ref 3.80–5.10)
RDW: 13.8 % (ref 11.0–15.0)
Total Lymphocyte: 34.7 %
WBC: 9.2 10*3/uL (ref 3.8–10.8)

## 2020-11-29 LAB — TSH: TSH: 0.81 mIU/L (ref 0.40–4.50)

## 2020-11-29 LAB — LIPID PANEL
Cholesterol: 148 mg/dL (ref <200)
HDL: 44 mg/dL — ABNORMAL LOW (ref >=50)
LDL Cholesterol (Calc): 83 mg/dL (calc)
Non-HDL Cholesterol (Calc): 104 mg/dL (calc) (ref <130)
Total CHOL/HDL Ratio: 3.4 (calc) (ref <5.0)
Triglycerides: 113 mg/dL (ref <150)

## 2020-11-29 LAB — HEMOGLOBIN A1C
Hgb A1c MFr Bld: 6.4 % of total Hgb — ABNORMAL HIGH (ref <5.7)
Mean Plasma Glucose: 137 mg/dL
eAG (mmol/L): 7.6 mmol/L

## 2020-11-30 ENCOUNTER — Encounter: Payer: Self-pay | Admitting: Internal Medicine

## 2020-11-30 ENCOUNTER — Ambulatory Visit (INDEPENDENT_AMBULATORY_CARE_PROVIDER_SITE_OTHER): Payer: Medicare Other | Admitting: Internal Medicine

## 2020-11-30 ENCOUNTER — Other Ambulatory Visit: Payer: Self-pay

## 2020-11-30 VITALS — BP 110/80 | HR 101 | Ht 68.0 in | Wt 217.0 lb

## 2020-11-30 DIAGNOSIS — E786 Lipoprotein deficiency: Secondary | ICD-10-CM | POA: Diagnosis not present

## 2020-11-30 DIAGNOSIS — Z1231 Encounter for screening mammogram for malignant neoplasm of breast: Secondary | ICD-10-CM

## 2020-11-30 DIAGNOSIS — E890 Postprocedural hypothyroidism: Secondary | ICD-10-CM | POA: Diagnosis not present

## 2020-11-30 DIAGNOSIS — Z0000 Encounter for general adult medical examination without abnormal findings: Secondary | ICD-10-CM

## 2020-11-30 DIAGNOSIS — Z8619 Personal history of other infectious and parasitic diseases: Secondary | ICD-10-CM

## 2020-11-30 DIAGNOSIS — E119 Type 2 diabetes mellitus without complications: Secondary | ICD-10-CM

## 2020-11-30 DIAGNOSIS — H8110 Benign paroxysmal vertigo, unspecified ear: Secondary | ICD-10-CM | POA: Diagnosis not present

## 2020-11-30 DIAGNOSIS — Z853 Personal history of malignant neoplasm of breast: Secondary | ICD-10-CM | POA: Diagnosis not present

## 2020-11-30 DIAGNOSIS — I10 Essential (primary) hypertension: Secondary | ICD-10-CM

## 2020-11-30 DIAGNOSIS — F411 Generalized anxiety disorder: Secondary | ICD-10-CM

## 2020-11-30 LAB — POCT URINALYSIS DIPSTICK
Appearance: NEGATIVE
Bilirubin, UA: NEGATIVE
Blood, UA: NEGATIVE
Glucose, UA: NEGATIVE
Ketones, UA: NEGATIVE
Leukocytes, UA: NEGATIVE
Nitrite, UA: NEGATIVE
Odor: NEGATIVE
Protein, UA: NEGATIVE
Spec Grav, UA: 1.02 (ref 1.010–1.025)
Urobilinogen, UA: 0.2 E.U./dL
pH, UA: 6.5 (ref 5.0–8.0)

## 2020-11-30 NOTE — Patient Instructions (Addendum)
It was a pleasure to see you  RTC in 6 months.No change in meds.

## 2020-11-30 NOTE — Progress Notes (Signed)
Subjective:    Patient ID: Victoria [name], female    DOB: [birth year], 79 y.o.   MRN: [id number]  HPI 79 year old Female seen for Medicare wellness, health maintenance exam and evaluation of medical issues.   Had nosebleed s/p nasal surgery by Dr. Redmond Baseman. Required 2 ED visits.  Underwent nasal polypectomy by Dr. Redmond Baseman on November 20, 2020.  History of vertigo and patient interested in Epley maneuver if has recurrent dizziness.  Colonosocpy done March 16, 2019 which was clear with Dr. Silverio Decamp  Who is recommending no repeat due to age.  Laparoscopic cholecystectomy 15-Mar-2013.  Presented with pancreatitis in December 2013.  History of bilateral cataract extractions February 2014  History of essential hypertension and diabetes mellitus.  Remote history of Hepatitis C treated with Harvoni.  Treatment was completed in [name] 2015.  Remote history of breast cancer.  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  There was a reexcision in September 2000 showing a single microscopic focus of intraductal carcinoma.  She had radiation therapy at that time.  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  10 lymph nodes were negative for tumor.  Tumor was ER and PR positive.  She was referred to radiation oncology for further radiation of the remaining right breast tissue.  She has been on estrogen replacement since 1996-03-15 at the time of her diagnosis.  No family history of breast cancer.  Tamoxifen was recommended but she declined to take it.  Dr. Gershon Crane does annual eye exams.  History of headaches which may be migraine type headaches.  Takes sparingly hydrocodone APAP.  History of diabetes mellitus treated with oral medications.  Took estrogen replacement for menopausal starting around Mar 15, 1996.  Family history:  No family history of breast cancer.  Sister died of colon cancer around 2011/03/16.  She has 3 children.  History of partial left thyroidectomy at [facility] in 03-16-71.  TSH has always been normal.  History of abnormal Pap smear in March 15, 2004.  Pap in Mar 15, 2014 was normal except for atrophy.  Electronic medical record indicates she is scheduled for vaginal hysterectomy by Dr. Perlie Gold in 03/15/2021.  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  Does not smoke or consume alcohol.  Review of Systems see above     Objective:   Physical Exam  Blood pressure 110/80 pulse 101 regular pulse oximetry 95% weight 217 pounds BMI 32.99  Skin is warm and dry.  No thyromegaly.  No carotid bruits.  TMs clear.  Neck is supple.  Chest clear to auscultation.  Breast without masses.  Cardiac exam regular rate and rhythm normal S1 and S2 without murmurs or gallops.  Abdomen is soft: Nondistended without hepatosplenomegaly masses or tenderness.  No lower extremity pitting edema.  Neuro intact without focal deficits.      Assessment & Plan:  Status post recent sinus surgery by Dr. Redmond Baseman for chronic sinusitis issues and inflammation.  Is recovering well.  History of vertigo-may need Epley maneuver if recurrence.  History of gallstone pancreatitis April 2014 status post cholecystectomy by Dr. Lucia Gaskins.  Liver biopsy revealed cirrhosis and hepatitis C was discovered.  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  Reexcision in September 2005 showed  single focus of intraductal carcinoma treated with radiation.  Breast reduction surgery January 2005 showed 1 cm mucinous carcinoma with multiple foci of atypical ductal hyperplasia with 2 lymph nodes negative for tumor.  Tumor was ER and PR positive.  Was referred for further radiation treatment of right breast.  At the time had been on estrogen replacement since 1997.   Tamoxifen was recommended but she declined to take it.  History of left partial thyroidectomy in 1972 in [place].  TSH is always been normal and she has not been on thyroid replacement.  History of diabetes mellitus treated with oral medications.  Hemoglobin A1c stable at 6.4%.  It was 6.6% in June and 7% in December 2020.  Takes Metformin.  History of anxiety treated with Xanax.  History of hypertension treated with losartan HCTZ 100/25 daily.  And amlodipine 2.5 mg daily.  History of benign vertigo treated with meclizine.  Plan: Patient seen here every 6 months and will follow up in June.  With recent labs as low HDL of 44 which has been noted previously in December 2020 at which time it was 17.  Continue diet and exercise efforts.  Subjective:   Patient presents for Medicare Annual/Subsequent preventive examination.  Review Past Medical/Family/Social: See above   Risk Factors  Current exercise habits: Light Dietary issues discussed: Low-fat low carbohydrate discussed  Cardiac risk factors: Diabetes mellitus-currently not on statin.  Has low HDL of 44.  Has not wanted to be on statin.  Lipids have been normal with the exception of low HDL.  Depression Screen  (Note: if answer to either of the following is "Yes", a more complete depression screening is indicated)   Over the past two weeks, have you felt down, depressed or hopeless? No  Over the past two weeks, have you felt little interest or pleasure in doing things? No Have you lost interest or pleasure in daily life? No Do you often feel hopeless? No Do you cry easily over simple problems? No   Activities of Daily Living  In your present state of health, do you have any difficulty performing the following activities?:   Driving? No  Managing money? No  Feeding yourself? No  Getting from bed to chair? No  Climbing a flight of stairs? No  Preparing food and eating?: No  Bathing or showering? No  Getting dressed: No   Getting to the toilet? No  Using the toilet:No  Moving around from place to place: No  In the past year have you fallen or had a near fall?:No  Are you sexually active? No  Do you have more than one partner?  Not a concern  Hearing Difficulties: No  Do you often ask people to speak up or repeat themselves? No  Do you experience ringing or noises in your ears? No  Do you have difficulty understanding soft or whispered voices? No  Do you feel that you have a problem with memory? No Do you often misplace items? No    Home Safety:  Do you have a smoke alarm at your residence? Yes Do you have grab bars in the bathroom?  No Do you have throw rugs in your house?  No   Cognitive Testing  Alert? Yes Normal Appearance?Yes  Oriented to person? Yes Place? Yes  Time? Yes  Recall of three objects? Yes  Can perform simple calculations? Yes  Displays appropriate judgment?Yes  Can read the correct time from a watch face?Yes   List the Names  of Other Physician/Practitioners you currently use:  See referral list for the physicians patient is currently seeing.     Review of Systems: See above   Objective:     General appearance: Appears younger than stated age  Head: Normocephalic, without obvious abnormality, atraumatic  Eyes: conj clear, EOMi PEERLA  Ears: normal TM's and external ear canals both ears  Nose: Nares normal. Septum midline. Mucosa normal. No drainage or sinus tenderness.  Throat: lips, mucosa, and tongue normal; teeth and gums normal  Neck: no adenopathy, no carotid bruit, no JVD, supple, symmetrical, trachea midline and thyroid not enlarged, symmetric, no tenderness/mass/nodules  No CVA tenderness.  Lungs: clear to auscultation bilaterally  Breasts: normal appearance, no masses or tenderness Heart: regular rate and rhythm, S1, S2 normal, no murmur, click, rub or gallop  Abdomen: soft, non-tender; bowel sounds normal; no masses, no organomegaly  Musculoskeletal: ROM  normal in all joints, no crepitus, no deformity, Normal muscle strengthen. Back  is symmetric, no curvature. Skin: Skin color, texture, turgor normal. No rashes or lesions  Lymph nodes: Cervical, supraclavicular, and axillary nodes normal.  Neurologic: CN 2 -12 Normal, Normal symmetric reflexes. Normal coordination and gait  Psych: Alert & Oriented x 3, Mood appear stable.    Assessment:    Annual wellness medicare exam   Plan:    During the course of the visit the patient was educated and counseled about appropriate screening and preventive services including:   Has had Covid vaccines.  Has had both Prevnar 13 and pneumococcal 23 vaccines.  Has annual flu vaccine.  Had colonoscopy in 2020.  Repeat study recommended in 3 years by Dr. Silverio Decamp.  Needs annual mammography.  This was ordered.  There is no recent study in Epic.  Last mammogram on file in Epic was 2017.   Patient Instructions (the written plan) was given to the patient.  Medicare Attestation  I have personally reviewed:  The patient's medical and social history  Their use of alcohol, tobacco or illicit drugs  Their current medications and supplements  The patient's functional ability including ADLs,fall risks, home safety risks, cognitive, and hearing and visual impairment  Diet and physical activities  Evidence for depression or mood disorders  The patient's weight, height, BMI, and visual acuity have been recorded in the chart. I have made referrals, counseling, and provided education to the patient based on review of the above and I have provided the patient with a written personalized care plan for preventive services.

## 2020-12-02 ENCOUNTER — Other Ambulatory Visit: Payer: Self-pay | Admitting: Internal Medicine

## 2020-12-04 DIAGNOSIS — J343 Hypertrophy of nasal turbinates: Secondary | ICD-10-CM | POA: Diagnosis not present

## 2020-12-04 DIAGNOSIS — Z9889 Other specified postprocedural states: Secondary | ICD-10-CM | POA: Diagnosis not present

## 2020-12-04 DIAGNOSIS — Z48813 Encounter for surgical aftercare following surgery on the respiratory system: Secondary | ICD-10-CM | POA: Diagnosis not present

## 2020-12-21 ENCOUNTER — Telehealth: Payer: Self-pay

## 2021-01-02 DIAGNOSIS — N8189 Other female genital prolapse: Secondary | ICD-10-CM | POA: Diagnosis not present

## 2021-01-05 ENCOUNTER — Other Ambulatory Visit: Payer: Self-pay

## 2021-01-05 MED ORDER — ALPRAZOLAM 0.5 MG PO TABS
ORAL_TABLET | ORAL | 5 refills | Status: DC
Start: 2021-01-05 — End: 2021-05-03

## 2021-01-05 MED ORDER — METFORMIN HCL 500 MG PO TABS
ORAL_TABLET | ORAL | 2 refills | Status: DC
Start: 2021-01-05 — End: 2022-02-27

## 2021-01-05 MED ORDER — AMLODIPINE BESYLATE 2.5 MG PO TABS
ORAL_TABLET | ORAL | 1 refills | Status: DC
Start: 2021-01-05 — End: 2022-02-04

## 2021-01-05 NOTE — Addendum Note (Signed)
Addended by: Mady Haagensen on: 01/05/2021 05:14 PM   Modules accepted: Orders

## 2021-01-13 NOTE — Telephone Encounter (Signed)
err

## 2021-01-31 ENCOUNTER — Telehealth: Payer: Self-pay | Admitting: Internal Medicine

## 2021-01-31 MED ORDER — LOSARTAN POTASSIUM-HCTZ 100-25 MG PO TABS
1.0000 | ORAL_TABLET | Freq: Every day | ORAL | 3 refills | Status: DC
Start: 2021-01-31 — End: 2021-11-06

## 2021-01-31 NOTE — Telephone Encounter (Signed)
Sent!

## 2021-01-31 NOTE — Telephone Encounter (Signed)
Victoria [name]  [phone]  [name] called and now has a new pharmacy because of new insurance, she needs refill for below medication sent to [facility] mail in.  losartan-hydrochlorothiazide [facility]) 100-25 MG tablet   [place], [place] Phone:  [phone]  Fax:  [phone]

## 2021-01-31 NOTE — Telephone Encounter (Signed)
Please refill x one year 

## 2021-02-20 ENCOUNTER — Other Ambulatory Visit: Payer: Self-pay

## 2021-02-20 ENCOUNTER — Encounter (HOSPITAL_BASED_OUTPATIENT_CLINIC_OR_DEPARTMENT_OTHER): Payer: Self-pay | Admitting: Obstetrics and Gynecology

## 2021-02-20 NOTE — Progress Notes (Signed)
YOU ARE SCHEDULED FOR A COVID TEST 02-26-2021 @ 1130 AM. THIS TEST MUST BE DONE BEFORE SURGERY. GO TO  [place]. [place], [place], IT IS APPROXIMATELY 2 MINUTES PAST [employer] ON THE RIGHT AND REMAIN IN YOUR CAR, THIS IS A DRIVE UP TEST. ONCE YOUR COVID TEST IS DONE PLEASE FOLLOW ALL THE QUARANTINE  INSTRUCTIONS GIVEN IN YOUR HANDOUT.      Your procedure is scheduled on 02-28-2021  Report to [facility]   Call this number if you have problems the morning of surgery  :[phone].   OUR ADDRESS IS [address].  WE ARE LOCATED IN [facility].  PLEASE BRING YOUR INSURANCE CARD AND PHOTO ID DAY OF SURGERY.  ONLY ONE PERSON ALLOWED IN FACILITY WAITING AREA.                                     REMEMBER:  DO NOT EAT FOOD, CANDY GUM OR MINTS  AFTER MIDNIGHT . YOU MAY HAVE CLEAR LIQUIDS FROM MIDNIGHT UNTIL 530 AM. NO CLEAR LIQUIDS AFTER  530 AM DAY OF SURGERY.   YOU MAY  BRUSH YOUR TEETH MORNING OF SURGERY AND RINSE YOUR MOUTH OUT, NO CHEWING GUM CANDY OR MINTS.    CLEAR LIQUID DIET   Foods Allowed                                                                     Foods Excluded  Coffee and tea, regular and decaf                             liquids that you cannot  Plain Jell-O any favor except red or purple                                           see through such as: Fruit ices (not with fruit pulp)                                     milk, soups, orange juice  Iced Popsicles                                    All solid food Carbonated beverages, regular and diet                                    Cranberry, grape and apple juices Sports drinks like Gatorade Lightly seasoned clear broth or consume(fat free) Sugar, honey syrup  Sample Menu Breakfast                                Lunch  Supper Cranberry juice                    Beef broth                            Chicken  broth Jell-O                                     Grape juice                           Apple juice Coffee or tea                        Jell-O                                      Popsicle                                                Coffee or tea                        Coffee or tea  _____________________________________________________________________     TAKE THESE MEDICATIONS MORNING OF SURGERY WITH A SIP OF WATER:   XANAX IF NEEDED.   ONE VISITOR IS ALLOWED IN WAITING ROOM ONLY DAY OF SURGERY.  NO VISITOR MAY SPEND THE NIGHT.  VISITOR ARE ALLOWED TO STAY UNTIL 800 PM.                                    DO NOT WEAR JEWERLY, MAKE UP, OR NAIL POLISH ON FINGERNAILS. DO NOT WEAR LOTIONS, POWDERS, PERFUMES OR DEODORANT. DO NOT SHAVE FOR 24 HOURS PRIOR TO DAY OF SURGERY. MEN MAY SHAVE FACE AND NECK. CONTACTS, GLASSES, OR DENTURES MAY NOT BE WORN TO SURGERY.                                    [facility] IS NOT RESPONSIBLE  FOR ANY BELONGINGS.                                                                    Marland Kitchen           [facility] - Preparing for Surgery Before surgery, you can play an important role.  Because skin is not sterile, your skin needs to be as free of germs as possible.  You can reduce the number of germs on your skin by washing with CHG (chlorahexidine gluconate) soap before surgery.  CHG is an antiseptic cleaner which kills germs and bonds with the skin to continue killing germs even after washing. Please DO NOT use if you have an allergy to CHG or  antibacterial soaps.  If your skin becomes reddened/irritated stop using the CHG and inform your nurse when you arrive at Short Stay. Do not shave (including legs and underarms) for at least 48 hours prior to the first CHG shower.  You may shave your face/neck. Please follow these instructions carefully:  1.  Shower with CHG Soap the night before surgery and the  morning of Surgery.  2.  If you choose to wash your hair, wash your  hair first as usual with your  normal  shampoo.  3.  After you shampoo, rinse your hair and body thoroughly to remove the  shampoo.                            4.  Use CHG as you would any other liquid soap.  You can apply chg directly  to the skin and wash                      Gently with a scrungie or clean washcloth.  5.  Apply the CHG Soap to your body ONLY FROM THE NECK DOWN.   Do not use on face/ open                           Wound or open sores. Avoid contact with eyes, ears mouth and genitals (private parts).                       Wash face,  Genitals (private parts) with your normal soap.             6.  Wash thoroughly, paying special attention to the area where your surgery  will be performed.  7.  Thoroughly rinse your body with warm water from the neck down.  8.  DO NOT shower/wash with your normal soap after using and rinsing off  the CHG Soap.                9.  Pat yourself dry with a clean towel.            10.  Wear clean pajamas.            11.  Place clean sheets on your bed the night of your first shower and do not  sleep with pets. Day of Surgery : Do not apply any lotions/deodorants the morning of surgery.  Please wear clean clothes to the hospital/surgery center.  FAILURE TO FOLLOW THESE INSTRUCTIONS MAY RESULT IN THE CANCELLATION OF YOUR SURGERY PATIENT SIGNATURE_________________________________  NURSE SIGNATURE__________________________________  ________________________________________________________________________                                                        QUESTIONS Hansel Feinstein PRE OP NURSE PHONE [phone]

## 2021-02-20 NOTE — Progress Notes (Signed)
Spoke w/ via phone for pre-op interview---pt Lab needs dos----has lab appt 02-26-2021 for cbc cmp t & s               Lab results------ekg 11-24-2020 epic COVID test ------02-26-2021 1130 Arrive at -------630 am 3-9-2022then npo NPO after MN NO Solid Food.  Clear liquids from MN until---530 am then npo Medications to take morning of surgery -----xanax prn Diabetic medication -----none day of surgery Patient Special Instructions -----none Pre-Op special Istructions -----none Patient verbalized understanding of instructions that were given at this phone interview. Patient denies shortness of breath, chest pain, fever, cough at this phone interview.

## 2021-02-26 ENCOUNTER — Encounter (HOSPITAL_COMMUNITY)
Admission: RE | Admit: 2021-02-26 | Discharge: 2021-02-26 | Disposition: A | Discharge status: Home / Self Care | Payer: Medicare HMO | Source: Ambulatory Visit | Attending: Obstetrics and Gynecology | Admitting: Obstetrics and Gynecology

## 2021-02-26 ENCOUNTER — Other Ambulatory Visit: Payer: Self-pay

## 2021-02-26 ENCOUNTER — Other Ambulatory Visit (HOSPITAL_COMMUNITY)
Admission: RE | Admit: 2021-02-26 | Discharge: 2021-02-26 | Disposition: A | Discharge status: Home / Self Care | Payer: Medicare HMO | Source: Ambulatory Visit | Attending: Obstetrics and Gynecology | Admitting: Obstetrics and Gynecology

## 2021-02-26 DIAGNOSIS — Z20822 Contact with and (suspected) exposure to covid-19: Secondary | ICD-10-CM | POA: Insufficient documentation

## 2021-02-26 DIAGNOSIS — Z01812 Encounter for preprocedural laboratory examination: Secondary | ICD-10-CM | POA: Insufficient documentation

## 2021-02-26 LAB — COMPREHENSIVE METABOLIC PANEL
ALT: 13 U/L (ref 0–44)
AST: 19 U/L (ref 15–41)
Albumin: 4.3 g/dL (ref 3.5–5.0)
Alkaline Phosphatase: 62 U/L (ref 38–126)
Anion gap: 17 — ABNORMAL HIGH (ref 5–15)
BUN: 20 mg/dL (ref 8–23)
CO2: 22 mmol/L (ref 22–32)
Calcium: 9.5 mg/dL (ref 8.9–10.3)
Chloride: 102 mmol/L (ref 98–111)
Creatinine, Ser: 1.09 mg/dL — ABNORMAL HIGH (ref 0.44–1.00)
GFR, Estimated: 52 mL/min — ABNORMAL LOW (ref >60)
Glucose, Bld: 135 mg/dL — ABNORMAL HIGH (ref 70–99)
Potassium: 4.3 mmol/L (ref 3.5–5.1)
Sodium: 141 mmol/L (ref 135–145)
Total Bilirubin: 0.9 mg/dL (ref 0.3–1.2)
Total Protein: 8 g/dL (ref 6.5–8.1)

## 2021-02-26 LAB — CBC
HCT: 40.5 % (ref 36.0–46.0)
Hemoglobin: 13.4 g/dL (ref 12.0–15.0)
MCH: 27.1 pg (ref 26.0–34.0)
MCHC: 33.1 g/dL (ref 30.0–36.0)
MCV: 82 fL (ref 80.0–100.0)
Platelets: 291 10*3/uL (ref 150–400)
RBC: 4.94 MIL/uL (ref 3.87–5.11)
RDW: 15 % (ref 11.5–15.5)
WBC: 8.9 10*3/uL (ref 4.0–10.5)
nRBC: 0 % (ref 0.0–0.2)

## 2021-02-26 LAB — SARS CORONAVIRUS 2 (TAT 6-24 HRS): SARS Coronavirus 2: NEGATIVE

## 2021-02-27 NOTE — H&P (Signed)
Victoria [name] is an 80 y.o. female. She is having increasing discomfort from pelvic relaxation and desires surgical treatment.  Pertinent Gynecological History: Last pap: normal OB History: G5, P3023 SVD x 3   Menstrual History: No LMP recorded. Patient is postmenopausal.    Past Medical History:  Diagnosis Date  . Abnormal finding on Pap smear   . Allergy    SEASONAL  . Anxiety   . Breast cancer ([place]) 2000   right breast lumptectomy, radiation done  . Cirrhosis ([place]) 11/2012   resolved per pt  . Diabetes mellitus type 2, controlled ([place])   . Hepatitis C    took tx for   . Hypertension   . Obesity   . Thrombocytopenia ([place]) 11-2012    Past Surgical History:  Procedure Laterality Date  . BREAST LUMPECTOMY  right  . BREAST LUMPECTOMY    . BREAST REDUCTION SURGERY  2005  . CATARACT EXTRACTION, BILATERAL  2014  . CHOLECYSTECTOMY N/A 03/30/2013   Procedure: LAPAROSCOPIC CHOLECYSTECTOMY WITH INTRAOPERATIVE CHOLANGIOGRAM;  Surgeon: Shann Medal, MD;  Location: [facility];  Service: General;  Laterality: N/A;  . COLONOSCOPY  2021  . EUS  01/28/2013   Procedure: UPPER ENDOSCOPIC ULTRASOUND (EUS) LINEAR;  Surgeon: Milus Banister, MD;  Location: [facility];  Service: Endoscopy;  Laterality: N/A;  ride-adrian  [place] [phone]  . EXCISION OF SKIN TAG Right 03/30/2013   Procedure: EXCISION OF SKIN TAG;  Surgeon: Shann Medal, MD;  Location: [facility];  Service: General;  Laterality: Right;  . LIVER BIOPSY  03/30/2013   Procedure: LIVER BIOPSY;  Surgeon: Shann Medal, MD;  Location: [facility];  Service: General;;  . POLYPECTOMY    . THYROIDECTOMY, PARTIAL  1972   left    Family History  Problem Relation Age of Onset  . Diabetes Mother   . Stroke Mother   . Other Sister   . Colon cancer Sister 44  . Colon polyps Neg Hx   . Esophageal cancer Neg Hx   . Rectal cancer Neg Hx   . Stomach cancer Neg Hx   . Pancreatic cancer Neg Hx   . Prostate cancer Neg Hx     Social  History:  reports that she has never smoked. She has never used smokeless tobacco. She reports that she does not drink alcohol and does not use drugs.  Allergies:  Allergies  Allergen Reactions  . Lisinopril Cough    No medications prior to admission.    Review of Systems  Respiratory: Negative.   Cardiovascular: Negative.     Height 5\' 8"  (1.727 m), weight 99.3 kg. Physical Exam Constitutional:      Appearance: Normal appearance.  Cardiovascular:     Rate and Rhythm: Normal rate and regular rhythm.     Heart sounds: Normal heart sounds. No murmur heard.   Pulmonary:     Effort: Pulmonary effort is normal. No respiratory distress.     Breath sounds: Normal breath sounds. No wheezing.  Abdominal:     General: There is no distension.     Palpations: Abdomen is soft. There is no mass.     Tenderness: There is no abdominal tenderness.  Genitourinary:    Comments: Uterus normal size, cervix comes to introitus with valsalva Gr I cystocele and rectocele No adnexal mass Musculoskeletal:     Cervical back: Normal range of motion and neck supple.  Neurological:     Mental Status: She is alert.     No  results found for this or any previous visit (from the past 24 hour(s)).  No results found.  Assessment/Plan: Symptomatic pelvic relaxation.  Medical and surgical options have been discussed, she wants surgical treatment.  Surgical options, procedure, risks, alternatives, chances of relieving her symptoms have all been discussed, questions answered.  Will admit for [facility], A&P repair, cystoscopy  Blane Ohara Rochelle Larue 02/27/2021, 9:06 PM

## 2021-02-28 ENCOUNTER — Ambulatory Visit (HOSPITAL_COMMUNITY): Payer: Medicare HMO

## 2021-02-28 ENCOUNTER — Encounter (HOSPITAL_BASED_OUTPATIENT_CLINIC_OR_DEPARTMENT_OTHER): Payer: Self-pay | Admitting: Obstetrics and Gynecology

## 2021-02-28 ENCOUNTER — Encounter (HOSPITAL_BASED_OUTPATIENT_CLINIC_OR_DEPARTMENT_OTHER)
Admission: RE | Disposition: A | Discharge status: Home / Self Care | Payer: Self-pay | Source: Ambulatory Visit | Attending: Obstetrics and Gynecology

## 2021-02-28 ENCOUNTER — Ambulatory Visit (HOSPITAL_BASED_OUTPATIENT_CLINIC_OR_DEPARTMENT_OTHER): Payer: Medicare HMO | Admitting: Anesthesiology

## 2021-02-28 ENCOUNTER — Ambulatory Visit (HOSPITAL_BASED_OUTPATIENT_CLINIC_OR_DEPARTMENT_OTHER)
Admission: RE | Admit: 2021-02-28 | Discharge: 2021-03-01 | Disposition: A | Discharge status: Home / Self Care | Payer: Medicare HMO | Source: Ambulatory Visit | Attending: Obstetrics and Gynecology | Admitting: Obstetrics and Gynecology

## 2021-02-28 DIAGNOSIS — Z90710 Acquired absence of both cervix and uterus: Secondary | ICD-10-CM | POA: Diagnosis present

## 2021-02-28 DIAGNOSIS — N800 Endometriosis of uterus: Secondary | ICD-10-CM | POA: Diagnosis not present

## 2021-02-28 DIAGNOSIS — I10 Essential (primary) hypertension: Secondary | ICD-10-CM | POA: Diagnosis not present

## 2021-02-28 DIAGNOSIS — Z408 Encounter for other prophylactic surgery: Secondary | ICD-10-CM | POA: Diagnosis not present

## 2021-02-28 DIAGNOSIS — N736 Female pelvic peritoneal adhesions (postinfective): Secondary | ICD-10-CM | POA: Diagnosis not present

## 2021-02-28 DIAGNOSIS — D251 Intramural leiomyoma of uterus: Secondary | ICD-10-CM | POA: Diagnosis not present

## 2021-02-28 DIAGNOSIS — N72 Inflammatory disease of cervix uteri: Secondary | ICD-10-CM | POA: Insufficient documentation

## 2021-02-28 DIAGNOSIS — N8184 Pelvic muscle wasting: Secondary | ICD-10-CM | POA: Diagnosis not present

## 2021-02-28 DIAGNOSIS — N816 Rectocele: Secondary | ICD-10-CM | POA: Diagnosis not present

## 2021-02-28 DIAGNOSIS — Z888 Allergy status to other drugs, medicaments and biological substances status: Secondary | ICD-10-CM | POA: Insufficient documentation

## 2021-02-28 DIAGNOSIS — D696 Thrombocytopenia, unspecified: Secondary | ICD-10-CM | POA: Diagnosis not present

## 2021-02-28 DIAGNOSIS — E119 Type 2 diabetes mellitus without complications: Secondary | ICD-10-CM | POA: Diagnosis not present

## 2021-02-28 DIAGNOSIS — N8189 Other female genital prolapse: Secondary | ICD-10-CM | POA: Insufficient documentation

## 2021-02-28 HISTORY — PX: ANTERIOR AND POSTERIOR REPAIR: SHX5121

## 2021-02-28 HISTORY — PX: CYSTOSCOPY W/ RETROGRADES: SHX1426

## 2021-02-28 HISTORY — PX: VAGINAL HYSTERECTOMY: SHX2639

## 2021-02-28 HISTORY — PX: CYSTOSCOPY: SHX5120

## 2021-02-28 HISTORY — DX: Type 2 diabetes mellitus without complications: E11.9

## 2021-02-28 LAB — ABO/RH: ABO/RH(D): O POS

## 2021-02-28 LAB — GLUCOSE, CAPILLARY
Glucose-Capillary: 111 mg/dL — ABNORMAL HIGH (ref 70–99)
Glucose-Capillary: 141 mg/dL — ABNORMAL HIGH (ref 70–99)
Glucose-Capillary: 172 mg/dL — ABNORMAL HIGH (ref 70–99)
Glucose-Capillary: 218 mg/dL — ABNORMAL HIGH (ref 70–99)
Glucose-Capillary: 218 mg/dL — ABNORMAL HIGH (ref 70–99)

## 2021-02-28 LAB — TYPE AND SCREEN
ABO/RH(D): O POS
Antibody Screen: NEGATIVE

## 2021-02-28 SURGERY — HYSTERECTOMY, VAGINAL
Anesthesia: General | Site: Vagina

## 2021-02-28 MED ORDER — IBUPROFEN 800 MG PO TABS: 800.0000 mg | ORAL_TABLET | Freq: Four times a day (QID) | ORAL | Status: DC

## 2021-02-28 MED ORDER — ALUM & MAG HYDROXIDE-SIMETH 200-200-20 MG/5ML PO SUSP: 30.0000 mL | ORAL | Status: DC | PRN

## 2021-02-28 MED ORDER — METFORMIN HCL 500 MG PO TABS
500.0000 mg | ORAL_TABLET | Freq: Two times a day (BID) | ORAL | Status: DC
  Filled 2021-02-28: qty 1

## 2021-02-28 MED ORDER — HYDROMORPHONE HCL 1 MG/ML IJ SOLN: 1.0000 mg | INTRAMUSCULAR | Status: DC | PRN

## 2021-02-28 MED ORDER — INSULIN ASPART 100 UNIT/ML ~~LOC~~ SOLN
0.0000 [IU] | SUBCUTANEOUS | Status: DC
  Administered 2021-02-28: 5 [IU] via SUBCUTANEOUS
  Administered 2021-02-28: 3 [IU] via SUBCUTANEOUS
  Administered 2021-02-28: 5 [IU] via SUBCUTANEOUS
  Administered 2021-03-01 (×3): 2 [IU] via SUBCUTANEOUS

## 2021-02-28 MED ORDER — PROPOFOL 10 MG/ML IV BOLUS
INTRAVENOUS | Status: DC | PRN
  Administered 2021-02-28: 160 mg via INTRAVENOUS

## 2021-02-28 MED ORDER — BUPIVACAINE HCL (PF) 0.5 % IJ SOLN
INTRAMUSCULAR | Status: DC | PRN
  Administered 2021-02-28: 30 mL

## 2021-02-28 MED ORDER — ACETAMINOPHEN 500 MG PO TABS
ORAL_TABLET | ORAL | Status: AC
  Filled 2021-02-28: qty 2

## 2021-02-28 MED ORDER — VASOPRESSIN 20 UNIT/ML IV SOLN
INTRAVENOUS | Status: DC | PRN
  Administered 2021-02-28: 51 mL via INTRAMUSCULAR

## 2021-02-28 MED ORDER — ALPRAZOLAM 0.5 MG PO TABS
0.5000 mg | ORAL_TABLET | Freq: Two times a day (BID) | ORAL | Status: DC | PRN
  Administered 2021-02-28: 0.5 mg via ORAL

## 2021-02-28 MED ORDER — INSULIN ASPART 100 UNIT/ML ~~LOC~~ SOLN
SUBCUTANEOUS | Status: AC
  Filled 2021-02-28: qty 1

## 2021-02-28 MED ORDER — IOHEXOL 300 MG/ML  SOLN
INTRAMUSCULAR | Status: DC | PRN
  Administered 2021-02-28: 5 mL via URETHRAL

## 2021-02-28 MED ORDER — DEXAMETHASONE SODIUM PHOSPHATE 4 MG/ML IJ SOLN
INTRAMUSCULAR | Status: DC | PRN
  Administered 2021-02-28: 5 mg via INTRAVENOUS

## 2021-02-28 MED ORDER — OXYCODONE HCL 5 MG PO TABS
5.0000 mg | ORAL_TABLET | ORAL | Status: DC | PRN
  Administered 2021-03-01 (×2): 5 mg via ORAL

## 2021-02-28 MED ORDER — ONDANSETRON HCL 4 MG/2ML IJ SOLN: 4.0000 mg | Freq: Four times a day (QID) | INTRAMUSCULAR | Status: DC | PRN

## 2021-02-28 MED ORDER — LIDOCAINE 2% (20 MG/ML) 5 ML SYRINGE
INTRAMUSCULAR | Status: AC
  Filled 2021-02-28: qty 5

## 2021-02-28 MED ORDER — DOCUSATE SODIUM 100 MG PO CAPS
ORAL_CAPSULE | ORAL | Status: AC
  Filled 2021-02-28: qty 1

## 2021-02-28 MED ORDER — HYDROCHLOROTHIAZIDE 25 MG PO TABS
25.0000 mg | ORAL_TABLET | Freq: Every day | ORAL | Status: DC
  Administered 2021-02-28: 25 mg via ORAL
  Filled 2021-02-28: qty 1

## 2021-02-28 MED ORDER — KETOROLAC TROMETHAMINE 30 MG/ML IJ SOLN
30.0000 mg | Freq: Four times a day (QID) | INTRAMUSCULAR | Status: DC
  Administered 2021-02-28: 30 mg via INTRAVENOUS

## 2021-02-28 MED ORDER — SUGAMMADEX SODIUM 200 MG/2ML IV SOLN
INTRAVENOUS | Status: DC | PRN
  Administered 2021-02-28: 200 mg via INTRAVENOUS

## 2021-02-28 MED ORDER — DEXTROSE-NACL 5-0.45 % IV SOLN: INTRAVENOUS | Status: DC

## 2021-02-28 MED ORDER — AMISULPRIDE (ANTIEMETIC) 5 MG/2ML IV SOLN: 10.0000 mg | Freq: Once | INTRAVENOUS | Status: DC | PRN

## 2021-02-28 MED ORDER — KETOROLAC TROMETHAMINE 30 MG/ML IJ SOLN
INTRAMUSCULAR | Status: DC | PRN
  Administered 2021-02-28: 30 mg via INTRAVENOUS

## 2021-02-28 MED ORDER — FENTANYL CITRATE (PF) 100 MCG/2ML IJ SOLN
25.0000 ug | INTRAMUSCULAR | Status: DC | PRN
  Administered 2021-02-28 (×3): 25 ug via INTRAVENOUS

## 2021-02-28 MED ORDER — GABAPENTIN 300 MG PO CAPS
300.0000 mg | ORAL_CAPSULE | Freq: Three times a day (TID) | ORAL | Status: DC
  Administered 2021-02-28 – 2021-03-01 (×2): 300 mg via ORAL

## 2021-02-28 MED ORDER — FENTANYL CITRATE (PF) 100 MCG/2ML IJ SOLN
INTRAMUSCULAR | Status: AC
  Filled 2021-02-28: qty 2

## 2021-02-28 MED ORDER — ACETAMINOPHEN 500 MG PO TABS
1000.0000 mg | ORAL_TABLET | Freq: Once | ORAL | Status: AC
  Administered 2021-02-28: 1000 mg via ORAL

## 2021-02-28 MED ORDER — MENTHOL 3 MG MT LOZG: 1.0000 | LOZENGE | OROMUCOSAL | Status: DC | PRN

## 2021-02-28 MED ORDER — ONDANSETRON HCL 4 MG/2ML IJ SOLN
INTRAMUSCULAR | Status: AC
  Filled 2021-02-28: qty 2

## 2021-02-28 MED ORDER — CEFAZOLIN SODIUM-DEXTROSE 2-4 GM/100ML-% IV SOLN
2.0000 g | INTRAVENOUS | Status: AC
  Administered 2021-02-28: 2 g via INTRAVENOUS

## 2021-02-28 MED ORDER — GABAPENTIN 300 MG PO CAPS
ORAL_CAPSULE | ORAL | Status: AC
  Filled 2021-02-28: qty 1

## 2021-02-28 MED ORDER — PROPOFOL 10 MG/ML IV BOLUS
INTRAVENOUS | Status: AC
  Filled 2021-02-28: qty 40

## 2021-02-28 MED ORDER — EPHEDRINE SULFATE 50 MG/ML IJ SOLN
INTRAMUSCULAR | Status: DC | PRN
  Administered 2021-02-28: 20 mg via INTRAVENOUS
  Administered 2021-02-28 (×2): 10 mg via INTRAVENOUS

## 2021-02-28 MED ORDER — ONDANSETRON HCL 4 MG PO TABS: 4.0000 mg | ORAL_TABLET | Freq: Four times a day (QID) | ORAL | Status: DC | PRN

## 2021-02-28 MED ORDER — ACETAMINOPHEN 500 MG PO TABS
1000.0000 mg | ORAL_TABLET | Freq: Four times a day (QID) | ORAL | Status: DC
  Administered 2021-02-28 (×2): 1000 mg via ORAL

## 2021-02-28 MED ORDER — AMLODIPINE BESYLATE 2.5 MG PO TABS
2.5000 mg | ORAL_TABLET | Freq: Every day | ORAL | Status: DC
  Administered 2021-02-28: 2.5 mg via ORAL
  Filled 2021-02-28: qty 1

## 2021-02-28 MED ORDER — ONDANSETRON HCL 4 MG/2ML IJ SOLN
INTRAMUSCULAR | Status: DC | PRN
  Administered 2021-02-28 (×2): 4 mg via INTRAVENOUS

## 2021-02-28 MED ORDER — DEXAMETHASONE SODIUM PHOSPHATE 10 MG/ML IJ SOLN
INTRAMUSCULAR | Status: AC
  Filled 2021-02-28: qty 1

## 2021-02-28 MED ORDER — LACTATED RINGERS IV SOLN: INTRAVENOUS | Status: DC

## 2021-02-28 MED ORDER — PROPOFOL 500 MG/50ML IV EMUL
INTRAVENOUS | Status: DC | PRN
  Administered 2021-02-28: 50 ug/kg/min via INTRAVENOUS

## 2021-02-28 MED ORDER — KETOROLAC TROMETHAMINE 30 MG/ML IJ SOLN
INTRAMUSCULAR | Status: AC
  Filled 2021-02-28: qty 1

## 2021-02-28 MED ORDER — LACTATED RINGERS IV BOLUS: 500.0000 mL | Freq: Once | INTRAVENOUS | Status: DC

## 2021-02-28 MED ORDER — FENTANYL CITRATE (PF) 100 MCG/2ML IJ SOLN
INTRAMUSCULAR | Status: DC | PRN
  Administered 2021-02-28 (×5): 50 ug via INTRAVENOUS

## 2021-02-28 MED ORDER — EPHEDRINE 5 MG/ML INJ
INTRAVENOUS | Status: AC
  Filled 2021-02-28: qty 10

## 2021-02-28 MED ORDER — DOCUSATE SODIUM 100 MG PO CAPS
100.0000 mg | ORAL_CAPSULE | Freq: Two times a day (BID) | ORAL | Status: DC
  Administered 2021-02-28 (×2): 100 mg via ORAL

## 2021-02-28 MED ORDER — SIMETHICONE 80 MG PO CHEW: 80.0000 mg | CHEWABLE_TABLET | Freq: Four times a day (QID) | ORAL | Status: DC | PRN

## 2021-02-28 MED ORDER — ROCURONIUM BROMIDE 10 MG/ML (PF) SYRINGE
PREFILLED_SYRINGE | INTRAVENOUS | Status: AC
  Filled 2021-02-28: qty 10

## 2021-02-28 MED ORDER — BISACODYL 10 MG RE SUPP: 10.0000 mg | Freq: Every day | RECTAL | Status: DC | PRN

## 2021-02-28 MED ORDER — FENTANYL CITRATE (PF) 250 MCG/5ML IJ SOLN
INTRAMUSCULAR | Status: AC
  Filled 2021-02-28: qty 5

## 2021-02-28 MED ORDER — CEFAZOLIN SODIUM-DEXTROSE 2-4 GM/100ML-% IV SOLN
INTRAVENOUS | Status: AC
  Filled 2021-02-28: qty 100

## 2021-02-28 MED ORDER — ROCURONIUM BROMIDE 100 MG/10ML IV SOLN
INTRAVENOUS | Status: DC | PRN
  Administered 2021-02-28: 10 mg via INTRAVENOUS
  Administered 2021-02-28: 50 mg via INTRAVENOUS

## 2021-02-28 MED ORDER — POVIDONE-IODINE 10 % EX SWAB
2.0000 "application " | Freq: Once | CUTANEOUS | Status: AC
  Administered 2021-02-28: 2 via TOPICAL

## 2021-02-28 MED ORDER — KETOROLAC TROMETHAMINE 30 MG/ML IJ SOLN: 30.0000 mg | Freq: Once | INTRAMUSCULAR | Status: DC

## 2021-02-28 MED ORDER — LOSARTAN POTASSIUM 50 MG PO TABS
100.0000 mg | ORAL_TABLET | Freq: Every day | ORAL | Status: DC
  Administered 2021-02-28: 100 mg via ORAL
  Filled 2021-02-28: qty 2

## 2021-02-28 MED ORDER — SODIUM CHLORIDE 0.9 % IR SOLN
Status: DC | PRN
  Administered 2021-02-28: 400 mL

## 2021-02-28 MED ORDER — LOSARTAN POTASSIUM-HCTZ 100-25 MG PO TABS: 1.0000 | ORAL_TABLET | Freq: Every day | ORAL | Status: DC

## 2021-02-28 MED ORDER — LIDOCAINE HCL (CARDIAC) PF 100 MG/5ML IV SOSY
PREFILLED_SYRINGE | INTRAVENOUS | Status: DC | PRN
  Administered 2021-02-28: 60 mg via INTRAVENOUS

## 2021-02-28 SURGICAL SUPPLY — 51 items
BLADE SURG 15 STRL LF DISP TIS (BLADE) IMPLANT
BLADE SURG 15 STRL SS (BLADE)
CANISTER SUCT 3000ML PPV (MISCELLANEOUS) ×3 IMPLANT
CATH ROBINSON RED A/P 16FR (CATHETERS) ×4 IMPLANT
CATH URET 5FR 28IN OPEN ENDED (CATHETERS) ×1 IMPLANT
CLOTH BEACON ORANGE TIMEOUT ST (SAFETY) ×4 IMPLANT
COVER WAND RF STERILE (DRAPES) ×4 IMPLANT
DECANTER SPIKE VIAL GLASS SM (MISCELLANEOUS) ×1 IMPLANT
DRAPE C-ARM 42X120 X-RAY (DRAPES) ×1 IMPLANT
GAUZE 4X4 16PLY RFD (DISPOSABLE) ×4 IMPLANT
GAUZE PACKING 2X5 YD STRL (GAUZE/BANDAGES/DRESSINGS) ×4 IMPLANT
GLOVE ECLIPSE 8.0 STRL XLNG CF (GLOVE) ×1 IMPLANT
GLOVE ORTHO TXT STRL SZ7.5 (GLOVE) ×5 IMPLANT
GLOVE SRG 8 PF TXTR STRL LF DI (GLOVE) ×3 IMPLANT
GLOVE SURG ENC MOIS LTX SZ8 (GLOVE) ×4 IMPLANT
GLOVE SURG UNDER POLY LF SZ6.5 (GLOVE) ×4 IMPLANT
GLOVE SURG UNDER POLY LF SZ7 (GLOVE) ×4 IMPLANT
GLOVE SURG UNDER POLY LF SZ8 (GLOVE) ×4
GOWN STRL REUS W/TWL LRG LVL3 (GOWN DISPOSABLE) ×12 IMPLANT
GOWN STRL REUS W/TWL XL LVL3 (GOWN DISPOSABLE) ×4 IMPLANT
GUIDEWIRE STR DUAL SENSOR (WIRE) ×1 IMPLANT
KIT TURNOVER CYSTO (KITS) ×4 IMPLANT
MANIFOLD NEPTUNE II (INSTRUMENTS) ×1 IMPLANT
NDL MAYO CATGUT SZ4 TPR NDL (NEEDLE) IMPLANT
NEEDLE HYPO 22GX1.5 SAFETY (NEEDLE) IMPLANT
NEEDLE MAYO CATGUT SZ4 (NEEDLE) IMPLANT
NS IRRIG 1000ML POUR BTL (IV SOLUTION) ×3 IMPLANT
NS IRRIG 500ML POUR BTL (IV SOLUTION) ×4 IMPLANT
PACK TRENDGUARD 450 HYBRID PRO (MISCELLANEOUS) IMPLANT
PACK VAGINAL WOMENS (CUSTOM PROCEDURE TRAY) ×4 IMPLANT
PAD OB MATERNITY 4.3X12.25 (PERSONAL CARE ITEMS) ×4 IMPLANT
SET IRRIG Y TYPE TUR BLADDER L (SET/KITS/TRAYS/PACK) ×1 IMPLANT
SHEARS FOC LG CVD HARMONIC 17C (MISCELLANEOUS) ×4 IMPLANT
SUT CHROMIC 1 TIES 18 (SUTURE) IMPLANT
SUT CHROMIC 1MO 4 18 CR8 (SUTURE) ×4 IMPLANT
SUT PROLENE 1 CTX 30  8455H (SUTURE)
SUT PROLENE 1 CTX 30 8455H (SUTURE) IMPLANT
SUT SILK 0 SH 30 (SUTURE) IMPLANT
SUT SILK 2 0 SH (SUTURE) ×4 IMPLANT
SUT VIC AB 2-0 CT1 (SUTURE) IMPLANT
SUT VIC AB 2-0 CT1 27 (SUTURE) ×4
SUT VIC AB 2-0 CT1 TAPERPNT 27 (SUTURE) ×3 IMPLANT
SUT VIC AB 2-0 CT2 27 (SUTURE) ×16 IMPLANT
SUT VIC AB 3-0 CT1 27 (SUTURE) ×4
SUT VIC AB 3-0 CT1 TAPERPNT 27 (SUTURE) IMPLANT
SUT VIC AB 3-0 SH 27 (SUTURE)
SUT VIC AB 3-0 SH 27X BRD (SUTURE) IMPLANT
SUT VICRYL 0 UR6 27IN ABS (SUTURE) IMPLANT
TOWEL OR 17X26 10 PK STRL BLUE (TOWEL DISPOSABLE) ×7 IMPLANT
TRAY FOLEY W/BAG SLVR 14FR LF (SET/KITS/TRAYS/PACK) ×4 IMPLANT
TRENDGUARD 450 HYBRID PRO PACK (MISCELLANEOUS)

## 2021-02-28 NOTE — Interval H&P Note (Signed)
History and Physical Interval Note:  02/28/2021 8:15 AM  Victoria [name]  has presented today for surgery, with the diagnosis of relaxaion of pelvic floor.  The various methods of treatment have been discussed with the patient and family. After consideration of risks, benefits and other options for treatment, the patient has consented to  Procedure(s): HYSTERECTOMY VAGINAL (N/A) ANTERIOR (CYSTOCELE) AND POSTERIOR REPAIR (RECTOCELE) (N/A) CYSTOSCOPY (N/A) as a surgical intervention.  The patient's history has been reviewed, patient examined, no change in status, stable for surgery.  I have reviewed the patient's chart and labs.  Questions were answered to the patient's satisfaction.     Blane Ohara Meisinger

## 2021-02-28 NOTE — Op Note (Signed)
Preoperative diagnosis: Pelvic relaxation Postop diagnosis: Same Procedure: Total vaginal hysterectomy, probable right salpingectomy, posterior colporrhaphy and cystoscopy Surgeon: Cheri Fowler M.D. Assistant: Crawford Givens, D.O. Anesthesia: Gen. Findings: Patient had a normal uterus that came to introitus, normal right ovary and tibe, left tube and ovary not seen. Via cystoscopy bladder was normal, right ureter was patent, I could not document left ureteral patency so Dr. Gloriann Loan of Urology was consulted, left retrograde pyelogram with patent ureter.  Assistance from Dr. Terri Piedra was necessary throughout the case to help expose pedicles and control bleeding Estimated blood loss: 50 cc Specimens: Uterus and one fallopian tube for routine pathology Complications: None   Procedure in detail: The patient was taken to the operating room and placed in the dorsosupine position. General anesthesia was induced and she was placed in mobile stirrups. Legs were elevated in the stirrups. Perineum and vagina were then prepped and draped in the usual sterile fashion, bladder drained with red Robinson catheter. A Graves speculum was inserted in the vagina and the cervix was grasped with Ardis Hughs tenaculum. Dilute Pitressin with Marcaine was then instilled at the cervicovaginal junction which was then incised circumferentially with electrocautery. Sharp dissection was then used to further free the vagina from the cervix. Anterior peritoneum was identified and entered sharply. A Deaver retractor was used to retract the bladder anteriorly. Posterior cul-de-sac was identified and entered sharply. A Bonnano speculum was placed into the posterior cul-de-sac. Uterosacral ligaments were clamped transected and ligated with #1 chromic and tagged for later use. The remaining pedicles were then taken down with harmonic scalpel.  When just the tubes were still attached, the uterus was delivered posteriorly and I was able to identify what  I believe was the right tube, but not the left.  Harmonic scalpel was the used to free the tube and any remaining attachments of the the uterus, the uterus and tube were removed. Right ovary appeared normal, left ovary not visualizedl. A small amount of bleeding was controlled with the Hormonic scalpel.  The uterosacral ligaments were then plicated in the midline with 2-0 silk after the Bonnano speculum was removed and a shorter vaginal speculum was placed. A Chromic suture was placed from the vagina, through the left uterosacral ligament, across the peritoneum, through the right uterosacral ligament, back into the vagina, and tagged to be tied later. The previously tagged uterosacral pedicles were also tied in the midline. No significant bleeding was identified.    Vagina was inspected, there was no significant cystocele, Gr I+ rectocele, so attention was now turned to the rectocele repair. The hymenal ring was grasped with 2 Allis clamps at a distance that when brought together would still allow two fingers to  easily pass into the vagina. A horizontal piece of tissue was removed. The posterior vaginal mucosa was then dissected in the midline from the hymenal ring to the vaginal apex. Rectocele was then mobilized bilaterally sharply and bluntly. The rectocele was then reduced with interrupted sutures of 2-0 Vicryl after a rectal exam confirmed this was all rectocele. This achieved good reduction. Excess vaginal mucosa was then removed sharply. Vagina was closed from the vaginal apex to the hymenal ring with running locking 2-0 Vicryl.  This achieved adequate closure and adequate hemostasis.  The vaginal cuff was then also closed in a vertical fashion with running, locking 2-0 Vicryl with good closure and hemoatasis  Attention was turned to cystoscopy.  A 70 cystoscope was inserted and 200 cc of fluid was instilled. The bladder  appeared normal. Both ureteral orifices were easily identified and urine was seen  to flow freely from the right side.  Although the left ureteral orifice was seen to peristalse, I did not see any urine.  I attempted to pass an 7fr stent into the ureter, it would not advance past 2 cm.  Dr. Gloriann Loan of urology was consulted and performed a left retrograde pyelogram which demonstrated a patent ureter.  Once the stent was removed the contrast was seen to flow freely from the ureteral orifice. The cystoscope was removed and a foley catheter was placed. The vagina was then packed with 2 inch gauze coated with lubricant. The patient tolerated the procedure well. She was awakened and taken to the recovery in stable condition. Counts were correct, she had PAS hose on throughout the procedure. She received Ancef 2 g for surgical prophylaxis.

## 2021-02-28 NOTE — Transfer of Care (Signed)
Immediate Anesthesia Transfer of Care Note  Patient: Victoria [name]  Procedure(s) Performed: Procedure(s) (LRB): HYSTERECTOMY VAGINAL WITH UNILATERAL SALPINGECTOMY (N/A) ANTERIOR (CYSTOCELE) AND POSTERIOR REPAIR (RECTOCELE) (N/A) CYSTOSCOPY (N/A) CYSTOSCOPY WITH RETROGRADE PYELOGRAM (Left)  Patient Location: PACU  Anesthesia Type: General  Level of Consciousness: awake, sedated, patient cooperative and responds to stimulation  Airway & Oxygen Therapy: Patient Spontanous Breathing and Patient connected to [place] 02 and soft FM   Post-op Assessment: Report given to PACU RN, Post -op Vital signs reviewed and stable and Patient moving all extremities  Post vital signs: Reviewed and stable  Complications: No apparent anesthesia complications

## 2021-02-28 NOTE — Anesthesia Postprocedure Evaluation (Signed)
Anesthesia Post Note  Patient: Victoria [name]  Procedure(s) Performed: HYSTERECTOMY VAGINAL WITH UNILATERAL SALPINGECTOMY (N/A Vagina ) ANTERIOR (CYSTOCELE) AND POSTERIOR REPAIR (RECTOCELE) (N/A Vagina ) CYSTOSCOPY (N/A Bladder) CYSTOSCOPY WITH RETROGRADE PYELOGRAM (Left Ureter)     Patient location during evaluation: PACU Anesthesia Type: General Level of consciousness: awake and alert Pain management: pain level controlled Vital Signs Assessment: post-procedure vital signs reviewed and stable Respiratory status: spontaneous breathing, nonlabored ventilation, respiratory function stable and patient connected to nasal cannula oxygen Cardiovascular status: blood pressure returned to baseline and stable Postop Assessment: no apparent nausea or vomiting Anesthetic complications: no   No complications documented.  Last Vitals:  Vitals:   02/28/21 1211 02/28/21 1315  BP: 123/84 117/72  Pulse: (!) 57 70  Resp: 16 20  Temp: (!) 36.3 C 36.4 C  SpO2: 94% 98%    Last Pain:  Vitals:   02/28/21 1142  TempSrc:   PainSc: 3                  Tiajuana Amass

## 2021-02-28 NOTE — Anesthesia Preprocedure Evaluation (Signed)
Anesthesia Evaluation  Patient identified by MRN, date of birth, ID band Patient awake    Reviewed: Allergy & Precautions, NPO status , Patient's Chart, lab work & pertinent test results  Airway Mallampati: II  TM Distance: >3 FB Neck ROM: Full    Dental  (+) Dental Advisory Given   Pulmonary neg pulmonary ROS,    breath sounds clear to auscultation       Cardiovascular hypertension, Pt. on medications  Rhythm:Regular Rate:Normal     Neuro/Psych negative neurological ROS     GI/Hepatic negative GI ROS, (+) Hepatitis -  Endo/Other  diabetes, Type 2  Renal/GU negative Renal ROS     Musculoskeletal   Abdominal   Peds  Hematology negative hematology ROS (+)   Anesthesia Other Findings   Reproductive/Obstetrics                             Anesthesia Physical Anesthesia Plan  ASA: II  Anesthesia Plan: General   Post-op Pain Management:    Induction: Intravenous  PONV Risk Score and Plan: 4 or greater and Ondansetron, Dexamethasone, Propofol infusion and Treatment may vary due to age or medical condition  Airway Management Planned: Oral ETT  Additional Equipment: None  Intra-op Plan:   Post-operative Plan: Extubation in OR  Informed Consent: I have reviewed the patients History and Physical, chart, labs and discussed the procedure including the risks, benefits and alternatives for the proposed anesthesia with the patient or authorized representative who has indicated his/her understanding and acceptance.     Dental advisory given  Plan Discussed with: CRNA  Anesthesia Plan Comments:         Anesthesia Quick Evaluation

## 2021-02-28 NOTE — Progress Notes (Signed)
Pt still having low urine output and is concerned about kidney function. Bladder scan revealed zero urine. Called Dr. Willis Modena and will add BMET to morning labs.

## 2021-02-28 NOTE — Op Note (Signed)
Operative Note  Preoperative diagnosis:  1.  Concern for left ureteral injury  Postoperative diagnosis: 1.  No left ureteral injury  Procedure(s): 1.  Cystoscopy with left retrograde pyelogram  Surgeon: Link Snuffer, MD  Assistants: None  Anesthesia: General  Complications: None immediate  EBL: None  Specimens: 1.  None  Drains/Catheters: 1.  None  Intraoperative findings: Normal urethra and bladder 2.  Left retrograde pyelogram revealed no hydronephrosis.  Entire ureter appeared normal without obstruction.  Indication: 80 year old female undergoing a vaginal hysterectomy with anterior cystocele repair as well as rectocele repair.  After the procedure cystoscopy was performed with good E flux of urine from the right ureter.  None was noted from the left ureter.  An open-ended ureteral catheter was passed that met resistance.  I was called to assess to rule out any injury.  Description of procedure:  Upon entry, the patient was in lithotomy position under general anesthesia.  A 19.5 French cystoscope was advanced into the urethra and into the bladder.  Complete cystoscopy was performed with findings noted above.  An open-ended ureteral catheter was advanced into the ureteral orifice and a retrograde pyelogram was performed.  There was some distal J hooking of the ureter which was the explanation for mild resistance from the ureteral catheter.  There was no hydronephrosis or evidence of any extravasation or injury.  After removal of the catheter, there was excellent efflux of the contrast that was instilled.  It was nonbloody.  This concluded my portion of the operation.   Plan: No further intervention necessary.  The case was turned over to gynecology.

## 2021-02-28 NOTE — Progress Notes (Signed)
Post-op check, s/p TVH, posterior repair  Doing well, pain tolerable, no n/v Afeb, VSS UOP-125cc since surgery Abd- soft, NT  Doing well, probably a bit dehydrated-getting IV bolus, continue routine care, will remove foley and vaginal packing in am, continue SSI overnight

## 2021-02-28 NOTE — Anesthesia Procedure Notes (Signed)
Procedure Name: Intubation Date/Time: 02/28/2021 8:27 AM Performed by: Justice Rocher, CRNA Pre-anesthesia Checklist: Patient identified, Emergency Drugs available, Suction available, Patient being monitored and Timeout performed Patient Re-evaluated:Patient Re-evaluated prior to induction Oxygen Delivery Method: Circle system utilized Preoxygenation: Pre-oxygenation with 100% oxygen Induction Type: IV induction Ventilation: Mask ventilation without difficulty Laryngoscope Size: Mac and 3 Grade View: Grade II Tube type: Oral Tube size: 7.5 mm Number of attempts: 1 Airway Equipment and Method: Stylet and Oral airway Placement Confirmation: ETT inserted through vocal cords under direct vision,  positive ETCO2,  breath sounds checked- equal and bilateral and CO2 detector Secured at: 23 cm Tube secured with: Tape Dental Injury: Teeth and Oropharynx as per pre-operative assessment

## 2021-03-01 ENCOUNTER — Encounter (HOSPITAL_BASED_OUTPATIENT_CLINIC_OR_DEPARTMENT_OTHER): Payer: Self-pay | Admitting: Obstetrics and Gynecology

## 2021-03-01 DIAGNOSIS — N736 Female pelvic peritoneal adhesions (postinfective): Secondary | ICD-10-CM | POA: Diagnosis not present

## 2021-03-01 DIAGNOSIS — D251 Intramural leiomyoma of uterus: Secondary | ICD-10-CM | POA: Diagnosis not present

## 2021-03-01 DIAGNOSIS — N72 Inflammatory disease of cervix uteri: Secondary | ICD-10-CM | POA: Diagnosis not present

## 2021-03-01 DIAGNOSIS — N816 Rectocele: Secondary | ICD-10-CM | POA: Diagnosis not present

## 2021-03-01 DIAGNOSIS — N8189 Other female genital prolapse: Secondary | ICD-10-CM | POA: Diagnosis not present

## 2021-03-01 DIAGNOSIS — Z888 Allergy status to other drugs, medicaments and biological substances status: Secondary | ICD-10-CM | POA: Diagnosis not present

## 2021-03-01 DIAGNOSIS — N800 Endometriosis of uterus: Secondary | ICD-10-CM | POA: Diagnosis not present

## 2021-03-01 LAB — GLUCOSE, CAPILLARY
Glucose-Capillary: 121 mg/dL — ABNORMAL HIGH (ref 70–99)
Glucose-Capillary: 122 mg/dL — ABNORMAL HIGH (ref 70–99)
Glucose-Capillary: 140 mg/dL — ABNORMAL HIGH (ref 70–99)

## 2021-03-01 LAB — CBC
HCT: 32.1 % — ABNORMAL LOW (ref 36.0–46.0)
Hemoglobin: 10.6 g/dL — ABNORMAL LOW (ref 12.0–15.0)
MCH: 27.2 pg (ref 26.0–34.0)
MCHC: 33 g/dL (ref 30.0–36.0)
MCV: 82.5 fL (ref 80.0–100.0)
Platelets: 198 10*3/uL (ref 150–400)
RBC: 3.89 MIL/uL (ref 3.87–5.11)
RDW: 15.1 % (ref 11.5–15.5)
WBC: 9.9 10*3/uL (ref 4.0–10.5)
nRBC: 0 % (ref 0.0–0.2)

## 2021-03-01 LAB — BASIC METABOLIC PANEL
Anion gap: 10 (ref 5–15)
BUN: 23 mg/dL (ref 8–23)
CO2: 22 mmol/L (ref 22–32)
Calcium: 8.3 mg/dL — ABNORMAL LOW (ref 8.9–10.3)
Chloride: 95 mmol/L — ABNORMAL LOW (ref 98–111)
Creatinine, Ser: 0.98 mg/dL (ref 0.44–1.00)
GFR, Estimated: 59 mL/min — ABNORMAL LOW (ref >60)
Glucose, Bld: 133 mg/dL — ABNORMAL HIGH (ref 70–99)
Potassium: 4.1 mmol/L (ref 3.5–5.1)
Sodium: 127 mmol/L — ABNORMAL LOW (ref 135–145)

## 2021-03-01 LAB — SURGICAL PATHOLOGY

## 2021-03-01 MED ORDER — IBUPROFEN 800 MG PO TABS: 800.0000 mg | ORAL_TABLET | Freq: Three times a day (TID) | ORAL | 0 refills | Status: DC | PRN

## 2021-03-01 MED ORDER — GABAPENTIN 300 MG PO CAPS: 300.0000 mg | ORAL_CAPSULE | Freq: Three times a day (TID) | ORAL | 0 refills | Status: DC

## 2021-03-01 MED ORDER — OXYCODONE HCL 5 MG PO TABS
ORAL_TABLET | ORAL | Status: AC
  Filled 2021-03-01: qty 1

## 2021-03-01 MED ORDER — MENTHOL 3 MG MT LOZG
LOZENGE | OROMUCOSAL | Status: AC
  Filled 2021-03-01: qty 9

## 2021-03-01 MED ORDER — OXYCODONE HCL 5 MG PO TABS: 5.0000 mg | ORAL_TABLET | ORAL | 0 refills | Status: DC | PRN

## 2021-03-01 MED ORDER — OXYCODONE HCL 5 MG PO TABS
ORAL_TABLET | ORAL | Status: AC
  Filled 2021-03-01: qty 2

## 2021-03-01 NOTE — Discharge Instructions (Signed)
Vaginal Hysterectomy, Care After The following information offers guidance on how to care for yourself after your procedure. Your health care provider may also give you more specific instructions. If you have problems or questions, contact your health care provider. What can I expect after the procedure? After the procedure, it is common to have:  Pain in the lower abdomen and vagina.  Vaginal bleeding and discharge for up to 1 week. You will need to use a sanitary pad after this procedure.  Difficulty having a bowel movement (constipation).  Temporary problems emptying the bladder.  Tiredness (fatigue).  Poor appetite.  Less interest in sex.  Feelings of sadness or other emotions. If your ovaries were also removed, it is also common to have symptoms of menopause, such as hot flashes, night sweats, and lack of sleep (insomnia). Follow these instructions at home: Medicines  Take over-the-counter and prescription medicines only as told by your health care provider.  Do not take aspirin or NSAIDs, such as ibuprofen. These medicines can cause bleeding.  Ask your health care provider if the medicine prescribed to you: ? Requires you to avoid driving or using machinery. ? Can cause constipation. You may need to take these actions to prevent or treat constipation:  Drink enough fluid to keep your urine pale yellow.  Take over-the-counter or prescription medicines.  Eat foods that are high in fiber, such as beans, whole grains, and fresh fruits and vegetables.  Limit foods that are high in fat and processed sugars, such as fried or sweet foods.   Activity  Rest as told by your health care provider.  Return to your normal activities as told by your health care provider. Ask your health care provider what activities are safe for you  Avoid sitting for a long time without moving. Get up to take short walks every 1-2 hours. This is important to improve blood flow and breathing. Ask  for help if you feel weak or unsteady.  Try to have someone home with you for 1-2 weeks to help you with everyday chores.  Do not lift anything that is heavier than 10 lb (4.5 kg), or the limit that you are told, until your health care provider says that it is safe.  If you were given a sedative during the procedure, it can affect you for several hours. Do not drive or operate machinery until your health care provider says that it is safe.   Lifestyle  Do not use any products that contain nicotine or tobacco. These products include cigarettes, chewing tobacco, and vaping devices, such as e-cigarettes. These can delay healing after surgery. If you need help quitting, ask your health care provider.  Do not drink alcohol until your health care provider approves. General instructions  Do not douche, use tampons, or have sex for at least 6 weeks, or as told by your health care provider.  If you struggle with physical or emotional changes after your procedure, speak with your health care provider or a therapist.  The stitches inside your vagina will dissolve over time and do not need to be taken out.  Do not take baths, swim, or use a hot tub until your health care provider approves. You may only be allowed to take showers for 2-3 weeks  Wear compression stockings as told by your health care provider. These stockings help to prevent blood clots and reduce swelling in your legs.  Keep all follow-up visits. This is important. Contact a health care provider if:  Your pain medicine is not helping.  You have a fever.  You have nausea or vomiting that does not go away.  You feel dizzy.  You have blood, pus, or a bad-smelling discharge from your vagina more than 1 week after the procedure.  You continue to have trouble urinating 3-5 days after the procedure. Get help right away if:  You have severe pain in your abdomen or back.  You faint.  You have heavy vaginal bleeding and blood  clots, soaking through a sanitary pad in less than 1 hour.  You have chest pain or shortness of breath.  You have pain, swelling, or redness in your leg. These symptoms may represent a serious problem that is an emergency. Do not wait to see if the symptoms will go away. Get medical help right away. Call your local emergency services (911 in the [place] Do not drive yourself to the hospital. Summary  After the procedure, it is common to have pain, vaginal bleeding, constipation, temporary problems emptying your bladder, and feelings of sadness or other emotions.  Take over-the-counter and prescription medicines only as told by your health care provider.  Rest as told by your health care provider. Return to your normal activities as told by your health care provider.  Contact a health care provider if your pain medicine is not helping, or you have a fever, dizziness, or trouble urinating several days after the procedure.  Get help right away if you have severe pain in your abdomen or back, or if you faint, have heavy bleeding, or have chest pain or shortness of breath. This information is not intended to replace advice given to you by your health care provider. Make sure you discuss any questions you have with your health care provider. Document Revised: 08/11/2020 Document Reviewed: 08/11/2020 Elsevier Patient Education  [place]. Routine instructions for vaginal hysterectomy

## 2021-03-01 NOTE — Discharge Summary (Signed)
Physician Discharge Summary  Patient ID: Victoria [name] MRN: [id number] DOB/AGE: Dec 04, 1941 80 y.o.  Admit date: 02/28/2021 Discharge date: 03/01/2021  Admission Diagnoses:  Pelvic relaxation  Discharge Diagnoses: Same Active Problems:   Pelvic relaxation   S/P vaginal hysterectomy   Discharged Condition: good  Hospital Course: Admitted, underwent TVH, right salpingectomy, posterior repair, cystoscopy with retrograde left pyleogram.  Met postoperative milestones, stable for discharge POD #1   Discharge Exam: Blood pressure (!) 152/84, pulse 69, temperature 98.6 F (37 C), resp. rate 16, height $RemoveBe'5\' 8"'RTVhUwPhz$  (1.727 m), weight 96 kg, SpO2 97 %. General appearance: alert  Disposition: Discharge disposition: 01-Home or Self Care       Discharge Instructions    Call MD for:  difficulty breathing, headache or visual disturbances   Complete by: As directed    Call MD for:  extreme fatigue   Complete by: As directed    Call MD for:  persistant dizziness or light-headedness   Complete by: As directed    Call MD for:  persistant nausea and vomiting   Complete by: As directed    Call MD for:  severe uncontrolled pain   Complete by: As directed    Call MD for:  temperature >100.4   Complete by: As directed    Diet - low sodium heart healthy   Complete by: As directed    Increase activity slowly   Complete by: As directed    Lifting restrictions   Complete by: As directed    10 lbs     Allergies as of 03/01/2021      Reactions   Lisinopril Cough      Medication List    TAKE these medications   ALPRAZolam 0.5 MG tablet Commonly known as: XANAX TAKE 1 TABLET(0.5 MG) BY MOUTH TWICE DAILY AS NEEDED FOR ANXIETY   amLODipine 2.5 MG tablet Commonly known as: NORVASC TAKE 1 TABLET(2.5 MG) BY MOUTH AT BEDTIME What changed: when to take this   cholecalciferol 25 MCG (1000 UNIT) tablet Commonly known as: VITAMIN D3 Take 1,000 Units by mouth daily.   ELDERBERRY PO Take 1  tablet by mouth daily.   freestyle lancets CHECK GLUCOSE TWICE A DAY   FREESTYLE TEST STRIPS test strip Generic drug: glucose blood CHECK GLUCOSE FOUR TIMES A DAY   gabapentin 300 MG capsule Commonly known as: NEURONTIN Take 1 capsule (300 mg total) by mouth 3 (three) times daily for 2 days. Notes to patient: Can take next dose at 430pm   ibuprofen 100 MG tablet Commonly known as: ADVIL Take 100 mg by mouth every 6 (six) hours as needed for pain or fever. What changed: Another medication with the same name was added. Make sure you understand how and when to take each.   ibuprofen 800 MG tablet Commonly known as: ADVIL Take 1 tablet (800 mg total) by mouth every 8 (eight) hours as needed for moderate pain. What changed: You were already taking a medication with the same name, and this prescription was added. Make sure you understand how and when to take each.   losartan-hydrochlorothiazide 100-25 MG tablet Commonly known as: HYZAAR Take 1 tablet by mouth daily. What changed: when to take this   metFORMIN 500 MG tablet Commonly known as: GLUCOPHAGE TAKE 1 TABLET(500 MG) BY MOUTH TWICE DAILY WITH A MEAL   oxyCODONE 5 MG immediate release tablet Commonly known as: Oxy IR/ROXICODONE Take 1 tablet (5 mg total) by mouth every 4 (four) hours as needed for severe  pain. Notes to patient: Can take next dose at 1230pm   ZYRTEC ALLERGY PO Take 1 tablet by mouth daily as needed (allergies).       Follow-up Information    Maysen Bonsignore, MD. Schedule an appointment as soon as possible for a visit in 6 week(s).   Specialty: Obstetrics and Gynecology Contact information: [address], [address]  [place] [address] [phone]               Signed: Blane Ohara Cornel Werber 03/01/2021, 12:50 PM

## 2021-03-01 NOTE — Progress Notes (Signed)
Dr. Willis Modena came in this morning and removed the vaginal packing and the catheter from the patient. Waiting on patient to void now.

## 2021-03-01 NOTE — Progress Notes (Signed)
1 Day Post-Op Procedure(s) (LRB): HYSTERECTOMY VAGINAL WITH UNILATERAL SALPINGECTOMY (N/A)  POSTERIOR REPAIR (RECTOCELE) (N/A) CYSTOSCOPY (N/A) CYSTOSCOPY WITH RETROGRADE PYELOGRAM (Left)  Subjective: Patient reports incisional pain and tolerating PO.    Objective: I have reviewed patient's vital signs, intake and output, medications and labs.  GI: soft, NT Vaginal Bleeding: minimal, vaginal packing and foley removed  Assessment: s/p Procedure(s): HYSTERECTOMY VAGINAL WITH UNILATERAL SALPINGECTOMY (N/A) ANTERIOR (CYSTOCELE) AND POSTERIOR REPAIR (RECTOCELE) (N/A) CYSTOSCOPY (N/A) CYSTOSCOPY WITH RETROGRADE PYELOGRAM (Left): stable and unstable  Plan: Advance diet Encourage ambulation Discharge home  LOS: 0 days    Clarene Duke 03/01/2021, 7:18 AM

## 2021-05-03 ENCOUNTER — Other Ambulatory Visit: Payer: Self-pay | Admitting: Internal Medicine

## 2021-05-03 ENCOUNTER — Telehealth: Payer: Self-pay | Admitting: Internal Medicine

## 2021-05-03 ENCOUNTER — Encounter: Payer: Self-pay | Admitting: Internal Medicine

## 2021-05-03 NOTE — Telephone Encounter (Signed)
I called local [facility]. Could not get pharmacist to phone. Was told I could leave a voice mail about refilling Xanax here locally Advised they give her #60 tabs Xanax 0.5 mg one by mouth twice daily as needed and gave DEA number as well as my call back number here at office.  MJB, MD

## 2021-05-03 NOTE — Telephone Encounter (Signed)
Ayslin Kundert [phone]  [name] called to say she was out of her below medication and that she had called [facility] mail in pharmacy and they said she will get it in 5-7 days which is the same thing they told her last week, and they told her to call her doctor and get it locally. So when I talked with her I seen she still had refills at [facility], she called them and they told her it was too soon to get a refill.  I called and spoke with [facility], they had received your refill from today, when they run it, it shows where [facility] run it on 04/25/2021. That is why it is too soon.  I called [facility] they had in fact filled the prescription on 04/25/2021, however it did not ship out until 05/01/2021, because of an issue the were having with the plant in [place], they do not know when patient will get her medication.  She is completely out, so she can medication at [facility] and pay out of pocket with your okay.  ALPRAZolam (XANAX) 0.5 MG tablet  [id number] [address], [address] Phone:  [phone]  Fax:  [phone]

## 2021-05-17 ENCOUNTER — Encounter: Payer: Self-pay | Admitting: Internal Medicine

## 2021-05-17 DIAGNOSIS — Z961 Presence of intraocular lens: Secondary | ICD-10-CM | POA: Diagnosis not present

## 2021-05-17 DIAGNOSIS — E119 Type 2 diabetes mellitus without complications: Secondary | ICD-10-CM | POA: Diagnosis not present

## 2021-05-17 DIAGNOSIS — Z7984 Long term (current) use of oral hypoglycemic drugs: Secondary | ICD-10-CM | POA: Diagnosis not present

## 2021-05-17 LAB — HM DIABETES EYE EXAM

## 2021-05-23 DIAGNOSIS — H524 Presbyopia: Secondary | ICD-10-CM | POA: Diagnosis not present

## 2021-05-24 ENCOUNTER — Other Ambulatory Visit: Payer: Medicare HMO | Admitting: Internal Medicine

## 2021-05-24 ENCOUNTER — Other Ambulatory Visit: Payer: Self-pay

## 2021-05-24 DIAGNOSIS — E119 Type 2 diabetes mellitus without complications: Secondary | ICD-10-CM

## 2021-05-24 DIAGNOSIS — I10 Essential (primary) hypertension: Secondary | ICD-10-CM | POA: Diagnosis not present

## 2021-05-25 LAB — HEMOGLOBIN A1C
Hgb A1c MFr Bld: 6.2 % of total Hgb — ABNORMAL HIGH (ref <5.7)
Mean Plasma Glucose: 131 mg/dL
eAG (mmol/L): 7.3 mmol/L

## 2021-05-25 LAB — HEPATIC FUNCTION PANEL
AG Ratio: 1.5 (calc) (ref 1.0–2.5)
ALT: 8 U/L (ref 6–29)
AST: 13 U/L (ref 10–35)
Albumin: 4.2 g/dL (ref 3.6–5.1)
Alkaline phosphatase (APISO): 62 U/L (ref 37–153)
Bilirubin, Direct: 0.1 mg/dL (ref 0.0–0.2)
Globulin: 2.8 g/dL (calc) (ref 1.9–3.7)
Indirect Bilirubin: 0.4 mg/dL (calc) (ref 0.2–1.2)
Total Bilirubin: 0.5 mg/dL (ref 0.2–1.2)
Total Protein: 7 g/dL (ref 6.1–8.1)

## 2021-05-25 LAB — LIPID PANEL
Cholesterol: 161 mg/dL (ref <200)
HDL: 51 mg/dL (ref >=50)
LDL Cholesterol (Calc): 93 mg/dL (calc)
Non-HDL Cholesterol (Calc): 110 mg/dL (calc) (ref <130)
Total CHOL/HDL Ratio: 3.2 (calc) (ref <5.0)
Triglycerides: 80 mg/dL (ref <150)

## 2021-05-29 ENCOUNTER — Ambulatory Visit: Payer: Medicare Other | Admitting: Internal Medicine

## 2021-06-05 ENCOUNTER — Other Ambulatory Visit: Payer: Self-pay

## 2021-06-05 ENCOUNTER — Ambulatory Visit (INDEPENDENT_AMBULATORY_CARE_PROVIDER_SITE_OTHER): Payer: Medicare HMO | Admitting: Internal Medicine

## 2021-06-05 VITALS — BP 100/70 | HR 73 | Ht 68.0 in | Wt 214.0 lb

## 2021-06-05 DIAGNOSIS — F411 Generalized anxiety disorder: Secondary | ICD-10-CM

## 2021-06-05 DIAGNOSIS — Z8619 Personal history of other infectious and parasitic diseases: Secondary | ICD-10-CM | POA: Diagnosis not present

## 2021-06-05 DIAGNOSIS — R1030 Lower abdominal pain, unspecified: Secondary | ICD-10-CM | POA: Diagnosis not present

## 2021-06-05 DIAGNOSIS — Z6832 Body mass index (BMI) 32.0-32.9, adult: Secondary | ICD-10-CM | POA: Diagnosis not present

## 2021-06-05 DIAGNOSIS — I10 Essential (primary) hypertension: Secondary | ICD-10-CM | POA: Diagnosis not present

## 2021-06-05 DIAGNOSIS — Z87898 Personal history of other specified conditions: Secondary | ICD-10-CM | POA: Diagnosis not present

## 2021-06-05 DIAGNOSIS — E119 Type 2 diabetes mellitus without complications: Secondary | ICD-10-CM

## 2021-06-05 DIAGNOSIS — Z853 Personal history of malignant neoplasm of breast: Secondary | ICD-10-CM

## 2021-06-05 NOTE — Progress Notes (Signed)
   Subjective:    Patient ID: Victoria Victoria [name], female    DOB: [birth year], 80 y.o.   MRN: [id number]  HPI  80 year old Female for 6 month recheck.   Has noticed some discomfort intermittently below umbilicus. Is concerned about this.  Patient requests referral for this.  Had colonoscopy by Dr. Silverio Decamp in July 2020.   Had laparoscopic cholecystectomy by Dr. Lucia Gaskins in 15-Mar-2013 after presenting with pancreatitis December 2013.   Had history of gallstones, chronic cholecystitis with history of pancreatitis related to gallstones.  Had liver biopsy at the same time showing cirrhosis.    History of Hepatitis C.  Eventually took [place] for Hepatitis C with excellent results.    She did have vaginal hysterectomy, right salpingectomy posterior repair by Dr. Willis Modena March 15, 2021.  She has been vaccinated and boosted for COVID-19.  She has a history of essential hypertension and diabetes mellitus.  Diabetes treated with oral medications.  History of partial left thyroidectomy in [place] in 03/16/71.  TSH has always been normal.    There is a remote history of breast cancer.  Please see dictation December 2021 for details.  Mammogram scheduled for Victoria [name].  Social history: She is retired.  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  She does not smoke or consume alcohol.  Family history: Sister died of colon cancer around Mar 16, 2011.  History of headaches which may be migraine type headaches.  Gets vertigo sometimes.  She recalls acute vertigo in relationship to a cruise she took several years ago.     Review of Systems-no dysuria, nausea ,vomiting or significant constipation     Objective:   Physical Exam Blood pressure 100/70 pulse 73 regular pulse oximetry 97% weight 214 pounds BMI 32.54  Skin: Warm and dry.  No cervical adenopathy or thyromegaly.  No carotid bruits.  Chest clear to auscultation.  Cardiac exam: Regular rate and rhythm without ectopy.   Abdomen soft nondistended without hepatosplenomegaly.  There is some thickening midline below the umbilicus with some mild tenderness without rebound.  I am thinking this may be scar tissue       Assessment & Plan:  Patient is concerned about pain below the umbilicus and discomfort.  She will be referred to Gastroenterology for evaluation.  I do not think this is a surgical issue at this point in time.  It may be scar tissue from previous surgeries.  History of recurrent vertigo-treated with Antivert as needed  History of anxiety treated with Xanax  History of Hepatitis C-treated with Harvoni  Essential hypertension-stable on current regimen of losartan HCTZ and amlodipine  BMI 32.54  Type 2 diabetes mellitus-stable with hemoglobin A1c 6.2% treated with metformin  Patient will return here in 6 months for Medicare wellness and health maintenance exam.  Referral to GI regarding her lower abdomen concerns with palpable thickening below the umbilicus.

## 2021-06-20 ENCOUNTER — Telehealth: Payer: Self-pay | Admitting: Internal Medicine

## 2021-06-20 NOTE — Telephone Encounter (Signed)
After talking with Dr Renold Genta scheduled Car Visit

## 2021-06-20 NOTE — Telephone Encounter (Signed)
Victoria [name] [phone]  Shaylee called to say for a couple of days, she has had Headache, sore throat, achy, nasal congestion. Ears hurt mainly on right side, checks ache. She done home COVID test this morning that was negative.

## 2021-06-21 ENCOUNTER — Ambulatory Visit (INDEPENDENT_AMBULATORY_CARE_PROVIDER_SITE_OTHER): Payer: Medicare HMO | Admitting: Internal Medicine

## 2021-06-21 ENCOUNTER — Other Ambulatory Visit: Payer: Self-pay

## 2021-06-21 VITALS — Temp 99.0°F

## 2021-06-21 DIAGNOSIS — J302 Other seasonal allergic rhinitis: Secondary | ICD-10-CM | POA: Diagnosis not present

## 2021-06-21 DIAGNOSIS — F411 Generalized anxiety disorder: Secondary | ICD-10-CM

## 2021-06-21 DIAGNOSIS — J0101 Acute recurrent maxillary sinusitis: Secondary | ICD-10-CM

## 2021-06-21 DIAGNOSIS — Z853 Personal history of malignant neoplasm of breast: Secondary | ICD-10-CM

## 2021-06-21 DIAGNOSIS — E119 Type 2 diabetes mellitus without complications: Secondary | ICD-10-CM

## 2021-06-21 DIAGNOSIS — R0981 Nasal congestion: Secondary | ICD-10-CM | POA: Diagnosis not present

## 2021-06-21 DIAGNOSIS — J029 Acute pharyngitis, unspecified: Secondary | ICD-10-CM

## 2021-06-21 DIAGNOSIS — I10 Essential (primary) hypertension: Secondary | ICD-10-CM | POA: Diagnosis not present

## 2021-06-21 MED ORDER — METHYLPREDNISOLONE ACETATE 80 MG/ML IJ SUSP
80.0000 mg | Freq: Once | INTRAMUSCULAR | Status: AC
Start: 2021-06-21 — End: 2021-06-21
  Administered 2021-06-21: 80 mg via INTRAMUSCULAR

## 2021-06-21 MED ORDER — AMOXICILLIN-POT CLAVULANATE 875-125 MG PO TABS: 1.0000 | ORAL_TABLET | Freq: Two times a day (BID) | ORAL | 0 refills | Status: DC

## 2021-06-22 LAB — SARS-COV-2 RNA,(COVID-19) QUALITATIVE NAAT: SARS CoV2 RNA: NOT DETECTED

## 2021-06-24 ENCOUNTER — Encounter: Payer: Self-pay | Admitting: Internal Medicine

## 2021-06-24 NOTE — Patient Instructions (Addendum)
Augmentin 875 mg twice daily for 10 days.  Rest and drink fluids.  Monitor Accu-Cheks call if not improving.  COVID-19 PCR test obtained.  Also given Depo-Medrol 80 mg IM   Addendum: Results of PCR COVID-19 test are negative and patient informed on June 24, 2021 by phone.

## 2021-06-24 NOTE — Progress Notes (Signed)
   Subjective:    Patient ID: Victoria [name], female    DOB: Jul 13, 1941, 80 y.o.   MRN: [id number]  HPI 80 year old Female seen in person with headache, sore throat, nasal congestion. Has bilateral ear pain right greater than left. Home Covid test was negative today. Has had symptoms for about 2 days. Describes this as "my regular sinus infection". No known Covid-19 exposure. Hx of DM, HTN, History of breast cancer in the remote past, and anxiety.    Review of Systems see above. Not a lot of discolored drainage but pressure in maxillary sinus areas.sore sore throat in addition to nasal congestion and headache.     Objective:   Physical Exam Skin is warm and dry. No cervical adenopathy. TMs clear. Right TM slightly full. Neck supple Chest is clear to ascultation. Cor: RRR.  No lower extremity edema.       Assessment & Plan:   Acute maxillary sinusitis  History of diabetes mellitus  Essential hypertension  Remote history of breast cancer  History of anxiety  Plan: Augmentin 875 mg twice daily for 10 days.  Rest and drink fluids.  Monitor Accu-Cheks.  Call if not improving.  Also given Depo-Medrol 80 mg IM which patient says helped  previously relieve sinus pressure and congestion and she requests this today.  COVID-19 PCR test was obtained and results proved to be negative.

## 2021-06-26 ENCOUNTER — Encounter: Payer: Self-pay | Admitting: Internal Medicine

## 2021-06-26 ENCOUNTER — Telehealth: Payer: Self-pay | Admitting: Internal Medicine

## 2021-06-26 MED ORDER — MECLIZINE HCL 25 MG PO TABS: 25.0000 mg | ORAL_TABLET | Freq: Three times a day (TID) | ORAL | 99 refills | Status: DC | PRN

## 2021-06-26 NOTE — Telephone Encounter (Signed)
Called to tell patient her Covid test was negative. She is feeling better. Took Meclizine she says. Says she needs refill and this will be provided electronically. MJB,MD

## 2021-07-02 ENCOUNTER — Other Ambulatory Visit: Payer: Self-pay

## 2021-07-02 MED ORDER — HYDROCODONE BIT-HOMATROP MBR 5-1.5 MG/5ML PO SOLN: 5.0000 mL | Freq: Three times a day (TID) | ORAL | 0 refills | Status: DC | PRN

## 2021-07-02 NOTE — Telephone Encounter (Signed)
Patient called she said last time she was here you had offered a cough syrup and she declined it but now she would like a prescription for one.

## 2021-07-13 ENCOUNTER — Other Ambulatory Visit: Payer: Self-pay

## 2021-07-13 NOTE — Telephone Encounter (Signed)
Patient called she said she is going out of town of town and does not want to run out her HYCODAN syrup she said she is better but she would a refill.

## 2021-07-14 ENCOUNTER — Encounter: Payer: Self-pay | Admitting: Internal Medicine

## 2021-07-14 DIAGNOSIS — Z03818 Encounter for observation for suspected exposure to other biological agents ruled out: Secondary | ICD-10-CM | POA: Diagnosis not present

## 2021-07-14 DIAGNOSIS — Z20822 Contact with and (suspected) exposure to covid-19: Secondary | ICD-10-CM | POA: Diagnosis not present

## 2021-07-14 NOTE — Patient Instructions (Signed)
Patient is concerned about some thickening and discomfort below her umbilicus.  I think it may be scar tissue but we will refer her  to GI for evaluation.  She will continue with current medications and follow-up in 6 months here.  At that time she will be due for Medicare wellness and health maintenance exam with fasting labs.

## 2021-07-23 ENCOUNTER — Telehealth: Payer: Self-pay | Admitting: Internal Medicine

## 2021-07-23 NOTE — Telephone Encounter (Signed)
Called patient and let her know, she does not need COVID test until she starts having symptoms, she verbalized understanding.

## 2021-07-23 NOTE — Telephone Encounter (Signed)
Victoria [name] [phone]  Victoria [name] called to say her roommate has tested positive for COVID on Saturday night, she is worried, she is not have any symptoms at this time. However she is thinking she needs to be tested right away. The home test she has went out of date in July. She has no symptoms at this time.

## 2021-07-25 ENCOUNTER — Ambulatory Visit: Payer: Medicare HMO

## 2021-07-25 ENCOUNTER — Ambulatory Visit
Admission: RE | Admit: 2021-07-25 | Discharge: 2021-07-25 | Disposition: A | Discharge status: Home / Self Care | Payer: Medicare HMO | Source: Ambulatory Visit | Attending: Internal Medicine | Admitting: Internal Medicine

## 2021-07-25 ENCOUNTER — Other Ambulatory Visit: Payer: Self-pay

## 2021-07-25 DIAGNOSIS — Z1231 Encounter for screening mammogram for malignant neoplasm of breast: Secondary | ICD-10-CM | POA: Diagnosis not present

## 2021-07-30 ENCOUNTER — Other Ambulatory Visit: Payer: Self-pay | Admitting: Internal Medicine

## 2021-07-31 ENCOUNTER — Other Ambulatory Visit: Payer: Self-pay | Admitting: Internal Medicine

## 2021-07-31 DIAGNOSIS — R928 Other abnormal and inconclusive findings on diagnostic imaging of breast: Secondary | ICD-10-CM

## 2021-08-02 ENCOUNTER — Other Ambulatory Visit: Payer: Self-pay

## 2021-08-02 MED ORDER — ALPRAZOLAM 0.5 MG PO TABS: ORAL_TABLET | ORAL | 0 refills | Status: DC

## 2021-08-02 NOTE — Telephone Encounter (Signed)
Pharmacist from [facility] called states patient's Victoria [name] is running behind and patient is requesting a 7 day supply sent to [facility].

## 2021-10-16 ENCOUNTER — Other Ambulatory Visit: Payer: Self-pay

## 2021-10-16 ENCOUNTER — Encounter: Payer: Self-pay | Admitting: Internal Medicine

## 2021-10-16 ENCOUNTER — Ambulatory Visit (INDEPENDENT_AMBULATORY_CARE_PROVIDER_SITE_OTHER): Payer: Medicare HMO | Admitting: Internal Medicine

## 2021-10-16 VITALS — BP 116/72 | HR 108 | Temp 98.1°F | Ht 68.0 in | Wt 210.0 lb

## 2021-10-16 DIAGNOSIS — N6012 Diffuse cystic mastopathy of left breast: Secondary | ICD-10-CM

## 2021-10-16 DIAGNOSIS — N6011 Diffuse cystic mastopathy of right breast: Secondary | ICD-10-CM

## 2021-10-16 DIAGNOSIS — Z853 Personal history of malignant neoplasm of breast: Secondary | ICD-10-CM | POA: Diagnosis not present

## 2021-10-16 DIAGNOSIS — J302 Other seasonal allergic rhinitis: Secondary | ICD-10-CM | POA: Diagnosis not present

## 2021-10-16 MED ORDER — HYDROCODONE BIT-HOMATROP MBR 5-1.5 MG/5ML PO SOLN: 5.0000 mL | Freq: Three times a day (TID) | ORAL | 0 refills | Status: DC | PRN

## 2021-10-16 NOTE — Patient Instructions (Addendum)
Hycodan refilled for cough and allergic rhinitis at patient request.  She will have diagnostic mammogram.  She for will return  in December for Medicare wellness exam.

## 2021-10-16 NOTE — Progress Notes (Signed)
   Subjective:    Patient ID: Victoria [name], female    DOB: 08/17/1941, 80 y.o.   MRN: [id number]  HPI 80 year old Female seen for follow up at my request regarding abnormal screening mammogram. Has been scheduled for diagnostic mammogram.  Also has allergic rhinitis symptoms for which Hycodan syrup. Helps. She takes it sparingly for cough and feels that it helps with rhinitis as well.  She has a remote history of breast cancer.  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  There was a reexcision in September 2000 showing a single microscopic focus of intraductal carcinoma.  She had radiation therapy.  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  10 lymph nodes were negative for tumor.  Tumor was ER and PR positive.  She was referred to radiation oncology for further radiation of remaining right breast tissue.  She had been on estrogen replacement since 1997 at the time of her diagnosis.  There is no family history of breast cancer.  Tamoxifen was recommended but she declined to take it.  Generally is asked to return for diagnostic mammogram after screening mammogram she says.  Review of Systems see above     Objective:   Physical Exam Vital signs reviewed  Right breast she has significant mass about 1 cm at the 11:00 that is quite hard.  She has fibrocystic changes throughout the right breast.  Similar findings in left breast.       Assessment & Plan:  Abnormal mammogram right breast-dense and diagnostic mammogram.  History of right breast cancer.  Fibrocystic breast disease  Allergic rhinitis  Plan: Refill Hycodan at her request for allergic rhinitis symptoms and cough.  She agrees to have diagnostic mammogram.  I will see her for health maintenance exam and Medicare wellness  visit in December.

## 2021-10-16 NOTE — Progress Notes (Signed)
   Subjective:    Patient ID: Victoria [name], female    DOB: Oct 16, 1941, 80 y.o.   MRN: [id number]  HPI    Review of Systems     Objective:   Physical Exam        Assessment & Plan:

## 2021-11-06 ENCOUNTER — Other Ambulatory Visit: Payer: Self-pay | Admitting: Internal Medicine

## 2021-11-07 ENCOUNTER — Other Ambulatory Visit: Payer: Self-pay | Admitting: Internal Medicine

## 2021-11-09 ENCOUNTER — Encounter: Payer: Self-pay | Admitting: Internal Medicine

## 2021-11-09 ENCOUNTER — Telehealth (INDEPENDENT_AMBULATORY_CARE_PROVIDER_SITE_OTHER): Payer: Medicare HMO | Admitting: Internal Medicine

## 2021-11-09 DIAGNOSIS — R7611 Nonspecific reaction to tuberculin skin test without active tuberculosis: Secondary | ICD-10-CM

## 2021-11-09 NOTE — Telephone Encounter (Signed)
Patient called today asking for order for CXR as she is returning to work at [employer] and says she has Hx of positive PPD so TB skin test is not an option. I spoke with patient about this in detail. Says no form is required for me to sign off on just a CXR needed. Will place Order for CXR PA and lateral.  I connected with patient today by telephone and verified using 2 identifiers I am speaking with Victoria [name], a patient in this practice with date of birth Mar 14, 1941.  She is agreeable to speaking with me.  She is at her home and I am at my office.  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  Patient last had chest x-ray on review of records and emergency department October 2021.  She says chest x-ray has to be within the year.  Is anyone needs to be ordered.  Currently has no symptoms of TB with cough, fever, weight loss.  Order will be placed for chest x-ray PA and lateral and she can call for an appointment to have this x-ray performed.  Results will be sent to me and we can contact patient with these results and they will also be available on MyChart.  Time spent with this call  is 15 minutes.

## 2021-11-12 ENCOUNTER — Other Ambulatory Visit: Payer: Self-pay

## 2021-11-12 ENCOUNTER — Ambulatory Visit
Admission: RE | Admit: 2021-11-12 | Discharge: 2021-11-12 | Disposition: A | Discharge status: Home / Self Care | Payer: Medicare HMO | Source: Ambulatory Visit | Attending: Internal Medicine | Admitting: Internal Medicine

## 2021-11-12 DIAGNOSIS — R7611 Nonspecific reaction to tuberculin skin test without active tuberculosis: Secondary | ICD-10-CM | POA: Diagnosis not present

## 2021-11-12 DIAGNOSIS — I700 Atherosclerosis of aorta: Secondary | ICD-10-CM | POA: Diagnosis not present

## 2021-11-20 ENCOUNTER — Ambulatory Visit: Payer: Medicare HMO | Admitting: Gastroenterology

## 2021-11-20 ENCOUNTER — Encounter: Payer: Self-pay | Admitting: Gastroenterology

## 2021-11-20 ENCOUNTER — Other Ambulatory Visit: Payer: Medicare HMO

## 2021-11-20 VITALS — BP 122/90 | HR 80 | Ht 68.0 in | Wt 205.0 lb

## 2021-11-20 DIAGNOSIS — R1033 Periumbilical pain: Secondary | ICD-10-CM

## 2021-11-20 DIAGNOSIS — K7460 Unspecified cirrhosis of liver: Secondary | ICD-10-CM

## 2021-11-20 DIAGNOSIS — K439 Ventral hernia without obstruction or gangrene: Secondary | ICD-10-CM | POA: Diagnosis not present

## 2021-11-20 DIAGNOSIS — B182 Chronic viral hepatitis C: Secondary | ICD-10-CM | POA: Diagnosis not present

## 2021-11-20 LAB — BUN/CREATININE RATIO
BUN/Creatinine Ratio: 18 (calc) (ref 6–22)
BUN: 22 mg/dL (ref 7–25)
Creat: 1.22 mg/dL — ABNORMAL HIGH (ref 0.60–1.00)
eGFR: 45 mL/min/{1.73_m2} — ABNORMAL LOW (ref >=60)

## 2021-11-20 NOTE — Patient Instructions (Addendum)
You have been scheduled for a CT scan of the abdomen and pelvis at [facility], 1st floor Radiology. You are scheduled on 11/29/2021  at 12:00pm. You should arrive 15 minutes prior to your appointment time for registration.  Please pick up 2 bottles of contrast from [facility] at least 3 days prior to your scan. The solution may taste better if refrigerated, but do NOT add ice or any other liquid to this solution. Shake well before drinking.   Please follow the written instructions below on the day of your exam:   1) Do not eat anything after 8am (4 hours prior to your test)   2) Drink 1 bottle of contrast @ 10am (2 hours prior to your exam)  Remember to shake well before drinking and do NOT pour over ice.     Drink 1 bottle of contrast @ 11am (1 hour prior to your exam)   You may take any medications as prescribed with a small amount of water, if necessary. If you take any of the following medications: METFORMIN, GLUCOPHAGE, GLUCOVANCE, AVANDAMET, RIOMET, FORTAMET, [place] MET, JANUMET, GLUMETZA or METAGLIP, you MAY be asked to HOLD this medication 48 hours AFTER the exam.   The purpose of you drinking the oral contrast is to aid in the visualization of your intestinal tract. The contrast solution may cause some diarrhea. Depending on your individual set of symptoms, you may also receive an intravenous injection of x-ray contrast/dye. Plan on being at [facility] for 45 minutes or longer, depending on the type of exam you are having performed.   If you have any questions regarding your exam or if you need to reschedule, you may call [facility] at [phone] between the hours of 8:00 am and 5:00 pm, Monday-Friday.   Your provider has requested that you go to the basement level for lab work before leaving today. Press "B" on the elevator. The lab is located at the first door on the left as you exit the elevator.     Due to recent changes in healthcare laws, you may see the  results of your imaging and laboratory studies on MyChart before your provider has had a chance to review them.  We understand that in some cases there may be results that are confusing or concerning to you. Not all laboratory results come back in the same time frame and the provider may be waiting for multiple results in order to interpret others.  Please give [place] 48 hours in order for your provider to thoroughly review all the results before contacting the office for clarification of your results.    Thank you for choosing [facility]  Karleen Hampshire Nandigam,MD

## 2021-11-20 NOTE — Progress Notes (Signed)
Victoria [name]    [id number]    Jan 17, 1941  Primary Care Physician:Baxley, Cresenciano Lick, MD  Referring Physician: Elby Showers, MD [address] [place],  [place] [address]   Chief complaint:  Abdominal pain  HPI: 80 yr very pleasant female here with c/o abdominal pain below the bellybutton.  Intermittently she feels a bulge with worsening discomfort and is usually after when the bulge goes down.  She also feels worse when she is constipated.  No relationship with diet or activity.  She does wear supportive back brace when she is walking or doing any exercise.  Colonoscopy July 19, 2019: - Preparation of the colon was fair. - Severe diverticulosis in the sigmoid colon, in the descending colon, in the transverse colon, in the ascending colon and in the cecum. - Non-bleeding internal hemorrhoids. - The examination was otherwise normal.  MRI abdomen with and without contrast November 2015 showed nodular contour liver suggestive of cirrhosis.  Status postcholecystectomy.  Multifocal sidebranch ectasia of pancreatic duct and multiple diminutive pancreatic cyst in the body and tail of pancreas.  Multiple renal cysts.  Outpatient Encounter Medications as of 11/20/2021  Medication Sig   ALPRAZolam (XANAX) 0.5 MG tablet TAKE 1 TABLET(0.5 MG) BY MOUTH TWICE DAILY AS NEEDED FOR ANXIETY   amLODipine (NORVASC) 2.5 MG tablet TAKE 1 TABLET(2.5 MG) BY MOUTH AT BEDTIME (Patient taking differently: at bedtime. TAKE 1 TABLET(2.5 MG) BY MOUTH AT BEDTIME)   Blood Glucose Monitoring Suppl (TRUE METRIX METER) w/Device KIT USE AS DIRECTED   Cetirizine HCl (ZYRTEC ALLERGY PO) Take 1 tablet by mouth daily as needed (allergies).    cholecalciferol (VITAMIN D3) 25 MCG (1000 UNIT) tablet Take 1,000 Units by mouth daily.   ELDERBERRY PO Take 1 tablet by mouth daily.   gabapentin (NEURONTIN) 300 MG capsule Take 1 capsule (300 mg total) by mouth 3 (three) times daily for 2 days.   glucose blood  (FREESTYLE TEST STRIPS) test strip CHECK GLUCOSE FOUR TIMES A DAY   HYDROcodone bit-homatropine (HYCODAN) 5-1.5 MG/5ML syrup Take 5 mLs by mouth every 8 (eight) hours as needed for cough.   ibuprofen (ADVIL) 100 MG tablet Take 100 mg by mouth every 6 (six) hours as needed for pain or fever.   ibuprofen (ADVIL) 800 MG tablet Take 1 tablet (800 mg total) by mouth every 8 (eight) hours as needed for moderate pain.   Lancets (FREESTYLE) lancets CHECK GLUCOSE TWICE A DAY   losartan-hydrochlorothiazide (HYZAAR) 100-25 MG tablet TAKE 1 TABLET BY MOUTH DAILY   meclizine (ANTIVERT) 25 MG tablet Take 1 tablet (25 mg total) by mouth 3 (three) times daily as needed for dizziness.   metFORMIN (GLUCOPHAGE) 500 MG tablet TAKE 1 TABLET(500 MG) BY MOUTH TWICE DAILY WITH A MEAL   oxyCODONE (OXY IR/ROXICODONE) 5 MG immediate release tablet Take 1 tablet (5 mg total) by mouth every 4 (four) hours as needed for severe pain.   No facility-administered encounter medications on file as of 11/20/2021.    Allergies as of 11/20/2021 - Review Complete 11/09/2021  Allergen Reaction Noted   Lisinopril Cough 10/01/2011    Past Medical History:  Diagnosis Date   Abnormal finding on Pap smear    Allergy    SEASONAL   Anxiety    Breast cancer ([place]) 2000   right breast lumptectomy, radiation done   Cirrhosis ([place]) 11/2012   resolved per pt   Diabetes mellitus type 2, controlled ([place])  Hepatitis C    took tx for    Hypertension    Obesity    Thrombocytopenia ([place]) 11-2012    Past Surgical History:  Procedure Laterality Date   ANTERIOR AND POSTERIOR REPAIR N/A 02/28/2021   Procedure: ANTERIOR (CYSTOCELE) AND POSTERIOR REPAIR (RECTOCELE);  Surgeon: Cheri Fowler, MD;  Location: [facility];  Service: Gynecology;  Laterality: N/A;   BREAST BIOPSY Left 09/09/2016   BREAST LUMPECTOMY  right   BREAST LUMPECTOMY     BREAST REDUCTION SURGERY  2005   CATARACT EXTRACTION, BILATERAL  2014    CHOLECYSTECTOMY N/A 03/30/2013   Procedure: LAPAROSCOPIC CHOLECYSTECTOMY WITH INTRAOPERATIVE CHOLANGIOGRAM;  Surgeon: Shann Medal, MD;  Location: [facility];  Service: General;  Laterality: N/A;   COLONOSCOPY  2021   CYSTOSCOPY N/A 02/28/2021   Procedure: Consuela Mimes;  Surgeon: Cheri Fowler, MD;  Location: [facility];  Service: Gynecology;  Laterality: N/A;   CYSTOSCOPY W/ RETROGRADES Left 02/28/2021   Procedure: CYSTOSCOPY WITH RETROGRADE PYELOGRAM;  Surgeon: Cheri Fowler, MD;  Location: [place];  Service: Gynecology;  Laterality: Left;   EUS  01/28/2013   Procedure: UPPER ENDOSCOPIC ULTRASOUND (EUS) LINEAR;  Surgeon: Milus Banister, MD;  Location: [facility];  Service: Endoscopy;  Laterality: N/A;  ride-adrian  Baillargeon [phone]   EXCISION OF SKIN TAG Right 03/30/2013   Procedure: EXCISION OF SKIN TAG;  Surgeon: Shann Medal, MD;  Location: [facility];  Service: General;  Laterality: Right;   LIVER BIOPSY  03/30/2013   Procedure: LIVER BIOPSY;  Surgeon: Shann Medal, MD;  Location: [facility];  Service: General;;   POLYPECTOMY     REDUCTION MAMMAPLASTY     THYROIDECTOMY, PARTIAL  1972   left   VAGINAL HYSTERECTOMY N/A 02/28/2021   Procedure: HYSTERECTOMY VAGINAL WITH UNILATERAL SALPINGECTOMY;  Surgeon: Cheri Fowler, MD;  Location: [facility];  Service: Gynecology;  Laterality: N/A;    Family History  Problem Relation Age of Onset   Diabetes Mother    Stroke Mother    Other Sister    Colon cancer Sister 36   Colon polyps Neg Hx    Esophageal cancer Neg Hx    Rectal cancer Neg Hx    Stomach cancer Neg Hx    Pancreatic cancer Neg Hx    Prostate cancer Neg Hx    Breast cancer Neg Hx     Social History   Socioeconomic History   Marital status: Divorced    Spouse name: Not on file   Number of children: 3   Years of education: Not on file   Highest education level: Not on file  Occupational History   Not on file   Tobacco Use   Smoking status: Never   Smokeless tobacco: Never  Vaping Use   Vaping Use: Never used  Substance and Sexual Activity   Alcohol use: No   Drug use: No   Sexual activity: Not on file  Other Topics Concern   Not on file  Social History Narrative   Lives alone   Caffeine use: none   Right handed    Social Determinants of Health   Financial Resource Strain: Not on file  Food Insecurity: Not on file  Transportation Needs: Not on file  Physical Activity: Not on file  Stress: Not on file  Social Connections: Not on file  Intimate Partner Violence: Not on file      Review of systems: All other review of systems negative except  as mentioned in the HPI.   Physical Exam: Vitals:   11/20/21 1352  BP: 122/90  Pulse: 80  SpO2: 99%   Body mass index is 31.17 kg/m. Gen:      No acute distress HEENT:  sclera anicteric Abd:      soft, non-tender; no palpable masses, no distension.  Small periumbilical hernia, reducible Ext:    No edema Neuro: alert and oriented x 3 Psych: normal mood and affect  Data Reviewed:  Reviewed labs, radiology imaging, old records and pertinent past GI work up   Assessment and Plan/Recommendations:  80 year old very pleasant female with history of breast cancer, s/p cholecystectomy, hysterectomy with periumbilical abdominal pain associated with periumbilical hernia, that is reducible Will obtain CT abdomen pelvis to exclude narrow neck for the peri-umbilical hernia and to assess if there is any risk for strangulation or obstruction. Advised patient to avoid excessive straining or lifting heavy weights and to continue to use support brace with activity If has no evidence of bowel obstruction and the periumbilical hernia continues to be reducible, okay to monitor with supportive care and hold off surgical repair  History of hep C cirrhosis  No evidence of decompensation.  She is s/p Harvoni for hep C with SVR No recent imaging for [facility]  screening.  Plan to proceed with CT abdomen and pelvis She also has multiple stable benign-appearing pancreatic cysts noted on MRI in 2015   This visit required 42 minutes of patient care (this includes precharting, chart review, review of results, face-to-face time used for counseling as well as treatment plan and follow-up. The patient was provided an opportunity to ask questions and all were answered. The patient agreed with the plan and demonstrated an understanding of the instructions.  Damaris Hippo , MD    CC: Elby Showers, MD

## 2021-11-29 ENCOUNTER — Ambulatory Visit (HOSPITAL_COMMUNITY): Payer: Medicare HMO

## 2021-11-30 ENCOUNTER — Other Ambulatory Visit: Payer: Self-pay

## 2021-11-30 ENCOUNTER — Other Ambulatory Visit: Payer: Medicare HMO | Admitting: Internal Medicine

## 2021-11-30 DIAGNOSIS — E890 Postprocedural hypothyroidism: Secondary | ICD-10-CM

## 2021-11-30 DIAGNOSIS — I10 Essential (primary) hypertension: Secondary | ICD-10-CM

## 2021-11-30 DIAGNOSIS — Z13220 Encounter for screening for lipoid disorders: Secondary | ICD-10-CM

## 2021-11-30 DIAGNOSIS — E119 Type 2 diabetes mellitus without complications: Secondary | ICD-10-CM

## 2021-11-30 NOTE — Addendum Note (Signed)
Addended by: Angus Seller on: 11/30/2021 09:20 AM   Modules accepted: Orders

## 2021-11-30 NOTE — Progress Notes (Signed)
Patient ate, rescheduled labs. All labs cancelled and will be reordered.

## 2021-12-03 ENCOUNTER — Encounter: Payer: Medicare HMO | Admitting: Internal Medicine

## 2021-12-03 ENCOUNTER — Ambulatory Visit (INDEPENDENT_AMBULATORY_CARE_PROVIDER_SITE_OTHER): Payer: Medicare HMO

## 2021-12-03 DIAGNOSIS — Z0000 Encounter for general adult medical examination without abnormal findings: Secondary | ICD-10-CM

## 2021-12-03 DIAGNOSIS — E119 Type 2 diabetes mellitus without complications: Secondary | ICD-10-CM | POA: Diagnosis not present

## 2021-12-03 MED ORDER — LOSARTAN POTASSIUM-HCTZ 100-25 MG PO TABS: 1.0000 | ORAL_TABLET | Freq: Every day | ORAL | 3 refills | Status: DC

## 2021-12-03 NOTE — Progress Notes (Addendum)
I connected with  Victoria [name] on 12/03/21 by a audio enabled telemedicine application and verified that I am speaking with the correct person using two identifiers. The purpose of the call is to conduct patient's Annual Medicare Wellness Visit.  IMargaree Mackintosh, MD, have reviewed all documentation for this visit. The documentation on 12/03/21 for the exam, diagnosis, procedures, and orders are all accurate and complete.   Patient Location: Home  Provider Location: Office/Clinic  I discussed the limitations of evaluation and management by telemedicine. The patient expressed understanding and agreed to proceed.  Subjective:   Victoria [name] is a 80 y.o. female who presents for Medicare Annual (Subsequent) preventive examination.  Review of Systems    Defer to PCP Cardiac Risk Factors include: advanced age (>57men, >68 women);diabetes mellitus;dyslipidemia;sedentary lifestyle     Objective:    There were no vitals filed for this visit. There is no height or weight on file to calculate BMI.  Advanced Directives 12/03/2021 02/28/2021 11/25/2020 11/24/2020 10/16/2020 12/12/2017 09/12/2014  Does Patient Have a Medical Advance Directive? Yes Yes No No No No No  Type of Estate agent of [place];Living will - - - - - -  Does patient want to make changes to medical advance directive? No - Patient declined No - Patient declined - - - - -  Copy of Healthcare Power of Attorney in Chart? No - copy requested - - - - - -  Would patient like information on creating a medical advance directive? - - No - Patient declined - No - Patient declined - -  Pre-existing out of facility DNR order (yellow form or pink MOST form) - - - - - - -    Current Medications (verified) Outpatient Encounter Medications as of 12/03/2021  Medication Sig   ALPRAZolam (XANAX) 0.5 MG tablet TAKE 1 TABLET(0.5 MG) BY MOUTH TWICE DAILY AS NEEDED FOR ANXIETY   amLODipine (NORVASC) 2.5 MG tablet TAKE 1  TABLET(2.5 MG) BY MOUTH AT BEDTIME (Patient taking differently: at bedtime. TAKE 1 TABLET(2.5 MG) BY MOUTH AT BEDTIME)   Blood Glucose Monitoring Suppl (TRUE METRIX METER) w/Device KIT USE AS DIRECTED   Cetirizine HCl (ZYRTEC ALLERGY PO) Take 1 tablet by mouth daily as needed (allergies).    cholecalciferol (VITAMIN D3) 25 MCG (1000 UNIT) tablet Take 1,000 Units by mouth daily.   glucose blood (FREESTYLE TEST STRIPS) test strip CHECK GLUCOSE FOUR TIMES A DAY   ibuprofen (ADVIL) 100 MG tablet Take 100 mg by mouth every 6 (six) hours as needed for pain or fever.   Lancets (FREESTYLE) lancets CHECK GLUCOSE TWICE A DAY   losartan-hydrochlorothiazide (HYZAAR) 100-25 MG tablet TAKE 1 TABLET BY MOUTH DAILY   metFORMIN (GLUCOPHAGE) 500 MG tablet TAKE 1 TABLET(500 MG) BY MOUTH TWICE DAILY WITH A MEAL   [DISCONTINUED] ibuprofen (ADVIL) 800 MG tablet Take 1 tablet (800 mg total) by mouth every 8 (eight) hours as needed for moderate pain.   No facility-administered encounter medications on file as of 12/03/2021.    Allergies (verified) Lisinopril   History: Past Medical History:  Diagnosis Date   Abnormal finding on Pap smear    Allergy    SEASONAL   Anxiety    Breast cancer (HCC) 2000   right breast lumptectomy, radiation done   Cirrhosis (HCC) 11/2012   resolved per pt   Diabetes mellitus type 2, controlled (HCC)    Hepatitis C    took tx for    Hypertension  Obesity    Thrombocytopenia ([place]) 11-2012   Past Surgical History:  Procedure Laterality Date   ANTERIOR AND POSTERIOR REPAIR N/A 02/28/2021   Procedure: ANTERIOR (CYSTOCELE) AND POSTERIOR REPAIR (RECTOCELE);  Surgeon: Cheri Fowler, MD;  Location: [facility];  Service: Gynecology;  Laterality: N/A;   BREAST BIOPSY Left 09/09/2016   BREAST LUMPECTOMY  right   BREAST LUMPECTOMY     BREAST REDUCTION SURGERY  2005   CATARACT EXTRACTION, BILATERAL  2014   CHOLECYSTECTOMY N/A 03/30/2013   Procedure: LAPAROSCOPIC  CHOLECYSTECTOMY WITH INTRAOPERATIVE CHOLANGIOGRAM;  Surgeon: Shann Medal, MD;  Location: [facility];  Service: General;  Laterality: N/A;   COLONOSCOPY  2021   CYSTOSCOPY N/A 02/28/2021   Procedure: Consuela Mimes;  Surgeon: Cheri Fowler, MD;  Location: [facility];  Service: Gynecology;  Laterality: N/A;   CYSTOSCOPY W/ RETROGRADES Left 02/28/2021   Procedure: CYSTOSCOPY WITH RETROGRADE PYELOGRAM;  Surgeon: Cheri Fowler, MD;  Location: [facility];  Service: Gynecology;  Laterality: Left;   EUS  01/28/2013   Procedure: UPPER ENDOSCOPIC ULTRASOUND (EUS) LINEAR;  Surgeon: Milus Banister, MD;  Location: [facility];  Service: Endoscopy;  Laterality: N/A;  ride-adrian  Krahl [phone]   EXCISION OF SKIN TAG Right 03/30/2013   Procedure: EXCISION OF SKIN TAG;  Surgeon: Shann Medal, MD;  Location: [facility];  Service: General;  Laterality: Right;   LIVER BIOPSY  03/30/2013   Procedure: LIVER BIOPSY;  Surgeon: Shann Medal, MD;  Location: [facility];  Service: General;;   POLYPECTOMY     REDUCTION MAMMAPLASTY     THYROIDECTOMY, PARTIAL  1972   left   VAGINAL HYSTERECTOMY N/A 02/28/2021   Procedure: HYSTERECTOMY VAGINAL WITH UNILATERAL SALPINGECTOMY;  Surgeon: Cheri Fowler, MD;  Location: [place];  Service: Gynecology;  Laterality: N/A;   Family History  Problem Relation Age of Onset   Diabetes Mother    Stroke Mother    Colon cancer Sister 10   Colon polyps Neg Hx    Esophageal cancer Neg Hx    Rectal cancer Neg Hx    Stomach cancer Neg Hx    Pancreatic cancer Neg Hx    Prostate cancer Neg Hx    Breast cancer Neg Hx    Social History   Socioeconomic History   Marital status: Divorced    Spouse name: Not on file   Number of children: 3   Years of education: Not on file   Highest education level: Not on file  Occupational History   Not on file  Tobacco Use   Smoking status: Never   Smokeless tobacco: Never  Vaping Use    Vaping Use: Never used  Substance and Sexual Activity   Alcohol use: No   Drug use: No   Sexual activity: Not on file  Other Topics Concern   Not on file  Social History Narrative   Lives alone   Caffeine use: none   Right handed    Social Determinants of Health   Financial Resource Strain: Medium Risk   Difficulty of Paying Living Expenses: Somewhat hard  Food Insecurity: No Food Insecurity   Worried About Running Out of Food in the Last Year: Never true   Ran Out of Food in the Last Year: Never true  Transportation Needs: No Transportation Needs   Lack of Transportation (Medical): No   Lack of Transportation (Non-Medical): No  Physical Activity: Sufficiently Active   Days of Exercise per Week: 4 days  Minutes of Exercise per Session: 60 min  Stress: No Stress Concern Present   Feeling of Stress : Only a little  Social Connections: Moderately Isolated   Frequency of Communication with Friends and Family: More than three times a week   Frequency of Social Gatherings with Friends and Family: More than three times a week   Attends Religious Services: Never   Database administrator or Organizations: Yes   Attends Banker Meetings: Never   Marital Status: Divorced    Tobacco Counseling Counseling given: Not Answered   Clinical Intake:  Pre-visit preparation completed: Yes  Pain : No/denies pain     BMI - recorded: 31.8 Nutritional Status: BMI > 30  Obese Nutritional Risks: None Diabetes: Yes CBG done?: Yes CBG resulted in Enter/ Edit results?: No Did pt. bring in CBG monitor from home?: No  How often do you need to have someone help you when you read instructions, pamphlets, or other written materials from your doctor or pharmacy?: 1 - Never What is the last grade level you completed in school?: 1 year college  Diabetic?Nutrition Risk Assessment:  Has the patient had any N/V/D within the last 2 months?  No  Does the patient have any non-healing  wounds?  No  Has the patient had any unintentional weight loss or weight gain?  No   Diabetes:  Is the patient diabetic?  Yes  If diabetic, was a CBG obtained today?  Yes  Did the patient bring in their glucometer from home?  No  How often do you monitor your CBG's? Once twice daily.   Financial Strains and Diabetes Management:  Are you having any financial strains with the device, your supplies or your medication? No .  Does the patient want to be seen by Chronic Care Management for management of their diabetes?  No  Would the patient like to be referred to a Nutritionist or for Diabetic Management?  Yes   Diabetic Exams:  Diabetic Eye Exam: Completed 05/17/2021 Diabetic Foot Exam: Completed 06/05/2021    Interpreter Needed?: No      Activities of Daily Living In your present state of health, do you have any difficulty performing the following activities: 12/03/2021 06/05/2021  Hearing? N N  Vision? N N  Difficulty concentrating or making decisions? N N  Walking or climbing stairs? N N  Dressing or bathing? N N  Doing errands, shopping? N N  Preparing Food and eating ? N -  Using the Toilet? N -  In the past six months, have you accidently leaked urine? N -  Do you have problems with loss of bowel control? N -  Managing your Medications? N -  Managing your Finances? N -  Housekeeping or managing your Housekeeping? N -  Some recent data might be hidden    Patient Care Team: Baxley, Luanna Cole, MD as PCP - General (Internal Medicine) Louis Meckel, MD (Inactive) as Consulting Physician (Gastroenterology)  Indicate any recent Medical Services you may have received from other than [facility] providers in the past year (date may be approximate).     Assessment:   This is a routine wellness examination for Chaeli.  Hearing/Vision screen No results found.  Dietary issues and exercise activities discussed: Current Exercise Habits: The patient does not participate in regular  exercise at present, Exercise limited by: orthopedic condition(s)   Goals Addressed   None   Depression Screen [facility] 2/9 Scores 12/03/2021 06/05/2021 11/30/2020 11/30/2019 05/27/2019 11/24/2018 11/17/2017  PHQ - 2 Score 0 0 0 0 0 0 0  Exception Documentation - - - - - - -    Fall Risk Fall Risk  12/03/2021 06/05/2021 06/05/2021 06/01/2020 11/30/2019  Falls in the past year? 0 0 0 0 0  Number falls in past yr: 0 - 0 0 -  Injury with Fall? 1 - 0 0 -  Risk for fall due to : No Fall Risks - No Fall Risks Other (Comment) -  Follow up Falls evaluation completed - Falls evaluation completed Falls evaluation completed -    FALL RISK PREVENTION PERTAINING TO THE HOME:  Any stairs in or around the home? No  If so, are there any without handrails? No  Home free of loose throw rugs in walkways, pet beds, electrical cords, etc? No  Adequate lighting in your home to reduce risk of falls? Yes   ASSISTIVE DEVICES UTILIZED TO PREVENT FALLS:  Life alert? No  Use of a cane, walker or w/c? No  Grab bars in the bathroom? No  Shower chair or bench in shower? Yes  Elevated toilet seat or a handicapped toilet? No   TIMED UP AND GO:  Was the test performed? No .  Length of time to ambulate 10 feet: n/a sec.     Cognitive Function:     6CIT Screen 12/03/2021  What Year? 0 points  What month? 0 points  What time? 0 points  Count back from 20 0 points  Months in reverse 0 points  Repeat phrase 0 points  Total Score 0    Immunizations Immunization History  Administered Date(s) Administered   Influenza Whole 08/23/2010   Influenza,inj,Quad PF,6+ Mos 09/01/2019, 08/29/2020   Influenza-Unspecified 10/03/2021   Moderna SARS-COV2 Booster Vaccination 10/03/2021   Moderna Sars-Covid-2 Vaccination 09/25/2020, 04/10/2021   PFIZER(Purple Top)SARS-COV-2 Vaccination 01/11/2020, 01/31/2020   Pneumococcal Conjugate-13 11/17/2017   Pneumococcal Polysaccharide-23 06/22/2006, 11/24/2018   Tdap 01/24/2004     TDAP status: Due, Education has been provided regarding the importance of this vaccine. Advised may receive this vaccine at local pharmacy or Health Dept. Aware to provide a copy of the vaccination record if obtained from local pharmacy or Health Dept. Verbalized acceptance and understanding.  Flu Vaccine status: Up to date  Pneumococcal vaccine status: Up to date  Covid-19 vaccine status: Information provided on how to obtain vaccines.   Qualifies for Shingles Vaccine? Yes   Zostavax completed No   Shingrix Completed?: No.    Education has been provided regarding the importance of this vaccine. Patient has been advised to call insurance company to determine out of pocket expense if they have not yet received this vaccine. Advised may also receive vaccine at local pharmacy or Health Dept. Verbalized acceptance and understanding.  Screening Tests Health Maintenance  Topic Date Due   HEMOGLOBIN A1C  11/23/2021   COVID-19 Vaccine (5 - Booster for Pfizer series) 12/19/2021 (Originally 11/28/2021)   Zoster Vaccines- Shingrix (1 of 2) 03/03/2022 (Originally 07/14/1993)   TETANUS/TDAP  12/03/2022 (Originally 01/23/2014)   OPHTHALMOLOGY EXAM  05/17/2022   FOOT EXAM  06/05/2022   COLONOSCOPY (Pts 45-51yrs Insurance coverage will need to be confirmed)  07/18/2022   Pneumonia Vaccine 3+ Years old  Completed   INFLUENZA VACCINE  Completed   DEXA SCAN  Completed   Hepatitis C Screening  Completed   HPV VACCINES  Aged Out    Health Maintenance  Health Maintenance Due  Topic Date Due   HEMOGLOBIN A1C  11/23/2021  Colorectal cancer screening: Type of screening: Colonoscopy. Completed 7/27/202. Repeat every 10 years-No longer required due to age per GI.   Mammogram status: Completed 07/28/2021. Repeat every year  Bone Density status: Completed 02/13/2004. Results reflect: Bone density results: NORMAL. Repeat every 3-5 years.  Lung Cancer Screening: (Low Dose CT Chest recommended if Age  38-80 years, 30 pack-year currently smoking OR have quit w/in 15years.) does not qualify.   Lung Cancer Screening Referral: n/a  Additional Screening:  Hepatitis C Screening: does qualify; Completed 11/20/21  Vision Screening: Recommended annual ophthalmology exams for early detection of glaucoma and other disorders of the eye. Is the patient up to date with their annual eye exam?  Yes  Who is the provider or what is the name of the office in which the patient attends annual eye exams? [facility] If pt is not established with a provider, would they like to be referred to a provider to establish care?  N/a .   Dental Screening: Recommended annual dental exams for proper oral hygiene  Community Resource Referral / Chronic Care Management: CRR required this visit?  No   CCM required this visit?  No      Plan:     I have personally reviewed and noted the following in the patient's chart:   Medical and social history Use of alcohol, tobacco or illicit drugs  Current medications and supplements including opioid prescriptions.  Functional ability and status Nutritional status Physical activity Advanced directives List of other physicians Hospitalizations, surgeries, and ER visits in previous 12 months Vitals Screenings to include cognitive, depression, and falls Referrals and appointments  In addition, I have reviewed and discussed with patient certain preventive protocols, quality metrics, and best practice recommendations. A written personalized care plan for preventive services as well as general preventive health recommendations were provided to patient.     Angus Seller, CMA   12/03/2021   Nurse Notes: Non face to face minutes 25.  Ms. Fesperman , Thank you for taking time to come for your Medicare Wellness Visit. I appreciate your ongoing commitment to your health goals. Please review the following plan we discussed and let me know if I can assist you in the future.    These are the goals we discussed:  Goals   None     This is a list of the screening recommended for you and due dates:  Health Maintenance  Topic Date Due   Hemoglobin A1C  11/23/2021   COVID-19 Vaccine (5 - Booster for Pfizer series) 12/19/2021*   Zoster (Shingles) Vaccine (1 of 2) 03/03/2022*   Tetanus Vaccine  12/03/2022*   Eye exam for diabetics  05/17/2022   Complete foot exam   06/05/2022   Colon Cancer Screening  07/18/2022   Pneumonia Vaccine  Completed   Flu Shot  Completed   DEXA scan (bone density measurement)  Completed   Hepatitis C Screening: USPSTF Recommendation to screen - Ages 50-79 yo.  Completed   HPV Vaccine  Aged Out  *Topic was postponed. The date shown is not the original due date.

## 2021-12-06 ENCOUNTER — Other Ambulatory Visit: Payer: Medicare HMO | Admitting: Internal Medicine

## 2021-12-06 ENCOUNTER — Other Ambulatory Visit: Payer: Self-pay

## 2021-12-06 DIAGNOSIS — I10 Essential (primary) hypertension: Secondary | ICD-10-CM | POA: Diagnosis not present

## 2021-12-06 DIAGNOSIS — Z1329 Encounter for screening for other suspected endocrine disorder: Secondary | ICD-10-CM | POA: Diagnosis not present

## 2021-12-06 DIAGNOSIS — Z13220 Encounter for screening for lipoid disorders: Secondary | ICD-10-CM

## 2021-12-06 DIAGNOSIS — E119 Type 2 diabetes mellitus without complications: Secondary | ICD-10-CM

## 2021-12-07 LAB — COMPLETE METABOLIC PANEL WITH GFR
AG Ratio: 1.6 (calc) (ref 1.0–2.5)
ALT: 9 U/L (ref 6–29)
AST: 16 U/L (ref 10–35)
Albumin: 4.1 g/dL (ref 3.6–5.1)
Alkaline phosphatase (APISO): 49 U/L (ref 37–153)
BUN/Creatinine Ratio: 21 (calc) (ref 6–22)
BUN: 23 mg/dL (ref 7–25)
CO2: 28 mmol/L (ref 20–32)
Calcium: 9.7 mg/dL (ref 8.6–10.4)
Chloride: 101 mmol/L (ref 98–110)
Creat: 1.11 mg/dL — ABNORMAL HIGH (ref 0.60–1.00)
Globulin: 2.6 g/dL (calc) (ref 1.9–3.7)
Glucose, Bld: 113 mg/dL — ABNORMAL HIGH (ref 65–99)
Potassium: 4.4 mmol/L (ref 3.5–5.3)
Sodium: 139 mmol/L (ref 135–146)
Total Bilirubin: 0.6 mg/dL (ref 0.2–1.2)
Total Protein: 6.7 g/dL (ref 6.1–8.1)
eGFR: 51 mL/min/{1.73_m2} — ABNORMAL LOW (ref >=60)

## 2021-12-07 LAB — LIPID PANEL
Cholesterol: 140 mg/dL (ref <200)
HDL: 45 mg/dL — ABNORMAL LOW (ref >=50)
LDL Cholesterol (Calc): 80 mg/dL (calc)
Non-HDL Cholesterol (Calc): 95 mg/dL (calc) (ref <130)
Total CHOL/HDL Ratio: 3.1 (calc) (ref <5.0)
Triglycerides: 74 mg/dL (ref <150)

## 2021-12-07 LAB — CBC WITH DIFFERENTIAL/PLATELET
Absolute Monocytes: 355 cells/uL (ref 200–950)
Basophils Absolute: 20 cells/uL (ref 0–200)
Basophils Relative: 0.3 %
Eosinophils Absolute: 67 cells/uL (ref 15–500)
Eosinophils Relative: 1 %
HCT: 38.1 % (ref 35.0–45.0)
Hemoglobin: 12.5 g/dL (ref 11.7–15.5)
Lymphs Abs: 2854 cells/uL (ref 850–3900)
MCH: 27.7 pg (ref 27.0–33.0)
MCHC: 32.8 g/dL (ref 32.0–36.0)
MCV: 84.3 fL (ref 80.0–100.0)
MPV: 9.4 fL (ref 7.5–12.5)
Monocytes Relative: 5.3 %
Neutro Abs: 3404 cells/uL (ref 1500–7800)
Neutrophils Relative %: 50.8 %
Platelets: 245 10*3/uL (ref 140–400)
RBC: 4.52 10*6/uL (ref 3.80–5.10)
RDW: 14.9 % (ref 11.0–15.0)
Total Lymphocyte: 42.6 %
WBC: 6.7 10*3/uL (ref 3.8–10.8)

## 2021-12-07 LAB — MICROALBUMIN / CREATININE URINE RATIO
Creatinine, Urine: 163 mg/dL (ref 20–275)
Microalb Creat Ratio: 23 mcg/mg creat (ref <30)
Microalb, Ur: 3.7 mg/dL

## 2021-12-07 LAB — TSH: TSH: 0.5 mIU/L (ref 0.40–4.50)

## 2021-12-07 LAB — HEMOGLOBIN A1C
Hgb A1c MFr Bld: 6.3 % of total Hgb — ABNORMAL HIGH (ref <5.7)
Mean Plasma Glucose: 134 mg/dL
eAG (mmol/L): 7.4 mmol/L

## 2021-12-12 ENCOUNTER — Telehealth: Payer: Self-pay

## 2021-12-12 NOTE — Telephone Encounter (Signed)
Appt made

## 2021-12-28 ENCOUNTER — Other Ambulatory Visit: Payer: Self-pay | Admitting: Internal Medicine

## 2021-12-28 DIAGNOSIS — E119 Type 2 diabetes mellitus without complications: Secondary | ICD-10-CM

## 2022-01-21 ENCOUNTER — Other Ambulatory Visit: Payer: Self-pay

## 2022-01-21 ENCOUNTER — Emergency Department (HOSPITAL_COMMUNITY): Payer: Medicare HMO

## 2022-01-21 ENCOUNTER — Encounter (HOSPITAL_COMMUNITY): Payer: Self-pay | Admitting: Emergency Medicine

## 2022-01-21 ENCOUNTER — Emergency Department (HOSPITAL_COMMUNITY)
Admission: EM | Admit: 2022-01-21 | Discharge: 2022-01-22 | Disposition: A | Discharge status: Home / Self Care | Payer: Medicare HMO | Attending: Emergency Medicine | Admitting: Emergency Medicine

## 2022-01-21 DIAGNOSIS — I10 Essential (primary) hypertension: Secondary | ICD-10-CM | POA: Diagnosis not present

## 2022-01-21 DIAGNOSIS — E119 Type 2 diabetes mellitus without complications: Secondary | ICD-10-CM | POA: Diagnosis not present

## 2022-01-21 DIAGNOSIS — R0902 Hypoxemia: Secondary | ICD-10-CM | POA: Diagnosis not present

## 2022-01-21 DIAGNOSIS — Z853 Personal history of malignant neoplasm of breast: Secondary | ICD-10-CM | POA: Diagnosis not present

## 2022-01-21 DIAGNOSIS — J329 Chronic sinusitis, unspecified: Secondary | ICD-10-CM

## 2022-01-21 DIAGNOSIS — R42 Dizziness and giddiness: Secondary | ICD-10-CM | POA: Diagnosis not present

## 2022-01-21 DIAGNOSIS — J392 Other diseases of pharynx: Secondary | ICD-10-CM | POA: Diagnosis not present

## 2022-01-21 LAB — CBC
HCT: 38.4 % (ref 36.0–46.0)
Hemoglobin: 12.8 g/dL (ref 12.0–15.0)
MCH: 27.8 pg (ref 26.0–34.0)
MCHC: 33.3 g/dL (ref 30.0–36.0)
MCV: 83.3 fL (ref 80.0–100.0)
Platelets: 199 10*3/uL (ref 150–400)
RBC: 4.61 MIL/uL (ref 3.87–5.11)
RDW: 15.2 % (ref 11.5–15.5)
WBC: 7.8 10*3/uL (ref 4.0–10.5)
nRBC: 0 % (ref 0.0–0.2)

## 2022-01-21 LAB — BASIC METABOLIC PANEL
Anion gap: 14 (ref 5–15)
BUN: 16 mg/dL (ref 8–23)
CO2: 24 mmol/L (ref 22–32)
Calcium: 8.8 mg/dL — ABNORMAL LOW (ref 8.9–10.3)
Chloride: 101 mmol/L (ref 98–111)
Creatinine, Ser: 1.02 mg/dL — ABNORMAL HIGH (ref 0.44–1.00)
GFR, Estimated: 56 mL/min — ABNORMAL LOW (ref >60)
Glucose, Bld: 153 mg/dL — ABNORMAL HIGH (ref 70–99)
Potassium: 4.1 mmol/L (ref 3.5–5.1)
Sodium: 139 mmol/L (ref 135–145)

## 2022-01-21 MED ORDER — GUAIFENESIN 100 MG/5ML PO LIQD
5.0000 mL | Freq: Once | ORAL | Status: DC
  Filled 2022-01-21: qty 5

## 2022-01-21 NOTE — ED Provider Notes (Signed)
[facility] Emergency Department Provider Note MRN:  [id number]  Arrival date & time: 01/22/22     Chief Complaint   Dizziness   History of Present Illness   Victoria [name] is a 81 y.o. year-old female with a history of breast cancer, cirrhosis, diabetes, hepatitis, hypertension, thrombocytopenia presenting to the ED with chief complaint of dizziness/vertigo.  Patient presents to emergency department for evaluation of vertiginous symptoms.  States that she feels like the room is spinning around her.  Also states that she has having difficulty ambulating due to feeling unsteady.  Denies that she is having difficulty with her vision.  States that she has had no headaches, nausea or vomiting.  States that she has had no trauma to her head as well.  The patient reports that she did put ear drops into her right ear and feels like she might of put too much in there because her symptoms were exacerbated after this.  She did drain the eardrops from her ear but her symptoms have persisted so she came to the emergency department for evaluation.  Review of Systems  A thorough review of systems was obtained and all systems are negative except as noted in the HPI and PMH.   Patient's Health History    Past Medical History:  Diagnosis Date   Abnormal finding on Pap smear    Allergy    SEASONAL   Anxiety    Breast cancer ([place]) 2000   right breast lumptectomy, radiation done   Cirrhosis ([place]) 11/2012   resolved per pt   Diabetes mellitus type 2, controlled ([place])    Hepatitis C    took tx for    Hypertension    Obesity    Thrombocytopenia ([place]) 11-2012    Past Surgical History:  Procedure Laterality Date   ANTERIOR AND POSTERIOR REPAIR N/A 02/28/2021   Procedure: ANTERIOR (CYSTOCELE) AND POSTERIOR REPAIR (RECTOCELE);  Surgeon: Cheri Fowler, MD;  Location: [facility];  Service: Gynecology;  Laterality: N/A;   BREAST BIOPSY Left 09/09/2016    BREAST LUMPECTOMY  right   BREAST LUMPECTOMY     BREAST REDUCTION SURGERY  2005   CATARACT EXTRACTION, BILATERAL  2014   CHOLECYSTECTOMY N/A 03/30/2013   Procedure: LAPAROSCOPIC CHOLECYSTECTOMY WITH INTRAOPERATIVE CHOLANGIOGRAM;  Surgeon: Shann Medal, MD;  Location: [facility];  Service: General;  Laterality: N/A;   COLONOSCOPY  2021   CYSTOSCOPY N/A 02/28/2021   Procedure: Consuela Mimes;  Surgeon: Cheri Fowler, MD;  Location: [place];  Service: Gynecology;  Laterality: N/A;   CYSTOSCOPY W/ RETROGRADES Left 02/28/2021   Procedure: CYSTOSCOPY WITH RETROGRADE PYELOGRAM;  Surgeon: Cheri Fowler, MD;  Location: [place];  Service: Gynecology;  Laterality: Left;   EUS  01/28/2013   Procedure: UPPER ENDOSCOPIC ULTRASOUND (EUS) LINEAR;  Surgeon: Milus Banister, MD;  Location: [facility];  Service: Endoscopy;  Laterality: N/A;  ride-adrian  [place] [phone]   EXCISION OF SKIN TAG Right 03/30/2013   Procedure: EXCISION OF SKIN TAG;  Surgeon: Shann Medal, MD;  Location: [facility];  Service: General;  Laterality: Right;   LIVER BIOPSY  03/30/2013   Procedure: LIVER BIOPSY;  Surgeon: Shann Medal, MD;  Location: [facility];  Service: General;;   POLYPECTOMY     REDUCTION MAMMAPLASTY     THYROIDECTOMY, PARTIAL  1972   left   VAGINAL HYSTERECTOMY N/A 02/28/2021   Procedure: HYSTERECTOMY VAGINAL WITH UNILATERAL SALPINGECTOMY;  Surgeon: Cheri Fowler, MD;  Location:  [place];  Service: Gynecology;  Laterality: N/A;    Family History  Problem Relation Age of Onset   Diabetes Mother    Stroke Mother    Colon cancer Sister 83   Colon polyps Neg Hx    Esophageal cancer Neg Hx    Rectal cancer Neg Hx    Stomach cancer Neg Hx    Pancreatic cancer Neg Hx    Prostate cancer Neg Hx    Breast cancer Neg Hx     Social History   Socioeconomic History   Marital status: Divorced    Spouse name: Not on file   Number of children: 3   Years of  education: Not on file   Highest education level: Not on file  Occupational History   Not on file  Tobacco Use   Smoking status: Never   Smokeless tobacco: Never  Vaping Use   Vaping Use: Never used  Substance and Sexual Activity   Alcohol use: No   Drug use: No   Sexual activity: Not on file  Other Topics Concern   Not on file  Social History Narrative   Lives alone   Caffeine use: none   Right handed    Social Determinants of Health   Financial Resource Strain: Medium Risk   Difficulty of Paying Living Expenses: Somewhat hard  Food Insecurity: No Food Insecurity   Worried About Running Out of Food in the Last Year: Never true   Ran Out of Food in the Last Year: Never true  Transportation Needs: No Transportation Needs   Lack of Transportation (Medical): No   Lack of Transportation (Non-Medical): No  Physical Activity: Sufficiently Active   Days of Exercise per Week: 4 days   Minutes of Exercise per Session: 60 min  Stress: No Stress Concern Present   Feeling of Stress : Only a little  Social Connections: Moderately Isolated   Frequency of Communication with Friends and Family: More than three times a week   Frequency of Social Gatherings with Friends and Family: More than three times a week   Attends Religious Services: Never   Marine scientist or Organizations: Yes   Attends Archivist Meetings: Never   Marital Status: Divorced  Human resources officer Violence: Not At Risk   Fear of Current or Ex-Partner: No   Emotionally Abused: No   Physically Abused: No   Sexually Abused: No     Physical Exam   Physical Exam Constitutional:      Appearance: She is well-developed. She is not ill-appearing.  HENT:     Head: Normocephalic and atraumatic.     Right Ear: External ear normal.     Left Ear: External ear normal.     Nose: Nose normal.  Cardiovascular:     Rate and Rhythm: Normal rate and regular rhythm.     Pulses: Normal pulses.     Heart sounds:  Normal heart sounds.  Pulmonary:     Effort: Pulmonary effort is normal.     Breath sounds: Normal breath sounds.  Chest:     Chest wall: No tenderness.  Abdominal:     General: Abdomen is flat. Bowel sounds are normal. There is no distension.     Palpations: Abdomen is soft.     Tenderness: There is no abdominal tenderness.  Musculoskeletal:        General: Normal range of motion.     Cervical back: Neck supple.  Skin:    General:  Skin is warm and dry.  Neurological:     General: No focal deficit present.     Mental Status: She is alert and oriented to person, place, and time.     Cranial Nerves: No cranial nerve deficit.     Sensory: No sensory deficit.     Motor: No weakness.     Coordination: Coordination normal.     Gait: Gait normal.     Comments: Gross ataxia  Negative Dix-Hallpike.  Psychiatric:        Mood and Affect: Mood normal.      Diagnostic and Interventional Summary     Labs Reviewed  BASIC METABOLIC PANEL - Abnormal; Notable for the following components:      Result Value   Glucose, Bld 153 (*)    Creatinine, Ser 1.02 (*)    Calcium 8.8 (*)    GFR, Estimated 56 (*)    All other components within normal limits  CBC  URINALYSIS, ROUTINE W REFLEX MICROSCOPIC  CBG MONITORING, ED    CT Head Wo Contrast  Final Result    MR BRAIN WO CONTRAST    (Results Pending)    Medications  guaiFENesin (ROBITUSSIN) 100 MG/5ML liquid 5 mL (has no administration in time range)     Procedures  /  Critical Care Procedures  ED Course and Medical Decision Making  Initial Impression and Ddx 81 year old female presents to emergency department for evaluation of dizziness.  Differential diagnosis includes benign to the following: Peripheral causes such as BPPV or Mnire's disease or central causes ischemic episode.  Past medical/surgical history that increases complexity of ED encounter: Prior vascular risk factors as per above  Interpretation of Diagnostics I  personally reviewed the Cardiac Monitor and my interpretation is as follows: Normal sinus rhythm.  Head CT was obtained and was without acute intracranial abnormality.    Lab work was overall unremarkable.  Given high suspicion for ischemic stroke will obtain MRI.  Handoff to the oncoming attending was preformed at 1238 please see Dr. Gypsy Decant note for further details.     Zachery Dakins, MD 01/22/22 [id number]    Blanchie Dessert, MD 01/24/22 [phone]

## 2022-01-21 NOTE — ED Notes (Signed)
Pt refused vitals 

## 2022-01-21 NOTE — ED Notes (Signed)
Patient transported to MRI 

## 2022-01-21 NOTE — ED Triage Notes (Signed)
Patient states dizziness was greatly exacerbated today after instilling ear drops in bilateral ears.

## 2022-01-21 NOTE — ED Triage Notes (Signed)
Patient BIB [facility] from home for dizziness for the last three days ago. Patient states dizziness improves when she closes her eyes. Patient alert, oriented, and in no apparent distress at this time. No unilateral weakness, no ataxia, and is in no apparent distress at this time.  EMS vitals BP 135/86 CBG 190 HR 72 SpO2 96% on room air

## 2022-01-21 NOTE — ED Provider Triage Note (Signed)
Emergency Medicine Provider Triage Evaluation Note  Victoria [name] , a 81 y.o. female  was evaluated in triage.  Pt complains of dizziness for 3 days.  Patient states symptoms came on gradually, and have been worsening.  She tried putting eardrops in her ear today thinking that this make it better, but it made it significantly worse.  She is felt very lightheaded, and that she might pass out.  Review of Systems  Positive: Dizziness, lightheadedness, generalized weakness Negative: Fever, chills, syncope  Physical Exam  BP (!) 146/82    Pulse 71    Temp 98.2 F (36.8 C) (Oral)    Resp 16    SpO2 97%  Gen:   Awake, no distress   Resp:  Normal effort  MSK:   Moves extremities without difficulty  Other:  No slurred speech, no facial droop, 5/5 strength in all extremities, cranial nerves grossly intact  Medical Decision Making  Medically screening exam initiated at 2:14 PM.  Appropriate orders placed.  ANNLEIGH KNUEPPEL was informed that the remainder of the evaluation will be completed by another provider, this initial triage assessment does not replace that evaluation, and the importance of remaining in the ED until their evaluation is complete.     Annely Sliva T, PA-C 01/21/22 1414

## 2022-01-22 DIAGNOSIS — R42 Dizziness and giddiness: Secondary | ICD-10-CM | POA: Diagnosis not present

## 2022-01-22 LAB — URINALYSIS, ROUTINE W REFLEX MICROSCOPIC
Bilirubin Urine: NEGATIVE
Glucose, UA: NEGATIVE mg/dL
Hgb urine dipstick: NEGATIVE
Ketones, ur: NEGATIVE mg/dL
Nitrite: NEGATIVE
Protein, ur: NEGATIVE mg/dL
Specific Gravity, Urine: 1.015 (ref 1.005–1.030)
pH: 6 (ref 5.0–8.0)

## 2022-01-22 LAB — URINALYSIS, MICROSCOPIC (REFLEX)

## 2022-01-22 LAB — CBG MONITORING, ED: Glucose-Capillary: 92 mg/dL (ref 70–99)

## 2022-01-22 MED ORDER — HYDROCODONE BIT-HOMATROP MBR 5-1.5 MG/5ML PO SOLN
5.0000 mL | ORAL | Status: AC
  Administered 2022-01-22: 5 mL via ORAL
  Filled 2022-01-22: qty 5

## 2022-01-22 NOTE — ED Provider Notes (Signed)
Patient signed out to me with MRI pending.  Patient with nonspecific dizziness, MRI performed to rule out stroke.  MRI has been performed and is negative.  I did inform the patient of her MRI findings.  She tells me that she knew she did not have a stroke, she has dizziness secondary to chronic sinusitis that only gets better with Vicodin.  She is upset that she has been here this long, wants her blood sugar checked.  Sugar was normal.  Patient given Hycodan.  She does not require further work-up at this time.   Orpah Greek, MD 01/22/22 [phone]

## 2022-01-22 NOTE — ED Notes (Signed)
Ambulated to restroom w 1 assist. Somewhat unsteady gait.

## 2022-02-04 ENCOUNTER — Other Ambulatory Visit: Payer: Self-pay | Admitting: Internal Medicine

## 2022-02-04 ENCOUNTER — Telehealth: Payer: Self-pay | Admitting: Internal Medicine

## 2022-02-04 NOTE — Telephone Encounter (Signed)
Victoria [name] [phone]  [name] called to say she is having Congestion, face pain, mucus, drainage, cough,sore throat, she has not done a COVID test, she is going to do one and call me back.

## 2022-02-04 NOTE — Telephone Encounter (Signed)
LVM to CB and schedule appointment 

## 2022-02-05 ENCOUNTER — Other Ambulatory Visit: Payer: Self-pay

## 2022-02-05 ENCOUNTER — Encounter: Payer: Self-pay | Admitting: Internal Medicine

## 2022-02-05 ENCOUNTER — Ambulatory Visit (INDEPENDENT_AMBULATORY_CARE_PROVIDER_SITE_OTHER): Payer: Medicare HMO | Admitting: Internal Medicine

## 2022-02-05 VITALS — BP 130/90 | HR 83 | Temp 97.4°F

## 2022-02-05 DIAGNOSIS — J0101 Acute recurrent maxillary sinusitis: Secondary | ICD-10-CM

## 2022-02-05 MED ORDER — METHYLPREDNISOLONE ACETATE 80 MG/ML IJ SUSP
80.0000 mg | Freq: Once | INTRAMUSCULAR | Status: AC
  Administered 2022-02-05: 80 mg via INTRAMUSCULAR

## 2022-02-05 MED ORDER — AMOXICILLIN-POT CLAVULANATE 875-125 MG PO TABS: 1.0000 | ORAL_TABLET | Freq: Two times a day (BID) | ORAL | 1 refills | Status: DC

## 2022-02-05 NOTE — Telephone Encounter (Signed)
scheduled

## 2022-02-05 NOTE — Telephone Encounter (Signed)
COVID test neg

## 2022-02-05 NOTE — Progress Notes (Signed)
° °  Subjective:    Patient ID: Victoria [name], female    DOB: [birth year], 81 y.o.   MRN: [id number]  HPI 81 year old  seen for follow up of ED visit recently with vertigo. Hx chronic sinusitis. Seen 2021 by ENT and diagnosed with  sphenoid sinusitis Dr. Redmond Baseman removed nasal polyp 2021.  Occasionally gets vertigo.  She took ambulance to hospital with vertigo on January 30.  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  She is very excited about this new job.  With recent ED visit had MRI brain and CT of head.  MRI of the brain showed no acute intracranial process.  CT of head was negative except for mild generalized atrophy.  She was given 1 dose of Hycodan syrup.  She requested Hycodan in the emergency department.  Feels that she has a sinus infection.  Vertigo has resolved.  Has nasal congestion.  No fever or chills.  Slight cough.  Has had slight sore throat.    Review of Systems In July 2021     Objective:   Physical Exam Vital signs reviewed.  Blood pressure 130/90 pulse 83 temperature 97.4 degrees pulse oximetry 96%  Skin: Warm and dry.  Nodes none.  TMs are clear.  Neck is supple.  Chest clear.  No nystagmus on extraocular movement testing.  Brief neurological exam shows no focal deficits.  Affect thought and judgment are normal.       Assessment & Plan:   Acute maxillary sinusitis  History of vertigo in late January seen in the emergency department and resolved  Plan: Depo-Medrol 80 mg IM.  Augmentin 875/125 twice daily by mouth for 10 days.  Stay well-hydrated.  Call if not improving in 10 days to 2 weeks.  Was given 1 refill on Augmentin.

## 2022-02-05 NOTE — Patient Instructions (Signed)
Augmentin 875 mg twice daily for 10 days with 1 refill for maxillary sinusitis.  Have prescription refill to keep on hand for future episodes.  Depo-Medrol 80 mg IM given in office.  Return as needed.

## 2022-02-06 ENCOUNTER — Encounter: Payer: Self-pay | Admitting: Internal Medicine

## 2022-02-27 ENCOUNTER — Other Ambulatory Visit: Payer: Self-pay | Admitting: Internal Medicine

## 2022-04-22 DIAGNOSIS — H04123 Dry eye syndrome of bilateral lacrimal glands: Secondary | ICD-10-CM | POA: Diagnosis not present

## 2022-04-22 DIAGNOSIS — K7460 Unspecified cirrhosis of liver: Secondary | ICD-10-CM | POA: Diagnosis not present

## 2022-04-22 DIAGNOSIS — Z961 Presence of intraocular lens: Secondary | ICD-10-CM | POA: Diagnosis not present

## 2022-04-22 DIAGNOSIS — H52203 Unspecified astigmatism, bilateral: Secondary | ICD-10-CM | POA: Diagnosis not present

## 2022-04-22 DIAGNOSIS — H524 Presbyopia: Secondary | ICD-10-CM | POA: Diagnosis not present

## 2022-04-22 DIAGNOSIS — E119 Type 2 diabetes mellitus without complications: Secondary | ICD-10-CM | POA: Diagnosis not present

## 2022-04-22 LAB — HM DIABETES EYE EXAM

## 2022-04-23 ENCOUNTER — Encounter: Payer: Self-pay | Admitting: Internal Medicine

## 2022-05-14 ENCOUNTER — Other Ambulatory Visit: Payer: Self-pay | Admitting: Internal Medicine

## 2022-05-31 ENCOUNTER — Other Ambulatory Visit: Payer: Medicare HMO

## 2022-05-31 DIAGNOSIS — E119 Type 2 diabetes mellitus without complications: Secondary | ICD-10-CM | POA: Diagnosis not present

## 2022-05-31 DIAGNOSIS — R7989 Other specified abnormal findings of blood chemistry: Secondary | ICD-10-CM

## 2022-05-31 NOTE — Progress Notes (Signed)
Labs drawn

## 2022-06-01 LAB — HEMOGLOBIN A1C
Hgb A1c MFr Bld: 7 % of total Hgb — ABNORMAL HIGH (ref <5.7)
Mean Plasma Glucose: 154 mg/dL
eAG (mmol/L): 8.5 mmol/L

## 2022-06-01 LAB — BASIC METABOLIC PANEL
BUN/Creatinine Ratio: 19 (calc) (ref 6–22)
BUN: 20 mg/dL (ref 7–25)
CO2: 30 mmol/L (ref 20–32)
Calcium: 9.2 mg/dL (ref 8.6–10.4)
Chloride: 103 mmol/L (ref 98–110)
Creat: 1.03 mg/dL — ABNORMAL HIGH (ref 0.60–1.00)
Glucose, Bld: 121 mg/dL — ABNORMAL HIGH (ref 65–99)
Potassium: 4.4 mmol/L (ref 3.5–5.3)
Sodium: 141 mmol/L (ref 135–146)

## 2022-06-03 ENCOUNTER — Ambulatory Visit: Payer: Medicare HMO | Admitting: Internal Medicine

## 2022-06-06 ENCOUNTER — Encounter: Payer: Self-pay | Admitting: Internal Medicine

## 2022-06-06 ENCOUNTER — Ambulatory Visit (INDEPENDENT_AMBULATORY_CARE_PROVIDER_SITE_OTHER): Payer: Medicare HMO | Admitting: Internal Medicine

## 2022-06-06 VITALS — BP 130/84 | HR 96 | Temp 97.0°F | Ht 68.0 in | Wt 209.0 lb

## 2022-06-06 DIAGNOSIS — Z853 Personal history of malignant neoplasm of breast: Secondary | ICD-10-CM

## 2022-06-06 DIAGNOSIS — Z8619 Personal history of other infectious and parasitic diseases: Secondary | ICD-10-CM | POA: Diagnosis not present

## 2022-06-06 DIAGNOSIS — E119 Type 2 diabetes mellitus without complications: Secondary | ICD-10-CM

## 2022-06-06 DIAGNOSIS — I10 Essential (primary) hypertension: Secondary | ICD-10-CM | POA: Diagnosis not present

## 2022-06-06 DIAGNOSIS — Z6831 Body mass index (BMI) 31.0-31.9, adult: Secondary | ICD-10-CM | POA: Diagnosis not present

## 2022-06-06 DIAGNOSIS — F411 Generalized anxiety disorder: Secondary | ICD-10-CM

## 2022-06-06 DIAGNOSIS — Z87898 Personal history of other specified conditions: Secondary | ICD-10-CM | POA: Diagnosis not present

## 2022-06-06 MED ORDER — MECLIZINE HCL 25 MG PO TABS: 25.0000 mg | ORAL_TABLET | Freq: Three times a day (TID) | ORAL | 99 refills | Status: DC | PRN

## 2022-06-06 NOTE — Progress Notes (Signed)
   Subjective:    Patient ID: Victoria [name], female    DOB: [birth year], 81 y.o.   MRN: [id number]  HPI 81 year old Female seen for 6 month follow up.  She has a history of essential hypertension and type 2 diabetes mellitus.  History of recurrent vertigo.  Remote history of hepatitis C treated with Harvoni and cured.  Remote history of breast cancer.  For details see dictation November 30, 2020.  Diabetes treated with oral medications.  History of partial left thyroidectomy at [facility] in 1972.  TSH has always been normal.  Recent labs show hemoglobin A1c to be elevated at 7% and previously was 6.3% in December 2022.  Nonfasting glucose is 121.  The med is normal with the exception of creatinine 1.03 which is stable.  She is on amlodipine and losartan/HCTZ for hypertension.  Takes metformin for diabetes mellitus.  Takes Xanax if needed for anxiety.  Review of Systems see above has some intermittent vertigo which can be treated with Xanax twice daily as needed.     Objective:   Physical Exam Blood pressure stable at 130/84 pulse oximetry 96% weight 209 pounds BMI 31.78 pulse 96 and regular.  Weight is about the same as October 2022.  Skin: Warm and dry.  No cervical adenopathy.  No thyromegaly.  No carotid bruits.  Chest is clear to auscultation.  Cardiac exam: Regular rate and rhythm without ectopy.  No lower extremity pitting edema.       Assessment & Plan:  Hypertension-stable on amlodipine and losartan/HCTZ  Vertigo and anxiety treated with Xanax as needed.  Antivert refilled at patient request  Remote history of breast cancer  Type 2 diabetes mellitus-hemoglobin A1c 7% on metformin 500 mg twice daily.  She is not interested in trying additional diabetic medication at this time.  Plan: She will continue to work on diet exercise and weight loss.  Advised to have a Medicare wellness and health maintenance exam in the near future.  Last Medicare wellness visit was  December 2022 but she did not have health maintenance exam at that time.

## 2022-07-22 NOTE — Patient Instructions (Addendum)
Blood pressure is stable on current regimen of amlodipine and losartan/HCTZ.  Hemoglobin A1c is 7% on metformin.  Please book Medicare wellness visit for December 2023 as well as health maintenance exam.  Continue current medications.  Antivert refilled as requested.  Please have annual mammogram.

## 2022-08-03 ENCOUNTER — Other Ambulatory Visit: Payer: Self-pay | Admitting: Internal Medicine

## 2022-08-11 ENCOUNTER — Other Ambulatory Visit: Payer: Self-pay | Admitting: Internal Medicine

## 2022-08-12 ENCOUNTER — Telehealth: Payer: Self-pay | Admitting: Internal Medicine

## 2022-08-12 NOTE — Telephone Encounter (Signed)
Victoria [name] [phone]  Victoria [name] called to say she has same thing still going on, she is Dizzy,Headache, eyes infected, ears hurting. She keep saying you know, and I would let her know you do not know what's going on with her right now and the above symptoms is what I got. She would like to come in when she can.

## 2022-08-15 ENCOUNTER — Ambulatory Visit (INDEPENDENT_AMBULATORY_CARE_PROVIDER_SITE_OTHER): Payer: Medicare HMO | Admitting: Internal Medicine

## 2022-08-15 ENCOUNTER — Encounter: Payer: Self-pay | Admitting: Internal Medicine

## 2022-08-15 VITALS — BP 100/72 | HR 84 | Temp 97.7°F | Wt 203.4 lb

## 2022-08-15 DIAGNOSIS — J309 Allergic rhinitis, unspecified: Secondary | ICD-10-CM

## 2022-08-15 DIAGNOSIS — I10 Essential (primary) hypertension: Secondary | ICD-10-CM

## 2022-08-15 DIAGNOSIS — J0100 Acute maxillary sinusitis, unspecified: Secondary | ICD-10-CM

## 2022-08-15 DIAGNOSIS — Z853 Personal history of malignant neoplasm of breast: Secondary | ICD-10-CM | POA: Diagnosis not present

## 2022-08-15 DIAGNOSIS — E119 Type 2 diabetes mellitus without complications: Secondary | ICD-10-CM

## 2022-08-15 MED ORDER — ALBUTEROL SULFATE HFA 108 (90 BASE) MCG/ACT IN AERS: 2.0000 | INHALATION_SPRAY | Freq: Four times a day (QID) | RESPIRATORY_TRACT | 99 refills | Status: DC | PRN

## 2022-08-15 MED ORDER — AMOXICILLIN-POT CLAVULANATE 875-125 MG PO TABS: 1.0000 | ORAL_TABLET | Freq: Two times a day (BID) | ORAL | 0 refills | Status: DC

## 2022-08-15 MED ORDER — METHYLPREDNISOLONE ACETATE 80 MG/ML IJ SUSP
80.0000 mg | Freq: Once | INTRAMUSCULAR | Status: AC
  Administered 2022-08-15: 80 mg via INTRAMUSCULAR

## 2022-08-15 NOTE — Progress Notes (Signed)
   Subjective:    Patient ID: Victoria [name], female    DOB: 1941-10-28, 81 y.o.   MRN: [id number]  HPI For about a month now, patient says she has had a clear runny nose, coryza, and cough that is non-productive.  She feels that she has had some allergy issues.  Says this is longstanding for a number of years.  I do not see any formal allergy testing done in Epic.  Does not think she has COVID-19.  Records indicate she had a COVID-vaccine last in October 2022.  She usually gets annual flu vaccine.  She has had pneumococcal 13 and pneumococcal 23 vaccines.  She did have intermittent episodes of sinusitis treated with antibiotics from time to time.  She has a history of vertigo and has had Epley maneuver in the past for dizziness.  Underwent nasal polypectomy by Dr. Redmond Baseman November 2021.  History of essential hypertension and diabetes mellitus.  Remote history of hepatitis C treated with Harvoni which was successful.  Remote history of breast cancer.  History of headaches which may be migraine type headaches and rarely takes hydrocodone APAP for that.  History of diabetes treated with oral medications.  Hemoglobin A1c in June was 7% and previously had been 6.3% in December 2022.  She takes amlodipine for hypertension as well as losartan HCTZ 100/25 daily.  Diabetes is treated with metformin.    Review of Systems she denies fever, chills, nausea, headache     Objective:   Physical Exam Blood pressure 100/72 pulse 84 regular temperature 97.7 degrees pulse oximetry 99% on room air weight 203 pounds 6.4 ounces BMI 30.93 She  sounds a bit nasally congested and has boggy nasal mucosa.  TMs are clear.  Neck is supple without adenopathy.  Chest is clear.  Pharynx is clear.      Assessment & Plan:   Impression: Allergic sinusitis                     Allergic bronchitis                     Diabetes mellitus                     Hypertension  Plan: Ventolin inhaler refilled today which she has used in  the past successfully she says.  Given Augmentin 875 mg/125 twice daily for 10 days.  May take Zyrtec as needed for allergies.  Depo-Medrol 80 mg IM given in office today.

## 2022-09-08 NOTE — Patient Instructions (Signed)
Depo-Medrol 80 mg IM given in office today.  Take Augmentin 875 mg twice daily for 10 days.  May take Zyrtec as needed.  Use Ventolin inhaler which was refilled today.

## 2022-10-06 IMAGING — DX DG CHEST 1V PORT
1 series · 1 of 1 positions shown · non-contrast
Comparison: Prior chest radiographs 11/03/2015 and earlier.

CLINICAL DATA: Shortness of breath.

EXAM:
PORTABLE CHEST 1 VIEW

[chest ap]
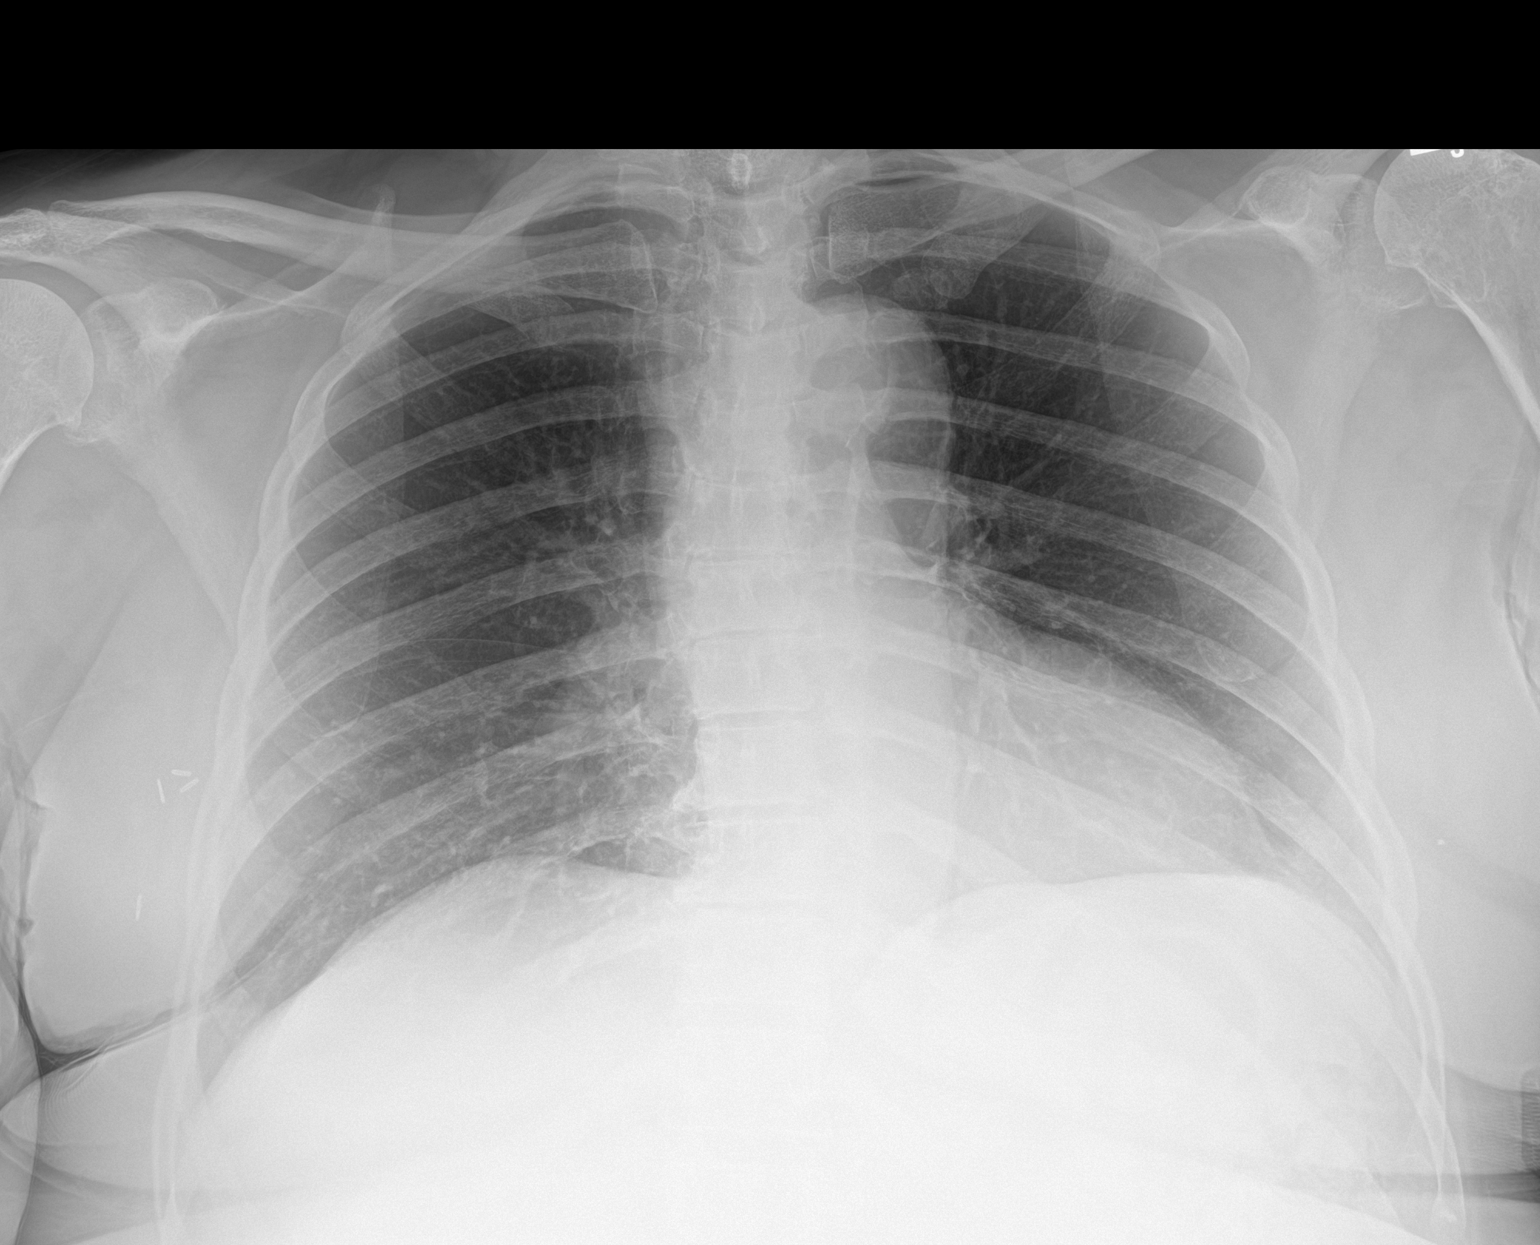

[1 of 1 positions shown; findings below may reference images not displayed]

FINDINGS: Shallow inspiration radiograph. Heart size within normal limits.
Aortic atherosclerosis. No appreciable airspace consolidation or
pulmonary edema. No evidence of pleural effusion or pneumothorax. No
acute bony abnormality identified. Surgical clips project in the
region of the right axilla.
IMPRESSION: Shallow inspiration radiograph. No evidence of acute cardiopulmonary
abnormality.

Aortic Atherosclerosis ([id number]).

## 2022-10-10 ENCOUNTER — Telehealth: Payer: Self-pay | Admitting: Internal Medicine

## 2022-10-10 DIAGNOSIS — E119 Type 2 diabetes mellitus without complications: Secondary | ICD-10-CM

## 2022-10-10 MED ORDER — TRUE METRIX BLOOD GLUCOSE TEST VI STRP: ORAL_STRIP | 99 refills | Status: DC

## 2022-10-10 NOTE — Telephone Encounter (Signed)
Victoria [name] [phone]  Victoria [name] called to say she needs her diabetic supplies sent to her new pharmacy.  Victoria [name] #[id number] Victoria [name], Victoria [name] Phone: [phone]  Fax: [phone]     TRUE METRIX BLOOD GLUCOSE TEST test strip    True Metrix lancets

## 2022-10-17 ENCOUNTER — Ambulatory Visit: Payer: Medicare HMO | Admitting: Internal Medicine

## 2022-11-18 ENCOUNTER — Telehealth: Payer: Self-pay | Admitting: Internal Medicine

## 2022-11-18 NOTE — Telephone Encounter (Signed)
Victoria [name] [phone]  Victoria [name] called to say that both sides of her jaw / thyroid area is swollen like around her esophagus. The right side is worse than the left.

## 2022-11-18 NOTE — Telephone Encounter (Signed)
scheduled

## 2022-11-19 ENCOUNTER — Encounter: Payer: Self-pay | Admitting: Internal Medicine

## 2022-11-19 ENCOUNTER — Ambulatory Visit (INDEPENDENT_AMBULATORY_CARE_PROVIDER_SITE_OTHER): Payer: Medicare HMO | Admitting: Internal Medicine

## 2022-11-19 VITALS — BP 110/76 | HR 99 | Temp 98.2°F

## 2022-11-19 DIAGNOSIS — E119 Type 2 diabetes mellitus without complications: Secondary | ICD-10-CM | POA: Diagnosis not present

## 2022-11-19 DIAGNOSIS — I889 Nonspecific lymphadenitis, unspecified: Secondary | ICD-10-CM | POA: Diagnosis not present

## 2022-11-19 DIAGNOSIS — I10 Essential (primary) hypertension: Secondary | ICD-10-CM

## 2022-11-19 DIAGNOSIS — M545 Low back pain, unspecified: Secondary | ICD-10-CM | POA: Diagnosis not present

## 2022-11-19 DIAGNOSIS — J0100 Acute maxillary sinusitis, unspecified: Secondary | ICD-10-CM | POA: Diagnosis not present

## 2022-11-19 MED ORDER — PREDNISONE 10 MG PO TABS: ORAL_TABLET | ORAL | 0 refills | Status: DC

## 2022-11-19 MED ORDER — AMOXICILLIN-POT CLAVULANATE 500-125 MG PO TABS: 1.0000 | ORAL_TABLET | Freq: Three times a day (TID) | ORAL | 0 refills | Status: DC

## 2022-11-19 MED ORDER — HYDROCODONE-ACETAMINOPHEN 10-325 MG PO TABS: 1.0000 | ORAL_TABLET | Freq: Three times a day (TID) | ORAL | 0 refills | Status: DC | PRN

## 2022-11-19 NOTE — Progress Notes (Signed)
   Subjective:    Patient ID: Victoria [name], female    DOB: December 16, 1941, 81 y.o.   MRN: [id number]  HPI 81 year old Female seen today with complaint of respiratory infection symptoms.  She is concerned about some swelling around her thyroid bilaterally.  Feels that she has come down with a respiratory infection.  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  Received RSV vaccine October 15.  No recent COVID booster.  Says she took a COVID test at home that was negative.  Has maxillary sinus pressure bilaterally.  Also has some left-sided back pain.  No urinary symptoms.  Would like some pain medication for that.  She has a history of type 2 diabetes mellitus.  She has a history of hypertension treated with losartan/HCTZ and amlodipine.  She has type 2 diabetes mellitus treated with metformin.    Review of Systems no nausea, vomiting, fever or shaking chills.  Complaining of maxillary sinus tenderness, malaise and fatigue,     Objective:   Physical Exam  Temperature 98.2 degrees, pulse 99, blood pressure 110/76 pulse oximetry 98%.  Skin: Warm and dry.  She has bilateral enlarged lymph nodes that are slightly tender.  No thyromegaly.  Chest is clear to auscultation.  She is having some left-sided musculoskeletal pain left hip area.  No lower extremity pitting edema.      Assessment & Plan:  Left back musculoskeletal pain-to take Norco 10/325 sparingly 1/2 to 1 tablet as needed every 8 hours. # 15 tablets given  Acute maxillary sinusitis  Bilateral cervical adenopathy-likely benign  Plan: She received RSV vaccine October 15.  Do not have recent flu vaccine on file.  She was reminded about this.  She has albuterol inhaler on hand should she need it.  Gave her today Augmentin 500 mg to take 3 times daily for acute maxillary sinusitis.  Was given a course of prednisone 10 mg starting with 6 tablets day 1 and decreasing by 1 tablet daily i.e. 6-5-4-3-2-1 taper for musculoskeletal pain/sciatica of the left  back area in addition to [place] 10/325 to take 1/2 tablet every 8 hours as needed for pain.  If back pain not improving, we can send her to physical therapy.  Suggest flu vaccine and COVID booster in the near future  History of hypertension  History of type 2 diabetes mellitus and hemoglobin A1c in June was 7.0%.  She is only on metformin 500 mg twice daily.

## 2022-11-19 NOTE — Patient Instructions (Addendum)
Take Norco sparingly for back pain.  Take Augmentin 500 mg 3 times daily for acute maxillary sinusitis.  Take prednisone in tapering course as directed starting with 6 tablets day 1 and decreasing by 1 tablet daily.  If back pain not improving, we can send patient to physical therapy.  Prednisone should also help back pain in addition to maxillary sinusitis.  Blood pressure stable on current regimen.

## 2022-11-20 ENCOUNTER — Telehealth: Payer: Self-pay

## 2022-11-20 MED ORDER — METFORMIN HCL 500 MG PO TABS: ORAL_TABLET | ORAL | 2 refills | Status: DC

## 2022-11-20 NOTE — Telephone Encounter (Signed)
Rx sent to pharmacy   

## 2022-11-21 ENCOUNTER — Inpatient Hospital Stay (HOSPITAL_COMMUNITY): Payer: Medicare HMO

## 2022-11-21 ENCOUNTER — Other Ambulatory Visit: Payer: Self-pay

## 2022-11-21 ENCOUNTER — Emergency Department (HOSPITAL_COMMUNITY): Payer: Medicare HMO

## 2022-11-21 ENCOUNTER — Inpatient Hospital Stay (HOSPITAL_COMMUNITY)
Admission: EM | Admit: 2022-11-21 | Discharge: 2022-11-26 | DRG: 176 | Disposition: A | Discharge status: Home / Self Care | Payer: Medicare HMO | Attending: Internal Medicine | Admitting: Internal Medicine

## 2022-11-21 DIAGNOSIS — Z800 Family history of malignant neoplasm of digestive organs: Secondary | ICD-10-CM | POA: Diagnosis not present

## 2022-11-21 DIAGNOSIS — K8689 Other specified diseases of pancreas: Secondary | ICD-10-CM | POA: Diagnosis not present

## 2022-11-21 DIAGNOSIS — K5730 Diverticulosis of large intestine without perforation or abscess without bleeding: Secondary | ICD-10-CM | POA: Diagnosis not present

## 2022-11-21 DIAGNOSIS — R933 Abnormal findings on diagnostic imaging of other parts of digestive tract: Secondary | ICD-10-CM | POA: Diagnosis not present

## 2022-11-21 DIAGNOSIS — K869 Disease of pancreas, unspecified: Secondary | ICD-10-CM | POA: Diagnosis not present

## 2022-11-21 DIAGNOSIS — I2692 Saddle embolus of pulmonary artery without acute cor pulmonale: Secondary | ICD-10-CM | POA: Diagnosis not present

## 2022-11-21 DIAGNOSIS — Z833 Family history of diabetes mellitus: Secondary | ICD-10-CM | POA: Diagnosis not present

## 2022-11-21 DIAGNOSIS — R0789 Other chest pain: Secondary | ICD-10-CM | POA: Diagnosis present

## 2022-11-21 DIAGNOSIS — C259 Malignant neoplasm of pancreas, unspecified: Secondary | ICD-10-CM | POA: Diagnosis not present

## 2022-11-21 DIAGNOSIS — Z923 Personal history of irradiation: Secondary | ICD-10-CM

## 2022-11-21 DIAGNOSIS — I2699 Other pulmonary embolism without acute cor pulmonale: Secondary | ICD-10-CM | POA: Diagnosis not present

## 2022-11-21 DIAGNOSIS — R590 Localized enlarged lymph nodes: Secondary | ICD-10-CM | POA: Diagnosis not present

## 2022-11-21 DIAGNOSIS — D649 Anemia, unspecified: Secondary | ICD-10-CM | POA: Diagnosis not present

## 2022-11-21 DIAGNOSIS — Z7984 Long term (current) use of oral hypoglycemic drugs: Secondary | ICD-10-CM

## 2022-11-21 DIAGNOSIS — F419 Anxiety disorder, unspecified: Secondary | ICD-10-CM | POA: Diagnosis not present

## 2022-11-21 DIAGNOSIS — K7460 Unspecified cirrhosis of liver: Secondary | ICD-10-CM | POA: Diagnosis present

## 2022-11-21 DIAGNOSIS — Z79899 Other long term (current) drug therapy: Secondary | ICD-10-CM | POA: Diagnosis not present

## 2022-11-21 DIAGNOSIS — Z853 Personal history of malignant neoplasm of breast: Secondary | ICD-10-CM | POA: Diagnosis not present

## 2022-11-21 DIAGNOSIS — E119 Type 2 diabetes mellitus without complications: Secondary | ICD-10-CM | POA: Diagnosis not present

## 2022-11-21 DIAGNOSIS — I700 Atherosclerosis of aorta: Secondary | ICD-10-CM | POA: Diagnosis not present

## 2022-11-21 DIAGNOSIS — D72828 Other elevated white blood cell count: Secondary | ICD-10-CM | POA: Diagnosis not present

## 2022-11-21 DIAGNOSIS — C250 Malignant neoplasm of head of pancreas: Secondary | ICD-10-CM | POA: Diagnosis not present

## 2022-11-21 DIAGNOSIS — I10 Essential (primary) hypertension: Secondary | ICD-10-CM | POA: Diagnosis not present

## 2022-11-21 DIAGNOSIS — I2601 Septic pulmonary embolism with acute cor pulmonale: Secondary | ICD-10-CM | POA: Diagnosis not present

## 2022-11-21 DIAGNOSIS — Z823 Family history of stroke: Secondary | ICD-10-CM | POA: Diagnosis not present

## 2022-11-21 DIAGNOSIS — I2609 Other pulmonary embolism with acute cor pulmonale: Secondary | ICD-10-CM | POA: Diagnosis not present

## 2022-11-21 DIAGNOSIS — I2694 Multiple subsegmental pulmonary emboli without acute cor pulmonale: Principal | ICD-10-CM

## 2022-11-21 DIAGNOSIS — R079 Chest pain, unspecified: Secondary | ICD-10-CM | POA: Diagnosis not present

## 2022-11-21 LAB — BASIC METABOLIC PANEL
Anion gap: 16 — ABNORMAL HIGH (ref 5–15)
BUN: 23 mg/dL (ref 8–23)
CO2: 23 mmol/L (ref 22–32)
Calcium: 9.2 mg/dL (ref 8.9–10.3)
Chloride: 98 mmol/L (ref 98–111)
Creatinine, Ser: 1.11 mg/dL — ABNORMAL HIGH (ref 0.44–1.00)
GFR, Estimated: 51 mL/min — ABNORMAL LOW (ref >60)
Glucose, Bld: 193 mg/dL — ABNORMAL HIGH (ref 70–99)
Potassium: 4.2 mmol/L (ref 3.5–5.1)
Sodium: 137 mmol/L (ref 135–145)

## 2022-11-21 LAB — CBC
HCT: 36.1 % (ref 36.0–46.0)
Hemoglobin: 11.8 g/dL — ABNORMAL LOW (ref 12.0–15.0)
MCH: 27.2 pg (ref 26.0–34.0)
MCHC: 32.7 g/dL (ref 30.0–36.0)
MCV: 83.2 fL (ref 80.0–100.0)
Platelets: 252 10*3/uL (ref 150–400)
RBC: 4.34 MIL/uL (ref 3.87–5.11)
RDW: 14.8 % (ref 11.5–15.5)
WBC: 11.2 10*3/uL — ABNORMAL HIGH (ref 4.0–10.5)
nRBC: 0 % (ref 0.0–0.2)

## 2022-11-21 LAB — HEPATIC FUNCTION PANEL
ALT: 14 U/L (ref 0–44)
AST: 20 U/L (ref 15–41)
Albumin: 3.7 g/dL (ref 3.5–5.0)
Alkaline Phosphatase: 64 U/L (ref 38–126)
Bilirubin, Direct: 0.1 mg/dL (ref 0.0–0.2)
Indirect Bilirubin: 0.4 mg/dL (ref 0.3–0.9)
Total Bilirubin: 0.5 mg/dL (ref 0.3–1.2)
Total Protein: 7.4 g/dL (ref 6.5–8.1)

## 2022-11-21 LAB — PROTIME-INR
INR: 1.2 (ref 0.8–1.2)
Prothrombin Time: 15.1 seconds (ref 11.4–15.2)

## 2022-11-21 LAB — TROPONIN I (HIGH SENSITIVITY)
Troponin I (High Sensitivity): 4 ng/L (ref <18)
Troponin I (High Sensitivity): 6 ng/L (ref <18)

## 2022-11-21 MED ORDER — ALPRAZOLAM 0.25 MG PO TABS
0.2500 mg | ORAL_TABLET | Freq: Three times a day (TID) | ORAL | Status: DC | PRN
  Administered 2022-11-23 – 2022-11-26 (×4): 0.25 mg via ORAL
  Filled 2022-11-21 (×4): qty 1

## 2022-11-21 MED ORDER — ONDANSETRON HCL 4 MG PO TABS: 4.0000 mg | ORAL_TABLET | Freq: Four times a day (QID) | ORAL | Status: DC | PRN

## 2022-11-21 MED ORDER — ACETAMINOPHEN 650 MG RE SUPP: 650.0000 mg | Freq: Four times a day (QID) | RECTAL | Status: DC | PRN

## 2022-11-21 MED ORDER — MORPHINE SULFATE (PF) 2 MG/ML IV SOLN
1.0000 mg | INTRAVENOUS | Status: DC | PRN
  Administered 2022-11-22 (×2): 1 mg via INTRAVENOUS
  Filled 2022-11-21 (×2): qty 1

## 2022-11-21 MED ORDER — OXYCODONE HCL 5 MG PO TABS
5.0000 mg | ORAL_TABLET | ORAL | Status: DC | PRN
  Administered 2022-11-21 – 2022-11-25 (×7): 5 mg via ORAL
  Filled 2022-11-21 (×7): qty 1

## 2022-11-21 MED ORDER — MECLIZINE HCL 25 MG PO TABS: 25.0000 mg | ORAL_TABLET | Freq: Three times a day (TID) | ORAL | Status: DC | PRN

## 2022-11-21 MED ORDER — HEPARIN BOLUS VIA INFUSION
4000.0000 [IU] | Freq: Once | INTRAVENOUS | Status: AC
  Administered 2022-11-21: 4000 [IU] via INTRAVENOUS
  Filled 2022-11-21: qty 4000

## 2022-11-21 MED ORDER — SODIUM CHLORIDE 0.9 % IV BOLUS
500.0000 mL | Freq: Once | INTRAVENOUS | Status: AC
  Administered 2022-11-21: 500 mL via INTRAVENOUS

## 2022-11-21 MED ORDER — METFORMIN HCL 500 MG PO TABS
500.0000 mg | ORAL_TABLET | Freq: Once | ORAL | Status: AC
  Administered 2022-11-21: 500 mg via ORAL
  Filled 2022-11-21: qty 1

## 2022-11-21 MED ORDER — INSULIN ASPART 100 UNIT/ML IJ SOLN
0.0000 [IU] | Freq: Three times a day (TID) | INTRAMUSCULAR | Status: DC
  Administered 2022-11-22 (×2): 2 [IU] via SUBCUTANEOUS
  Administered 2022-11-23 – 2022-11-24 (×4): 3 [IU] via SUBCUTANEOUS
  Administered 2022-11-24: 5 [IU] via SUBCUTANEOUS
  Administered 2022-11-25 – 2022-11-26 (×2): 3 [IU] via SUBCUTANEOUS

## 2022-11-21 MED ORDER — ACETAMINOPHEN 325 MG PO TABS: 650.0000 mg | ORAL_TABLET | Freq: Four times a day (QID) | ORAL | Status: DC | PRN

## 2022-11-21 MED ORDER — HEPARIN (PORCINE) 25000 UT/250ML-% IV SOLN
1550.0000 [IU]/h | INTRAVENOUS | Status: AC
  Administered 2022-11-21 – 2022-11-22 (×2): 1300 [IU]/h via INTRAVENOUS
  Filled 2022-11-21 (×2): qty 250

## 2022-11-21 MED ORDER — ONDANSETRON HCL 4 MG/2ML IJ SOLN: 4.0000 mg | Freq: Four times a day (QID) | INTRAMUSCULAR | Status: DC | PRN

## 2022-11-21 MED ORDER — AMLODIPINE BESYLATE 2.5 MG PO TABS
2.5000 mg | ORAL_TABLET | Freq: Every day | ORAL | Status: DC
  Administered 2022-11-21 – 2022-11-25 (×5): 2.5 mg via ORAL
  Filled 2022-11-21 (×5): qty 1

## 2022-11-21 MED ORDER — IOHEXOL 350 MG/ML SOLN
75.0000 mL | Freq: Once | INTRAVENOUS | Status: AC | PRN
  Administered 2022-11-21: 75 mL via INTRAVENOUS

## 2022-11-21 MED ORDER — HYDROCODONE-ACETAMINOPHEN 5-325 MG PO TABS
1.0000 | ORAL_TABLET | Freq: Once | ORAL | Status: AC
  Administered 2022-11-21: 1 via ORAL
  Filled 2022-11-21: qty 1

## 2022-11-21 NOTE — ED Notes (Signed)
Pt transported to MRI with Kuch RN at side at this time

## 2022-11-21 NOTE — ED Notes (Signed)
The patient refused updated vitals

## 2022-11-21 NOTE — ED Provider Triage Note (Signed)
Emergency Medicine Provider Triage Evaluation Note  Victoria [name] , a 81 y.o. female  was evaluated in triage.  Pt complains of pain in the left lower chest beneath her breast that started last night and persisted throughout the night.  Pain is worse with movement, palpation, coughing and breathing.  She reports that she saw her regular doctor yesterday for ongoing issues with sinus congestion and was placed on prednisone and amoxicillin, but at that time was not experiencing this pain.  Pain does not radiate.  No associated abdominal pain, nausea or vomiting.  Frequent cough that has been ongoing for a few weeks associated with sinus pressure and drainage.  No known fevers.  History of cirrhosis  Review of Systems  Positive: Chest pain, sinus congestion, cough Negative: Shortness of breath, lightheadedness, syncope, abdominal pain, vomiting  Physical Exam  BP (!) 149/112 (BP Location: Right Arm)   Pulse 95   Temp 98.1 F (36.7 C) (Oral)   Resp 16   Ht '5\' 7"'$  (1.702 m)   SpO2 99%   BMI 31.86 kg/m  Gen:   Awake, no distress   Resp:  Normal effort some faint expiratory wheezing, breath sounds present and equal bilaterally MSK:   Moves extremities without difficulty  Other:  Focal tenderness over the left lower chest wall beneath the breast over the rib, worse with palpation and movement  Medical Decision Making  Medically screening exam initiated at 10:14 AM.  Appropriate orders placed.  Victoria [name] was informed that the remainder of the evaluation will be completed by another provider, this initial triage assessment does not replace that evaluation, and the importance of remaining in the ED until their evaluation is complete.     Jacqlyn Larsen, [place] 11/21/22 1025

## 2022-11-21 NOTE — Progress Notes (Signed)
ANTICOAGULATION CONSULT NOTE - Initial Consult  Pharmacy Consult for heparin Indication: pulmonary embolus  Allergies  Allergen Reactions   Lisinopril Cough    Patient Measurements: Height: '5\' 7"'$  (170.2 cm) Weight: 89.7 kg (197 lb 12 oz) IBW/kg (Calculated) : 61.6 Heparin Dosing Weight: 81kg  Vital Signs: Temp: 97.9 F (36.6 C) (11/30 2011) Temp Source: Oral (11/30 2011) BP: 179/93 (11/30 2018) Pulse Rate: 64 (11/30 2018)  Labs: Recent Labs    11/21/22 1018 11/21/22 1023 11/21/22 1207  HGB 11.8*  --   --   HCT 36.1  --   --   PLT 252  --   --   LABPROT  --  15.1  --   INR  --  1.2  --   CREATININE 1.11*  --   --   TROPONINIHS 4  --  6    Estimated Creatinine Clearance: 47.2 mL/min (A) (by C-G formula based on SCr of 1.11 mg/dL (H)).   Medical History: Past Medical History:  Diagnosis Date   Abnormal finding on Pap smear    Allergy    SEASONAL   Anxiety    Breast cancer ([place]) 2000   right breast lumptectomy, radiation done   Cirrhosis ([place]) 11/2012   resolved per pt   Diabetes mellitus type 2, controlled ([place])    Hepatitis C    took tx for    Hypertension    Obesity    Thrombocytopenia ([place]) 11-2012    Medications:  (Not in a hospital admission)  Scheduled:   heparin  4,000 Units Intravenous Once   Infusions:   heparin      Assessment: Pt admitted for CP. CT showed PE with heart strain. IV heparin ordered for anticoagulation. Not record for anticoagulation outpt.   INR 1.2 Hgb 11.8 Plt wnl Scr 1.11  Goal of Therapy:  Heparin level 0.3-0.7 units/ml Monitor platelets by anticoagulation protocol: Yes   Plan:  Heparin 4000 units bolus x1 Heparin infusion 1300 units/hr 8 hr HL then daily Daily CBC  Onnie Boer, PharmD, BCIDP, AAHIVP, CPP Infectious Disease Pharmacist 11/21/2022 9:04 PM

## 2022-11-21 NOTE — ED Triage Notes (Signed)
Pt. Stated, I had chest pain yesterday and all night. Went to my Dr. And she gave Prednisone and Amoxicillin for a sinus infection

## 2022-11-21 NOTE — H&P (Addendum)
[facility] History and Physical  Victoria [name] KGU:[id number] DOB: 05-Apr-1941 DOA: 11/21/2022   PCP: Elby Showers, MD  Specialists: None  Chief Complaint: Left-sided chest pain  HPI: Victoria [name] is a 81 y.o. female with a past medical history of right breast cancer, essential hypertension, diabetes mellitus type 2, previous history of pancreatitis, history of pancreatic lesion who presented with complaints of left sided chest pain.  Onset of pain was yesterday.  8 out of 10 in intensity.  Worsens with movement and with cough.  Denies any blood in the sputum.  No fever or chills.  She was recently seen by her primary care provider.  At that time she was experiencing left back musculoskeletal pain for which she was prescribed Norco.  There was also concern for sinusitis and she was prescribed a course of prednisone and Augmentin, starting this past Tuesday.  Patient is very anxious at this time.  Denies any nausea vomiting.  No dizziness or lightheadedness.  No previous history of blood clots.  No leg swelling.  Denies any recent road trips or air travel.  In the emergency department she was found to have acute pulmonary embolism on CT scan.  There was also concern for pancreatic lesion.  Patient will need hospitalization for further management.  Home Medications: Prior to Admission medications   Medication Sig Start Date End Date Taking? Authorizing Provider  albuterol (VENTOLIN HFA) 108 (90 Base) MCG/ACT inhaler Inhale 2 puffs into the lungs every 6 (six) hours as needed for wheezing or shortness of breath. 08/15/22   Baxley, Cresenciano Lick, MD  ALPRAZolam [name]) 0.5 MG tablet TAKE 1 TABLET(0.5 MG) BY MOUTH TWICE DAILY AS NEEDED FOR ANXIETY 08/12/22   Elby Showers, MD  amLODipine (NORVASC) 2.5 MG tablet TAKE 1 TABLET(2.5 MG) BY MOUTH AT BEDTIME 08/04/22   Elby Showers, MD  amoxicillin-clavulanate (AUGMENTIN) 500-125 MG tablet Take 1 tablet by mouth 3 (three) times daily. 11/19/22    Elby Showers, MD  Blood Glucose Monitoring Suppl (TRUE METRIX METER) w/Device KIT USE AS DIRECTED 07/30/21   Elby Showers, MD  Cetirizine HCl (ZYRTEC ALLERGY PO) Take 1 tablet by mouth daily as needed (allergies).     [provider]  cholecalciferol (VITAMIN D3) 25 MCG (1000 UNIT) tablet Take 1,000 Units by mouth daily.    [provider]  glucose blood (TRUE METRIX BLOOD GLUCOSE TEST) test strip TEST BLOOD SUGAR TWICE DAILY 10/10/22   Elby Showers, MD  HYDROcodone-acetaminophen (NORCO) 10-325 MG tablet Take 1 tablet by mouth every 8 (eight) hours as needed for up to 5 days. 11/19/22 11/24/22  Elby Showers, MD  ibuprofen (ADVIL) 100 MG tablet Take 100 mg by mouth every 6 (six) hours as needed for pain or fever.    [provider]  Lancets (FREESTYLE) lancets CHECK GLUCOSE TWICE A DAY 01/26/20   Elby Showers, MD  losartan-hydrochlorothiazide (HYZAAR) 100-25 MG tablet Take 1 tablet by mouth daily. 12/03/21   Elby Showers, MD  meclizine (ANTIVERT) 25 MG tablet Take 1 tablet (25 mg total) by mouth 3 (three) times daily as needed for dizziness. 06/06/22   Elby Showers, MD  metFORMIN (GLUCOPHAGE) 500 MG tablet TAKE 1 TABLET(500 MG) BY MOUTH TWICE DAILY WITH A MEAL 11/20/22   Elby Showers, MD    Allergies:  Allergies  Allergen Reactions   Lisinopril Cough    Past Medical History: Past Medical History:  Diagnosis Date   Abnormal  finding on Pap smear    Allergy    SEASONAL   Anxiety    Breast cancer ([place]) 2000   right breast lumptectomy, radiation done   Cirrhosis ([place]) 11/2012   resolved per pt   Diabetes mellitus type 2, controlled ([place])    Hepatitis C    took tx for    Hypertension    Obesity    Thrombocytopenia ([place]) 11-2012    Past Surgical History:  Procedure Laterality Date   ANTERIOR AND POSTERIOR REPAIR N/A 02/28/2021   Procedure: ANTERIOR (CYSTOCELE) AND POSTERIOR REPAIR (RECTOCELE);  Surgeon: Cheri Fowler, MD;  Location: [facility];  Service: Gynecology;  Laterality: N/A;   BREAST BIOPSY Left 09/09/2016   BREAST LUMPECTOMY  right   BREAST LUMPECTOMY     BREAST REDUCTION SURGERY  2005   CATARACT EXTRACTION, BILATERAL  2014   CHOLECYSTECTOMY N/A 03/30/2013   Procedure: LAPAROSCOPIC CHOLECYSTECTOMY WITH INTRAOPERATIVE CHOLANGIOGRAM;  Surgeon: Shann Medal, MD;  Location: [facility];  Service: General;  Laterality: N/A;   COLONOSCOPY  2021   CYSTOSCOPY N/A 02/28/2021   Procedure: Consuela Mimes;  Surgeon: Cheri Fowler, MD;  Location: [facility];  Service: Gynecology;  Laterality: N/A;   CYSTOSCOPY W/ RETROGRADES Left 02/28/2021   Procedure: CYSTOSCOPY WITH RETROGRADE PYELOGRAM;  Surgeon: Cheri Fowler, MD;  Location: [facility];  Service: Gynecology;  Laterality: Left;   EUS  01/28/2013   Procedure: UPPER ENDOSCOPIC ULTRASOUND (EUS) LINEAR;  Surgeon: Milus Banister, MD;  Location: [facility];  Service: Endoscopy;  Laterality: N/A;  ride-adrian  [place]der [phone]   EXCISION OF SKIN TAG Right 03/30/2013   Procedure: EXCISION OF SKIN TAG;  Surgeon: Shann Medal, MD;  Location: [facility];  Service: General;  Laterality: Right;   LIVER BIOPSY  03/30/2013   Procedure: LIVER BIOPSY;  Surgeon: Shann Medal, MD;  Location: [facility];  Service: General;;   POLYPECTOMY     REDUCTION MAMMAPLASTY     THYROIDECTOMY, PARTIAL  1972   left   VAGINAL HYSTERECTOMY N/A 02/28/2021   Procedure: HYSTERECTOMY VAGINAL WITH UNILATERAL SALPINGECTOMY;  Surgeon: Cheri Fowler, MD;  Location: [facility];  Service: Gynecology;  Laterality: N/A;    Social History: Denies any history of smoking alcohol use or recreational drug use.  Usually independent with daily activities.  Lives by herself.  Family History:  Family History  Problem Relation Age of Onset   Diabetes Mother    Stroke Mother    Colon cancer Sister 41   Colon polyps Neg Hx    Esophageal cancer Neg Hx    Rectal  cancer Neg Hx    Stomach cancer Neg Hx    Pancreatic cancer Neg Hx    Prostate cancer Neg Hx    Breast cancer Neg Hx      Review of Systems - History obtained from the patient General ROS: positive for  - fatigue Psychological ROS: positive for - anxiety Ophthalmic ROS: negative ENT ROS: negative Allergy and Immunology ROS: negative Hematological and Lymphatic ROS: negative Endocrine ROS: negative Respiratory ROS: As in HPI Cardiovascular ROS: As in HPI Gastrointestinal ROS: no abdominal pain, change in bowel habits, or black or bloody stools Genito-Urinary ROS: no dysuria, trouble voiding, or hematuria Musculoskeletal ROS: negative Neurological ROS: no TIA or stroke symptoms Dermatological ROS: negative  Physical Examination  Vitals:   11/21/22 2011 11/21/22 2018 11/21/22 2120 11/21/22 2200  BP:  (!) 179/93 (!) 169/97 (!) 161/109  Pulse:  64 (!) 57 (!) 54  Resp:  _0 Temp: 97.9 F (36.6 C)     TempSrc: Oral     SpO2:  100% 96% 98%  Weight: 89.7 kg     Height: _1  (1.702 m)       BP (!) 161/109   Pulse (!) 54   Temp 97.9 F (36.6 C) (Oral)   Resp 14   Ht _2  (1.702 m)   Wt 89.7 kg   SpO2 98%   BMI 30.97 kg/m   General appearance: alert, cooperative, appears stated age, and no distress Head: Normocephalic, without obvious abnormality, atraumatic Pupils are equal reacting.  Extraocular movements intact.  No pallor no icterus. Throat: lips, mucosa, and tongue normal; teeth and gums normal Back: symmetric, no curvature. ROM normal. No CVA tenderness. Resp: clear to auscultation bilaterally Breasts: Patient raised concern for possible lump in her left breast.  Could not palpate any lumps at this time.  But exam was limited due to significant pain that the patient experienced with any movement.  She will need full evaluation/examination in the outpatient setting once her chest pain is better. Cardio: regular rate and rhythm, S1, S2 normal, no murmur, click,  rub or gallop GI: soft, non-tender; bowel sounds normal; no masses,  no organomegaly Extremities: extremities normal, atraumatic, no cyanosis or edema Pulses: 2+ and symmetric Skin: Skin color, texture, turgor normal. No rashes or lesions Lymph nodes: Cervical, supraclavicular, and axillary nodes normal. Neurologic: Alert and oriented x 3.  Cranial nerves II to XII intact.  Motor strength equal bilateral upper and lower extremities.   Labs on Admission: I have personally reviewed following labs and imaging studies  CBC: Recent Labs  Lab 11/21/22 1018  WBC 11.2*  HGB 11.8*  HCT 36.1  MCV 83.2  PLT 034   Basic Metabolic Panel: Recent Labs  Lab 11/21/22 1018  NA 137  K 4.2  CL 98  CO2 23  GLUCOSE 193*  BUN 23  CREATININE 1.11*  CALCIUM 9.2   GFR: Estimated Creatinine Clearance: 47.2 mL/min (A) (by C-G formula based on SCr of 1.11 mg/dL (H)). Liver Function Tests: Recent Labs  Lab 11/21/22 1018  AST 20  ALT 14  ALKPHOS 64  BILITOT 0.5  PROT 7.4  ALBUMIN 3.7    Coagulation Profile: Recent Labs  Lab 11/21/22 1023  INR 1.2     Radiological Exams on Admission: CT Angio Chest PE W and/or Wo Contrast  Result Date: 11/21/2022 CLINICAL DATA:  Pulmonary embolism. EXAM: CT ANGIOGRAPHY CHEST WITH CONTRAST TECHNIQUE: Multidetector CT imaging of the chest was performed using the standard protocol during bolus administration of intravenous contrast. Multiplanar CT image reconstructions and MIPs were obtained to evaluate the vascular anatomy. RADIATION DOSE REDUCTION: This exam was performed according to the departmental dose-optimization program which includes automated exposure control, adjustment of the mA and/or kV according to patient size and/or use of iterative reconstruction technique. CONTRAST:  30m OMNIPAQUE IOHEXOL 350 MG/ML SOLN COMPARISON:  None Available. FINDINGS: Cardiovascular: There is moderate severity calcification of the aortic arch, without evidence of  aortic aneurysm. Satisfactory opacification of the pulmonary arteries to the segmental level. A mild-to-moderate amount of pulmonary embolism is seen within the distal aspect of the right pulmonary artery. Extension into the proximal portions of the lower lobe branches of the right pulmonary artery is noted. Normal heart size with right heart strain (RV/LV ratio of 1.28). No pericardial effusion. Mediastinum/Nodes: No enlarged mediastinal, hilar, or axillary  lymph nodes. The left lobe of the thyroid gland is enlarged and heterogeneous in appearance. The trachea and esophagus demonstrate no significant findings. Lungs/Pleura: Mild, slightly nodular appearing areas of focal scarring and/or atelectasis are seen along the periphery of both lungs. This is slightly more prominent within the bilateral lower lobes. There is no evidence of a pleural effusion or pneumothorax. Upper Abdomen: Multiple surgical clips are seen within the gallbladder fossa. A 4.0 cm x 4.7 cm heterogeneous soft tissue mass is suspected within the region of the head of the pancreas. This area is limited in evaluation in the absence of intravenous contrast. Noninflamed diverticula are seen throughout the large bowel. Musculoskeletal: Diffusely sclerotic vertebral bodies are seen with multilevel degenerative changes noted throughout the thoracic spine. Review of the MIP images confirms the above findings. IMPRESSION: 1. Mild-to-moderate amount of pulmonary embolism within the distal aspect of the right pulmonary artery. 2. Right heart strain with an RV/LV ratio of 1.28. 3. Mild, slightly nodular appearing areas of peripheral, bilateral lobe scarring and/or atelectasis. 4. Colonic diverticulosis. 5. Findings may represent a soft tissue mass within the region of the head of the pancreas. Further evaluation with MRI is recommended, as an underlying neoplastic process cannot be excluded. 6. Aortic atherosclerosis. Aortic Atherosclerosis (ICD10-I70.0).  Electronically Signed   By: Virgina Norfolk M.D.   On: 11/21/2022 19:40   DG Chest 2 View  Result Date: 11/21/2022 CLINICAL DATA:  Chest pain. EXAM: CHEST - 2 VIEW COMPARISON:  November 12, 2021. FINDINGS: The heart size and mediastinal contours are within normal limits. Both lungs are clear. The visualized skeletal structures are unremarkable. IMPRESSION: No active cardiopulmonary disease. Electronically Signed   By: Marijo Conception M.D.   On: 11/21/2022 10:40    My interpretation of Electrocardiogram: Sinus rhythm in the 80s.  Normal axis.  Intervals appear to be normal.  Nonspecific T wave changes.  No concerning ST segment changes.   Problem List  Principal Problem:   Acute pulmonary embolism (HCC) Active Problems:   Diabetes mellitus ([place])   Hypertension   History of breast cancer   Pancreatic lesion   Assessment: This is a 81 year old African-American female with past medical history as stated earlier who comes in with chest pain and is found to have acute pulmonary embolism.  There is some concern for right heart strain.  Troponin levels are normal.  No obvious risk factors identified.  She does have a history of breast cancer.  There is also a pancreatic lesion which will need further workup.  Plan:  #1. Acute pulmonary embolism: Has been started on IV heparin.  Will order echocardiogram and lower extremity Doppler studies.  She is hemodynamically stable.  As mentioned above no clear etiology identified but patient will need further workup.  #2. Pancreatic lesion: CT scan incidentally showed a soft tissue mass measuring about 4 x 4.7 cm in the head of the pancreas.  Based on previous records it appears that the patient does have a history of pancreatitis.  She had a MRI of the abdomen in 2015 which showed cystic appearing lesions in the distal body and tail of the pancreas.  She had MRCP in 2013 which showed 9 mm structure in the pancreatic head.  It looks like this lesion has  increased in size.  She merits another MRI which will be ordered while she is here in the hospital.  #3.  History of breast cancer: This was in the right breast, status post lumpectomy and radiation  treatment several years ago.  Today she raised concern about possible lesion in the left breast.  I could not palpate any lesion in the left breast especially in the area where she was pointing.  Exam limited by positioning due to significant left-sided chest pain with movement.  She will need a full evaluation including a mammogram after discharge.  #4.  Diabetes mellitus type 2: Monitor CBGs.  SSI.  Holding her home medications for now.  #5.  Essential hypertension: Continue with amlodipine.  #6.  Nodular appearing areas of peripheral bilateral lobe scarring and/or atelectasis noted incidentally on CT scan.  May need further evaluation in the outpatient setting.   DVT Prophylaxis: On IV heparin Code Status: Full code Family Communication: Discussed with patient Disposition: Hopefully return home in improved Consults called: None yet Admission Status: Status is: Inpatient Remains inpatient appropriate because: Acute pulmonary embolism with right heart strain    Severity of Illness: The appropriate patient status for this patient is INPATIENT. Inpatient status is judged to be reasonable and necessary in order to provide the required intensity of service to ensure the patient's safety. The patient's presenting symptoms, physical exam findings, and initial radiographic and laboratory data in the context of their chronic comorbidities is felt to place them at high risk for further clinical deterioration. Furthermore, it is not anticipated that the patient will be medically stable for discharge from the hospital within 2 midnights of admission.   * I certify that at the point of admission it is my clinical judgment that the patient will require inpatient hospital care spanning beyond 2 midnights from  the point of admission due to high intensity of service, high risk for further deterioration and high frequency of surveillance required.*   Further management decisions will depend on results of further testing and patient's response to treatment.   Hernandez Losasso [employer]  [facility] operational officer on [email or web address]  11/21/2022, 10:25 PM

## 2022-11-21 NOTE — ED Notes (Signed)
Iv team at bedside  

## 2022-11-21 NOTE — ED Notes (Signed)
Provider at bedside at this time

## 2022-11-21 NOTE — ED Notes (Signed)
Admit provider at bedside 

## 2022-11-21 NOTE — ED Provider Notes (Signed)
[facility] EMERGENCY DEPARTMENT Provider Note   CSN: [id number] Arrival date & time: 11/21/22  3159    History  Chief Complaint  Patient presents with   Chest Pain    Victoria [name] is a 81 y.o. female for evaluation of left lower chest pain.  Began yesterday.  Pain worsens with movement, palpation, coughing and deep breathing.  States she saw her PCP yesterday noted some congestion, rhinorrhea, cough was placed on steroids and antibiotics-amoxicillin.  Pain does not radiate to back, left arm or jaw.  Nonexertional.  She denies any pain or swelling to her legs however wears compression socks at baseline.  Cough has been present actually over the last 3 weeks.  States she has had recurrent sinus congestion and rhinorrhea as well.  She denies any hemoptysis.  No recent surgery, immobilization or malignancy.  Had a few episodes of loose stool without any melena or bright blood per rectum.  Does have history of cirrhosis however denies any abdominal pain, EtOH use, NSAID use.  She denies any abdominal distention.  No fever, headache, neck pain, back pain, shortness of breath, syncope, vomiting.  HPI     Home Medications Prior to Admission medications   Medication Sig Start Date End Date Taking? Authorizing Provider  albuterol (VENTOLIN HFA) 108 (90 Base) MCG/ACT inhaler Inhale 2 puffs into the lungs every 6 (six) hours as needed for wheezing or shortness of breath. 08/15/22   Baxley, Cresenciano Lick, MD  ALPRAZolam [name]) 0.5 MG tablet TAKE 1 TABLET(0.5 MG) BY MOUTH TWICE DAILY AS NEEDED FOR ANXIETY 08/12/22   Elby Showers, MD  amLODipine (NORVASC) 2.5 MG tablet TAKE 1 TABLET(2.5 MG) BY MOUTH AT BEDTIME 08/04/22   Elby Showers, MD  amoxicillin-clavulanate (AUGMENTIN) 500-125 MG tablet Take 1 tablet by mouth 3 (three) times daily. 11/19/22   Elby Showers, MD  Blood Glucose Monitoring Suppl (TRUE METRIX METER) w/Device KIT USE AS DIRECTED 07/30/21   Elby Showers, MD   Cetirizine HCl (ZYRTEC ALLERGY PO) Take 1 tablet by mouth daily as needed (allergies).     [provider]  cholecalciferol (VITAMIN D3) 25 MCG (1000 UNIT) tablet Take 1,000 Units by mouth daily.    [provider]  glucose blood (TRUE METRIX BLOOD GLUCOSE TEST) test strip TEST BLOOD SUGAR TWICE DAILY 10/10/22   Elby Showers, MD  HYDROcodone-acetaminophen (NORCO) 10-325 MG tablet Take 1 tablet by mouth every 8 (eight) hours as needed for up to 5 days. 11/19/22 11/24/22  Elby Showers, MD  ibuprofen (ADVIL) 100 MG tablet Take 100 mg by mouth every 6 (six) hours as needed for pain or fever.    [provider]  Lancets (FREESTYLE) lancets CHECK GLUCOSE TWICE A DAY 01/26/20   Elby Showers, MD  losartan-hydrochlorothiazide (HYZAAR) 100-25 MG tablet Take 1 tablet by mouth daily. 12/03/21   Elby Showers, MD  meclizine (ANTIVERT) 25 MG tablet Take 1 tablet (25 mg total) by mouth 3 (three) times daily as needed for dizziness. 06/06/22   Elby Showers, MD  metFORMIN (GLUCOPHAGE) 500 MG tablet TAKE 1 TABLET(500 MG) BY MOUTH TWICE DAILY WITH A MEAL 11/20/22   Elby Showers, MD  predniSONE (DELTASONE) 10 MG tablet Take in tapering course as directed 6-5-4-3-2-1 11/19/22   Elby Showers, MD      Allergies    Lisinopril    Review of Systems   Review of Systems  Constitutional: Negative.   HENT:  Positive for congestion, postnasal drip, rhinorrhea, sinus pressure and sinus pain. Negative for dental problem, drooling, ear discharge, ear pain, facial swelling, hearing loss, mouth sores, nosebleeds, sore throat, tinnitus, trouble swallowing and voice change.   Respiratory:  Positive for cough. Negative for apnea, choking, chest tightness, shortness of breath, wheezing and stridor.   Cardiovascular:  Positive for chest pain. Negative for palpitations and leg swelling.  Gastrointestinal: Negative.   Genitourinary: Negative.   Musculoskeletal: Negative.   Skin: Negative.    Neurological: Negative.   All other systems reviewed and are negative.   Physical Exam Updated Vital Signs BP (!) 179/93   Pulse 64   Temp 97.9 F (36.6 C) (Oral)   Resp 11   Ht _0  (1.702 m)   Wt 89.7 kg   SpO2 100%   BMI 30.97 kg/m  Physical Exam Vitals and nursing note reviewed.  Constitutional:      General: She is not in acute distress.    Appearance: She is well-developed. She is not ill-appearing, toxic-appearing or diaphoretic.  HENT:     Head: Atraumatic.  Eyes:     Pupils: Pupils are equal, round, and reactive to light.  Cardiovascular:     Rate and Rhythm: Normal rate.     Pulses:          Radial pulses are 2+ on the right side and 2+ on the left side.     Heart sounds: Normal heart sounds.  Pulmonary:     Effort: Pulmonary effort is normal. No respiratory distress.     Breath sounds: Normal breath sounds. No decreased breath sounds or wheezing.     Comments: Clear, speaks in full sentences Chest:     Chest wall: Tenderness present.     Comments: Tenderness left lower chest wall without erythema, warmth.  Prior surgical site incision present which appears old, well-healed. Abdominal:     General: Bowel sounds are normal. There is no distension or abdominal bruit.     Palpations: Abdomen is soft. There is no fluid wave, hepatomegaly, splenomegaly or mass.     Tenderness: There is no abdominal tenderness. There is no guarding or rebound.     Comments: Abdomen soft, nontender.  No fluid wave  Musculoskeletal:        General: Normal range of motion.     Cervical back: Normal range of motion.     Right lower leg: No tenderness. No edema.     Left lower leg: No tenderness. No edema.     Comments: No bony tenderness, full range of motion, compartment soft  Skin:    General: Skin is warm and dry.     Capillary Refill: Capillary refill takes less than 2 seconds.     Comments: No rashes or lesions.  No vesicles  Neurological:     General: No focal deficit  present.     Mental Status: She is alert and oriented to person, place, and time.     Comments: CN 2-12 grossly intact Ambulatory Equal strength BIL  Psychiatric:        Mood and Affect: Mood normal.     ED Results / Procedures / Treatments   Labs (all labs ordered are listed, but only abnormal results are displayed) Labs Reviewed  BASIC METABOLIC PANEL - Abnormal; Notable for the following components:      Result Value   Glucose, Bld 193 (*)    Creatinine, Ser 1.11 (*)    GFR, Estimated 51 (*)  Anion gap 16 (*)    All other components within normal limits  CBC - Abnormal; Notable for the following components:   WBC 11.2 (*)    Hemoglobin 11.8 (*)    All other components within normal limits  HEPATIC FUNCTION PANEL  PROTIME-INR  TROPONIN I (HIGH SENSITIVITY)  TROPONIN I (HIGH SENSITIVITY)    EKG None  Radiology CT Angio Chest PE W and/or Wo Contrast  Result Date: 11/21/2022 CLINICAL DATA:  Pulmonary embolism. EXAM: CT ANGIOGRAPHY CHEST WITH CONTRAST TECHNIQUE: Multidetector CT imaging of the chest was performed using the standard protocol during bolus administration of intravenous contrast. Multiplanar CT image reconstructions and MIPs were obtained to evaluate the vascular anatomy. RADIATION DOSE REDUCTION: This exam was performed according to the departmental dose-optimization program which includes automated exposure control, adjustment of the mA and/or kV according to patient size and/or use of iterative reconstruction technique. CONTRAST:  63m OMNIPAQUE IOHEXOL 350 MG/ML SOLN COMPARISON:  None Available. FINDINGS: Cardiovascular: There is moderate severity calcification of the aortic arch, without evidence of aortic aneurysm. Satisfactory opacification of the pulmonary arteries to the segmental level. A mild-to-moderate amount of pulmonary embolism is seen within the distal aspect of the right pulmonary artery. Extension into the proximal portions of the lower lobe  branches of the right pulmonary artery is noted. Normal heart size with right heart strain (RV/LV ratio of 1.28). No pericardial effusion. Mediastinum/Nodes: No enlarged mediastinal, hilar, or axillary lymph nodes. The left lobe of the thyroid gland is enlarged and heterogeneous in appearance. The trachea and esophagus demonstrate no significant findings. Lungs/Pleura: Mild, slightly nodular appearing areas of focal scarring and/or atelectasis are seen along the periphery of both lungs. This is slightly more prominent within the bilateral lower lobes. There is no evidence of a pleural effusion or pneumothorax. Upper Abdomen: Multiple surgical clips are seen within the gallbladder fossa. A 4.0 cm x 4.7 cm heterogeneous soft tissue mass is suspected within the region of the head of the pancreas. This area is limited in evaluation in the absence of intravenous contrast. Noninflamed diverticula are seen throughout the large bowel. Musculoskeletal: Diffusely sclerotic vertebral bodies are seen with multilevel degenerative changes noted throughout the thoracic spine. Review of the MIP images confirms the above findings. IMPRESSION: 1. Mild-to-moderate amount of pulmonary embolism within the distal aspect of the right pulmonary artery. 2. Right heart strain with an RV/LV ratio of 1.28. 3. Mild, slightly nodular appearing areas of peripheral, bilateral lobe scarring and/or atelectasis. 4. Colonic diverticulosis. 5. Findings may represent a soft tissue mass within the region of the head of the pancreas. Further evaluation with MRI is recommended, as an underlying neoplastic process cannot be excluded. 6. Aortic atherosclerosis. Aortic Atherosclerosis (ICD10-I70.0). Electronically Signed   By: TVirgina NorfolkM.D.   On: 11/21/2022 19:40   DG Chest 2 View  Result Date: 11/21/2022 CLINICAL DATA:  Chest pain. EXAM: CHEST - 2 VIEW COMPARISON:  November 12, 2021. FINDINGS: The heart size and mediastinal contours are within  normal limits. Both lungs are clear. The visualized skeletal structures are unremarkable. IMPRESSION: No active cardiopulmonary disease. Electronically Signed   By: JMarijo ConceptionM.D.   On: 11/21/2022 10:40    Procedures .Critical Care  Performed by: HNettie Elm PA-C Authorized by: HNettie Elm PA-C   Critical care provider statement:    Critical care time (minutes):  35   Critical care was necessary to treat or prevent imminent or life-threatening deterioration of the following conditions:  Circulatory failure and respiratory failure   Critical care was time spent personally by me on the following activities:  Development of treatment plan with patient or surrogate, discussions with consultants, evaluation of patient's response to treatment, examination of patient, ordering and review of laboratory studies, ordering and review of radiographic studies, ordering and performing treatments and interventions, pulse oximetry, re-evaluation of patient's condition and review of old charts     Medications Ordered in ED Medications  metFORMIN (GLUCOPHAGE) tablet 500 mg (500 mg Oral Given 11/21/22 1748)  sodium chloride 0.9 % bolus 500 mL (500 mLs Intravenous New Bag/Given 11/21/22 1748)  iohexol (OMNIPAQUE) 350 MG/ML injection 75 mL (75 mLs Intravenous Contrast Given 11/21/22 1904)  HYDROcodone-acetaminophen (NORCO/VICODIN) 5-325 MG per tablet 1 tablet (1 tablet Oral Given 11/21/22 2022)    ED Course/ Medical Decision Making/ A&P    81 year old here for evaluation of chest pain.  Began yesterday.  Worse with movement, deep breathing, palpation and cough.  Has had ongoing cough, congestion and rhinorrhea over the last few weeks.  She has been seen by her PCP for this.  Started on steroids and amoxicillin yesterday.  No hemoptysis.  She does not appear grossly fluid overloaded.  Calves are nontender.  She does have remote history of breast cancer.  Pain is reproducible on exam.   Unfortunately she waited on the waiting room for approximately 7 hours.  She is very upset that her chest x-ray does not show pneumonia.  She is requesting a CT scan of her chest.  Although her pain is reproducible on exam we will plan on CTA.  Clinically she has some congestion, rhinorrhea.  Posterior oropharynx is clear.  She is tolerating p.o. intake.  She has no rashes or vesicles to suggest shingles as cause of her chest wall pain.  Pain is not exertional, does not radiate.  Low suspicion for acute ACS, PE or dissection.  No back pain.  Does not appear volume overloaded low suspicion for fluid overload.   Labs and imaging personally viewed and interpreted:  CBC leukocytosis at 11.2 (on steroids) BMP glucose 193, creatinine 1.11, anion gap 16>> getting IVF Trop 4>>6, flat Hep fun panel without abnormality Chest xray without cardiomegaly, pulmonary edema, pneumothorax, infiltrates EKG without ischemic changes  Patient reassessed.  No hypoxia.  Now states she has pain on right, worse with deep breathing.  Pending CT scan.  She does not want any pain medication at this time.  Called from radiologist.  Patient has mild/moderate right PE with right heart strain.  Patient denies any recent falls, clotting disorder, GI bleeds.  Will start on heparin.  Of note he did also mention a possible mass in her pancreas which radiology recommended MRI of to ensure this is not CA given her history.  I discussed results with patient, nursing at bedside.  She is agreeable for admission and heparin.  We did discuss possible further imaging of her pancreas.  She does note history of prior pancreatitis however denies any prior history of malignant process.  I offered to call her family given she is alone here in the emergency department however patient declined.  Does now note that she has felt a small mass in her left breast over the last few weeks.  No recent ultrasound or mammogram.  CONSULT with Dr. Maryland Pink with  [facility] who is agreeable to assess patient for admission  The patient appears reasonably stabilized for admission considering the current resources, flow, and capabilities available in the ED  at this time, and I doubt any other [facility] requiring further screening and/or treatment in the ED prior to admission.                             Medical Decision Making Amount and/or Complexity of Data Reviewed External Data Reviewed: labs, radiology, ECG and notes. Labs: ordered. Decision-making details documented in ED Course. Radiology: ordered and independent interpretation performed. Decision-making details documented in ED Course. ECG/medicine tests: ordered and independent interpretation performed. Decision-making details documented in ED Course.  Risk OTC drugs. Prescription drug management. Parenteral controlled substances. Decision regarding hospitalization. Diagnosis or treatment significantly limited by social determinants of health.          Final Clinical Impression(s) / ED Diagnoses Final diagnoses:  Multiple subsegmental pulmonary emboli without acute cor pulmonale (HCC)  Pancreatic mass  History of breast cancer    Rx / DC Orders ED Discharge Orders     None         Cecilia Vancleve A, PA-C 11/21/22 2028    Fredia Sorrow, MD 11/23/22 0001

## 2022-11-21 NOTE — ED Notes (Signed)
This patient refused vitals again

## 2022-11-21 NOTE — ED Notes (Signed)
Pt ambulating to restroom without assistance with steady gait

## 2022-11-21 NOTE — ED Notes (Signed)
Received verbal report from [place] at this time

## 2022-11-22 ENCOUNTER — Inpatient Hospital Stay (HOSPITAL_COMMUNITY): Payer: Medicare HMO

## 2022-11-22 ENCOUNTER — Encounter (HOSPITAL_COMMUNITY): Payer: Self-pay | Admitting: Internal Medicine

## 2022-11-22 DIAGNOSIS — I2699 Other pulmonary embolism without acute cor pulmonale: Secondary | ICD-10-CM

## 2022-11-22 DIAGNOSIS — I2609 Other pulmonary embolism with acute cor pulmonale: Secondary | ICD-10-CM

## 2022-11-22 LAB — ECHOCARDIOGRAM COMPLETE
Area-P 1/2: 2.8 cm2
Height: 67 in
S' Lateral: 2.2 cm
Weight: 3073.6 oz

## 2022-11-22 LAB — CBC
HCT: 34.7 % — ABNORMAL LOW (ref 36.0–46.0)
Hemoglobin: 11.3 g/dL — ABNORMAL LOW (ref 12.0–15.0)
MCH: 27.2 pg (ref 26.0–34.0)
MCHC: 32.6 g/dL (ref 30.0–36.0)
MCV: 83.4 fL (ref 80.0–100.0)
Platelets: 241 10*3/uL (ref 150–400)
RBC: 4.16 MIL/uL (ref 3.87–5.11)
RDW: 15.4 % (ref 11.5–15.5)
WBC: 10.6 10*3/uL — ABNORMAL HIGH (ref 4.0–10.5)
nRBC: 0 % (ref 0.0–0.2)

## 2022-11-22 LAB — BASIC METABOLIC PANEL
Anion gap: 11 (ref 5–15)
BUN: 23 mg/dL (ref 8–23)
CO2: 25 mmol/L (ref 22–32)
Calcium: 8.8 mg/dL — ABNORMAL LOW (ref 8.9–10.3)
Chloride: 103 mmol/L (ref 98–111)
Creatinine, Ser: 0.98 mg/dL (ref 0.44–1.00)
GFR, Estimated: 59 mL/min — ABNORMAL LOW (ref >60)
Glucose, Bld: 141 mg/dL — ABNORMAL HIGH (ref 70–99)
Potassium: 3.5 mmol/L (ref 3.5–5.1)
Sodium: 139 mmol/L (ref 135–145)

## 2022-11-22 LAB — GLUCOSE, CAPILLARY
Glucose-Capillary: 135 mg/dL — ABNORMAL HIGH (ref 70–99)
Glucose-Capillary: 133 mg/dL — ABNORMAL HIGH (ref 70–99)
Glucose-Capillary: 149 mg/dL — ABNORMAL HIGH (ref 70–99)
Glucose-Capillary: 139 mg/dL — ABNORMAL HIGH (ref 70–99)

## 2022-11-22 LAB — HEPARIN LEVEL (UNFRACTIONATED)
Heparin Unfractionated: 0.59 IU/mL (ref 0.30–0.70)
Heparin Unfractionated: <0.1 IU/mL — ABNORMAL LOW (ref 0.30–0.70)

## 2022-11-22 MED ORDER — APIXABAN 5 MG PO TABS
10.0000 mg | ORAL_TABLET | Freq: Two times a day (BID) | ORAL | Status: DC
  Administered 2022-11-22 – 2022-11-23 (×2): 10 mg via ORAL
  Filled 2022-11-22 (×2): qty 2

## 2022-11-22 MED ORDER — HYDROCHLOROTHIAZIDE 25 MG PO TABS
25.0000 mg | ORAL_TABLET | Freq: Every day | ORAL | Status: DC
  Administered 2022-11-22 – 2022-11-26 (×4): 25 mg via ORAL
  Filled 2022-11-22 (×4): qty 1

## 2022-11-22 MED ORDER — LOSARTAN POTASSIUM-HCTZ 100-25 MG PO TABS: 1.0000 | ORAL_TABLET | Freq: Every day | ORAL | Status: DC

## 2022-11-22 MED ORDER — APIXABAN 5 MG PO TABS: 5.0000 mg | ORAL_TABLET | Freq: Two times a day (BID) | ORAL | Status: DC

## 2022-11-22 MED ORDER — LOSARTAN POTASSIUM 50 MG PO TABS
100.0000 mg | ORAL_TABLET | Freq: Every day | ORAL | Status: DC
  Administered 2022-11-22 – 2022-11-26 (×4): 100 mg via ORAL
  Filled 2022-11-22 (×4): qty 2

## 2022-11-22 MED ORDER — HEPARIN BOLUS VIA INFUSION
2000.0000 [IU] | Freq: Once | INTRAVENOUS | Status: AC
  Administered 2022-11-22: 2000 [IU] via INTRAVENOUS
  Filled 2022-11-22: qty 2000

## 2022-11-22 NOTE — Progress Notes (Signed)
[facility] for heparin >> Eliquis Indication: pulmonary embolus  Allergies  Allergen Reactions   Lisinopril Cough    Patient Measurements: Height: '5\' 7"'$  (170.2 cm) Weight: 87.1 kg (192 lb 1.6 oz) IBW/kg (Calculated) : 61.6 Heparin Dosing Weight: 81 kg  Vital Signs: Temp: 96.7 F (35.9 C) (12/01 1537) Temp Source: Axillary (12/01 1537) BP: 129/77 (12/01 1537) Pulse Rate: 99 (12/01 1537)  Labs: Recent Labs    11/21/22 1018 11/21/22 1023 11/21/22 1207 11/22/22 0457 11/22/22 1208  HGB 11.8*  --   --  11.3*  --   HCT 36.1  --   --  34.7*  --   PLT 252  --   --  241  --   LABPROT  --  15.1  --   --   --   INR  --  1.2  --   --   --   HEPARINUNFRC  --   --   --  0.59 <0.10*  CREATININE 1.11*  --   --  0.98  --   TROPONINIHS 4  --  6  --   --      Estimated Creatinine Clearance: 52.8 mL/min (by C-G formula based on SCr of 0.98 mg/dL).   Assessment: 81 yo F on heparin for PE, now to be transitioned to Eliquis.  Renal function and CBC stable.  Goal of Therapy:  Appropriate anticoagulation  Monitor platelets by anticoagulation protocol: Yes   Plan:  Eliquis '10mg'$  PO BID x 7 days, then on 11/29/22 PM start '5mg'$  PO BID Pharmacy will sign off and follow peripherally.  Thank you for the consult!  Veron Senner D. Mina Marble, PharmD, BCPS, [place] 11/22/2022, 7:18 PM

## 2022-11-22 NOTE — Discharge Instructions (Signed)
Information on my medicine - ELIQUIS (apixaban)  This medication education was reviewed with me or my healthcare representative as part of my discharge preparation.    Why was Eliquis prescribed for you? Eliquis was prescribed to treat blood clots that may have been found in the veins of your legs (deep vein thrombosis) or in your lungs (pulmonary embolism) and to reduce the risk of them occurring again.  What do You need to know about Eliquis ? The starting dose is 10 mg (two 5 mg tablets) taken TWICE daily. Then on 12/02/22  the dose is reduced to one 5 mg tablet taken TWICE daily.  Eliquis may be taken with or without food.   Try to take the dose about the same time in the morning and in the evening. If you have difficulty swallowing the tablet whole please discuss with your pharmacist how to take the medication safely.  Take Eliquis exactly as prescribed and DO NOT stop taking Eliquis without talking to the doctor who prescribed the medication.  Stopping may increase your risk of developing a new blood clot.  Refill your prescription before you run out.  After discharge, you should have regular check-up appointments with your healthcare provider that is prescribing your Eliquis.    What do you do if you miss a dose? If a dose of ELIQUIS is not taken at the scheduled time, take it as soon as possible on the same day and twice-daily administration should be resumed. The dose should not be doubled to make up for a missed dose.  Important Safety Information A possible side effect of Eliquis is bleeding. You should call your healthcare provider right away if you experience any of the following: Bleeding from an injury or your nose that does not stop. Unusual colored urine (red or dark brown) or unusual colored stools (red or black). Unusual bruising for unknown reasons. A serious fall or if you hit your head (even if there is no bleeding).  Some medicines may interact with  Eliquis and might increase your risk of bleeding or clotting while on Eliquis. To help avoid this, consult your healthcare provider or pharmacist prior to using any new prescription or non-prescription medications, including herbals, vitamins, non-steroidal anti-inflammatory drugs (NSAIDs) and supplements.  This website has more information on Eliquis (apixaban): http://www.eliquis.com/eliquis/home

## 2022-11-22 NOTE — Evaluation (Signed)
Occupational Therapy Evaluation/Discharge   Patient Details Name: Victoria [name] MRN: [id number] DOB: 1941-08-01 Today's Date: 11/22/2022   History of Present Illness Pt is a 81 y.o. female presenting on 11/30 for evaluation of L lower chest pain that worsens with movement, palpation, coughing, and deep breathing. CT chest revealed acute mild to moderate amount of pulmonary embolism within the distal aspect of the R pulmonary artery. PMH significant for cirrhosis, breast cancer, anxiety, HTN, thrombocytopenia, obesity, DmII controlled and history of hepatitis C.   Clinical Impression   PTA, pt was independent in ADL and IADL. Upon eval, pt presents with continued chest pain. Pt performing ADL with mod I. Pt educated regarding energy conservation strategies that can be utilized in the home and community setting as well as compensatory techniques for comfort during ADL. Pt very interactive and collaborative with OT. Recommending discharge home with no OT follow up at this time. OT to sign off. Thank you for this order, please re-consult if change in status.       Recommendations for follow up therapy are one component of a multi-disciplinary discharge planning process, led by the attending physician.  Recommendations may be updated based on patient status, additional functional criteria and insurance authorization.   Follow Up Recommendations  No OT follow up     Assistance Recommended at Discharge PRN  Patient can return home with the following Other (comment) (as needed)    Functional Status Assessment  Patient has had a recent decline in their functional status and demonstrates the ability to make significant improvements in function in a reasonable and predictable amount of time.  Equipment Recommendations  None recommended by OT    Recommendations for Other Services       Precautions / Restrictions Precautions Precautions: Fall      Mobility Bed Mobility Overal bed  mobility: Modified Independent                  Transfers Overall transfer level: Modified independent                 General transfer comment: No AD or external assist      Balance Overall balance assessment: Modified Independent                                         ADL either performed or assessed with clinical judgement   ADL Overall ADL's : Modified independent                                       General ADL Comments: education regarding energy conservation     Vision Baseline Vision/History: 0 No visual deficits Ability to See in Adequate Light: 0 Adequate Patient Visual Report: No change from baseline Vision Assessment?: No apparent visual deficits Additional Comments: WFL for tasks assessed     Perception Perception Perception Tested?: No   Praxis Praxis Praxis tested?: Not tested    Pertinent Vitals/Pain Pain Assessment Pain Assessment: Faces Faces Pain Scale: Hurts a little bit Pain Location: chest Pain Descriptors / Indicators: Guarding, Grimacing Pain Intervention(s): Limited activity within patient's tolerance, Monitored during session, Repositioned, Relaxation     Hand Dominance Right   Extremity/Trunk Assessment Upper Extremity Assessment Upper Extremity Assessment: Overall WFL for tasks assessed   Lower Extremity Assessment Lower Extremity  Assessment: Overall WFL for tasks assessed   Cervical / Trunk Assessment Cervical / Trunk Assessment: Normal   Communication Communication Communication: No difficulties   Cognition Arousal/Alertness: Awake/alert Behavior During Therapy: WFL for tasks assessed/performed Overall Cognitive Status: Within Functional Limits for tasks assessed                                 General Comments: Pt able to recall hospital course and medical treatment received as well as describe in detail PT that has been in room. Very pleasant and  conversational. Pt son present and reporting pt's cognition seems to be at baseline     General Comments  Son present and reporting no additional concerns. Reporting he feels like evaluation was thorough    Exercises     Shoulder Instructions      Home Living Family/patient expects to be discharged to:: Private residence Living Arrangements: Alone;Other (Comment) (62 y.o. granddaughter stays with her `80+ percent of the time.) Available Help at Discharge: Family Type of Home: Other(Comment) (town home) Home Access: Level entry     Home Layout: One level     Bathroom Shower/Tub: Teacher, early years/pre: Standard     Home Equipment: Civil engineer, contracting          Prior Functioning/Environment Prior Level of Function : Independent/Modified Independent;Driving             Mobility Comments: no AD ADLs Comments: Independent in ADL and IADL, walks daily, takes granddaughter to school        OT Problem List: Decreased strength;Decreased activity tolerance;Pain      OT Treatment/Interventions:      OT Goals(Current goals can be found in the care plan section) Acute Rehab OT Goals Patient Stated Goal: no more chest pain OT Goal Formulation: With patient/family  OT Frequency:      Co-evaluation              AM-PAC OT "6 Clicks" Daily Activity     Outcome Measure Help from another person eating meals?: None Help from another person taking care of personal grooming?: None Help from another person toileting, which includes using toliet, bedpan, or urinal?: None Help from another person bathing (including washing, rinsing, drying)?: None Help from another person to put on and taking off regular upper body clothing?: None Help from another person to put on and taking off regular lower body clothing?: None 6 Click Score: 24   End of Session Equipment Utilized During Treatment: Gait belt Nurse Communication: Mobility status  Activity Tolerance: Patient  tolerated treatment well Patient left: in bed;with call bell/phone within reach;with bed alarm set;with family/visitor present  OT Visit Diagnosis: Unsteadiness on feet (R26.81);Pain;Muscle weakness (generalized) (M62.81) Pain - part of body:  (L chest area)                Time: 1004-1030 OT Time Calculation (min): 26 min Charges:  OT General Charges $OT Visit: 1 Visit OT Evaluation $OT Eval Low Complexity: 1 Low OT Treatments $Self Care/Home Management : 8-22 mins  Elder Cyphers, OTR/L [facility] Acute Rehabilitation Office: [phone]   Magnus Ivan 11/22/2022, 12:59 PM

## 2022-11-22 NOTE — Progress Notes (Signed)
Lower extremity venous duplex has been completed.   Preliminary results in CV Proc.   Jinny Blossom Tahje Borawski 11/22/2022 12:38 PM

## 2022-11-22 NOTE — Progress Notes (Signed)
ANTICOAGULATION CONSULT NOTE - Follow Up Consult  Pharmacy Consult for heparin Indication: pulmonary embolus  Allergies  Allergen Reactions   Lisinopril Cough    Patient Measurements: Height: _0  (170.2 cm) Weight: 87.1 kg (192 lb 1.6 oz) IBW/kg (Calculated) : 61.6 Heparin Dosing Weight: 81 kg  Vital Signs: Temp: 98.5 F (36.9 C) (12/01 1051) Temp Source: Oral (12/01 1051) BP: 192/99 (12/01 1051) Pulse Rate: 63 (12/01 1051)  Labs: Recent Labs    11/21/22 1018 11/21/22 1023 11/21/22 1207 11/22/22 0457 11/22/22 1208  HGB 11.8*  --   --  11.3*  --   HCT 36.1  --   --  34.7*  --   PLT 252  --   --  241  --   LABPROT  --  15.1  --   --   --   INR  --  1.2  --   --   --   HEPARINUNFRC  --   --   --  0.59 <0.10*  CREATININE 1.11*  --   --  0.98  --   TROPONINIHS 4  --  6  --   --     Estimated Creatinine Clearance: 52.8 mL/min (by C-G formula based on SCr of 0.98 mg/dL).   Medications:  Medications Prior to Admission  Medication Sig Dispense Refill Last Dose   albuterol (VENTOLIN HFA) 108 (90 Base) MCG/ACT inhaler Inhale 2 puffs into the lungs every 6 (six) hours as needed for wheezing or shortness of breath. 8 g PRN    ALPRAZolam (XANAX) 0.5 MG tablet TAKE 1 TABLET(0.5 MG) BY MOUTH TWICE DAILY AS NEEDED FOR ANXIETY 60 tablet 5    amLODipine (NORVASC) 2.5 MG tablet TAKE 1 TABLET(2.5 MG) BY MOUTH AT BEDTIME 90 tablet 1    amoxicillin-clavulanate (AUGMENTIN) 500-125 MG tablet Take 1 tablet by mouth 3 (three) times daily. 30 tablet 0    Blood Glucose Monitoring Suppl (TRUE METRIX METER) w/Device KIT USE AS DIRECTED 1 kit prn    Cetirizine HCl (ZYRTEC ALLERGY PO) Take 1 tablet by mouth daily as needed (allergies).       cholecalciferol (VITAMIN D3) 25 MCG (1000 UNIT) tablet Take 1,000 Units by mouth daily.      glucose blood (TRUE METRIX BLOOD GLUCOSE TEST) test strip TEST BLOOD SUGAR TWICE DAILY 200 strip prn    HYDROcodone-acetaminophen (NORCO) 10-325 MG tablet Take 1  tablet by mouth every 8 (eight) hours as needed for up to 5 days. 15 tablet 0    ibuprofen (ADVIL) 100 MG tablet Take 100 mg by mouth every 6 (six) hours as needed for pain or fever.      Lancets (FREESTYLE) lancets CHECK GLUCOSE TWICE A DAY 100 each 12    losartan-hydrochlorothiazide (HYZAAR) 100-25 MG tablet Take 1 tablet by mouth daily. 90 tablet 3    meclizine (ANTIVERT) 25 MG tablet Take 1 tablet (25 mg total) by mouth 3 (three) times daily as needed for dizziness. 60 tablet PRN    metFORMIN (GLUCOPHAGE) 500 MG tablet TAKE 1 TABLET(500 MG) BY MOUTH TWICE DAILY WITH A MEAL 180 tablet 2     Assessment: 81 yo F on heparin for PE.  Heparin level undetectable despite being therapeutic earlier today.  Spoke with RN, IV infusing appropriately.  No bleeding noted.  Goal of Therapy:  Heparin level 0.3-0.7 units/ml Monitor platelets by anticoagulation protocol: Yes   Plan:  Heparin 2000 units bolus x 1 Increase heparin infusion to 1550 units/hr Repeat heparin level  in 6 hours Daily heparin level and CBC Follow-up oral anticoagulation plan   Horton Chin, Pharm.D., BCPS Clinical Pharmacist Clinical phone for 11/22/2022 from 7:30-3:00 is [phone].  **Pharmacist phone directory can be found on [email or web address] listed under [place].  11/22/2022 1:11 PM

## 2022-11-22 NOTE — ED Notes (Signed)
Pt moved to room 8 at this time

## 2022-11-22 NOTE — Progress Notes (Signed)
PROGRESS NOTE    Victoria [name]  JXB:[id number] DOB: 09-23-1941 DOA: 11/21/2022 PCP: Elby Showers, MD  Chief Complaint  Patient presents with   Chest Pain    Brief Narrative:  Victoria [name] is Victoria [name] 81 y.o. female with Victoria [name] past medical history of right breast cancer, essential hypertension, diabetes mellitus type 2, previous history of pancreatitis, history of pancreatic lesion who presented with complaints of left sided chest pain.  Onset of pain was yesterday.  8 out of 10 in intensity.  Worsens with movement and with cough.  Denies any blood in the sputum.  No fever or chills.  She was recently seen by her primary care provider.  At that time she was experiencing left back musculoskeletal pain for which she was prescribed Norco.  There was also concern for sinusitis and she was prescribed Laya Letendre course of prednisone and Augmentin, starting this past Tuesday.  Patient is very anxious at this time.  Denies any nausea vomiting.  No dizziness or lightheadedness.  No previous history of blood clots.  No leg swelling.  Denies any recent road trips or air travel.   In the emergency department she was found to have acute pulmonary embolism on CT scan.  There was also concern for pancreatic lesion.  Patient will need hospitalization for further management.  Assessment & Plan:   Principal Problem:   Acute pulmonary embolism ([place]) Active Problems:   Diabetes mellitus ([place])   Hypertension   History of breast cancer   Pancreatic lesion   #1. Acute pulmonary embolism:  Unclear provoking factor Echo with normal RVSF LE [place] negative for DVT Will transition from heparin to eliquis tonight   #2. Pancreatic lesion: CT scan incidentally showed Victoria Woodyard soft tissue mass measuring about 4 x 4.7 cm in the head of the pancreas. MRCP pending.   #3.  History of breast cancer: This was in the right breast, status post lumpectomy and radiation treatment several years ago.  11/30 she raised concern about possible  lesion in the left breast.  My partner could not palpate any lesion in the left breast especially in the area where she was pointing.  Exam limited by positioning due to significant left-sided chest pain with movement.  She will need Victoria [name] full evaluation including Victoria [name] mammogram after discharge.   #4.  Diabetes mellitus type 2: Aq1c 7 05/2022 - Monitor CBGs.  SSI.  Holding her home medications for now.   #5.  Essential hypertension: Continue with amlodipine. Restart HCTZ and losartan.   #6.  Nodular appearing areas of peripheral bilateral lobe scarring and/or atelectasis noted incidentally on CT scan.  May need further evaluation in the outpatient setting.      DVT prophylaxis: eliquis Code Status: full Family Communication: none Disposition:   Status is: Inpatient Remains inpatient appropriate because: need for further w/u   Consultants:  none  Procedures:  Echo IMPRESSIONS     1. Left ventricular ejection fraction, by estimation, is 70 to 75%. The  left ventricle has hyperdynamic function. The left ventricle has no  regional wall motion abnormalities. Left ventricular diastolic parameters  are consistent with Grade I diastolic  dysfunction (impaired relaxation).   2. Right ventricular systolic function is normal. The right ventricular  size is normal. Tricuspid regurgitation signal is inadequate for assessing  PA pressure.   3. The mitral valve is normal in structure. No evidence of mitral valve  regurgitation. No evidence of mitral stenosis.   4. The aortic valve is tricuspid.  Aortic valve regurgitation is not  visualized. No aortic stenosis is present.   5. The inferior vena cava is normal in size with greater than 50%  respiratory variability, suggesting right atrial pressure of 3 mmHg.   Comparison(s): No prior Echocardiogram.   LE [place] Summary:  BILATERAL:  - No evidence of deep vein thrombosis seen in the lower extremities,  bilaterally.  -No evidence of popliteal  cyst, bilaterally.   Antimicrobials:  Anti-infectives (From admission, onward)    None       Subjective: No complaints Asking about her home meds  Objective: Vitals:   11/22/22 0551 11/22/22 0729 11/22/22 1051 11/22/22 1537  BP: (!) 166/97 (!) 168/95 (!) 192/99 129/77  Pulse: (!) 56 60 63 99  Resp: '15 18 20 18  '$ Temp:  98.3 F (36.8 C) 98.5 F (36.9 C) (!) 96.7 F (35.9 C)  TempSrc:  Oral Oral Axillary  SpO2: 98% 97% 97% 98%  Weight:      Height:        Intake/Output Summary (Last 24 hours) at 11/22/2022 1905 Last data filed at 11/22/2022 1500 Gross per 24 hour  Intake 1370.12 ml  Output 700 ml  Net 670.12 ml   Filed Weights   11/21/22 2011 11/22/22 0530  Weight: 89.7 kg 87.1 kg    Examination:  General exam: Appears calm and comfortable  Respiratory system: unlabored Cardiovascular system: rrr Gastrointestinal system: Abdomen is nondistended, soft and nontender.  Central nervous system: Alert and oriented. No focal neurological deficits. Extremities: no lee    Data Reviewed: I have personally reviewed following labs and imaging studies  CBC: Recent Labs  Lab 11/21/22 1018 11/22/22 0457  WBC 11.2* 10.6*  HGB 11.8* 11.3*  HCT 36.1 34.7*  MCV 83.2 83.4  PLT 252 967    Basic Metabolic Panel: Recent Labs  Lab 11/21/22 1018 11/22/22 0457  NA 137 139  K 4.2 3.5  CL 98 103  CO2 23 25  GLUCOSE 193* 141*  BUN 23 23  CREATININE 1.11* 0.98  CALCIUM 9.2 8.8*    GFR: Estimated Creatinine Clearance: 52.8 mL/min (by C-G formula based on SCr of 0.98 mg/dL).  Liver Function Tests: Recent Labs  Lab 11/21/22 1018  AST 20  ALT 14  ALKPHOS 64  BILITOT 0.5  PROT 7.4  ALBUMIN 3.7    CBG: Recent Labs  Lab 11/22/22 0543 11/22/22 1050 11/22/22 1544  GLUCAP 135* 133* 149*     No results found for this or any previous visit (from the past 240 hour(s)).       Radiology Studies: ECHOCARDIOGRAM COMPLETE  Result Date: 11/22/2022     ECHOCARDIOGRAM REPORT   Patient Name:   Victoria [name] Date of Exam: 11/22/2022 Medical Rec #:  [id number]         Height:       67.0 in Accession #:    [id number]        Weight:       192.1 lb Date of Birth:  1941/05/15         BSA:          1.988 m Patient Age:    25 years          BP:           192/99 mmHg Patient Gender: F                 HR:           66 bpm. Exam  Location:  Inpatient Procedure: 2D Echo, Cardiac Doppler and Color Doppler Indications:    Pulmonary Embolus I26.09  History:        Patient has no prior history of Echocardiogram examinations.                 P.E.; Risk Factors:Hypertension, Diabetes and Non-Smoker.  Sonographer:    Greer Pickerel Referring Phys: [id number] Bonnielee Haff  Sonographer Comments: Image acquisition challenging due to respiratory motion. IMPRESSIONS  1. Left ventricular ejection fraction, by estimation, is 70 to 75%. The left ventricle has hyperdynamic function. The left ventricle has no regional wall motion abnormalities. Left ventricular diastolic parameters are consistent with Grade I diastolic dysfunction (impaired relaxation).  2. Right ventricular systolic function is normal. The right ventricular size is normal. Tricuspid regurgitation signal is inadequate for assessing PA pressure.  3. The mitral valve is normal in structure. No evidence of mitral valve regurgitation. No evidence of mitral stenosis.  4. The aortic valve is tricuspid. Aortic valve regurgitation is not visualized. No aortic stenosis is present.  5. The inferior vena cava is normal in size with greater than 50% respiratory variability, suggesting right atrial pressure of 3 mmHg. Comparison(s): No prior Echocardiogram. FINDINGS  Left Ventricle: Left ventricular ejection fraction, by estimation, is 70 to 75%. The left ventricle has hyperdynamic function. The left ventricle has no regional wall motion abnormalities. The left ventricular internal cavity size was normal in size. There is no left ventricular  hypertrophy. Left ventricular diastolic parameters are consistent with Grade I diastolic dysfunction (impaired relaxation). Right Ventricle: The right ventricular size is normal. Right ventricular systolic function is normal. Tricuspid regurgitation signal is inadequate for assessing PA pressure. The tricuspid regurgitant velocity is 1.12 m/s, and with an assumed right atrial  pressure of 3 mmHg, the estimated right ventricular systolic pressure is 8.0 mmHg. Left Atrium: Left atrial size was normal in size. Right Atrium: Right atrial size was normal in size. Pericardium: There is no evidence of pericardial effusion. Mitral Valve: The mitral valve is normal in structure. Mild mitral annular calcification. No evidence of mitral valve regurgitation. No evidence of mitral valve stenosis. Tricuspid Valve: The tricuspid valve is normal in structure. Tricuspid valve regurgitation is trivial. No evidence of tricuspid stenosis. Aortic Valve: The aortic valve is tricuspid. Aortic valve regurgitation is not visualized. No aortic stenosis is present. Pulmonic Valve: The pulmonic valve was normal in structure. Pulmonic valve regurgitation is not visualized. No evidence of pulmonic stenosis. Aorta: The aortic root is normal in size and structure. Venous: The inferior vena cava is normal in size with greater than 50% respiratory variability, suggesting right atrial pressure of 3 mmHg. IAS/Shunts: No atrial level shunt detected by color flow Doppler.  LEFT VENTRICLE PLAX 2D LVIDd:         3.90 cm   Diastology LVIDs:         2.20 cm   LV e' medial:    4.97 cm/s LV PW:         1.00 cm   LV E/e' medial:  13.0 LV IVS:        1.10 cm   LV e' lateral:   9.01 cm/s LVOT diam:     2.10 cm   LV E/e' lateral: 7.2 LV SV:         76 LV SV Index:   38 LVOT Area:     3.46 cm  RIGHT VENTRICLE RV S prime:     11.30 cm/s TAPSE (M-mode): 1.8  cm LEFT ATRIUM           Index        RIGHT ATRIUM           Index LA diam:      2.40 cm 1.21 cm/m   RA  Area:     10.70 cm LA Vol (A2C): 74.5 ml 37.48 ml/m  RA Volume:   19.50 ml  9.81 ml/m LA Vol (A4C): 52.2 ml 26.26 ml/m  AORTIC VALVE LVOT Vmax:   97.70 cm/s LVOT Vmean:  67.300 cm/s LVOT VTI:    0.219 m  AORTA Ao Root diam: 3.40 cm Ao Asc diam:  3.20 cm MITRAL VALVE               TRICUSPID VALVE MV Area (PHT): 2.80 cm    TR Peak grad:   5.0 mmHg MV Decel Time: 271 msec    TR Vmax:        112.00 cm/s MV E velocity: 64.70 cm/s MV Yanni Ruberg velocity: 88.70 cm/s  SHUNTS MV E/Zenon Leaf ratio:  0.73        Systemic VTI:  0.22 m                            Systemic Diam: 2.10 cm Kirk Ruths MD Electronically signed by Kirk Ruths MD Signature Date/Time: 11/22/2022/2:43:53 PM    Final    VAS [place] LOWER EXTREMITY VENOUS (DVT)  Result Date: 11/22/2022  Lower Venous DVT Study Patient Name:  Victoria [name]  Date of Exam:   11/22/2022 Medical Rec #: [id number]          Accession #:    [id number] Date of Birth: 1940/12/31          Patient Gender: F Patient Age:   48 years Exam Location:  [facility] Procedure:      VAS [place] LOWER EXTREMITY VENOUS (DVT) Referring Phys: Bonnielee Haff --------------------------------------------------------------------------------  Indications: Pulmonary embolism.  Comparison Study: no prior Performing Technologist: Archie Patten RVS  Examination Guidelines: Shyenne Maggard complete evaluation includes B-mode imaging, spectral Doppler, color Doppler, and power Doppler as needed of all accessible portions of each vessel. Bilateral testing is considered an integral part of Lauri Till complete examination. Limited examinations for reoccurring indications may be performed as noted. The reflux portion of the exam is performed with the patient in reverse Trendelenburg.  +---------+---------------+---------+-----------+----------+--------------+ RIGHT    CompressibilityPhasicitySpontaneityPropertiesThrombus Aging +---------+---------------+---------+-----------+----------+--------------+ CFV      Full           Yes       Yes                                 +---------+---------------+---------+-----------+----------+--------------+ SFJ      Full                                                        +---------+---------------+---------+-----------+----------+--------------+ FV Prox  Full                                                        +---------+---------------+---------+-----------+----------+--------------+  FV Mid   Full                                                        +---------+---------------+---------+-----------+----------+--------------+ FV DistalFull                                                        +---------+---------------+---------+-----------+----------+--------------+ PFV      Full                                                        +---------+---------------+---------+-----------+----------+--------------+ POP      Full           Yes      Yes                                 +---------+---------------+---------+-----------+----------+--------------+ PTV      Full                                                        +---------+---------------+---------+-----------+----------+--------------+ PERO     Full                                                        +---------+---------------+---------+-----------+----------+--------------+   +---------+---------------+---------+-----------+----------+--------------+ LEFT     CompressibilityPhasicitySpontaneityPropertiesThrombus Aging +---------+---------------+---------+-----------+----------+--------------+ CFV      Full           Yes      Yes                                 +---------+---------------+---------+-----------+----------+--------------+ SFJ      Full                                                        +---------+---------------+---------+-----------+----------+--------------+ FV Prox  Full                                                         +---------+---------------+---------+-----------+----------+--------------+ FV Mid   Full                                                        +---------+---------------+---------+-----------+----------+--------------+  FV DistalFull                                                        +---------+---------------+---------+-----------+----------+--------------+ PFV      Full                                                        +---------+---------------+---------+-----------+----------+--------------+ POP      Full           Yes      Yes                                 +---------+---------------+---------+-----------+----------+--------------+ PTV      Full                                                        +---------+---------------+---------+-----------+----------+--------------+ PERO     Full           Yes      Yes                                 +---------+---------------+---------+-----------+----------+--------------+     Summary: BILATERAL: - No evidence of deep vein thrombosis seen in the lower extremities, bilaterally. -No evidence of popliteal cyst, bilaterally.   *See table(s) above for measurements and observations.    Preliminary    CT Angio Chest PE W and/or Wo Contrast  Result Date: 11/21/2022 CLINICAL DATA:  Pulmonary embolism. EXAM: CT ANGIOGRAPHY CHEST WITH CONTRAST TECHNIQUE: Multidetector CT imaging of the chest was performed using the standard protocol during bolus administration of intravenous contrast. Multiplanar CT image reconstructions and MIPs were obtained to evaluate the vascular anatomy. RADIATION DOSE REDUCTION: This exam was performed according to the departmental dose-optimization program which includes automated exposure control, adjustment of the mA and/or kV according to patient size and/or use of iterative reconstruction technique. CONTRAST:  18m OMNIPAQUE IOHEXOL 350 MG/ML SOLN COMPARISON:  None Available. FINDINGS:  Cardiovascular: There is moderate severity calcification of the aortic arch, without evidence of aortic aneurysm. Satisfactory opacification of the pulmonary arteries to the segmental level. Brynnleigh Mcelwee mild-to-moderate amount of pulmonary embolism is seen within the distal aspect of the right pulmonary artery. Extension into the proximal portions of the lower lobe branches of the right pulmonary artery is noted. Normal heart size with right heart strain (RV/LV ratio of 1.28). No pericardial effusion. Mediastinum/Nodes: No enlarged mediastinal, hilar, or axillary lymph nodes. The left lobe of the thyroid gland is enlarged and heterogeneous in appearance. The trachea and esophagus demonstrate no significant findings. Lungs/Pleura: Mild, slightly nodular appearing areas of focal scarring and/or atelectasis are seen along the periphery of both lungs. This is slightly more prominent within the bilateral lower lobes. There is no evidence of Victoria Tapanes pleural effusion or pneumothorax. Upper Abdomen: Multiple surgical clips are seen within the gallbladder fossa. Victoria [name] 4.0 cm x 4.7 cm  heterogeneous soft tissue mass is suspected within the region of the head of the pancreas. This area is limited in evaluation in the absence of intravenous contrast. Noninflamed diverticula are seen throughout the large bowel. Musculoskeletal: Diffusely sclerotic vertebral bodies are seen with multilevel degenerative changes noted throughout the thoracic spine. Review of the MIP images confirms the above findings. IMPRESSION: 1. Mild-to-moderate amount of pulmonary embolism within the distal aspect of the right pulmonary artery. 2. Right heart strain with an RV/LV ratio of 1.28. 3. Mild, slightly nodular appearing areas of peripheral, bilateral lobe scarring and/or atelectasis. 4. Colonic diverticulosis. 5. Findings may represent Victoria Chaidez soft tissue mass within the region of the head of the pancreas. Further evaluation with MRI is recommended, as an underlying neoplastic  process cannot be excluded. 6. Aortic atherosclerosis. Aortic Atherosclerosis (ICD10-I70.0). Electronically Signed   By: Victoria [name] M.D.   On: 11/21/2022 19:40   DG Chest 2 View  Result Date: 11/21/2022 CLINICAL DATA:  Chest pain. EXAM: CHEST - 2 VIEW COMPARISON:  November 12, 2021. FINDINGS: The heart size and mediastinal contours are within normal limits. Both lungs are clear. The visualized skeletal structures are unremarkable. IMPRESSION: No active cardiopulmonary disease. Electronically Signed   By: Marijo Conception M.D.   On: 11/21/2022 10:40        Scheduled Meds:  amLODipine  2.5 mg Oral QHS   insulin aspart  0-15 Units Subcutaneous TID WC   losartan-hydrochlorothiazide  1 tablet Oral Daily   Continuous Infusions:  heparin 1,550 Units/hr (11/22/22 1318)     LOS: 1 day    Time spent: over 30 min     Fayrene Helper, MD [facility]   To contact the attending provider between 7A-7P or the covering provider during after hours 7P-7A, please log into the web site [email or web address] and access using universal [place] password for that web site. If you do not have the password, please call the hospital operator.  11/22/2022, 7:05 PM

## 2022-11-22 NOTE — ED Notes (Signed)
Verbal report given to Hennie Duos RN at this time

## 2022-11-22 NOTE — Progress Notes (Signed)
PT Cancellation Note  Patient Details Name: Victoria [name] MRN: [id number] DOB: 14-Jan-1941   Cancelled Treatment:    Reason Eval/Treat Not Completed: Other (comment) pt politely declined stating she would like more time to talk with her visitors before attempting PT evaluation. Will continue to attempt evaluation this morning.   West Carbo, PT, DPT   Acute Rehabilitation Department   Sandra Cockayne 11/22/2022, 9:52 AM

## 2022-11-22 NOTE — Progress Notes (Signed)
PT Cancellation Note  Patient Details Name: Victoria [name] MRN: [id number] DOB: 03/24/1941   Cancelled Treatment:    Reason Eval/Treat Not Completed: PT screened, no needs identified, will sign off. Pt reports she is at baseline mobility and has no acute PT needs. Please feel free to consult if change in status or mobility.   West Carbo, PT, DPT   Acute Rehabilitation Department   Sandra Cockayne 11/22/2022, 12:12 PM

## 2022-11-22 NOTE — Progress Notes (Signed)
  Echocardiogram 2D Echocardiogram has been performed.  Victoria [name] 11/22/2022, 2:25 PM

## 2022-11-22 NOTE — Progress Notes (Signed)
[place] for heparin Indication: pulmonary embolus Brief A/P: Heparin level within goal range Continue Heparin at current rate   Allergies  Allergen Reactions   Lisinopril Cough    Patient Measurements: Height: '5\' 7"'$  (170.2 cm) Weight: 87.1 kg (192 lb 1.6 oz) IBW/kg (Calculated) : 61.6 Heparin Dosing Weight: 81kg  Vital Signs: Temp: 97.8 F (36.6 C) (12/01 0530) Temp Source: Oral (12/01 0530) BP: 166/97 (12/01 0551) Pulse Rate: 56 (12/01 0551)  Labs: Recent Labs    11/21/22 1018 11/21/22 1023 11/21/22 1207 11/22/22 0457  HGB 11.8*  --   --  11.3*  HCT 36.1  --   --  34.7*  PLT 252  --   --  241  LABPROT  --  15.1  --   --   INR  --  1.2  --   --   HEPARINUNFRC  --   --   --  0.59  CREATININE 1.11*  --   --  0.98  TROPONINIHS 4  --  6  --      Estimated Creatinine Clearance: 52.8 mL/min (by C-G formula based on SCr of 0.98 mg/dL).  Assessment: 81 y.o. female with PE for heparin  Goal of Therapy:  Heparin level 0.3-0.7 units/ml Monitor platelets by anticoagulation protocol: Yes   Plan:  Continue Heparin at current rate  Check heparin level in 6 hours to verify  Phillis Knack, PharmD, BCPS

## 2022-11-23 DIAGNOSIS — I2699 Other pulmonary embolism without acute cor pulmonale: Secondary | ICD-10-CM | POA: Diagnosis not present

## 2022-11-23 LAB — CBC WITH DIFFERENTIAL/PLATELET
Abs Immature Granulocytes: 0.02 10*3/uL (ref 0.00–0.07)
Basophils Absolute: 0 10*3/uL (ref 0.0–0.1)
Basophils Relative: 0 %
Eosinophils Absolute: 0 10*3/uL (ref 0.0–0.5)
Eosinophils Relative: 1 %
HCT: 33.1 % — ABNORMAL LOW (ref 36.0–46.0)
Hemoglobin: 11 g/dL — ABNORMAL LOW (ref 12.0–15.0)
Immature Granulocytes: 0 %
Lymphocytes Relative: 36 %
Lymphs Abs: 3.1 10*3/uL (ref 0.7–4.0)
MCH: 27.2 pg (ref 26.0–34.0)
MCHC: 33.2 g/dL (ref 30.0–36.0)
MCV: 81.9 fL (ref 80.0–100.0)
Monocytes Absolute: 0.8 10*3/uL (ref 0.1–1.0)
Monocytes Relative: 9 %
Neutro Abs: 4.7 10*3/uL (ref 1.7–7.7)
Neutrophils Relative %: 54 %
Platelets: 246 10*3/uL (ref 150–400)
RBC: 4.04 MIL/uL (ref 3.87–5.11)
RDW: 15.1 % (ref 11.5–15.5)
WBC: 8.7 10*3/uL (ref 4.0–10.5)
nRBC: 0 % (ref 0.0–0.2)

## 2022-11-23 LAB — COMPREHENSIVE METABOLIC PANEL
ALT: 13 U/L (ref 0–44)
AST: 15 U/L (ref 15–41)
Albumin: 3.2 g/dL — ABNORMAL LOW (ref 3.5–5.0)
Alkaline Phosphatase: 55 U/L (ref 38–126)
Anion gap: 10 (ref 5–15)
BUN: 21 mg/dL (ref 8–23)
CO2: 24 mmol/L (ref 22–32)
Calcium: 8.6 mg/dL — ABNORMAL LOW (ref 8.9–10.3)
Chloride: 102 mmol/L (ref 98–111)
Creatinine, Ser: 1.04 mg/dL — ABNORMAL HIGH (ref 0.44–1.00)
GFR, Estimated: 55 mL/min — ABNORMAL LOW (ref >60)
Glucose, Bld: 202 mg/dL — ABNORMAL HIGH (ref 70–99)
Potassium: 3.3 mmol/L — ABNORMAL LOW (ref 3.5–5.1)
Sodium: 136 mmol/L (ref 135–145)
Total Bilirubin: 0.5 mg/dL (ref 0.3–1.2)
Total Protein: 6.5 g/dL (ref 6.5–8.1)

## 2022-11-23 LAB — GLUCOSE, CAPILLARY
Glucose-Capillary: 168 mg/dL — ABNORMAL HIGH (ref 70–99)
Glucose-Capillary: 171 mg/dL — ABNORMAL HIGH (ref 70–99)
Glucose-Capillary: 178 mg/dL — ABNORMAL HIGH (ref 70–99)
Glucose-Capillary: 259 mg/dL — ABNORMAL HIGH (ref 70–99)

## 2022-11-23 LAB — MAGNESIUM: Magnesium: 1.8 mg/dL (ref 1.7–2.4)

## 2022-11-23 LAB — PHOSPHORUS: Phosphorus: 3.3 mg/dL (ref 2.5–4.6)

## 2022-11-23 MED ORDER — POTASSIUM CHLORIDE CRYS ER 20 MEQ PO TBCR
40.0000 meq | EXTENDED_RELEASE_TABLET | ORAL | Status: AC
  Administered 2022-11-23 (×2): 40 meq via ORAL
  Filled 2022-11-23 (×2): qty 2

## 2022-11-23 MED ORDER — HEPARIN (PORCINE) 25000 UT/250ML-% IV SOLN
1400.0000 [IU]/h | INTRAVENOUS | Status: DC
  Administered 2022-11-23 – 2022-11-24 (×2): 1550 [IU]/h via INTRAVENOUS
  Filled 2022-11-23 (×2): qty 250

## 2022-11-23 MED ORDER — GADOBUTROL 1 MMOL/ML IV SOLN
9.0000 mL | Freq: Once | INTRAVENOUS | Status: AC | PRN
  Administered 2022-11-23: 9 mL via INTRAVENOUS

## 2022-11-23 NOTE — Progress Notes (Signed)
PROGRESS NOTE    CATALEIA GADE  BMW:[id number] DOB: 30-Aug-1941 DOA: 11/21/2022 PCP: Elby Showers, MD  Chief Complaint  Patient presents with   Chest Pain    Brief Narrative:  AIKO BELKO is Kamari Bilek 81 y.o. female with Yousuf Ager past medical history of right breast cancer, essential hypertension, diabetes mellitus type 2, previous history of pancreatitis, history of pancreatic lesion who presented with complaints of left sided chest pain.  Onset of pain was yesterday.  8 out of 10 in intensity.  Worsens with movement and with cough.  Denies any blood in the sputum.  No fever or chills.  She was recently seen by her primary care provider.  At that time she was experiencing left back musculoskeletal pain for which she was prescribed Norco.  There was also concern for sinusitis and she was prescribed Roselani Grajeda course of prednisone and Augmentin, starting this past Tuesday.  Patient is very anxious at this time.  Denies any nausea vomiting.  No dizziness or lightheadedness.  No previous history of blood clots.  No leg swelling.  Denies any recent road trips or air travel.   In the emergency department she was found to have acute pulmonary embolism on CT scan.  There was also concern for pancreatic lesion.  Patient will need hospitalization for further management.  Assessment & Plan:   Principal Problem:   Acute pulmonary embolism ([place]) Active Problems:   Diabetes mellitus ([place])   Hypertension   History of breast cancer   Pancreatic lesion   #1. Submassive Pulmonary Embolism:  Unclear provoking factor Echo with normal RVSF LE [place] negative for DVT Needs 3-6 months anticoagulation.  With concern for malignancy, this needs to be revisited, high likelihood she will be indefinte anticoagulation candidate. Given abnormal MRCP and recommendation for EUS and possible bx, will restart heparin gtt and ask GI to eval for possible procedure.  I think coordinating procedure as outpatient on anticoagulation would be  difficult.   #2. Concern for Pancreatic Mass  Concern for Metastatic Lymphadenopathy Abnormal MRCP, rads recommended further evaluation with endoscopic [place] and tissue sampling in the near future - given her need for anticoagulation for the submassive PE, this would be difficult to coordinate outpatient as she really needs uninterrupted anticoagulation for her submassive PE.  Will ask GI if this could be pursued while she's here on Royden Bulman heparin gtt.  Last dose of eliquis 12/2 AM.   #3.  History of breast cancer: This was in the right breast, status post lumpectomy and radiation treatment several years ago.  11/30 she raised concern about possible lesion in the left breast.  My partner could not palpate any lesion in the left breast especially in the area where she was pointing.  Exam limited by positioning due to significant left-sided chest pain with movement.  She will need Laekyn Rayos full evaluation including Texanna Hilburn mammogram after discharge.   #4.  Diabetes mellitus type 2: Aq1c 7 05/2022 - Monitor CBGs.  SSI.  Holding her home medications for now.   #5.  Essential hypertension: Continue with amlodipine. Restart HCTZ and losartan.   #6.  Nodular appearing areas of peripheral bilateral lobe scarring and/or atelectasis noted incidentally on CT scan.  May need further evaluation in the outpatient setting.      DVT prophylaxis: eliquis Code Status: full Family Communication: none Disposition:   Status is: Inpatient Remains inpatient appropriate because: need for further w/u   Consultants:  none  Procedures:  Echo IMPRESSIONS  1. Left ventricular ejection fraction, by estimation, is 70 to 75%. The  left ventricle has hyperdynamic function. The left ventricle has no  regional wall motion abnormalities. Left ventricular diastolic parameters  are consistent with Grade I diastolic  dysfunction (impaired relaxation).   2. Right ventricular systolic function is normal. The right ventricular  size is  normal. Tricuspid regurgitation signal is inadequate for assessing  PA pressure.   3. The mitral valve is normal in structure. No evidence of mitral valve  regurgitation. No evidence of mitral stenosis.   4. The aortic valve is tricuspid. Aortic valve regurgitation is not  visualized. No aortic stenosis is present.   5. The inferior vena cava is normal in size with greater than 50%  respiratory variability, suggesting right atrial pressure of 3 mmHg.   Comparison(s): No prior Echocardiogram.   LE [place] Summary:  BILATERAL:  - No evidence of deep vein thrombosis seen in the lower extremities,  bilaterally.  -No evidence of popliteal cyst, bilaterally.   Antimicrobials:  Anti-infectives (From admission, onward)    None       Subjective: Feels better today  Objective: Vitals:   11/22/22 1537 11/22/22 2023 11/23/22 0452 11/23/22 1744  BP: 129/77 (!) 166/90 (!) 120/101 (!) 143/69  Pulse: 99  68 78  Resp: '18  16 18  '$ Temp: (!) 96.7 F (35.9 C) 98.4 F (36.9 C) 98.7 F (37.1 C) 99.1 F (37.3 C)  TempSrc: Axillary Oral Oral Oral  SpO2: 98%  99%   Weight:   86.9 kg   Height:        Intake/Output Summary (Last 24 hours) at 11/23/2022 1936 Last data filed at 11/23/2022 3825 Gross per 24 hour  Intake 440 ml  Output 500 ml  Net -60 ml   Filed Weights   11/21/22 2011 11/22/22 0530 11/23/22 0452  Weight: 89.7 kg 87.1 kg 86.9 kg    Examination:  General: No acute distress. Lungs: unlabored Neurological: Alert and oriented 3. Moves all extremities 4 with equal strength. Cranial nerves II through XII grossly intact. Extremities: No clubbing or cyanosis. No edema.   Data Reviewed: I have personally reviewed following labs and imaging studies  CBC: Recent Labs  Lab 11/21/22 1018 11/22/22 0457 11/23/22 0039  WBC 11.2* 10.6* 8.7  NEUTROABS  --   --  4.7  HGB 11.8* 11.3* 11.0*  HCT 36.1 34.7* 33.1*  MCV 83.2 83.4 81.9  PLT 252 241 053    Basic Metabolic  Panel: Recent Labs  Lab 11/21/22 1018 11/22/22 0457 11/23/22 0039  NA 137 139 136  K 4.2 3.5 3.3*  CL 98 103 102  CO2 '23 25 24  '$ GLUCOSE 193* 141* 202*  BUN '23 23 21  '$ CREATININE 1.11* 0.98 1.04*  CALCIUM 9.2 8.8* 8.6*  MG  --   --  1.8  PHOS  --   --  3.3    GFR: Estimated Creatinine Clearance: 49.6 mL/min (Michel Hendon) (by C-G formula based on SCr of 1.04 mg/dL (H)).  Liver Function Tests: Recent Labs  Lab 11/21/22 1018 11/23/22 0039  AST 20 15  ALT 14 13  ALKPHOS 64 55  BILITOT 0.5 0.5  PROT 7.4 6.5  ALBUMIN 3.7 3.2*    CBG: Recent Labs  Lab 11/22/22 1544 11/22/22 2105 11/23/22 0627 11/23/22 1143 11/23/22 1548  GLUCAP 149* 139* 168* 171* 178*     No results found for this or any previous visit (from the past 240 hour(s)).  Radiology Studies: VAS [place] LOWER EXTREMITY VENOUS (DVT)  Result Date: 11/23/2022  Lower Venous DVT Study Patient Name:  HAYLIN CAMILLI  Date of Exam:   11/22/2022 Medical Rec #: [id number]          Accession #:    [id number] Date of Birth: August 30, 1941          Patient Gender: F Patient Age:   76 years Exam Location:  [facility] Procedure:      VAS [place] LOWER EXTREMITY VENOUS (DVT) Referring Phys: Bonnielee Haff --------------------------------------------------------------------------------  Indications: Pulmonary embolism.  Comparison Study: no prior Performing Technologist: Archie Patten RVS  Examination Guidelines: Mahi Zabriskie complete evaluation includes B-mode imaging, spectral Doppler, color Doppler, and power Doppler as needed of all accessible portions of each vessel. Bilateral testing is considered an integral part of Cashe Gatt complete examination. Limited examinations for reoccurring indications may be performed as noted. The reflux portion of the exam is performed with the patient in reverse Trendelenburg.  +---------+---------------+---------+-----------+----------+--------------+ RIGHT     CompressibilityPhasicitySpontaneityPropertiesThrombus Aging +---------+---------------+---------+-----------+----------+--------------+ CFV      Full           Yes      Yes                                 +---------+---------------+---------+-----------+----------+--------------+ SFJ      Full                                                        +---------+---------------+---------+-----------+----------+--------------+ FV Prox  Full                                                        +---------+---------------+---------+-----------+----------+--------------+ FV Mid   Full                                                        +---------+---------------+---------+-----------+----------+--------------+ FV DistalFull                                                        +---------+---------------+---------+-----------+----------+--------------+ PFV      Full                                                        +---------+---------------+---------+-----------+----------+--------------+ POP      Full           Yes      Yes                                 +---------+---------------+---------+-----------+----------+--------------+ PTV  Full                                                        +---------+---------------+---------+-----------+----------+--------------+ PERO     Full                                                        +---------+---------------+---------+-----------+----------+--------------+   +---------+---------------+---------+-----------+----------+--------------+ LEFT     CompressibilityPhasicitySpontaneityPropertiesThrombus Aging +---------+---------------+---------+-----------+----------+--------------+ CFV      Full           Yes      Yes                                 +---------+---------------+---------+-----------+----------+--------------+ SFJ      Full                                                         +---------+---------------+---------+-----------+----------+--------------+ FV Prox  Full                                                        +---------+---------------+---------+-----------+----------+--------------+ FV Mid   Full                                                        +---------+---------------+---------+-----------+----------+--------------+ FV DistalFull                                                        +---------+---------------+---------+-----------+----------+--------------+ PFV      Full                                                        +---------+---------------+---------+-----------+----------+--------------+ POP      Full           Yes      Yes                                 +---------+---------------+---------+-----------+----------+--------------+ PTV      Full                                                        +---------+---------------+---------+-----------+----------+--------------+  PERO     Full           Yes      Yes                                 +---------+---------------+---------+-----------+----------+--------------+     Summary: BILATERAL: - No evidence of deep vein thrombosis seen in the lower extremities, bilaterally. -No evidence of popliteal cyst, bilaterally.   *See table(s) above for measurements and observations. Electronically signed by Orlie Pollen on 11/23/2022 at 1:25:52 PM.    Final    MR ABDOMEN MRCP W WO CONTAST  Result Date: 11/23/2022 CLINICAL DATA:  81 year old female with abnormal appearance of the pancreas on prior chest CTA examination. Follow-up study. EXAM: MRI ABDOMEN WITHOUT AND WITH CONTRAST (INCLUDING MRCP) TECHNIQUE: Multiplanar multisequence MR imaging of the abdomen was performed both before and after the administration of intravenous contrast. Heavily T2-weighted images of the biliary and pancreatic ducts were obtained, and three-dimensional MRCP images were  rendered by post processing. CONTRAST:  38m GADAVIST GADOBUTROL 1 MMOL/ML IV SOLN COMPARISON:  Abdominal MRI 11/14/2014.  Chest CTA 11/21/2022. FINDINGS: Comment: Portions of today's examination are limited by considerable patient motion. Lower chest: Unremarkable. Hepatobiliary: Mild diffuse loss of signal intensity throughout the hepatic parenchyma on out of phase dual echo images, indicative of Wynne Jury background of mild hepatic steatosis. Liver has Jordin Vicencio shrunken appearance and nodular contour, indicative of underlying cirrhosis. During arterial phase imaging there are several ill-defined peripheral predominant areas of mild hyperenhancement scattered throughout all aspects of the liver, which are not associated with signal abnormalities on precontrast imaging, not associated with diffusion restriction, and completely normalize in appearance on later post gadolinium imaging, most compatible with benign perfusion anomalies (transient hepatic intensity differences (THIDs)). No other suspicious appearing hepatic lesions are noted. Status post cholecystectomy. No intrahepatic biliary ductal dilatation. Common bile duct measures 9 mm within the porta hepatis, within normal limits given the patient's advanced age and post cholecystectomy status. No filling defect in the common bile duct to suggest choledocholithiasis. Pancreas: Increased number and size of what are now innumerable small T1 hypointense and T2 hyperintense lesions scattered throughout the pancreas, largest of which is in the anterior aspect of the neck of the pancreas (axial image 28 of series 4) measuring 1.7 x 1.3 cm. MRCP images demonstrate severe dilatation of the main pancreatic duct, which currently measures up to 1.4 cm in diameter in the body of the pancreas. There is abrupt cut off of the duct in the region of the pancreatic neck best appreciated on axial image 26 of series 4 and coronal image 25 of series 5. No discrete pancreatic lesion confidently  identified in this region (portions of today's examination are substantially limited by patient motion), however, there does appear to be [place] Inthavong small focus of diffusion restriction in this region (axial image 93 of series 9). No peripancreatic fluid collections or inflammatory changes. Spleen:  Unremarkable. Adrenals/Urinary Tract: T1 hypointense, T2 hyperintense, nonenhancing lesions in both kidneys are compatible with simple (Bosniak class 1) cysts (no imaging follow-up recommended), measuring up to 2.1 cm in the lower pole of the left kidney. No aggressive appearing renal lesions are noted. No hydroureteronephrosis in the visualized portions of the abdomen. Bilateral adrenal glands are normal in appearance. Stomach/Bowel: Innumerable colonic diverticuli are noted in the visualized portions of the colon. Vascular/Lymphatic: Aortic atherosclerosis, without definite aneurysm in the abdominal vasculature. Multiple borderline enlarged and  enlarged lymph nodes are noted in the upper abdomen and retroperitoneum, many of which demonstrate restricted diffusion. The largest of these are in the region of the hepatoduodenal ligament and portacaval nodal stations measuring up to 1.9 cm in short axis (axial image 35 of series 22). The largest retroperitoneal lymph node is in the left para-aortic nodal station (axial image 66 of series 22) measuring 1 cm in short axis. Other: No significant volume of ascites noted in the visualized portions of the peritoneal cavity. Musculoskeletal: No aggressive appearing osseous lesions are noted in the visualized portions of the skeleton. IMPRESSION: 1. Marked change in the appearance of the pancreas, now characterized by diffuse ductal dilatation throughout the body and tail of the pancreas with abrupt cut off of the duct in the region of the pancreatic neck where there is Lanya Bucks small focus of diffusion restriction. Although no discrete obstructing pancreatic mass is confidently identified on  today's motion limited examination, an obstructing lesion is strongly suspected. Further evaluation with endoscopic ultrasound and potential tissue sampling is strongly recommended in the near future to better evaluate these findings. 2. Multiple prominent borderline enlarged and enlarged retroperitoneal and upper abdominal lymph nodes, most evident in the hepato duodenal ligament, many of which demonstrate diffusion restriction, concerning for metastatic lymphadenopathy. 3. Morphologic changes in the liver indicative of underlying cirrhosis. No aggressive appearing hepatic lesions are noted on today's examination. 4. Severe colonic diverticulosis. 5. Additional incidental findings, as above. Electronically Signed   By: Vinnie Langton M.D.   On: 11/23/2022 08:21   ECHOCARDIOGRAM COMPLETE  Result Date: 11/22/2022    ECHOCARDIOGRAM REPORT   Patient Name:   ALEKA TWITTY Date of Exam: 11/22/2022 Medical Rec #:  [id number]         Height:       67.0 in Accession #:    [id number]        Weight:       192.1 lb Date of Birth:  07-21-1941         BSA:          1.988 m Patient Age:    6 years          BP:           192/99 mmHg Patient Gender: F                 HR:           66 bpm. Exam Location:  Inpatient Procedure: 2D Echo, Cardiac Doppler and Color Doppler Indications:    Pulmonary Embolus I26.09  History:        Patient has no prior history of Echocardiogram examinations.                 P.E.; Risk Factors:Hypertension, Diabetes and Non-Smoker.  Sonographer:    Greer Pickerel Referring Phys: [id number] Bonnielee Haff  Sonographer Comments: Image acquisition challenging due to respiratory motion. IMPRESSIONS  1. Left ventricular ejection fraction, by estimation, is 70 to 75%. The left ventricle has hyperdynamic function. The left ventricle has no regional wall motion abnormalities. Left ventricular diastolic parameters are consistent with Grade I diastolic dysfunction (impaired relaxation).  2. Right ventricular  systolic function is normal. The right ventricular size is normal. Tricuspid regurgitation signal is inadequate for assessing PA pressure.  3. The mitral valve is normal in structure. No evidence of mitral valve regurgitation. No evidence of mitral stenosis.  4. The aortic valve is tricuspid. Aortic valve regurgitation is not visualized. No aortic  stenosis is present.  5. The inferior vena cava is normal in size with greater than 50% respiratory variability, suggesting right atrial pressure of 3 mmHg. Comparison(s): No prior Echocardiogram. FINDINGS  Left Ventricle: Left ventricular ejection fraction, by estimation, is 70 to 75%. The left ventricle has hyperdynamic function. The left ventricle has no regional wall motion abnormalities. The left ventricular internal cavity size was normal in size. There is no left ventricular hypertrophy. Left ventricular diastolic parameters are consistent with Grade I diastolic dysfunction (impaired relaxation). Right Ventricle: The right ventricular size is normal. Right ventricular systolic function is normal. Tricuspid regurgitation signal is inadequate for assessing PA pressure. The tricuspid regurgitant velocity is 1.12 m/s, and with an assumed right atrial  pressure of 3 mmHg, the estimated right ventricular systolic pressure is 8.0 mmHg. Left Atrium: Left atrial size was normal in size. Right Atrium: Right atrial size was normal in size. Pericardium: There is no evidence of pericardial effusion. Mitral Valve: The mitral valve is normal in structure. Mild mitral annular calcification. No evidence of mitral valve regurgitation. No evidence of mitral valve stenosis. Tricuspid Valve: The tricuspid valve is normal in structure. Tricuspid valve regurgitation is trivial. No evidence of tricuspid stenosis. Aortic Valve: The aortic valve is tricuspid. Aortic valve regurgitation is not visualized. No aortic stenosis is present. Pulmonic Valve: The pulmonic valve was normal in  structure. Pulmonic valve regurgitation is not visualized. No evidence of pulmonic stenosis. Aorta: The aortic root is normal in size and structure. Venous: The inferior vena cava is normal in size with greater than 50% respiratory variability, suggesting right atrial pressure of 3 mmHg. IAS/Shunts: No atrial level shunt detected by color flow Doppler.  LEFT VENTRICLE PLAX 2D LVIDd:         3.90 cm   Diastology LVIDs:         2.20 cm   LV e' medial:    4.97 cm/s LV PW:         1.00 cm   LV E/e' medial:  13.0 LV IVS:        1.10 cm   LV e' lateral:   9.01 cm/s LVOT diam:     2.10 cm   LV E/e' lateral: 7.2 LV SV:         76 LV SV Index:   38 LVOT Area:     3.46 cm  RIGHT VENTRICLE RV S prime:     11.30 cm/s TAPSE (M-mode): 1.8 cm LEFT ATRIUM           Index        RIGHT ATRIUM           Index LA diam:      2.40 cm 1.21 cm/m   RA Area:     10.70 cm LA Vol (A2C): 74.5 ml 37.48 ml/m  RA Volume:   19.50 ml  9.81 ml/m LA Vol (A4C): 52.2 ml 26.26 ml/m  AORTIC VALVE LVOT Vmax:   97.70 cm/s LVOT Vmean:  67.300 cm/s LVOT VTI:    0.219 m  AORTA Ao Root diam: 3.40 cm Ao Asc diam:  3.20 cm MITRAL VALVE               TRICUSPID VALVE MV Area (PHT): 2.80 cm    TR Peak grad:   5.0 mmHg MV Decel Time: 271 msec    TR Vmax:        112.00 cm/s MV E velocity: 64.70 cm/s MV Nyree Yonker velocity: 88.70 cm/s  SHUNTS MV  E/Jermany Sundell ratio:  0.73        Systemic VTI:  0.22 m                            Systemic Diam: 2.10 cm Kirk Ruths MD Electronically signed by Kirk Ruths MD Signature Date/Time: 11/22/2022/2:43:53 PM    Final         Scheduled Meds:  amLODipine  2.5 mg Oral QHS   hydrochlorothiazide  25 mg Oral Daily   insulin aspart  0-15 Units Subcutaneous TID WC   losartan  100 mg Oral Daily   Continuous Infusions:  heparin       LOS: 2 days    Time spent: over 30 min     Fayrene Helper, MD [facility]   To contact the attending provider between 7A-7P or the covering provider during after hours 7P-7A, please  log into the web site [email or web address] and access using universal [place] password for that web site. If you do not have the password, please call the hospital operator.  11/23/2022, 7:36 PM

## 2022-11-23 NOTE — Progress Notes (Signed)
[place] for Eliquis >> Heparin Indication: pulmonary embolus  Allergies  Allergen Reactions   Lisinopril Cough    Patient Measurements: Height: '5\' 7"'$  (170.2 cm) Weight: 86.9 kg (191 lb 8 oz) IBW/kg (Calculated) : 61.6 Heparin Dosing Weight: 81 kg  Vital Signs:    Labs: Recent Labs    11/21/22 1018 11/21/22 1023 11/21/22 1207 11/22/22 0457 11/22/22 1208 11/23/22 0039  HGB 11.8*  --   --  11.3*  --  11.0*  HCT 36.1  --   --  34.7*  --  33.1*  PLT 252  --   --  241  --  246  LABPROT  --  15.1  --   --   --   --   INR  --  1.2  --   --   --   --   HEPARINUNFRC  --   --   --  0.59 <0.10*  --   CREATININE 1.11*  --   --  0.98  --  1.04*  TROPONINIHS 4  --  6  --   --   --      Estimated Creatinine Clearance: 49.6 mL/min (A) (by C-G formula based on SCr of 1.04 mg/dL (H)).   Assessment: 81 yo F on heparin for PE, transitioned to Eliquis 12/1, received dose this am.  New orders to transition back to heparin, possible need for procedure. Plan currently unclear. Will restart heparin tonight. Will monitor and dose adjust heparin off aptt's for now until levels correlate with heparin levels.   Goal of Therapy:  Heparin level 0.3-0.7 Aptt goal 66-102s Monitor platelets by anticoagulation protocol: Yes   Plan:  Restart heparin at 1550 units/hr tonight at 2000  Check heparin level, aptt, and cbc in am  Erin Hearing PharmD., BCPS Clinical Pharmacist 11/23/2022 5:01 PM

## 2022-11-24 DIAGNOSIS — I2699 Other pulmonary embolism without acute cor pulmonale: Secondary | ICD-10-CM | POA: Diagnosis not present

## 2022-11-24 LAB — CBC WITH DIFFERENTIAL/PLATELET
Abs Immature Granulocytes: 0.04 10*3/uL (ref 0.00–0.07)
Basophils Absolute: 0 10*3/uL (ref 0.0–0.1)
Basophils Relative: 0 %
Eosinophils Absolute: 0.1 10*3/uL (ref 0.0–0.5)
Eosinophils Relative: 1 %
HCT: 32 % — ABNORMAL LOW (ref 36.0–46.0)
Hemoglobin: 10.6 g/dL — ABNORMAL LOW (ref 12.0–15.0)
Immature Granulocytes: 1 %
Lymphocytes Relative: 33 %
Lymphs Abs: 2.7 10*3/uL (ref 0.7–4.0)
MCH: 27.2 pg (ref 26.0–34.0)
MCHC: 33.1 g/dL (ref 30.0–36.0)
MCV: 82.3 fL (ref 80.0–100.0)
Monocytes Absolute: 0.7 10*3/uL (ref 0.1–1.0)
Monocytes Relative: 8 %
Neutro Abs: 4.6 10*3/uL (ref 1.7–7.7)
Neutrophils Relative %: 57 %
Platelets: 240 10*3/uL (ref 150–400)
RBC: 3.89 MIL/uL (ref 3.87–5.11)
RDW: 15.3 % (ref 11.5–15.5)
WBC: 8.1 10*3/uL (ref 4.0–10.5)
nRBC: 0 % (ref 0.0–0.2)

## 2022-11-24 LAB — COMPREHENSIVE METABOLIC PANEL
ALT: 14 U/L (ref 0–44)
AST: 18 U/L (ref 15–41)
Albumin: 3.2 g/dL — ABNORMAL LOW (ref 3.5–5.0)
Alkaline Phosphatase: 57 U/L (ref 38–126)
Anion gap: 10 (ref 5–15)
BUN: 16 mg/dL (ref 8–23)
CO2: 24 mmol/L (ref 22–32)
Calcium: 8.6 mg/dL — ABNORMAL LOW (ref 8.9–10.3)
Chloride: 104 mmol/L (ref 98–111)
Creatinine, Ser: 1.02 mg/dL — ABNORMAL HIGH (ref 0.44–1.00)
GFR, Estimated: 56 mL/min — ABNORMAL LOW (ref >60)
Glucose, Bld: 172 mg/dL — ABNORMAL HIGH (ref 70–99)
Potassium: 3.8 mmol/L (ref 3.5–5.1)
Sodium: 138 mmol/L (ref 135–145)
Total Bilirubin: 0.6 mg/dL (ref 0.3–1.2)
Total Protein: 6.3 g/dL — ABNORMAL LOW (ref 6.5–8.1)

## 2022-11-24 LAB — GLUCOSE, CAPILLARY
Glucose-Capillary: 165 mg/dL — ABNORMAL HIGH (ref 70–99)
Glucose-Capillary: 213 mg/dL — ABNORMAL HIGH (ref 70–99)
Glucose-Capillary: 119 mg/dL — ABNORMAL HIGH (ref 70–99)
Glucose-Capillary: 171 mg/dL — ABNORMAL HIGH (ref 70–99)

## 2022-11-24 LAB — APTT
aPTT: 143 seconds — ABNORMAL HIGH (ref 24–36)
aPTT: 113 seconds — ABNORMAL HIGH (ref 24–36)

## 2022-11-24 LAB — MAGNESIUM: Magnesium: 1.7 mg/dL (ref 1.7–2.4)

## 2022-11-24 LAB — HEPARIN LEVEL (UNFRACTIONATED): Heparin Unfractionated: >1.1 IU/mL — ABNORMAL HIGH (ref 0.30–0.70)

## 2022-11-24 LAB — PHOSPHORUS: Phosphorus: 2.9 mg/dL (ref 2.5–4.6)

## 2022-11-24 NOTE — Consult Note (Signed)
CONSULT NOTE FOR [facility]  Reason for Consult: Pancreatic head mass Referring Physician: [facility]  Janann August HPI: This is a 81 year old female with a PMH of cirrhosis, HCV s/p treatment, DM, history of breast cancer, HTN, and history of chronic pancreatitis admitted for chest pain.  Further work up of the chest pain showed a mild to moderate PE in the distal pulmonary artery with right heart strain.  Also, a 4 cm mass was noted in the head of the pancreas.  An EUS was performed by Dr. Ardis Hughs in 2014 with findings consistent with a chronic pancreatitis.  Heparin was initiated to manage her PE.  Past Medical History:  Diagnosis Date   Abnormal finding on Pap smear    Allergy    SEASONAL   Anxiety    Breast cancer ([place]) 2000   right breast lumptectomy, radiation done   Cirrhosis ([place]) 11/2012   resolved per pt   Diabetes mellitus type 2, controlled ([place])    Hepatitis C    took tx for    Hypertension    Obesity    Thrombocytopenia ([place]) 11-2012    Past Surgical History:  Procedure Laterality Date   ANTERIOR AND POSTERIOR REPAIR N/A 02/28/2021   Procedure: ANTERIOR (CYSTOCELE) AND POSTERIOR REPAIR (RECTOCELE);  Surgeon: Cheri Fowler, MD;  Location: [facility];  Service: Gynecology;  Laterality: N/A;   BREAST BIOPSY Left 09/09/2016   BREAST LUMPECTOMY  right   BREAST LUMPECTOMY     BREAST REDUCTION SURGERY  2005   CATARACT EXTRACTION, BILATERAL  2014   CHOLECYSTECTOMY N/A 03/30/2013   Procedure: LAPAROSCOPIC CHOLECYSTECTOMY WITH INTRAOPERATIVE CHOLANGIOGRAM;  Surgeon: Shann Medal, MD;  Location: [facility];  Service: General;  Laterality: N/A;   COLONOSCOPY  2021   CYSTOSCOPY N/A 02/28/2021   Procedure: Consuela Mimes;  Surgeon: Cheri Fowler, MD;  Location: [place];  Service: Gynecology;  Laterality: N/A;   CYSTOSCOPY W/ RETROGRADES Left 02/28/2021   Procedure: CYSTOSCOPY WITH RETROGRADE PYELOGRAM;  Surgeon: Cheri Fowler, MD;   Location: [place];  Service: Gynecology;  Laterality: Left;   EUS  01/28/2013   Procedure: UPPER ENDOSCOPIC ULTRASOUND (EUS) LINEAR;  Surgeon: Milus Banister, MD;  Location: [facility];  Service: Endoscopy;  Laterality: N/A;  ride-adrian  Fogel [phone]   EXCISION OF SKIN TAG Right 03/30/2013   Procedure: EXCISION OF SKIN TAG;  Surgeon: Shann Medal, MD;  Location: [facility];  Service: General;  Laterality: Right;   LIVER BIOPSY  03/30/2013   Procedure: LIVER BIOPSY;  Surgeon: Shann Medal, MD;  Location: [facility];  Service: General;;   POLYPECTOMY     REDUCTION MAMMAPLASTY     THYROIDECTOMY, PARTIAL  1972   left   VAGINAL HYSTERECTOMY N/A 02/28/2021   Procedure: HYSTERECTOMY VAGINAL WITH UNILATERAL SALPINGECTOMY;  Surgeon: Cheri Fowler, MD;  Location: [place];  Service: Gynecology;  Laterality: N/A;    Family History  Problem Relation Age of Onset   Diabetes Mother    Stroke Mother    Colon cancer Sister 37   Colon polyps Neg Hx    Esophageal cancer Neg Hx    Rectal cancer Neg Hx    Stomach cancer Neg Hx    Pancreatic cancer Neg Hx    Prostate cancer Neg Hx    Breast cancer Neg Hx     Social History:  reports that she has never smoked. She has never used smokeless tobacco. She reports that  she does not drink alcohol and does not use drugs.  Allergies:  Allergies  Allergen Reactions   Lisinopril Cough    Medications: Scheduled:  amLODipine  2.5 mg Oral QHS   hydrochlorothiazide  25 mg Oral Daily   insulin aspart  0-15 Units Subcutaneous TID WC   losartan  100 mg Oral Daily   Continuous:  heparin 1,550 Units/hr (11/24/22 0829)    Results for orders placed or performed during the hospital encounter of 11/21/22 (from the past 24 hour(s))  Glucose, capillary     Status: Abnormal   Collection Time: 11/23/22 11:43 AM  Result Value Ref Range   Glucose-Capillary 171 (H) 70 - 99 mg/dL  Glucose, capillary     Status: Abnormal    Collection Time: 11/23/22  3:48 PM  Result Value Ref Range   Glucose-Capillary 178 (H) 70 - 99 mg/dL  Glucose, capillary     Status: Abnormal   Collection Time: 11/23/22  9:22 PM  Result Value Ref Range   Glucose-Capillary 259 (H) 70 - 99 mg/dL   Comment 1 Notify RN    Comment 2 Document in Chart   Glucose, capillary     Status: Abnormal   Collection Time: 11/24/22  6:00 AM  Result Value Ref Range   Glucose-Capillary 165 (H) 70 - 99 mg/dL   Comment 1 Notify RN    Comment 2 Document in Chart   Heparin level (unfractionated)     Status: Abnormal   Collection Time: 11/24/22  6:02 AM  Result Value Ref Range   Heparin Unfractionated >1.10 (H) 0.30 - 0.70 IU/mL  APTT     Status: Abnormal   Collection Time: 11/24/22  6:02 AM  Result Value Ref Range   aPTT 143 (H) 24 - 36 seconds  CBC with Differential/Platelet     Status: Abnormal   Collection Time: 11/24/22  6:02 AM  Result Value Ref Range   WBC 8.1 4.0 - 10.5 K/uL   RBC 3.89 3.87 - 5.11 MIL/uL   Hemoglobin 10.6 (L) 12.0 - 15.0 g/dL   HCT 32.0 (L) 36.0 - 46.0 %   MCV 82.3 80.0 - 100.0 fL   MCH 27.2 26.0 - 34.0 pg   MCHC 33.1 30.0 - 36.0 g/dL   RDW 15.3 11.5 - 15.5 %   Platelets 240 150 - 400 K/uL   nRBC 0.0 0.0 - 0.2 %   Neutrophils Relative % 57 %   Neutro Abs 4.6 1.7 - 7.7 K/uL   Lymphocytes Relative 33 %   Lymphs Abs 2.7 0.7 - 4.0 K/uL   Monocytes Relative 8 %   Monocytes Absolute 0.7 0.1 - 1.0 K/uL   Eosinophils Relative 1 %   Eosinophils Absolute 0.1 0.0 - 0.5 K/uL   Basophils Relative 0 %   Basophils Absolute 0.0 0.0 - 0.1 K/uL   Immature Granulocytes 1 %   Abs Immature Granulocytes 0.04 0.00 - 0.07 K/uL  Comprehensive metabolic panel     Status: Abnormal   Collection Time: 11/24/22  6:02 AM  Result Value Ref Range   Sodium 138 135 - 145 mmol/L   Potassium 3.8 3.5 - 5.1 mmol/L   Chloride 104 98 - 111 mmol/L   CO2 24 22 - 32 mmol/L   Glucose, Bld 172 (H) 70 - 99 mg/dL   BUN 16 8 - 23 mg/dL   Creatinine, Ser  1.02 (H) 0.44 - 1.00 mg/dL   Calcium 8.6 (L) 8.9 - 10.3 mg/dL   Total Protein 6.3 (  L) 6.5 - 8.1 g/dL   Albumin 3.2 (L) 3.5 - 5.0 g/dL   AST 18 15 - 41 U/L   ALT 14 0 - 44 U/L   Alkaline Phosphatase 57 38 - 126 U/L   Total Bilirubin 0.6 0.3 - 1.2 mg/dL   GFR, Estimated 56 (L) >60 mL/min   Anion gap 10 5 - 15  Magnesium     Status: None   Collection Time: 11/24/22  6:02 AM  Result Value Ref Range   Magnesium 1.7 1.7 - 2.4 mg/dL  Phosphorus     Status: None   Collection Time: 11/24/22  6:02 AM  Result Value Ref Range   Phosphorus 2.9 2.5 - 4.6 mg/dL     VAS [place] LOWER EXTREMITY VENOUS (DVT)  Result Date: 11/23/2022  Lower Venous DVT Study Patient Name:  GRISELLE RUFER  Date of Exam:   11/22/2022 Medical Rec #: [id number]          Accession #:    [id number] Date of Birth: [birth year]          Patient Gender: F Patient Age:   55 years Exam Location:  [facility] Procedure:      VAS [place] LOWER EXTREMITY VENOUS (DVT) Referring Phys: Bonnielee Haff --------------------------------------------------------------------------------  Indications: Pulmonary embolism.  Comparison Study: no prior Performing Technologist: Archie Patten RVS  Examination Guidelines: A complete evaluation includes B-mode imaging, spectral Doppler, color Doppler, and power Doppler as needed of all accessible portions of each vessel. Bilateral testing is considered an integral part of a complete examination. Limited examinations for reoccurring indications may be performed as noted. The reflux portion of the exam is performed with the patient in reverse Trendelenburg.  +---------+---------------+---------+-----------+----------+--------------+ RIGHT    CompressibilityPhasicitySpontaneityPropertiesThrombus Aging +---------+---------------+---------+-----------+----------+--------------+ CFV      Full           Yes      Yes                                  +---------+---------------+---------+-----------+----------+--------------+ SFJ      Full                                                        +---------+---------------+---------+-----------+----------+--------------+ FV Prox  Full                                                        +---------+---------------+---------+-----------+----------+--------------+ FV Mid   Full                                                        +---------+---------------+---------+-----------+----------+--------------+ FV DistalFull                                                        +---------+---------------+---------+-----------+----------+--------------+  PFV      Full                                                        +---------+---------------+---------+-----------+----------+--------------+ POP      Full           Yes      Yes                                 +---------+---------------+---------+-----------+----------+--------------+ PTV      Full                                                        +---------+---------------+---------+-----------+----------+--------------+ PERO     Full                                                        +---------+---------------+---------+-----------+----------+--------------+   +---------+---------------+---------+-----------+----------+--------------+ LEFT     CompressibilityPhasicitySpontaneityPropertiesThrombus Aging +---------+---------------+---------+-----------+----------+--------------+ CFV      Full           Yes      Yes                                 +---------+---------------+---------+-----------+----------+--------------+ SFJ      Full                                                        +---------+---------------+---------+-----------+----------+--------------+ FV Prox  Full                                                         +---------+---------------+---------+-----------+----------+--------------+ FV Mid   Full                                                        +---------+---------------+---------+-----------+----------+--------------+ FV DistalFull                                                        +---------+---------------+---------+-----------+----------+--------------+ PFV      Full                                                        +---------+---------------+---------+-----------+----------+--------------+  POP      Full           Yes      Yes                                 +---------+---------------+---------+-----------+----------+--------------+ PTV      Full                                                        +---------+---------------+---------+-----------+----------+--------------+ PERO     Full           Yes      Yes                                 +---------+---------------+---------+-----------+----------+--------------+     Summary: BILATERAL: - No evidence of deep vein thrombosis seen in the lower extremities, bilaterally. -No evidence of popliteal cyst, bilaterally.   *See table(s) above for measurements and observations. Electronically signed by Orlie Pollen on 11/23/2022 at 1:25:52 PM.    Final    MR ABDOMEN MRCP W WO CONTAST  Result Date: 11/23/2022 CLINICAL DATA:  81 year old female with abnormal appearance of the pancreas on prior chest CTA examination. Follow-up study. EXAM: MRI ABDOMEN WITHOUT AND WITH CONTRAST (INCLUDING MRCP) TECHNIQUE: Multiplanar multisequence MR imaging of the abdomen was performed both before and after the administration of intravenous contrast. Heavily T2-weighted images of the biliary and pancreatic ducts were obtained, and three-dimensional MRCP images were rendered by post processing. CONTRAST:  60m GADAVIST GADOBUTROL 1 MMOL/ML IV SOLN COMPARISON:  Abdominal MRI 11/14/2014.  Chest CTA 11/21/2022. FINDINGS: Comment:  Portions of today's examination are limited by considerable patient motion. Lower chest: Unremarkable. Hepatobiliary: Mild diffuse loss of signal intensity throughout the hepatic parenchyma on out of phase dual echo images, indicative of a background of mild hepatic steatosis. Liver has a shrunken appearance and nodular contour, indicative of underlying cirrhosis. During arterial phase imaging there are several ill-defined peripheral predominant areas of mild hyperenhancement scattered throughout all aspects of the liver, which are not associated with signal abnormalities on precontrast imaging, not associated with diffusion restriction, and completely normalize in appearance on later post gadolinium imaging, most compatible with benign perfusion anomalies (transient hepatic intensity differences (THIDs)). No other suspicious appearing hepatic lesions are noted. Status post cholecystectomy. No intrahepatic biliary ductal dilatation. Common bile duct measures 9 mm within the porta hepatis, within normal limits given the patient's advanced age and post cholecystectomy status. No filling defect in the common bile duct to suggest choledocholithiasis. Pancreas: Increased number and size of what are now innumerable small T1 hypointense and T2 hyperintense lesions scattered throughout the pancreas, largest of which is in the anterior aspect of the neck of the pancreas (axial image 28 of series 4) measuring 1.7 x 1.3 cm. MRCP images demonstrate severe dilatation of the main pancreatic duct, which currently measures up to 1.4 cm in diameter in the body of the pancreas. There is abrupt cut off of the duct in the region of the pancreatic neck best appreciated on axial image 26 of series 4 and coronal image 25 of series 5. No discrete pancreatic lesion confidently identified in this region (portions of today's examination are substantially limited by  patient motion), however, there does appear to be a small focus of diffusion  restriction in this region (axial image 93 of series 9). No peripancreatic fluid collections or inflammatory changes. Spleen:  Unremarkable. Adrenals/Urinary Tract: T1 hypointense, T2 hyperintense, nonenhancing lesions in both kidneys are compatible with simple (Bosniak class 1) cysts (no imaging follow-up recommended), measuring up to 2.1 cm in the lower pole of the left kidney. No aggressive appearing renal lesions are noted. No hydroureteronephrosis in the visualized portions of the abdomen. Bilateral adrenal glands are normal in appearance. Stomach/Bowel: Innumerable colonic diverticuli are noted in the visualized portions of the colon. Vascular/Lymphatic: Aortic atherosclerosis, without definite aneurysm in the abdominal vasculature. Multiple borderline enlarged and enlarged lymph nodes are noted in the upper abdomen and retroperitoneum, many of which demonstrate restricted diffusion. The largest of these are in the region of the hepatoduodenal ligament and portacaval nodal stations measuring up to 1.9 cm in short axis (axial image 35 of series 22). The largest retroperitoneal lymph node is in the left para-aortic nodal station (axial image 66 of series 22) measuring 1 cm in short axis. Other: No significant volume of ascites noted in the visualized portions of the peritoneal cavity. Musculoskeletal: No aggressive appearing osseous lesions are noted in the visualized portions of the skeleton. IMPRESSION: 1. Marked change in the appearance of the pancreas, now characterized by diffuse ductal dilatation throughout the body and tail of the pancreas with abrupt cut off of the duct in the region of the pancreatic neck where there is a small focus of diffusion restriction. Although no discrete obstructing pancreatic mass is confidently identified on today's motion limited examination, an obstructing lesion is strongly suspected. Further evaluation with endoscopic ultrasound and potential tissue sampling is strongly  recommended in the near future to better evaluate these findings. 2. Multiple prominent borderline enlarged and enlarged retroperitoneal and upper abdominal lymph nodes, most evident in the hepato duodenal ligament, many of which demonstrate diffusion restriction, concerning for metastatic lymphadenopathy. 3. Morphologic changes in the liver indicative of underlying cirrhosis. No aggressive appearing hepatic lesions are noted on today's examination. 4. Severe colonic diverticulosis. 5. Additional incidental findings, as above. Electronically Signed   By: Vinnie Langton M.D.   On: 11/23/2022 08:21   ECHOCARDIOGRAM COMPLETE  Result Date: 11/22/2022    ECHOCARDIOGRAM REPORT   Patient Name:   ARACELIE ADDIS Date of Exam: 11/22/2022 Medical Rec #:  [id number]         Height:       67.0 in Accession #:    [id number]        Weight:       192.1 lb Date of Birth:  07-20-1941         BSA:          1.988 m Patient Age:    38 years          BP:           192/99 mmHg Patient Gender: F                 HR:           66 bpm. Exam Location:  Inpatient Procedure: 2D Echo, Cardiac Doppler and Color Doppler Indications:    Pulmonary Embolus I26.09  History:        Patient has no prior history of Echocardiogram examinations.                 P.E.; Risk Factors:Hypertension, Diabetes and Non-Smoker.  Sonographer:  Greer Pickerel Referring Phys: [id number] [facility]  Sonographer Comments: Image acquisition challenging due to respiratory motion. IMPRESSIONS  1. Left ventricular ejection fraction, by estimation, is 70 to 75%. The left ventricle has hyperdynamic function. The left ventricle has no regional wall motion abnormalities. Left ventricular diastolic parameters are consistent with Grade I diastolic dysfunction (impaired relaxation).  2. Right ventricular systolic function is normal. The right ventricular size is normal. Tricuspid regurgitation signal is inadequate for assessing PA pressure.  3. The mitral valve is normal in  structure. No evidence of mitral valve regurgitation. No evidence of mitral stenosis.  4. The aortic valve is tricuspid. Aortic valve regurgitation is not visualized. No aortic stenosis is present.  5. The inferior vena cava is normal in size with greater than 50% respiratory variability, suggesting right atrial pressure of 3 mmHg. Comparison(s): No prior Echocardiogram. FINDINGS  Left Ventricle: Left ventricular ejection fraction, by estimation, is 70 to 75%. The left ventricle has hyperdynamic function. The left ventricle has no regional wall motion abnormalities. The left ventricular internal cavity size was normal in size. There is no left ventricular hypertrophy. Left ventricular diastolic parameters are consistent with Grade I diastolic dysfunction (impaired relaxation). Right Ventricle: The right ventricular size is normal. Right ventricular systolic function is normal. Tricuspid regurgitation signal is inadequate for assessing PA pressure. The tricuspid regurgitant velocity is 1.12 m/s, and with an assumed right atrial  pressure of 3 mmHg, the estimated right ventricular systolic pressure is 8.0 mmHg. Left Atrium: Left atrial size was normal in size. Right Atrium: Right atrial size was normal in size. Pericardium: There is no evidence of pericardial effusion. Mitral Valve: The mitral valve is normal in structure. Mild mitral annular calcification. No evidence of mitral valve regurgitation. No evidence of mitral valve stenosis. Tricuspid Valve: The tricuspid valve is normal in structure. Tricuspid valve regurgitation is trivial. No evidence of tricuspid stenosis. Aortic Valve: The aortic valve is tricuspid. Aortic valve regurgitation is not visualized. No aortic stenosis is present. Pulmonic Valve: The pulmonic valve was normal in structure. Pulmonic valve regurgitation is not visualized. No evidence of pulmonic stenosis. Aorta: The aortic root is normal in size and structure. Venous: The inferior vena cava  is normal in size with greater than 50% respiratory variability, suggesting right atrial pressure of 3 mmHg. IAS/Shunts: No atrial level shunt detected by color flow Doppler.  LEFT VENTRICLE PLAX 2D LVIDd:         3.90 cm   Diastology LVIDs:         2.20 cm   LV e' medial:    4.97 cm/s LV PW:         1.00 cm   LV E/e' medial:  13.0 LV IVS:        1.10 cm   LV e' lateral:   9.01 cm/s LVOT diam:     2.10 cm   LV E/e' lateral: 7.2 LV SV:         76 LV SV Index:   38 LVOT Area:     3.46 cm  RIGHT VENTRICLE RV S prime:     11.30 cm/s TAPSE (M-mode): 1.8 cm LEFT ATRIUM           Index        RIGHT ATRIUM           Index LA diam:      2.40 cm 1.21 cm/m   RA Area:     10.70 cm LA Vol (A2C): 74.5 ml 37.48  ml/m  RA Volume:   19.50 ml  9.81 ml/m LA Vol (A4C): 52.2 ml 26.26 ml/m  AORTIC VALVE LVOT Vmax:   97.70 cm/s LVOT Vmean:  67.300 cm/s LVOT VTI:    0.219 m  AORTA Ao Root diam: 3.40 cm Ao Asc diam:  3.20 cm MITRAL VALVE               TRICUSPID VALVE MV Area (PHT): 2.80 cm    TR Peak grad:   5.0 mmHg MV Decel Time: 271 msec    TR Vmax:        112.00 cm/s MV E velocity: 64.70 cm/s MV A velocity: 88.70 cm/s  SHUNTS MV E/A ratio:  0.73        Systemic VTI:  0.22 m                            Systemic Diam: 2.10 cm Kirk Ruths MD Electronically signed by Kirk Ruths MD Signature Date/Time: 11/22/2022/2:43:53 PM    Final     ROS:  As stated above in the HPI otherwise negative.  Blood pressure 119/84, pulse 78, temperature 99.1 F (37.3 C), temperature source Oral, resp. rate 18, height '5\' 7"'$  (1.702 m), weight 87.2 kg, SpO2 99 %.    PE: Gen: NAD, Alert and Oriented HEENT:  Mobile/AT, EOMI Neck: Supple, no LAD Lungs: CTA Bilaterally CV: RRR without M/G/R ABD: Soft, NTND, +BS Ext: No C/C/E  Assessment/Plan: 1) Pancreatic head mass. 2) PE. 3) History of cirrhosis and HCV. 4) Anemia.   Further evaluation with an EUS and FNA is required, but she declines.  She is very nervous and anxious about her  diagnosis.  The patient was offered an EUS with me tomorrow at noon to expedite the work up, but she wants to wait "a few weeks".  She also has a vacation to the [place] starting January 5th for one week.  Her desire is to go on the vacation and have further work up afterwards.  She was informed that this is most likely a cancer and it is also the source of her PE, which she nervously acknowledged.  Plan: 1) Continue with anticoagulation. 2) Check Ca 19-9. 3) Further work up as an outpatient with [facility].   Carol Ada D 11/24/2022, 8:37 AM

## 2022-11-24 NOTE — Progress Notes (Signed)
PROGRESS NOTE    [name]  DJM:[id number] DOB: February 09, 1941 DOA: 11/21/2022 PCP: Elby Showers, MD  Chief Complaint  Patient presents with   Chest Pain    Brief Narrative:  Victoria [name] is Victoria [name] 81 y.o. female with Victoria [name] past medical history of right breast cancer, essential hypertension, diabetes mellitus type 2, previous history of pancreatitis, history of pancreatic lesion who was admitted with Victoria [name] submassive PE.  She's stable on anticoagulation.  Imaging showed findings concerning for pancreatic mass and metastatic lymphadenopathy, planning for EUS with FNA on 12/4 with Dr. Benson Norway prior to discharge.  See below for additional details  Assessment & Plan:   Principal Problem:   Acute pulmonary embolism ([place]) Active Problems:   Diabetes mellitus ([place])   Hypertension   History of breast cancer   Pancreatic lesion   #1. Submassive Pulmonary Embolism:  Unclear provoking factor, given below, concern for metastatic cancer Echo with normal RVSF LE [place] negative for DVT Needs 3-6 months anticoagulation.  With concern for malignancy, this needs to be revisited, high likelihood she will be indefinte anticoagulation candidate. Given abnormal MRCP and recommendation for EUS and possible bx, restarted heparin gtt and asked GI to eval for possible procedure.  I think coordinating procedure as outpatient on anticoagulation would be difficult.  She's been anticoagulated since 11/30 and would otherwise be Bentlee Benningfield candidate for discharge.  I discussed the plan for the EUS with FNA informally with PCCM.   #2. Concern for Pancreatic Mass  Concern for Metastatic Lymphadenopathy Abnormal MRCP, rads recommended further evaluation with endoscopic [place] and tissue sampling in the near future - given her need for anticoagulation for the submassive PE, this would be difficult to coordinate outpatient as she really needs uninterrupted anticoagulation.  Dr. Benson Norway had Victoria [name] conversation with her today and she declined  workup with EUS and FNA.  Part of the reason she declined was because she wanted to go on vacation in January.  I had [name] conversation with Victoria [name] and tried to emphasize the importance these next steps so we know what the potential diagnosis is and the options (I mentioned that this does not preclude her going on the vacation necessarily, but it would allow for [place] to hopefully have the information we need with regards to her diagnosis so that she and her oncologist can make an informed decision about this).  She's agreeable to going forward with the procedure at this time.  Dr. Benson Norway is going to assist [place] tomorrow.  - NPO at midnight - CA 19-9 pending - Last dose of eliquis 12/2 AM.  EUS with FNA 12/4 per Dr. Benson Norway.   #3.  History of breast cancer: This was in the right breast, status post lumpectomy and radiation treatment several years ago.  11/30 she raised concern about possible lesion in the left breast.  My partner could not palpate any lesion in the left breast especially in the area where she was pointing.  Exam limited by positioning due to significant left-sided chest pain with movement.  She will need Victoria [name] full evaluation including Victoria [name] mammogram after discharge.   #4.  Diabetes mellitus type 2: Aq1c 7 05/2022 - Monitor CBGs.  SSI.  Holding her home medications for now.   #5.  Essential hypertension: Continue with amlodipine. Restart HCTZ and losartan.   #6.  Nodular appearing areas of peripheral bilateral lobe scarring and/or atelectasis noted incidentally on CT scan.  May need further evaluation in the outpatient setting.  DVT prophylaxis: eliquis Code Status: full Family Communication: son at bedside Disposition:   Status is: Inpatient Remains inpatient appropriate because: need for further w/u   Consultants:  none  Procedures:  Echo IMPRESSIONS     1. Left ventricular ejection fraction, by estimation, is 70 to 75%. The  left ventricle has hyperdynamic function. The left  ventricle has no  regional wall motion abnormalities. Left ventricular diastolic parameters  are consistent with Grade I diastolic  dysfunction (impaired relaxation).   2. Right ventricular systolic function is normal. The right ventricular  size is normal. Tricuspid regurgitation signal is inadequate for assessing  PA pressure.   3. The mitral valve is normal in structure. No evidence of mitral valve  regurgitation. No evidence of mitral stenosis.   4. The aortic valve is tricuspid. Aortic valve regurgitation is not  visualized. No aortic stenosis is present.   5. The inferior vena cava is normal in size with greater than 50%  respiratory variability, suggesting right atrial pressure of 3 mmHg.   Comparison(s): No prior Echocardiogram.   LE [place] Summary:  BILATERAL:  - No evidence of deep vein thrombosis seen in the lower extremities,  bilaterally.  -No evidence of popliteal cyst, bilaterally.   Antimicrobials:  Anti-infectives (From admission, onward)    None       Subjective: No complaints  Objective: Vitals:   11/23/22 2005 11/24/22 0000 11/24/22 0607 11/24/22 1048  BP: 120/79 125/81 119/84 (!) 145/97  Pulse: 78 78 78 89  Resp: '18 18 18 18  '$ Temp: 99.1 F (37.3 C) 99.1 F (37.3 C) 99.1 F (37.3 C) 98.4 F (36.9 C)  TempSrc: Oral Oral Oral Oral  SpO2: 99% 99%  98%  Weight:   87.2 kg   Height:        Intake/Output Summary (Last 24 hours) at 11/24/2022 2027 Last data filed at 11/24/2022 1824 Gross per 24 hour  Intake 635.37 ml  Output 1250 ml  Net -614.63 ml   Filed Weights   11/22/22 0530 11/23/22 0452 11/24/22 0607  Weight: 87.1 kg 86.9 kg 87.2 kg    Examination:  General: No acute distress. Cardiovascular: RRR Lungs: unlabored Abdomen: Soft, nontender, nondistended Neurological: Alert and oriented 3. Moves all extremities 4. Cranial nerves II through XII grossly intact. Extremities: No clubbing or cyanosis. No edema.   Data Reviewed: I have  personally reviewed following labs and imaging studies  CBC: Recent Labs  Lab 11/21/22 1018 11/22/22 0457 11/23/22 0039 11/24/22 0602  WBC 11.2* 10.6* 8.7 8.1  NEUTROABS  --   --  4.7 4.6  HGB 11.8* 11.3* 11.0* 10.6*  HCT 36.1 34.7* 33.1* 32.0*  MCV 83.2 83.4 81.9 82.3  PLT 252 241 246 937    Basic Metabolic Panel: Recent Labs  Lab 11/21/22 1018 11/22/22 0457 11/23/22 0039 11/24/22 0602  NA 137 139 136 138  K 4.2 3.5 3.3* 3.8  CL 98 103 102 104  CO2 '23 25 24 24  '$ GLUCOSE 193* 141* 202* 172*  BUN '23 23 21 16  '$ CREATININE 1.11* 0.98 1.04* 1.02*  CALCIUM 9.2 8.8* 8.6* 8.6*  MG  --   --  1.8 1.7  PHOS  --   --  3.3 2.9    GFR: Estimated Creatinine Clearance: 50.7 mL/min (Siddharth Babington) (by C-G formula based on SCr of 1.02 mg/dL (H)).  Liver Function Tests: Recent Labs  Lab 11/21/22 1018 11/23/22 0039 11/24/22 0602  AST '20 15 18  '$ ALT '14 13 14  '$ ALKPHOS  64 55 57  BILITOT 0.5 0.5 0.6  PROT 7.4 6.5 6.3*  ALBUMIN 3.7 3.2* 3.2*    CBG: Recent Labs  Lab 11/23/22 1548 11/23/22 2122 11/24/22 0600 11/24/22 1041 11/24/22 1541  GLUCAP 178* 259* 165* 213* 119*     No results found for this or any previous visit (from the past 240 hour(s)).       Radiology Studies: MR ABDOMEN MRCP W WO CONTAST  Result Date: 11/23/2022 CLINICAL DATA:  81 year old female with abnormal appearance of the pancreas on prior chest CTA examination. Follow-up study. EXAM: MRI ABDOMEN WITHOUT AND WITH CONTRAST (INCLUDING MRCP) TECHNIQUE: Multiplanar multisequence MR imaging of the abdomen was performed both before and after the administration of intravenous contrast. Heavily T2-weighted images of the biliary and pancreatic ducts were obtained, and three-dimensional MRCP images were rendered by post processing. CONTRAST:  33m GADAVIST GADOBUTROL 1 MMOL/ML IV SOLN COMPARISON:  Abdominal MRI 11/14/2014.  Chest CTA 11/21/2022. FINDINGS: Comment: Portions of today's examination are limited by considerable  patient motion. Lower chest: Unremarkable. Hepatobiliary: Mild diffuse loss of signal intensity throughout the hepatic parenchyma on out of phase dual echo images, indicative of Marinda Tyer background of mild hepatic steatosis. Liver has Shunda Rabadi shrunken appearance and nodular contour, indicative of underlying cirrhosis. During arterial phase imaging there are several ill-defined peripheral predominant areas of mild hyperenhancement scattered throughout all aspects of the liver, which are not associated with signal abnormalities on precontrast imaging, not associated with diffusion restriction, and completely normalize in appearance on later post gadolinium imaging, most compatible with benign perfusion anomalies (transient hepatic intensity differences (THIDs)). No other suspicious appearing hepatic lesions are noted. Status post cholecystectomy. No intrahepatic biliary ductal dilatation. Common bile duct measures 9 mm within the porta hepatis, within normal limits given the patient's advanced age and post cholecystectomy status. No filling defect in the common bile duct to suggest choledocholithiasis. Pancreas: Increased number and size of what are now innumerable small T1 hypointense and T2 hyperintense lesions scattered throughout the pancreas, largest of which is in the anterior aspect of the neck of the pancreas (axial image 28 of series 4) measuring 1.7 x 1.3 cm. MRCP images demonstrate severe dilatation of the main pancreatic duct, which currently measures up to 1.4 cm in diameter in the body of the pancreas. There is abrupt cut off of the duct in the region of the pancreatic neck best appreciated on axial image 26 of series 4 and coronal image 25 of series 5. No discrete pancreatic lesion confidently identified in this region (portions of today's examination are substantially limited by patient motion), however, there does appear to be Darby Shadwick small focus of diffusion restriction in this region (axial image 93 of series 9). No  peripancreatic fluid collections or inflammatory changes. Spleen:  Unremarkable. Adrenals/Urinary Tract: T1 hypointense, T2 hyperintense, nonenhancing lesions in both kidneys are compatible with simple (Bosniak class 1) cysts (no imaging follow-up recommended), measuring up to 2.1 cm in the lower pole of the left kidney. No aggressive appearing renal lesions are noted. No hydroureteronephrosis in the visualized portions of the abdomen. Bilateral adrenal glands are normal in appearance. Stomach/Bowel: Innumerable colonic diverticuli are noted in the visualized portions of the colon. Vascular/Lymphatic: Aortic atherosclerosis, without definite aneurysm in the abdominal vasculature. Multiple borderline enlarged and enlarged lymph nodes are noted in the upper abdomen and retroperitoneum, many of which demonstrate restricted diffusion. The largest of these are in the region of the hepatoduodenal ligament and portacaval nodal stations measuring up to 1.9 cm  in short axis (axial image 35 of series 22). The largest retroperitoneal lymph node is in the left para-aortic nodal station (axial image 66 of series 22) measuring 1 cm in short axis. Other: No significant volume of ascites noted in the visualized portions of the peritoneal cavity. Musculoskeletal: No aggressive appearing osseous lesions are noted in the visualized portions of the skeleton. IMPRESSION: 1. Marked change in the appearance of the pancreas, now characterized by diffuse ductal dilatation throughout the body and tail of the pancreas with abrupt cut off of the duct in the region of the pancreatic neck where there is Charlene Cowdrey small focus of diffusion restriction. Although no discrete obstructing pancreatic mass is confidently identified on today's motion limited examination, an obstructing lesion is strongly suspected. Further evaluation with endoscopic ultrasound and potential tissue sampling is strongly recommended in the near future to better evaluate these  findings. 2. Multiple prominent borderline enlarged and enlarged retroperitoneal and upper abdominal lymph nodes, most evident in the hepato duodenal ligament, many of which demonstrate diffusion restriction, concerning for metastatic lymphadenopathy. 3. Morphologic changes in the liver indicative of underlying cirrhosis. No aggressive appearing hepatic lesions are noted on today's examination. 4. Severe colonic diverticulosis. 5. Additional incidental findings, as above. Electronically Signed   By: Vinnie Langton M.D.   On: 11/23/2022 08:21        Scheduled Meds:  amLODipine  2.5 mg Oral QHS   hydrochlorothiazide  25 mg Oral Daily   insulin aspart  0-15 Units Subcutaneous TID WC   losartan  100 mg Oral Daily   Continuous Infusions:  heparin 1,400 Units/hr (11/24/22 1910)     LOS: 3 days    Time spent: over 19 min     Fayrene Helper, MD [facility]   To contact the attending provider between 7A-7P or the covering provider during after hours 7P-7A, please log into the web site [email or web address] and access using universal [place] password for that web site. If you do not have the password, please call the hospital operator.  11/24/2022, 8:27 PM

## 2022-11-24 NOTE — Progress Notes (Signed)
[place] for Eliquis >> Heparin Indication: pulmonary embolus  Allergies  Allergen Reactions   Lisinopril Cough    Patient Measurements: Height: '5\' 7"'$  (170.2 cm) Weight: 87.2 kg (192 lb 3.2 oz) IBW/kg (Calculated) : 61.6 Heparin Dosing Weight: 81 kg  Vital Signs: Temp: 98.4 F (36.9 C) (12/03 1048) Temp Source: Oral (12/03 1048) BP: 145/97 (12/03 1048) Pulse Rate: 89 (12/03 1048)  Labs: Recent Labs    11/22/22 0457 11/22/22 1208 11/23/22 0039 11/24/22 0602 11/24/22 1111  HGB 11.3*  --  11.0* 10.6*  --   HCT 34.7*  --  33.1* 32.0*  --   PLT 241  --  246 240  --   APTT  --   --   --  143* 113*  HEPARINUNFRC 0.59 <0.10*  --  >1.10*  --   CREATININE 0.98  --  1.04* 1.02*  --      Estimated Creatinine Clearance: 50.7 mL/min (A) (by C-G formula based on SCr of 1.02 mg/dL (H)).   Assessment: 81 yo F on heparin for PE, transitioned to Eliquis 12/1, transitioned back to heparin 12/2, possible need for procedure (last apixaban dose taken 12/2 in am).   Heparin level >1 as expected this morning due to recent apixaban. Aptt on recheck was down to 113s on 1550 units/hr of heparin. No bleeding or IV issues noted.   Goal of Therapy:  Heparin level 0.3-0.7 Aptt goal 66-102s Monitor platelets by anticoagulation protocol: Yes   Plan:  Decrease IV heparin to 1400 units/hr Recheck heparin level and aptt in am with cbc  Erin Hearing PharmD., BCPS Clinical Pharmacist 11/24/2022 5:52 PM

## 2022-11-24 NOTE — Progress Notes (Signed)
[place] for Eliquis >> Heparin Indication: pulmonary embolus  Allergies  Allergen Reactions   Lisinopril Cough    Patient Measurements: Height: '5\' 7"'$  (170.2 cm) Weight: 87.2 kg (192 lb 3.2 oz) IBW/kg (Calculated) : 61.6 Heparin Dosing Weight: 81 kg  Vital Signs: Temp: 99.1 F (37.3 C) (12/03 0607) Temp Source: Oral (12/03 0607) BP: 119/84 (12/03 0607) Pulse Rate: 78 (12/03 0607)  Labs: Recent Labs    11/21/22 1018 11/21/22 1023 11/21/22 1207 11/22/22 0457 11/22/22 1208 11/23/22 0039 11/24/22 0602  HGB 11.8*  --   --  11.3*  --  11.0* 10.6*  HCT 36.1  --   --  34.7*  --  33.1* 32.0*  PLT 252  --   --  241  --  246 240  APTT  --   --   --   --   --   --  143*  LABPROT  --  15.1  --   --   --   --   --   INR  --  1.2  --   --   --   --   --   HEPARINUNFRC  --   --   --  0.59 <0.10*  --  >1.10*  CREATININE 1.11*  --   --  0.98  --  1.04* 1.02*  TROPONINIHS 4  --  6  --   --   --   --      Estimated Creatinine Clearance: 50.7 mL/min (A) (by C-G formula based on SCr of 1.02 mg/dL (H)).   Assessment: 81 yo F on heparin for PE, transitioned to Eliquis 12/1, transitioned back to heparin 12/2, possible need for procedure (last apixaban dose taken 12/2 in am).  -heparin level > 1.1, aPTT= 143  Goal of Therapy:  Heparin level 0.3-0.7 Aptt goal 66-102s Monitor platelets by anticoagulation protocol: Yes   Plan:  -hold heparin for 1 hr then decrease infusion to 1250 units/hr -aPTT in 8 hrs  Hildred Laser, PharmD Clinical Pharmacist **Pharmacist phone directory can now be found on [email or web address] ([email or web address]).  Listed under [place].

## 2022-11-25 ENCOUNTER — Inpatient Hospital Stay (HOSPITAL_COMMUNITY): Payer: Medicare HMO | Admitting: Anesthesiology

## 2022-11-25 ENCOUNTER — Encounter (HOSPITAL_COMMUNITY)
Admission: EM | Disposition: A | Discharge status: Home / Self Care | Payer: Self-pay | Source: Home / Self Care | Attending: Family Medicine

## 2022-11-25 ENCOUNTER — Encounter (HOSPITAL_COMMUNITY): Payer: Self-pay | Admitting: Internal Medicine

## 2022-11-25 DIAGNOSIS — I2699 Other pulmonary embolism without acute cor pulmonale: Secondary | ICD-10-CM

## 2022-11-25 DIAGNOSIS — I10 Essential (primary) hypertension: Secondary | ICD-10-CM

## 2022-11-25 DIAGNOSIS — E119 Type 2 diabetes mellitus without complications: Secondary | ICD-10-CM | POA: Diagnosis not present

## 2022-11-25 DIAGNOSIS — K8689 Other specified diseases of pancreas: Secondary | ICD-10-CM

## 2022-11-25 DIAGNOSIS — Z7984 Long term (current) use of oral hypoglycemic drugs: Secondary | ICD-10-CM

## 2022-11-25 DIAGNOSIS — I2692 Saddle embolus of pulmonary artery without acute cor pulmonale: Secondary | ICD-10-CM | POA: Diagnosis not present

## 2022-11-25 DIAGNOSIS — K869 Disease of pancreas, unspecified: Secondary | ICD-10-CM | POA: Diagnosis not present

## 2022-11-25 HISTORY — PX: EUS: SHX5427

## 2022-11-25 HISTORY — PX: FINE NEEDLE ASPIRATION: SHX5430

## 2022-11-25 HISTORY — PX: ESOPHAGOGASTRODUODENOSCOPY (EGD) WITH PROPOFOL: SHX5813

## 2022-11-25 LAB — COMPREHENSIVE METABOLIC PANEL
ALT: 16 U/L (ref 0–44)
AST: 19 U/L (ref 15–41)
Albumin: 3.3 g/dL — ABNORMAL LOW (ref 3.5–5.0)
Alkaline Phosphatase: 60 U/L (ref 38–126)
Anion gap: 10 (ref 5–15)
BUN: 19 mg/dL (ref 8–23)
CO2: 24 mmol/L (ref 22–32)
Calcium: 8.7 mg/dL — ABNORMAL LOW (ref 8.9–10.3)
Chloride: 101 mmol/L (ref 98–111)
Creatinine, Ser: 1.19 mg/dL — ABNORMAL HIGH (ref 0.44–1.00)
GFR, Estimated: 47 mL/min — ABNORMAL LOW (ref >60)
Glucose, Bld: 155 mg/dL — ABNORMAL HIGH (ref 70–99)
Potassium: 3.6 mmol/L (ref 3.5–5.1)
Sodium: 135 mmol/L (ref 135–145)
Total Bilirubin: 0.4 mg/dL (ref 0.3–1.2)
Total Protein: 6.7 g/dL (ref 6.5–8.1)

## 2022-11-25 LAB — GLUCOSE, CAPILLARY
Glucose-Capillary: 161 mg/dL — ABNORMAL HIGH (ref 70–99)
Glucose-Capillary: 108 mg/dL — ABNORMAL HIGH (ref 70–99)
Glucose-Capillary: 96 mg/dL (ref 70–99)
Glucose-Capillary: 173 mg/dL — ABNORMAL HIGH (ref 70–99)

## 2022-11-25 LAB — HEPARIN LEVEL (UNFRACTIONATED): Heparin Unfractionated: >1.1 IU/mL — ABNORMAL HIGH (ref 0.30–0.70)

## 2022-11-25 LAB — CBC
HCT: 32.6 % — ABNORMAL LOW (ref 36.0–46.0)
Hemoglobin: 10.9 g/dL — ABNORMAL LOW (ref 12.0–15.0)
MCH: 27.2 pg (ref 26.0–34.0)
MCHC: 33.4 g/dL (ref 30.0–36.0)
MCV: 81.3 fL (ref 80.0–100.0)
Platelets: 254 10*3/uL (ref 150–400)
RBC: 4.01 MIL/uL (ref 3.87–5.11)
RDW: 15.3 % (ref 11.5–15.5)
WBC: 8.8 10*3/uL (ref 4.0–10.5)
nRBC: 0 % (ref 0.0–0.2)

## 2022-11-25 LAB — APTT: aPTT: 148 seconds — ABNORMAL HIGH (ref 24–36)

## 2022-11-25 LAB — PHOSPHORUS: Phosphorus: 3.4 mg/dL (ref 2.5–4.6)

## 2022-11-25 LAB — MAGNESIUM: Magnesium: 1.7 mg/dL (ref 1.7–2.4)

## 2022-11-25 SURGERY — ESOPHAGOGASTRODUODENOSCOPY (EGD) WITH PROPOFOL
Anesthesia: Monitor Anesthesia Care

## 2022-11-25 MED ORDER — HEPARIN (PORCINE) 25000 UT/250ML-% IV SOLN
1250.0000 [IU]/h | INTRAVENOUS | Status: DC
  Administered 2022-11-25: 1250 [IU]/h via INTRAVENOUS
  Filled 2022-11-25: qty 250

## 2022-11-25 MED ORDER — PROPOFOL 500 MG/50ML IV EMUL
INTRAVENOUS | Status: DC | PRN
  Administered 2022-11-25 (×2): 25 mg via INTRAVENOUS
  Administered 2022-11-25: 100 ug/kg/min via INTRAVENOUS

## 2022-11-25 MED ORDER — LIDOCAINE 2% (20 MG/ML) 5 ML SYRINGE
INTRAMUSCULAR | Status: DC | PRN
  Administered 2022-11-25: 80 mg via INTRAVENOUS

## 2022-11-25 MED ORDER — HEPARIN (PORCINE) 25000 UT/250ML-% IV SOLN
1250.0000 [IU]/h | INTRAVENOUS | Status: DC
  Administered 2022-11-25 – 2022-11-26 (×2): 1250 [IU]/h via INTRAVENOUS
  Filled 2022-11-25: qty 250

## 2022-11-25 MED ORDER — SODIUM CHLORIDE 0.9 % IV SOLN: INTRAVENOUS | Status: DC

## 2022-11-25 MED ORDER — PHENYLEPHRINE 80 MCG/ML (10ML) SYRINGE FOR IV PUSH (FOR BLOOD PRESSURE SUPPORT)
PREFILLED_SYRINGE | INTRAVENOUS | Status: DC | PRN
  Administered 2022-11-25 (×3): 160 ug via INTRAVENOUS
  Administered 2022-11-25: 240 ug via INTRAVENOUS
  Administered 2022-11-25 (×3): 160 ug via INTRAVENOUS

## 2022-11-25 MED ORDER — LACTATED RINGERS IV SOLN
INTRAVENOUS | Status: AC | PRN
  Administered 2022-11-25: 1000 mL via INTRAVENOUS

## 2022-11-25 SURGICAL SUPPLY — 15 items

## 2022-11-25 NOTE — Progress Notes (Signed)
   11/25/22 1616  Mobility  Activity Ambulated independently in hallway  Level of Assistance Independent  Assistive Device None  Distance Ambulated (ft) 550 ft  Activity Response Tolerated well  Mobility Referral Yes  $Mobility charge 1 Mobility   Mobility Specialist Progress Note  Pre-Mobility: 116/55  Received pt in bed having no complaints and agreeable to mobility. Pt was asymptomatic throughout ambulation and returned to room w/o fault. Left in bed w/ call bell in reach and all needs met.  Lucious Groves Mobility Specialist  Please contact via SecureChat or Rehab office at [phone]

## 2022-11-25 NOTE — Progress Notes (Signed)
ANTICOAGULATION CONSULT NOTE - Follow Up Consult  Pharmacy Consult for Heparin Indication: pulmonary embolus  Allergies  Allergen Reactions   Lisinopril Cough    Patient Measurements: Height: '5\' 7"'$  (170.2 cm) Weight: 87 kg (191 lb 12.8 oz) IBW/kg (Calculated) : 61.6 Heparin Dosing Weight: 81 kg  Vital Signs: Temp: 98.8 F (37.1 C) (12/04 1604) Temp Source: Oral (12/04 1604) BP: 138/83 (12/04 1604) Pulse Rate: 66 (12/04 1604)  Labs: Recent Labs    11/23/22 0039 11/24/22 0602 11/24/22 1111 11/25/22 0102  HGB 11.0* 10.6*  --  10.9*  HCT 33.1* 32.0*  --  32.6*  PLT 246 240  --  254  APTT  --  143* 113* 148*  HEPARINUNFRC  --  >1.10*  --  >1.10*  CREATININE 1.04* 1.02*  --  1.19*    Estimated Creatinine Clearance: 43.5 mL/min (A) (by C-G formula based on SCr of 1.19 mg/dL (H)).  Assessment: 81 yo F on heparin for PE, transitioned to Apixaban on 12/1, then transitioned back to heparin on 12/2 with possible need for procedure. Last apixaban dose taken 12/2 am.  Using aPTTs for monitoring until correlating with heparin levels, while heparin levels are falsely elevated from recent Apixaban doses.  Last aPTT was supratherapeutic (148 seconds) on 1400 units/hr ~am 12/4. Heparin was held for 1 hr, then resumed ast 1250 units/hr ~3am > held at 9am for upper EUS and FNA at 12n, before next aPTT and heparin level were due.  Per GI procedure note, may resume heparin at low dose 2 hours post-procedure, then titrate up to therapeutic dose.  Discussed with Dr. Sloan Leiter.  To resume at prior heparin rate now, therapeutic dosing.  Goal of Therapy:  Heparin level 0.3-0.7 units/ml aPTT 66-102 seconds Monitor platelets by anticoagulation protocol: Yes   Plan:  Resume heparin drip at 1250 units/hr.   aPTT and heparin level ~8 hrs after drip resumes. Daily aPTT and heparin level until correlating; daily CBC. Monitor for signs/symptoms of bleeding. Follow up for resuming Eliquis when  able.  Arty Baumgartner, [place] 11/25/2022,4:43 PM

## 2022-11-25 NOTE — Progress Notes (Signed)
Pharmacy:  IV heparin held at [facility] for GI procedure at 12n. - infusion rate was adjusted overnight with high aPTT  Will follow up post-procedure for anticoagulation plans.  Arty Baumgartner, [place] 11/25/2022 10:10 AM

## 2022-11-25 NOTE — Plan of Care (Signed)
  Problem: Coping: Goal: Ability to adjust to condition or change in health will improve Outcome: Progressing   Problem: Fluid Volume: Goal: Ability to maintain a balanced intake and output will improve Outcome: Progressing   Problem: Health Behavior/Discharge Planning: Goal: Ability to manage health-related needs will improve Outcome: Progressing   Problem: Metabolic: Goal: Ability to maintain appropriate glucose levels will improve Outcome: Progressing   Problem: Nutritional: Goal: Maintenance of adequate nutrition will improve Outcome: Progressing   Problem: Skin Integrity: Goal: Risk for impaired skin integrity will decrease Outcome: Progressing   Problem: Tissue Perfusion: Goal: Adequacy of tissue perfusion will improve Outcome: Progressing

## 2022-11-25 NOTE — Progress Notes (Signed)
PROGRESS NOTE    Victoria [name]  WCB:[id number] DOB: 1941-05-12 DOA: 11/21/2022 PCP: Elby Showers, MD  Chief Complaint  Patient presents with   Chest Pain    Brief Narrative:  Victoria [name] is a 81 y.o. female with a past medical history of right breast cancer, essential hypertension, diabetes mellitus type 2, previous history of pancreatitis, history of pancreatic lesion who was admitted with a submassive PE.  She's stable on anticoagulation.  Imaging showed findings concerning for pancreatic mass and metastatic lymphadenopathy, planning for EUS with FNA on 12/4 with Dr. Benson Norway prior to discharge.   Assessment & Plan:   Principal Problem:   Acute pulmonary embolism ([place]) Active Problems:   Diabetes mellitus ([place])   Hypertension   History of breast cancer   Pancreatic lesion   #1. Submassive Pulmonary Embolism:  Likely underlying pancreatic cancer. Echo with normal RVSF LE [place] negative for DVT With concern for malignancy, this needs to be revisited, high likelihood she will be indefinte anticoagulation candidate. Continue heparin until periprocedure.  Will discharge home on Eliquis tomorrow if no complications.    #2.  Abnormal pancreatic lesion: Suspected pancreatic cancer.   Abnormal MRCP, ERCP and EUS and biopsy today.   - CA 19-9 pending.  We will send referral to oncology awaiting biopsy.   #3.  History of breast cancer: To be followed by oncology.   #4.  Diabetes mellitus type 2: Aq1c 7 05/2022 - Monitor CBGs.  SSI.  Holding her home medications for now.   #5.  Essential hypertension: Continue with amlodipine, HCTZ and losartan.      DVT prophylaxis: Heparin infusion. Code Status: full Family Communication: None today. Disposition:   Status is: Inpatient Remains inpatient appropriate because: Inpatient procedure today.   Consultants:  none  Procedures:  Echo IMPRESSIONS     1. Left ventricular ejection fraction, by estimation, is 70 to 75%. The   left ventricle has hyperdynamic function. The left ventricle has no  regional wall motion abnormalities. Left ventricular diastolic parameters  are consistent with Grade I diastolic  dysfunction (impaired relaxation).   2. Right ventricular systolic function is normal. The right ventricular  size is normal. Tricuspid regurgitation signal is inadequate for assessing  PA pressure.   3. The mitral valve is normal in structure. No evidence of mitral valve  regurgitation. No evidence of mitral stenosis.   4. The aortic valve is tricuspid. Aortic valve regurgitation is not  visualized. No aortic stenosis is present.   5. The inferior vena cava is normal in size with greater than 50%  respiratory variability, suggesting right atrial pressure of 3 mmHg.   Comparison(s): No prior Echocardiogram.   LE [place] Summary:  BILATERAL:  - No evidence of deep vein thrombosis seen in the lower extremities,  bilaterally.  -No evidence of popliteal cyst, bilaterally.   Antimicrobials:  Anti-infectives (From admission, onward)    None       Subjective: Seen and examined.  Denies any complaints.  Prepare for procedure today.  Denies any chest pain or shortness of breath.  She is looking forward to go home.  Objective: Vitals:   11/24/22 0607 11/24/22 1048 11/24/22 2201 11/25/22 0610  BP: 119/84 (!) 145/97 137/85 102/81  Pulse: 78 89 77 70  Resp: '18 18 18 18  '$ Temp: 99.1 F (37.3 C) 98.4 F (36.9 C) 98.2 F (36.8 C) 98.8 F (37.1 C)  TempSrc: Oral Oral Oral Oral  SpO2:  98% 98% 95%  Weight:  87.2 kg   87 kg  Height:        Intake/Output Summary (Last 24 hours) at 11/25/2022 1055 Last data filed at 11/25/2022 1031 Gross per 24 hour  Intake 460.72 ml  Output 800 ml  Net -339.28 ml   Filed Weights   11/23/22 0452 11/24/22 0607 11/25/22 0610  Weight: 86.9 kg 87.2 kg 87 kg    Examination: General: Looks fairly comfortable.  On room air. Cardiovascular: S1-S2 normal.  Regular rate  rhythm. Respiratory: Bilateral clear.  No added sounds Gastrointestinal: Soft.  Nontender.  Bowel sounds present. Ext: No cyanosis or edema. Neuro: Intact Musculoskeletal: No deformities intact    Data Reviewed: I have personally reviewed following labs and imaging studies  CBC: Recent Labs  Lab 11/21/22 1018 11/22/22 0457 11/23/22 0039 11/24/22 0602 11/25/22 0102  WBC 11.2* 10.6* 8.7 8.1 8.8  NEUTROABS  --   --  4.7 4.6  --   HGB 11.8* 11.3* 11.0* 10.6* 10.9*  HCT 36.1 34.7* 33.1* 32.0* 32.6*  MCV 83.2 83.4 81.9 82.3 81.3  PLT 252 241 246 240 643    Basic Metabolic Panel: Recent Labs  Lab 11/21/22 1018 11/22/22 0457 11/23/22 0039 11/24/22 0602 11/25/22 0102  NA 137 139 136 138 135  K 4.2 3.5 3.3* 3.8 3.6  CL 98 103 102 104 101  CO2 '23 25 24 24 24  '$ GLUCOSE 193* 141* 202* 172* 155*  BUN '23 23 21 16 19  '$ CREATININE 1.11* 0.98 1.04* 1.02* 1.19*  CALCIUM 9.2 8.8* 8.6* 8.6* 8.7*  MG  --   --  1.8 1.7 1.7  PHOS  --   --  3.3 2.9 3.4    GFR: Estimated Creatinine Clearance: 43.5 mL/min (A) (by C-G formula based on SCr of 1.19 mg/dL (H)).  Liver Function Tests: Recent Labs  Lab 11/21/22 1018 11/23/22 0039 11/24/22 0602 11/25/22 0102  AST '20 15 18 19  '$ ALT '14 13 14 16  '$ ALKPHOS 64 55 57 60  BILITOT 0.5 0.5 0.6 0.4  PROT 7.4 6.5 6.3* 6.7  ALBUMIN 3.7 3.2* 3.2* 3.3*    CBG: Recent Labs  Lab 11/24/22 0600 11/24/22 1041 11/24/22 1541 11/24/22 2208 11/25/22 0736  GLUCAP 165* 213* 119* 171* 161*     No results found for this or any previous visit (from the past 240 hour(s)).       Radiology Studies: No results found.      Scheduled Meds:  amLODipine  2.5 mg Oral QHS   hydrochlorothiazide  25 mg Oral Daily   insulin aspart  0-15 Units Subcutaneous TID WC   losartan  100 mg Oral Daily   Continuous Infusions:  sodium chloride 20 mL/hr at 11/25/22 1031   heparin Stopped (11/25/22 0900)     LOS: 4 days    Time spent: 35 minutes      Barb Merino, MD [facility]    11/25/2022, 10:55 AM

## 2022-11-25 NOTE — Progress Notes (Signed)
[facility] for Eliquis >> Heparin Indication: pulmonary embolus  Allergies  Allergen Reactions   Lisinopril Cough    Patient Measurements: Height: '5\' 7"'$  (170.2 cm) Weight: 87.2 kg (192 lb 3.2 oz) IBW/kg (Calculated) : 61.6 Heparin Dosing Weight: 81 kg  Vital Signs: Temp: 98.2 F (36.8 C) (12/03 2201) Temp Source: Oral (12/03 2201) BP: 137/85 (12/03 2201) Pulse Rate: 77 (12/03 2201)  Labs: Recent Labs    11/22/22 1208 11/23/22 0039 11/24/22 0602 11/24/22 1111 11/25/22 0102  HGB  --  11.0* 10.6*  --  10.9*  HCT  --  33.1* 32.0*  --  32.6*  PLT  --  246 240  --  254  APTT  --   --  143* 113* 148*  HEPARINUNFRC <0.10*  --  >1.10*  --  >1.10*  CREATININE  --  1.04* 1.02*  --  1.19*     Estimated Creatinine Clearance: 43.5 mL/min (A) (by C-G formula based on SCr of 1.19 mg/dL (H)).   Assessment: 81 yo F on heparin for PE, transitioned to Eliquis 12/1, transitioned back to heparin 12/2, possible need for procedure (last apixaban dose taken 12/2 in am).   aPTT supratherapeutic at 148 seconds, Anti-Xa level remains elevated from Eliquis dose  Goal of Therapy:  Heparin level 0.3-0.7 Aptt goal 66-102s Monitor platelets by anticoagulation protocol: Yes   Plan: Hold heparin gtt x 1h, then restart at reduced rate of 1250 units/hr F/u 8 hour aPTT/HL  Bertis Ruddy, PharmD, [facility] Clinical Pharmacist ED Pharmacist Phone # [phone] 11/25/2022 2:15 AM

## 2022-11-25 NOTE — Transfer of Care (Signed)
Immediate Anesthesia Transfer of Care Note  Patient: JANNA OAK  Procedure(s) Performed: ESOPHAGOGASTRODUODENOSCOPY (EGD) WITH PROPOFOL UPPER ENDOSCOPIC ULTRASOUND (EUS) LINEAR (Left) FINE NEEDLE ASPIRATION (FNA) LINEAR  Patient Location: Endoscopy Unit  Anesthesia Type:MAC  Level of Consciousness: drowsy  Airway & Oxygen Therapy: Patient Spontanous Breathing and Patient connected to nasal cannula oxygen  Post-op Assessment: Report given to RN  Post vital signs: Reviewed and stable  Last Vitals:  Vitals Value Taken Time  BP 85/63 11/25/22 1333  Temp    Pulse 80 11/25/22 1334  Resp 12 11/25/22 1334  SpO2 93 % 11/25/22 1334  Vitals shown include unvalidated device data.  Last Pain:  Vitals:   11/25/22 1110  TempSrc: Temporal  PainSc: 0-No pain      Patients Stated Pain Goal: 0 (59/56/38 7564)  Complications: No notable events documented.

## 2022-11-25 NOTE — Anesthesia Preprocedure Evaluation (Addendum)
Anesthesia Evaluation  Patient identified by MRN, date of birth, ID band Patient awake    Reviewed: Allergy & Precautions, NPO status , Patient's Chart, lab work & pertinent test results  Airway Mallampati: II  TM Distance: >3 FB Neck ROM: Full    Dental  (+) Teeth Intact, Dental Advisory Given   Pulmonary PE   Pulmonary exam normal breath sounds clear to auscultation       Cardiovascular hypertension, Pt. on medications Normal cardiovascular exam Rhythm:Regular Rate:Normal     Neuro/Psych  PSYCHIATRIC DISORDERS Anxiety     negative neurological ROS     GI/Hepatic negative GI ROS,,,(+) Cirrhosis       , Hepatitis -, C pancreatic mass and metastatic lymphadenopathy   Endo/Other  diabetes, Type 2, Oral Hypoglycemic Agents    Renal/GU negative Renal ROS     Musculoskeletal negative musculoskeletal ROS (+)    Abdominal   Peds  Hematology negative hematology ROS (+)   Anesthesia Other Findings Day of surgery medications reviewed with the patient.  H/o right breast cancer   Reproductive/Obstetrics                             Anesthesia Physical Anesthesia Plan  ASA: 4  Anesthesia Plan: MAC   Post-op Pain Management: Minimal or no pain anticipated   Induction: Intravenous  PONV Risk Score and Plan: 2 and TIVA and Treatment may vary due to age or medical condition  Airway Management Planned: Natural Airway and Simple Face Mask  Additional Equipment:   Intra-op Plan:   Post-operative Plan:   Informed Consent: I have reviewed the patients History and Physical, chart, labs and discussed the procedure including the risks, benefits and alternatives for the proposed anesthesia with the patient or authorized representative who has indicated his/her understanding and acceptance.     Dental advisory given  Plan Discussed with: CRNA  Anesthesia Plan Comments:          Anesthesia Quick Evaluation

## 2022-11-25 NOTE — Op Note (Signed)
[facility] Patient Name: Victoria [name] Procedure Date : 11/25/2022 MRN: [id number] Attending MD: Carol Ada , MD, [id number] Date of Birth: Mar 05, 1941 CSN: [id number] Age: 81 Admit Type: Inpatient Procedure:                Upper EUS Indications:              Abnormal CT of the GI tract Providers:                Carol Ada, MD, Dulcy Fanny, Brien Mates, Technician Referring MD:              Medicines:                Propofol per Anesthesia Complications:            No immediate complications. Estimated Blood Loss:     Estimated blood loss: none. Procedure:                Pre-Anesthesia Assessment:                           - Prior to the procedure, a History and Physical                            was performed, and patient medications and                            allergies were reviewed. The patient's tolerance of                            previous anesthesia was also reviewed. The risks                            and benefits of the procedure and the sedation                            options and risks were discussed with the patient.                            All questions were answered, and informed consent                            was obtained. Prior Anticoagulants: The patient has                            taken heparin, last dose was day of procedure. ASA                            Grade Assessment: III - A patient with severe                            systemic disease. After reviewing the risks and  benefits, the patient was deemed in satisfactory                            condition to undergo the procedure.                           - Sedation was administered by an anesthesia                            professional. Deep sedation was attained.                           After obtaining informed consent, the endoscope was                            passed under direct vision.  Throughout the                            procedure, the patient's blood pressure, pulse, and                            oxygen saturations were monitored continuously. The                            [id number] ( [id number] ) was introduced through the                            mouth, and advanced to the second part of duodenum.                            After obtaining informed consent, the endoscope was                            passed under direct vision. Throughout the                            procedure, the patient's blood pressure, pulse, and                            oxygen saturations were monitored continuously.The                            upper GI endoscopy was technically difficult and                            complex. The patient tolerated the procedure well. Scope In: Scope Out: Findings:      ENDOSONOGRAPHIC FINDING: :      An irregular mass was identified in the pancreatic head. The mass was       hypoechoic. The mass measured 22 mm by 22 mm in maximal cross-sectional       diameter. The endosonographic borders were poorly-defined. The remainder       of the pancreas was examined. The endosonographic appearance of       parenchyma and the upstream pancreatic duct indicated  a maximum duct       diameter of 15 mm. Fine needle aspiration for cytology was performed.       Color Doppler imaging was utilized prior to needle puncture to confirm a       lack of significant vascular structures within the needle path. Two       passes were made with the 22 gauge needle using a transduodenal       approach. A stylet was used. A cytotechnologist was present to evaluate       the adequacy of the specimen. Final cytology results are pending.      A lesion was noted in the head of the pancreas. The irregularly bordered       area was hypoechogenic. There were cysts versus severe PD ductal       ectasia. The main PD was measured to be 15 mm at maximum length. In the       tail of  the pancreas there was clearly intraductal growth suspicious for       a main duct IPMN. There was no clear mucus noted in the second portion       of the duodenum, but a clear endoscopic view of the ampulla was not       obtained. There was evidence of some intraluminal PD growth in the head       of the pancreas near the mass. The mass measured to be 22 x 24 mm.       Multiple vessels were surrounding the area and it abutted the inferior       portion of the portal confluence and the superior portion of the SMV.       There was no evidence of any vascular involvement in this area. Only two       passes with the 22 gauge FNA needle were performed, cautiously. It is       not clear if adequate samples were obtained. Impression:               - A mass was identified in the pancreatic head.                            Fine needle aspiration performed. Recommendation:           - Return patient to hospital ward for ongoing care.                           - Resume regular diet.                           - Restart heparin at low dose at 2 hours and then                            titrate up to therapeutic dose. Procedure Code(s):        --- Professional ---                           [phone], Esophagogastroduodenoscopy, flexible,                            transoral; with transendoscopic ultrasound-guided  intramural or transmural fine needle                            aspiration/biopsy(s), (includes endoscopic                            ultrasound examination limited to the esophagus,                            stomach or duodenum, and adjacent structures) Diagnosis Code(s):        --- Professional ---                           K86.89, Other specified diseases of pancreas                           R93.3, Abnormal findings on diagnostic imaging of                            other parts of digestive tract CPT copyright 2022 American Medical Association. All rights  reserved. The codes documented in this report are preliminary and upon coder review may  be revised to meet current compliance requirements. Carol Ada, MD Carol Ada, MD 11/25/2022 1:48:09 PM This report has been signed electronically. Number of Addenda: 0

## 2022-11-26 ENCOUNTER — Other Ambulatory Visit (HOSPITAL_COMMUNITY): Payer: Self-pay

## 2022-11-26 DIAGNOSIS — I2601 Septic pulmonary embolism with acute cor pulmonale: Secondary | ICD-10-CM

## 2022-11-26 DIAGNOSIS — K869 Disease of pancreas, unspecified: Secondary | ICD-10-CM | POA: Diagnosis not present

## 2022-11-26 LAB — APTT
aPTT: 82 seconds — ABNORMAL HIGH (ref 24–36)
aPTT: 85 seconds — ABNORMAL HIGH (ref 24–36)

## 2022-11-26 LAB — CANCER ANTIGEN 19-9: CA 19-9: 5 U/mL (ref 0–35)

## 2022-11-26 LAB — CBC
HCT: 30.9 % — ABNORMAL LOW (ref 36.0–46.0)
Hemoglobin: 10.2 g/dL — ABNORMAL LOW (ref 12.0–15.0)
MCH: 27.4 pg (ref 26.0–34.0)
MCHC: 33 g/dL (ref 30.0–36.0)
MCV: 83.1 fL (ref 80.0–100.0)
Platelets: 244 10*3/uL (ref 150–400)
RBC: 3.72 MIL/uL — ABNORMAL LOW (ref 3.87–5.11)
RDW: 15.6 % — ABNORMAL HIGH (ref 11.5–15.5)
WBC: 8.2 10*3/uL (ref 4.0–10.5)
nRBC: 0 % (ref 0.0–0.2)

## 2022-11-26 LAB — HEPARIN LEVEL (UNFRACTIONATED): Heparin Unfractionated: 0.96 IU/mL — ABNORMAL HIGH (ref 0.30–0.70)

## 2022-11-26 LAB — GLUCOSE, CAPILLARY: Glucose-Capillary: 165 mg/dL — ABNORMAL HIGH (ref 70–99)

## 2022-11-26 MED ORDER — APIXABAN 5 MG PO TABS: 5.0000 mg | ORAL_TABLET | Freq: Two times a day (BID) | ORAL | 2 refills | Status: DC

## 2022-11-26 MED ORDER — APIXABAN (ELIQUIS) VTE STARTER PACK (10MG AND 5MG): ORAL_TABLET | ORAL | 0 refills | Status: DC

## 2022-11-26 MED ORDER — APIXABAN 5 MG PO TABS
10.0000 mg | ORAL_TABLET | Freq: Two times a day (BID) | ORAL | Status: DC
  Administered 2022-11-26: 10 mg via ORAL
  Filled 2022-11-26: qty 2

## 2022-11-26 MED ORDER — APIXABAN 5 MG PO TABS: 5.0000 mg | ORAL_TABLET | Freq: Two times a day (BID) | ORAL | Status: DC

## 2022-11-26 MED ORDER — APIXABAN (ELIQUIS) VTE STARTER PACK (10MG AND 5MG)
ORAL_TABLET | ORAL | 0 refills | Status: DC
  Filled 2022-11-26: qty 74, 30d supply, fill #0

## 2022-11-26 NOTE — Anesthesia Postprocedure Evaluation (Signed)
Anesthesia Post Note  Patient: Victoria [name]  Procedure(s) Performed: ESOPHAGOGASTRODUODENOSCOPY (EGD) WITH PROPOFOL UPPER ENDOSCOPIC ULTRASOUND (EUS) LINEAR (Left) FINE NEEDLE ASPIRATION (FNA) LINEAR     Patient location during evaluation: Endoscopy Anesthesia Type: MAC Level of consciousness: awake and alert Pain management: pain level controlled Vital Signs Assessment: post-procedure vital signs reviewed and stable Respiratory status: spontaneous breathing, nonlabored ventilation, respiratory function stable and patient connected to nasal cannula oxygen Cardiovascular status: blood pressure returned to baseline and stable Postop Assessment: no apparent nausea or vomiting Anesthetic complications: no  No notable events documented.  Last Vitals:  Vitals:   11/25/22 2202 11/26/22 0408  BP: 135/83 127/77  Pulse:  64  Resp:  18  Temp:  37.2 C  SpO2:  96%    Last Pain:  Vitals:   11/26/22 0408  TempSrc: Oral  PainSc:                  Chivas Notz L [name]

## 2022-11-26 NOTE — Progress Notes (Signed)
ANTICOAGULATION CONSULT NOTE - Initial Consult  Pharmacy Consult for transition from heparin to apixaban Indication: pulmonary embolus  Allergies  Allergen Reactions   Lisinopril Cough    Patient Measurements: Height: _0  (170.2 cm) Weight: 87.2 kg (192 lb 3.9 oz) IBW/kg (Calculated) : 61.6  Vital Signs: Temp: 99 F (37.2 C) (12/05 0408) Temp Source: Oral (12/05 0408) BP: 127/77 (12/05 0408) Pulse Rate: 64 (12/05 0408)  Labs: Recent Labs    11/24/22 0602 11/24/22 1111 11/25/22 0102 11/26/22 0051  HGB 10.6*  --  10.9* 10.2*  HCT 32.0*  --  32.6* 30.9*  PLT 240  --  254 244  APTT 143* 113* 148* 85*  82*  HEPARINUNFRC >1.10*  --  >1.10* 0.96*  CREATININE 1.02*  --  1.19*  --     Estimated Creatinine Clearance: 43.5 mL/min (A) (by C-G formula based on SCr of 1.19 mg/dL (H)).   Medical History: Past Medical History:  Diagnosis Date   Abnormal finding on Pap smear    Allergy    SEASONAL   Anxiety    Breast cancer ([place]) 2000   right breast lumptectomy, radiation done   Cirrhosis ([place]) 11/2012   resolved per pt   Diabetes mellitus type 2, controlled ([place])    Hepatitis C    took tx for    Hypertension    Obesity    Thrombocytopenia ([place]) 11-2012    Medications:  Medications Prior to Admission  Medication Sig Dispense Refill Last Dose   albuterol (VENTOLIN HFA) 108 (90 Base) MCG/ACT inhaler Inhale 2 puffs into the lungs every 6 (six) hours as needed for wheezing or shortness of breath. 8 g PRN Past Month   ALPRAZolam (XANAX) 0.5 MG tablet TAKE 1 TABLET(0.5 MG) BY MOUTH TWICE DAILY AS NEEDED FOR ANXIETY (Patient taking differently: Take 0.5 mg by mouth 2 (two) times daily.) 60 tablet 5 11/23/2022   amLODipine (NORVASC) 2.5 MG tablet TAKE 1 TABLET(2.5 MG) BY MOUTH AT BEDTIME (Patient taking differently: Take 2.5 mg by mouth every evening.) 90 tablet 1 11/23/2022   amoxicillin-clavulanate (AUGMENTIN) 500-125 MG tablet Take 1 tablet by mouth 3 (three) times daily.  30 tablet 0 11/21/2022   Cetirizine HCl (ZYRTEC ALLERGY PO) Take 1 tablet by mouth daily as needed (allergies).    Past Week   ibuprofen (ADVIL) 100 MG tablet Take 100 mg by mouth every 6 (six) hours as needed for pain or fever.   Past Month   losartan-hydrochlorothiazide (HYZAAR) 100-25 MG tablet Take 1 tablet by mouth daily. 90 tablet 3 11/21/2022   metFORMIN (GLUCOPHAGE) 500 MG tablet TAKE 1 TABLET(500 MG) BY MOUTH TWICE DAILY WITH A MEAL (Patient taking differently: Take 500 mg by mouth 2 (two) times daily with a meal.) 180 tablet 2 11/23/2022   Blood Glucose Monitoring Suppl (TRUE METRIX METER) w/Device KIT USE AS DIRECTED 1 kit prn    cholecalciferol (VITAMIN D3) 25 MCG (1000 UNIT) tablet Take 1,000 Units by mouth daily.   11/21/2022   glucose blood (TRUE METRIX BLOOD GLUCOSE TEST) test strip TEST BLOOD SUGAR TWICE DAILY 200 strip prn    [EXPIRED] HYDROcodone-acetaminophen (NORCO) 10-325 MG tablet Take 1 tablet by mouth every 8 (eight) hours as needed for up to 5 days. (Patient not taking: Reported on 11/23/2022) 15 tablet 0 Not Taking   Lancets (FREESTYLE) lancets CHECK GLUCOSE TWICE A DAY 100 each 12    meclizine (ANTIVERT) 25 MG tablet Take 1 tablet (25 mg total) by mouth 3 (three) times  daily as needed for dizziness. (Patient not taking: Reported on 11/23/2022) 60 tablet PRN Not Taking    Assessment: 81 yo F on heparin for PE, transitioned to Apixaban on 12/1, then transitioned back to heparin on 12/2.  Pharmacy consulted to transition back to apixaban 12/5. Patient did receive 2 doses of the 10 mg apixaban before going back to heparin on 12/2 evening.  Goal of Therapy:  Therapeutic anticoagulation Monitor platelets by anticoagulation protocol: Yes   Plan:  Stop heparin infusion Restart apixaban 10 mg PO BID x 6 days, followed by apixaban 5 mg PO BID thereafter. Monitor for signs and symptoms of bleeding    Thank you for allowing [place] to participate in this patients care. Jens Som, PharmD 11/26/2022 7:50 AM  **Pharmacist phone directory can be found on [email or web address] listed under [place]**

## 2022-11-26 NOTE — Progress Notes (Signed)
Medications brought from pharmacy and given to pt. Discharge instructions given to pt and AVS packet. Peripheral iv removed.  Pt calling her ride.  Volunteers called to bring pt to discharge lounge.

## 2022-11-26 NOTE — Progress Notes (Signed)
ANTICOAGULATION CONSULT NOTE - Follow Up Consult  Pharmacy Consult for Heparin Indication: pulmonary embolus  Allergies  Allergen Reactions   Lisinopril Cough    Patient Measurements: Height: '5\' 7"'$  (170.2 cm) Weight: 87 kg (191 lb 12.8 oz) IBW/kg (Calculated) : 61.6 Heparin Dosing Weight: 81 kg  Vital Signs: Temp: 98.1 F (36.7 C) (12/04 1939) Temp Source: Oral (12/04 1939) BP: 135/83 (12/04 2202) Pulse Rate: 80 (12/04 1939)  Labs: Recent Labs    11/24/22 0602 11/24/22 1111 11/25/22 0102 11/26/22 0051  HGB 10.6*  --  10.9* 10.2*  HCT 32.0*  --  32.6* 30.9*  PLT 240  --  254 244  APTT 143* 113* 148* 82*  HEPARINUNFRC >1.10*  --  >1.10*  --   CREATININE 1.02*  --  1.19*  --      Estimated Creatinine Clearance: 43.5 mL/min (A) (by C-G formula based on SCr of 1.19 mg/dL (H)).  Assessment: 81 yo F on heparin for PE, transitioned to Apixaban on 12/1, then transitioned back to heparin on 12/2 with possible need for procedure. Last apixaban dose taken 12/2 am.  Using aPTTs for monitoring until correlating with heparin levels, while heparin levels are falsely elevated from recent Apixaban doses.  aPTT therapeutic s/p resuming gtt at reduced rate of 1250 units/hr  Goal of Therapy:  Heparin level 0.3-0.7 units/ml aPTT 66-102 seconds Monitor platelets by anticoagulation protocol: Yes   Plan:  Continue heparin gtt at 1250 units/hr F/u 8 hour aPTT/HL to confirm  Bertis Ruddy, PharmD, [place] Pharmacist ED Pharmacist Phone # [phone] 11/26/2022 2:38 AM

## 2022-11-26 NOTE — Discharge Summary (Signed)
Physician Discharge Summary  Victoria [name] MRN:[id number] DOB: 08/18/1941 DOA: 11/21/2022  PCP: [name], Victoria J, MD  Admit date: 11/21/2022 Discharge date: 11/26/2022  Admitted From: Home Disposition: Home  Recommendations for Outpatient Follow-up:  Follow up with PCP in 1-2 weeks We will send referral to cancer center for follow-up  Discharge Condition: Stable CODE STATUS: Full code Diet recommendation: Low-salt diet  Discharge summary: Victoria [name] is a 81 y.o. female with a past medical history of right breast cancer, essential hypertension, diabetes mellitus type 2, previous history of pancreatitis, history of pancreatic lesion who was admitted with left-sided pleuritic chest pain and found to have a submassive PE.  She's stable on anticoagulation.  Imaging showed findings concerning for pancreatic mass and metastatic lymphadenopathy, underwent  EUS with FNA on 12/4 with Dr. Hung.  Biopsies are pending.     Assessment & Plan:   Principal Problem:   Acute pulmonary embolism (HCC) Active Problems:   Diabetes mellitus (HCC)   Hypertension   History of breast cancer   Pancreatic lesion   #1. Submassive Pulmonary Embolism:  Likely underlying pancreatic cancer. Echo with normal RVSF LE US negative for DVT Discharging on Eliquis 10 mg twice daily for 7 days followed by 5 mg twice daily to continue until further follow-up.  Education, counseling, drug monitoring.   #2.  Abnormal pancreatic lesion: Suspected pancreatic cancer.   Abnormal MRCP, ERCP and EUS and biopsy 12/4 by Dr. Hung. - CA 19-9 was 5. will send referral to oncology awaiting biopsy.   #3.  Diabetes mellitus type 2: Aq1c 7.  Resume metformin on discharge.    #5.  Essential hypertension: Continue with amlodipine, HCTZ and losartan.  Medically stable.  Tolerating Eliquis.   Discharge Diagnoses:  Principal Problem:   Acute pulmonary embolism (HCC) Active Problems:   Diabetes mellitus (HCC)    Hypertension   History of breast cancer   Pancreatic lesion    Discharge Instructions  Discharge Instructions     Ambulatory referral to Hematology / Oncology   Complete by: As directed    Call MD for:  persistant nausea and vomiting   Complete by: As directed    Call MD for:  severe uncontrolled pain   Complete by: As directed    Diet - low sodium heart healthy   Complete by: As directed    Diet Carb Modified   Complete by: As directed    Increase activity slowly   Complete by: As directed       Allergies as of 11/26/2022       Reactions   Lisinopril Cough        Medication List     STOP taking these medications    amoxicillin-clavulanate 500-125 MG tablet Commonly known as: Augmentin   HYDROcodone-acetaminophen 10-325 MG tablet Commonly known as: NORCO   ibuprofen 100 MG tablet Commonly known as: ADVIL   meclizine 25 MG tablet Commonly known as: ANTIVERT       TAKE these medications    albuterol 108 (90 Base) MCG/ACT inhaler Commonly known as: VENTOLIN HFA Inhale 2 puffs into the lungs every 6 (six) hours as needed for wheezing or shortness of breath.   ALPRAZolam 0.5 MG tablet Commonly known as: XANAX TAKE 1 TABLET(0.5 MG) BY MOUTH TWICE DAILY AS NEEDED FOR ANXIETY What changed: See the new instructions.   amLODipine 2.5 MG tablet Commonly known as: NORVASC TAKE 1 TABLET(2.5 MG) BY MOUTH AT BEDTIME What changed: See the new instructions.     Eliquis DVT/PE Starter Pack Generic drug: Apixaban Starter Pack (10mg and 5mg) Take as directed on package: start with two-5mg tablets twice daily for 7 days. On day 8, switch to one-5mg tablet twice daily.   apixaban 5 MG Tabs tablet Commonly known as: ELIQUIS Take 1 tablet (5 mg total) by mouth 2 (two) times daily. Start taking on: December 27, 2022   cholecalciferol 25 MCG (1000 UNIT) tablet Commonly known as: VITAMIN D3 Take 1,000 Units by mouth daily.   freestyle lancets CHECK GLUCOSE TWICE A  DAY   losartan-hydrochlorothiazide 100-25 MG tablet Commonly known as: HYZAAR Take 1 tablet by mouth daily.   metFORMIN 500 MG tablet Commonly known as: GLUCOPHAGE TAKE 1 TABLET(500 MG) BY MOUTH TWICE DAILY WITH A MEAL What changed:  how much to take how to take this when to take this additional instructions   True Metrix Blood Glucose Test test strip Generic drug: glucose blood TEST BLOOD SUGAR TWICE DAILY   True Metrix Meter w/Device Kit USE AS DIRECTED   ZYRTEC ALLERGY PO Take 1 tablet by mouth daily as needed (allergies).        Allergies  Allergen Reactions   Lisinopril Cough    Consultations: Gastroenterology   Procedures/Studies: VAS US LOWER EXTREMITY VENOUS (DVT)  Result Date: 11/23/2022  Lower Venous DVT Study Patient Name:  Victoria [name]  Date of Exam:   11/22/2022 Medical Rec #: [id number]          Accession #:    [id number] Date of Birth: 10/27/1941          Patient Gender: F Patient Age:   81 years Exam Location:  [facility] Procedure:      VAS US LOWER EXTREMITY VENOUS (DVT) Referring Phys: Victoria [name] --------------------------------------------------------------------------------  Indications: Pulmonary embolism.  Comparison Study: no prior Performing Technologist: Megan Stricklin RVS  Examination Guidelines: A complete evaluation includes B-mode imaging, spectral Doppler, color Doppler, and power Doppler as needed of all accessible portions of each vessel. Bilateral testing is considered an integral part of a complete examination. Limited examinations for reoccurring indications may be performed as noted. The reflux portion of the exam is performed with the patient in reverse Trendelenburg.  +---------+---------------+---------+-----------+----------+--------------+ RIGHT    CompressibilityPhasicitySpontaneityPropertiesThrombus Aging +---------+---------------+---------+-----------+----------+--------------+ CFV      Full           Yes       Yes                                 +---------+---------------+---------+-----------+----------+--------------+ SFJ      Full                                                        +---------+---------------+---------+-----------+----------+--------------+ FV Prox  Full                                                        +---------+---------------+---------+-----------+----------+--------------+ FV Mid   Full                                                        +---------+---------------+---------+-----------+----------+--------------+   FV DistalFull                                                        +---------+---------------+---------+-----------+----------+--------------+ PFV      Full                                                        +---------+---------------+---------+-----------+----------+--------------+ POP      Full           Yes      Yes                                 +---------+---------------+---------+-----------+----------+--------------+ PTV      Full                                                        +---------+---------------+---------+-----------+----------+--------------+ PERO     Full                                                        +---------+---------------+---------+-----------+----------+--------------+   +---------+---------------+---------+-----------+----------+--------------+ LEFT     CompressibilityPhasicitySpontaneityPropertiesThrombus Aging +---------+---------------+---------+-----------+----------+--------------+ CFV      Full           Yes      Yes                                 +---------+---------------+---------+-----------+----------+--------------+ SFJ      Full                                                        +---------+---------------+---------+-----------+----------+--------------+ FV Prox  Full                                                         +---------+---------------+---------+-----------+----------+--------------+ FV Mid   Full                                                        +---------+---------------+---------+-----------+----------+--------------+ FV DistalFull                                                        +---------+---------------+---------+-----------+----------+--------------+  PFV      Full                                                        +---------+---------------+---------+-----------+----------+--------------+ POP      Full           Yes      Yes                                 +---------+---------------+---------+-----------+----------+--------------+ PTV      Full                                                        +---------+---------------+---------+-----------+----------+--------------+ PERO     Full           Yes      Yes                                 +---------+---------------+---------+-----------+----------+--------------+     Summary: BILATERAL: - No evidence of deep vein thrombosis seen in the lower extremities, bilaterally. -No evidence of popliteal cyst, bilaterally.   *See table(s) above for measurements and observations. Electronically signed by Orlie Pollen on 11/23/2022 at 1:25:52 PM.    Final    MR ABDOMEN MRCP W WO CONTAST  Result Date: 11/23/2022 CLINICAL DATA:  81 year old female with abnormal appearance of the pancreas on prior chest CTA examination. Follow-up study. EXAM: MRI ABDOMEN WITHOUT AND WITH CONTRAST (INCLUDING MRCP) TECHNIQUE: Multiplanar multisequence MR imaging of the abdomen was performed both before and after the administration of intravenous contrast. Heavily T2-weighted images of the biliary and pancreatic ducts were obtained, and three-dimensional MRCP images were rendered by post processing. CONTRAST:  66m GADAVIST GADOBUTROL 1 MMOL/ML IV SOLN COMPARISON:  Abdominal MRI 11/14/2014.  Chest CTA 11/21/2022. FINDINGS: Comment:  Portions of today's examination are limited by considerable patient motion. Lower chest: Unremarkable. Hepatobiliary: Mild diffuse loss of signal intensity throughout the hepatic parenchyma on out of phase dual echo images, indicative of a background of mild hepatic steatosis. Liver has a shrunken appearance and nodular contour, indicative of underlying cirrhosis. During arterial phase imaging there are several ill-defined peripheral predominant areas of mild hyperenhancement scattered throughout all aspects of the liver, which are not associated with signal abnormalities on precontrast imaging, not associated with diffusion restriction, and completely normalize in appearance on later post gadolinium imaging, most compatible with benign perfusion anomalies (transient hepatic intensity differences (THIDs)). No other suspicious appearing hepatic lesions are noted. Status post cholecystectomy. No intrahepatic biliary ductal dilatation. Common bile duct measures 9 mm within the porta hepatis, within normal limits given the patient's advanced age and post cholecystectomy status. No filling defect in the common bile duct to suggest choledocholithiasis. Pancreas: Increased number and size of what are now innumerable small T1 hypointense and T2 hyperintense lesions scattered throughout the pancreas, largest of which is in the anterior aspect of the neck of the pancreas (axial image 28 of series 4) measuring 1.7 x 1.3 cm. MRCP images demonstrate severe dilatation of the main pancreatic duct,  which currently measures up to 1.4 cm in diameter in the body of the pancreas. There is abrupt cut off of the duct in the region of the pancreatic neck best appreciated on axial image 26 of series 4 and coronal image 25 of series 5. No discrete pancreatic lesion confidently identified in this region (portions of today's examination are substantially limited by patient motion), however, there does appear to be a small focus of diffusion  restriction in this region (axial image 93 of series 9). No peripancreatic fluid collections or inflammatory changes. Spleen:  Unremarkable. Adrenals/Urinary Tract: T1 hypointense, T2 hyperintense, nonenhancing lesions in both kidneys are compatible with simple (Bosniak class 1) cysts (no imaging follow-up recommended), measuring up to 2.1 cm in the lower pole of the left kidney. No aggressive appearing renal lesions are noted. No hydroureteronephrosis in the visualized portions of the abdomen. Bilateral adrenal glands are normal in appearance. Stomach/Bowel: Innumerable colonic diverticuli are noted in the visualized portions of the colon. Vascular/Lymphatic: Aortic atherosclerosis, without definite aneurysm in the abdominal vasculature. Multiple borderline enlarged and enlarged lymph nodes are noted in the upper abdomen and retroperitoneum, many of which demonstrate restricted diffusion. The largest of these are in the region of the hepatoduodenal ligament and portacaval nodal stations measuring up to 1.9 cm in short axis (axial image 35 of series 22). The largest retroperitoneal lymph node is in the left para-aortic nodal station (axial image 66 of series 22) measuring 1 cm in short axis. Other: No significant volume of ascites noted in the visualized portions of the peritoneal cavity. Musculoskeletal: No aggressive appearing osseous lesions are noted in the visualized portions of the skeleton. IMPRESSION: 1. Marked change in the appearance of the pancreas, now characterized by diffuse ductal dilatation throughout the body and tail of the pancreas with abrupt cut off of the duct in the region of the pancreatic neck where there is a small focus of diffusion restriction. Although no discrete obstructing pancreatic mass is confidently identified on today's motion limited examination, an obstructing lesion is strongly suspected. Further evaluation with endoscopic ultrasound and potential tissue sampling is strongly  recommended in the near future to better evaluate these findings. 2. Multiple prominent borderline enlarged and enlarged retroperitoneal and upper abdominal lymph nodes, most evident in the hepato duodenal ligament, many of which demonstrate diffusion restriction, concerning for metastatic lymphadenopathy. 3. Morphologic changes in the liver indicative of underlying cirrhosis. No aggressive appearing hepatic lesions are noted on today's examination. 4. Severe colonic diverticulosis. 5. Additional incidental findings, as above. Electronically Signed   By: Vinnie Langton M.D.   On: 11/23/2022 08:21   ECHOCARDIOGRAM COMPLETE  Result Date: 11/22/2022    ECHOCARDIOGRAM REPORT   Patient Name:   PAVNEET MARKWOOD Date of Exam: 11/22/2022 Medical Rec #:  [id number]         Height:       67.0 in Accession #:    [id number]        Weight:       192.1 lb Date of Birth:  [birth year]         BSA:          1.988 m Patient Age:    25 years          BP:           192/99 mmHg Patient Gender: F                 HR:  66 bpm. Exam Location:  Inpatient Procedure: 2D Echo, Cardiac Doppler and Color Doppler Indications:    Pulmonary Embolus I26.09  History:        Patient has no prior history of Echocardiogram examinations.                 P.E.; Risk Factors:Hypertension, Diabetes and Non-Smoker.  Sonographer:    Greer Pickerel Referring Phys: [id number] Bonnielee Haff  Sonographer Comments: Image acquisition challenging due to respiratory motion. IMPRESSIONS  1. Left ventricular ejection fraction, by estimation, is 70 to 75%. The left ventricle has hyperdynamic function. The left ventricle has no regional wall motion abnormalities. Left ventricular diastolic parameters are consistent with Grade I diastolic dysfunction (impaired relaxation).  2. Right ventricular systolic function is normal. The right ventricular size is normal. Tricuspid regurgitation signal is inadequate for assessing PA pressure.  3. The mitral valve is normal in  structure. No evidence of mitral valve regurgitation. No evidence of mitral stenosis.  4. The aortic valve is tricuspid. Aortic valve regurgitation is not visualized. No aortic stenosis is present.  5. The inferior vena cava is normal in size with greater than 50% respiratory variability, suggesting right atrial pressure of 3 mmHg. Comparison(s): No prior Echocardiogram. FINDINGS  Left Ventricle: Left ventricular ejection fraction, by estimation, is 70 to 75%. The left ventricle has hyperdynamic function. The left ventricle has no regional wall motion abnormalities. The left ventricular internal cavity size was normal in size. There is no left ventricular hypertrophy. Left ventricular diastolic parameters are consistent with Grade I diastolic dysfunction (impaired relaxation). Right Ventricle: The right ventricular size is normal. Right ventricular systolic function is normal. Tricuspid regurgitation signal is inadequate for assessing PA pressure. The tricuspid regurgitant velocity is 1.12 m/s, and with an assumed right atrial  pressure of 3 mmHg, the estimated right ventricular systolic pressure is 8.0 mmHg. Left Atrium: Left atrial size was normal in size. Right Atrium: Right atrial size was normal in size. Pericardium: There is no evidence of pericardial effusion. Mitral Valve: The mitral valve is normal in structure. Mild mitral annular calcification. No evidence of mitral valve regurgitation. No evidence of mitral valve stenosis. Tricuspid Valve: The tricuspid valve is normal in structure. Tricuspid valve regurgitation is trivial. No evidence of tricuspid stenosis. Aortic Valve: The aortic valve is tricuspid. Aortic valve regurgitation is not visualized. No aortic stenosis is present. Pulmonic Valve: The pulmonic valve was normal in structure. Pulmonic valve regurgitation is not visualized. No evidence of pulmonic stenosis. Aorta: The aortic root is normal in size and structure. Venous: The inferior vena cava  is normal in size with greater than 50% respiratory variability, suggesting right atrial pressure of 3 mmHg. IAS/Shunts: No atrial level shunt detected by color flow Doppler.  LEFT VENTRICLE PLAX 2D LVIDd:         3.90 cm   Diastology LVIDs:         2.20 cm   LV e' medial:    4.97 cm/s LV PW:         1.00 cm   LV E/e' medial:  13.0 LV IVS:        1.10 cm   LV e' lateral:   9.01 cm/s LVOT diam:     2.10 cm   LV E/e' lateral: 7.2 LV SV:         76 LV SV Index:   38 LVOT Area:     3.46 cm  RIGHT VENTRICLE RV S prime:     11.30  cm/s TAPSE (M-mode): 1.8 cm LEFT ATRIUM           Index        RIGHT ATRIUM           Index LA diam:      2.40 cm 1.21 cm/m   RA Area:     10.70 cm LA Vol (A2C): 74.5 ml 37.48 ml/m  RA Volume:   19.50 ml  9.81 ml/m LA Vol (A4C): 52.2 ml 26.26 ml/m  AORTIC VALVE LVOT Vmax:   97.70 cm/s LVOT Vmean:  67.300 cm/s LVOT VTI:    0.219 m  AORTA Ao Root diam: 3.40 cm Ao Asc diam:  3.20 cm MITRAL VALVE               TRICUSPID VALVE MV Area (PHT): 2.80 cm    TR Peak grad:   5.0 mmHg MV Decel Time: 271 msec    TR Vmax:        112.00 cm/s MV E velocity: 64.70 cm/s MV A velocity: 88.70 cm/s  SHUNTS MV E/A ratio:  0.73        Systemic VTI:  0.22 m                            Systemic Diam: 2.10 cm Brian Crenshaw MD Electronically signed by Brian Crenshaw MD Signature Date/Time: 11/22/2022/2:43:53 PM    Final    CT Angio Chest PE W and/or Wo Contrast  Result Date: 11/21/2022 CLINICAL DATA:  Pulmonary embolism. EXAM: CT ANGIOGRAPHY CHEST WITH CONTRAST TECHNIQUE: Multidetector CT imaging of the chest was performed using the standard protocol during bolus administration of intravenous contrast. Multiplanar CT image reconstructions and MIPs were obtained to evaluate the vascular anatomy. RADIATION DOSE REDUCTION: This exam was performed according to the departmental dose-optimization program which includes automated exposure control, adjustment of the mA and/or kV according to patient size and/or use of  iterative reconstruction technique. CONTRAST:  75mL OMNIPAQUE IOHEXOL 350 MG/ML SOLN COMPARISON:  None Available. FINDINGS: Cardiovascular: There is moderate severity calcification of the aortic arch, without evidence of aortic aneurysm. Satisfactory opacification of the pulmonary arteries to the segmental level. A mild-to-moderate amount of pulmonary embolism is seen within the distal aspect of the right pulmonary artery. Extension into the proximal portions of the lower lobe branches of the right pulmonary artery is noted. Normal heart size with right heart strain (RV/LV ratio of 1.28). No pericardial effusion. Mediastinum/Nodes: No enlarged mediastinal, hilar, or axillary lymph nodes. The left lobe of the thyroid gland is enlarged and heterogeneous in appearance. The trachea and esophagus demonstrate no significant findings. Lungs/Pleura: Mild, slightly nodular appearing areas of focal scarring and/or atelectasis are seen along the periphery of both lungs. This is slightly more prominent within the bilateral lower lobes. There is no evidence of a pleural effusion or pneumothorax. Upper Abdomen: Multiple surgical clips are seen within the gallbladder fossa. A 4.0 cm x 4.7 cm heterogeneous soft tissue mass is suspected within the region of the head of the pancreas. This area is limited in evaluation in the absence of intravenous contrast. Noninflamed diverticula are seen throughout the large bowel. Musculoskeletal: Diffusely sclerotic vertebral bodies are seen with multilevel degenerative changes noted throughout the thoracic spine. Review of the MIP images confirms the above findings. IMPRESSION: 1. Mild-to-moderate amount of pulmonary embolism within the distal aspect of the right pulmonary artery. 2. Right heart strain with an RV/LV ratio of 1.28. 3. Mild, slightly   nodular appearing areas of peripheral, bilateral lobe scarring and/or atelectasis. 4. Colonic diverticulosis. 5. Findings may represent a soft tissue  mass within the region of the head of the pancreas. Further evaluation with MRI is recommended, as an underlying neoplastic process cannot be excluded. 6. Aortic atherosclerosis. Aortic Atherosclerosis (ICD10-I70.0). Electronically Signed   By: Thaddeus  Houston M.D.   On: 11/21/2022 19:40   DG Chest 2 View  Result Date: 11/21/2022 CLINICAL DATA:  Chest pain. EXAM: CHEST - 2 VIEW COMPARISON:  November 12, 2021. FINDINGS: The heart size and mediastinal contours are within normal limits. Both lungs are clear. The visualized skeletal structures are unremarkable. IMPRESSION: No active cardiopulmonary disease. Electronically Signed   By: James  Green Jr M.D.   On: 11/21/2022 10:40   (Echo, Carotid, EGD, Colonoscopy, ERCP)    Subjective: Patient seen and examined.  No overnight events.  Walking around in the hallway and excited to go home.  Denies any abdominal pain nausea vomiting.  Denies any chest pain or shortness of breath.   Discharge Exam: Vitals:   11/26/22 0408 11/26/22 0820  BP: 127/77 121/85  Pulse: 64 100  Resp: 18 20  Temp: 99 F (37.2 C) 98.8 F (37.1 C)  SpO2: 96% 93%   Vitals:   11/25/22 1939 11/25/22 2202 11/26/22 0408 11/26/22 0820  BP: (!) 148/86 135/83 127/77 121/85  Pulse: 80  64 100  Resp: 18  18 20  Temp: 98.1 F (36.7 C)  99 F (37.2 C) 98.8 F (37.1 C)  TempSrc: Oral  Oral Oral  SpO2: 94%  96% 93%  Weight:   87.2 kg   Height:        General: Pt is alert, awake, not in acute distress On room air.  Walking in the hallway. Bruising of the left arm from IV lines. Cardiovascular: RRR, S1/S2 +, no rubs, no gallops Respiratory: CTA bilaterally, no wheezing, no rhonchi Abdominal: Soft, NT, ND, bowel sounds + Extremities: no edema, no cyanosis    The results of significant diagnostics from this hospitalization (including imaging, microbiology, ancillary and laboratory) are listed below for reference.     Microbiology: No results found for this or any  previous visit (from the past 240 hour(s)).   Labs: BNP (last 3 results) No results for input(s): "BNP" in the last 8760 hours. Basic Metabolic Panel: Recent Labs  Lab 11/21/22 1018 11/22/22 0457 11/23/22 0039 11/24/22 0602 11/25/22 0102  NA 137 139 136 138 135  K 4.2 3.5 3.3* 3.8 3.6  CL 98 103 102 104 101  CO2 23 25 24 24 24  GLUCOSE 193* 141* 202* 172* 155*  BUN 23 23 21 16 19  CREATININE 1.11* 0.98 1.04* 1.02* 1.19*  CALCIUM 9.2 8.8* 8.6* 8.6* 8.7*  MG  --   --  1.8 1.7 1.7  PHOS  --   --  3.3 2.9 3.4   Liver Function Tests: Recent Labs  Lab 11/21/22 1018 11/23/22 0039 11/24/22 0602 11/25/22 0102  AST 20 15 18 19  ALT 14 13 14 16  ALKPHOS 64 55 57 60  BILITOT 0.5 0.5 0.6 0.4  PROT 7.4 6.5 6.3* 6.7  ALBUMIN 3.7 3.2* 3.2* 3.3*   No results for input(s): "LIPASE", "AMYLASE" in the last 168 hours. No results for input(s): "AMMONIA" in the last 168 hours. CBC: Recent Labs  Lab 11/22/22 0457 11/23/22 0039 11/24/22 0602 11/25/22 0102 11/26/22 0051  WBC 10.6* 8.7 8.1 8.8 8.2  NEUTROABS  --  4.7 4.6  --   --     HGB 11.3* 11.0* 10.6* 10.9* 10.2*  HCT 34.7* 33.1* 32.0* 32.6* 30.9*  MCV 83.4 81.9 82.3 81.3 83.1  PLT 241 246 240 254 244   Cardiac Enzymes: No results for input(s): "CKTOTAL", "CKMB", "CKMBINDEX", "TROPONINI" in the last 168 hours. BNP: Invalid input(s): "POCBNP" CBG: Recent Labs  Lab 11/25/22 0736 11/25/22 1119 11/25/22 1601 11/25/22 2204 11/26/22 0607  GLUCAP 161* 108* 96 173* 165*   D-Dimer No results for input(s): "DDIMER" in the last 72 hours. Hgb A1c No results for input(s): "HGBA1C" in the last 72 hours. Lipid Profile No results for input(s): "CHOL", "HDL", "LDLCALC", "TRIG", "CHOLHDL", "LDLDIRECT" in the last 72 hours. Thyroid function studies No results for input(s): "TSH", "T4TOTAL", "T3FREE", "THYROIDAB" in the last 72 hours.  Invalid input(s): "FREET3" Anemia work up No results for input(s): "VITAMINB12", "FOLATE",  "FERRITIN", "TIBC", "IRON", "RETICCTPCT" in the last 72 hours. Urinalysis    Component Value Date/Time   COLORURINE YELLOW 01/22/2022 0153   APPEARANCEUR CLEAR 01/22/2022 0153   LABSPEC 1.015 01/22/2022 0153   PHURINE 6.0 01/22/2022 0153   GLUCOSEU NEGATIVE 01/22/2022 0153   HGBUR NEGATIVE 01/22/2022 0153   BILIRUBINUR NEGATIVE 01/22/2022 0153   BILIRUBINUR NEG 11/30/2020 1135   KETONESUR NEGATIVE 01/22/2022 0153   PROTEINUR NEGATIVE 01/22/2022 0153   UROBILINOGEN 0.2 11/30/2020 1135   UROBILINOGEN 2.0 (H) 11/22/2008 0930   NITRITE NEGATIVE 01/22/2022 0153   LEUKOCYTESUR SMALL (A) 01/22/2022 0153   Sepsis Labs Recent Labs  Lab 11/23/22 0039 11/24/22 0602 11/25/22 0102 11/26/22 0051  WBC 8.7 8.1 8.8 8.2   Microbiology No results found for this or any previous visit (from the past 240 hour(s)).   Time coordinating discharge: 35 minutes  SIGNED:   Kuber Ghimire, MD  [facility] 11/26/2022, 2:52 PM  

## 2022-11-26 NOTE — Care Management Important Message (Signed)
Important Message  Patient Details  Name: Victoria [name] MRN: [id number] Date of Birth: 03/16/1941   Medicare Important Message Given:  Yes     Shelda Altes 11/26/2022, 9:27 AM

## 2022-11-26 NOTE — Progress Notes (Signed)
   11/26/22 1000  Mobility  Activity Ambulated independently in hallway  Level of Assistance Independent  Assistive Device None  Distance Ambulated (ft) 550 ft  Activity Response Tolerated well  Mobility Referral Yes  $Mobility charge 1 Mobility   Mobility Specialist Progress Note  Received pt in bed having no complaints and agreeable to mobility. Pt was asymptomatic throughout ambulation and returned to room w/o fault. Left in bed w/ call bell in reach and all needs met.   Lucious Groves Mobility Specialist  Please contact via SecureChat or Rehab office at [phone]

## 2022-11-27 LAB — CYTOLOGY - NON PAP

## 2022-11-28 ENCOUNTER — Telehealth: Payer: Self-pay

## 2022-11-28 ENCOUNTER — Telehealth: Payer: Self-pay | Admitting: Internal Medicine

## 2022-11-28 ENCOUNTER — Encounter: Payer: Self-pay | Admitting: Internal Medicine

## 2022-11-28 NOTE — Telephone Encounter (Signed)
-----   Message from Irving Copas., MD sent at 11/28/2022  4:26 PM EST ----- Regarding: RE: pathology results [place] and I had discussed her case together earlier in the week. That is unfortunate that the sampling was nondiagnostic. Interesting that her CA 19-9 is normal. 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 I am happy to reevaluate, but unfortunately unless I have a cancellation I have no availability for an EUS until January 15. I can certainly get her scheduled for that day. If something opens up sooner I am happy to try and get her in. Otherwise if we need a more urgent EUS will need to see if Dr. Paulita Fujita may be available or send out to a quaternary center. Thanks. GM  Berkeley Veldman, Go ahead and get this patient scheduled for my next available EUS slot today. Thanks. GM  WO, This is a patient Saralyn Pilar attempted EUS earlier this week that came in with abnormal imaging.  Since he was on over the weekend he performed EUS on Monday but looks like it was nondiagnostic.  Did not know your availability for potential EUS.  I will get her on for January 15, but if you had something sooner that may be available that may help the patient and our oncology team.  Prescient any availability should you have any. Thanks. GM  ----- Message ----- From: Carol Ada, MD Sent: 11/28/2022   3:47 PM EST To: Truitt Merle, MD; Irving Copas., MD; # Subject: RE: pathology results                          Santiago Glad,   Her procedure was difficult.  Dr. 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  I stepped in as I was asked to help expedite her care.    Gabe, will you be able to try the EUS with FNA?  Saralyn Pilar ----- Message ----- From: Royston Bake, RN Sent: 11/28/2022   3:15 PM EST To: Carol Ada, MD; Truitt Merle, MD Subject: pathology results                              Dr  Benson Norway,  Good afternoon.  You preformed an EUS while Ms Carre was an inpatient.  The cytology was non-diagnostic.  Would you be able to perform another EUS with biopsy?  She has a consultation with Dr Burr Medico and Cira Rue this Monday 12/11.   Thank you  KB

## 2022-11-28 NOTE — Patient Outreach (Signed)
  Care Coordination TOC Note Transition Care Management Unsuccessful Follow-up Telephone Call  Date of discharge and from where:  11/26/22-[place]  Attempts:  1st Attempt  Reason for unsuccessful TCM follow-up call:  Left voice message    [facility]et[facility] Blend [place] Management Telephonic Care Management Coordinator Direct Phone: [phone] Toll Free: [phone] Fax: [phone]

## 2022-11-28 NOTE — Telephone Encounter (Signed)
Phone call to patient regarding results of  endoscopic ultrasound with needle biopsy. Biopsy had nondiagnostic material. She says she feels fine and went out to breakfast today. Was found to have PE and is on anticoagulation. Scheduled to be seen at cancer center on December 11th.   MJB,MD

## 2022-11-29 ENCOUNTER — Telehealth: Payer: Self-pay

## 2022-11-29 ENCOUNTER — Encounter (HOSPITAL_COMMUNITY): Payer: Self-pay | Admitting: Gastroenterology

## 2022-11-29 ENCOUNTER — Other Ambulatory Visit: Payer: Self-pay

## 2022-11-29 ENCOUNTER — Other Ambulatory Visit (INDEPENDENT_AMBULATORY_CARE_PROVIDER_SITE_OTHER): Payer: Medicare HMO

## 2022-11-29 DIAGNOSIS — E119 Type 2 diabetes mellitus without complications: Secondary | ICD-10-CM

## 2022-11-29 DIAGNOSIS — E786 Lipoprotein deficiency: Secondary | ICD-10-CM | POA: Diagnosis not present

## 2022-11-29 DIAGNOSIS — Z0000 Encounter for general adult medical examination without abnormal findings: Secondary | ICD-10-CM

## 2022-11-29 DIAGNOSIS — K869 Disease of pancreas, unspecified: Secondary | ICD-10-CM

## 2022-11-29 LAB — POCT URINALYSIS DIPSTICK
Bilirubin, UA: NEGATIVE
Blood, UA: NEGATIVE
Glucose, UA: NEGATIVE
Ketones, UA: NEGATIVE
Leukocytes, UA: NEGATIVE
Nitrite, UA: NEGATIVE
Protein, UA: POSITIVE — AB
Spec Grav, UA: 1.02 (ref 1.010–1.025)
Urobilinogen, UA: 0.2 E.U./dL
pH, UA: 5 (ref 5.0–8.0)

## 2022-11-29 NOTE — Addendum Note (Signed)
Addended by: Geradine Girt D on: 11/29/2022 09:58 AM   Modules accepted: Orders

## 2022-11-29 NOTE — Telephone Encounter (Signed)
EUS has been scheduled for 12/09/22 at 10 am at [facility] with GM   I spoke with the pt and she tells me that she is not interested in setting up the EUS at this time. She is going for a second opinion.  She tells me that she will call our office if/when she would like to make an appt.   FYI all

## 2022-11-29 NOTE — Patient Outreach (Signed)
  Care Coordination TOC Note Transition Care Management Unsuccessful Follow-up Telephone Call  Date of discharge and from where:  11/26/22-[place]  Attempts:  2nd Attempt  Reason for unsuccessful TCM follow-up call:  Unable to reach patient    Hetty Blend [place] Management Telephonic Care Management Coordinator Direct Phone: [phone] Toll Free: [phone] Fax: [phone]

## 2022-11-29 NOTE — Patient Outreach (Signed)
  Care Coordination [facility] Note Transition Care Management Unsuccessful Follow-up Telephone Call  Date of discharge and from where:  11/26/22  Attempts:  3rd Attempt  Reason for unsuccessful TCM follow-up call:  Unable to reach patient     Enzo Montgomery, RN,BSN,CCM [place] Management Telephonic Care Management Coordinator Direct Phone: [phone] Toll Free: [phone] Fax: [phone]

## 2022-11-30 LAB — MICROALBUMIN / CREATININE URINE RATIO
Creatinine, Urine: 354 mg/dL — ABNORMAL HIGH (ref 20–275)
Microalb Creat Ratio: 67 mcg/mg creat — ABNORMAL HIGH (ref <30)
Microalb, Ur: 23.6 mg/dL

## 2022-11-30 LAB — LIPID PANEL
Cholesterol: 156 mg/dL (ref <200)
HDL: 44 mg/dL — ABNORMAL LOW (ref >=50)
LDL Cholesterol (Calc): 91 mg/dL (calc)
Non-HDL Cholesterol (Calc): 112 mg/dL (calc) (ref <130)
Total CHOL/HDL Ratio: 3.5 (calc) (ref <5.0)
Triglycerides: 113 mg/dL (ref <150)

## 2022-12-01 NOTE — Progress Notes (Deleted)
[place]   [phone] [phone]   Clinic New Consult Note   Patient Care Team: Baxley, Cresenciano Lick, MD as PCP - General (Internal Medicine) Inda Castle, MD (Inactive) as Consulting Physician (Gastroenterology) 12/01/2022  CHIEF COMPLAINTS/PURPOSE OF CONSULTATION:  Pancreatic mass, referred by hospitalist   HISTORY OF PRESENTING ILLNESS:  Victoria [name] August 81 y.o. female with PMH including HTN, DM and breast cancer is here because of pancreatic mass. She was recently seen by PCP 11/19/22 for URI and back pain, given antibiotics, inhaler, and norco for pain. She had chest pain later that night and went to ED 11/21/22. CTA showed mild to moderate PE in the right pulmonary artery with right heart strain as well as a 4 x 4.7 cm soft tissue mass in the pancreatic head. She was admitted for further work up, MR Abd w/ MRCP 11/21/22 showed cirrhotic liver, diffuse dilatation throughout the body and pail of the pancreas concerning for obstructing mass but not definitely seen, and multiple prominent, borderline but not pathologically enlarged RP and upper abdominal lymph nodes. She underwent EUS 11/25/22 bt Dr. Benson Norway, which showed a 22 mm hypoechoic, irregular mass in the pancreatic head. FNA was done, but did not contain diagnostic material, mainly blood and fragments of small bowel. CA 19-9 is normal, 5. She was discharged on eliquis 12/5. A repeat EUS was scheduled for 12/09/22 but pt canceled.   MEDICAL HISTORY:  Past Medical History:  Diagnosis Date   Abnormal finding on Pap smear    Allergy    SEASONAL   Anxiety    Breast cancer ([place]) 2000   right breast lumptectomy, radiation done   Cirrhosis ([place]) 11/2012   resolved per pt   Diabetes mellitus type 2, controlled ([place])    Hepatitis C    took tx for    Hypertension    Obesity    Thrombocytopenia ([place]) 11-2012    SURGICAL HISTORY: Past Surgical History:  Procedure Laterality Date   ANTERIOR AND  POSTERIOR REPAIR N/A 02/28/2021   Procedure: ANTERIOR (CYSTOCELE) AND POSTERIOR REPAIR (RECTOCELE);  Surgeon: Cheri Fowler, MD;  Location: [facility];  Service: Gynecology;  Laterality: N/A;   BREAST BIOPSY Left 09/09/2016   BREAST LUMPECTOMY  right   BREAST LUMPECTOMY     BREAST REDUCTION SURGERY  2005   CATARACT EXTRACTION, BILATERAL  2014   CHOLECYSTECTOMY N/A 03/30/2013   Procedure: LAPAROSCOPIC CHOLECYSTECTOMY WITH INTRAOPERATIVE CHOLANGIOGRAM;  Surgeon: Shann Medal, MD;  Location: [facility];  Service: General;  Laterality: N/A;   COLONOSCOPY  2021   CYSTOSCOPY N/A 02/28/2021   Procedure: Consuela Mimes;  Surgeon: Cheri Fowler, MD;  Location: [place];  Service: Gynecology;  Laterality: N/A;   CYSTOSCOPY W/ RETROGRADES Left 02/28/2021   Procedure: CYSTOSCOPY WITH RETROGRADE PYELOGRAM;  Surgeon: Cheri Fowler, MD;  Location: [place];  Service: Gynecology;  Laterality: Left;   ESOPHAGOGASTRODUODENOSCOPY (EGD) WITH PROPOFOL N/A 11/25/2022   Procedure: ESOPHAGOGASTRODUODENOSCOPY (EGD) WITH PROPOFOL;  Surgeon: Carol Ada, MD;  Location: [place];  Service: Gastroenterology;  Laterality: N/A;   EUS  01/28/2013   Procedure: UPPER ENDOSCOPIC ULTRASOUND (EUS) LINEAR;  Surgeon: Milus Banister, MD;  [facility];  Service: Endoscopy;  Laterality: N/A;  renne cornick [phone]   EUS Left 11/25/2022   Procedure: UPPER ENDOSCOPIC ULTRASOUND (EUS) LINEAR;  Surgeon: Carol Ada, MD;  Location: [place];  Service: Gastroenterology;  Laterality: Left;   EXCISION OF SKIN TAG Right 03/30/2013  Procedure: EXCISION OF SKIN TAG;  Surgeon: Shann Medal, MD;  Location: [facility];  Service: General;  Laterality: Right;   FINE NEEDLE ASPIRATION  11/25/2022   Procedure: FINE NEEDLE ASPIRATION (FNA) LINEAR;  Surgeon: Carol Ada, MD;  Location: [place];  Service: Gastroenterology;;   LIVER BIOPSY  03/30/2013   Procedure:  LIVER BIOPSY;  Surgeon: Shann Medal, MD;  Location: [facility];  Service: General;;   POLYPECTOMY     REDUCTION MAMMAPLASTY     THYROIDECTOMY, PARTIAL  1972   left   VAGINAL HYSTERECTOMY N/A 02/28/2021   Procedure: HYSTERECTOMY VAGINAL WITH UNILATERAL SALPINGECTOMY;  Surgeon: Cheri Fowler, MD;  Location: [place];  Service: Gynecology;  Laterality: N/A;    SOCIAL HISTORY: Social History   Socioeconomic History   Marital status: Divorced    Spouse name: Not on file   Number of children: 3   Years of education: Not on file   Highest education level: Not on file  Occupational History   Not on file  Tobacco Use   Smoking status: Never   Smokeless tobacco: Never  Vaping Use   Vaping Use: Never used  Substance and Sexual Activity   Alcohol use: No   Drug use: No   Sexual activity: Not Currently  Other Topics Concern   Not on file  Social History Narrative   Lives alone   Caffeine use: none   Right handed    Social Determinants of Health   Financial Resource Strain: Medium Risk (12/03/2021)   Overall Financial Resource Strain (CARDIA)    Difficulty of Paying Living Expenses: Somewhat hard  Food Insecurity: No Food Insecurity (11/22/2022)   Hunger Vital Sign    Worried About Running Out of Food in the Last Year: Never true    Ran Out of Food in the Last Year: Never true  Transportation Needs: No Transportation Needs (11/22/2022)   PRAPARE - Hydrologist (Medical): No    Lack of Transportation (Non-Medical): No  Physical Activity: Sufficiently Active (12/03/2021)   Exercise Vital Sign    Days of Exercise per Week: 4 days    Minutes of Exercise per Session: 60 min  Stress: No Stress Concern Present (12/03/2021)   [place]    Feeling of Stress : Only a little  Social Connections: Moderately Isolated (12/03/2021)   Social Connection and Isolation Panel  [NHANES]    Frequency of Communication with Friends and Family: More than three times a week    Frequency of Social Gatherings with Friends and Family: More than three times a week    Attends Religious Services: Never    Marine scientist or Organizations: Yes    Attends Archivist Meetings: Never    Marital Status: Divorced  Human resources officer Violence: Not At Risk (11/22/2022)   Humiliation, Afraid, Rape, and Kick questionnaire    Fear of Current or Ex-Partner: No    Emotionally Abused: No    Physically Abused: No    Sexually Abused: No    FAMILY HISTORY: Family History  Problem Relation Age of Onset   Diabetes Mother    Stroke Mother    Colon cancer Sister 73   Colon polyps Neg Hx    Esophageal cancer Neg Hx    Rectal cancer Neg Hx    Stomach cancer Neg Hx    Pancreatic cancer Neg Hx    Prostate cancer Neg Hx  Breast cancer Neg Hx     ALLERGIES:  is allergic to lisinopril.  MEDICATIONS:  Current Outpatient Medications  Medication Sig Dispense Refill   albuterol (VENTOLIN HFA) 108 (90 Base) MCG/ACT inhaler Inhale 2 puffs into the lungs every 6 (six) hours as needed for wheezing or shortness of breath. 8 g PRN   ALPRAZolam (XANAX) 0.5 MG tablet TAKE 1 TABLET(0.5 MG) BY MOUTH TWICE DAILY AS NEEDED FOR ANXIETY (Patient taking differently: Take 0.5 mg by mouth 2 (two) times daily.) 60 tablet 5   amLODipine (NORVASC) 2.5 MG tablet TAKE 1 TABLET(2.5 MG) BY MOUTH AT BEDTIME (Patient taking differently: Take 2.5 mg by mouth every evening.) 90 tablet 1   [START ON 12/27/2022] apixaban (ELIQUIS) 5 MG TABS tablet Take 1 tablet (5 mg total) by mouth 2 (two) times daily. 60 tablet 2   APIXABAN (ELIQUIS) VTE STARTER PACK (10MG AND 5MG) Take as directed on package: start with two-61m tablets twice daily for 7 days. On day 8, switch to one-567mtablet twice daily. 74 each 0   Blood Glucose Monitoring Suppl (TRUE METRIX METER) w/Device KIT USE AS DIRECTED 1 kit prn   Cetirizine  HCl (ZYRTEC ALLERGY PO) Take 1 tablet by mouth daily as needed (allergies).      cholecalciferol (VITAMIN D3) 25 MCG (1000 UNIT) tablet Take 1,000 Units by mouth daily.     glucose blood (TRUE METRIX BLOOD GLUCOSE TEST) test strip TEST BLOOD SUGAR TWICE DAILY 200 strip prn   Lancets (FREESTYLE) lancets CHECK GLUCOSE TWICE A DAY 100 each 12   losartan-hydrochlorothiazide (HYZAAR) 100-25 MG tablet Take 1 tablet by mouth daily. 90 tablet 3   metFORMIN (GLUCOPHAGE) 500 MG tablet TAKE 1 TABLET(500 MG) BY MOUTH TWICE DAILY WITH A MEAL (Patient taking differently: Take 500 mg by mouth 2 (two) times daily with a meal.) 180 tablet 2   No current facility-administered medications for this visit.    REVIEW OF SYSTEMS:   Constitutional: Denies fevers, chills or abnormal night sweats Eyes: Denies blurriness of vision, double vision or watery eyes Ears, nose, mouth, throat, and face: Denies mucositis or sore throat Respiratory: Denies cough, dyspnea or wheezes Cardiovascular: Denies palpitation, chest discomfort or lower extremity swelling Gastrointestinal:  Denies nausea, heartburn or change in bowel habits Skin: Denies abnormal skin rashes Lymphatics: Denies new lymphadenopathy or easy bruising Neurological:Denies numbness, tingling or new weaknesses Behavioral/Psych: Mood is stable, no new changes  All other systems were reviewed with the patient and are negative.  PHYSICAL EXAMINATION: ECOG PERFORMANCE STATUS: {CHL ONC ECOG PS:[phone]}  There were no vitals filed for this visit. There were no vitals filed for this visit.  GENERAL:alert, no distress and comfortable SKIN: skin color, texture, turgor are normal, no rashes or significant lesions EYES: normal, conjunctiva are pink and non-injected, sclera clear OROPHARYNX:no exudate, no erythema and lips, buccal mucosa, and tongue normal  NECK: supple, thyroid normal size, non-tender, without nodularity LYMPH:  no palpable lymphadenopathy in  the cervical, axillary or inguinal LUNGS: clear to auscultation and percussion with normal breathing effort HEART: regular rate & rhythm and no murmurs and no lower extremity edema ABDOMEN:abdomen soft, non-tender and normal bowel sounds Musculoskeletal:no cyanosis of digits and no clubbing  PSYCH: alert & oriented x 3 with fluent speech NEURO: no focal motor/sensory deficits  LABORATORY DATA:  I have reviewed the data as listed    Latest Ref Rng & Units 11/26/2022   12:51 AM 11/25/2022    1:02 AM 11/24/2022    6:02  AM  CBC  WBC 4.0 - 10.5 K/uL 8.2  8.8  8.1   Hemoglobin 12.0 - 15.0 g/dL 10.2  10.9  10.6   Hematocrit 36.0 - 46.0 % 30.9  32.6  32.0   Platelets 150 - 400 K/uL 244  254  240     _0 @  RADIOGRAPHIC STUDIES: I have personally reviewed the radiological images as listed and agreed with the findings in the report. VAS [place] LOWER EXTREMITY VENOUS (DVT)  Result Date: 11/23/2022  Lower Venous DVT Study Patient Name:  Victoria [name]  Date of Exam:   11/22/2022 Medical Rec #: [id number]          Accession #:    [id number] Date of Birth: [birth year]          Patient Gender: F Patient Age:   62 years Exam Location:  [facility] Procedure:      VAS [place] LOWER EXTREMITY VENOUS (DVT) Referring Phys: Bonnielee Haff --------------------------------------------------------------------------------  Indications: Pulmonary embolism.  Comparison Study: no prior Performing Technologist: Archie Patten RVS  Examination Guidelines: A complete evaluation includes B-mode imaging, spectral Doppler, color Doppler, and power Doppler as needed of all accessible portions of each vessel. Bilateral testing is considered an integral part of a complete examination. Limited examinations for reoccurring indications may be performed as noted. The reflux portion of the exam is performed with the patient in reverse Trendelenburg.  +---------+---------------+---------+-----------+----------+--------------+ RIGHT     CompressibilityPhasicitySpontaneityPropertiesThrombus Aging +---------+---------------+---------+-----------+----------+--------------+ CFV      Full           Yes      Yes                                 +---------+---------------+---------+-----------+----------+--------------+ SFJ      Full                                                        +---------+---------------+---------+-----------+----------+--------------+ FV Prox  Full                                                        +---------+---------------+---------+-----------+----------+--------------+ FV Mid   Full                                                        +---------+---------------+---------+-----------+----------+--------------+ FV DistalFull                                                        +---------+---------------+---------+-----------+----------+--------------+ PFV      Full                                                        +---------+---------------+---------+-----------+----------+--------------+  POP      Full           Yes      Yes                                 +---------+---------------+---------+-----------+----------+--------------+ PTV      Full                                                        +---------+---------------+---------+-----------+----------+--------------+ PERO     Full                                                        +---------+---------------+---------+-----------+----------+--------------+   +---------+---------------+---------+-----------+----------+--------------+ LEFT     CompressibilityPhasicitySpontaneityPropertiesThrombus Aging +---------+---------------+---------+-----------+----------+--------------+ CFV      Full           Yes      Yes                                 +---------+---------------+---------+-----------+----------+--------------+ SFJ      Full                                                         +---------+---------------+---------+-----------+----------+--------------+ FV Prox  Full                                                        +---------+---------------+---------+-----------+----------+--------------+ FV Mid   Full                                                        +---------+---------------+---------+-----------+----------+--------------+ FV DistalFull                                                        +---------+---------------+---------+-----------+----------+--------------+ PFV      Full                                                        +---------+---------------+---------+-----------+----------+--------------+ POP      Full           Yes      Yes                                 +---------+---------------+---------+-----------+----------+--------------+  PTV      Full                                                        +---------+---------------+---------+-----------+----------+--------------+ PERO     Full           Yes      Yes                                 +---------+---------------+---------+-----------+----------+--------------+     Summary: BILATERAL: - No evidence of deep vein thrombosis seen in the lower extremities, bilaterally. -No evidence of popliteal cyst, bilaterally.   *See table(s) above for measurements and observations. Electronically signed by Orlie Pollen on 11/23/2022 at 1:25:52 PM.    Final    MR ABDOMEN MRCP W WO CONTAST  Result Date: 11/23/2022 CLINICAL DATA:  81 year old female with abnormal appearance of the pancreas on prior chest CTA examination. Follow-up study. EXAM: MRI ABDOMEN WITHOUT AND WITH CONTRAST (INCLUDING MRCP) TECHNIQUE: Multiplanar multisequence MR imaging of the abdomen was performed both before and after the administration of intravenous contrast. Heavily T2-weighted images of the biliary and pancreatic ducts were obtained, and three-dimensional MRCP images  were rendered by post processing. CONTRAST:  70m GADAVIST GADOBUTROL 1 MMOL/ML IV SOLN COMPARISON:  Abdominal MRI 11/14/2014.  Chest CTA 11/21/2022. FINDINGS: Comment: Portions of today's examination are limited by considerable patient motion. Lower chest: Unremarkable. Hepatobiliary: Mild diffuse loss of signal intensity throughout the hepatic parenchyma on out of phase dual echo images, indicative of a background of mild hepatic steatosis. Liver has a shrunken appearance and nodular contour, indicative of underlying cirrhosis. During arterial phase imaging there are several ill-defined peripheral predominant areas of mild hyperenhancement scattered throughout all aspects of the liver, which are not associated with signal abnormalities on precontrast imaging, not associated with diffusion restriction, and completely normalize in appearance on later post gadolinium imaging, most compatible with benign perfusion anomalies (transient hepatic intensity differences (THIDs)). No other suspicious appearing hepatic lesions are noted. Status post cholecystectomy. No intrahepatic biliary ductal dilatation. Common bile duct measures 9 mm within the porta hepatis, within normal limits given the patient's advanced age and post cholecystectomy status. No filling defect in the common bile duct to suggest choledocholithiasis. Pancreas: Increased number and size of what are now innumerable small T1 hypointense and T2 hyperintense lesions scattered throughout the pancreas, largest of which is in the anterior aspect of the neck of the pancreas (axial image 28 of series 4) measuring 1.7 x 1.3 cm. MRCP images demonstrate severe dilatation of the main pancreatic duct, which currently measures up to 1.4 cm in diameter in the body of the pancreas. There is abrupt cut off of the duct in the region of the pancreatic neck best appreciated on axial image 26 of series 4 and coronal image 25 of series 5. No discrete pancreatic lesion  confidently identified in this region (portions of today's examination are substantially limited by patient motion), however, there does appear to be a small focus of diffusion restriction in this region (axial image 93 of series 9). No peripancreatic fluid collections or inflammatory changes. Spleen:  Unremarkable. Adrenals/Urinary Tract: T1 hypointense, T2 hyperintense, nonenhancing lesions in both kidneys are compatible with simple (Bosniak class 1) cysts (no imaging follow-up recommended),  measuring up to 2.1 cm in the lower pole of the left kidney. No aggressive appearing renal lesions are noted. No hydroureteronephrosis in the visualized portions of the abdomen. Bilateral adrenal glands are normal in appearance. Stomach/Bowel: Innumerable colonic diverticuli are noted in the visualized portions of the colon. Vascular/Lymphatic: Aortic atherosclerosis, without definite aneurysm in the abdominal vasculature. Multiple borderline enlarged and enlarged lymph nodes are noted in the upper abdomen and retroperitoneum, many of which demonstrate restricted diffusion. The largest of these are in the region of the hepatoduodenal ligament and portacaval nodal stations measuring up to 1.9 cm in short axis (axial image 35 of series 22). The largest retroperitoneal lymph node is in the left para-aortic nodal station (axial image 66 of series 22) measuring 1 cm in short axis. Other: No significant volume of ascites noted in the visualized portions of the peritoneal cavity. Musculoskeletal: No aggressive appearing osseous lesions are noted in the visualized portions of the skeleton. IMPRESSION: 1. Marked change in the appearance of the pancreas, now characterized by diffuse ductal dilatation throughout the body and tail of the pancreas with abrupt cut off of the duct in the region of the pancreatic neck where there is a small focus of diffusion restriction. Although no discrete obstructing pancreatic mass is confidently  identified on today's motion limited examination, an obstructing lesion is strongly suspected. Further evaluation with endoscopic ultrasound and potential tissue sampling is strongly recommended in the near future to better evaluate these findings. 2. Multiple prominent borderline enlarged and enlarged retroperitoneal and upper abdominal lymph nodes, most evident in the hepato duodenal ligament, many of which demonstrate diffusion restriction, concerning for metastatic lymphadenopathy. 3. Morphologic changes in the liver indicative of underlying cirrhosis. No aggressive appearing hepatic lesions are noted on today's examination. 4. Severe colonic diverticulosis. 5. Additional incidental findings, as above. Electronically Signed   By: Vinnie Langton M.D.   On: 11/23/2022 08:21   ECHOCARDIOGRAM COMPLETE  Result Date: 11/22/2022    ECHOCARDIOGRAM REPORT   Patient Name:   Victoria [name] Date of Exam: 11/22/2022 Medical Rec #:  [id number]         Height:       67.0 in Accession #:    [id number]        Weight:       192.1 lb Date of Birth:  1941-02-08         BSA:          1.988 m Patient Age:    74 years          BP:           192/99 mmHg Patient Gender: F                 HR:           66 bpm. Exam Location:  Inpatient Procedure: 2D Echo, Cardiac Doppler and Color Doppler Indications:    Pulmonary Embolus I26.09  History:        Patient has no prior history of Echocardiogram examinations.                 P.E.; Risk Factors:Hypertension, Diabetes and Non-Smoker.  Sonographer:    Greer Pickerel Referring Phys: [id number] Bonnielee Haff  Sonographer Comments: Image acquisition challenging due to respiratory motion. IMPRESSIONS  1. Left ventricular ejection fraction, by estimation, is 70 to 75%. The left ventricle has hyperdynamic function. The left ventricle has no regional wall motion abnormalities. Left ventricular diastolic parameters are consistent with Grade I  diastolic dysfunction (impaired relaxation).  2. Right  ventricular systolic function is normal. The right ventricular size is normal. Tricuspid regurgitation signal is inadequate for assessing PA pressure.  3. The mitral valve is normal in structure. No evidence of mitral valve regurgitation. No evidence of mitral stenosis.  4. The aortic valve is tricuspid. Aortic valve regurgitation is not visualized. No aortic stenosis is present.  5. The inferior vena cava is normal in size with greater than 50% respiratory variability, suggesting right atrial pressure of 3 mmHg. Comparison(s): No prior Echocardiogram. FINDINGS  Left Ventricle: Left ventricular ejection fraction, by estimation, is 70 to 75%. The left ventricle has hyperdynamic function. The left ventricle has no regional wall motion abnormalities. The left ventricular internal cavity size was normal in size. There is no left ventricular hypertrophy. Left ventricular diastolic parameters are consistent with Grade I diastolic dysfunction (impaired relaxation). Right Ventricle: The right ventricular size is normal. Right ventricular systolic function is normal. Tricuspid regurgitation signal is inadequate for assessing PA pressure. The tricuspid regurgitant velocity is 1.12 m/s, and with an assumed right atrial  pressure of 3 mmHg, the estimated right ventricular systolic pressure is 8.0 mmHg. Left Atrium: Left atrial size was normal in size. Right Atrium: Right atrial size was normal in size. Pericardium: There is no evidence of pericardial effusion. Mitral Valve: The mitral valve is normal in structure. Mild mitral annular calcification. No evidence of mitral valve regurgitation. No evidence of mitral valve stenosis. Tricuspid Valve: The tricuspid valve is normal in structure. Tricuspid valve regurgitation is trivial. No evidence of tricuspid stenosis. Aortic Valve: The aortic valve is tricuspid. Aortic valve regurgitation is not visualized. No aortic stenosis is present. Pulmonic Valve: The pulmonic valve was normal  in structure. Pulmonic valve regurgitation is not visualized. No evidence of pulmonic stenosis. Aorta: The aortic root is normal in size and structure. Venous: The inferior vena cava is normal in size with greater than 50% respiratory variability, suggesting right atrial pressure of 3 mmHg. IAS/Shunts: No atrial level shunt detected by color flow Doppler.  LEFT VENTRICLE PLAX 2D LVIDd:         3.90 cm   Diastology LVIDs:         2.20 cm   LV e' medial:    4.97 cm/s LV PW:         1.00 cm   LV E/e' medial:  13.0 LV IVS:        1.10 cm   LV e' lateral:   9.01 cm/s LVOT diam:     2.10 cm   LV E/e' lateral: 7.2 LV SV:         76 LV SV Index:   38 LVOT Area:     3.46 cm  RIGHT VENTRICLE RV S prime:     11.30 cm/s TAPSE (M-mode): 1.8 cm LEFT ATRIUM           Index        RIGHT ATRIUM           Index LA diam:      2.40 cm 1.21 cm/m   RA Area:     10.70 cm LA Vol (A2C): 74.5 ml 37.48 ml/m  RA Volume:   19.50 ml  9.81 ml/m LA Vol (A4C): 52.2 ml 26.26 ml/m  AORTIC VALVE LVOT Vmax:   97.70 cm/s LVOT Vmean:  67.300 cm/s LVOT VTI:    0.219 m  AORTA Ao Root diam: 3.40 cm Ao Asc diam:  3.20 cm MITRAL VALVE  TRICUSPID VALVE MV Area (PHT): 2.80 cm    TR Peak grad:   5.0 mmHg MV Decel Time: 271 msec    TR Vmax:        112.00 cm/s MV E velocity: 64.70 cm/s MV A velocity: 88.70 cm/s  SHUNTS MV E/A ratio:  0.73        Systemic VTI:  0.22 m                            Systemic Diam: 2.10 cm Kirk Ruths MD Electronically signed by Kirk Ruths MD Signature Date/Time: 11/22/2022/2:43:53 PM    Final    CT Angio Chest PE W and/or Wo Contrast  Result Date: 11/21/2022 CLINICAL DATA:  Pulmonary embolism. EXAM: CT ANGIOGRAPHY CHEST WITH CONTRAST TECHNIQUE: Multidetector CT imaging of the chest was performed using the standard protocol during bolus administration of intravenous contrast. Multiplanar CT image reconstructions and MIPs were obtained to evaluate the vascular anatomy. RADIATION DOSE REDUCTION: This exam was  performed according to the departmental dose-optimization program which includes automated exposure control, adjustment of the mA and/or kV according to patient size and/or use of iterative reconstruction technique. CONTRAST:  83m OMNIPAQUE IOHEXOL 350 MG/ML SOLN COMPARISON:  None Available. FINDINGS: Cardiovascular: There is moderate severity calcification of the aortic arch, without evidence of aortic aneurysm. Satisfactory opacification of the pulmonary arteries to the segmental level. A mild-to-moderate amount of pulmonary embolism is seen within the distal aspect of the right pulmonary artery. Extension into the proximal portions of the lower lobe branches of the right pulmonary artery is noted. Normal heart size with right heart strain (RV/LV ratio of 1.28). No pericardial effusion. Mediastinum/Nodes: No enlarged mediastinal, hilar, or axillary lymph nodes. The left lobe of the thyroid gland is enlarged and heterogeneous in appearance. The trachea and esophagus demonstrate no significant findings. Lungs/Pleura: Mild, slightly nodular appearing areas of focal scarring and/or atelectasis are seen along the periphery of both lungs. This is slightly more prominent within the bilateral lower lobes. There is no evidence of a pleural effusion or pneumothorax. Upper Abdomen: Multiple surgical clips are seen within the gallbladder fossa. A 4.0 cm x 4.7 cm heterogeneous soft tissue mass is suspected within the region of the head of the pancreas. This area is limited in evaluation in the absence of intravenous contrast. Noninflamed diverticula are seen throughout the large bowel. Musculoskeletal: Diffusely sclerotic vertebral bodies are seen with multilevel degenerative changes noted throughout the thoracic spine. Review of the MIP images confirms the above findings. IMPRESSION: 1. Mild-to-moderate amount of pulmonary embolism within the distal aspect of the right pulmonary artery. 2. Right heart strain with an RV/LV  ratio of 1.28. 3. Mild, slightly nodular appearing areas of peripheral, bilateral lobe scarring and/or atelectasis. 4. Colonic diverticulosis. 5. Findings may represent a soft tissue mass within the region of the head of the pancreas. Further evaluation with MRI is recommended, as an underlying neoplastic process cannot be excluded. 6. Aortic atherosclerosis. Aortic Atherosclerosis (ICD10-I70.0). Electronically Signed   By: TVirgina NorfolkM.D.   On: 11/21/2022 19:40   DG Chest 2 View  Result Date: 11/21/2022 CLINICAL DATA:  Chest pain. EXAM: CHEST - 2 VIEW COMPARISON:  November 12, 2021. FINDINGS: The heart size and mediastinal contours are within normal limits. Both lungs are clear. The visualized skeletal structures are unremarkable. IMPRESSION: No active cardiopulmonary disease. Electronically Signed   By: JMarijo ConceptionM.D.   On: 11/21/2022 10:40  ASSESSMENT & PLAN:  *** No orders of the defined types were placed in this encounter.   All questions were answered. The patient knows to call the clinic with any problems, questions or concerns. I spent {CHL ONC TIME VISIT - CVUDT:[id number]} counseling the patient face to face. The total time spent in the appointment was {CHL ONC TIME VISIT - JKQAS:[id number]} and more than 50% was on counseling.     Alla Feeling, NP 12/01/2022 1:10 PM

## 2022-12-01 NOTE — Telephone Encounter (Signed)
Patty, Thank you for update and trying.  YF, Let us know what you hear and we will see what may still be available or not after you see her in clinic. Thanks. GM

## 2022-12-02 ENCOUNTER — Inpatient Hospital Stay: Payer: Medicare HMO

## 2022-12-02 ENCOUNTER — Inpatient Hospital Stay: Payer: Medicare HMO | Attending: Nurse Practitioner | Admitting: Nurse Practitioner

## 2022-12-04 ENCOUNTER — Telehealth: Payer: Self-pay | Admitting: Gastroenterology

## 2022-12-04 ENCOUNTER — Ambulatory Visit (INDEPENDENT_AMBULATORY_CARE_PROVIDER_SITE_OTHER): Payer: Medicare HMO

## 2022-12-04 ENCOUNTER — Encounter: Payer: Self-pay | Admitting: Internal Medicine

## 2022-12-04 ENCOUNTER — Ambulatory Visit (INDEPENDENT_AMBULATORY_CARE_PROVIDER_SITE_OTHER): Payer: Medicare HMO | Admitting: Internal Medicine

## 2022-12-04 VITALS — BP 112/74 | HR 96 | Temp 98.9°F | Ht 67.0 in | Wt 188.0 lb

## 2022-12-04 DIAGNOSIS — Z8619 Personal history of other infectious and parasitic diseases: Secondary | ICD-10-CM

## 2022-12-04 DIAGNOSIS — K8689 Other specified diseases of pancreas: Secondary | ICD-10-CM

## 2022-12-04 DIAGNOSIS — Z87898 Personal history of other specified conditions: Secondary | ICD-10-CM

## 2022-12-04 DIAGNOSIS — F411 Generalized anxiety disorder: Secondary | ICD-10-CM

## 2022-12-04 DIAGNOSIS — E1122 Type 2 diabetes mellitus with diabetic chronic kidney disease: Secondary | ICD-10-CM | POA: Diagnosis not present

## 2022-12-04 DIAGNOSIS — E119 Type 2 diabetes mellitus without complications: Secondary | ICD-10-CM

## 2022-12-04 DIAGNOSIS — Z853 Personal history of malignant neoplasm of breast: Secondary | ICD-10-CM

## 2022-12-04 DIAGNOSIS — R0781 Pleurodynia: Secondary | ICD-10-CM

## 2022-12-04 DIAGNOSIS — I2699 Other pulmonary embolism without acute cor pulmonale: Secondary | ICD-10-CM

## 2022-12-04 DIAGNOSIS — I10 Essential (primary) hypertension: Secondary | ICD-10-CM | POA: Diagnosis not present

## 2022-12-04 MED ORDER — HYDROCODONE-ACETAMINOPHEN 10-325 MG PO TABS: 1.0000 | ORAL_TABLET | Freq: Three times a day (TID) | ORAL | 0 refills | Status: AC | PRN

## 2022-12-04 MED ORDER — HYDROCODONE-ACETAMINOPHEN 10-325 MG PO TABS: 1.0000 | ORAL_TABLET | Freq: Three times a day (TID) | ORAL | 0 refills | Status: DC | PRN

## 2022-12-04 NOTE — Progress Notes (Signed)
Annual Wellness Visit     Patient: Victoria [name], Female    DOB: 1941-07-21, 81 y.o.   MRN: [id number] Visit Date: 12/04/2022  Recently hospitalized. See separate note. Subjective    Victoria [name] is a 81 y.o. female who presents today for her Annual Wellness Visit.  HPI Here for annual Medicare wellness visit.  Recently hospitalized on November 30 and found to have mild to moderate amount of pulmonary embolism in the distal aspect of the right pulmonary artery.  She is now on Eliquis.  While being evaluated for this issue she was found to have possible soft tissue mass within the region of the head of the pancreas and she was sent for MRI of the abdomen which was abnormal.  Dr. Benson Norway was consulted by Hospitalist.  On December 4 I have reviewed a cytology report from fine-needle aspiration pancreatic head mass which is apparently nondiagnostic.  Also while hospitalized found to have mild to moderate amount of pulmonary embolism within the distal aspect of the right pulmonary artery.  Also had right heart strain.  She has a history of type 2 diabetes mellitus, essential hypertension, history of recurrent vertigo, history of hepatitis C treated with Harvoni and cured.  Remote history of breast cancer.  History of partial left thyroidectomy at [facility] in [facility].  History of anxiety treated with Xanax.  Diabetes has been treated with metformin.  Hypertension has been treated with amlodipine and losartan/HCTZ.   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 Anxiety over what could be wrong    Objective     Vitals: BP 112/74   Pulse 96   Temp 98.9 F (37.2 C) (Tympanic)   Ht '5\' 7"'$  (1.702 m)   Wt 188 lb (85.3 kg)   SpO2 98%   BMI 29.44 kg/m     Most recent functional status assessment:    12/04/2022   10:11 AM  In your present state of health, do you have any difficulty performing the following activities:  Hearing? 0  Vision? 0  Difficulty concentrating or making decisions? 0  Walking or climbing stairs? 0  Dressing or bathing? 0  Doing errands, shopping? 0  Preparing Food and eating ? N  Using the Toilet? N  In the past six months, have you accidently leaked urine? N  Do you have problems with loss of bowel control? N  Managing your Medications? N  Managing your Finances? N  Housekeeping or managing your Housekeeping? N   Most recent fall risk assessment:    12/04/2022   10:20 AM  Fall Risk   Falls in the past year? 0  Number falls in past yr: 0  Injury with Fall? 0  Risk for fall due to : No Fall Risks  Follow up Falls prevention discussed    Most recent depression screenings:    12/04/2022   10:20 AM 12/04/2022   10:11 AM  PHQ 2/9 Scores  PHQ - 2 Score 0 0   Most recent cognitive screening:    12/04/2022   10:12 AM  6CIT Screen  What  Year? 0 points  What month? 0 points  What time? 0 points  Count back from 20 0 points  Months in reverse 0 points  Repeat phrase 0 points  Total Score 0 points       Assessment & Plan   Has abnormal MR of abdomen with multiple prominent borderline enlarged enlarged retroperitoneal and upper abdominal lymph nodes most evident in the hepatoduodenal ligament raising concern for metastatic lymphadenopathy.  Liver findings consistent of cirrhosis.  Severe colonic diverticulosis.  Ductal dilatation apparent throughout the body and tail of the pancreas with an abrupt cut off of the duct in the region of the pancreatic neck where there is a small focus of diffusion restriction.  No mass is confidently identified but an  obstructive lesion is suspected.  She is being referred to Dr. Rush Landmark.  CA 19-9 is normal  Left rib cage pain.  Have prescribed small quantity of hydrocodone APAP for pain which she says is severe.  History of Hepatitis C treated with Harvoni in the remote past  Patient denies drinking alcohol but has cirrhosis based on recent MR study.  Liver functions in December 2022 were normal  Chronic kidney disease  Type 2 diabetes mellitus  Essential hypertension  Anxiety state-worried about results of recent hospital workup.  Agrees to see Dr. Rush Landmark.  Colonoscopy by Dr. Silverio Decamp in 2020 was normal except for hemorrhoids and diverticulosis  Breast biopsy in 2017 was benign.  History of ventral hernia seen by Dr. Konrad Penta 2022.  Diverticulosis noted on colonoscopy in 2020  History of cirrhosis-patient denies alcohol consumption.  This could be due to hepatitis C.  She took Harvoni in the past and was declared cured of hepatitis C.  History of hysterectomy  Status postcholecystectomy  Remote history of breast cancer  Plan: Urgent GI consultation    Annual wellness visit done today including the all of the following: Reviewed patient's Family Medical History Reviewed and updated list of patient's medical providers Assessment of cognitive impairment was done Assessed patient's functional ability Established a written schedule for health screening [place] Completed and Reviewed  Discussed health benefits of physical activity, and encouraged her to engage in regular exercise appropriate for her age and condition.         {I, Elby Showers, MD, have reviewed all documentation for this visit. The documentation on 12/04/22 for the exam, diagnosis, procedures, and orders are all accurate and complete.   LaVon Barron Alvine, CMA

## 2022-12-04 NOTE — Telephone Encounter (Signed)
Dr Rush Landmark I spoke with Dr Renold Genta and she tells me that she has spoken with the pt and she is ready to proceed with EUS. Please advise on scheduling.

## 2022-12-04 NOTE — Patient Instructions (Signed)
Plan is to see Dr. Lilia Argue as soon as possible. Spoke with his nurse today. Ordered left rib detail films and small quantity of pain med ordered. Suggest she not return to work pending her evaluation.

## 2022-12-04 NOTE — Patient Instructions (Addendum)
Annual medicare wellness completed.  Urgent GI consultation with Dr. Rush Landmark regarding pancreatic mass.  She is to have left rib detail films because she is complaining of severe lower left rib cage pain.  She has point tenderness on her left rib.  Prescribed Norco 10/325 to take 1/2 to 1 tablet every 8 hours sparingly with food.

## 2022-12-04 NOTE — Telephone Encounter (Signed)
Inbound call from [place] at [facility] office requesting a call back in regards to patient. [phone], says it is urgent.

## 2022-12-04 NOTE — Progress Notes (Signed)
Subjective:    Patient ID: Victoria [name], female    DOB: 03-19-1941, 81 y.o.   MRN: [id number]  HPI  81 year old Female seen for Hospitalization Follow-Up involving new diagnosis of pulmonary embolus, weight loss, abnormal abdominal CT with suspected pancreatic mass.  .Patient has Hx of hypertension and DM.  Patient says accuchecks currently running in the 140s to 150s.  Does get hungry.  Denies severe abdominal pain, nausea or vomiting.  Remote history of Hepatitis C treated with Harvoni.  Patient was seen here in the office November 28 acutely complaining of respiratory infection symptoms.  She felt that there was some swelling around her thyroid bilaterally and felt that she was coming down with a respiratory infection.  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 She had received RSV vaccine on October 15.  Had no recent COVID booster.  She stated she had taken a COVID test at home that was negative.  She was complaining of maxillary sinus tenderness bilaterally and some left-sided back pain.  Had no urinary symptoms. History of hypertension is treated with losartan/HCTZ and amlodipine. History of Type 2 Diabetes mellitus treated with metformin.  At the time, she denied nausea, vomiting fever or shaking chills.  Was noted to have bilateral enlarged lymph nodes in the cervical area that were tender.  No thyromegaly.  Her chest was clear.  Was having some left-sided musculoskeletal pain in the hip area.  Her pulse oximetry on room air at the time of the visit was 98% and she was afebrile.  Blood pressure was 110/76 and pulse was 99.  She said she simply did not feel well.  She was treated with Augmentin,  prednisone 10 mg tablets- 6-day tapering course starting with 6 tablets day 1 and decreasing by 1 tablet daily, and Norco 10/325 for musculoskeletal pain.   Regarding her diabetes, her hemoglobin A1c in June was 7%.  She is treated only with metformin 500 mg twice daily.  On  November 30, she presented to the ED and admitted.  Was found to have submassive PE and was treated with anticoagulation.  Dr. Benson Norway performed an endoscopic ultrasound with fine-needle biopsy  but pathology was reported as nondiagnostic material.  Liver functions on December 3 were WNL.   She is scheduled to go on a cruise to the [place] in January for several days.    Review of Systems Patient reports that she began to lose weight  this past summer. Was working second shift  at nursing home and really only eating one meal a day. Now having some diarrhea with eating. Does have remote history of irritable bowel type symptoms.     Objective:   Physical Exam looks fatigued and main complaint is left rib cage pain   VS reviewed not tachypneic  Very tender in left rib cage area- Xrays ordered. Chest clear Cor:RRR      Assessment & Plan:   Normocytic anemia- Hgb 10.6 grams on December 3 and 10.2 grams on December 5.( Hgb was 12.8 grams 10 months ago)  Weight loss:  is now 188 pounds and was 209 lbs in June 2023 and 203 lb 6.4 oz in [name] 2023  Left upper quadrant abdominal pain  Hx of lap cholecystectomy 2014 after presenting with pancreatitis in 2013.Hx chronic cholecystitis and pancreatitis related to gallstones.  Hx of liver bx showing cirrhosis  Remote hx of Hep C treated with Harvoni  Type 2 diabetes mellitus  treated with metformin  Left rib cage pain- rib detail ordered today  PE- mild to moderate distal right pulmonary artery now on Eliquis  Essential HTN treated with losartan-HCTZ and amlodipine  Anxiety treated with Xanax  4.0 x 4.7 cm soft tissue mass suspected in head of pancreas  Left lobe of thyroid enlarged- hx of partial left thyroidectomy in [place] in 1072. TSH has always been WNL  Hx of occasional vertigo  Colonosopy done by Dr. Silverio Decamp 2020- no polyps or masses identified  Vaginal hysterectomy, RSO and posterior repair by Dr. 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 Tamoxifen recommended but she declined to take it. Last mammogram on file [name] 2022  Hx of cholecystectomy  Anorexia  Hx of partial left thyroidectomy in 1972  Anxiety state  Plan: Rib detail films and  appt with Dr, Lilia Argue about pancreatic lesion. Norco 10/325 5 day supply to take sparingly for pain. May need to consider leave of absence.  Plan: She agrees to see Dr. Lilia Argue.  I spoke with his nurse today about an appt.Continue anticoagulation,metformin,losartan HCTZ,Xanax. Have given her #15 tabs Norco 10/325 to take sparingly for pain. Anticoagulation to be continued for now but  will need to be d/ced if biopsy is considered

## 2022-12-05 ENCOUNTER — Telehealth: Payer: Self-pay

## 2022-12-05 NOTE — Telephone Encounter (Signed)
Spoke with patient she does want to stay with Dr Rush Landmark, and waiting till January is fine with her.

## 2022-12-05 NOTE — Telephone Encounter (Signed)
01/16/23 830 am [facility] with GM  Anti coag letter has been sent.    Left message on machine to call back

## 2022-12-05 NOTE — Telephone Encounter (Signed)
EUS scheduled, pt instructed and medications reviewed.  Patient instructions mailed to home.  Patient to call with any questions or concerns.   Will await anti coag response

## 2022-12-05 NOTE — Telephone Encounter (Signed)
Victoria [name], Her CA 19-9 is normal which is good news.  Her recent negative biopsies as well also point towards more of a stricturing process or main duct IPMN but certainly the duct dilation is concerning.  So repeat EUS is reasonable. You may offer this patient my next available EUS slot which I know is in January. Unfortunately since she did not want to have the procedure slot for next week (when we still had it available in the last week) and that slot is already filled we have no earlier availability.   If the patient is uncomfortable with waiting that long for her procedure or Dr. Renold Genta is uncomfortable waiting this long, then the patient will need referral to [facility] to see if they have an earlier availability. Putting patient primary GI doctor on here as well. GM

## 2022-12-05 NOTE — Telephone Encounter (Signed)
-----   Message from Barb Merino, MD sent at 12/05/2022 11:47 AM EST ----- I had seen this patient in the hospital. She should be able to interrupt Eliquis for 48-72 hours as required for the procedure and go back on as soon as possible. If she requires more than 72 hours of interruption, will suggest peri operative bridging with heparin.   Thank you    ----- Message ----- From: Timothy Lasso, RN Sent: 12/05/2022  10:48 AM EST To: Elby Showers, MD; Barb Merino, MD

## 2022-12-05 NOTE — Telephone Encounter (Signed)
The pt has been advised of the 48 hour eliquis hold.  No further questions at this time.

## 2022-12-06 ENCOUNTER — Other Ambulatory Visit: Payer: Medicare HMO

## 2022-12-06 ENCOUNTER — Ambulatory Visit
Admission: RE | Admit: 2022-12-06 | Discharge: 2022-12-06 | Disposition: A | Discharge status: Home / Self Care | Payer: Medicare HMO | Source: Ambulatory Visit | Attending: Internal Medicine | Admitting: Internal Medicine

## 2022-12-06 DIAGNOSIS — E119 Type 2 diabetes mellitus without complications: Secondary | ICD-10-CM | POA: Diagnosis not present

## 2022-12-06 DIAGNOSIS — R0781 Pleurodynia: Secondary | ICD-10-CM | POA: Diagnosis not present

## 2022-12-07 LAB — HEMOGLOBIN A1C
Hgb A1c MFr Bld: 6.7 % of total Hgb — ABNORMAL HIGH (ref <5.7)
Mean Plasma Glucose: 146 mg/dL
eAG (mmol/L): 8.1 mmol/L

## 2022-12-09 ENCOUNTER — Ambulatory Visit (HOSPITAL_COMMUNITY): Admit: 2022-12-09 | Payer: Medicare HMO | Admitting: Gastroenterology

## 2022-12-09 ENCOUNTER — Encounter (HOSPITAL_COMMUNITY): Payer: Self-pay

## 2022-12-09 SURGERY — UPPER ENDOSCOPIC ULTRASOUND (EUS) RADIAL
Anesthesia: Monitor Anesthesia Care

## 2022-12-20 ENCOUNTER — Telehealth: Payer: Self-pay | Admitting: Internal Medicine

## 2022-12-20 ENCOUNTER — Encounter: Payer: Self-pay | Admitting: Internal Medicine

## 2022-12-20 DIAGNOSIS — R1084 Generalized abdominal pain: Secondary | ICD-10-CM

## 2022-12-20 DIAGNOSIS — K8689 Other specified diseases of pancreas: Secondary | ICD-10-CM

## 2022-12-20 MED ORDER — HYDROCODONE-ACETAMINOPHEN 10-325 MG PO TABS: 1.0000 | ORAL_TABLET | Freq: Three times a day (TID) | ORAL | 0 refills | Status: AC | PRN

## 2022-12-20 NOTE — Telephone Encounter (Addendum)
Victoria [name] [phone]  [name] called to see if she could get a refill on below medication, so she can have it when she goes out of town next week. Leaving 12/26/2022  HYDROcodone-acetaminophen (NORCO/VICODIN) 10-325 MG   She is being evaluated by GI for pancreatic mass and scheduled for a procedure in January. She is going out of town on a cruise.Refill Norco 10/325 to take one half to one tab every 8 hours sparingly for pain #15 tabs sent in. MJB, MD

## 2022-12-25 ENCOUNTER — Telehealth: Payer: Self-pay | Admitting: Internal Medicine

## 2022-12-25 MED ORDER — PANTOPRAZOLE SODIUM 40 MG PO TBEC: 40.0000 mg | DELAYED_RELEASE_TABLET | Freq: Every day | ORAL | 0 refills | Status: DC

## 2022-12-25 NOTE — Telephone Encounter (Signed)
Victoria [name] [phone]  Evan called to either get an appointment or a prescription for gastritis, she is having lower left side pain around her ribs. She said she got a medication a long time ago that helped.

## 2022-12-25 NOTE — Telephone Encounter (Signed)
After going back in chart with Dr Renold Genta it looks like it was probably Protonix (pantopazole). When I called patient and let her know she had been prescribed this around 2013 by Dr Conard Novak she said that was it. She would like to get a prescription of this. She is leaving on her trip this coming Friday.

## 2022-12-25 NOTE — Telephone Encounter (Signed)
Rx sent to the pharmacy and patient notified

## 2022-12-27 ENCOUNTER — Other Ambulatory Visit: Payer: Self-pay

## 2022-12-27 ENCOUNTER — Emergency Department (HOSPITAL_COMMUNITY): Payer: Medicare HMO

## 2022-12-27 ENCOUNTER — Emergency Department (HOSPITAL_COMMUNITY)
Admission: EM | Admit: 2022-12-27 | Discharge: 2022-12-27 | Disposition: A | Discharge status: Home / Self Care | Payer: Medicare HMO | Attending: Emergency Medicine | Admitting: Emergency Medicine

## 2022-12-27 DIAGNOSIS — K5730 Diverticulosis of large intestine without perforation or abscess without bleeding: Secondary | ICD-10-CM | POA: Diagnosis not present

## 2022-12-27 DIAGNOSIS — K869 Disease of pancreas, unspecified: Secondary | ICD-10-CM | POA: Insufficient documentation

## 2022-12-27 DIAGNOSIS — R000 Tachycardia, unspecified: Secondary | ICD-10-CM | POA: Diagnosis not present

## 2022-12-27 DIAGNOSIS — R591 Generalized enlarged lymph nodes: Secondary | ICD-10-CM

## 2022-12-27 DIAGNOSIS — R590 Localized enlarged lymph nodes: Secondary | ICD-10-CM | POA: Insufficient documentation

## 2022-12-27 DIAGNOSIS — K8689 Other specified diseases of pancreas: Secondary | ICD-10-CM

## 2022-12-27 DIAGNOSIS — K838 Other specified diseases of biliary tract: Secondary | ICD-10-CM | POA: Diagnosis not present

## 2022-12-27 LAB — COMPREHENSIVE METABOLIC PANEL
ALT: 13 U/L (ref 0–44)
AST: 20 U/L (ref 15–41)
Albumin: 3.4 g/dL — ABNORMAL LOW (ref 3.5–5.0)
Alkaline Phosphatase: 57 U/L (ref 38–126)
Anion gap: 13 (ref 5–15)
BUN: 11 mg/dL (ref 8–23)
CO2: 24 mmol/L (ref 22–32)
Calcium: 8.9 mg/dL (ref 8.9–10.3)
Chloride: 95 mmol/L — ABNORMAL LOW (ref 98–111)
Creatinine, Ser: 0.91 mg/dL (ref 0.44–1.00)
GFR, Estimated: >60 mL/min (ref >60)
Glucose, Bld: 181 mg/dL — ABNORMAL HIGH (ref 70–99)
Potassium: 3.7 mmol/L (ref 3.5–5.1)
Sodium: 132 mmol/L — ABNORMAL LOW (ref 135–145)
Total Bilirubin: 0.8 mg/dL (ref 0.3–1.2)
Total Protein: 7.3 g/dL (ref 6.5–8.1)

## 2022-12-27 LAB — CBC
HCT: 32.1 % — ABNORMAL LOW (ref 36.0–46.0)
Hemoglobin: 11 g/dL — ABNORMAL LOW (ref 12.0–15.0)
MCH: 28.1 pg (ref 26.0–34.0)
MCHC: 34.3 g/dL (ref 30.0–36.0)
MCV: 81.9 fL (ref 80.0–100.0)
Platelets: 261 10*3/uL (ref 150–400)
RBC: 3.92 MIL/uL (ref 3.87–5.11)
RDW: 15.1 % (ref 11.5–15.5)
WBC: 8.5 10*3/uL (ref 4.0–10.5)
nRBC: 0 % (ref 0.0–0.2)

## 2022-12-27 LAB — LIPASE, BLOOD: Lipase: 27 U/L (ref 11–51)

## 2022-12-27 MED ORDER — SODIUM CHLORIDE 0.9 % IV BOLUS (SEPSIS)
500.0000 mL | Freq: Once | INTRAVENOUS | Status: AC
  Administered 2022-12-27: 500 mL via INTRAVENOUS

## 2022-12-27 MED ORDER — HYDROCODONE-ACETAMINOPHEN 5-325 MG PO TABS: 1.0000 | ORAL_TABLET | Freq: Four times a day (QID) | ORAL | 0 refills | Status: DC | PRN

## 2022-12-27 MED ORDER — HYDROMORPHONE HCL 1 MG/ML IJ SOLN
1.0000 mg | Freq: Once | INTRAMUSCULAR | Status: AC
  Administered 2022-12-27: 1 mg via INTRAVENOUS
  Filled 2022-12-27: qty 1

## 2022-12-27 MED ORDER — DOCUSATE SODIUM 100 MG PO CAPS: 100.0000 mg | ORAL_CAPSULE | Freq: Every day | ORAL | 0 refills | Status: DC

## 2022-12-27 MED ORDER — ONDANSETRON 4 MG PO TBDP
4.0000 mg | ORAL_TABLET | Freq: Once | ORAL | Status: AC
  Administered 2022-12-27: 4 mg via ORAL
  Filled 2022-12-27: qty 1

## 2022-12-27 MED ORDER — IOHEXOL 350 MG/ML SOLN
75.0000 mL | Freq: Once | INTRAVENOUS | Status: AC | PRN
  Administered 2022-12-27: 75 mL via INTRAVENOUS

## 2022-12-27 MED ORDER — OXYCODONE-ACETAMINOPHEN 5-325 MG PO TABS
1.0000 | ORAL_TABLET | Freq: Once | ORAL | Status: AC
  Administered 2022-12-27: 1 via ORAL
  Filled 2022-12-27: qty 1

## 2022-12-27 MED ORDER — SODIUM CHLORIDE 0.9 % IV SOLN
1000.0000 mL | INTRAVENOUS | Status: DC
  Administered 2022-12-27: 1000 mL via INTRAVENOUS

## 2022-12-27 NOTE — ED Provider Notes (Signed)
[facility] EMERGENCY DEPARTMENT Provider Note   CSN: [id number] Arrival date & time: 12/27/22  0715     History  Chief Complaint  Patient presents with   Abdominal Pain    Victoria [name] is a 82 y.o. female.   Abdominal Pain    Patient presents to the ED for evaluation of persistent abdominal pain.  Patient states she has seen her primary care doctor as well as a GI doctor.  Patient states she has findings that are concerning for the possibility of pancreatic cancer.  Patient states she previously had an endoscopy but her doctor told her that they were unable to do a biopsy.  Patient states she is not scheduled until January now for biopsy.  She previously had an MRI that showed findings concerning for the possibility of pancreatic mass.  Patient states last night she had severe increasing pain.  She has had nausea but no vomiting.  She has had some soft stools but no frequent diarrhea.  Home Medications Prior to Admission medications   Medication Sig Start Date End Date Taking? Authorizing Provider  docusate sodium (COLACE) 100 MG capsule Take 1 capsule (100 mg total) by mouth daily. 12/27/22  Yes Dorie Rank, MD  HYDROcodone-acetaminophen (NORCO/VICODIN) 5-325 MG tablet Take 1 tablet by mouth every 6 (six) hours as needed. 12/27/22  Yes Dorie Rank, MD  albuterol (VENTOLIN HFA) 108 (90 Base) MCG/ACT inhaler Inhale 2 puffs into the lungs every 6 (six) hours as needed for wheezing or shortness of breath. 08/15/22   Baxley, Cresenciano Lick, MD  ALPRAZolam (XANAX) 0.5 MG tablet TAKE 1 TABLET(0.5 MG) BY MOUTH TWICE DAILY AS NEEDED FOR ANXIETY Patient taking differently: Take 0.5 mg by mouth 2 (two) times daily. 08/12/22   Elby Showers, MD  amLODipine (NORVASC) 2.5 MG tablet TAKE 1 TABLET(2.5 MG) BY MOUTH AT BEDTIME Patient taking differently: Take 2.5 mg by mouth every evening. 08/04/22   Elby Showers, MD  apixaban (ELIQUIS) 5 MG TABS tablet Take 1 tablet (5 mg total) by mouth 2  (two) times daily. 12/27/22   Barb Merino, MD  APIXABAN Arne [place]) VTE STARTER PACK ('10MG'$  AND '5MG'$ ) Take as directed on package: start with two-'5mg'$  tablets twice daily for 7 days. On day 8, switch to one-'5mg'$  tablet twice daily. 11/26/22   Barb Merino, MD  Blood Glucose Monitoring Suppl (TRUE METRIX METER) w/Device KIT USE AS DIRECTED 07/30/21   Elby Showers, MD  Cetirizine HCl (ZYRTEC ALLERGY PO) Take 1 tablet by mouth daily as needed (allergies).     [provider]  cholecalciferol (VITAMIN D3) 25 MCG (1000 UNIT) tablet Take 1,000 Units by mouth daily.    [provider]  glucose blood (TRUE METRIX BLOOD GLUCOSE TEST) test strip TEST BLOOD SUGAR TWICE DAILY 10/10/22   Elby Showers, MD  Lancets (FREESTYLE) lancets CHECK GLUCOSE TWICE A DAY 01/26/20   Elby Showers, MD  losartan-hydrochlorothiazide (HYZAAR) 100-25 MG tablet Take 1 tablet by mouth daily. 12/03/21   Elby Showers, MD  metFORMIN (GLUCOPHAGE) 500 MG tablet TAKE 1 TABLET(500 MG) BY MOUTH TWICE DAILY WITH A MEAL Patient taking differently: Take 500 mg by mouth 2 (two) times daily with a meal. 11/20/22   Baxley, Cresenciano Lick, MD  pantoprazole (PROTONIX) 40 MG tablet Take 1 tablet (40 mg total) by mouth daily. 12/25/22   Elby Showers, MD      Allergies    Lisinopril    Review of Systems  Review of Systems  Gastrointestinal:  Positive for abdominal pain.    Physical Exam Updated Vital Signs BP (!) 146/86   Pulse 71   Temp 97.7 F (36.5 C) (Oral)   Resp 11   Ht 1.702 m ('5\' 7"'$ )   Wt 83.9 kg   SpO2 97%   BMI 28.98 kg/m  Physical Exam Vitals and nursing note reviewed.  Constitutional:      Appearance: She is well-developed. She is ill-appearing.  HENT:     Head: Normocephalic and atraumatic.     Right Ear: External ear normal.     Left Ear: External ear normal.  Eyes:     General: No scleral icterus.       Right eye: No discharge.        Left eye: No discharge.     Conjunctiva/sclera: Conjunctivae  normal.  Neck:     Trachea: No tracheal deviation.  Cardiovascular:     Rate and Rhythm: Normal rate and regular rhythm.  Pulmonary:     Effort: Pulmonary effort is normal. No respiratory distress.     Breath sounds: Normal breath sounds. No stridor. No wheezing or rales.  Abdominal:     General: Bowel sounds are normal. There is no distension.     Palpations: Abdomen is soft.     Tenderness: There is abdominal tenderness in the epigastric area. There is no guarding or rebound.  Musculoskeletal:        General: No tenderness or deformity.     Cervical back: Neck supple.  Skin:    General: Skin is warm and dry.     Findings: No rash.  Neurological:     General: No focal deficit present.     Mental Status: She is alert.     Cranial Nerves: No cranial nerve deficit, dysarthria or facial asymmetry.     Sensory: No sensory deficit.     Motor: No abnormal muscle tone or seizure activity.     Coordination: Coordination normal.  Psychiatric:        Mood and Affect: Mood normal.     ED Results / Procedures / Treatments   Labs (all labs ordered are listed, but only abnormal results are displayed) Labs Reviewed  COMPREHENSIVE METABOLIC PANEL - Abnormal; Notable for the following components:      Result Value   Sodium 132 (*)    Chloride 95 (*)    Glucose, Bld 181 (*)    Albumin 3.4 (*)    All other components within normal limits  CBC - Abnormal; Notable for the following components:   Hemoglobin 11.0 (*)    HCT 32.1 (*)    All other components within normal limits  LIPASE, BLOOD    EKG EKG Interpretation  Date/Time:  Friday December 27 2022 07:41:43 EST Ventricular Rate:  111 PR Interval:  154 QRS Duration: 82 QT Interval:  342 QTC Calculation: 465 R Axis:   6 Text Interpretation: Sinus tachycardia Cannot rule out Anterior infarct , age undetermined Abnormal ECG When compared with ECG of 21-Nov-2022 10:02, Since last tracing rate faster Confirmed by Dorie Rank [phone])  on 12/27/2022 11:40:58 AM  Radiology CT ABDOMEN PELVIS W CONTRAST  Result Date: 12/27/2022 CLINICAL DATA:  Acute severe abdominal pain EXAM: CT ABDOMEN AND PELVIS WITH CONTRAST TECHNIQUE: Multidetector CT imaging of the abdomen and pelvis was performed using the standard protocol following bolus administration of intravenous contrast. RADIATION DOSE REDUCTION: This exam was performed according to the departmental dose-optimization program which  includes automated exposure control, adjustment of the mA and/or kV according to patient size and/or use of iterative reconstruction technique. CONTRAST:  66m OMNIPAQUE IOHEXOL 350 MG/ML SOLN COMPARISON:  Previous CT done on 06/07/2013 and MR abdomen done on 11/21/2022 FINDINGS: Lower chest: There are numerous new small patchy nodular densities in both lower lung fields. There is ectasia of bronchi in the lower lung fields. There is small Bochdalek's hernia in right hemidiaphragm containing fat. Hepatobiliary: There is nodularity in liver surface. No focal abnormalities are seen. Surgical clips are seen in gallbladder fossa. There is no dilation of bile ducts. Pancreas: There is marked dilation of pancreatic duct in the body and tail measuring up to 1.4 cm. There is abrupt change in caliber of pancreatic duct in the head of the pancreas without significant dilation. There is marked parenchymal atrophy in the body and tail of pancreas. There is patchy attenuation in the head of the pancreas without discrete measurable nodule/mass. Spleen: Unremarkable. Adrenals/Urinary Tract: Adrenals are unremarkable. There is no hydronephrosis. There are no renal or ureteral stones. There are few smooth marginated low-density lesions in both kidneys measuring up to 2 cm in size suggesting renal cysts. No follow-up imaging is recommended. Ureters are not dilated. Urinary bladder is not distended. Stomach/Bowel: Stomach is not distended. Small bowel loops are unremarkable. Appendix is not  dilated. Tip of appendix is noted in right pelvic cavity slightly below the level of pelvic inlet. Multiple diverticula are seen in colon. There is no evidence of focal acute diverticulitis. Vascular/Lymphatic: Scattered arterial calcifications are seen. There are abnormally enlarged lymph nodes adjacent to put a bodies, adjacent to head of pancreas and retroperitoneum measuring up to 1.5 cm in short axis. Reproductive: Uterus is not seen. Other: There is no ascites or pneumoperitoneum. Umbilical hernia containing fat is seen. Bilateral inguinal hernias containing fat are noted. Musculoskeletal: Degenerative changes are noted in lower thoracic spine and lumbar spine. IMPRESSION: There is no evidence of intestinal obstruction or pneumoperitoneum. There is no hydronephrosis. Appendix is not dilated. There is marked dilation of pancreatic duct in the body and tail with abrupt change in caliber in the neck. Possibility of an obstructing neoplastic process in pancreas should be considered. There are abnormally enlarged lymph nodes adjacent to porta hepatis, adjacent to the head of the pancreas, lesser curvature aspect of stomach and retroperitoneum suggesting possible metastatic lymphadenopathy. There are numerous patchy nodular densities of varying sizes scattered throughout the visualized lower lung fields. This finding may suggest pulmonary metastatic disease. Less likely possibility would be multifocal pneumonia. There is nodularity in liver surface suggesting cirrhosis. No focal abnormalities are seen in liver. Diverticulosis of colon without signs of focal acute diverticulitis. Other findings as described in the body of the report. There is marked dilation of pancreatic duct in the body and tail with abrupt change in caliber in the neck of the pancreas. Possibility of a benign or malignant stricture due to neoplastic process is not excluded. Electronically Signed   By: PElmer PickerM.D.   On: 12/27/2022  13:57    Procedures Procedures    Medications Ordered in ED Medications  sodium chloride 0.9 % bolus 500 mL (0 mLs Intravenous Stopped 12/27/22 1401)    Followed by  0.9 %  sodium chloride infusion (1,000 mLs Intravenous New Bag/Given 12/27/22 1407)  oxyCODONE-acetaminophen (PERCOCET/ROXICET) 5-325 MG per tablet 1 tablet (1 tablet Oral Given 12/27/22 0740)  ondansetron (ZOFRAN-ODT) disintegrating tablet 4 mg (4 mg Oral Given 12/27/22 0741)  HYDROmorphone (  DILAUDID) injection 1 mg (1 mg Intravenous Given 12/27/22 1252)  iohexol (OMNIPAQUE) 350 MG/ML injection 75 mL (75 mLs Intravenous Contrast Given 12/27/22 1334)    ED Course/ Medical Decision Making/ A&P Clinical Course as of 12/27/22 1506  Fri Dec 27, 2022  1241 Glucose(!): 181 [JK]    Clinical Course User Index [JK] Dorie Rank, MD                           Medical Decision Making Problems Addressed: Lymphadenopathy: chronic illness or injury with exacerbation, progression, or side effects of treatment Pancreatic mass: chronic illness or injury with exacerbation, progression, or side effects of treatment  Amount and/or Complexity of Data Reviewed External Data Reviewed:     Details: I reviewed previous imaging results including an MRI of her abdomen back in November.  I also reviewed patient GI notes.  It does appear the patient did have a biopsy performed.  The results were actually negative.  Patient also had a CA 19 level that was normal and felt to be reassuring.  Notes indicate there is a plan for repeat endoscopy and possibly biopsy but patient did actually have a biopsy performed  Labs: ordered. Decision-making details documented in ED Course. Radiology: ordered and independent interpretation performed.  Risk OTC drugs. Prescription drug management.   Patient presented to the ED for evaluation of persistent abdominal pain.  Patient has an abnormal pancreatic mass that was noted on prior CT scans few months ago.  She has  followed up with gastroenterology and had a biopsy that was reportedly negative however there are plans for repeat biopsy later this month.  She presented because she was having increasing pain.  She is more comfortable now after being given a dose of pain medications.  Patient's laboratory tests are unremarkable.  Her CT scan does show this persistent pancreatic mass and there is enlarged lymph nodes that are concerning for metastatic disease.  Lung nodules noted.  I doubt acute infection.  Patient has not had any respiratory symptoms and she does not have a fever or white count.  Patient does have plans to follow-up with gastroenterology.  I have placed a hematology oncology referral.  Patient also has close follow-up with her primary care doctor.  Patient has a trip planned for this evening that she would still like to go on.  I will write her prescription for pain medications to help with the discomfort associated with this mass that is concerning for pancreatic malignancy.        Final Clinical Impression(s) / ED Diagnoses Final diagnoses:  Pancreatic mass  Lymphadenopathy    Rx / DC Orders ED Discharge Orders          Ordered    Ambulatory referral to Hematology / Oncology       Comments: Your emergency department provider has referred you to see a hematology/oncology specialist. These are physicians who specialize in blood disorders and cancers, or findings concerning for cancer. You will receive a phone call from the [facility] to set up your appointment within 2 business days: [employer] operate Mon - Fri, 8:00 a.m. to 5:00 p.m.; closed for federally recognized holidays. Please be sure your phone is not set to block numbers during this time. You may call [phone] at any point during [facility] hours of operation to make an appointment as well.   12/27/22 1455    HYDROcodone-acetaminophen (NORCO/VICODIN) 5-325 MG tablet  Every 6  hours PRN        12/27/22 1455     docusate sodium (COLACE) 100 MG capsule  Daily        12/27/22 1455              Dorie Rank, MD 12/27/22 1506

## 2022-12-27 NOTE — ED Triage Notes (Addendum)
Pt. Stated, I have pancreeas cancer they said and I was suppose to go to Cancer appt but didn't make it. My stomach is hurting so bad. My Dr. Hanley Seamen me Protonix but it didn't do anything.

## 2022-12-27 NOTE — ED Notes (Signed)
Put in IV team consult

## 2022-12-27 NOTE — Discharge Instructions (Addendum)
Continue to follow-up with your GI doctor as planned and continue to see your primary care doctor to continue evaluation of the pancreatic mass..  I have also placed a referral to the hematology oncology department.  Take the medications as needed for pain.  Take a stool softener with that to help prevent constipation.

## 2022-12-27 NOTE — ED Provider Triage Note (Signed)
Emergency Medicine Provider Triage Evaluation Note  Victoria [name] , a 82 y.o. female  was evaluated in triage.  Pt complains of worsening abdominal pain.  With active pancreatic cancer.  Supposed to see oncologist but couldn't make it.  Doesn't establish care with GI until later this month.  Pain in band like fashion around upper abdomen.  Seen in ED several weeks prior for similar, though states today is worse.  Tried Protonix, with little improvement.  Denies fevers, chills, vomiting, abnormal bowel movements, or urinary changes.    Hx prior partial hysterectomy.  Per nursing staff was initially provided pain medicine and Zofran in triage, patient reports some improvement with this however states pain is slowly starting to return.  Review of Systems  Positive:  Negative: See above  Physical Exam  BP 126/79   Pulse 85   Temp 98 F (36.7 C)   Resp 16   Ht '5\' 7"'$  (1.702 m)   Wt 83.9 kg   SpO2 95%   BMI 28.98 kg/m  Gen:   Awake, no distress   Resp:  Normal effort  MSK:   Moves extremities without difficulty  Other:  Abdomen soft, mildly distended, tender along upper area.    Medical Decision Making  Medically screening exam initiated at 9:47 AM.  Appropriate orders placed.  Victoria [name] was informed that the remainder of the evaluation will be completed by another provider, this initial triage assessment does not replace that evaluation, and the importance of remaining in the ED until their evaluation is complete.     Prince Rome, [place] [birth year] [phone]

## 2022-12-30 ENCOUNTER — Telehealth: Payer: Self-pay

## 2022-12-30 NOTE — Telephone Encounter (Signed)
Placed call to the pt to confirm she can do 1/18 at 345 pm.  Left message on machine to call back

## 2022-12-30 NOTE — Telephone Encounter (Signed)
-----   Message from Irving Copas., MD sent at 12/28/2022 12:29 AM EST ----- Regarding: Follow-up on recent ED visit Victoria [name], I received a message from the patient's PCP, Dr. Renold Genta, about this patient.  She is scheduled with me at the end of this month for EUS.  Looks like CT scan findings have become more concerning.  I see in the schedule that I may have availability on the 18th to try to move this patient to that particular date.  If possible please offer the patient that particular date if it is still available that we can try and get her EUS completed sooner. As we have previously documented, due to her desire of having a procedure when we had an opening in December this led [place] to have to push her procedure date to my next available and it was her decision to remain in [facility] so rather than referred out for potential EUS to be done sooner elsewhere. This is the soonest that I can be available, if we still have an opening on the 18th.  If it is open, then please let us know (PCP and Dr. Silverio Decamp and myself) if she accepts that date. Thanks. GM

## 2022-12-31 NOTE — Telephone Encounter (Signed)
Left message on machine to call back  

## 2023-01-01 NOTE — Telephone Encounter (Signed)
Dr Rush Landmark is it ok to use the 18th for this pt ?

## 2023-01-01 NOTE — Telephone Encounter (Signed)
Patty, Thanks for trying. We'll keep her on her current date scheduled. Keep that date open (I have a patient who will need to take the open date for 1/18 now). Thanks. GM

## 2023-01-01 NOTE — Telephone Encounter (Signed)
I have made multiple attempts to reach the pt without success.  I have not moved her appt to 1/18 since I am unable to reach her to discuss.

## 2023-01-01 NOTE — Telephone Encounter (Signed)
This pt has been moved to 01/09/23 at 345 pm.  She should arrive at 215 pm at [facility].  The granddaughter has been advised.

## 2023-01-01 NOTE — Telephone Encounter (Signed)
Thank you. MJB, thanks for your persistence in regards to your patient's care. I will see her on the 18th. GM

## 2023-01-03 ENCOUNTER — Telehealth: Payer: Self-pay | Admitting: Internal Medicine

## 2023-01-03 NOTE — Telephone Encounter (Signed)
-----  Message from Elby Showers, MD sent at 12/28/2022  7:00 PM EST ----- Regarding: FW: Follow-up on recent ED visit Please let patient knows [facility] is trying to get her a sooner appt and she needs to call them ----- Message ----- From: Irving Copas., MD Sent: 12/28/2022  12:32 AM EST To: Timothy Lasso, RN; Elby Showers, MD; # Subject: Follow-up on recent ED visit                   Patty, I received a message from the patient's PCP, Dr. Renold Genta, about this patient.  She is scheduled with me at the end of this month for EUS.  Looks like CT scan findings have become more concerning.  I see in the schedule that I may have availability on the 18th to try to move this patient to that particular date.  If possible please offer the patient that particular date if it is still available that we can try and get her EUS completed sooner. As we have previously documented, due to her desire of having a procedure when we had an opening in December this led [place] to have to push her procedure date to my next available and it was her decision to remain in [facility] so rather than referred out for potential EUS to be done sooner elsewhere. This is the soonest that I can be available, if we still have an opening on the 18th.  If it is open, then please let us know (PCP and Dr. Silverio Decamp and myself) if she accepts that date. Thanks. GM

## 2023-01-03 NOTE — Telephone Encounter (Signed)
LVM to call GI office as soon as possible

## 2023-01-07 ENCOUNTER — Encounter (HOSPITAL_COMMUNITY): Payer: Self-pay | Admitting: Gastroenterology

## 2023-01-09 ENCOUNTER — Other Ambulatory Visit: Payer: Self-pay

## 2023-01-09 ENCOUNTER — Encounter (HOSPITAL_COMMUNITY)
Admission: RE | Disposition: A | Discharge status: Home / Self Care | Payer: Self-pay | Source: Home / Self Care | Attending: Gastroenterology

## 2023-01-09 ENCOUNTER — Ambulatory Visit (HOSPITAL_COMMUNITY)
Admission: RE | Admit: 2023-01-09 | Discharge: 2023-01-09 | Disposition: A | Discharge status: Home / Self Care | Payer: Medicare HMO | Attending: Gastroenterology | Admitting: Gastroenterology

## 2023-01-09 ENCOUNTER — Ambulatory Visit (HOSPITAL_COMMUNITY): Payer: Medicare HMO | Admitting: Anesthesiology

## 2023-01-09 ENCOUNTER — Ambulatory Visit (HOSPITAL_BASED_OUTPATIENT_CLINIC_OR_DEPARTMENT_OTHER): Payer: Medicare HMO | Admitting: Anesthesiology

## 2023-01-09 ENCOUNTER — Encounter (HOSPITAL_COMMUNITY): Payer: Self-pay | Admitting: Gastroenterology

## 2023-01-09 DIAGNOSIS — K8689 Other specified diseases of pancreas: Secondary | ICD-10-CM | POA: Diagnosis not present

## 2023-01-09 DIAGNOSIS — K319 Disease of stomach and duodenum, unspecified: Secondary | ICD-10-CM | POA: Diagnosis not present

## 2023-01-09 DIAGNOSIS — K449 Diaphragmatic hernia without obstruction or gangrene: Secondary | ICD-10-CM | POA: Insufficient documentation

## 2023-01-09 DIAGNOSIS — I10 Essential (primary) hypertension: Secondary | ICD-10-CM | POA: Diagnosis not present

## 2023-01-09 DIAGNOSIS — K317 Polyp of stomach and duodenum: Secondary | ICD-10-CM | POA: Insufficient documentation

## 2023-01-09 DIAGNOSIS — Z800 Family history of malignant neoplasm of digestive organs: Secondary | ICD-10-CM | POA: Insufficient documentation

## 2023-01-09 DIAGNOSIS — R8569 Abnormal cytological findings in specimens from other digestive organs and abdominal cavity: Secondary | ICD-10-CM | POA: Diagnosis not present

## 2023-01-09 DIAGNOSIS — K3189 Other diseases of stomach and duodenum: Secondary | ICD-10-CM | POA: Insufficient documentation

## 2023-01-09 DIAGNOSIS — K2950 Unspecified chronic gastritis without bleeding: Secondary | ICD-10-CM | POA: Diagnosis not present

## 2023-01-09 DIAGNOSIS — Z7984 Long term (current) use of oral hypoglycemic drugs: Secondary | ICD-10-CM

## 2023-01-09 DIAGNOSIS — R933 Abnormal findings on diagnostic imaging of other parts of digestive tract: Secondary | ICD-10-CM | POA: Insufficient documentation

## 2023-01-09 DIAGNOSIS — F419 Anxiety disorder, unspecified: Secondary | ICD-10-CM | POA: Diagnosis not present

## 2023-01-09 DIAGNOSIS — R590 Localized enlarged lymph nodes: Secondary | ICD-10-CM | POA: Diagnosis not present

## 2023-01-09 DIAGNOSIS — Z853 Personal history of malignant neoplasm of breast: Secondary | ICD-10-CM | POA: Insufficient documentation

## 2023-01-09 DIAGNOSIS — K31A19 Gastric intestinal metaplasia without dysplasia, unspecified site: Secondary | ICD-10-CM | POA: Diagnosis not present

## 2023-01-09 DIAGNOSIS — K838 Other specified diseases of biliary tract: Secondary | ICD-10-CM

## 2023-01-09 DIAGNOSIS — C250 Malignant neoplasm of head of pancreas: Secondary | ICD-10-CM | POA: Insufficient documentation

## 2023-01-09 DIAGNOSIS — Z83719 Family history of colon polyps, unspecified: Secondary | ICD-10-CM | POA: Insufficient documentation

## 2023-01-09 DIAGNOSIS — K2289 Other specified disease of esophagus: Secondary | ICD-10-CM

## 2023-01-09 DIAGNOSIS — I898 Other specified noninfective disorders of lymphatic vessels and lymph nodes: Secondary | ICD-10-CM | POA: Diagnosis not present

## 2023-01-09 DIAGNOSIS — E119 Type 2 diabetes mellitus without complications: Secondary | ICD-10-CM | POA: Insufficient documentation

## 2023-01-09 DIAGNOSIS — Z833 Family history of diabetes mellitus: Secondary | ICD-10-CM | POA: Insufficient documentation

## 2023-01-09 DIAGNOSIS — Z5986 Financial insecurity: Secondary | ICD-10-CM | POA: Diagnosis not present

## 2023-01-09 HISTORY — PX: EUS: SHX5427

## 2023-01-09 HISTORY — PX: FINE NEEDLE ASPIRATION: SHX5430

## 2023-01-09 HISTORY — PX: BIOPSY: SHX5522

## 2023-01-09 HISTORY — PX: ESOPHAGOGASTRODUODENOSCOPY (EGD) WITH PROPOFOL: SHX5813

## 2023-01-09 LAB — GLUCOSE, CAPILLARY: Glucose-Capillary: 136 mg/dL — ABNORMAL HIGH (ref 70–99)

## 2023-01-09 SURGERY — ESOPHAGOGASTRODUODENOSCOPY (EGD) WITH PROPOFOL
Anesthesia: Monitor Anesthesia Care

## 2023-01-09 MED ORDER — LACTATED RINGERS IV SOLN
INTRAVENOUS | Status: AC | PRN
  Administered 2023-01-09: 20 mL/h via INTRAVENOUS

## 2023-01-09 MED ORDER — LIDOCAINE 2% (20 MG/ML) 5 ML SYRINGE
INTRAMUSCULAR | Status: DC | PRN
  Administered 2023-01-09: 100 mg via INTRAVENOUS

## 2023-01-09 MED ORDER — CIPROFLOXACIN IN D5W 400 MG/200ML IV SOLN
INTRAVENOUS | Status: DC | PRN
  Administered 2023-01-09: 400 mg via INTRAVENOUS

## 2023-01-09 MED ORDER — PROPOFOL 500 MG/50ML IV EMUL
INTRAVENOUS | Status: DC | PRN
  Administered 2023-01-09: 100 ug/kg/min via INTRAVENOUS

## 2023-01-09 MED ORDER — CIPROFLOXACIN IN D5W 400 MG/200ML IV SOLN
INTRAVENOUS | Status: AC
  Filled 2023-01-09: qty 200

## 2023-01-09 MED ORDER — APIXABAN 5 MG PO TABS: 5.0000 mg | ORAL_TABLET | Freq: Two times a day (BID) | ORAL | 2 refills | Status: DC

## 2023-01-09 MED ORDER — CIPROFLOXACIN HCL 500 MG PO TABS: 500.0000 mg | ORAL_TABLET | Freq: Two times a day (BID) | ORAL | 0 refills | Status: AC

## 2023-01-09 MED ORDER — SODIUM CHLORIDE 0.9 % IV SOLN: INTRAVENOUS | Status: DC

## 2023-01-09 MED ORDER — PROPOFOL 10 MG/ML IV BOLUS
INTRAVENOUS | Status: DC | PRN
  Administered 2023-01-09: 20 mg via INTRAVENOUS

## 2023-01-09 NOTE — Op Note (Signed)
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                            Alday CRNA, CRNA Referring MD:             Cresenciano Lick. Baxley, Carol Ada, MD, Mauri Pole, MD Medicines:                Monitored Anesthesia Care, Cipro 384 mg IV Complications:            No immediate complications. Estimated Blood Loss:     Estimated blood loss was minimal. Procedure:                Pre-Anesthesia Assessment:                           - Prior to the procedure, a History and Physical                            was performed, and patient medications and                            allergies were reviewed. The patient's tolerance of                            previous anesthesia was also reviewed. The risks                            and benefits of the procedure and the sedation                            options and risks were discussed with the patient.  All questions were answered, and informed consent                            was obtained. Prior Anticoagulants: The patient has                            taken Eliquis (apixaban), last dose was 2 days                            prior to procedure. ASA Grade Assessment: III - A                            patient with severe  systemic disease. After                            reviewing the risks and benefits, the patient was                            deemed in satisfactory condition to undergo the                            procedure.                           After obtaining informed consent, the endoscope was                            passed under direct vision. Throughout the                            procedure, the patient's blood pressure, pulse, and                            oxygen saturations were monitored continuously. The                            [id number]-H190 ([id number]) Olympus endoscope was introduced                            through the mouth, and advanced to the second part                            of duodenum. The [id number]-[id number] ([id number]) Olympus                            duodenoscope was introduced through the mouth, and                            advanced to the area of papilla. The [id number]                            ([id number]) Olympus linear ultrasound scope was  introduced through the mouth, and advanced to the                            duodenum for ultrasound examination from the                            stomach and duodenum. The upper EUS was                            accomplished without difficulty. The patient                            tolerated the procedure. Scope In: Scope Out: Findings:      ENDOSCOPIC FINDING: :      No gross lesions were noted in the entire esophagus.      The Z-line was irregular and was found 36 cm from the incisors.      A 2 cm hiatal hernia was present.      Patchy mildly erythematous mucosa without bleeding was found in the       entire examined stomach. Biopsies were taken with a cold forceps for       histology and Helicobacter pylori testing.      A slight fishmouth deformity was found at the major papillary orifice       with pancreatic secretions noted.      No other gross lesions were noted in the duodenal bulb, in the  first       portion of the duodenum and in the second portion of the duodenum.      ENDOSONOGRAPHIC FINDING: :      An irregular masslike region was identified in the pancreatic head. The       mass was hypoechoic and characterized by shadowing. The area measured 23       mm by 20 mm in maximal cross-sectional diameter. The endosonographic       borders were poorly-defined. There was sonographic evidence suggesting       invasion into the superior mesenteric vein (manifested by abutment). An       intact interface was seen between the mass and the superior mesenteric       artery and celiac trunk suggesting a lack of invasion. The remainder of       the pancreas was examined. The endosonographic appearance of parenchyma       and the upstream pancreatic duct indicated duct dilation (PD dilation up       to 13 mm within the neck), a tortuous/ectatic duct, parenchymal atrophy       and parenchymal calcifications. Within the pancreas duct there did       appear in the body portion to be potential intraluminal growth (distinct       from the mass noted in the head) fine needle biopsy was performed of the       masslike area. Color Doppler imaging was utilized prior to needle       puncture to confirm a lack of significant vascular structures within the       needle path. Seven passes were made with the 22 gauge Acquire biopsy       needle using a transduodenal approach. A visible core of tissue was       obtained. Preliminary cytologic examination  and touch preps were       performed. Final cytology results are pending.      There was dilation in the common bile duct (9 mm).      Two enlarged lymph nodes were visualized in the peripancreatic region.       The largest measured 8 mm by 5 mm in maximal cross-sectional diameter.       The nodes were round, hypoechoic and had well defined margins. Fine       needle biopsy was performed. Color Doppler imaging was utilized prior to       needle  puncture to confirm a lack of significant vascular structures       within the needle path. Three passes were made with the 22 gauge Acquire       biopsy needle using a transgastric approach. A visible core of tissue       was obtained. Final cytology results are pending.      Endosonographic imaging in the visualized portion of the liver showed no       mass.      The celiac region was visualized. Impression:               EGD impression:                           - No gross lesions in the entire esophagus. Z-line                            irregular, 36 cm from the incisors.                           - 2 cm hiatal hernia.                           - Erythematous mucosa in the stomach. Biopsied.                           - Mild fishmouth deformity at the ampullary                            orifice. No other gross lesions in the duodenal                            bulb, in the first portion of the duodenum and in                            the second portion of the duodenum.                           EUS impression:                           - A mass was identified in the pancreatic head.                            Cytology results are pending. However, the  endosonographic appearance is highly suspicious for                            adenocarcinoma. This was staged T2 N1 Mx by                            endosonographic criteria. The staging applies if                            malignancy is confirmed and if lymph nodes are also                            positive. Fine needle biopsy performed. There is                            potential intraluminal growth within the body of                            pancreas duct noted (this is distinct from the                            mass).                           - Two enlarged lymph nodes were visualized in the                            peripancreatic region. Cytology results are                             pending. However, the endosonographic appearance is                            suspicious for metastatic pancreatic                            adenocarcinoma. Fine needle biopsy performed.                           - There was dilation in the common bile duct. Moderate Sedation:      Not Applicable - Patient had care per Anesthesia. Recommendation:           - The patient will be observed post-procedure,                            until all discharge criteria are met.                           - Discharge patient to home.                           - Patient has a contact number available for                            emergencies. The  signs and symptoms of potential                            delayed complications were discussed with the                            patient. Return to normal activities tomorrow.                            Written discharge instructions were provided to the                            patient.                           - Low fat diet.                           - Observe patient's clinical course.                           - Await cytology results and await path results.                           - May restart Eliquis in 48 hours (1/20 p.m.) to                            decrease post interventional bleeding risk.                           - Ciprofloxacin 500 mg twice daily x 3 days.                           - Continue medications otherwise.                           - The findings and recommendations were discussed                            with the patient.                           - The findings and recommendations were discussed                            with the patient's family. Procedure Code(s):        --- Professional ---                           [phone], Esophagogastroduodenoscopy, flexible,                            transoral; with transendoscopic ultrasound-guided                            intramural or transmural fine needle  aspiration/biopsy(s), (includes endoscopic                            ultrasound examination limited to the esophagus,                            stomach or duodenum, and adjacent structures)                           43239, 59, Esophagogastroduodenoscopy, flexible,                            transoral; with biopsy, single or multiple Diagnosis Code(s):        --- Professional ---                           K22.89, Other specified disease of esophagus                           K44.9, Diaphragmatic hernia without obstruction or                            gangrene                           K31.89, Other diseases of stomach and duodenum                           K86.89, Other specified diseases of pancreas                           R59.0, Localized enlarged lymph nodes                           R93.3, Abnormal findings on diagnostic imaging of                            other parts of digestive tract                           R10.10, Upper abdominal pain, unspecified                           K83.8, Other specified diseases of biliary tract CPT copyright 2022 American Medical Association. All rights reserved. The codes documented in this report are preliminary and upon coder review may  be revised to meet current compliance requirements. Justice Britain, MD 01/09/2023 5:09:55 PM Number of Addenda: 0

## 2023-01-09 NOTE — Anesthesia Postprocedure Evaluation (Signed)
Anesthesia Post Note  Patient: Victoria [name]  Procedure(s) Performed: ESOPHAGOGASTRODUODENOSCOPY (EGD) WITH PROPOFOL BIOPSY UPPER ENDOSCOPIC ULTRASOUND (EUS) LINEAR FINE NEEDLE ASPIRATION (FNA) LINEAR     Patient location during evaluation: Endoscopy Anesthesia Type: MAC Level of consciousness: oriented, awake and alert and awake Pain management: pain level controlled Vital Signs Assessment: post-procedure vital signs reviewed and stable Respiratory status: spontaneous breathing, nonlabored ventilation, respiratory function stable and patient connected to nasal cannula oxygen Cardiovascular status: blood pressure returned to baseline and stable Postop Assessment: no headache, no backache and no apparent nausea or vomiting Anesthetic complications: no   No notable events documented.  Last Vitals:  Vitals:   01/09/23 1650 01/09/23 1700  BP: 123/83 115/69  Pulse: 93 87  Resp: 20 17  Temp:    SpO2: 96% 91%    Last Pain:  Vitals:   01/09/23 1700  TempSrc:   PainSc: [name]

## 2023-01-09 NOTE — Anesthesia Preprocedure Evaluation (Addendum)
Anesthesia Evaluation  Patient identified by MRN, date of birth, ID band Patient awake    Reviewed: Allergy & Precautions, NPO status , Patient's Chart, lab work & pertinent test results  Airway Mallampati: II  TM Distance: >3 FB Neck ROM: Full    Dental  (+) Poor Dentition, Missing, Dental Advisory Given   Pulmonary PE   Pulmonary exam normal breath sounds clear to auscultation       Cardiovascular hypertension, Pt. on medications  Rhythm:Regular Rate:Normal     Neuro/Psych  PSYCHIATRIC DISORDERS Anxiety        GI/Hepatic negative GI ROS,,,(+) Cirrhosis       , Hepatitis -, C pancreatic mass and metastatic lymphadenopathy   Endo/Other  diabetes, Type 2, Oral Hypoglycemic Agents    Renal/GU negative Renal ROS     Musculoskeletal negative musculoskeletal ROS (+)    Abdominal   Peds  Hematology negative hematology ROS (+)   Anesthesia Other Findings Day of surgery medications reviewed with the patient.  H/o right breast cancer   Reproductive/Obstetrics                             Anesthesia Physical Anesthesia Plan  ASA: 4  Anesthesia Plan: MAC   Post-op Pain Management: Minimal or no pain anticipated   Induction: Intravenous  PONV Risk Score and Plan: 2 and TIVA, Treatment may vary due to age or medical condition and Propofol infusion  Airway Management Planned: Natural Airway and Simple Face Mask  Additional Equipment: None  Intra-op Plan:   Post-operative Plan:   Informed Consent: I have reviewed the patients History and Physical, chart, labs and discussed the procedure including the risks, benefits and alternatives for the proposed anesthesia with the patient or authorized representative who has indicated his/her understanding and acceptance.     Dental advisory given  Plan Discussed with: CRNA  Anesthesia Plan Comments:         Anesthesia Quick  Evaluation

## 2023-01-09 NOTE — Discharge Instructions (Addendum)
YOU HAD AN ENDOSCOPIC PROCEDURE TODAY: Refer to the procedure report and other information in the discharge instructions given to you for any specific questions about what was found during the examination. If this information does not answer your questions, please call [place] office at [phone] to clarify.   YOU SHOULD EXPECT: Some feelings of bloating in the abdomen. Passage of more gas than usual. Walking can help get rid of the air that was put into your GI tract during the procedure and reduce the bloating.  DIET: Your first meal following the procedure should be a light meal and then it is ok to progress to your normal diet. A half-sandwich or bowl of soup is an example of a good first meal. Heavy or fried foods are harder to digest and may make you feel nauseous or bloated. Drink plenty of fluids but you should avoid alcoholic beverages for 24 hours.   ACTIVITY: Your care partner should take you home directly after the procedure. You should plan to take it easy, moving slowly for the rest of the day. You can resume normal activity the day after the procedure however YOU SHOULD NOT DRIVE, use power tools, machinery or perform tasks that involve climbing or major physical exertion for 24 hours (because of the sedation medicines used during the test).   SYMPTOMS TO REPORT IMMEDIATELY: A gastroenterologist can be reached at any hour. Please call [phone]  for any of the following symptoms:    Following upper endoscopy (EGD, EUS, ERCP, esophageal dilation) Vomiting of blood or coffee ground material  New, significant abdominal pain  New, significant chest pain or pain under the shoulder blades  Painful or persistently difficult swallowing  New shortness of breath  Black, tarry-looking or red, bloody stools  FOLLOW UP:  If any biopsies were taken you will be contacted by phone or by letter within the next 1-3 weeks. Call [phone]  if you have not heard about the biopsies in 3 weeks.   Please also call with any specific questions about appointments or follow up tests.  You may restart your blood thinners in 2 days.

## 2023-01-09 NOTE — H&P (Signed)
GASTROENTEROLOGY PROCEDURE H&P NOTE   Primary Care Physician: Elby Showers, MD  HPI: Victoria [name] is a 82 y.o. female who presents for EGD/EUS to evaluate pancreatic abnormality with concern for possible malignancy.  Past Medical History:  Diagnosis Date   Abnormal finding on Pap smear    Allergy    SEASONAL   Anxiety    Breast cancer ([place]) 2000   right breast lumptectomy, radiation done   Cirrhosis ([place]) 11/2012   resolved per pt   Diabetes mellitus type 2, controlled ([place])    Hepatitis C    took tx for    Hypertension    Obesity    Thrombocytopenia ([place]) 11/2012   Past Surgical History:  Procedure Laterality Date   ANTERIOR AND POSTERIOR REPAIR N/A 02/28/2021   Procedure: ANTERIOR (CYSTOCELE) AND POSTERIOR REPAIR (RECTOCELE);  Surgeon: Cheri Fowler, MD;  Location: [facility];  Service: Gynecology;  Laterality: N/A;   BREAST BIOPSY Left 09/09/2016   BREAST LUMPECTOMY  right   BREAST LUMPECTOMY     BREAST REDUCTION SURGERY  2005   CATARACT EXTRACTION, BILATERAL  2014   CHOLECYSTECTOMY N/A 03/30/2013   Procedure: LAPAROSCOPIC CHOLECYSTECTOMY WITH INTRAOPERATIVE CHOLANGIOGRAM;  Surgeon: Shann Medal, MD;  Location: [facility];  Service: General;  Laterality: N/A;   COLONOSCOPY  2021   CYSTOSCOPY N/A 02/28/2021   Procedure: Consuela Mimes;  Surgeon: Cheri Fowler, MD;  Location: [place];  Service: Gynecology;  Laterality: N/A;   CYSTOSCOPY W/ RETROGRADES Left 02/28/2021   Procedure: CYSTOSCOPY WITH RETROGRADE PYELOGRAM;  Surgeon: Cheri Fowler, MD;  Location: [place];  Service: Gynecology;  Laterality: Left;   ESOPHAGOGASTRODUODENOSCOPY (EGD) WITH PROPOFOL N/A 11/25/2022   Procedure: ESOPHAGOGASTRODUODENOSCOPY (EGD) WITH PROPOFOL;  Surgeon: Carol Ada, MD;  Location: [place];  Service: Gastroenterology;  Laterality: N/A;   EUS  01/28/2013   Procedure: UPPER ENDOSCOPIC ULTRASOUND (EUS) LINEAR;  Surgeon:  Milus Banister, MD;  Location: [facility];  Service: Endoscopy;  Laterality: N/A;  lisseth brazeau [phone]   EUS Left 11/25/2022   Procedure: UPPER ENDOSCOPIC ULTRASOUND (EUS) LINEAR;  Surgeon: Carol Ada, MD;  Location: [place];  Service: Gastroenterology;  Laterality: Left;   EXCISION OF SKIN TAG Right 03/30/2013   Procedure: EXCISION OF SKIN TAG;  Surgeon: Shann Medal, MD;  Location: [facility];  Service: General;  Laterality: Right;   FINE NEEDLE ASPIRATION  11/25/2022   Procedure: FINE NEEDLE ASPIRATION (FNA) LINEAR;  Surgeon: Carol Ada, MD;  Location: [facility];  Service: Gastroenterology;;   LIVER BIOPSY  03/30/2013   Procedure: LIVER BIOPSY;  Surgeon: Shann Medal, MD;  Location: [facility];  Service: General;;   POLYPECTOMY     REDUCTION MAMMAPLASTY     THYROIDECTOMY, PARTIAL  1972   left   VAGINAL HYSTERECTOMY N/A 02/28/2021   Procedure: HYSTERECTOMY VAGINAL WITH UNILATERAL SALPINGECTOMY;  Surgeon: Cheri Fowler, MD;  Location: [place];  Service: Gynecology;  Laterality: N/A;   Current Facility-Administered Medications  Medication Dose Route Frequency Provider Last Rate Last Admin   0.9 %  sodium chloride infusion   Intravenous Continuous Mansouraty, Telford Nab., MD        Current Facility-Administered Medications:    0.9 %  sodium chloride infusion, , Intravenous, Continuous, Mansouraty, Telford Nab., MD Allergies  Allergen Reactions   Lisinopril Cough   Family History  Problem Relation Age of Onset   Diabetes Mother    Stroke Mother    Colon cancer  Sister 54   Colon polyps Neg Hx    Esophageal cancer Neg Hx    Rectal cancer Neg Hx    Stomach cancer Neg Hx    Pancreatic cancer Neg Hx    Prostate cancer Neg Hx    Breast cancer Neg Hx    Social History   Socioeconomic History   Marital status: Divorced    Spouse name: Not on file   Number of children: 3   Years of education: Not on file   Highest education level: Not on  file  Occupational History   Not on file  Tobacco Use   Smoking status: Never   Smokeless tobacco: Never  Vaping Use   Vaping Use: Never used  Substance and Sexual Activity   Alcohol use: No   Drug use: No   Sexual activity: Not Currently  Other Topics Concern   Not on file  Social History Narrative   Lives alone   Caffeine use: none   Right handed    Social Determinants of Health   Financial Resource Strain: Medium Risk (12/03/2021)   Overall Financial Resource Strain (CARDIA)    Difficulty of Paying Living Expenses: Somewhat hard  Food Insecurity: No Food Insecurity (11/22/2022)   Hunger Vital Sign    Worried About Running Out of Food in the Last Year: Never true    Ran Out of Food in the Last Year: Never true  Transportation Needs: No Transportation Needs (11/22/2022)   PRAPARE - Hydrologist (Medical): No    Lack of Transportation (Non-Medical): No  Physical Activity: Sufficiently Active (12/03/2021)   Exercise Vital Sign    Days of Exercise per Week: 4 days    Minutes of Exercise per Session: 60 min  Stress: No Stress Concern Present (12/03/2021)   [place]    Feeling of Stress : Only a little  Social Connections: Moderately Isolated (12/03/2021)   Social Connection and Isolation Panel [NHANES]    Frequency of Communication with Friends and Family: More than three times a week    Frequency of Social Gatherings with Friends and Family: More than three times a week    Attends Religious Services: Never    Marine scientist or Organizations: Yes    Attends Archivist Meetings: Never    Marital Status: Divorced  Human resources officer Violence: Not At Risk (11/22/2022)   Humiliation, Afraid, Rape, and Kick questionnaire    Fear of Current or Ex-Partner: No    Emotionally Abused: No    Physically Abused: No    Sexually Abused: No    Physical Exam: Today's Vitals    01/09/23 1427  BP: (!) 150/92  Pulse: (!) 105  Resp: 18  Temp: 98.2 F (36.8 C)  TempSrc: Temporal  SpO2: 93%  PainSc: 3    There is no height or weight on file to calculate BMI. GEN: NAD EYE: Sclerae anicteric ENT: MMM CV: Non-tachycardic GI: TTP in MEG surrounding flanks and going into back (5-6/10 pain currently)  NEURO:  Alert & Oriented x 3  Lab Results: No results for input(s): "WBC", "HGB", "HCT", "PLT" in the last 72 hours. BMET No results for input(s): "NA", "K", "CL", "CO2", "GLUCOSE", "BUN", "CREATININE", "CALCIUM" in the last 72 hours. LFT No results for input(s): "PROT", "ALBUMIN", "AST", "ALT", "ALKPHOS", "BILITOT", "BILIDIR", "IBILI" in the last 72 hours. PT/INR No results for input(s): "LABPROT", "INR" in the last 72 hours.  Impression / Plan: This is a 82 y.o.female who presents for EGD/EUS to evaluate pancreatic abnormality with concern for possible malignancy.  The risks and benefits of endoscopic evaluation/treatment were discussed with the patient and/or family; these include but are not limited to the risk of perforation, infection, bleeding, missed lesions, lack of diagnosis, severe illness requiring hospitalization, as well as anesthesia and sedation related illnesses.  The patient's history has been reviewed, patient examined, no change in status, and deemed stable for procedure.  The patient and/or family is agreeable to proceed.    Justice Britain, MD [facility] Office # [phone]

## 2023-01-09 NOTE — Transfer of Care (Signed)
Immediate Anesthesia Transfer of Care Note  Patient: Victoria [name]  Procedure(s) Performed: ESOPHAGOGASTRODUODENOSCOPY (EGD) WITH PROPOFOL BIOPSY UPPER ENDOSCOPIC ULTRASOUND (EUS) LINEAR FINE NEEDLE ASPIRATION (FNA) LINEAR  Patient Location: PACU  Anesthesia Type:MAC  Level of Consciousness: sedated  Airway & Oxygen Therapy: Patient Spontanous Breathing and Patient connected to face mask oxygen  Post-op Assessment: Report given to RN and Post -op Vital signs reviewed and stable  Post vital signs: Reviewed and stable  Last Vitals:  Vitals Value Taken Time  BP    Temp    Pulse 82 01/09/23 1642  Resp 25 01/09/23 1642  SpO2 100 % 01/09/23 1642  Vitals shown include unvalidated device data.  Last Pain:  Vitals:   01/09/23 1427  TempSrc: Temporal  PainSc: 3       Patients Stated Pain Goal: 0 (46/04/79 [id number])  Complications: No notable events documented.

## 2023-01-10 LAB — CANCER ANTIGEN 19-9: CA 19-9: 10 U/mL (ref 0–35)

## 2023-01-12 ENCOUNTER — Encounter (HOSPITAL_COMMUNITY): Payer: Self-pay | Admitting: Gastroenterology

## 2023-01-13 ENCOUNTER — Emergency Department (HOSPITAL_COMMUNITY): Payer: Medicare HMO

## 2023-01-13 ENCOUNTER — Other Ambulatory Visit: Payer: Self-pay

## 2023-01-13 ENCOUNTER — Inpatient Hospital Stay (HOSPITAL_COMMUNITY)
Admission: EM | Admit: 2023-01-13 | Discharge: 2023-01-20 | DRG: 194 | Disposition: A | Discharge status: Home / Self Care | Payer: Medicare HMO | Attending: Internal Medicine | Admitting: Internal Medicine

## 2023-01-13 DIAGNOSIS — Z823 Family history of stroke: Secondary | ICD-10-CM

## 2023-01-13 DIAGNOSIS — Z833 Family history of diabetes mellitus: Secondary | ICD-10-CM | POA: Diagnosis not present

## 2023-01-13 DIAGNOSIS — C259 Malignant neoplasm of pancreas, unspecified: Secondary | ICD-10-CM | POA: Diagnosis present

## 2023-01-13 DIAGNOSIS — F419 Anxiety disorder, unspecified: Secondary | ICD-10-CM | POA: Diagnosis present

## 2023-01-13 DIAGNOSIS — D6959 Other secondary thrombocytopenia: Secondary | ICD-10-CM | POA: Diagnosis present

## 2023-01-13 DIAGNOSIS — C799 Secondary malignant neoplasm of unspecified site: Secondary | ICD-10-CM | POA: Diagnosis not present

## 2023-01-13 DIAGNOSIS — Z800 Family history of malignant neoplasm of digestive organs: Secondary | ICD-10-CM

## 2023-01-13 DIAGNOSIS — R000 Tachycardia, unspecified: Secondary | ICD-10-CM | POA: Diagnosis not present

## 2023-01-13 DIAGNOSIS — K869 Disease of pancreas, unspecified: Secondary | ICD-10-CM | POA: Diagnosis not present

## 2023-01-13 DIAGNOSIS — D500 Iron deficiency anemia secondary to blood loss (chronic): Secondary | ICD-10-CM | POA: Diagnosis not present

## 2023-01-13 DIAGNOSIS — R040 Epistaxis: Secondary | ICD-10-CM | POA: Diagnosis present

## 2023-01-13 DIAGNOSIS — E119 Type 2 diabetes mellitus without complications: Secondary | ICD-10-CM

## 2023-01-13 DIAGNOSIS — Z7901 Long term (current) use of anticoagulants: Secondary | ICD-10-CM

## 2023-01-13 DIAGNOSIS — D6832 Hemorrhagic disorder due to extrinsic circulating anticoagulants: Secondary | ICD-10-CM | POA: Diagnosis not present

## 2023-01-13 DIAGNOSIS — Z7984 Long term (current) use of oral hypoglycemic drugs: Secondary | ICD-10-CM | POA: Diagnosis not present

## 2023-01-13 DIAGNOSIS — K7460 Unspecified cirrhosis of liver: Secondary | ICD-10-CM | POA: Diagnosis not present

## 2023-01-13 DIAGNOSIS — J188 Other pneumonia, unspecified organism: Secondary | ICD-10-CM | POA: Diagnosis present

## 2023-01-13 DIAGNOSIS — Z888 Allergy status to other drugs, medicaments and biological substances status: Secondary | ICD-10-CM

## 2023-01-13 DIAGNOSIS — R042 Hemoptysis: Secondary | ICD-10-CM | POA: Diagnosis present

## 2023-01-13 DIAGNOSIS — I82B12 Acute embolism and thrombosis of left subclavian vein: Secondary | ICD-10-CM | POA: Diagnosis present

## 2023-01-13 DIAGNOSIS — Z853 Personal history of malignant neoplasm of breast: Secondary | ICD-10-CM

## 2023-01-13 DIAGNOSIS — G893 Neoplasm related pain (acute) (chronic): Secondary | ICD-10-CM | POA: Diagnosis present

## 2023-01-13 DIAGNOSIS — I10 Essential (primary) hypertension: Secondary | ICD-10-CM | POA: Diagnosis present

## 2023-01-13 DIAGNOSIS — R1013 Epigastric pain: Secondary | ICD-10-CM | POA: Diagnosis not present

## 2023-01-13 DIAGNOSIS — C779 Secondary and unspecified malignant neoplasm of lymph node, unspecified: Secondary | ICD-10-CM | POA: Diagnosis present

## 2023-01-13 DIAGNOSIS — C250 Malignant neoplasm of head of pancreas: Secondary | ICD-10-CM | POA: Diagnosis not present

## 2023-01-13 DIAGNOSIS — I2699 Other pulmonary embolism without acute cor pulmonale: Secondary | ICD-10-CM | POA: Diagnosis present

## 2023-01-13 DIAGNOSIS — R109 Unspecified abdominal pain: Secondary | ICD-10-CM | POA: Diagnosis not present

## 2023-01-13 DIAGNOSIS — Z9049 Acquired absence of other specified parts of digestive tract: Secondary | ICD-10-CM

## 2023-01-13 DIAGNOSIS — T40605A Adverse effect of unspecified narcotics, initial encounter: Secondary | ICD-10-CM | POA: Diagnosis present

## 2023-01-13 DIAGNOSIS — B182 Chronic viral hepatitis C: Secondary | ICD-10-CM | POA: Diagnosis present

## 2023-01-13 DIAGNOSIS — K5903 Drug induced constipation: Secondary | ICD-10-CM | POA: Diagnosis present

## 2023-01-13 DIAGNOSIS — Z515 Encounter for palliative care: Secondary | ICD-10-CM | POA: Diagnosis not present

## 2023-01-13 DIAGNOSIS — D62 Acute posthemorrhagic anemia: Secondary | ICD-10-CM | POA: Diagnosis present

## 2023-01-13 DIAGNOSIS — K5730 Diverticulosis of large intestine without perforation or abscess without bleeding: Secondary | ICD-10-CM | POA: Diagnosis present

## 2023-01-13 DIAGNOSIS — Z79899 Other long term (current) drug therapy: Secondary | ICD-10-CM | POA: Diagnosis not present

## 2023-01-13 DIAGNOSIS — C7801 Secondary malignant neoplasm of right lung: Secondary | ICD-10-CM | POA: Diagnosis not present

## 2023-01-13 DIAGNOSIS — J189 Pneumonia, unspecified organism: Principal | ICD-10-CM | POA: Diagnosis present

## 2023-01-13 DIAGNOSIS — C7802 Secondary malignant neoplasm of left lung: Secondary | ICD-10-CM | POA: Diagnosis not present

## 2023-01-13 DIAGNOSIS — K648 Other hemorrhoids: Secondary | ICD-10-CM | POA: Diagnosis present

## 2023-01-13 DIAGNOSIS — Z86711 Personal history of pulmonary embolism: Secondary | ICD-10-CM

## 2023-01-13 DIAGNOSIS — R0902 Hypoxemia: Secondary | ICD-10-CM | POA: Diagnosis not present

## 2023-01-13 DIAGNOSIS — B1920 Unspecified viral hepatitis C without hepatic coma: Secondary | ICD-10-CM | POA: Diagnosis not present

## 2023-01-13 DIAGNOSIS — D696 Thrombocytopenia, unspecified: Secondary | ICD-10-CM | POA: Diagnosis present

## 2023-01-13 DIAGNOSIS — T45515A Adverse effect of anticoagulants, initial encounter: Secondary | ICD-10-CM | POA: Diagnosis present

## 2023-01-13 DIAGNOSIS — Z1152 Encounter for screening for COVID-19: Secondary | ICD-10-CM

## 2023-01-13 DIAGNOSIS — I82A12 Acute embolism and thrombosis of left axillary vein: Secondary | ICD-10-CM | POA: Diagnosis not present

## 2023-01-13 DIAGNOSIS — D649 Anemia, unspecified: Principal | ICD-10-CM

## 2023-01-13 DIAGNOSIS — R918 Other nonspecific abnormal finding of lung field: Secondary | ICD-10-CM | POA: Diagnosis not present

## 2023-01-13 DIAGNOSIS — Z7189 Other specified counseling: Secondary | ICD-10-CM | POA: Diagnosis not present

## 2023-01-13 DIAGNOSIS — K8689 Other specified diseases of pancreas: Secondary | ICD-10-CM | POA: Diagnosis not present

## 2023-01-13 DIAGNOSIS — J168 Pneumonia due to other specified infectious organisms: Secondary | ICD-10-CM | POA: Diagnosis not present

## 2023-01-13 DIAGNOSIS — R609 Edema, unspecified: Secondary | ICD-10-CM | POA: Diagnosis not present

## 2023-01-13 LAB — COMPREHENSIVE METABOLIC PANEL
ALT: 25 U/L (ref 0–44)
AST: 37 U/L (ref 15–41)
Albumin: 3.5 g/dL (ref 3.5–5.0)
Alkaline Phosphatase: 80 U/L (ref 38–126)
Anion gap: 14 (ref 5–15)
BUN: 13 mg/dL (ref 8–23)
CO2: 23 mmol/L (ref 22–32)
Calcium: 8.8 mg/dL — ABNORMAL LOW (ref 8.9–10.3)
Chloride: 98 mmol/L (ref 98–111)
Creatinine, Ser: 1.07 mg/dL — ABNORMAL HIGH (ref 0.44–1.00)
GFR, Estimated: 53 mL/min — ABNORMAL LOW (ref >60)
Glucose, Bld: 193 mg/dL — ABNORMAL HIGH (ref 70–99)
Potassium: 3.4 mmol/L — ABNORMAL LOW (ref 3.5–5.1)
Sodium: 135 mmol/L (ref 135–145)
Total Bilirubin: 0.7 mg/dL (ref 0.3–1.2)
Total Protein: 7.2 g/dL (ref 6.5–8.1)

## 2023-01-13 LAB — CBC WITH DIFFERENTIAL/PLATELET
Abs Immature Granulocytes: 0.05 10*3/uL (ref 0.00–0.07)
Basophils Absolute: 0 10*3/uL (ref 0.0–0.1)
Basophils Relative: 0 %
Eosinophils Absolute: 0.1 10*3/uL (ref 0.0–0.5)
Eosinophils Relative: 1 %
HCT: 28.2 % — ABNORMAL LOW (ref 36.0–46.0)
Hemoglobin: 9.3 g/dL — ABNORMAL LOW (ref 12.0–15.0)
Immature Granulocytes: 1 %
Lymphocytes Relative: 16 %
Lymphs Abs: 1.5 10*3/uL (ref 0.7–4.0)
MCH: 27.8 pg (ref 26.0–34.0)
MCHC: 33 g/dL (ref 30.0–36.0)
MCV: 84.4 fL (ref 80.0–100.0)
Monocytes Absolute: 0.7 10*3/uL (ref 0.1–1.0)
Monocytes Relative: 8 %
Neutro Abs: 7.1 10*3/uL (ref 1.7–7.7)
Neutrophils Relative %: 74 %
Platelets: 44 10*3/uL — ABNORMAL LOW (ref 150–400)
RBC: 3.34 MIL/uL — ABNORMAL LOW (ref 3.87–5.11)
RDW: 16.5 % — ABNORMAL HIGH (ref 11.5–15.5)
WBC: 9.5 10*3/uL (ref 4.0–10.5)
nRBC: 0 % (ref 0.0–0.2)

## 2023-01-13 LAB — CBC
HCT: 26.8 % — ABNORMAL LOW (ref 36.0–46.0)
Hemoglobin: 8.9 g/dL — ABNORMAL LOW (ref 12.0–15.0)
MCH: 27.7 pg (ref 26.0–34.0)
MCHC: 33.2 g/dL (ref 30.0–36.0)
MCV: 83.5 fL (ref 80.0–100.0)
Platelets: 40 10*3/uL — ABNORMAL LOW (ref 150–400)
RBC: 3.21 MIL/uL — ABNORMAL LOW (ref 3.87–5.11)
RDW: 16.3 % — ABNORMAL HIGH (ref 11.5–15.5)
WBC: 8.6 10*3/uL (ref 4.0–10.5)
nRBC: 0 % (ref 0.0–0.2)

## 2023-01-13 LAB — RESP PANEL BY RT-PCR (RSV, FLU A&B, COVID)  RVPGX2
Influenza A by PCR: NEGATIVE
Influenza B by PCR: NEGATIVE
Resp Syncytial Virus by PCR: NEGATIVE
SARS Coronavirus 2 by RT PCR: NEGATIVE

## 2023-01-13 LAB — URINALYSIS, ROUTINE W REFLEX MICROSCOPIC
Bacteria, UA: NONE SEEN
Bilirubin Urine: NEGATIVE
Glucose, UA: NEGATIVE mg/dL
Hgb urine dipstick: NEGATIVE
Ketones, ur: NEGATIVE mg/dL
Leukocytes,Ua: NEGATIVE
Nitrite: NEGATIVE
Protein, ur: 30 mg/dL — AB
Specific Gravity, Urine: 1.016 (ref 1.005–1.030)
pH: 6 (ref 5.0–8.0)

## 2023-01-13 LAB — LIPASE, BLOOD: Lipase: 27 U/L (ref 11–51)

## 2023-01-13 LAB — TROPONIN I (HIGH SENSITIVITY): Troponin I (High Sensitivity): 6 ng/L

## 2023-01-13 LAB — CBG MONITORING, ED: Glucose-Capillary: 199 mg/dL — ABNORMAL HIGH (ref 70–99)

## 2023-01-13 MED ORDER — HYDROMORPHONE HCL 1 MG/ML IJ SOLN
1.0000 mg | Freq: Once | INTRAMUSCULAR | Status: AC
  Administered 2023-01-13: 1 mg via INTRAVENOUS
  Filled 2023-01-13: qty 1

## 2023-01-13 MED ORDER — HYDROMORPHONE HCL 1 MG/ML IJ SOLN: 1.0000 mg | Freq: Once | INTRAMUSCULAR | Status: DC

## 2023-01-13 MED ORDER — OXYCODONE-ACETAMINOPHEN 5-325 MG PO TABS
1.0000 | ORAL_TABLET | Freq: Once | ORAL | Status: AC
  Administered 2023-01-13: 1 via ORAL
  Filled 2023-01-13: qty 1

## 2023-01-13 MED ORDER — IOHEXOL 350 MG/ML SOLN
100.0000 mL | Freq: Once | INTRAVENOUS | Status: AC | PRN
  Administered 2023-01-13: 100 mL via INTRAVENOUS

## 2023-01-13 NOTE — ED Provider Triage Note (Signed)
Emergency Medicine Provider Triage Evaluation Note  Victoria [name] , a 82 y.o. female  was evaluated in triage.  Patient has a history of a suspicious pancreatic mass that was recently biopsied.  She says that she is coming in with severe abdominal pain over the past 2 days.  Also says that she is coughing or spitting up some type of blood.  Says that she is also having some chest pain and shortness of breath but she thinks the shortness of breath is secondary to pain in her abdomen when her diaphragm expands.  Patient has a history of blood clots.  She stopped her blood thinner for 2 days for an inpatient procedure and wonders if she has another clot.  Review of Systems  Positive:  Negative:  Physical Exam  BP (!) 142/89 (BP Location: Left Arm)   Pulse (!) 114   Temp 98.5 F (36.9 C) (Oral)   Resp 18   Ht '5\' 8"'$  (1.727 m)   SpO2 97%   BMI 28.13 kg/m  Gen:   Awake, no distress   Resp:  Normal effort  MSK:   Moves extremities without difficulty  Other:  Tachycardic, regular rhythm, uncomfortable in appearance but stable  Medical Decision Making  Medically screening exam initiated at 8:46 PM.  Appropriate orders placed.  Victoria [name] was informed that the remainder of the evaluation will be completed by another provider, this initial triage assessment does not replace that evaluation, and the importance of remaining in the ED until their evaluation is complete.     Victoria [name], [place] 01/13/23 2047

## 2023-01-13 NOTE — ED Triage Notes (Addendum)
Pt arrives with reports of coughing up blood. Pt reports recent blood clot in December and came off of blood thinner for two days last week for her endoscopy on Wednesday. Pt reports abdominal pain as well due to issues with pancreas. Pt reports SHOB and emesis as well. Pt reports feeling dizzy. Pt restarted taking blood thinner on Friday.

## 2023-01-13 NOTE — ED Provider Notes (Signed)
[place] EMERGENCY DEPARTMENT AT [facility] Provider Note   CSN: [id number] Arrival date & time: 01/13/23  1914     History  Chief Complaint  Patient presents with   Abdominal Pain   Shortness of Breath    Victoria [name] is a 82 y.o. female.   Abdominal Pain Associated symptoms: shortness of breath   Shortness of Breath Associated symptoms: abdominal pain   Patient Victoria [name] with a few different complaints.  Nosebleeds.  Mostly on right side.  States she has had some clots passed.  Also will cough up some blood.  Does feel short of breath and some left-sided chest pain.  Previous pulm embolisms on anticoagulation.  Had to miss a few days due to biopsy by endoscopy of her pancreatic mass.  Also increasing abdominal pain.  Anterior abdomen.  Worse than her baseline pain.  States that she has been on Eliquis she has had more bleeding also.  More bruising.    Past Medical History:  Diagnosis Date   Abnormal finding on Pap smear    Allergy    SEASONAL   Anxiety    Breast cancer ([place]) 2000   right breast lumptectomy, radiation done   Cirrhosis ([place]) 11/2012   resolved per pt   Diabetes mellitus type 2, controlled ([place])    Hepatitis C    took tx for    Hypertension    Obesity    Thrombocytopenia ([place]) 11/2012    Home Medications Prior to Admission medications   Medication Sig Start Date End Date Taking? Authorizing Provider  albuterol (VENTOLIN HFA) 108 (90 Base) MCG/ACT inhaler Inhale 2 puffs into the lungs every 6 (six) hours as needed for wheezing or shortness of breath. 08/15/22   Baxley, Cresenciano Lick, MD  ALPRAZolam [name]) 0.5 MG tablet TAKE 1 TABLET(0.5 MG) BY MOUTH TWICE DAILY AS NEEDED FOR ANXIETY 08/12/22   Elby Showers, MD  amLODipine (NORVASC) 2.5 MG tablet TAKE 1 TABLET(2.5 MG) BY MOUTH AT BEDTIME 08/04/22   Elby Showers, MD  apixaban (ELIQUIS) 5 MG TABS tablet Take 1 tablet (5 mg total) by mouth 2 (two) times daily. 01/11/23   Mansouraty, Telford Nab.,  MD  Blood Glucose Monitoring Suppl (TRUE METRIX METER) w/Device KIT USE AS DIRECTED 07/30/21   Elby Showers, MD  cetirizine (ZYRTEC) 10 MG tablet Take 10 mg by mouth daily as needed (allergies).    [provider]  cholecalciferol (VITAMIN D3) 25 MCG (1000 UNIT) tablet Take 1,000 Units by mouth in the morning.    [provider]  docusate sodium (COLACE) 100 MG capsule Take 1 capsule (100 mg total) by mouth daily. Patient taking differently: Take 100 mg by mouth every 6 (six) hours as needed (with norco for constipation prevention.). 12/27/22   Dorie Rank, MD  glucose blood (TRUE METRIX BLOOD GLUCOSE TEST) test strip TEST BLOOD SUGAR TWICE DAILY 10/10/22   Elby Showers, MD  HYDROcodone-acetaminophen (NORCO/VICODIN) 5-325 MG tablet Take 1 tablet by mouth every 6 (six) hours as needed. 12/27/22   Dorie Rank, MD  ibuprofen (ADVIL) 200 MG tablet Take 200-400 mg by mouth every 8 (eight) hours as needed (pain.).    [provider]  Lancets (FREESTYLE) lancets CHECK GLUCOSE TWICE A DAY 01/26/20   Elby Showers, MD  loperamide (IMODIUM) 2 MG capsule Take 2-4 mg by mouth 3 (three) times daily as needed for diarrhea or loose stools.    [provider]  losartan-hydrochlorothiazide (HYZAAR) 100-25 MG  tablet Take 1 tablet by mouth daily. 12/03/21   Elby Showers, MD  metFORMIN (GLUCOPHAGE) 500 MG tablet TAKE 1 TABLET(500 MG) BY MOUTH TWICE DAILY WITH A MEAL 11/20/22   Elby Showers, MD  Multiple Vitamin (MULTIVITAMIN WITH MINERALS) TABS tablet Take 1 tablet by mouth in the morning.    [provider]  pantoprazole (PROTONIX) 40 MG tablet Take 1 tablet (40 mg total) by mouth daily. 12/25/22   Elby Showers, MD      Allergies    Lisinopril    Review of Systems   Review of Systems  Respiratory:  Positive for shortness of breath.   Gastrointestinal:  Positive for abdominal pain.    Physical Exam Updated Vital Signs BP 135/82   Pulse 95   Temp 98.5 F (36.9 C)  (Oral)   Resp 12   Ht '5\' 8"'$  (1.727 m)   SpO2 93%   BMI 28.13 kg/m  Physical Exam Vitals and nursing note reviewed.  HENT:     Head: Atraumatic.  Cardiovascular:     Rate and Rhythm: Tachycardia present.  Pulmonary:     Breath sounds: Normal breath sounds.     Comments: Tenderness to left anterior chest wall. Chest:     Chest wall: Tenderness present.  Abdominal:     Tenderness: There is abdominal tenderness.     Hernia: No hernia is present.     Comments: Tenderness to upper abdomen.  Also less tenderness to lower abdomen.  No ecchymosis.  Neurological:     Mental Status: She is alert.   Does have some dried blood in the naris.  No active bleeding. Does have some mild swelling under left mandible.  States it is salivary gland has been on Cipro for it.  ED Results / Procedures / Treatments   Labs (all labs ordered are listed, but only abnormal results are displayed) Labs Reviewed  CBC WITH DIFFERENTIAL/PLATELET - Abnormal; Notable for the following components:      Result Value   RBC 3.34 (*)    Hemoglobin 9.3 (*)    HCT 28.2 (*)    RDW 16.5 (*)    Platelets 44 (*)    All other components within normal limits  COMPREHENSIVE METABOLIC PANEL - Abnormal; Notable for the following components:   Potassium 3.4 (*)    Glucose, Bld 193 (*)    Creatinine, Ser 1.07 (*)    Calcium 8.8 (*)    GFR, Estimated 53 (*)    All other components within normal limits  URINALYSIS, ROUTINE W REFLEX MICROSCOPIC - Abnormal; Notable for the following components:   Protein, ur 30 (*)    All other components within normal limits  CBC - Abnormal; Notable for the following components:   RBC 3.21 (*)    Hemoglobin 8.9 (*)    HCT 26.8 (*)    RDW 16.3 (*)    Platelets 40 (*)    All other components within normal limits  CBG MONITORING, ED - Abnormal; Notable for the following components:   Glucose-Capillary 199 (*)    All other components within normal limits  RESP PANEL BY RT-PCR (RSV, FLU  A&B, COVID)  RVPGX2  LIPASE, BLOOD  IMMATURE PLATELET FRACTION  TROPONIN I (HIGH SENSITIVITY)    EKG EKG Interpretation  Date/Time:  Monday January 13 2023 21:39:48 EST Ventricular Rate:  104 PR Interval:  157 QRS Duration: 82 QT Interval:  327 QTC Calculation: 431 R Axis:   10 Text Interpretation: Sinus  tachycardia Probable left atrial enlargement Borderline low voltage, extremity leads Consider anterior infarct Confirmed by Davonna Belling [phone]) on 01/13/2023 10:53:07 PM  Radiology No results found.  Procedures Procedures    Medications Ordered in ED Medications  oxyCODONE-acetaminophen (PERCOCET/ROXICET) 5-325 MG per tablet 1 tablet (1 tablet Oral Given 01/13/23 2154)  HYDROmorphone (DILAUDID) injection 1 mg (1 mg Intravenous Given 01/13/23 2155)  iohexol (OMNIPAQUE) 350 MG/ML injection 100 mL (100 mLs Intravenous Contrast Given 01/13/23 2302)    ED Course/ Medical Decision Making/ A&P                             Medical Decision Making Amount and/or Complexity of Data Reviewed Labs: ordered.  Risk Prescription drug management.   Patient with few different complaints.  Epistaxis.  States she does not want the packing in her nose again.  Do not see active bleeding at this time.  However also cough shortness of breath left side abdominal pain with recent pulm embolism.  Has been off blood thinners for couple days due to have procedure.  Will get CT angiography to evaluate.  Also with worsening abdominal pain after endoscopic biopsy of the mass.  Will get CT scan to further evaluate.  Reviewed notes and also reviewed recent CT imaging 17 days ago of her abdomen.  Also reviewed previous MRCP.  However blood work does show an anemia.  Has had both nosebleeding coughing up some blood.  Also recent pancreatic biopsy.  Also new thrombocytopenia with platelets of 44.  Will recheck for validity however with bleeding bruising would likely require admission to the hospital if it  is accurate.  Still will get CT scan of abdomen pelvis and chest.   Care turned over to Dr. Randal Buba        Final Clinical Impression(s) / ED Diagnoses Final diagnoses:  Anemia, unspecified type  Thrombocytopenia [facility])    Rx / DC Orders ED Discharge Orders     None         Davonna Belling, MD 01/13/23 2328

## 2023-01-14 DIAGNOSIS — Z833 Family history of diabetes mellitus: Secondary | ICD-10-CM | POA: Diagnosis not present

## 2023-01-14 DIAGNOSIS — I82A12 Acute embolism and thrombosis of left axillary vein: Secondary | ICD-10-CM | POA: Diagnosis present

## 2023-01-14 DIAGNOSIS — F419 Anxiety disorder, unspecified: Secondary | ICD-10-CM | POA: Diagnosis present

## 2023-01-14 DIAGNOSIS — R609 Edema, unspecified: Secondary | ICD-10-CM | POA: Diagnosis not present

## 2023-01-14 DIAGNOSIS — R1013 Epigastric pain: Secondary | ICD-10-CM

## 2023-01-14 DIAGNOSIS — R0902 Hypoxemia: Secondary | ICD-10-CM | POA: Diagnosis not present

## 2023-01-14 DIAGNOSIS — D696 Thrombocytopenia, unspecified: Secondary | ICD-10-CM

## 2023-01-14 DIAGNOSIS — C7801 Secondary malignant neoplasm of right lung: Secondary | ICD-10-CM | POA: Diagnosis present

## 2023-01-14 DIAGNOSIS — I10 Essential (primary) hypertension: Secondary | ICD-10-CM | POA: Diagnosis present

## 2023-01-14 DIAGNOSIS — C7802 Secondary malignant neoplasm of left lung: Secondary | ICD-10-CM | POA: Diagnosis present

## 2023-01-14 DIAGNOSIS — K7460 Unspecified cirrhosis of liver: Secondary | ICD-10-CM

## 2023-01-14 DIAGNOSIS — K869 Disease of pancreas, unspecified: Secondary | ICD-10-CM

## 2023-01-14 DIAGNOSIS — Z79899 Other long term (current) drug therapy: Secondary | ICD-10-CM | POA: Diagnosis not present

## 2023-01-14 DIAGNOSIS — D649 Anemia, unspecified: Secondary | ICD-10-CM | POA: Diagnosis not present

## 2023-01-14 DIAGNOSIS — B1920 Unspecified viral hepatitis C without hepatic coma: Secondary | ICD-10-CM | POA: Diagnosis not present

## 2023-01-14 DIAGNOSIS — Z7984 Long term (current) use of oral hypoglycemic drugs: Secondary | ICD-10-CM | POA: Diagnosis not present

## 2023-01-14 DIAGNOSIS — J189 Pneumonia, unspecified organism: Secondary | ICD-10-CM | POA: Diagnosis present

## 2023-01-14 DIAGNOSIS — C259 Malignant neoplasm of pancreas, unspecified: Secondary | ICD-10-CM | POA: Diagnosis present

## 2023-01-14 DIAGNOSIS — I82B12 Acute embolism and thrombosis of left subclavian vein: Secondary | ICD-10-CM | POA: Diagnosis present

## 2023-01-14 DIAGNOSIS — C250 Malignant neoplasm of head of pancreas: Secondary | ICD-10-CM

## 2023-01-14 DIAGNOSIS — D6959 Other secondary thrombocytopenia: Secondary | ICD-10-CM

## 2023-01-14 DIAGNOSIS — R109 Unspecified abdominal pain: Secondary | ICD-10-CM | POA: Diagnosis present

## 2023-01-14 DIAGNOSIS — B182 Chronic viral hepatitis C: Secondary | ICD-10-CM | POA: Diagnosis present

## 2023-01-14 DIAGNOSIS — D500 Iron deficiency anemia secondary to blood loss (chronic): Secondary | ICD-10-CM

## 2023-01-14 DIAGNOSIS — R040 Epistaxis: Secondary | ICD-10-CM | POA: Diagnosis not present

## 2023-01-14 DIAGNOSIS — Z7901 Long term (current) use of anticoagulants: Secondary | ICD-10-CM

## 2023-01-14 DIAGNOSIS — K8689 Other specified diseases of pancreas: Secondary | ICD-10-CM | POA: Diagnosis not present

## 2023-01-14 DIAGNOSIS — G893 Neoplasm related pain (acute) (chronic): Secondary | ICD-10-CM | POA: Diagnosis present

## 2023-01-14 DIAGNOSIS — Z853 Personal history of malignant neoplasm of breast: Secondary | ICD-10-CM | POA: Diagnosis not present

## 2023-01-14 DIAGNOSIS — C779 Secondary and unspecified malignant neoplasm of lymph node, unspecified: Secondary | ICD-10-CM | POA: Diagnosis present

## 2023-01-14 DIAGNOSIS — E119 Type 2 diabetes mellitus without complications: Secondary | ICD-10-CM | POA: Diagnosis present

## 2023-01-14 DIAGNOSIS — R042 Hemoptysis: Secondary | ICD-10-CM | POA: Diagnosis present

## 2023-01-14 DIAGNOSIS — Z1152 Encounter for screening for COVID-19: Secondary | ICD-10-CM | POA: Diagnosis not present

## 2023-01-14 DIAGNOSIS — D62 Acute posthemorrhagic anemia: Secondary | ICD-10-CM | POA: Diagnosis present

## 2023-01-14 DIAGNOSIS — Z86711 Personal history of pulmonary embolism: Secondary | ICD-10-CM | POA: Diagnosis not present

## 2023-01-14 DIAGNOSIS — R918 Other nonspecific abnormal finding of lung field: Secondary | ICD-10-CM

## 2023-01-14 DIAGNOSIS — D6832 Hemorrhagic disorder due to extrinsic circulating anticoagulants: Secondary | ICD-10-CM | POA: Diagnosis not present

## 2023-01-14 LAB — CBG MONITORING, ED: Glucose-Capillary: 135 mg/dL — ABNORMAL HIGH (ref 70–99)

## 2023-01-14 LAB — IMMATURE PLATELET FRACTION: Immature Platelet Fraction: 9.4 % — ABNORMAL HIGH (ref 1.2–8.6)

## 2023-01-14 LAB — POC OCCULT BLOOD, ED: Fecal Occult Bld: NEGATIVE

## 2023-01-14 LAB — GLUCOSE, CAPILLARY
Glucose-Capillary: 148 mg/dL — ABNORMAL HIGH (ref 70–99)
Glucose-Capillary: 149 mg/dL — ABNORMAL HIGH (ref 70–99)

## 2023-01-14 LAB — CYTOLOGY - NON PAP

## 2023-01-14 MED ORDER — HYDROCODONE-ACETAMINOPHEN 5-325 MG PO TABS
1.0000 | ORAL_TABLET | Freq: Four times a day (QID) | ORAL | Status: DC | PRN
  Administered 2023-01-14 (×2): 1 via ORAL
  Filled 2023-01-14 (×2): qty 1

## 2023-01-14 MED ORDER — DOCUSATE SODIUM 100 MG PO CAPS
100.0000 mg | ORAL_CAPSULE | Freq: Every day | ORAL | Status: DC
  Administered 2023-01-14 – 2023-01-16 (×3): 100 mg via ORAL
  Filled 2023-01-14 (×3): qty 1

## 2023-01-14 MED ORDER — ADULT MULTIVITAMIN W/MINERALS CH
1.0000 | ORAL_TABLET | Freq: Every morning | ORAL | Status: DC
  Administered 2023-01-14 – 2023-01-20 (×7): 1 via ORAL
  Filled 2023-01-14 (×7): qty 1

## 2023-01-14 MED ORDER — ACETAMINOPHEN 650 MG RE SUPP: 650.0000 mg | Freq: Four times a day (QID) | RECTAL | Status: DC | PRN

## 2023-01-14 MED ORDER — PANTOPRAZOLE SODIUM 40 MG PO TBEC
40.0000 mg | DELAYED_RELEASE_TABLET | Freq: Every day | ORAL | Status: DC
  Administered 2023-01-14 – 2023-01-20 (×7): 40 mg via ORAL
  Filled 2023-01-14 (×7): qty 1

## 2023-01-14 MED ORDER — SODIUM CHLORIDE 0.9 % IV SOLN
1.0000 g | INTRAVENOUS | Status: DC
  Administered 2023-01-15 – 2023-01-18 (×4): 1 g via INTRAVENOUS
  Filled 2023-01-14 (×4): qty 10

## 2023-01-14 MED ORDER — ALBUTEROL SULFATE (2.5 MG/3ML) 0.083% IN NEBU: 2.5000 mg | INHALATION_SOLUTION | Freq: Four times a day (QID) | RESPIRATORY_TRACT | Status: DC | PRN

## 2023-01-14 MED ORDER — ALBUTEROL SULFATE HFA 108 (90 BASE) MCG/ACT IN AERS: 2.0000 | INHALATION_SPRAY | Freq: Four times a day (QID) | RESPIRATORY_TRACT | Status: DC | PRN

## 2023-01-14 MED ORDER — ALPRAZOLAM 0.5 MG PO TABS
0.5000 mg | ORAL_TABLET | Freq: Two times a day (BID) | ORAL | Status: DC
  Administered 2023-01-14 – 2023-01-20 (×13): 0.5 mg via ORAL
  Filled 2023-01-14 (×13): qty 1

## 2023-01-14 MED ORDER — KETOROLAC TROMETHAMINE 30 MG/ML IJ SOLN
15.0000 mg | Freq: Once | INTRAMUSCULAR | Status: AC
  Administered 2023-01-14: 15 mg via INTRAVENOUS
  Filled 2023-01-14: qty 1

## 2023-01-14 MED ORDER — ONDANSETRON HCL 4 MG/2ML IJ SOLN: 4.0000 mg | Freq: Four times a day (QID) | INTRAMUSCULAR | Status: DC | PRN

## 2023-01-14 MED ORDER — SODIUM CHLORIDE 0.9 % IV SOLN
500.0000 mg | INTRAVENOUS | Status: DC
  Administered 2023-01-15 – 2023-01-18 (×4): 500 mg via INTRAVENOUS
  Filled 2023-01-14 (×4): qty 5

## 2023-01-14 MED ORDER — SODIUM CHLORIDE 0.9 % IV SOLN
1.0000 g | Freq: Once | INTRAVENOUS | Status: AC
  Administered 2023-01-14: 1 g via INTRAVENOUS
  Filled 2023-01-14: qty 10

## 2023-01-14 MED ORDER — POTASSIUM CHLORIDE CRYS ER 20 MEQ PO TBCR
40.0000 meq | EXTENDED_RELEASE_TABLET | Freq: Once | ORAL | Status: AC
  Administered 2023-01-14: 40 meq via ORAL
  Filled 2023-01-14: qty 2

## 2023-01-14 MED ORDER — HYDROCODONE-ACETAMINOPHEN 5-325 MG PO TABS
1.0000 | ORAL_TABLET | Freq: Four times a day (QID) | ORAL | Status: DC | PRN
  Administered 2023-01-14 – 2023-01-15 (×2): 2 via ORAL
  Filled 2023-01-14 (×3): qty 2

## 2023-01-14 MED ORDER — ONDANSETRON HCL 4 MG PO TABS: 4.0000 mg | ORAL_TABLET | Freq: Four times a day (QID) | ORAL | Status: DC | PRN

## 2023-01-14 MED ORDER — PANTOPRAZOLE SODIUM 40 MG PO TBEC: 40.0000 mg | DELAYED_RELEASE_TABLET | Freq: Every day | ORAL | Status: DC

## 2023-01-14 MED ORDER — MORPHINE SULFATE (PF) 2 MG/ML IV SOLN
1.0000 mg | INTRAVENOUS | Status: DC | PRN
  Administered 2023-01-14: 1 mg via INTRAVENOUS
  Filled 2023-01-14: qty 1

## 2023-01-14 MED ORDER — SODIUM CHLORIDE 0.9 % IV SOLN
500.0000 mg | Freq: Once | INTRAVENOUS | Status: AC
  Administered 2023-01-14: 500 mg via INTRAVENOUS
  Filled 2023-01-14: qty 5

## 2023-01-14 MED ORDER — INSULIN ASPART 100 UNIT/ML IJ SOLN
0.0000 [IU] | Freq: Three times a day (TID) | INTRAMUSCULAR | Status: DC
  Administered 2023-01-14 (×2): 1 [IU] via SUBCUTANEOUS
  Administered 2023-01-15: 3 [IU] via SUBCUTANEOUS
  Administered 2023-01-15 – 2023-01-16 (×2): 2 [IU] via SUBCUTANEOUS
  Administered 2023-01-16: 3 [IU] via SUBCUTANEOUS
  Administered 2023-01-16: 1 [IU] via SUBCUTANEOUS
  Administered 2023-01-17 (×2): 2 [IU] via SUBCUTANEOUS
  Administered 2023-01-18: 1 [IU] via SUBCUTANEOUS
  Administered 2023-01-18: 2 [IU] via SUBCUTANEOUS
  Administered 2023-01-18: 1 [IU] via SUBCUTANEOUS
  Administered 2023-01-19: 2 [IU] via SUBCUTANEOUS
  Administered 2023-01-19: 1 [IU] via SUBCUTANEOUS
  Administered 2023-01-20: 2 [IU] via SUBCUTANEOUS
  Filled 2023-01-14: qty 0.09

## 2023-01-14 MED ORDER — ACETAMINOPHEN 325 MG PO TABS: 650.0000 mg | ORAL_TABLET | Freq: Four times a day (QID) | ORAL | Status: DC | PRN

## 2023-01-14 NOTE — ED Notes (Signed)
Walked pt from room to ems bay and back towards the double doors O2 went from 96 to 90 asked if she felt ok or if she felt dizzy she stated no she was fine

## 2023-01-14 NOTE — Consult Note (Addendum)
Consultation  Referring Provider:   [facility] Primary Care Physician:  Elby Showers, MD Primary Gastroenterologist:  Dr. Silverio Decamp       Reason for Consultation:     Abdominal pain, anemia with thrombocyotpenia   Impression    Pancreatic mass status post EUS 01/09/2023 pending pathology and cytology Very suspicious for pancreatic cancer  Hypoxia Possible pulmonary mets versus atypical infection Has had sinus congestion and epistaxis  Abdominal pain No evidence of pancreatitis on CT Some dilation of pancreatic duct, question pain from pancreatic head mass.  Anemia acute on chronic with associated new onset thrombocytopenia Hgb 8.9, MCV 83, baseline 11 2 weeks ago Hemoccult negative  Thrombocytopenia  History of pulmonary embolus On Eliquis   LOS: 0 days     Plan   Recent EUS with biopsy 01/09/2023 pending pathology, abdominal pain appears to be more chronic rather than acute, patient more so presented due to shortness of breath and epistaxis with sinus congestion.  No pancreatitis, most likely related to pancreatic mass. -Negative Hemoccult blood, anemia and thrombocytopenia question related to infection versus carcinoma -Pain control per primary team -Continue to monitor H&H, transfuse less than 7, consider hematology/oncology consult -PPI once daily for gastric protection in setting of blood thinner/stress  Addendum Pathology shows MALIGNANT CELLS PRESENT CONSISTENT WITH ADENOCARCINOMA  Please consult oncology  Thank you for your kind consultation.   Attending Physician Note   I have taken a history, reviewed the chart and examined the patient. I performed a substantive portion of this encounter, including complete performance of at least one of the key components, in conjunction with the APP. I agree with the APP's note, impression and recommendations with my edits. My additional impressions and recommendations are as follows.   Pancreatic adenocarcinoma  causing abdominal pain.  No further GI evaluation planned. Pain control per primary service. Consult Oncology.   Acute on chronic anemia, thrombocytopenia, heme neg stool, no evidence of overt GI bleeding. Consult oncology.   Hypoxia  History of PE   GI signing off. Available if needed.   Lucio Edward, MD [facility] See Shea Evans, [facility], for our on call provider            HPI:   Victoria [name] is a 82 y.o. female with past medical history significant for cirrhosis, HCV s/p treatment, DM, history of breast cancer, HTN, and history of chronic pancreatitis, PE with cor pulmonale 11/2022 on Eliquis, pancreatic mass 11/2022, presents with abdominal pain and shortness of breath.  Patient in the room alone, no family at bedside. She sitting up on the bed and will have sinus congestion that she spits out into emesis bag, white mucus. Complaining of headache with sinus pressure, has had congestion for several days with nosebleeds last night.  States he is also been having worsening shortness of breath the last several days with cough.  Denies chest pain. States she has had the anterior epigastric discomfort that radiates around both sides "forever" takes hydrocodone as needed for pain at home.   States she has constipation from the hydrocodone will take Colace and occasional Dulcolax, denies melena or hematochezia.  States last bowel movement was several days ago. Denies GERD, nausea, vomiting, rashes. States she has been bruising easily and does point to several ecchymoses and possible rash along lower legs. States she is very tired during questioning patient was very frustrated and did not want to answer any further questions, states everyone keeps asking the same things.  Has had some decreased appetite, decreased weight.  Labs showed worsening normocytic anemia Hgb 9.3 associated with new onset thrombocyotpenia, platelets 44, hemoccult negative, pending immature platlets potassium 3.4,  creatinine 1.07, no BUN elevation. Respiratory panel negative, troponin negative, lipase normal 01/09/2023 Ca199 10  CTA in the ER showed showed cirrhosis, mild biliary dilatation versus periportal edema, cholecystectomy, ill-defined soft tissue mass region head of pancreas 4.9 x 3.5 cm atrophy and dilatation of main pancreatic duct no active inflammatory changes.  Severe diverticulosis without diverticulitis no bowel obstruction or inflammation no evidence of PE, scattered clusters groundglass nodular densities throughout the lungs may represent atypical infection versus metastatic disease retroperitoneal adenopathy  10/10/2023   Colonoscopy July 19, 2019: - Preparation of the colon was fair. - Severe diverticulosis in the sigmoid colon, in the descending colon, in the transverse colon, in the ascending colon and in the cecum. - Non-bleeding internal hemorrhoids. - The examination was otherwise normal.  Abnormal ED labs: Abnormal Labs Reviewed  CBC WITH DIFFERENTIAL/PLATELET - Abnormal; Notable for the following components:      Result Value   RBC 3.34 (*)    Hemoglobin 9.3 (*)    HCT 28.2 (*)    RDW 16.5 (*)    Platelets 44 (*)    All other components within normal limits  COMPREHENSIVE METABOLIC PANEL - Abnormal; Notable for the following components:   Potassium 3.4 (*)    Glucose, Bld 193 (*)    Creatinine, Ser 1.07 (*)    Calcium 8.8 (*)    GFR, Estimated 53 (*)    All other components within normal limits  URINALYSIS, ROUTINE W REFLEX MICROSCOPIC - Abnormal; Notable for the following components:   Protein, ur 30 (*)    All other components within normal limits  CBC - Abnormal; Notable for the following components:   RBC 3.21 (*)    Hemoglobin 8.9 (*)    HCT 26.8 (*)    RDW 16.3 (*)    Platelets 40 (*)    All other components within normal limits  CBG MONITORING, ED - Abnormal; Notable for the following components:   Glucose-Capillary 199 (*)    All other components  within normal limits     Past Medical History:  Diagnosis Date   Abnormal finding on Pap smear    Allergy    SEASONAL   Anxiety    Breast cancer ([place]) 2000   right breast lumptectomy, radiation done   Cirrhosis ([place]) 11/2012   resolved per pt   Diabetes mellitus type 2, controlled ([place])    Hepatitis C    took tx for    Hypertension    Obesity    Thrombocytopenia ([place]) 11/2012    Surgical History:  She  has a past surgical history that includes Thyroidectomy, partial (1972); Breast reduction surgery (2005); Cataract extraction, bilateral (2014); Breast lumpectomy (right); EUS (01/28/2013); Breast lumpectomy; Cholecystectomy (N/A, 03/30/2013); Liver biopsy (03/30/2013); Excision of skin tag (Right, 03/30/2013); Colonoscopy (2021); Polypectomy; Vaginal hysterectomy (N/A, 02/28/2021); Anterior and posterior repair (N/A, 02/28/2021); Cystoscopy (N/A, 02/28/2021); Cystoscopy w/ retrogrades (Left, 02/28/2021); Breast biopsy (Left, 09/09/2016); Reduction mammaplasty; Esophagogastroduodenoscopy (egd) with propofol (N/A, 11/25/2022); EUS (Left, 11/25/2022); Fine needle aspiration (11/25/2022); Esophagogastroduodenoscopy (egd) with propofol (N/A, 01/09/2023); biopsy (01/09/2023); EUS (N/A, 01/09/2023); and Fine needle aspiration (N/A, 01/09/2023). Family History:  Her family history includes Colon cancer (age of onset: 29) in her sister; Diabetes in her mother; Stroke in her mother. Social History:   reports that she has never smoked. She  has never used smokeless tobacco. She reports that she does not drink alcohol and does not use drugs.  Prior to Admission medications   Medication Sig Start Date End Date Taking? Authorizing Provider  albuterol (VENTOLIN HFA) 108 (90 Base) MCG/ACT inhaler Inhale 2 puffs into the lungs every 6 (six) hours as needed for wheezing or shortness of breath. 08/15/22  Yes Baxley, Cresenciano Lick, MD  ALPRAZolam (XANAX) 0.5 MG tablet TAKE 1 TABLET(0.5 MG) BY MOUTH TWICE DAILY AS NEEDED  FOR ANXIETY Patient taking differently: Take 0.5 mg by mouth 2 (two) times daily. 08/12/22  Yes Baxley, Cresenciano Lick, MD  amLODipine (NORVASC) 2.5 MG tablet TAKE 1 TABLET(2.5 MG) BY MOUTH AT BEDTIME 08/04/22  Yes Baxley, Cresenciano Lick, MD  apixaban (ELIQUIS) 5 MG TABS tablet Take 1 tablet (5 mg total) by mouth 2 (two) times daily. 01/11/23  Yes Mansouraty, Telford Nab., MD  cetirizine (ZYRTEC) 10 MG tablet Take 10 mg by mouth daily as needed (allergies).   Yes [provider]  cholecalciferol (VITAMIN D3) 25 MCG (1000 UNIT) tablet Take 1,000 Units by mouth in the morning.   Yes [provider]  HYDROcodone-acetaminophen (NORCO/VICODIN) 5-325 MG tablet Take 1 tablet by mouth every 6 (six) hours as needed. Patient taking differently: Take 1 tablet by mouth every 6 (six) hours as needed for moderate pain or severe pain. 12/27/22  Yes Dorie Rank, MD  loperamide (IMODIUM) 2 MG capsule Take 2-4 mg by mouth 3 (three) times daily as needed for diarrhea or loose stools.   Yes [provider]  losartan-hydrochlorothiazide (HYZAAR) 100-25 MG tablet Take 1 tablet by mouth daily. 12/03/21  Yes Baxley, Cresenciano Lick, MD  metFORMIN (GLUCOPHAGE) 500 MG tablet TAKE 1 TABLET(500 MG) BY MOUTH TWICE DAILY WITH A MEAL 11/20/22  Yes Baxley, Cresenciano Lick, MD  Multiple Vitamin (MULTIVITAMIN WITH MINERALS) TABS tablet Take 1 tablet by mouth in the morning.   Yes [provider]  pantoprazole (PROTONIX) 40 MG tablet Take 1 tablet (40 mg total) by mouth daily. 12/25/22  Yes Baxley, Cresenciano Lick, MD  Blood Glucose Monitoring Suppl (TRUE METRIX METER) w/Device KIT USE AS DIRECTED 07/30/21   Elby Showers, MD  docusate sodium (COLACE) 100 MG capsule Take 1 capsule (100 mg total) by mouth daily. Patient taking differently: Take 100 mg by mouth every 6 (six) hours as needed (with norco for constipation prevention.). 12/27/22   Dorie Rank, MD  glucose blood (TRUE METRIX BLOOD GLUCOSE TEST) test strip TEST BLOOD SUGAR TWICE DAILY 10/10/22    Elby Showers, MD  Lancets (FREESTYLE) lancets CHECK GLUCOSE TWICE A DAY 01/26/20   Elby Showers, MD    No current facility-administered medications for this encounter.   Current Outpatient Medications  Medication Sig Dispense Refill   albuterol (VENTOLIN HFA) 108 (90 Base) MCG/ACT inhaler Inhale 2 puffs into the lungs every 6 (six) hours as needed for wheezing or shortness of breath. 8 g PRN   ALPRAZolam (XANAX) 0.5 MG tablet TAKE 1 TABLET(0.5 MG) BY MOUTH TWICE DAILY AS NEEDED FOR ANXIETY (Patient taking differently: Take 0.5 mg by mouth 2 (two) times daily.) 60 tablet 5   amLODipine (NORVASC) 2.5 MG tablet TAKE 1 TABLET(2.5 MG) BY MOUTH AT BEDTIME 90 tablet 1   apixaban (ELIQUIS) 5 MG TABS tablet Take 1 tablet (5 mg total) by mouth 2 (two) times daily. 60 tablet 2   cetirizine (ZYRTEC) 10 MG tablet Take 10 mg by mouth daily as needed (allergies).  cholecalciferol (VITAMIN D3) 25 MCG (1000 UNIT) tablet Take 1,000 Units by mouth in the morning.     HYDROcodone-acetaminophen (NORCO/VICODIN) 5-325 MG tablet Take 1 tablet by mouth every 6 (six) hours as needed. (Patient taking differently: Take 1 tablet by mouth every 6 (six) hours as needed for moderate pain or severe pain.) 20 tablet 0   loperamide (IMODIUM) 2 MG capsule Take 2-4 mg by mouth 3 (three) times daily as needed for diarrhea or loose stools.     losartan-hydrochlorothiazide (HYZAAR) 100-25 MG tablet Take 1 tablet by mouth daily. 90 tablet 3   metFORMIN (GLUCOPHAGE) 500 MG tablet TAKE 1 TABLET(500 MG) BY MOUTH TWICE DAILY WITH A MEAL 180 tablet 2   Multiple Vitamin (MULTIVITAMIN WITH MINERALS) TABS tablet Take 1 tablet by mouth in the morning.     pantoprazole (PROTONIX) 40 MG tablet Take 1 tablet (40 mg total) by mouth daily. 90 tablet 0   Blood Glucose Monitoring Suppl (TRUE METRIX METER) w/Device KIT USE AS DIRECTED 1 kit prn   docusate sodium (COLACE) 100 MG capsule Take 1 capsule (100 mg total) by mouth daily. (Patient taking  differently: Take 100 mg by mouth every 6 (six) hours as needed (with norco for constipation prevention.).) 60 capsule 0   glucose blood (TRUE METRIX BLOOD GLUCOSE TEST) test strip TEST BLOOD SUGAR TWICE DAILY 200 strip prn   Lancets (FREESTYLE) lancets CHECK GLUCOSE TWICE A DAY 100 each 12    Allergies as of 01/13/2023 - Review Complete 01/13/2023  Allergen Reaction Noted   Lisinopril Cough 10/01/2011    Review of Systems:    Constitutional: No weight loss, fever, chills, weakness or fatigue HEENT: Eyes: No change in vision               Ears, Nose, Throat:  No change in hearing or congestion Skin: No rash or itching Cardiovascular: No chest pain, chest pressure or palpitations   Respiratory: No SOB or cough Gastrointestinal: See HPI and otherwise negative Genitourinary: No dysuria or change in urinary frequency Neurological: No headache, dizziness or syncope Musculoskeletal: No new muscle or joint pain Hematologic: No bleeding or bruising Psychiatric: No history of depression or anxiety     Physical Exam:  Vital signs in last 24 hours: Temp:  [97.5 F (36.4 C)-98.6 F (37 C)] 97.5 F (36.4 C) (01/23 0729) Pulse Rate:  [75-114] 78 (01/23 0545) Resp:  [11-23] 12 (01/23 0545) BP: (113-144)/(59-90) 134/78 (01/23 0545) SpO2:  [91 %-98 %] 92 % (01/23 0545)   Last BM recorded by nurses in past 5 days No data recorded  General:   Pleasant, well developed female in no acute distress Head:  Normocephalic and atraumatic. Eyes: sclerae anicteric,conjunctive pale  Heart:  regular rate and rhythm Pulm: Clear anteriorly; no wheezing Abdomen:  Soft, Obese AB, Active bowel sounds. mild tenderness in the entire abdomen. Without guarding and Without rebound, Extremities:  Without edema. Msk:  Symmetrical without gross deformities. Peripheral pulses intact.  Neurologic:  Alert and  oriented x4;  No focal deficits.  Skin: Ecchymoses over bilateral arms and legs Psychiatric:  Cooperative.   Anxious/tearful mood and normal affect.  LAB RESULTS: Recent Labs    01/13/23 2134 01/13/23 2227  WBC 9.5 8.6  HGB 9.3* 8.9*  HCT 28.2* 26.8*  PLT 44* 40*   BMET Recent Labs    01/13/23 2134  NA 135  K 3.4*  CL 98  CO2 23  GLUCOSE 193*  BUN 13  CREATININE 1.07*  CALCIUM  8.8*   LFT Recent Labs    01/13/23 2134  PROT 7.2  ALBUMIN 3.5  AST 37  ALT 25  ALKPHOS 80  BILITOT 0.7   PT/INR No results for input(s): "LABPROT", "INR" in the last 72 hours.  STUDIES: CT Angio Chest PE W and/or Wo Contrast  Result Date: 01/14/2023 CLINICAL DATA:  Abdominal pain. Also concern for pulmonary embolism. EXAM: CT ANGIOGRAPHY CHEST CT ABDOMEN AND PELVIS WITH CONTRAST TECHNIQUE: Multidetector CT imaging of the chest was performed using the standard protocol during bolus administration of intravenous contrast. Multiplanar CT image reconstructions and MIPs were obtained to evaluate the vascular anatomy. Multidetector CT imaging of the abdomen and pelvis was performed using the standard protocol during bolus administration of intravenous contrast. RADIATION DOSE REDUCTION: This exam was performed according to the departmental dose-optimization program which includes automated exposure control, adjustment of the mA and/or kV according to patient size and/or use of iterative reconstruction technique. CONTRAST:  112m OMNIPAQUE IOHEXOL 350 MG/ML SOLN COMPARISON:  CT abdomen pelvis dated 12/27/2022 and chest CT dated 11/21/2022. FINDINGS: Evaluation is limited due to respiratory motion and streak artifact caused by the patient's arms. CTA CHEST FINDINGS Cardiovascular: There is no cardiomegaly or pericardial effusion. Mild atherosclerotic calcification of the thoracic aorta. No aneurysmal dilatation or dissection. Evaluation of the pulmonary arteries is limited due to respiratory motion and suboptimal visualization of the peripheral branches. No obvious large central pulmonary artery embolus identified.  Mediastinum/Nodes: Top-normal bilateral hilar lymph nodes measure 1 cm short axis. The esophagus is grossly unremarkable. No mediastinal fluid collection. Lungs/Pleura: Scattered clusters of ground-glass nodular densities throughout the lungs suspicious for atypical infection. Metastatic disease is not excluded clinical correlation is recommended. There is no pleural effusion or pneumothorax. The central airways are patent. Musculoskeletal: Degenerative changes of the spine and osteopenia. No acute osseous pathology. Review of the MIP images confirms the above findings. CT ABDOMEN and PELVIS FINDINGS No intra-abdominal free air or free fluid. Hepatobiliary: Cirrhosis. Mild biliary dilatation versus periportal edema. Cholecystectomy. Pancreas: Ill-defined soft tissue mass in the region of the head of the pancreas measuring approximately 4.9 x 3.5 cm (22/4). There is atrophy of the body of the pancreas and dilatation of the main pancreatic duct. No active inflammatory changes. Spleen: Normal in size without focal abnormality. Adrenals/Urinary Tract: The adrenal glands unremarkable. Small bilateral renal cysts. There is no hydronephrosis on either side. There is symmetric enhancement and excretion of contrast by both kidneys. The visualized ureters and urinary bladder appear unremarkable. Stomach/Bowel: Severe colonic diverticulosis without active inflammatory changes. There is no bowel obstruction active inflammation. The appendix is normal. Vascular/Lymphatic: Moderate aortoiliac atherosclerotic disease. The IVC is unremarkable. No portal venous gas. Retroperitoneal adenopathy measures 13 mm to the left of the aorta, 17 mm in the periportal region, and 19 mm in the gastrohepatic vision. Reproductive: Hysterectomy.  No adnexal masses. Other: Small fat containing umbilical and supraumbilical hernia. Musculoskeletal: Degenerative changes of the spine. No acute osseous pathology. Review of the MIP images confirms the  above findings. IMPRESSION: 1. No CT evidence of central pulmonary artery embolus. 2. Scattered clusters of ground-glass nodular densities throughout the lungs may represent atypical infection or metastatic disease. 3. Ill-defined soft tissue mass in the region of the head of the pancreas with atrophy of the body of the pancreas and dilatation of the main pancreatic duct. Findings are concerning for pancreatic malignancy. Further evaluation with MRI with and without contrast recommended. 4. Retroperitoneal adenopathy. 5. Cirrhosis. 6. Severe colonic diverticulosis  without active inflammatory changes. No bowel obstruction. Normal appendix. 7.  Aortic Atherosclerosis (ICD10-I70.0). Electronically Signed   By: Anner Crete M.D.   On: 01/14/2023 00:04   CT ABDOMEN PELVIS W CONTRAST  Result Date: 01/14/2023 CLINICAL DATA:  Abdominal pain. Also concern for pulmonary embolism. EXAM: CT ANGIOGRAPHY CHEST CT ABDOMEN AND PELVIS WITH CONTRAST TECHNIQUE: Multidetector CT imaging of the chest was performed using the standard protocol during bolus administration of intravenous contrast. Multiplanar CT image reconstructions and MIPs were obtained to evaluate the vascular anatomy. Multidetector CT imaging of the abdomen and pelvis was performed using the standard protocol during bolus administration of intravenous contrast. RADIATION DOSE REDUCTION: This exam was performed according to the departmental dose-optimization program which includes automated exposure control, adjustment of the mA and/or kV according to patient size and/or use of iterative reconstruction technique. CONTRAST:  138m OMNIPAQUE IOHEXOL 350 MG/ML SOLN COMPARISON:  CT abdomen pelvis dated 12/27/2022 and chest CT dated 11/21/2022. FINDINGS: Evaluation is limited due to respiratory motion and streak artifact caused by the patient's arms. CTA CHEST FINDINGS Cardiovascular: There is no cardiomegaly or pericardial effusion. Mild atherosclerotic  calcification of the thoracic aorta. No aneurysmal dilatation or dissection. Evaluation of the pulmonary arteries is limited due to respiratory motion and suboptimal visualization of the peripheral branches. No obvious large central pulmonary artery embolus identified. Mediastinum/Nodes: Top-normal bilateral hilar lymph nodes measure 1 cm short axis. The esophagus is grossly unremarkable. No mediastinal fluid collection. Lungs/Pleura: Scattered clusters of ground-glass nodular densities throughout the lungs suspicious for atypical infection. Metastatic disease is not excluded clinical correlation is recommended. There is no pleural effusion or pneumothorax. The central airways are patent. Musculoskeletal: Degenerative changes of the spine and osteopenia. No acute osseous pathology. Review of the MIP images confirms the above findings. CT ABDOMEN and PELVIS FINDINGS No intra-abdominal free air or free fluid. Hepatobiliary: Cirrhosis. Mild biliary dilatation versus periportal edema. Cholecystectomy. Pancreas: Ill-defined soft tissue mass in the region of the head of the pancreas measuring approximately 4.9 x 3.5 cm (22/4). There is atrophy of the body of the pancreas and dilatation of the main pancreatic duct. No active inflammatory changes. Spleen: Normal in size without focal abnormality. Adrenals/Urinary Tract: The adrenal glands unremarkable. Small bilateral renal cysts. There is no hydronephrosis on either side. There is symmetric enhancement and excretion of contrast by both kidneys. The visualized ureters and urinary bladder appear unremarkable. Stomach/Bowel: Severe colonic diverticulosis without active inflammatory changes. There is no bowel obstruction active inflammation. The appendix is normal. Vascular/Lymphatic: Moderate aortoiliac atherosclerotic disease. The IVC is unremarkable. No portal venous gas. Retroperitoneal adenopathy measures 13 mm to the left of the aorta, 17 mm in the periportal region, and  19 mm in the gastrohepatic vision. Reproductive: Hysterectomy.  No adnexal masses. Other: Small fat containing umbilical and supraumbilical hernia. Musculoskeletal: Degenerative changes of the spine. No acute osseous pathology. Review of the MIP images confirms the above findings. IMPRESSION: 1. No CT evidence of central pulmonary artery embolus. 2. Scattered clusters of ground-glass nodular densities throughout the lungs may represent atypical infection or metastatic disease. 3. Ill-defined soft tissue mass in the region of the head of the pancreas with atrophy of the body of the pancreas and dilatation of the main pancreatic duct. Findings are concerning for pancreatic malignancy. Further evaluation with MRI with and without contrast recommended. 4. Retroperitoneal adenopathy. 5. Cirrhosis. 6. Severe colonic diverticulosis without active inflammatory changes. No bowel obstruction. Normal appendix. 7.  Aortic Atherosclerosis (ICD10-I70.0). Electronically Signed  By: Anner Crete M.D.   On: 01/14/2023 00:04     Vladimir Crofts  01/14/2023, 8:10 AM

## 2023-01-14 NOTE — ED Notes (Signed)
ED TO INPATIENT HANDOFF REPORT  ED Nurse Name and Phone #: Ronalee Belts [phone]  S Name/Age/Gender Victoria [name] August 82 y.o. female Room/Bed: WA18/WA18  Code Status   Code Status: Full Code  Home/SNF/Other Home Patient oriented to: self, place, time, and situation Is this baseline? Yes   Triage Complete: Triage complete  Chief Complaint Hypoxia [R09.02] Pneumonia [J18.9]  Triage Note Pt arrives with reports of coughing up blood. Pt reports recent blood clot in December and came off of blood thinner for two days last week for her endoscopy on Wednesday. Pt reports abdominal pain as well due to issues with pancreas. Pt reports SHOB and emesis as well. Pt reports feeling dizzy. Pt restarted taking blood thinner on Friday.    Allergies Allergies  Allergen Reactions   Lisinopril Cough    Level of Care/Admitting Diagnosis ED Disposition     ED Disposition  Admit   Condition  --   Comment  Hospital Area: [facility] [[id number]]  Level of Care: Med-Surg [16]  May admit patient to [facility] or [facility] if equivalent level of care is available:: Yes  Covid Evaluation: Asymptomatic - no recent exposure (last 10 days) testing not required  Diagnosis: Pneumonia [268341]  Admitting Physician: Hartwell, Cedro  Attending Physician: Hosie Poisson [[id number]]  Certification:: I certify this patient will need inpatient services for at least 2 midnights  Estimated Length of Stay: 2          B Medical/Surgery History Past Medical History:  Diagnosis Date   Abnormal finding on Pap smear    Allergy    SEASONAL   Anxiety    Breast cancer ([place]) 2000   right breast lumptectomy, radiation done   Cirrhosis ([place]) 11/2012   resolved per pt   Diabetes mellitus type 2, controlled ([place])    Hepatitis C    took tx for    Hypertension    Obesity    Thrombocytopenia ([place]) 11/2012   Past Surgical History:  Procedure Laterality Date   ANTERIOR AND POSTERIOR REPAIR  N/A 02/28/2021   Procedure: ANTERIOR (CYSTOCELE) AND POSTERIOR REPAIR (RECTOCELE);  Surgeon: Cheri Fowler, MD;  Location: [facility];  Service: Gynecology;  Laterality: N/A;   BIOPSY  01/09/2023   Procedure: BIOPSY;  Surgeon: Irving Copas., MD;  [facility];  Service: Gastroenterology;;   BREAST BIOPSY Left 09/09/2016   BREAST LUMPECTOMY  right   BREAST LUMPECTOMY     BREAST REDUCTION SURGERY  2005   CATARACT EXTRACTION, BILATERAL  2014   CHOLECYSTECTOMY N/A 03/30/2013   Procedure: LAPAROSCOPIC CHOLECYSTECTOMY WITH INTRAOPERATIVE CHOLANGIOGRAM;  Surgeon: Shann Medal, MD;  [facility];  Service: General;  Laterality: N/A;   COLONOSCOPY  2021   CYSTOSCOPY N/A 02/28/2021   Procedure: Consuela Mimes;  Surgeon: Cheri Fowler, MD;  Location: [facility];  Service: Gynecology;  Laterality: N/A;   CYSTOSCOPY W/ RETROGRADES Left 02/28/2021   Procedure: CYSTOSCOPY WITH RETROGRADE PYELOGRAM;  Surgeon: Cheri Fowler, MD;  [facility];  Service: Gynecology;  Laterality: Left;   ESOPHAGOGASTRODUODENOSCOPY (EGD) WITH PROPOFOL N/A 11/25/2022   Procedure: ESOPHAGOGASTRODUODENOSCOPY (EGD) WITH PROPOFOL;  Surgeon: Carol Ada, MD;  Location: [place];  Service: Gastroenterology;  Laterality: N/A;   ESOPHAGOGASTRODUODENOSCOPY (EGD) WITH PROPOFOL N/A 01/09/2023   Procedure: ESOPHAGOGASTRODUODENOSCOPY (EGD) WITH PROPOFOL;  Surgeon: Rush Landmark Telford Nab., MD;  [facility];  Service: Gastroenterology;  Laterality: N/A;   EUS  01/28/2013   Procedure: UPPER ENDOSCOPIC ULTRASOUND (EUS)  LINEAR;  Surgeon: Milus Banister, MD;  [facility];  Service: Endoscopy;  Laterality: N/A;  demaris bousquet [phone]   EUS Left 11/25/2022   Procedure: UPPER ENDOSCOPIC ULTRASOUND (EUS) LINEAR;  Surgeon: Carol Ada, MD;  Location: [facility];  Service: Gastroenterology;  Laterality: Left;   EUS N/A 01/09/2023    Procedure: UPPER ENDOSCOPIC ULTRASOUND (EUS) LINEAR;  Surgeon: Irving Copas., MD;  [facility];  Service: Gastroenterology;  Laterality: N/A;   EXCISION OF SKIN TAG Right 03/30/2013   Procedure: EXCISION OF SKIN TAG;  Surgeon: Shann Medal, MD;  [facility];  Service: General;  Laterality: Right;   FINE NEEDLE ASPIRATION  11/25/2022   Procedure: FINE NEEDLE ASPIRATION (FNA) LINEAR;  Surgeon: Carol Ada, MD;  Location: [facility];  Service: Gastroenterology;;   FINE NEEDLE ASPIRATION N/A 01/09/2023   Procedure: FINE NEEDLE ASPIRATION (FNA) LINEAR;  Surgeon: Irving Copas., MD;  [facility];  Service: Gastroenterology;  Laterality: N/A;   LIVER BIOPSY  03/30/2013   Procedure: LIVER BIOPSY;  Surgeon: Shann Medal, MD;  [facility];  Service: General;;   POLYPECTOMY     REDUCTION MAMMAPLASTY     THYROIDECTOMY, PARTIAL  1972   left   VAGINAL HYSTERECTOMY N/A 02/28/2021   Procedure: HYSTERECTOMY VAGINAL WITH UNILATERAL SALPINGECTOMY;  Surgeon: Cheri Fowler, MD;  Location: [facility];  Service: Gynecology;  Laterality: N/A;     A IV Location/Drains/Wounds Patient Lines/Drains/Airways Status     Active Line/Drains/Airways     Name Placement date Placement time Site Days   Peripheral IV 01/13/23 20 G Posterior;Proximal;Right Forearm 01/13/23  2155  Forearm  1   Incision - 4 Ports Abdomen Umbilicus Right;Mid;Lateral Right;Lateral;Upper Upper;Medial 03/30/13  1356  -- 3577            Intake/Output Last 24 hours No intake or output data in the 24 hours ending 01/14/23 1502  Labs/Imaging Results for orders placed or performed during the hospital encounter of 01/13/23 (from the past 48 hour(s))  POC CBG, ED     Status: Abnormal   Collection Time: 01/13/23  9:21 PM  Result Value Ref Range   Glucose-Capillary 199 (H) 70 - 99 mg/dL    Comment: Glucose reference range applies only to samples taken after fasting for  at least 8 hours.  CBC with Differential     Status: Abnormal   Collection Time: 01/13/23  9:34 PM  Result Value Ref Range   WBC 9.5 4.0 - 10.5 K/uL   RBC 3.34 (L) 3.87 - 5.11 MIL/uL   Hemoglobin 9.3 (L) 12.0 - 15.0 g/dL   HCT 28.2 (L) 36.0 - 46.0 %   MCV 84.4 80.0 - 100.0 fL   MCH 27.8 26.0 - 34.0 pg   MCHC 33.0 30.0 - 36.0 g/dL   RDW 16.5 (H) 11.5 - 15.5 %   Platelets 44 (L) 150 - 400 K/uL    Comment: SPECIMEN CHECKED FOR CLOTS Immature Platelet Fraction may be clinically indicated, consider ordering this additional test [id number] REPEATED TO VERIFY PLATELET COUNT CONFIRMED BY SMEAR    nRBC 0.0 0.0 - 0.2 %   Neutrophils Relative % 74 %   Neutro Abs 7.1 1.7 - 7.7 K/uL   Lymphocytes Relative 16 %   Lymphs Abs 1.5 0.7 - 4.0 K/uL   Monocytes Relative 8 %   Monocytes Absolute 0.7 0.1 - 1.0 K/uL   Eosinophils Relative 1 %   Eosinophils Absolute 0.1 0.0 - 0.5  K/uL   Basophils Relative 0 %   Basophils Absolute 0.0 0.0 - 0.1 K/uL   Immature Granulocytes 1 %   Abs Immature Granulocytes 0.05 0.00 - 0.07 K/uL    Comment: Performed at [facility], [place] [address] [place], [place] [address]  Comprehensive metabolic panel     Status: Abnormal   Collection Time: 01/13/23  9:34 PM  Result Value Ref Range   Sodium 135 135 - 145 mmol/L   Potassium 3.4 (L) 3.5 - 5.1 mmol/L   Chloride 98 98 - 111 mmol/L   CO2 23 22 - 32 mmol/L   Glucose, Bld 193 (H) 70 - 99 mg/dL    Comment: Glucose reference range applies only to samples taken after fasting for at least 8 hours.   BUN 13 8 - 23 mg/dL   Creatinine, Ser 1.07 (H) 0.44 - 1.00 mg/dL   Calcium 8.8 (L) 8.9 - 10.3 mg/dL   Total Protein 7.2 6.5 - 8.1 g/dL   Albumin 3.5 3.5 - 5.0 g/dL   AST 37 15 - 41 U/L   ALT 25 0 - 44 U/L   Alkaline Phosphatase 80 38 - 126 U/L   Total Bilirubin 0.7 0.3 - 1.2 mg/dL   GFR, Estimated 53 (L) >60 mL/min    Comment: (NOTE) Calculated using the CKD-EPI Creatinine Equation (2021)    Anion  gap 14 5 - 15    Comment: Performed at [facility], [place] [address], [place], [place] [address]  Lipase, blood     Status: None   Collection Time: 01/13/23  9:34 PM  Result Value Ref Range   Lipase 27 11 - 51 U/L    Comment: Performed at [facility], [place] [address], [place], [place] [address]  Troponin I (High Sensitivity)     Status: None   Collection Time: 01/13/23  9:34 PM  Result Value Ref Range   Troponin I (High Sensitivity) 6 <18 ng/L    Comment: (NOTE) Elevated high sensitivity troponin I (hsTnI) values and significant  changes across serial measurements may suggest ACS but many other  chronic and acute conditions are known to elevate hsTnI results.  Refer to the "Links" section for chest pain algorithms and additional  guidance. Performed at [facility], [place] [address], [place], [place] [address]   Urinalysis, Routine w reflex microscopic Urine, Clean Catch     Status: Abnormal   Collection Time: 01/13/23  9:39 PM  Result Value Ref Range   Color, Urine YELLOW YELLOW   APPearance CLEAR CLEAR   Specific Gravity, Urine 1.016 1.005 - 1.030   pH 6.0 5.0 - 8.0   Glucose, UA NEGATIVE NEGATIVE mg/dL   Hgb urine dipstick NEGATIVE NEGATIVE   Bilirubin Urine NEGATIVE NEGATIVE   Ketones, ur NEGATIVE NEGATIVE mg/dL   Protein, ur 30 (A) NEGATIVE mg/dL   Nitrite NEGATIVE NEGATIVE   Leukocytes,Ua NEGATIVE NEGATIVE   RBC / HPF 0-5 0 - 5 RBC/hpf   WBC, UA 0-5 0 - 5 WBC/hpf   Bacteria, UA NONE SEEN NONE SEEN   Squamous Epithelial / HPF 0-5 0 - 5 /HPF   Mucus PRESENT    Hyaline Casts, UA PRESENT     Comment: Performed at [facility], [place] [address], [place],  [address]  Resp panel by RT-PCR (RSV, Flu A&B, Covid) Anterior Nasal Swab     Status: None   Collection Time: 01/13/23  9:39 PM   Specimen: Anterior  Nasal Swab  Result Value Ref Range   SARS Coronavirus 2 by RT PCR NEGATIVE NEGATIVE     Comment: (NOTE) SARS-CoV-2 target nucleic acids are NOT DETECTED.  The SARS-CoV-2 RNA is generally detectable in upper respiratory specimens during the acute phase of infection. The lowest concentration of SARS-CoV-2 viral copies this assay can detect is 138 copies/mL. A negative result does not preclude SARS-Cov-2 infection and should not be used as the sole basis for treatment or other patient management decisions. A negative result may occur with  improper specimen collection/handling, submission of specimen other than nasopharyngeal swab, presence of viral mutation(s) within the areas targeted by this assay, and inadequate number of viral copies(<138 copies/mL). A negative result must be combined with clinical observations, patient history, and epidemiological information. The expected result is Negative.  Fact Sheet for Patients:  EntrepreneurPulse.com.au  Fact Sheet for Healthcare Providers:  IncredibleEmployment.be  This test is no t yet approved or cleared by the [place] FDA and  has been authorized for detection and/or diagnosis of SARS-CoV-2 by FDA under an Emergency Use Authorization (EUA). This EUA will remain  in effect (meaning this test can be used) for the duration of the COVID-19 declaration under Section 564(b)(1) of the Act, 21 U.S.C.section 360bbb-3(b)(1), unless the authorization is terminated  or revoked sooner.       Influenza A by PCR NEGATIVE NEGATIVE   Influenza B by PCR NEGATIVE NEGATIVE    Comment: (NOTE) The Xpert Xpress SARS-CoV-2/FLU/RSV plus assay is intended as an aid in the diagnosis of influenza from Nasopharyngeal swab specimens and should not be used as a sole basis for treatment. Nasal washings and aspirates are unacceptable for Xpert Xpress SARS-CoV-2/FLU/RSV testing.  Fact Sheet for Patients: EntrepreneurPulse.com.au  Fact Sheet for Healthcare  Providers: IncredibleEmployment.be  This test is not yet approved or cleared by the [place] FDA and has been authorized for detection and/or diagnosis of SARS-CoV-2 by FDA under an Emergency Use Authorization (EUA). This EUA will remain in effect (meaning this test can be used) for the duration of the COVID-19 declaration under Section 564(b)(1) of the Act, 21 U.S.C. section 360bbb-3(b)(1), unless the authorization is terminated or revoked.     Resp Syncytial Virus by PCR NEGATIVE NEGATIVE    Comment: (NOTE) Fact Sheet for Patients: EntrepreneurPulse.com.au  Fact Sheet for Healthcare Providers: IncredibleEmployment.be  This test is not yet approved or cleared by the [place] FDA and has been authorized for detection and/or diagnosis of SARS-CoV-2 by FDA under an Emergency Use Authorization (EUA). This EUA will remain in effect (meaning this test can be used) for the duration of the COVID-19 declaration under Section 564(b)(1) of the Act, 21 U.S.C. section 360bbb-3(b)(1), unless the authorization is terminated or revoked.  Performed at [facility], [place] [address] [place], [place] [address]   CBC     Status: Abnormal   Collection Time: 01/13/23 10:27 PM  Result Value Ref Range   WBC 8.6 4.0 - 10.5 K/uL   RBC 3.21 (L) 3.87 - 5.11 MIL/uL   Hemoglobin 8.9 (L) 12.0 - 15.0 g/dL   HCT 26.8 (L) 36.0 - 46.0 %   MCV 83.5 80.0 - 100.0 fL   MCH 27.7 26.0 - 34.0 pg   MCHC 33.2 30.0 - 36.0 g/dL   RDW 16.3 (H) 11.5 - 15.5 %   Platelets 40 (L) 150 - 400 K/uL    Comment: Immature Platelet Fraction may be clinically indicated, consider ordering this additional test [id number]  CONSISTENT WITH PREVIOUS RESULT REPEATED TO VERIFY    nRBC 0.0 0.0 - 0.2 %    Comment: Performed at [facility], [place] [address], [place], [place] [address]  POC occult blood, ED Provider will collect      Status: None   Collection Time: 01/14/23 12:37 AM  Result Value Ref Range   Fecal Occult Bld NEGATIVE NEGATIVE  CBG monitoring, ED     Status: Abnormal   Collection Time: 01/14/23 12:21 PM  Result Value Ref Range   Glucose-Capillary 135 (H) 70 - 99 mg/dL    Comment: Glucose reference range applies only to samples taken after fasting for at least 8 hours.   CT Angio Chest PE W and/or Wo Contrast  Result Date: 01/14/2023 CLINICAL DATA:  Abdominal pain. Also concern for pulmonary embolism. EXAM: CT ANGIOGRAPHY CHEST CT ABDOMEN AND PELVIS WITH CONTRAST TECHNIQUE: Multidetector CT imaging of the chest was performed using the standard protocol during bolus administration of intravenous contrast. Multiplanar CT image reconstructions and MIPs were obtained to evaluate the vascular anatomy. Multidetector CT imaging of the abdomen and pelvis was performed using the standard protocol during bolus administration of intravenous contrast. RADIATION DOSE REDUCTION: This exam was performed according to the departmental dose-optimization program which includes automated exposure control, adjustment of the mA and/or kV according to patient size and/or use of iterative reconstruction technique. CONTRAST:  169m OMNIPAQUE IOHEXOL 350 MG/ML SOLN COMPARISON:  CT abdomen pelvis dated 12/27/2022 and chest CT dated 11/21/2022. FINDINGS: Evaluation is limited due to respiratory motion and streak artifact caused by the patient's arms. CTA CHEST FINDINGS Cardiovascular: There is no cardiomegaly or pericardial effusion. Mild atherosclerotic calcification of the thoracic aorta. No aneurysmal dilatation or dissection. Evaluation of the pulmonary arteries is limited due to respiratory motion and suboptimal visualization of the peripheral branches. No obvious large central pulmonary artery embolus identified. Mediastinum/Nodes: Top-normal bilateral hilar lymph nodes measure 1 cm short axis. The esophagus is grossly unremarkable. No  mediastinal fluid collection. Lungs/Pleura: Scattered clusters of ground-glass nodular densities throughout the lungs suspicious for atypical infection. Metastatic disease is not excluded clinical correlation is recommended. There is no pleural effusion or pneumothorax. The central airways are patent. Musculoskeletal: Degenerative changes of the spine and osteopenia. No acute osseous pathology. Review of the MIP images confirms the above findings. CT ABDOMEN and PELVIS FINDINGS No intra-abdominal free air or free fluid. Hepatobiliary: Cirrhosis. Mild biliary dilatation versus periportal edema. Cholecystectomy. Pancreas: Ill-defined soft tissue mass in the region of the head of the pancreas measuring approximately 4.9 x 3.5 cm (22/4). There is atrophy of the body of the pancreas and dilatation of the main pancreatic duct. No active inflammatory changes. Spleen: Normal in size without focal abnormality. Adrenals/Urinary Tract: The adrenal glands unremarkable. Small bilateral renal cysts. There is no hydronephrosis on either side. There is symmetric enhancement and excretion of contrast by both kidneys. The visualized ureters and urinary bladder appear unremarkable. Stomach/Bowel: Severe colonic diverticulosis without active inflammatory changes. There is no bowel obstruction active inflammation. The appendix is normal. Vascular/Lymphatic: Moderate aortoiliac atherosclerotic disease. The IVC is unremarkable. No portal venous gas. Retroperitoneal adenopathy measures 13 mm to the left of the aorta, 17 mm in the periportal region, and 19 mm in the gastrohepatic vision. Reproductive: Hysterectomy.  No adnexal masses. Other: Small fat containing umbilical and supraumbilical hernia. Musculoskeletal: Degenerative changes of the spine. No acute osseous pathology. Review of the MIP images confirms the above findings. IMPRESSION: 1. No CT evidence of central  pulmonary artery embolus. 2. Scattered clusters of ground-glass nodular  densities throughout the lungs may represent atypical infection or metastatic disease. 3. Ill-defined soft tissue mass in the region of the head of the pancreas with atrophy of the body of the pancreas and dilatation of the main pancreatic duct. Findings are concerning for pancreatic malignancy. Further evaluation with MRI with and without contrast recommended. 4. Retroperitoneal adenopathy. 5. Cirrhosis. 6. Severe colonic diverticulosis without active inflammatory changes. No bowel obstruction. Normal appendix. 7.  Aortic Atherosclerosis (ICD10-I70.0). Electronically Signed   By: Anner Crete M.D.   On: 01/14/2023 00:04   CT ABDOMEN PELVIS W CONTRAST  Result Date: 01/14/2023 CLINICAL DATA:  Abdominal pain. Also concern for pulmonary embolism. EXAM: CT ANGIOGRAPHY CHEST CT ABDOMEN AND PELVIS WITH CONTRAST TECHNIQUE: Multidetector CT imaging of the chest was performed using the standard protocol during bolus administration of intravenous contrast. Multiplanar CT image reconstructions and MIPs were obtained to evaluate the vascular anatomy. Multidetector CT imaging of the abdomen and pelvis was performed using the standard protocol during bolus administration of intravenous contrast. RADIATION DOSE REDUCTION: This exam was performed according to the departmental dose-optimization program which includes automated exposure control, adjustment of the mA and/or kV according to patient size and/or use of iterative reconstruction technique. CONTRAST:  15m OMNIPAQUE IOHEXOL 350 MG/ML SOLN COMPARISON:  CT abdomen pelvis dated 12/27/2022 and chest CT dated 11/21/2022. FINDINGS: Evaluation is limited due to respiratory motion and streak artifact caused by the patient's arms. CTA CHEST FINDINGS Cardiovascular: There is no cardiomegaly or pericardial effusion. Mild atherosclerotic calcification of the thoracic aorta. No aneurysmal dilatation or dissection. Evaluation of the pulmonary arteries is limited due to  respiratory motion and suboptimal visualization of the peripheral branches. No obvious large central pulmonary artery embolus identified. Mediastinum/Nodes: Top-normal bilateral hilar lymph nodes measure 1 cm short axis. The esophagus is grossly unremarkable. No mediastinal fluid collection. Lungs/Pleura: Scattered clusters of ground-glass nodular densities throughout the lungs suspicious for atypical infection. Metastatic disease is not excluded clinical correlation is recommended. There is no pleural effusion or pneumothorax. The central airways are patent. Musculoskeletal: Degenerative changes of the spine and osteopenia. No acute osseous pathology. Review of the MIP images confirms the above findings. CT ABDOMEN and PELVIS FINDINGS No intra-abdominal free air or free fluid. Hepatobiliary: Cirrhosis. Mild biliary dilatation versus periportal edema. Cholecystectomy. Pancreas: Ill-defined soft tissue mass in the region of the head of the pancreas measuring approximately 4.9 x 3.5 cm (22/4). There is atrophy of the body of the pancreas and dilatation of the main pancreatic duct. No active inflammatory changes. Spleen: Normal in size without focal abnormality. Adrenals/Urinary Tract: The adrenal glands unremarkable. Small bilateral renal cysts. There is no hydronephrosis on either side. There is symmetric enhancement and excretion of contrast by both kidneys. The visualized ureters and urinary bladder appear unremarkable. Stomach/Bowel: Severe colonic diverticulosis without active inflammatory changes. There is no bowel obstruction active inflammation. The appendix is normal. Vascular/Lymphatic: Moderate aortoiliac atherosclerotic disease. The IVC is unremarkable. No portal venous gas. Retroperitoneal adenopathy measures 13 mm to the left of the aorta, 17 mm in the periportal region, and 19 mm in the gastrohepatic vision. Reproductive: Hysterectomy.  No adnexal masses. Other: Small fat containing umbilical and  supraumbilical hernia. Musculoskeletal: Degenerative changes of the spine. No acute osseous pathology. Review of the MIP images confirms the above findings. IMPRESSION: 1. No CT evidence of central pulmonary artery embolus. 2. Scattered clusters of ground-glass nodular densities throughout the lungs may represent atypical infection  or metastatic disease. 3. Ill-defined soft tissue mass in the region of the head of the pancreas with atrophy of the body of the pancreas and dilatation of the main pancreatic duct. Findings are concerning for pancreatic malignancy. Further evaluation with MRI with and without contrast recommended. 4. Retroperitoneal adenopathy. 5. Cirrhosis. 6. Severe colonic diverticulosis without active inflammatory changes. No bowel obstruction. Normal appendix. 7.  Aortic Atherosclerosis (ICD10-I70.0). Electronically Signed   By: Anner Crete M.D.   On: 01/14/2023 00:04    Pending Labs Unresulted Labs (From admission, onward)     Start     Ordered   01/15/23 0500  Comprehensive metabolic panel  Tomorrow morning,   R        01/14/23 1107   01/15/23 0500  CBC  Tomorrow morning,   R        01/14/23 1107   01/14/23 1010  Respiratory (~20 pathogens) panel by PCR  (Respiratory panel by PCR (~20 pathogens, ~24 hr TAT)  w precautions)  Once,   R        01/14/23 1009   01/13/23 2227  Immature Platelet Fraction  Once,   URGENT        01/13/23 2226            Vitals/Pain Today's Vitals   01/14/23 1100 01/14/23 1145 01/14/23 1200 01/14/23 1300  BP: (!) 144/98 (!) 144/98 (!) 155/89 (!) 154/95  Pulse: 79 84 73 95  Resp: 12 (!) '23 14 20  '$ Temp:  98.1 F (36.7 C)    TempSrc:  Oral    SpO2: 92% 96% 97% 95%  Height:      PainSc:        Isolation Precautions Droplet precaution  Medications Medications  HYDROcodone-acetaminophen (NORCO/VICODIN) 5-325 MG per tablet 1 tablet (1 tablet Oral Given 01/14/23 1152)  ALPRAZolam (XANAX) tablet 0.5 mg (0.5 mg Oral Given 01/14/23 1152)   docusate sodium (COLACE) capsule 100 mg (100 mg Oral Given 01/14/23 1152)  multivitamin with minerals tablet 1 tablet (1 tablet Oral Given 01/14/23 1152)  acetaminophen (TYLENOL) tablet 650 mg (has no administration in time range)    Or  acetaminophen (TYLENOL) suppository 650 mg (has no administration in time range)  morphine (PF) 2 MG/ML injection 1 mg (has no administration in time range)  ondansetron (ZOFRAN) tablet 4 mg (has no administration in time range)    Or  ondansetron (ZOFRAN) injection 4 mg (has no administration in time range)  insulin aspart (novoLOG) injection 0-9 Units (1 Units Subcutaneous Given 01/14/23 1225)  cefTRIAXone (ROCEPHIN) 1 g in sodium chloride 0.9 % 100 mL IVPB (has no administration in time range)  azithromycin (ZITHROMAX) 500 mg in sodium chloride 0.9 % 250 mL IVPB (has no administration in time range)  pantoprazole (PROTONIX) EC tablet 40 mg (40 mg Oral Given 01/14/23 1102)  albuterol (PROVENTIL) (2.5 MG/3ML) 0.083% nebulizer solution 2.5 mg (has no administration in time range)  oxyCODONE-acetaminophen (PERCOCET/ROXICET) 5-325 MG per tablet 1 tablet (1 tablet Oral Given 01/13/23 2154)  HYDROmorphone (DILAUDID) injection 1 mg (1 mg Intravenous Given 01/13/23 2155)  iohexol (OMNIPAQUE) 350 MG/ML injection 100 mL (100 mLs Intravenous Contrast Given 01/13/23 2302)  cefTRIAXone (ROCEPHIN) 1 g in sodium chloride 0.9 % 100 mL IVPB (0 g Intravenous Stopped 01/14/23 0341)  azithromycin (ZITHROMAX) 500 mg in sodium chloride 0.9 % 250 mL IVPB (0 mg Intravenous Stopped 01/14/23 0341)  ketorolac (TORADOL) 30 MG/ML injection 15 mg (15 mg Intravenous Given 01/14/23 0131)  potassium chloride SA (  KLOR-CON M) CR tablet 40 mEq (40 mEq Oral Given 01/14/23 1102)    Mobility walks     Focused Assessments Pulmonary Assessment Handoff:  Lung sounds:   O2 Device: Room Air      R Recommendations: See Admitting Provider Note  Report given to:   Additional Notes: .

## 2023-01-14 NOTE — H&P (Signed)
History and Physical    Patient: Victoria [name] YQM:[id number] DOB: 17-Apr-1941 DOA: 01/13/2023 DOS: the patient was seen and examined on 01/14/2023 PCP: Elby Showers, MD  Patient coming from: Home  Chief Complaint:  Chief Complaint  Patient presents with   Abdominal Pain   Shortness of Breath   HPI: Victoria [name] is a 82 y.o. female with medical history significant of type 2 diabetes mellitus on metformin, history of hepatitis C status posttreatment, history of right breast cancer s/p lumpectomy, hypertension, thrombocytopenia, recently diagnosed with pancreatic mass underwent EGD/EUS by Dr. Stefani Dama Roddy on January 18, with pending results, presents today with shortness of breath, cough and epistaxis.  Patient reports that she had right epistaxis and also was coughing up some blood.  She reports shortness of breath and some left-sided chest pain.  She denies fevers or chills, nausea or vomiting she reports she has mild generalized abdominal pain intermittently.  She denies hematochezia or any urinary problems.  Syncope or tingling or numbness or weakness.  On arrival to ED she was afebrile, tachycardic, tachypnea tensive. Labs were significant for normal WBC count, hemoglobin of 9.3 and platelet count of 44,000. CMP shows creatinine of 1.07 and potassium of 3.4, glucose of 193. Influenza, RSV and COVID PCR are negative and urine analysis negative for infection. Fecal occult blood is negative  CT of the abdomen and pelvis with contrast and CT angiogram of the chest for evaluation of PE done Which showed Scattered clusters of ground-glass nodular densities throughout the lungs may represent atypical infection or metastatic disease.  Ill-defined soft tissue mass in the region of the head of the pancreas with atrophy of the body of the pancreas and dilatation of the main pancreatic duct. Findings are concerning for pancreatic malignancy. Retroperitoneal adenopathy Cirrhosis Severe  colonic diverticulosis present   She was [facility] to [facility] for possible pneumonia     Review of Systems: As mentioned in the history of present illness. All other systems reviewed and are negative. Past Medical History:  Diagnosis Date   Abnormal finding on Pap smear    Allergy    SEASONAL   Anxiety    Breast cancer ([place]) 2000   right breast lumptectomy, radiation done   Cirrhosis ([place]) 11/2012   resolved per pt   Diabetes mellitus type 2, controlled ([place])    Hepatitis C    took tx for    Hypertension    Obesity    Thrombocytopenia ([place]) 11/2012   Past Surgical History:  Procedure Laterality Date   ANTERIOR AND POSTERIOR REPAIR N/A 02/28/2021   Procedure: ANTERIOR (CYSTOCELE) AND POSTERIOR REPAIR (RECTOCELE);  Surgeon: Cheri Fowler, MD;  Location: [facility];  Service: Gynecology;  Laterality: N/A;   BIOPSY  01/09/2023   Procedure: BIOPSY;  Surgeon: Irving Copas., MD;  Location: [facility];  Service: Gastroenterology;;   BREAST BIOPSY Left 09/09/2016   BREAST LUMPECTOMY  right   BREAST LUMPECTOMY     BREAST REDUCTION SURGERY  2005   CATARACT EXTRACTION, BILATERAL  2014   CHOLECYSTECTOMY N/A 03/30/2013   Procedure: LAPAROSCOPIC CHOLECYSTECTOMY WITH INTRAOPERATIVE CHOLANGIOGRAM;  Surgeon: Shann Medal, MD;  Location: [facility];  Service: General;  Laterality: N/A;   COLONOSCOPY  2021   CYSTOSCOPY N/A 02/28/2021   Procedure: Consuela Mimes;  Surgeon: Cheri Fowler, MD;  Location: [place];  Service: Gynecology;  Laterality: N/A;   CYSTOSCOPY W/ RETROGRADES Left 02/28/2021   Procedure: CYSTOSCOPY WITH RETROGRADE PYELOGRAM;  Surgeon: Willis Modena,  Sherren Mocha, MD;  Location: [facility];  Service: Gynecology;  Laterality: Left;   ESOPHAGOGASTRODUODENOSCOPY (EGD) WITH PROPOFOL N/A 11/25/2022   Procedure: ESOPHAGOGASTRODUODENOSCOPY (EGD) WITH PROPOFOL;  Surgeon: Carol Ada, MD;  Location: [place];  Service: Gastroenterology;   Laterality: N/A;   ESOPHAGOGASTRODUODENOSCOPY (EGD) WITH PROPOFOL N/A 01/09/2023   Procedure: ESOPHAGOGASTRODUODENOSCOPY (EGD) WITH PROPOFOL;  Surgeon: Rush Landmark Telford Nab., MD;  [facility];  Service: Gastroenterology;  Laterality: N/A;   EUS  01/28/2013   Procedure: UPPER ENDOSCOPIC ULTRASOUND (EUS) LINEAR;  Surgeon: Milus Banister, MD;  Location: [facility];  Service: Endoscopy;  Laterality: N/A;  mishell donalson [phone]   EUS Left 11/25/2022   Procedure: UPPER ENDOSCOPIC ULTRASOUND (EUS) LINEAR;  Surgeon: Carol Ada, MD;  Location: [place];  Service: Gastroenterology;  Laterality: Left;   EUS N/A 01/09/2023   Procedure: UPPER ENDOSCOPIC ULTRASOUND (EUS) LINEAR;  Surgeon: Irving Copas., MD;  [facility];  Service: Gastroenterology;  Laterality: N/A;   EXCISION OF SKIN TAG Right 03/30/2013   Procedure: EXCISION OF SKIN TAG;  Surgeon: Shann Medal, MD;  Location: [facility];  Service: General;  Laterality: Right;   FINE NEEDLE ASPIRATION  11/25/2022   Procedure: FINE NEEDLE ASPIRATION (FNA) LINEAR;  Surgeon: Carol Ada, MD;  Location: [place];  Service: Gastroenterology;;   FINE NEEDLE ASPIRATION N/A 01/09/2023   Procedure: FINE NEEDLE ASPIRATION (FNA) LINEAR;  Surgeon: Irving Copas., MD;  Location: [facility];  Service: Gastroenterology;  Laterality: N/A;   LIVER BIOPSY  03/30/2013   Procedure: LIVER BIOPSY;  Surgeon: Shann Medal, MD;  Location: [facility];  Service: General;;   POLYPECTOMY     REDUCTION MAMMAPLASTY     THYROIDECTOMY, PARTIAL  1972   left   VAGINAL HYSTERECTOMY N/A 02/28/2021   Procedure: HYSTERECTOMY VAGINAL WITH UNILATERAL SALPINGECTOMY;  Surgeon: Cheri Fowler, MD;  Location: [place];  Service: Gynecology;  Laterality: N/A;   Social History:  reports that she has never smoked. She has never used smokeless tobacco. She reports that she does not drink alcohol and does not use  drugs.  Allergies  Allergen Reactions   Lisinopril Cough    Family History  Problem Relation Age of Onset   Diabetes Mother    Stroke Mother    Colon cancer Sister 17   Colon polyps Neg Hx    Esophageal cancer Neg Hx    Rectal cancer Neg Hx    Stomach cancer Neg Hx    Pancreatic cancer Neg Hx    Prostate cancer Neg Hx    Breast cancer Neg Hx     Prior to Admission medications   Medication Sig Start Date End Date Taking? Authorizing Provider  albuterol (VENTOLIN HFA) 108 (90 Base) MCG/ACT inhaler Inhale 2 puffs into the lungs every 6 (six) hours as needed for wheezing or shortness of breath. 08/15/22  Yes Baxley, Cresenciano Lick, MD  ALPRAZolam (XANAX) 0.5 MG tablet TAKE 1 TABLET(0.5 MG) BY MOUTH TWICE DAILY AS NEEDED FOR ANXIETY Patient taking differently: Take 0.5 mg by mouth 2 (two) times daily. 08/12/22  Yes Baxley, Cresenciano Lick, MD  amLODipine (NORVASC) 2.5 MG tablet TAKE 1 TABLET(2.5 MG) BY MOUTH AT BEDTIME 08/04/22  Yes Baxley, Cresenciano Lick, MD  apixaban (ELIQUIS) 5 MG TABS tablet Take 1 tablet (5 mg total) by mouth 2 (two) times daily. 01/11/23  Yes Mansouraty, Telford Nab., MD  cetirizine (ZYRTEC) 10 MG tablet Take 10 mg by mouth daily as needed (allergies).  Yes [provider]  cholecalciferol (VITAMIN D3) 25 MCG (1000 UNIT) tablet Take 1,000 Units by mouth in the morning.   Yes [provider]  HYDROcodone-acetaminophen (NORCO/VICODIN) 5-325 MG tablet Take 1 tablet by mouth every 6 (six) hours as needed. Patient taking differently: Take 1 tablet by mouth every 6 (six) hours as needed for moderate pain or severe pain. 12/27/22  Yes Dorie Rank, MD  loperamide (IMODIUM) 2 MG capsule Take 2-4 mg by mouth 3 (three) times daily as needed for diarrhea or loose stools.   Yes [provider]  losartan-hydrochlorothiazide (HYZAAR) 100-25 MG tablet Take 1 tablet by mouth daily. 12/03/21  Yes Baxley, Cresenciano Lick, MD  metFORMIN (GLUCOPHAGE) 500 MG tablet TAKE 1 TABLET(500 MG) BY MOUTH  TWICE DAILY WITH A MEAL 11/20/22  Yes Baxley, Cresenciano Lick, MD  Multiple Vitamin (MULTIVITAMIN WITH MINERALS) TABS tablet Take 1 tablet by mouth in the morning.   Yes [provider]  pantoprazole (PROTONIX) 40 MG tablet Take 1 tablet (40 mg total) by mouth daily. 12/25/22  Yes Baxley, Cresenciano Lick, MD  Blood Glucose Monitoring Suppl (TRUE METRIX METER) w/Device KIT USE AS DIRECTED 07/30/21   Elby Showers, MD  docusate sodium (COLACE) 100 MG capsule Take 1 capsule (100 mg total) by mouth daily. Patient taking differently: Take 100 mg by mouth every 6 (six) hours as needed (with norco for constipation prevention.). 12/27/22   Dorie Rank, MD  glucose blood (TRUE METRIX BLOOD GLUCOSE TEST) test strip TEST BLOOD SUGAR TWICE DAILY 10/10/22   Elby Showers, MD  Lancets (FREESTYLE) lancets CHECK GLUCOSE TWICE A DAY 01/26/20   Elby Showers, MD    Physical Exam: Vitals:   01/14/23 0545 01/14/23 0729 01/14/23 0800 01/14/23 0830  BP: 134/78  125/76 116/69  Pulse: 78  77 78  Resp: '12  14 15  '$ Temp:  (!) 97.5 F (36.4 C)    TempSrc:  Oral    SpO2: 92%  95% 93%  Height:       General exam: Appears calm and comfortable  Respiratory system: Clear to auscultation.   Tachypnea present Cardiovascular system: S1 & S2 heard, RRR. No JVD, murmurs,  Gastrointestinal system: Abdomen is soft, mild generalized tenderness present Central nervous system: Alert and oriented. No focal neurological deficits. Extremities: Symmetric 5 x 5 power. Skin: Bruising seen over the arms and right leg Psychiatry: . Mood & affect appropriate.   Data Reviewed: Results for orders placed or performed during the hospital encounter of 01/13/23 (from the past 24 hour(s))  POC CBG, ED     Status: Abnormal   Collection Time: 01/13/23  9:21 PM  Result Value Ref Range   Glucose-Capillary 199 (H) 70 - 99 mg/dL  CBC with Differential     Status: Abnormal   Collection Time: 01/13/23  9:34 PM  Result Value Ref Range   WBC 9.5 4.0 - 10.5  K/uL   RBC 3.34 (L) 3.87 - 5.11 MIL/uL   Hemoglobin 9.3 (L) 12.0 - 15.0 g/dL   HCT 28.2 (L) 36.0 - 46.0 %   MCV 84.4 80.0 - 100.0 fL   MCH 27.8 26.0 - 34.0 pg   MCHC 33.0 30.0 - 36.0 g/dL   RDW 16.5 (H) 11.5 - 15.5 %   Platelets 44 (L) 150 - 400 K/uL   nRBC 0.0 0.0 - 0.2 %   Neutrophils Relative % 74 %   Neutro Abs 7.1 1.7 - 7.7 K/uL   Lymphocytes Relative 16 %  Lymphs Abs 1.5 0.7 - 4.0 K/uL   Monocytes Relative 8 %   Monocytes Absolute 0.7 0.1 - 1.0 K/uL   Eosinophils Relative 1 %   Eosinophils Absolute 0.1 0.0 - 0.5 K/uL   Basophils Relative 0 %   Basophils Absolute 0.0 0.0 - 0.1 K/uL   Immature Granulocytes 1 %   Abs Immature Granulocytes 0.05 0.00 - 0.07 K/uL  Comprehensive metabolic panel     Status: Abnormal   Collection Time: 01/13/23  9:34 PM  Result Value Ref Range   Sodium 135 135 - 145 mmol/L   Potassium 3.4 (L) 3.5 - 5.1 mmol/L   Chloride 98 98 - 111 mmol/L   CO2 23 22 - 32 mmol/L   Glucose, Bld 193 (H) 70 - 99 mg/dL   BUN 13 8 - 23 mg/dL   Creatinine, Ser 1.07 (H) 0.44 - 1.00 mg/dL   Calcium 8.8 (L) 8.9 - 10.3 mg/dL   Total Protein 7.2 6.5 - 8.1 g/dL   Albumin 3.5 3.5 - 5.0 g/dL   AST 37 15 - 41 U/L   ALT 25 0 - 44 U/L   Alkaline Phosphatase 80 38 - 126 U/L   Total Bilirubin 0.7 0.3 - 1.2 mg/dL   GFR, Estimated 53 (L) >60 mL/min   Anion gap 14 5 - 15  Lipase, blood     Status: None   Collection Time: 01/13/23  9:34 PM  Result Value Ref Range   Lipase 27 11 - 51 U/L  Troponin I (High Sensitivity)     Status: None   Collection Time: 01/13/23  9:34 PM  Result Value Ref Range   Troponin I (High Sensitivity) 6 <18 ng/L  Urinalysis, Routine w reflex microscopic Urine, Clean Catch     Status: Abnormal   Collection Time: 01/13/23  9:39 PM  Result Value Ref Range   Color, Urine YELLOW YELLOW   APPearance CLEAR CLEAR   Specific Gravity, Urine 1.016 1.005 - 1.030   pH 6.0 5.0 - 8.0   Glucose, UA NEGATIVE NEGATIVE mg/dL   Hgb urine dipstick NEGATIVE NEGATIVE    Bilirubin Urine NEGATIVE NEGATIVE   Ketones, ur NEGATIVE NEGATIVE mg/dL   Protein, ur 30 (A) NEGATIVE mg/dL   Nitrite NEGATIVE NEGATIVE   Leukocytes,Ua NEGATIVE NEGATIVE   RBC / HPF 0-5 0 - 5 RBC/hpf   WBC, UA 0-5 0 - 5 WBC/hpf   Bacteria, UA NONE SEEN NONE SEEN   Squamous Epithelial / HPF 0-5 0 - 5 /HPF   Mucus PRESENT    Hyaline Casts, UA PRESENT   Resp panel by RT-PCR (RSV, Flu A&B, Covid) Anterior Nasal Swab     Status: None   Collection Time: 01/13/23  9:39 PM   Specimen: Anterior Nasal Swab  Result Value Ref Range   SARS Coronavirus 2 by RT PCR NEGATIVE NEGATIVE   Influenza A by PCR NEGATIVE NEGATIVE   Influenza B by PCR NEGATIVE NEGATIVE   Resp Syncytial Virus by PCR NEGATIVE NEGATIVE  CBC     Status: Abnormal   Collection Time: 01/13/23 10:27 PM  Result Value Ref Range   WBC 8.6 4.0 - 10.5 K/uL   RBC 3.21 (L) 3.87 - 5.11 MIL/uL   Hemoglobin 8.9 (L) 12.0 - 15.0 g/dL   HCT 26.8 (L) 36.0 - 46.0 %   MCV 83.5 80.0 - 100.0 fL   MCH 27.7 26.0 - 34.0 pg   MCHC 33.2 30.0 - 36.0 g/dL   RDW 16.3 (H) 11.5 -  15.5 %   Platelets 40 (L) 150 - 400 K/uL   nRBC 0.0 0.0 - 0.2 %  POC occult blood, ED Provider will collect     Status: None   Collection Time: 01/14/23 12:37 AM  Result Value Ref Range   Fecal Occult Bld NEGATIVE NEGATIVE     Assessment and Plan:  Shortness of breath with cough and CT angiogram showing groundglass opacities suspicious for atypical infection. Will admit for possible pneumonia and empirically start her on IV Rocephin and IV Zithromax. Nasal cannula oxygen to keep sats greater than 90% Check urine strep and legionella antigen.  RSV, influenza, COVID PCR negative Check for RVP     Soft tissue mass in the head of the pancreas suspicious for malignancy S/p EGD/EUS on January 18 by Dr. Rush Landmark Results are pending. Will request oncology evaluation.     Type 2 diabetes mellitus Non-insulin-dependent Last A1c 6.7 in December 2023 Continue  with sliding scale insulin    Anemia of chronic disease Hemoglobin between 8-9 Stool for occult blood is negative.     Thrombocytopenia probably secondary to liver disease Platelet count around 40,000's Some bruising over the extremities Check immature platelet fraction    History of pulmonary embolism  was on Eliquis , which is on hold for now for epistaxis and thrombocytopenia  Epistaxis:  Appears to have slowed down.  Continue to monitor.  If no epistaxis today, should be okay to resume Eliquis in am.      Advance Care Planning:   Code Status: Full Code   Consults: oncology.    Family Communication: none at bedside.   Severity of Illness: The appropriate patient status for this patient is INPATIENT. Inpatient status is judged to be reasonable and necessary in order to provide the required intensity of service to ensure the patient's safety. The patient's presenting symptoms, physical exam findings, and initial radiographic and laboratory data in the context of their chronic comorbidities is felt to place them at high risk for further clinical deterioration. Furthermore, it is not anticipated that the patient will be medically stable for discharge from the hospital within 2 midnights of admission.   * I certify that at the point of admission it is my clinical judgment that the patient will require inpatient hospital care spanning beyond 2 midnights from the point of admission due to high intensity of service, high risk for further deterioration and high frequency of surveillance required.*  Author: Hosie Poisson, MD 01/14/2023 9:54 AM  For on call review [email or web address].

## 2023-01-14 NOTE — Consult Note (Signed)
[place]  [phone]   HEMATOLOGY ONCOLOGY INPATIENT CONSULTATION   Victoria [name]  DOB: 04-15-1941  MR#: [id number]  CSN#: [id number]    Requesting Physician: [facility]  Patient Care Team: Elby Showers, MD as PCP - General (Internal Medicine) Inda Castle, MD (Inactive) as Consulting Physician (Gastroenterology)  Reason for consult: Pancreatic adenocarcinoma  History of present illness:   82 year old female, with past medical history of type 2 diabetes, hepatitis C with liver cirrhosis, history of right breast cancer, who presents to emergency room for worsening abdominal pain.  Renal pancreatic mass biopsy showed adenocarcinoma, I was consulted to see patient.  Patient is a poor historian, states that she has had intermittent abdominal pain for over a year, but has been worse lately, probably in the past few months.  She also presents recent nonproductive cough, dyspnea on exertion and epistaxis.  She denies any nausea, vomiting, change of bowel habit, or hematochezia.  She was admitted to hospital on November 21, 2022 for left-sided pleuritic chest pain.  Workup showed a submassive PE.  She was started on anticoagulation.  CT scan also showed a pancreatic mass, and metastatic adenopathy.  She underwent EUS with fine-needle biopsy of the pancreas mass, November 26, 2023 by Dr. Benson Norway, but it was not diagnostic.  She followed with her primary care physician after hospital discharge, and referred back to GI.  She underwent EUS biopsy of the pancreatic mass by Dr. Rush Landmark on January 09, 2023, and the pathology result came back today. She states she has lost about 50 pounds in the past 6 months.  She lives alone, has 3 children, one of them lives in [place].  She was brought into the emergency room by her brother today.  No family at the bedside when I saw her.   MEDICAL HISTORY:  Past Medical History:  Diagnosis Date   Abnormal finding on  Pap smear    Allergy    SEASONAL   Anxiety    Breast cancer ([place]) 2000   right breast lumptectomy, radiation done   Cirrhosis ([place]) 11/2012   resolved per pt   Diabetes mellitus type 2, controlled ([place])    Hepatitis C    took tx for    Hypertension    Obesity    Thrombocytopenia ([place]) 11/2012    SURGICAL HISTORY: Past Surgical History:  Procedure Laterality Date   ANTERIOR AND POSTERIOR REPAIR N/A 02/28/2021   Procedure: ANTERIOR (CYSTOCELE) AND POSTERIOR REPAIR (RECTOCELE);  Surgeon: Cheri Fowler, MD;  Location: [facility];  Service: Gynecology;  Laterality: N/A;   BIOPSY  01/09/2023   Procedure: BIOPSY;  Surgeon: Irving Copas., MD;  Location: [facility];  Service: Gastroenterology;;   BREAST BIOPSY Left 09/09/2016   BREAST LUMPECTOMY  right   BREAST LUMPECTOMY     BREAST REDUCTION SURGERY  2005   CATARACT EXTRACTION, BILATERAL  2014   CHOLECYSTECTOMY N/A 03/30/2013   Procedure: LAPAROSCOPIC CHOLECYSTECTOMY WITH INTRAOPERATIVE CHOLANGIOGRAM;  Surgeon: Shann Medal, MD;  Location: [facility];  Service: General;  Laterality: N/A;   COLONOSCOPY  2021   CYSTOSCOPY N/A 02/28/2021   Procedure: Consuela Mimes;  Surgeon: Cheri Fowler, MD;  Location: [facility];  Service: Gynecology;  Laterality: N/A;   CYSTOSCOPY W/ RETROGRADES Left 02/28/2021   Procedure: CYSTOSCOPY WITH RETROGRADE PYELOGRAM;  Surgeon: Cheri Fowler, MD;  Location: [place];  Service: Gynecology;  Laterality: Left;   ESOPHAGOGASTRODUODENOSCOPY (EGD) WITH PROPOFOL N/A 11/25/2022  Procedure: ESOPHAGOGASTRODUODENOSCOPY (EGD) WITH PROPOFOL;  Surgeon: Carol Ada, MD;  Location: [place];  Service: Gastroenterology;  Laterality: N/A;   ESOPHAGOGASTRODUODENOSCOPY (EGD) WITH PROPOFOL N/A 01/09/2023   Procedure: ESOPHAGOGASTRODUODENOSCOPY (EGD) WITH PROPOFOL;  Surgeon: Rush Landmark Telford Nab., MD;  [facility];  Service: Gastroenterology;   Laterality: N/A;   EUS  01/28/2013   Procedure: UPPER ENDOSCOPIC ULTRASOUND (EUS) LINEAR;  Surgeon: Milus Banister, MD;  [facility];  Service: Endoscopy;  Laterality: N/A;  ithzel fedorchak [phone]   EUS Left 11/25/2022   Procedure: UPPER ENDOSCOPIC ULTRASOUND (EUS) LINEAR;  Surgeon: Carol Ada, MD;  Location: [place];  Service: Gastroenterology;  Laterality: Left;   EUS N/A 01/09/2023   Procedure: UPPER ENDOSCOPIC ULTRASOUND (EUS) LINEAR;  Surgeon: Irving Copas., MD;  [facility];  Service: Gastroenterology;  Laterality: N/A;   EXCISION OF SKIN TAG Right 03/30/2013   Procedure: EXCISION OF SKIN TAG;  Surgeon: Shann Medal, MD;  Location: [facility];  Service: General;  Laterality: Right;   FINE NEEDLE ASPIRATION  11/25/2022   Procedure: FINE NEEDLE ASPIRATION (FNA) LINEAR;  Surgeon: Carol Ada, MD;  Location: [facility];  Service: Gastroenterology;;   FINE NEEDLE ASPIRATION N/A 01/09/2023   Procedure: FINE NEEDLE ASPIRATION (FNA) LINEAR;  Surgeon: Irving Copas., MD;  Location: [facility];  Service: Gastroenterology;  Laterality: N/A;   LIVER BIOPSY  03/30/2013   Procedure: LIVER BIOPSY;  Surgeon: Shann Medal, MD;  Location: [facility];  Service: General;;   POLYPECTOMY     REDUCTION MAMMAPLASTY     THYROIDECTOMY, PARTIAL  1972   left   VAGINAL HYSTERECTOMY N/A 02/28/2021   Procedure: HYSTERECTOMY VAGINAL WITH UNILATERAL SALPINGECTOMY;  Surgeon: Cheri Fowler, MD;  Location: [facility];  Service: Gynecology;  Laterality: N/A;    SOCIAL HISTORY: Social History   Socioeconomic History   Marital status: Divorced    Spouse name: Not on file   Number of children: 3   Years of education: Not on file   Highest education level: Not on file  Occupational History   Not on file  Tobacco Use   Smoking status: Never   Smokeless tobacco: Never  Vaping Use   Vaping Use: Never used  Substance and Sexual Activity    Alcohol use: No   Drug use: No   Sexual activity: Not Currently  Other Topics Concern   Not on file  Social History Narrative   Lives alone   Caffeine use: none   Right handed    Social Determinants of Health   Financial Resource Strain: Medium Risk (12/03/2021)   Overall Financial Resource Strain (CARDIA)    Difficulty of Paying Living Expenses: Somewhat hard  Food Insecurity: No Food Insecurity (11/22/2022)   Hunger Vital Sign    Worried About Running Out of Food in the Last Year: Never true    Ran Out of Food in the Last Year: Never true  Transportation Needs: No Transportation Needs (11/22/2022)   PRAPARE - Hydrologist (Medical): No    Lack of Transportation (Non-Medical): No  Physical Activity: Sufficiently Active (12/03/2021)   Exercise Vital Sign    Days of Exercise per Week: 4 days    Minutes of Exercise per Session: 60 min  Stress: No Stress Concern Present (12/03/2021)   [place]    Feeling of Stress : Only a little  Social Connections: Moderately Isolated (12/03/2021)   Social Connection  and Isolation Panel [NHANES]    Frequency of Communication with Friends and Family: More than three times a week    Frequency of Social Gatherings with Friends and Family: More than three times a week    Attends Religious Services: Never    Marine scientist or Organizations: Yes    Attends Archivist Meetings: Never    Marital Status: Divorced  Human resources officer Violence: Not At Risk (11/22/2022)   Humiliation, Afraid, Rape, and Kick questionnaire    Fear of Current or Ex-Partner: No    Emotionally Abused: No    Physically Abused: No    Sexually Abused: No    FAMILY HISTORY: Family History  Problem Relation Age of Onset   Diabetes Mother    Stroke Mother    Colon cancer Sister 77   Colon polyps Neg Hx    Esophageal cancer Neg Hx    Rectal cancer Neg Hx     Stomach cancer Neg Hx    Pancreatic cancer Neg Hx    Prostate cancer Neg Hx    Breast cancer Neg Hx     ALLERGIES:  is allergic to lisinopril.  MEDICATIONS:  Current Facility-Administered Medications  Medication Dose Route Frequency Provider Last Rate Last Admin   acetaminophen (TYLENOL) tablet 650 mg  650 mg Oral Q6H PRN Hosie Poisson, MD       Or   acetaminophen (TYLENOL) suppository 650 mg  650 mg Rectal Q6H PRN Hosie Poisson, MD       albuterol (PROVENTIL) (2.5 MG/3ML) 0.083% nebulizer solution 2.5 mg  2.5 mg Nebulization Q6H PRN Hosie Poisson, MD       ALPRAZolam [name]) tablet 0.5 mg  0.5 mg Oral BID Hosie Poisson, MD   0.5 mg at 01/14/23 1152   [START ON 01/15/2023] azithromycin (ZITHROMAX) 500 mg in sodium chloride 0.9 % 250 mL IVPB  500 mg Intravenous Q24H Hosie Poisson, MD       [START ON 01/15/2023] cefTRIAXone (ROCEPHIN) 1 g in sodium chloride 0.9 % 100 mL IVPB  1 g Intravenous Q24H Hosie Poisson, MD       docusate sodium (COLACE) capsule 100 mg  100 mg Oral Daily Hosie Poisson, MD   100 mg at 01/14/23 1152   HYDROcodone-acetaminophen (NORCO/VICODIN) 5-325 MG per tablet 1 tablet  1 tablet Oral Q6H PRN Hosie Poisson, MD   1 tablet at 01/14/23 1750   insulin aspart (novoLOG) injection 0-9 Units  0-9 Units Subcutaneous TID WC Hosie Poisson, MD   1 Units at 01/14/23 1703   morphine (PF) 2 MG/ML injection 1 mg  1 mg Intravenous Q4H PRN Hosie Poisson, MD   1 mg at 01/14/23 2012   multivitamin with minerals tablet 1 tablet  1 tablet Oral q AM Hosie Poisson, MD   1 tablet at 01/14/23 1152   ondansetron (ZOFRAN) tablet 4 mg  4 mg Oral Q6H PRN Hosie Poisson, MD       Or   ondansetron (ZOFRAN) injection 4 mg  4 mg Intravenous Q6H PRN Hosie Poisson, MD       pantoprazole (PROTONIX) EC tablet 40 mg  40 mg Oral Daily Vladimir Crofts, PA-C   40 mg at 01/14/23 1102    REVIEW OF SYSTEMS:   Constitutional: Denies fevers, chills or abnormal night sweats, (+) fatigue ad weight loss  Eyes:  Denies blurriness of vision, double vision or watery eyes Ears, nose, mouth, throat, and face: Denies mucositis or sore throat Respiratory: (+) dry cough  and dyspnea, no wheezes Cardiovascular: Denies palpitation, chest discomfort or lower extremity swelling Gastrointestinal:  see HPI  Skin: Denies abnormal skin rashes Lymphatics: Denies new lymphadenopathy or easy bruising Neurological:Denies numbness, tingling or new weaknesses Behavioral/Psych: Mood is stable, no new changes  All other systems were reviewed with the patient and are negative.  PHYSICAL EXAMINATION: ECOG PERFORMANCE STATUS: 2 - Symptomatic, <50% confined to bed  Vitals:   01/14/23 1533 01/14/23 1652  BP: 134/85 131/77  Pulse: 84 90  Resp: 16   Temp:  98.4 F (36.9 C)  SpO2: 100% 98%   There were no vitals filed for this visit.  GENERAL:alert, no distress and comfortable SKIN: skin color, texture, turgor are normal, no rashes or significant lesions EYES: normal, conjunctiva are pink and non-injected, sclera clear OROPHARYNX:no exudate, no erythema and lips, buccal mucosa, and tongue normal  NECK: supple, thyroid normal size, non-tender, without nodularity LYMPH:  no palpable lymphadenopathy in the cervical, axillary or inguinal LUNGS: clear to auscultation and percussion with normal breathing effort HEART: regular rate & rhythm and no murmurs and no lower extremity edema ABDOMEN:abdomen soft, non-tender and normal bowel sounds Musculoskeletal:no cyanosis of digits and no clubbing  PSYCH: alert & oriented x 3 with fluent speech NEURO: no focal motor/sensory deficits  LABORATORY DATA:  I have reviewed the data as listed Lab Results  Component Value Date   WBC 8.6 01/13/2023   HGB 8.9 (L) 01/13/2023   HCT 26.8 (L) 01/13/2023   MCV 83.5 01/13/2023   PLT 40 (L) 01/13/2023   Recent Labs    11/21/22 1018 11/22/22 0457 11/25/22 0102 12/27/22 0740 01/13/23 2134  NA 137   < > 135 132* 135  K 4.2   < >  3.6 3.7 3.4*  CL 98   < > 101 95* 98  CO2 23   < > '24 24 23  '$ GLUCOSE 193*   < > 155* 181* 193*  BUN 23   < > '19 11 13  '$ CREATININE 1.11*   < > 1.19* 0.91 1.07*  CALCIUM 9.2   < > 8.7* 8.9 8.8*  GFRNONAA 51*   < > 47* >60 53*  PROT 7.4   < > 6.7 7.3 7.2  ALBUMIN 3.7   < > 3.3* 3.4* 3.5  AST 20   < > 19 20 37  ALT 14   < > '16 13 25  '$ ALKPHOS 64   < > 60 57 80  BILITOT 0.5   < > 0.4 0.8 0.7  BILIDIR 0.1  --   --   --   --   IBILI 0.4  --   --   --   --    < > = values in this interval not displayed.    RADIOGRAPHIC STUDIES: I have personally reviewed the radiological images as listed and agreed with the findings in the report. CT Angio Chest PE W and/or Wo Contrast  Result Date: 01/14/2023 CLINICAL DATA:  Abdominal pain. Also concern for pulmonary embolism. EXAM: CT ANGIOGRAPHY CHEST CT ABDOMEN AND PELVIS WITH CONTRAST TECHNIQUE: Multidetector CT imaging of the chest was performed using the standard protocol during bolus administration of intravenous contrast. Multiplanar CT image reconstructions and MIPs were obtained to evaluate the vascular anatomy. Multidetector CT imaging of the abdomen and pelvis was performed using the standard protocol during bolus administration of intravenous contrast. RADIATION DOSE REDUCTION: This exam was performed according to the departmental dose-optimization program which includes automated exposure control, adjustment of the  mA and/or kV according to patient size and/or use of iterative reconstruction technique. CONTRAST:  120m OMNIPAQUE IOHEXOL 350 MG/ML SOLN COMPARISON:  CT abdomen pelvis dated 12/27/2022 and chest CT dated 11/21/2022. FINDINGS: Evaluation is limited due to respiratory motion and streak artifact caused by the patient's arms. CTA CHEST FINDINGS Cardiovascular: There is no cardiomegaly or pericardial effusion. Mild atherosclerotic calcification of the thoracic aorta. No aneurysmal dilatation or dissection. Evaluation of the pulmonary arteries is  limited due to respiratory motion and suboptimal visualization of the peripheral branches. No obvious large central pulmonary artery embolus identified. Mediastinum/Nodes: Top-normal bilateral hilar lymph nodes measure 1 cm short axis. The esophagus is grossly unremarkable. No mediastinal fluid collection. Lungs/Pleura: Scattered clusters of ground-glass nodular densities throughout the lungs suspicious for atypical infection. Metastatic disease is not excluded clinical correlation is recommended. There is no pleural effusion or pneumothorax. The central airways are patent. Musculoskeletal: Degenerative changes of the spine and osteopenia. No acute osseous pathology. Review of the MIP images confirms the above findings. CT ABDOMEN and PELVIS FINDINGS No intra-abdominal free air or free fluid. Hepatobiliary: Cirrhosis. Mild biliary dilatation versus periportal edema. Cholecystectomy. Pancreas: Ill-defined soft tissue mass in the region of the head of the pancreas measuring approximately 4.9 x 3.5 cm (22/4). There is atrophy of the body of the pancreas and dilatation of the main pancreatic duct. No active inflammatory changes. Spleen: Normal in size without focal abnormality. Adrenals/Urinary Tract: The adrenal glands unremarkable. Small bilateral renal cysts. There is no hydronephrosis on either side. There is symmetric enhancement and excretion of contrast by both kidneys. The visualized ureters and urinary bladder appear unremarkable. Stomach/Bowel: Severe colonic diverticulosis without active inflammatory changes. There is no bowel obstruction active inflammation. The appendix is normal. Vascular/Lymphatic: Moderate aortoiliac atherosclerotic disease. The IVC is unremarkable. No portal venous gas. Retroperitoneal adenopathy measures 13 mm to the left of the aorta, 17 mm in the periportal region, and 19 mm in the gastrohepatic vision. Reproductive: Hysterectomy.  No adnexal masses. Other: Small fat containing  umbilical and supraumbilical hernia. Musculoskeletal: Degenerative changes of the spine. No acute osseous pathology. Review of the MIP images confirms the above findings. IMPRESSION: 1. No CT evidence of central pulmonary artery embolus. 2. Scattered clusters of ground-glass nodular densities throughout the lungs may represent atypical infection or metastatic disease. 3. Ill-defined soft tissue mass in the region of the head of the pancreas with atrophy of the body of the pancreas and dilatation of the main pancreatic duct. Findings are concerning for pancreatic malignancy. Further evaluation with MRI with and without contrast recommended. 4. Retroperitoneal adenopathy. 5. Cirrhosis. 6. Severe colonic diverticulosis without active inflammatory changes. No bowel obstruction. Normal appendix. 7.  Aortic Atherosclerosis (ICD10-I70.0). Electronically Signed   By: AAnner CreteM.D.   On: 01/14/2023 00:04   CT ABDOMEN PELVIS W CONTRAST  Result Date: 01/14/2023 CLINICAL DATA:  Abdominal pain. Also concern for pulmonary embolism. EXAM: CT ANGIOGRAPHY CHEST CT ABDOMEN AND PELVIS WITH CONTRAST TECHNIQUE: Multidetector CT imaging of the chest was performed using the standard protocol during bolus administration of intravenous contrast. Multiplanar CT image reconstructions and MIPs were obtained to evaluate the vascular anatomy. Multidetector CT imaging of the abdomen and pelvis was performed using the standard protocol during bolus administration of intravenous contrast. RADIATION DOSE REDUCTION: This exam was performed according to the departmental dose-optimization program which includes automated exposure control, adjustment of the mA and/or kV according to patient size and/or use of iterative reconstruction technique. CONTRAST:  1044mOMNIPAQUE IOHEXOL  350 MG/ML SOLN COMPARISON:  CT abdomen pelvis dated 12/27/2022 and chest CT dated 11/21/2022. FINDINGS: Evaluation is limited due to respiratory motion and streak  artifact caused by the patient's arms. CTA CHEST FINDINGS Cardiovascular: There is no cardiomegaly or pericardial effusion. Mild atherosclerotic calcification of the thoracic aorta. No aneurysmal dilatation or dissection. Evaluation of the pulmonary arteries is limited due to respiratory motion and suboptimal visualization of the peripheral branches. No obvious large central pulmonary artery embolus identified. Mediastinum/Nodes: Top-normal bilateral hilar lymph nodes measure 1 cm short axis. The esophagus is grossly unremarkable. No mediastinal fluid collection. Lungs/Pleura: Scattered clusters of ground-glass nodular densities throughout the lungs suspicious for atypical infection. Metastatic disease is not excluded clinical correlation is recommended. There is no pleural effusion or pneumothorax. The central airways are patent. Musculoskeletal: Degenerative changes of the spine and osteopenia. No acute osseous pathology. Review of the MIP images confirms the above findings. CT ABDOMEN and PELVIS FINDINGS No intra-abdominal free air or free fluid. Hepatobiliary: Cirrhosis. Mild biliary dilatation versus periportal edema. Cholecystectomy. Pancreas: Ill-defined soft tissue mass in the region of the head of the pancreas measuring approximately 4.9 x 3.5 cm (22/4). There is atrophy of the body of the pancreas and dilatation of the main pancreatic duct. No active inflammatory changes. Spleen: Normal in size without focal abnormality. Adrenals/Urinary Tract: The adrenal glands unremarkable. Small bilateral renal cysts. There is no hydronephrosis on either side. There is symmetric enhancement and excretion of contrast by both kidneys. The visualized ureters and urinary bladder appear unremarkable. Stomach/Bowel: Severe colonic diverticulosis without active inflammatory changes. There is no bowel obstruction active inflammation. The appendix is normal. Vascular/Lymphatic: Moderate aortoiliac atherosclerotic disease. The  IVC is unremarkable. No portal venous gas. Retroperitoneal adenopathy measures 13 mm to the left of the aorta, 17 mm in the periportal region, and 19 mm in the gastrohepatic vision. Reproductive: Hysterectomy.  No adnexal masses. Other: Small fat containing umbilical and supraumbilical hernia. Musculoskeletal: Degenerative changes of the spine. No acute osseous pathology. Review of the MIP images confirms the above findings. IMPRESSION: 1. No CT evidence of central pulmonary artery embolus. 2. Scattered clusters of ground-glass nodular densities throughout the lungs may represent atypical infection or metastatic disease. 3. Ill-defined soft tissue mass in the region of the head of the pancreas with atrophy of the body of the pancreas and dilatation of the main pancreatic duct. Findings are concerning for pancreatic malignancy. Further evaluation with MRI with and without contrast recommended. 4. Retroperitoneal adenopathy. 5. Cirrhosis. 6. Severe colonic diverticulosis without active inflammatory changes. No bowel obstruction. Normal appendix. 7.  Aortic Atherosclerosis (ICD10-I70.0). Electronically Signed   By: Anner Crete M.D.   On: 01/14/2023 00:04   CT ABDOMEN PELVIS W CONTRAST  Result Date: 12/27/2022 CLINICAL DATA:  Acute severe abdominal pain EXAM: CT ABDOMEN AND PELVIS WITH CONTRAST TECHNIQUE: Multidetector CT imaging of the abdomen and pelvis was performed using the standard protocol following bolus administration of intravenous contrast. RADIATION DOSE REDUCTION: This exam was performed according to the departmental dose-optimization program which includes automated exposure control, adjustment of the mA and/or kV according to patient size and/or use of iterative reconstruction technique. CONTRAST:  53m OMNIPAQUE IOHEXOL 350 MG/ML SOLN COMPARISON:  Previous CT done on 06/07/2013 and MR abdomen done on 11/21/2022 FINDINGS: Lower chest: There are numerous new small patchy nodular densities in both  lower lung fields. There is ectasia of bronchi in the lower lung fields. There is small Bochdalek's hernia in right hemidiaphragm containing fat. Hepatobiliary: There  is nodularity in liver surface. No focal abnormalities are seen. Surgical clips are seen in gallbladder fossa. There is no dilation of bile ducts. Pancreas: There is marked dilation of pancreatic duct in the body and tail measuring up to 1.4 cm. There is abrupt change in caliber of pancreatic duct in the head of the pancreas without significant dilation. There is marked parenchymal atrophy in the body and tail of pancreas. There is patchy attenuation in the head of the pancreas without discrete measurable nodule/mass. Spleen: Unremarkable. Adrenals/Urinary Tract: Adrenals are unremarkable. There is no hydronephrosis. There are no renal or ureteral stones. There are few smooth marginated low-density lesions in both kidneys measuring up to 2 cm in size suggesting renal cysts. No follow-up imaging is recommended. Ureters are not dilated. Urinary bladder is not distended. Stomach/Bowel: Stomach is not distended. Small bowel loops are unremarkable. Appendix is not dilated. Tip of appendix is noted in right pelvic cavity slightly below the level of pelvic inlet. Multiple diverticula are seen in colon. There is no evidence of focal acute diverticulitis. Vascular/Lymphatic: Scattered arterial calcifications are seen. There are abnormally enlarged lymph nodes adjacent to put a bodies, adjacent to head of pancreas and retroperitoneum measuring up to 1.5 cm in short axis. Reproductive: Uterus is not seen. Other: There is no ascites or pneumoperitoneum. Umbilical hernia containing fat is seen. Bilateral inguinal hernias containing fat are noted. Musculoskeletal: Degenerative changes are noted in lower thoracic spine and lumbar spine. IMPRESSION: There is no evidence of intestinal obstruction or pneumoperitoneum. There is no hydronephrosis. Appendix is not dilated.  There is marked dilation of pancreatic duct in the body and tail with abrupt change in caliber in the neck. Possibility of an obstructing neoplastic process in pancreas should be considered. There are abnormally enlarged lymph nodes adjacent to porta hepatis, adjacent to the head of the pancreas, lesser curvature aspect of stomach and retroperitoneum suggesting possible metastatic lymphadenopathy. There are numerous patchy nodular densities of varying sizes scattered throughout the visualized lower lung fields. This finding may suggest pulmonary metastatic disease. Less likely possibility would be multifocal pneumonia. There is nodularity in liver surface suggesting cirrhosis. No focal abnormalities are seen in liver. Diverticulosis of colon without signs of focal acute diverticulitis. Other findings as described in the body of the report. There is marked dilation of pancreatic duct in the body and tail with abrupt change in caliber in the neck of the pancreas. Possibility of a benign or malignant stricture due to neoplastic process is not excluded. Electronically Signed   By: Elmer Picker M.D.   On: 12/27/2022 13:57    ASSESSMENT & PLAN:  82 year old female with past medical history of diabetes, hypertension, breast cancer, hepatitis C and liver cirrhosis, presented with abdominal pain, dry cough and dyspnea.  Pancreatic adenocarcinoma with retroperitoneal adenopathy and diffuse lung nodules, likely metastatic 2.  Liver cirrhosis, secondary to hepatitis C, treated. 3.  Type 2 diabetes 4.  History of breast cancer 5.  History of PE in November 2023, likely related to her malignancy.  Recommendations: -I have personally reviewed her recent CT scan images, and discussed the imaging findings, along with endoscopy findings and fine-needle biopsy results with patient in detail.  She is quite anxious, almost restless, not sure how much she understands, but I reassured her that her cancer is treatable,  also probably not curable.  I encouraged her to discuss with her family. -Given her advanced age, multiple comorbidities, limited social support, she is not a good candidate for  intensive chemotherapy.  I would offer single agent gemcitabine, if she tolerated well, may consider adding Abraxane. -Due to her history of breast cancer, I will arrange genetic testing, to rule out cancer syndrome. -She will be admitted for pain control -Continue anticoagulation -I plan to see her back after discharge.   All questions were answered. The patient knows to call the clinic with any problems, questions or concerns.      Truitt Merle, MD 01/14/2023

## 2023-01-14 NOTE — Progress Notes (Signed)
START ON PATHWAY REGIMEN - Pancreatic Adenocarcinoma     A cycle is every 28 days:     Gemcitabine   **Always confirm dose/schedule in your pharmacy ordering system**  Patient Characteristics: Metastatic Disease, First Line, PS  ?  2, BRCA1/2 and PALB2 Mutation Absent/Unknown Therapeutic Status: Metastatic Disease Line of Therapy: First Line ECOG Performance Status: 2 BRCA1/2 Mutation Status: Awaiting Test Results PALB2 Mutation Status: Awaiting Test Results Intent of Therapy: Non-Curative / Palliative Intent, Discussed with Patient

## 2023-01-15 DIAGNOSIS — R0902 Hypoxemia: Secondary | ICD-10-CM | POA: Diagnosis not present

## 2023-01-15 DIAGNOSIS — J189 Pneumonia, unspecified organism: Secondary | ICD-10-CM | POA: Diagnosis not present

## 2023-01-15 LAB — DIFFERENTIAL
Abs Immature Granulocytes: 0.03 10*3/uL (ref 0.00–0.07)
Basophils Absolute: 0 10*3/uL (ref 0.0–0.1)
Basophils Relative: 1 %
Eosinophils Absolute: 0.2 10*3/uL (ref 0.0–0.5)
Eosinophils Relative: 4 %
Immature Granulocytes: 1 %
Lymphocytes Relative: 27 %
Lymphs Abs: 1.6 10*3/uL (ref 0.7–4.0)
Monocytes Absolute: 0.6 10*3/uL (ref 0.1–1.0)
Monocytes Relative: 10 %
Neutro Abs: 3.4 10*3/uL (ref 1.7–7.7)
Neutrophils Relative %: 58 %

## 2023-01-15 LAB — CBC WITH DIFFERENTIAL/PLATELET
Abs Immature Granulocytes: 0.03 10*3/uL (ref 0.00–0.07)
Basophils Absolute: 0 10*3/uL (ref 0.0–0.1)
Basophils Relative: 0 %
Eosinophils Absolute: 0.1 10*3/uL (ref 0.0–0.5)
Eosinophils Relative: 2 %
HCT: 25 % — ABNORMAL LOW (ref 36.0–46.0)
Hemoglobin: 8.1 g/dL — ABNORMAL LOW (ref 12.0–15.0)
Immature Granulocytes: 0 %
Lymphocytes Relative: 12 %
Lymphs Abs: 0.8 10*3/uL (ref 0.7–4.0)
MCH: 27.4 pg (ref 26.0–34.0)
MCHC: 32.4 g/dL (ref 30.0–36.0)
MCV: 84.5 fL (ref 80.0–100.0)
Monocytes Absolute: 0.5 10*3/uL (ref 0.1–1.0)
Monocytes Relative: 7 %
Neutro Abs: 5.4 10*3/uL (ref 1.7–7.7)
Neutrophils Relative %: 79 %
Platelets: 57 10*3/uL — ABNORMAL LOW (ref 150–400)
RBC: 2.96 MIL/uL — ABNORMAL LOW (ref 3.87–5.11)
RDW: 16.8 % — ABNORMAL HIGH (ref 11.5–15.5)
WBC: 6.9 10*3/uL (ref 4.0–10.5)
nRBC: 0 % (ref 0.0–0.2)

## 2023-01-15 LAB — COMPREHENSIVE METABOLIC PANEL
ALT: 26 U/L (ref 0–44)
AST: 37 U/L (ref 15–41)
Albumin: 2.7 g/dL — ABNORMAL LOW (ref 3.5–5.0)
Alkaline Phosphatase: 81 U/L (ref 38–126)
Anion gap: 11 (ref 5–15)
BUN: 17 mg/dL (ref 8–23)
CO2: 21 mmol/L — ABNORMAL LOW (ref 22–32)
Calcium: 8 mg/dL — ABNORMAL LOW (ref 8.9–10.3)
Chloride: 106 mmol/L (ref 98–111)
Creatinine, Ser: 0.85 mg/dL (ref 0.44–1.00)
GFR, Estimated: >60 mL/min (ref >60)
Glucose, Bld: 175 mg/dL — ABNORMAL HIGH (ref 70–99)
Potassium: 3.8 mmol/L (ref 3.5–5.1)
Sodium: 138 mmol/L (ref 135–145)
Total Bilirubin: 0.4 mg/dL (ref 0.3–1.2)
Total Protein: 6 g/dL — ABNORMAL LOW (ref 6.5–8.1)

## 2023-01-15 LAB — CBC
HCT: 26.4 % — ABNORMAL LOW (ref 36.0–46.0)
Hemoglobin: 8 g/dL — ABNORMAL LOW (ref 12.0–15.0)
MCH: 27.8 pg (ref 26.0–34.0)
MCHC: 30.3 g/dL (ref 30.0–36.0)
MCV: 91.7 fL (ref 80.0–100.0)
Platelets: 32 10*3/uL — ABNORMAL LOW (ref 150–400)
RBC: 2.88 MIL/uL — ABNORMAL LOW (ref 3.87–5.11)
RDW: 17.6 % — ABNORMAL HIGH (ref 11.5–15.5)
WBC: 5.5 10*3/uL (ref 4.0–10.5)
nRBC: 0 % (ref 0.0–0.2)

## 2023-01-15 LAB — GLUCOSE, CAPILLARY
Glucose-Capillary: 156 mg/dL — ABNORMAL HIGH (ref 70–99)
Glucose-Capillary: 236 mg/dL — ABNORMAL HIGH (ref 70–99)
Glucose-Capillary: 114 mg/dL — ABNORMAL HIGH (ref 70–99)
Glucose-Capillary: 192 mg/dL — ABNORMAL HIGH (ref 70–99)

## 2023-01-15 LAB — STREP PNEUMONIAE URINARY ANTIGEN: Strep Pneumo Urinary Antigen: NEGATIVE

## 2023-01-15 MED ORDER — HYDROCODONE-ACETAMINOPHEN 5-325 MG PO TABS
2.0000 | ORAL_TABLET | Freq: Four times a day (QID) | ORAL | Status: DC | PRN
  Administered 2023-01-15 – 2023-01-19 (×9): 2 via ORAL
  Filled 2023-01-15 (×11): qty 2

## 2023-01-15 MED ORDER — IPRATROPIUM-ALBUTEROL 0.5-2.5 (3) MG/3ML IN SOLN: 3.0000 mL | RESPIRATORY_TRACT | Status: DC | PRN

## 2023-01-15 MED ORDER — POLYETHYLENE GLYCOL 3350 17 G PO PACK
17.0000 g | PACK | Freq: Every day | ORAL | Status: DC
  Administered 2023-01-15: 17 g via ORAL
  Filled 2023-01-15: qty 1

## 2023-01-15 MED ORDER — BISACODYL 5 MG PO TBEC
5.0000 mg | DELAYED_RELEASE_TABLET | Freq: Every day | ORAL | Status: DC | PRN
  Administered 2023-01-15: 5 mg via ORAL
  Filled 2023-01-15: qty 1

## 2023-01-15 MED ORDER — MORPHINE SULFATE (PF) 2 MG/ML IV SOLN
2.0000 mg | INTRAVENOUS | Status: DC | PRN
  Administered 2023-01-17 – 2023-01-20 (×9): 2 mg via INTRAVENOUS
  Filled 2023-01-15 (×10): qty 1

## 2023-01-15 MED ORDER — HYDROCODONE BIT-HOMATROP MBR 5-1.5 MG/5ML PO SOLN
5.0000 mL | Freq: Four times a day (QID) | ORAL | Status: AC | PRN
  Administered 2023-01-16 – 2023-01-17 (×3): 5 mL via ORAL
  Filled 2023-01-15 (×4): qty 5

## 2023-01-15 NOTE — Progress Notes (Signed)
  Progress Note   Patient: Victoria [name] [id number] DOB: Mar 01, 1941 DOA: 01/13/2023     1 DOS: the patient was seen and examined on 01/15/2023   Brief hospital course: Victoria [name] is a 82 year old female with medical history significant for type 2 diabetes, hepatitis C, history of right breast cancer status postlumpectomy, and recently diagnosed pancreatic mass status post EGD/EUS on 01/09/2023 who presented on 1/23 with worsening shortness of breath, cough and nosebleeding.  In ED evaluation patient was afebrile with tachycardia and CT abdomen/CTA of chest showed scattered groundglass densities throughout the lungs either representing atypical infection or metastatic disease for which patient was started on IV ceftriaxone and azithromycin due to concern for possible atypical pneumonia.  Assessment and Plan:  Cough and SOB concerning for either atypical pneumonia or possible metastatic disease.  No current O2 requirements, still reports occasional cough worse at night. Remains afebrile, COVID test negative, Resp panel negative - continue IV ceftriaxone and azithromycin -monitor o2 -Follow sputum culture -Add Legionella, Strep pneumonia -Incentive spirometry, hycodan syrup prn  Reported epistaxis, resolved -If remains stable with stable hemoglobin on 01/17/24 may be able to resume home eliquis   Abdominal pain secondary to pancreatic adenocarcinoma with retroperitoneal adenopathy and diffuse lung nodules concerning for metastatic disease Victoria [name] discussed with the patient by needle biopsy results and endoscopy findings during hospital stay -Will f/u with Victoria [name] as outpatient after discharge -increase pain control to 10 mg hydrocodone/acetaminophen q6 hrs moderate pain and 2 mg IV morphine for breakthrough PRN 4h  Acute on chronic anemia secondary to blood loss Presented with epistaxis in setting of anticoagulation. No repeat epistaxis in hospital stay so far. Denies any bleeding  otherwise. Baseline hemoglobin fluctuates but back in 12/23 was 8.2-8.8 after starting eliquis for her acute PE ( prior to that was 10.6-11.2 in 10/2022) -serial CBC -hold home eliquis for now -SCDs prophylaxis  Thrombocytopenia in the setting of chronic liver disease, liver cirrhosis secondary to hepatitis C - Monitor CBC - Immature platelet fraction pending  History of PE, 11/2022, likely from malignancy - holiding home eliquis due to bleeding -will need to discuss with oncology before discharge, will likely need trial on eliquis in hospital to monitor for toleration - add SCDs      Subjective: denies nausaea, vomtiing. Reports most pain in left upper abdomen and rib cage. Did well with 2o of hydro for pain. Feels 1 mg morphine not very helfpul. Had mild cough with some phlegm overnight  Physical Exam: Vitals:   01/14/23 1533 01/14/23 1652 01/14/23 2124 01/15/23 0609  BP: 134/85 131/77 (!) 152/82 123/85  Pulse: 84 90 89 83  Resp: '16  18 18  '$ Temp:  98.4 F (36.9 C) 99 F (37.2 C) 98.3 F (36.8 C)  TempSrc:  Oral Oral Oral  SpO2: 100% 98% 94% 96%  Height:       Resting comfortably in bed, no distress RRR, no appreciated murmurs or edema Minimal crackles at bases, normal respiratory effort Abdomen soft, mild tenderness in left upper quadrant, no rebound tenderness, normal bowel sounds Data Reviewed:  There are no new results to review at this time.  Family Communication: none  Disposition: Status is: Inpatient Remains inpatient appropriate because: Close monitoring of hemoglobin, reinitiation of Eliquis once stable, IV antibiotics for potential atypical infection, continue pain control for malignancy related pain  Planned Discharge Destination: Home     Author: Desiree Hane, MD 01/15/2023 10:57 AM  For on call review [email or web address].

## 2023-01-15 NOTE — Progress Notes (Signed)
Mobility Specialist - Progress Note   01/15/23 1012  Mobility  Activity Ambulated with assistance in hallway;Ambulated with assistance to bathroom  Level of Assistance Modified independent, requires aide device or extra time  Assistive Device Other (Comment) (IV Pole)  Distance Ambulated (ft) 500 ft  Activity Response Tolerated well  Mobility Referral Yes  $Mobility charge 1 Mobility   Pt received in bed and agreeable to mobility. No complaints during session. Assisted pt to bathroom at EOS. Pt to bed after session with all needs met.    [facility]

## 2023-01-15 NOTE — Progress Notes (Signed)
Ordered SCD's for patient as per MD, portable is aware and as of now they are trying to provide one. We will continue to monitor.

## 2023-01-16 DIAGNOSIS — R0902 Hypoxemia: Secondary | ICD-10-CM | POA: Diagnosis not present

## 2023-01-16 LAB — COMPREHENSIVE METABOLIC PANEL
ALT: 32 U/L (ref 0–44)
AST: 46 U/L — ABNORMAL HIGH (ref 15–41)
Albumin: 2.9 g/dL — ABNORMAL LOW (ref 3.5–5.0)
Alkaline Phosphatase: 88 U/L (ref 38–126)
Anion gap: 10 (ref 5–15)
BUN: 14 mg/dL (ref 8–23)
CO2: 26 mmol/L (ref 22–32)
Calcium: 8.3 mg/dL — ABNORMAL LOW (ref 8.9–10.3)
Chloride: 101 mmol/L (ref 98–111)
Creatinine, Ser: 0.85 mg/dL (ref 0.44–1.00)
GFR, Estimated: >60 mL/min (ref >60)
Glucose, Bld: 136 mg/dL — ABNORMAL HIGH (ref 70–99)
Potassium: 4.1 mmol/L (ref 3.5–5.1)
Sodium: 137 mmol/L (ref 135–145)
Total Bilirubin: 0.7 mg/dL (ref 0.3–1.2)
Total Protein: 6.2 g/dL — ABNORMAL LOW (ref 6.5–8.1)

## 2023-01-16 LAB — CBC WITH DIFFERENTIAL/PLATELET
Abs Immature Granulocytes: 0.03 10*3/uL (ref 0.00–0.07)
Basophils Absolute: 0 10*3/uL (ref 0.0–0.1)
Basophils Relative: 1 %
Eosinophils Absolute: 0.3 10*3/uL (ref 0.0–0.5)
Eosinophils Relative: 4 %
HCT: 25.9 % — ABNORMAL LOW (ref 36.0–46.0)
Hemoglobin: 8.5 g/dL — ABNORMAL LOW (ref 12.0–15.0)
Immature Granulocytes: 0 %
Lymphocytes Relative: 27 %
Lymphs Abs: 1.9 10*3/uL (ref 0.7–4.0)
MCH: 27.8 pg (ref 26.0–34.0)
MCHC: 32.8 g/dL (ref 30.0–36.0)
MCV: 84.6 fL (ref 80.0–100.0)
Monocytes Absolute: 0.7 10*3/uL (ref 0.1–1.0)
Monocytes Relative: 10 %
Neutro Abs: 4.1 10*3/uL (ref 1.7–7.7)
Neutrophils Relative %: 58 %
Platelets: 66 10*3/uL — ABNORMAL LOW (ref 150–400)
RBC: 3.06 MIL/uL — ABNORMAL LOW (ref 3.87–5.11)
RDW: 17 % — ABNORMAL HIGH (ref 11.5–15.5)
WBC: 7.1 10*3/uL (ref 4.0–10.5)
nRBC: 0 % (ref 0.0–0.2)

## 2023-01-16 LAB — GLUCOSE, CAPILLARY
Glucose-Capillary: 171 mg/dL — ABNORMAL HIGH (ref 70–99)
Glucose-Capillary: 234 mg/dL — ABNORMAL HIGH (ref 70–99)
Glucose-Capillary: 138 mg/dL — ABNORMAL HIGH (ref 70–99)
Glucose-Capillary: 155 mg/dL — ABNORMAL HIGH (ref 70–99)

## 2023-01-16 LAB — SURGICAL PATHOLOGY

## 2023-01-16 MED ORDER — POLYETHYLENE GLYCOL 3350 17 G PO PACK: 17.0000 g | PACK | Freq: Two times a day (BID) | ORAL | Status: DC | PRN

## 2023-01-16 MED ORDER — MAGNESIUM CITRATE PO SOLN
1.0000 | Freq: Once | ORAL | Status: AC
  Administered 2023-01-16: 1 via ORAL
  Filled 2023-01-16: qty 296

## 2023-01-16 MED ORDER — DOCUSATE SODIUM 100 MG PO CAPS
100.0000 mg | ORAL_CAPSULE | Freq: Two times a day (BID) | ORAL | Status: DC
  Administered 2023-01-16 – 2023-01-20 (×8): 100 mg via ORAL
  Filled 2023-01-16 (×8): qty 1

## 2023-01-16 MED ORDER — BISACODYL 5 MG PO TBEC
10.0000 mg | DELAYED_RELEASE_TABLET | Freq: Every day | ORAL | Status: DC | PRN
  Administered 2023-01-16: 10 mg via ORAL
  Filled 2023-01-16: qty 2

## 2023-01-16 MED ORDER — APIXABAN 5 MG PO TABS
5.0000 mg | ORAL_TABLET | Freq: Two times a day (BID) | ORAL | Status: DC
  Administered 2023-01-16 – 2023-01-20 (×9): 5 mg via ORAL
  Filled 2023-01-16 (×9): qty 1

## 2023-01-16 NOTE — Progress Notes (Signed)
Progress Note   Patient: Victoria [name] [id number] DOB: [birth year] DOA: 01/13/2023     2 DOS: the patient was seen and examined on 01/16/2023   Brief hospital course: Ms. Decelles is a 82 year old female with medical history significant for type 2 diabetes, hepatitis C, history of right breast cancer status postlumpectomy, and recently diagnosed pancreatic mass status post EGD/EUS on 01/09/2023 who presented on 1/23 with worsening shortness of breath, cough and nosebleeding.  In ED evaluation patient was afebrile with tachycardia and CT abdomen/CTA of chest showed scattered groundglass densities throughout the lungs either representing atypical infection or metastatic disease for which patient was started on IV ceftriaxone and azithromycin due to concern for possible atypical pneumonia.  Assessment and Plan: Cough and SOB concerning for either atypical pneumonia or possible metastatic disease.  No current O2 requirements, still reports occasional cough worse at night. Remains afebrile, COVID test negative, Resp panel negative - continue IV ceftriaxone and azithromycin -monitor o2 -Follow sputum culture -Pending Legionella, Strep pneumonia neg -Incentive spirometry, hycodan syrup prn  Reported epistaxis, resolved -resume Eliquis today  Abdominal pain secondary to pancreatic adenocarcinoma with retroperitoneal adenopathy and diffuse lung nodules concerning for metastatic disease Dr. Morey Hummingbird discussed with the patient by needle biopsy results and endoscopy findings during hospital stay -Will f/u with Dr. Burr Medico as outpatient after discharge -increase pain control to 10 mg hydrocodone/acetaminophen q6 hrs moderate pain and 2 mg IV morphine for breakthrough PRN 4h  Acute on chronic anemia secondary to blood loss Presented with epistaxis in setting of anticoagulation. No repeat epistaxis in hospital stay so far. Denies any bleeding otherwise. Baseline hemoglobin fluctuates but back in 12/23 was  8.2-8.8 after starting eliquis for her acute PE ( prior to that was 10.6-11.2 in 10/2022) -serial CBC is stable  Thrombocytopenia in the setting of chronic liver disease, liver cirrhosis secondary to hepatitis C - Monitor CBC - Immature platelet fraction pending  History of PE, 11/2022, likely from malignancy - was holiding home eliquis due to bleeding, resume today - per Dr. Burr Medico ok to continue anticoagulation - add SCDs     Subjective:  Doing well, her main concern today is that she hasn't had a BM in 2-4 days. No more bleeding, pain well controlled.  Physical Exam: Vitals:   01/14/23 2124 01/15/23 0609 01/15/23 2151 01/16/23 0611  BP: (!) 152/82 123/85 (!) 150/94 121/71  Pulse: 89 83 79 84  Resp: '18 18 18 18  '$ Temp: 99 F (37.2 C) 98.3 F (36.8 C) 98.9 F (37.2 C) 98.5 F (36.9 C)  TempSrc: Oral Oral Oral Oral  SpO2: 94% 96% 97% 96%  Height:      General:  Alert, oriented, calm, in no acute distress  Eyes: EOMI, clear conjuctivae, white sclerea Neck: supple, no masses, trachea mildline  Cardiovascular: RRR, no murmurs or rubs, no peripheral edema  Respiratory: clear to auscultation bilaterally, no wheezes, no crackles  Abdomen: soft, nontender, nondistended, normal bowel tones heard  Skin: dry, no rashes  Musculoskeletal: no joint effusions, normal range of motion  Psychiatric: appropriate affect, normal speech  Neurologic: extraocular muscles intact, clear speech, moving all extremities with intact sensorium  Data Reviewed:  There are no new results to review at this time.  Family Communication: none  Disposition: Status is: Inpatient Remains inpatient appropriate because: Close monitoring of hemoglobin, reinitiation of Eliquis today, IV antibiotics for potential atypical infection, continue pain control for malignancy related pain. Likely home in 24 hours if stable.   Planned Discharge  Destination: Home   Author: Levora Werden Marry Guan, MD 01/16/2023 1:02  PM  For on call review [email or web address].

## 2023-01-16 NOTE — Progress Notes (Signed)
  Transition of Care [facility]) Screening Note   Patient Details  Name: Victoria [name] Date of Birth: 08/17/1941   Transition of Care [facility]) CM/SW Contact:    Lennart Pall, LCSW Phone Number: 01/16/2023, 2:54 PM    Transition of Care Department [facility]) has reviewed patient and no TOC needs have been identified at this time. We will continue to monitor patient advancement through interdisciplinary progression rounds. If new patient transition needs arise, please place a TOC consult.

## 2023-01-17 ENCOUNTER — Encounter: Payer: Self-pay | Admitting: Gastroenterology

## 2023-01-17 DIAGNOSIS — R0902 Hypoxemia: Secondary | ICD-10-CM | POA: Diagnosis not present

## 2023-01-17 LAB — GLUCOSE, CAPILLARY
Glucose-Capillary: 154 mg/dL — ABNORMAL HIGH (ref 70–99)
Glucose-Capillary: 172 mg/dL — ABNORMAL HIGH (ref 70–99)
Glucose-Capillary: 115 mg/dL — ABNORMAL HIGH (ref 70–99)
Glucose-Capillary: 161 mg/dL — ABNORMAL HIGH (ref 70–99)

## 2023-01-17 LAB — CBC WITH DIFFERENTIAL/PLATELET
Abs Immature Granulocytes: 0.02 10*3/uL (ref 0.00–0.07)
Basophils Absolute: 0 10*3/uL (ref 0.0–0.1)
Basophils Relative: 0 %
Eosinophils Absolute: 0.2 10*3/uL (ref 0.0–0.5)
Eosinophils Relative: 2 %
HCT: 24.5 % — ABNORMAL LOW (ref 36.0–46.0)
Hemoglobin: 7.9 g/dL — ABNORMAL LOW (ref 12.0–15.0)
Immature Granulocytes: 0 %
Lymphocytes Relative: 26 %
Lymphs Abs: 1.7 10*3/uL (ref 0.7–4.0)
MCH: 27.8 pg (ref 26.0–34.0)
MCHC: 32.2 g/dL (ref 30.0–36.0)
MCV: 86.3 fL (ref 80.0–100.0)
Monocytes Absolute: 0.7 10*3/uL (ref 0.1–1.0)
Monocytes Relative: 10 %
Neutro Abs: 4.1 10*3/uL (ref 1.7–7.7)
Neutrophils Relative %: 62 %
Platelets: 79 10*3/uL — ABNORMAL LOW (ref 150–400)
RBC: 2.84 MIL/uL — ABNORMAL LOW (ref 3.87–5.11)
RDW: 17.5 % — ABNORMAL HIGH (ref 11.5–15.5)
WBC: 6.7 10*3/uL (ref 4.0–10.5)
nRBC: 0 % (ref 0.0–0.2)

## 2023-01-17 LAB — LEGIONELLA PNEUMOPHILA SEROGP 1 UR AG: L. pneumophila Serogp 1 Ur Ag: NEGATIVE

## 2023-01-17 MED ORDER — SODIUM CHLORIDE 0.9% FLUSH: 10.0000 mL | INTRAVENOUS | Status: DC | PRN

## 2023-01-17 NOTE — Progress Notes (Signed)
Progress Note   Patient: Victoria [name] [id number]:[id number] DOB: [birth year] DOA: 01/13/2023     3 DOS: the patient was seen and examined on 01/17/2023   Brief hospital course: Victoria [name] is a 82 year old female with medical history significant for type 2 diabetes, hepatitis C, history of right breast cancer status postlumpectomy, and recently diagnosed pancreatic mass status post EGD/EUS on 01/09/2023 who presented on 1/23 with worsening shortness of breath, cough and nosebleeding.  In ED evaluation patient was afebrile with tachycardia and CT abdomen/CTA of chest showed scattered groundglass densities throughout the lungs either representing atypical infection or metastatic disease for which patient was started on IV ceftriaxone and azithromycin due to concern for possible atypical pneumonia.  Assessment and Plan: Cough and SOB concerning for either atypical pneumonia or possible metastatic disease.  No current O2 requirements, still reports occasional cough worse at night. Remains afebrile, COVID test negative, Resp panel negative - continue IV ceftriaxone and azithromycin -monitor o2 -Follow sputum culture -Pending Legionella, Strep pneumonia neg -Incentive spirometry, hycodan syrup prn  Reported epistaxis, resolved -resumed Eliquis 1/25 will continue, with a close eye on Hb down a little to 7.9 today  Abdominal pain secondary to pancreatic adenocarcinoma with retroperitoneal adenopathy and diffuse lung nodules concerning for metastatic disease Dr. Morey Hummingbird discussed with the patient by needle biopsy results and endoscopy findings during hospital stay -Will f/u with Dr. Burr Medico as outpatient after discharge -increase pain control to 10 mg hydrocodone/acetaminophen q6 hrs moderate pain and 2 mg IV morphine for breakthrough PRN 4h  Acute on chronic anemia secondary to blood loss Presented with epistaxis in setting of anticoagulation. No repeat epistaxis in hospital stay so far. Denies any bleeding  otherwise. Baseline hemoglobin fluctuates but back in 12/23 was 8.2-8.8 after starting eliquis for her acute PE ( prior to that was 10.6-11.2 in 10/2022) -serial CBC is stable  Thrombocytopenia in the setting of chronic liver disease, liver cirrhosis secondary to hepatitis C - Monitor CBC - Immature platelet fraction pending  History of PE, 11/2022, likely from malignancy - was holiding home eliquis due to bleeding, resume today - per Dr. Burr Medico ok to continue anticoagulation - add SCDs     Subjective:  She is very anxious today, about some bruising on her legs and arms that was not there until she restarted Eliquis yesterday. Also concerned that she did not get her IV abx as her IV infiltrated overnight. She has a new IV now.  Physical Exam: Vitals:   01/14/23 2124 01/15/23 0609 01/15/23 2151 01/16/23 0611  BP: (!) 152/82 123/85 (!) 150/94 121/71  Pulse:  83 79 84  Resp:  '18 18 18  '$ Temp:    98.5 F (36.9 C)  TempSrc: Oral Oral Oral Oral  SpO2:  96% 97% 96%  Height:      General:  Alert, oriented, calm, in no acute distress  Eyes: EOMI, clear conjuctivae, white sclerea Neck: supple, no masses, trachea mildline  Cardiovascular: RRR, no murmurs or rubs, no peripheral edema  Respiratory: clear to auscultation bilaterally, no wheezes, no crackles  Abdomen: soft, nontender, nondistended, normal bowel tones heard  Skin: dry, no rashes; nontender ecchymoses on right leg Musculoskeletal: no joint effusions, normal range of motion  Psychiatric: appropriate affect, normal speech  Neurologic: extraocular muscles intact, clear speech, moving all extremities with intact sensorium  Data Reviewed:  There are no new results to review at this time.  Family Communication: none  Disposition: Status is: Inpatient Remains inpatient appropriate because: Close monitoring  of hemoglobin, reinitiation of Eliquis today, IV antibiotics for potential atypical infection, continue pain control for  malignancy related pain. Likely home in 24 hours if stable.   Planned Discharge Destination: Home   Author: Josalynn Johndrow Marry Guan, MD 01/17/2023 11:34 AM  For on call review [email or web address].

## 2023-01-18 DIAGNOSIS — R0902 Hypoxemia: Secondary | ICD-10-CM | POA: Diagnosis not present

## 2023-01-18 LAB — GLUCOSE, CAPILLARY
Glucose-Capillary: 144 mg/dL — ABNORMAL HIGH (ref 70–99)
Glucose-Capillary: 162 mg/dL — ABNORMAL HIGH (ref 70–99)
Glucose-Capillary: 147 mg/dL — ABNORMAL HIGH (ref 70–99)
Glucose-Capillary: 152 mg/dL — ABNORMAL HIGH (ref 70–99)

## 2023-01-18 LAB — CBC WITH DIFFERENTIAL/PLATELET
Abs Immature Granulocytes: 0.03 10*3/uL (ref 0.00–0.07)
Basophils Absolute: 0 10*3/uL (ref 0.0–0.1)
Basophils Relative: 0 %
Eosinophils Absolute: 0.1 10*3/uL (ref 0.0–0.5)
Eosinophils Relative: 2 %
HCT: 23.5 % — ABNORMAL LOW (ref 36.0–46.0)
Hemoglobin: 7.4 g/dL — ABNORMAL LOW (ref 12.0–15.0)
Immature Granulocytes: 0 %
Lymphocytes Relative: 23 %
Lymphs Abs: 1.6 10*3/uL (ref 0.7–4.0)
MCH: 27.3 pg (ref 26.0–34.0)
MCHC: 31.5 g/dL (ref 30.0–36.0)
MCV: 86.7 fL (ref 80.0–100.0)
Monocytes Absolute: 0.7 10*3/uL (ref 0.1–1.0)
Monocytes Relative: 10 %
Neutro Abs: 4.6 10*3/uL (ref 1.7–7.7)
Neutrophils Relative %: 65 %
Platelets: 105 10*3/uL — ABNORMAL LOW (ref 150–400)
RBC: 2.71 MIL/uL — ABNORMAL LOW (ref 3.87–5.11)
RDW: 17.6 % — ABNORMAL HIGH (ref 11.5–15.5)
WBC: 7.1 10*3/uL (ref 4.0–10.5)
nRBC: 0 % (ref 0.0–0.2)

## 2023-01-18 MED ORDER — HYDROCOD POLI-CHLORPHE POLI ER 10-8 MG/5ML PO SUER
5.0000 mL | Freq: Two times a day (BID) | ORAL | Status: DC | PRN
  Administered 2023-01-19 (×2): 5 mL via ORAL
  Filled 2023-01-18 (×3): qty 5

## 2023-01-18 MED ORDER — HYDROCODONE BIT-HOMATROP MBR 5-1.5 MG/5ML PO SOLN: 5.0000 mL | Freq: Four times a day (QID) | ORAL | Status: DC | PRN

## 2023-01-18 NOTE — Progress Notes (Signed)
Progress Note   Patient: Victoria [name] [id number] DOB: August 07, 1941 DOA: 01/13/2023     4 DOS: the patient was seen and examined on 01/18/2023   Brief hospital course: Ms. Dedic is a 82 year old female with medical history significant for type 2 diabetes, hepatitis C, history of right breast cancer status postlumpectomy, and recently diagnosed pancreatic mass status post EGD/EUS on 01/09/2023 who presented on 1/23 with worsening shortness of breath, cough and nosebleeding.  In ED evaluation patient was afebrile with tachycardia and CT abdomen/CTA of chest showed scattered groundglass densities throughout the lungs either representing atypical infection or metastatic disease for which patient was started on IV ceftriaxone and azithromycin due to concern for possible atypical pneumonia.  01/18/2023: Patient seen.  No new complaints.  Hematoma/swelling of left upper extremity noted.  Hemoglobin has dropped from 7.9 g/dL to 7.4 g/dL.  Will continue to monitor H/H.  Patient is not keen on being discharged.  Assessment and Plan: Cough and SOB concerning for either atypical pneumonia or possible metastatic disease.  No current O2 requirements, still reports occasional cough worse at night. Remains afebrile, COVID test negative, Resp panel negative - continue IV ceftriaxone and azithromycin -monitor o2 -Follow sputum culture -Pending Legionella, Strep pneumonia neg -Incentive spirometry, hycodan syrup prn  Reported epistaxis, resolved -resumed Eliquis 1/25 will continue, with a close eye on Hb down a little to 7.9 today 01/18/2023: No further epistaxis.  Continue to monitor H&H.  Abdominal pain secondary to pancreatic adenocarcinoma with retroperitoneal adenopathy and diffuse lung nodules concerning for metastatic disease Dr. Morey Hummingbird discussed with the patient by needle biopsy results and endoscopy findings during hospital stay -Will f/u with Dr. Burr Medico as outpatient after discharge -increase pain  control to 10 mg hydrocodone/acetaminophen q6 hrs moderate pain and 2 mg IV morphine for breakthrough PRN 4h 01/18/2023: No abdominal pain reported.  Acute on chronic anemia secondary to blood loss Presented with epistaxis in setting of anticoagulation. No repeat epistaxis in hospital stay so far. Denies any bleeding otherwise. Baseline hemoglobin fluctuates but back in 12/23 was 8.2-8.8 after starting eliquis for her acute PE ( prior to that was 10.6-11.2 in 10/2022) -serial CBC is stable 01/18/2023: Repeat CBC in the morning.  Thrombocytopenia in the setting of chronic liver disease, liver cirrhosis secondary to hepatitis C - Monitor CBC - Immature platelet fraction pending  History of PE, 11/2022, likely from malignancy - was holiding home eliquis due to bleeding, resume today - per Dr. Burr Medico ok to continue anticoagulation - add SCDs 01/18/2023: Patient is on Eliquis 5 Mg p.o. twice daily.  Continue to monitor closely for bleeding.     Subjective:  -Continues to report bruises. -No epistaxis.    Physical Exam: Vitals:   01/16/23 0611 01/17/23 2130 01/18/23 0510 01/18/23 1339  BP: 121/71 (!) 157/82 (!) 115/99 (!) 163/62  Pulse:  90 87 79  Resp:  '16 16 16  '$ Temp:  98.8 F (37.1 C) 98.4 F (36.9 C) 97.9 F (36.6 C)  TempSrc: Oral Oral Oral Oral  SpO2:  92% 93% 97%  Height:      General:  Alert, oriented, calm, in no acute distress  Eyes: EOMI, clear conjuctivae, white sclerea Neck: supple, no masses, trachea mildline  Cardiovascular: RRR, no murmurs or rubs, no peripheral edema  Respiratory: clear to auscultation bilaterally, no wheezes, no crackles  Abdomen: soft, nontender, nondistended, normal bowel tones heard  Skin: dry, no rashes; nontender ecchymoses on right leg Musculoskeletal: no joint effusions, normal range of motion  Psychiatric: appropriate affect, normal speech  Neurologic: extraocular muscles intact, clear speech, moving all extremities with intact  sensorium  Data Reviewed:  There are no new results to review at this time.  Family Communication: none  Disposition: Status is: Inpatient Remains inpatient appropriate because: Close monitoring of hemoglobin, reinitiation of Eliquis today, IV antibiotics for potential atypical infection, continue pain control for malignancy related pain. Likely home in 24 hours if stable.   Planned Discharge Destination: Home   Author: Bonnell Public, MD 01/18/2023 4:03 PM  For on call review [email or web address].

## 2023-01-19 DIAGNOSIS — R0902 Hypoxemia: Secondary | ICD-10-CM | POA: Diagnosis not present

## 2023-01-19 LAB — CBC WITH DIFFERENTIAL/PLATELET
Abs Immature Granulocytes: 0.03 10*3/uL (ref 0.00–0.07)
Basophils Absolute: 0 10*3/uL (ref 0.0–0.1)
Basophils Relative: 0 %
Eosinophils Absolute: 0.2 10*3/uL (ref 0.0–0.5)
Eosinophils Relative: 2 %
HCT: 23.3 % — ABNORMAL LOW (ref 36.0–46.0)
Hemoglobin: 7.4 g/dL — ABNORMAL LOW (ref 12.0–15.0)
Immature Granulocytes: 0 %
Lymphocytes Relative: 23 %
Lymphs Abs: 1.6 10*3/uL (ref 0.7–4.0)
MCH: 27.9 pg (ref 26.0–34.0)
MCHC: 31.8 g/dL (ref 30.0–36.0)
MCV: 87.9 fL (ref 80.0–100.0)
Monocytes Absolute: 0.7 10*3/uL (ref 0.1–1.0)
Monocytes Relative: 10 %
Neutro Abs: 4.4 10*3/uL (ref 1.7–7.7)
Neutrophils Relative %: 65 %
Platelets: 115 10*3/uL — ABNORMAL LOW (ref 150–400)
RBC: 2.65 MIL/uL — ABNORMAL LOW (ref 3.87–5.11)
RDW: 18 % — ABNORMAL HIGH (ref 11.5–15.5)
WBC: 6.8 10*3/uL (ref 4.0–10.5)
nRBC: 0 % (ref 0.0–0.2)

## 2023-01-19 LAB — GLUCOSE, CAPILLARY
Glucose-Capillary: 152 mg/dL — ABNORMAL HIGH (ref 70–99)
Glucose-Capillary: 135 mg/dL — ABNORMAL HIGH (ref 70–99)
Glucose-Capillary: 125 mg/dL — ABNORMAL HIGH (ref 70–99)

## 2023-01-19 NOTE — Progress Notes (Signed)
Progress Note   Patient: Victoria [name] XVQ:[id number] DOB: [birth year] DOA: 01/13/2023     5 DOS: the patient was seen and examined on 01/19/2023   Brief hospital course: Victoria [name] is a 82 year old female with medical history significant for type 2 diabetes, hepatitis C, history of right breast cancer status postlumpectomy, and recently diagnosed pancreatic mass status post EGD/EUS on 01/09/2023 who presented on 1/23 with worsening shortness of breath, cough and nosebleeding.  In ED evaluation patient was afebrile with tachycardia and CT abdomen/CTA of chest showed scattered groundglass densities throughout the lungs either representing atypical infection or metastatic disease for which patient was started on IV ceftriaxone and azithromycin due to concern for possible atypical pneumonia.  01/18/2023: Patient seen.  No new complaints.  Hematoma/swelling of left upper extremity noted.  Hemoglobin has dropped from 7.9 g/dL to 7.4 g/dL.  Will continue to monitor H/H.  Patient is not keen on being discharged.  01/19/2023: Hemoglobin is stable at 7.4 g/dL.  No further epistaxis.  Patient is not cannot be discharged home today.  Patient prefers to be discharged to home tomorrow (likely DC in a.m.).  Assessment and Plan: -Cough and SOB concerning for either atypical pneumonia or possible metastatic disease.  -No current O2 requirements, still reports occasional cough worse at night. -Remains afebrile, COVID test negative, Resp panel negative -Patient has completed course of IV ceftriaxone and azithromycin -Continue to monitor oxygen level.      -Follow sputum culture -Pending Legionella, Strep pneumonia neg -Incentive spirometry, hycodan syrup prn  Reported epistaxis: -Resolved. -resumed Eliquis on 01/16/2023. -Hemoglobin has been stable at 7.4 g/dL.   -Continue to monitor H&H.  Abdominal pain: -Secondary to pancreatic adenocarcinoma with retroperitoneal adenopathy and diffuse lung nodules  concerning for metastatic disease -Dr. Morey Hummingbird discussed with the patient by needle biopsy results and endoscopy findings during hospital stay -Will f/u with Dr. Burr Medico as outpatient after discharge -increase pain control to 10 mg hydrocodone/acetaminophen q6 hrs moderate pain and 2 mg IV morphine for breakthrough PRN 4h 01/18/2023: No abdominal pain reported.  Acute on chronic anemia: -Secondary to blood loss -Presented with epistaxis in setting of anticoagulation. -No repeat epistaxis in hospital stay so far. Denies any bleeding otherwise. -Baseline hemoglobin fluctuates but back in 12/23 was 8.2-8.8 after starting eliquis for her acute PE ( prior to that was 10.6-11.2 in 10/2022) -serial CBC is stable 01/18/2023: Repeat CBC in the morning. 01/19/2023: Hemoglobin today 7.4 g/dL (stable).  Thrombocytopenia: -In the setting of chronic liver disease, liver cirrhosis secondary to hepatitis C - Monitor CBC - Immature platelet fraction pending  History of PE, 11/2022: -Likely secondary to malignancy.    - was holiding home eliquis due to bleeding, but now resumed.   - per Dr. Burr Medico ok to continue anticoagulation - add SCDs 01/18/2023: Patient is on Eliquis 5 Mg p.o. twice daily.  Continue to monitor closely for bleeding.     Subjective:  -New complaints. -No epistaxis.   -Not keen on being discharged back home today.  Physical Exam: Vitals:   01/18/23 0510 01/18/23 1339 01/18/23 2137 01/19/23 0445  BP: (!) 115/99 (!) 163/62 (!) 172/95 (!) 156/94  Pulse: 87 79 (!) 104 93  Resp: '16 16 16 16  '$ Temp: 98.4 F (36.9 C) 97.9 F (36.6 C) 98.7 F (37.1 C) 98.2 F (36.8 C)  TempSrc: Oral Oral Oral Oral  SpO2: 93% 97% 95% 95%  Height:      General:  Alert, oriented, calm, in no acute distress  Eyes: EOMI, clear conjuctivae, white sclerea Neck: supple, no masses, trachea mildline  Cardiovascular: RRR, no murmurs or rubs, no peripheral edema  Respiratory: clear to auscultation bilaterally, no  wheezes, no crackles  Abdomen: soft, nontender, nondistended, normal bowel tones heard  Skin: dry, no rashes; nontender ecchymoses on right leg Musculoskeletal: no joint effusions, normal range of motion  Psychiatric: appropriate affect, normal speech  Neurologic: extraocular muscles intact, clear speech, moving all extremities with intact sensorium  Data Reviewed:  There are no new results to review at this time.  Family Communication: none  Disposition: Status is: Inpatient Remains inpatient appropriate because: Close monitoring of hemoglobin, reinitiation of Eliquis today, IV antibiotics for potential atypical infection, continue pain control for malignancy related pain. Likely home in 24 hours if stable.   Planned Discharge Destination: Home   Author: Bonnell Public, MD 01/19/2023 4:00 PM  For on call review [email or web address].

## 2023-01-19 NOTE — Progress Notes (Signed)
Patient has refused vital signs and blood glucose check for dinner meal.  Dr. Marthenia Rolling has been notified and was at bedside.

## 2023-01-19 NOTE — Progress Notes (Signed)
Patient refused to allow NT to get afternoon vital signs.  This RN educated patient on the importance of routine vital signs and the policy of the hospital on the frequency.  Patient continues to refuse.  Will notify MD.

## 2023-01-20 ENCOUNTER — Inpatient Hospital Stay (HOSPITAL_COMMUNITY): Payer: Medicare HMO

## 2023-01-20 DIAGNOSIS — R042 Hemoptysis: Secondary | ICD-10-CM | POA: Diagnosis present

## 2023-01-20 DIAGNOSIS — D62 Acute posthemorrhagic anemia: Secondary | ICD-10-CM | POA: Diagnosis present

## 2023-01-20 DIAGNOSIS — R609 Edema, unspecified: Secondary | ICD-10-CM

## 2023-01-20 DIAGNOSIS — J189 Pneumonia, unspecified organism: Secondary | ICD-10-CM | POA: Diagnosis not present

## 2023-01-20 DIAGNOSIS — R040 Epistaxis: Secondary | ICD-10-CM | POA: Diagnosis present

## 2023-01-20 LAB — CBC WITH DIFFERENTIAL/PLATELET
Abs Immature Granulocytes: 0.03 10*3/uL (ref 0.00–0.07)
Basophils Absolute: 0 10*3/uL (ref 0.0–0.1)
Basophils Relative: 0 %
Eosinophils Absolute: 0.2 10*3/uL (ref 0.0–0.5)
Eosinophils Relative: 3 %
HCT: 23.9 % — ABNORMAL LOW (ref 36.0–46.0)
Hemoglobin: 7.3 g/dL — ABNORMAL LOW (ref 12.0–15.0)
Immature Granulocytes: 0 %
Lymphocytes Relative: 21 %
Lymphs Abs: 1.4 10*3/uL (ref 0.7–4.0)
MCH: 28.2 pg (ref 26.0–34.0)
MCHC: 30.5 g/dL (ref 30.0–36.0)
MCV: 92.3 fL (ref 80.0–100.0)
Monocytes Absolute: 0.7 10*3/uL (ref 0.1–1.0)
Monocytes Relative: 10 %
Neutro Abs: 4.4 10*3/uL (ref 1.7–7.7)
Neutrophils Relative %: 66 %
Platelets: 133 10*3/uL — ABNORMAL LOW (ref 150–400)
RBC: 2.59 MIL/uL — ABNORMAL LOW (ref 3.87–5.11)
RDW: 18.8 % — ABNORMAL HIGH (ref 11.5–15.5)
WBC: 6.7 10*3/uL (ref 4.0–10.5)
nRBC: 0.7 % — ABNORMAL HIGH (ref 0.0–0.2)

## 2023-01-20 LAB — GLUCOSE, CAPILLARY: Glucose-Capillary: 192 mg/dL — ABNORMAL HIGH (ref 70–99)

## 2023-01-20 MED ORDER — HYDROCOD POLI-CHLORPHE POLI ER 10-8 MG/5ML PO SUER: 5.0000 mL | Freq: Two times a day (BID) | ORAL | 0 refills | Status: DC | PRN

## 2023-01-20 MED ORDER — B COMPLEX-C PO TABS: 1.0000 | ORAL_TABLET | Freq: Every day | ORAL | 0 refills | Status: DC

## 2023-01-20 MED ORDER — GUAIFENESIN ER 600 MG PO TB12: 600.0000 mg | ORAL_TABLET | Freq: Two times a day (BID) | ORAL | 2 refills | Status: DC

## 2023-01-20 MED ORDER — FERROUS SULFATE 325 (65 FE) MG PO TABS: 325.0000 mg | ORAL_TABLET | Freq: Every day | ORAL | 3 refills | Status: DC

## 2023-01-20 MED ORDER — OXYMETAZOLINE HCL 0.05 % NA SOLN: 2.0000 | Freq: Two times a day (BID) | NASAL | 0 refills | Status: DC | PRN

## 2023-01-20 MED ORDER — BENZONATATE 100 MG PO CAPS: 100.0000 mg | ORAL_CAPSULE | Freq: Three times a day (TID) | ORAL | 0 refills | Status: DC | PRN

## 2023-01-20 MED ORDER — B COMPLEX-C PO TABS
1.0000 | ORAL_TABLET | Freq: Every day | ORAL | Status: DC
  Administered 2023-01-20: 1 via ORAL
  Filled 2023-01-20: qty 1

## 2023-01-20 NOTE — Progress Notes (Signed)
Left upper extremity venous duplex has been completed. Preliminary results can be found in CV Proc through chart review.  Results were given to Dr. Posey Pronto.   01/20/23 11:07 AM Victoria [name] RVT

## 2023-01-20 NOTE — Plan of Care (Signed)
Patient is stable for discharge. Discharge instructions have been given. All questions answered, patient is discharged home with family.

## 2023-01-20 NOTE — Progress Notes (Signed)
Patient refused for 0800 CBG to be taken. MD made aware.

## 2023-01-20 NOTE — Discharge Summary (Signed)
Physician Discharge Summary   Patient: Victoria [name] MRN: [id number] DOB: May 09, 1941  Admit date:     01/13/2023  Discharge date: 01/20/2023  Discharge Physician: Berle Mull  PCP: Elby Showers, MD  Recommendations at discharge: Follow-up with oncology and PCP. Check CBC in 1 week.   Follow-up Information     Baxley, Cresenciano Lick, MD. Schedule an appointment as soon as possible for a visit in 1 week(s).   Specialty: Internal Medicine Why: repeat CBC Contact information: [address] [place] [id number] [phone]         Truitt Merle, MD. Schedule an appointment as soon as possible for a visit in 1 week(s).   Specialties: Hematology, Oncology Contact information: [place] [place] [address] [phone]                Discharge Diagnoses: Principal Problem:   Atypical pneumonia Active Problems:   Diabetes mellitus ([place])   History of breast cancer   Anxiety   Thrombocytopenia ([place])   Cirrhosis ([place])   Hepatitis C, chronic ([place])   Status post cholecystectomy   Acute pulmonary embolism ([place])   Hypoxia   Pancreatic cancer ([place])   Hemoptysis   Epistaxis   Acute blood loss anemia  Hospital Course: Patient with PMH of right breast cancer, HTN, type II DM with history of pancreatic lesion presented to the hospital with complaints of cough with blood. 11/21/2022 - 11/26/2022 admitted for chest pain found to have submassive PE.  Discharged on oral Eliquis.  EUS on 12/4 with biopsy which was negative. 1/5 seen in the ER for abdominal pain after pain control was discharged home. 1/18 underwent EUS.  Held Eliquis for 2 days. 1/22 presents with complaints of hemoptysis this admission with dizziness, shortness of breath.  CT chest negative for PE.  CT abdomen concerning for pancreatic malignancy.  CT chest showed groundglass opacities concerning for atypical infection.  Due to hypoxia patient was referred for admission and started on  antibiotics.  Found to have acute anemia with hemoglobin dropping from 11-8.  Also had acute thrombocytopenia.  Anticoagulation was on hold. 1/23 pathology from the EUS on 1/18 came back positive for adenocarcinoma.  Oncology was consulted.  Patient was recommended to initiate gemcitabine outpatient. 1/25 started on Eliquis again.  Platelet count started recovering.  Hemoglobin remained stable.  No further epistaxis. 1/26 a midline catheter was placed due to poor IV access. 1/29 ultrasound Doppler of upper extremity shows positive DVT in left subclavian, left axillary vein as well as SVT in the left basilic vein.  Nonocclusive.  Patient does not have any pain.  Pulses palpated well.  Patient was discharged home on oral anticoagulation.  Assessment and Plan  Atypical pneumonia Acute hypoxia without any respiratory failure. Presents with hemoptysis. CT PE protocol negative for any pulmonary embolism. Shows evidence of groundglass opacities concerning for atypical pneumonia. Patient was treated with IV ceftriaxone and azithromycin for 5 days. Currently lung examination unremarkable.  Patient currently on room air. COVID-negative.  Influenza negative.  RSV negative.  No blood cultures performed. No further evidence of fever.  Legionella and strep pneumonia negative.  Hemoptysis. Epistaxis. Etiology not clear.  Possible in the setting of thrombocytopenia. Patient was able to tolerate anticoagulation for 2 to 3 days without any epistaxis in the hospital.  Acute blood loss anemia. Hemoglobin in December 2023 around 10-11. Hemoglobin on admission 9.3. Hemoglobin dropped down to 7 and remained stable at around 7 for last 4 days before  discharge. Likely this is bleeding in the setting of hemoptysis and epistaxis. Possible bleeding in the subcutaneous tissue on the left upper extremity as evidenced by multiple bruising is also likely cause of anemia. We will provide oral iron therapy and vitamin  therapy. Outpatient follow-up with hematology recommended in 1 week.  Left upper extremity DVT. Suspect multifactorial etiology. Patient had a PE diagnosed in December 2023 and was on anticoagulation with Eliquis and has been compliant. Does have a malignancy which increases the risk for PE. Unsure if she had an IV placed for her EUS in January, 2024 in the same arm. But reportedly did have multiple sticks required during this hospitalization and ended up with a midline placement on 1/29. Platelet count on admission was 44, making the case for consumption thrombocytopenia due to an acute blood clot and actually started recovering after initiation of the anticoagulation. Regardless patient is currently on oral anticoagulation.  I do not think that that this is an Eliquis failure due to above discussion. Continue Eliquis.  Do not think the patient requires reloading of Eliquis as well as it would not be safe in the patient with hemoptysis and epistaxis and anemia for higher dose of anticoagulation.  Acute thrombocytopenia. As above secondary to consumption due to acute DVT. Monitor.  Cancer related pain. Pancreatic adenocarcinoma. Retroperitoneal lymphadenopathy. Possibility of metastatic disease cannot be ruled out. Oncology was consulted. Outpatient follow-up recommended. Patient is on Strawberry which I will continue.  Cough. Will provide cough suppression medication on discharge.  Type 2 diabetes mellitus.  Controlled without any long-term insulin use without any complication On metformin at home.  Resume.  History of breast cancer-outpatient follow-up with oncology. Anxiety-resume home regimen. Cirrhosis (HCC)-outpatient follow-up with GI.  No evidence of decompensation. Hepatitis C, chronic ([place]) completed treatment with Harvoni with SVR. Status post cholecystectomy  Pain control - [place] Controlled Substance Reporting System database was reviewed. and patient was  instructed, not to drive, operate heavy machinery, perform activities at heights, swimming or participation in water activities or provide baby-sitting services while on Pain, Sleep and Anxiety Medications; until their outpatient Physician has advised to do so again. Also recommended to not to take more than prescribed Pain, Sleep and Anxiety Medications.  Consultants:  Oncology GI  Procedures performed:  Upper extremity Dopplers  DISCHARGE MEDICATION: Allergies as of 01/20/2023       Reactions   Lisinopril Cough        Medication List     STOP taking these medications    amLODipine 2.5 MG tablet Commonly known as: NORVASC       TAKE these medications    albuterol 108 (90 Base) MCG/ACT inhaler Commonly known as: VENTOLIN HFA Inhale 2 puffs into the lungs every 6 (six) hours as needed for wheezing or shortness of breath.   ALPRAZolam 0.5 MG tablet Commonly known as: XANAX TAKE 1 TABLET(0.5 MG) BY MOUTH TWICE DAILY AS NEEDED FOR ANXIETY What changed: See the new instructions.   apixaban 5 MG Tabs tablet Commonly known as: ELIQUIS Take 1 tablet (5 mg total) by mouth 2 (two) times daily.   B-complex with vitamin C tablet Take 1 tablet by mouth daily.   benzonatate 100 MG capsule Commonly known as: Tessalon Perles Take 1 capsule (100 mg total) by mouth 3 (three) times daily as needed for cough.   cetirizine 10 MG tablet Commonly known as: ZYRTEC Take 10 mg by mouth daily as needed (allergies).   chlorpheniramine-HYDROcodone 10-8 MG/5ML Commonly known  as: TUSSIONEX Take 5 mLs by mouth every 12 (twelve) hours as needed for cough.   cholecalciferol 25 MCG (1000 UNIT) tablet Commonly known as: VITAMIN D3 Take 1,000 Units by mouth in the morning.   docusate sodium 100 MG capsule Commonly known as: COLACE Take 1 capsule (100 mg total) by mouth daily. What changed:  when to take this reasons to take this   ferrous sulfate 325 (65 FE) MG tablet Take 1 tablet  (325 mg total) by mouth daily.   freestyle lancets CHECK GLUCOSE TWICE A DAY   guaiFENesin 600 MG 12 hr tablet Commonly known as: Mucinex Take 1 tablet (600 mg total) by mouth 2 (two) times daily.   HYDROcodone-acetaminophen 5-325 MG tablet Commonly known as: NORCO/VICODIN Take 1 tablet by mouth every 6 (six) hours as needed. What changed: reasons to take this   loperamide 2 MG capsule Commonly known as: IMODIUM Take 2-4 mg by mouth 3 (three) times daily as needed for diarrhea or loose stools.   losartan-hydrochlorothiazide 100-25 MG tablet Commonly known as: HYZAAR Take 1 tablet by mouth daily.   metFORMIN 500 MG tablet Commonly known as: GLUCOPHAGE TAKE 1 TABLET(500 MG) BY MOUTH TWICE DAILY WITH A MEAL   multivitamin with minerals Tabs tablet Take 1 tablet by mouth in the morning.   oxymetazoline 0.05 % nasal spray Commonly known as: AFRIN Place 2 sprays into both nostrils 2 (two) times daily as needed for congestion.   pantoprazole 40 MG tablet Commonly known as: Protonix Take 1 tablet (40 mg total) by mouth daily.   True Metrix Blood Glucose Test test strip Generic drug: glucose blood TEST BLOOD SUGAR TWICE DAILY   True Metrix Meter w/Device Kit USE AS DIRECTED       Disposition: Home Diet recommendation: Regular diet  Discharge Exam: Vitals:   01/18/23 2137 01/19/23 0445 01/20/23 0531 01/20/23 0650  BP:  (!) 156/94  (!) 155/79  Pulse: (!) 104 93 95 94  Resp: '16 16 16 16  '$ Temp:  98.2 F (36.8 C) (!) 97.5 F (36.4 C)   TempSrc: Oral Oral Oral   SpO2:  95% 94% 93%  Height:       General: Appear in mild distress; no visible Abnormal Neck Mass Or lumps, Conjunctiva normal Cardiovascular: S1 and S2 Present, no Murmur, Respiratory: good respiratory effort, Bilateral Air entry present and CTA, no Crackles, no wheezes Abdomen: Bowel Sound present, Non tender  Extremities: Left upper extremity bruising and edema with good pulses, no pedal  edema Neurology: alert and oriented to time, place, and person   Condition at discharge: stable  The results of significant diagnostics from this hospitalization (including imaging, microbiology, ancillary and laboratory) are listed below for reference.   Imaging Studies: VAS [place] UPPER EXTREMITY VENOUS DUPLEX  Result Date: 01/20/2023 UPPER VENOUS STUDY  Patient Name:  LAPORCHIA NAKAJIMA  Date of Exam:   01/20/2023 Medical Rec #: [id number]          Accession #:    [id number] Date of Birth: 19-Oct-1941          Patient Gender: F Patient Age:   42 years Exam Location:  [facility] Procedure:      VAS [place] UPPER EXTREMITY VENOUS DUPLEX Referring Phys: Torryn Fiske --------------------------------------------------------------------------------  Indications: Edema Risk Factors: None identified. Limitations: Poor ultrasound/tissue interface. Comparison Study: No prior studies. Performing Technologist: Oliver Hum RVT  Examination Guidelines: A complete evaluation includes B-mode imaging, spectral Doppler, color Doppler, and power Doppler as  needed of all accessible portions of each vessel. Bilateral testing is considered an integral part of a complete examination. Limited examinations for reoccurring indications may be performed as noted.  Right Findings: +----------+------------+---------+-----------+----------+-------+ RIGHT     CompressiblePhasicitySpontaneousPropertiesSummary +----------+------------+---------+-----------+----------+-------+ Subclavian    Full       Yes       Yes                      +----------+------------+---------+-----------+----------+-------+  Left Findings: +----------+------------+---------+-----------+----------+-------+ LEFT      CompressiblePhasicitySpontaneousPropertiesSummary +----------+------------+---------+-----------+----------+-------+ IJV           Full       Yes       Yes                       +----------+------------+---------+-----------+----------+-------+ Subclavian    None       No        No                Acute  +----------+------------+---------+-----------+----------+-------+ Axillary      None       No        No                Acute  +----------+------------+---------+-----------+----------+-------+ Brachial      Full       No        No                       +----------+------------+---------+-----------+----------+-------+ Radial        Full                                          +----------+------------+---------+-----------+----------+-------+ Ulnar         Full                                          +----------+------------+---------+-----------+----------+-------+ Cephalic      Full                                          +----------+------------+---------+-----------+----------+-------+ Basilic       None                                   Acute  +----------+------------+---------+-----------+----------+-------+  Summary:  Right: No evidence of thrombosis in the subclavian.  Left: Findings consistent with acute deep vein thrombosis involving the left subclavian vein and left axillary vein. Findings consistent with acute superficial vein thrombosis involving the left basilic vein.  *See table(s) above for measurements and observations.  Diagnosing physician: Monica Martinez MD Electronically signed by Monica Martinez MD on 01/20/2023 at 5:43:57 PM.    Final    CT Angio Chest PE W and/or Wo Contrast  Result Date: 01/14/2023 CLINICAL DATA:  Abdominal pain. Also concern for pulmonary embolism. EXAM: CT ANGIOGRAPHY CHEST CT ABDOMEN AND PELVIS WITH CONTRAST TECHNIQUE: Multidetector CT imaging of the chest was performed using the standard protocol during bolus administration of intravenous contrast. Multiplanar CT image reconstructions and MIPs were obtained to evaluate the vascular anatomy. Multidetector  CT imaging of the abdomen and  pelvis was performed using the standard protocol during bolus administration of intravenous contrast. RADIATION DOSE REDUCTION: This exam was performed according to the departmental dose-optimization program which includes automated exposure control, adjustment of the mA and/or kV according to patient size and/or use of iterative reconstruction technique. CONTRAST:  149m OMNIPAQUE IOHEXOL 350 MG/ML SOLN COMPARISON:  CT abdomen pelvis dated 12/27/2022 and chest CT dated 11/21/2022. FINDINGS: Evaluation is limited due to respiratory motion and streak artifact caused by the patient's arms. CTA CHEST FINDINGS Cardiovascular: There is no cardiomegaly or pericardial effusion. Mild atherosclerotic calcification of the thoracic aorta. No aneurysmal dilatation or dissection. Evaluation of the pulmonary arteries is limited due to respiratory motion and suboptimal visualization of the peripheral branches. No obvious large central pulmonary artery embolus identified. Mediastinum/Nodes: Top-normal bilateral hilar lymph nodes measure 1 cm short axis. The esophagus is grossly unremarkable. No mediastinal fluid collection. Lungs/Pleura: Scattered clusters of ground-glass nodular densities throughout the lungs suspicious for atypical infection. Metastatic disease is not excluded clinical correlation is recommended. There is no pleural effusion or pneumothorax. The central airways are patent. Musculoskeletal: Degenerative changes of the spine and osteopenia. No acute osseous pathology. Review of the MIP images confirms the above findings. CT ABDOMEN and PELVIS FINDINGS No intra-abdominal free air or free fluid. Hepatobiliary: Cirrhosis. Mild biliary dilatation versus periportal edema. Cholecystectomy. Pancreas: Ill-defined soft tissue mass in the region of the head of the pancreas measuring approximately 4.9 x 3.5 cm (22/4). There is atrophy of the body of the pancreas and dilatation of the main pancreatic duct. No active  inflammatory changes. Spleen: Normal in size without focal abnormality. Adrenals/Urinary Tract: The adrenal glands unremarkable. Small bilateral renal cysts. There is no hydronephrosis on either side. There is symmetric enhancement and excretion of contrast by both kidneys. The visualized ureters and urinary bladder appear unremarkable. Stomach/Bowel: Severe colonic diverticulosis without active inflammatory changes. There is no bowel obstruction active inflammation. The appendix is normal. Vascular/Lymphatic: Moderate aortoiliac atherosclerotic disease. The IVC is unremarkable. No portal venous gas. Retroperitoneal adenopathy measures 13 mm to the left of the aorta, 17 mm in the periportal region, and 19 mm in the gastrohepatic vision. Reproductive: Hysterectomy.  No adnexal masses. Other: Small fat containing umbilical and supraumbilical hernia. Musculoskeletal: Degenerative changes of the spine. No acute osseous pathology. Review of the MIP images confirms the above findings. IMPRESSION: 1. No CT evidence of central pulmonary artery embolus. 2. Scattered clusters of ground-glass nodular densities throughout the lungs may represent atypical infection or metastatic disease. 3. Ill-defined soft tissue mass in the region of the head of the pancreas with atrophy of the body of the pancreas and dilatation of the main pancreatic duct. Findings are concerning for pancreatic malignancy. Further evaluation with MRI with and without contrast recommended. 4. Retroperitoneal adenopathy. 5. Cirrhosis. 6. Severe colonic diverticulosis without active inflammatory changes. No bowel obstruction. Normal appendix. 7.  Aortic Atherosclerosis (ICD10-I70.0). Electronically Signed   By: AAnner CreteM.D.   On: 01/14/2023 00:04   CT ABDOMEN PELVIS W CONTRAST  Result Date: 01/14/2023 CLINICAL DATA:  Abdominal pain. Also concern for pulmonary embolism. EXAM: CT ANGIOGRAPHY CHEST CT ABDOMEN AND PELVIS WITH CONTRAST TECHNIQUE:  Multidetector CT imaging of the chest was performed using the standard protocol during bolus administration of intravenous contrast. Multiplanar CT image reconstructions and MIPs were obtained to evaluate the vascular anatomy. Multidetector CT imaging of the abdomen and pelvis was performed using the standard protocol during bolus administration of  intravenous contrast. RADIATION DOSE REDUCTION: This exam was performed according to the departmental dose-optimization program which includes automated exposure control, adjustment of the mA and/or kV according to patient size and/or use of iterative reconstruction technique. CONTRAST:  174m OMNIPAQUE IOHEXOL 350 MG/ML SOLN COMPARISON:  CT abdomen pelvis dated 12/27/2022 and chest CT dated 11/21/2022. FINDINGS: Evaluation is limited due to respiratory motion and streak artifact caused by the patient's arms. CTA CHEST FINDINGS Cardiovascular: There is no cardiomegaly or pericardial effusion. Mild atherosclerotic calcification of the thoracic aorta. No aneurysmal dilatation or dissection. Evaluation of the pulmonary arteries is limited due to respiratory motion and suboptimal visualization of the peripheral branches. No obvious large central pulmonary artery embolus identified. Mediastinum/Nodes: Top-normal bilateral hilar lymph nodes measure 1 cm short axis. The esophagus is grossly unremarkable. No mediastinal fluid collection. Lungs/Pleura: Scattered clusters of ground-glass nodular densities throughout the lungs suspicious for atypical infection. Metastatic disease is not excluded clinical correlation is recommended. There is no pleural effusion or pneumothorax. The central airways are patent. Musculoskeletal: Degenerative changes of the spine and osteopenia. No acute osseous pathology. Review of the MIP images confirms the above findings. CT ABDOMEN and PELVIS FINDINGS No intra-abdominal free air or free fluid. Hepatobiliary: Cirrhosis. Mild biliary dilatation versus  periportal edema. Cholecystectomy. Pancreas: Ill-defined soft tissue mass in the region of the head of the pancreas measuring approximately 4.9 x 3.5 cm (22/4). There is atrophy of the body of the pancreas and dilatation of the main pancreatic duct. No active inflammatory changes. Spleen: Normal in size without focal abnormality. Adrenals/Urinary Tract: The adrenal glands unremarkable. Small bilateral renal cysts. There is no hydronephrosis on either side. There is symmetric enhancement and excretion of contrast by both kidneys. The visualized ureters and urinary bladder appear unremarkable. Stomach/Bowel: Severe colonic diverticulosis without active inflammatory changes. There is no bowel obstruction active inflammation. The appendix is normal. Vascular/Lymphatic: Moderate aortoiliac atherosclerotic disease. The IVC is unremarkable. No portal venous gas. Retroperitoneal adenopathy measures 13 mm to the left of the aorta, 17 mm in the periportal region, and 19 mm in the gastrohepatic vision. Reproductive: Hysterectomy.  No adnexal masses. Other: Small fat containing umbilical and supraumbilical hernia. Musculoskeletal: Degenerative changes of the spine. No acute osseous pathology. Review of the MIP images confirms the above findings. IMPRESSION: 1. No CT evidence of central pulmonary artery embolus. 2. Scattered clusters of ground-glass nodular densities throughout the lungs may represent atypical infection or metastatic disease. 3. Ill-defined soft tissue mass in the region of the head of the pancreas with atrophy of the body of the pancreas and dilatation of the main pancreatic duct. Findings are concerning for pancreatic malignancy. Further evaluation with MRI with and without contrast recommended. 4. Retroperitoneal adenopathy. 5. Cirrhosis. 6. Severe colonic diverticulosis without active inflammatory changes. No bowel obstruction. Normal appendix. 7.  Aortic Atherosclerosis (ICD10-I70.0). Electronically Signed    By: AAnner CreteM.D.   On: 01/14/2023 00:04   CT ABDOMEN PELVIS W CONTRAST  Result Date: 12/27/2022 CLINICAL DATA:  Acute severe abdominal pain EXAM: CT ABDOMEN AND PELVIS WITH CONTRAST TECHNIQUE: Multidetector CT imaging of the abdomen and pelvis was performed using the standard protocol following bolus administration of intravenous contrast. RADIATION DOSE REDUCTION: This exam was performed according to the departmental dose-optimization program which includes automated exposure control, adjustment of the mA and/or kV according to patient size and/or use of iterative reconstruction technique. CONTRAST:  761mOMNIPAQUE IOHEXOL 350 MG/ML SOLN COMPARISON:  Previous CT done on 06/07/2013 and MR abdomen done  on 11/21/2022 FINDINGS: Lower chest: There are numerous new small patchy nodular densities in both lower lung fields. There is ectasia of bronchi in the lower lung fields. There is small Bochdalek's hernia in right hemidiaphragm containing fat. Hepatobiliary: There is nodularity in liver surface. No focal abnormalities are seen. Surgical clips are seen in gallbladder fossa. There is no dilation of bile ducts. Pancreas: There is marked dilation of pancreatic duct in the body and tail measuring up to 1.4 cm. There is abrupt change in caliber of pancreatic duct in the head of the pancreas without significant dilation. There is marked parenchymal atrophy in the body and tail of pancreas. There is patchy attenuation in the head of the pancreas without discrete measurable nodule/mass. Spleen: Unremarkable. Adrenals/Urinary Tract: Adrenals are unremarkable. There is no hydronephrosis. There are no renal or ureteral stones. There are few smooth marginated low-density lesions in both kidneys measuring up to 2 cm in size suggesting renal cysts. No follow-up imaging is recommended. Ureters are not dilated. Urinary bladder is not distended. Stomach/Bowel: Stomach is not distended. Small bowel loops are unremarkable.  Appendix is not dilated. Tip of appendix is noted in right pelvic cavity slightly below the level of pelvic inlet. Multiple diverticula are seen in colon. There is no evidence of focal acute diverticulitis. Vascular/Lymphatic: Scattered arterial calcifications are seen. There are abnormally enlarged lymph nodes adjacent to put a bodies, adjacent to head of pancreas and retroperitoneum measuring up to 1.5 cm in short axis. Reproductive: Uterus is not seen. Other: There is no ascites or pneumoperitoneum. Umbilical hernia containing fat is seen. Bilateral inguinal hernias containing fat are noted. Musculoskeletal: Degenerative changes are noted in lower thoracic spine and lumbar spine. IMPRESSION: There is no evidence of intestinal obstruction or pneumoperitoneum. There is no hydronephrosis. Appendix is not dilated. There is marked dilation of pancreatic duct in the body and tail with abrupt change in caliber in the neck. Possibility of an obstructing neoplastic process in pancreas should be considered. There are abnormally enlarged lymph nodes adjacent to porta hepatis, adjacent to the head of the pancreas, lesser curvature aspect of stomach and retroperitoneum suggesting possible metastatic lymphadenopathy. There are numerous patchy nodular densities of varying sizes scattered throughout the visualized lower lung fields. This finding may suggest pulmonary metastatic disease. Less likely possibility would be multifocal pneumonia. There is nodularity in liver surface suggesting cirrhosis. No focal abnormalities are seen in liver. Diverticulosis of colon without signs of focal acute diverticulitis. Other findings as described in the body of the report. There is marked dilation of pancreatic duct in the body and tail with abrupt change in caliber in the neck of the pancreas. Possibility of a benign or malignant stricture due to neoplastic process is not excluded. Electronically Signed   By: Elmer Picker M.D.   On:  12/27/2022 13:57    Microbiology: Results for orders placed or performed during the hospital encounter of 01/13/23  Resp panel by RT-PCR (RSV, Flu A&B, Covid) Anterior Nasal Swab     Status: None   Collection Time: 01/13/23  9:39 PM   Specimen: Anterior Nasal Swab  Result Value Ref Range Status   SARS Coronavirus 2 by RT PCR NEGATIVE NEGATIVE Final    Comment: (NOTE) SARS-CoV-2 target nucleic acids are NOT DETECTED.  The SARS-CoV-2 RNA is generally detectable in upper respiratory specimens during the acute phase of infection. The lowest concentration of SARS-CoV-2 viral copies this assay can detect is 138 copies/mL. A negative result does not preclude SARS-Cov-2  infection and should not be used as the sole basis for treatment or other patient management decisions. A negative result may occur with  improper specimen collection/handling, submission of specimen other than nasopharyngeal swab, presence of viral mutation(s) within the areas targeted by this assay, and inadequate number of viral copies(<138 copies/mL). A negative result must be combined with clinical observations, patient history, and epidemiological information. The expected result is Negative.  Fact Sheet for Patients:  EntrepreneurPulse.com.au  Fact Sheet for Healthcare Providers:  IncredibleEmployment.be  This test is no t yet approved or cleared by the [place] FDA and  has been authorized for detection and/or diagnosis of SARS-CoV-2 by FDA under an Emergency Use Authorization (EUA). This EUA will remain  in effect (meaning this test can be used) for the duration of the COVID-19 declaration under Section 564(b)(1) of the Act, 21 U.S.C.section 360bbb-3(b)(1), unless the authorization is terminated  or revoked sooner.       Influenza A by PCR NEGATIVE NEGATIVE Final   Influenza B by PCR NEGATIVE NEGATIVE Final    Comment: (NOTE) The Xpert Xpress SARS-CoV-2/FLU/RSV plus  assay is intended as an aid in the diagnosis of influenza from Nasopharyngeal swab specimens and should not be used as a sole basis for treatment. Nasal washings and aspirates are unacceptable for Xpert Xpress SARS-CoV-2/FLU/RSV testing.  Fact Sheet for Patients: EntrepreneurPulse.com.au  Fact Sheet for Healthcare Providers: IncredibleEmployment.be  This test is not yet approved or cleared by the [place] FDA and has been authorized for detection and/or diagnosis of SARS-CoV-2 by FDA under an Emergency Use Authorization (EUA). This EUA will remain in effect (meaning this test can be used) for the duration of the COVID-19 declaration under Section 564(b)(1) of the Act, 21 U.S.C. section 360bbb-3(b)(1), unless the authorization is terminated or revoked.     Resp Syncytial Virus by PCR NEGATIVE NEGATIVE Final    Comment: (NOTE) Fact Sheet for Patients: EntrepreneurPulse.com.au  Fact Sheet for Healthcare Providers: IncredibleEmployment.be  This test is not yet approved or cleared by the [place] FDA and has been authorized for detection and/or diagnosis of SARS-CoV-2 by FDA under an Emergency Use Authorization (EUA). This EUA will remain in effect (meaning this test can be used) for the duration of the COVID-19 declaration under Section 564(b)(1) of the Act, 21 U.S.C. section 360bbb-3(b)(1), unless the authorization is terminated or revoked.  Performed at [facility], [place] [address], [place], [place] [address]    Labs: CBC: Recent Labs  Lab 01/16/23 [phone] 01/17/23 0446 01/18/23 0152 01/19/23 0248 01/20/23 0302  WBC 7.1 6.7 7.1 6.8 6.7  NEUTROABS 4.1 4.1 4.6 4.4 4.4  HGB 8.5* 7.9* 7.4* 7.4* 7.3*  HCT 25.9* 24.5* 23.5* 23.3* 23.9*  MCV 84.6 86.3 86.7 87.9 92.3  PLT 66* 79* 105* 115* 324*   Basic Metabolic Panel: Recent Labs  Lab 01/13/23 2134 01/15/23 0446  01/16/23 0442  NA 135 138 137  K 3.4* 3.8 4.1  CL 98 106 101  CO2 23 21* 26  GLUCOSE 193* 175* 136*  BUN '13 17 14  '$ CREATININE 1.07* 0.85 0.85  CALCIUM 8.8* 8.0* 8.3*   Liver Function Tests: Recent Labs  Lab 01/13/23 2134 01/15/23 0446 01/16/23 0442  AST 37 37 46*  ALT 25 26 32  ALKPHOS 80 81 88  BILITOT 0.7 0.4 0.7  PROT 7.2 6.0* 6.2*  ALBUMIN 3.5 2.7* 2.9*   CBG: Recent Labs  Lab 01/18/23 2130 01/19/23 0728 01/19/23 1124 01/19/23 2204 01/20/23 1133  GLUCAP 152* 152* 135* 125* 192*    Discharge time spent: greater than 30 minutes.  Signed: Berle Mull, MD [facility] 01/20/2023

## 2023-01-21 ENCOUNTER — Telehealth: Payer: Self-pay

## 2023-01-21 ENCOUNTER — Telehealth: Payer: Self-pay | Admitting: *Deleted

## 2023-01-21 NOTE — Patient Outreach (Addendum)
  Care Coordination TOC Note Transition Care Management Follow-up Telephone Call Date of discharge and from where: 01/20/23-[facility]  Dx: "anemia" How have you been since you were released from the hospital? Patient voices she is "feeling and doing good." Denies any acute issues or concerns. States she rested well last night. She is eating breakfast now. No GI issues.  Any questions or concerns? No  Items Reviewed: Did the pt receive and understand the discharge instructions provided? Yes  Medications obtained and verified? Yes -Discussed meds with patient but she voices she has not picked them up from pharmacy-going to get them today Other? No  Any new allergies since your discharge? No  Dietary orders reviewed? Yes Do you have support at home? Yes   Home Care and Equipment/Supplies: Were home health services ordered? not applicable If so, what is the name of the agency? N/A  Has the agency set up a time to come to the patient's home? not applicable Were any new equipment or medical supplies ordered?  No What is the name of the medical supply agency? N/A Were you able to get the supplies/equipment? not applicable Do you have any questions related to the use of the equipment or supplies? No  Functional Questionnaire: (I = Independent and D = Dependent) ADLs: I  Bathing/Dressing- I  Meal Prep- I  Eating- I  Maintaining continence- I  Transferring/Ambulation- I  Managing Meds- I  Follow up appointments reviewed:  PCP Hospital f/u appt confirmed? No  -Patient agreeable to care guide calling to assist with scheduling. [facility] f/u appt confirmed? No  -Patient to call Dr. Ernestina Penna office to make apt in one week. Are transportation arrangements needed? No  If their condition worsens, is the pt aware to call PCP or go to the Emergency Dept.? Yes Was the patient provided with contact information for the PCP's office or ED? Yes Was to pt encouraged to call back  with questions or concerns? Yes  SDOH assessments and interventions completed:   Yes SDOH Interventions Today    Flowsheet Row Most Recent Value  SDOH Interventions   Food Insecurity Interventions Intervention Not Indicated  Transportation Interventions Intervention Not Indicated       Care Coordination Interventions:  Interventions Today    Flowsheet Row Most Recent Value  Education Interventions   Education Provided Provided Verbal Education  Provided Verbal Education On Nutrition, Medication, When to see the doctor  Nutrition Interventions   Nutrition Discussed/Reviewed Nutrition Discussed  Pharmacy Interventions   Pharmacy Dicussed/Reviewed Medications and their functions  Safety Interventions   Safety Discussed/Reviewed Safety Discussed      TOC Interventions Today    Flowsheet Row Most Recent Value  TOC Interventions   TOC Interventions Discussed/Reviewed Arranged PCP follow up within 7 days/Care Guide scheduled        Encounter Outcome:  Pt. Visit Completed    Enzo Montgomery, RN,BSN,CCM [facility] Telephonic Care Management Coordinator Direct Phone: [phone] Toll Free: [phone] Fax: [phone]

## 2023-01-21 NOTE — Progress Notes (Signed)
  Care Coordination  Note  01/21/2023 Name: Victoria [name] MRN: [id number] DOB: July 07, 1941  Victoria [name] is a 82 y.o. year old primary care patient of Baxley, Cresenciano Lick, MD. I reached out to Janann August by phone today to assist with scheduling a follow up appointment. Janann August verbally consented to my assistance.       Follow up plan: Hospital Follow Up appointment scheduled with (Dr Renold Genta) on (01/24/2023) at (12pm).  Julian Hy, [place] Direct Dial: [phone]

## 2023-01-22 ENCOUNTER — Inpatient Hospital Stay: Payer: Medicare HMO | Attending: Nurse Practitioner | Admitting: Licensed Clinical Social Worker

## 2023-01-22 ENCOUNTER — Other Ambulatory Visit: Payer: Self-pay

## 2023-01-22 ENCOUNTER — Other Ambulatory Visit: Payer: Self-pay | Admitting: Hematology

## 2023-01-22 DIAGNOSIS — C259 Malignant neoplasm of pancreas, unspecified: Secondary | ICD-10-CM

## 2023-01-22 NOTE — Progress Notes (Signed)
[place] Work  Initial Assessment   Victoria [name] is a 82 y.o. year old female contacted by phone. Clinical Social Work was referred by medical provider for assessment of psychosocial needs.   SDOH (Social Determinants of Health) assessments performed: Yes SDOH Interventions    Flowsheet Row Telephone from 01/21/2023 in [place] Coordination Clinical Support from 12/03/2021 in [facility], MD  SDOH Interventions    Food Insecurity Interventions Intervention Not Indicated Intervention Not Indicated  Housing Interventions -- Intervention Not Indicated  Transportation Interventions Intervention Not Indicated Intervention Not Indicated  Financial Strain Interventions -- Intervention Not Indicated  Physical Activity Interventions -- Intervention Not Indicated  Stress Interventions -- Intervention Not Indicated  Social Connections Interventions -- Intervention Not Indicated       SDOH Screenings   Food Insecurity: No Food Insecurity (01/21/2023)  Housing: Low Risk  (11/22/2022)  Transportation Needs: No Transportation Needs (01/21/2023)  Utilities: Not At Risk (11/22/2022)  Alcohol Screen: Low Risk  (12/03/2021)  Depression (PHQ2-9): Low Risk  (12/04/2022)  Financial Resource Strain: Medium Risk (12/03/2021)  Physical Activity: Sufficiently Active (12/03/2021)  Social Connections: Moderately Isolated (12/03/2021)  Stress: No Stress Concern Present (12/03/2021)  Tobacco Use: Low Risk  (01/12/2023)     Distress Screen completed: No     No data to display            Family/Social Information:  Housing Arrangement: patient lives alone, pt states she is independent in ADLs and prior to recent hospitalization was driving herself.  At present she is physically deconditioned but states she is feeling a bit stronger each day. Family members/support persons in your life? Pt states her brother and 1 of her sons (pt has 3 children) reside  locally and have been assisting w/ transportation as needed; however, they both work and it is unlikely they will be able to transport her to all treatment appointments.  Pt states they will be able to provide support at home should she need following treatment. Transportation concerns: yes, once treatment starts  Employment: Retired .  Income source: Paediatric nurse concerns: Yes, current concerns Type of concern: Utilities Food access concerns: no Religious or spiritual practice: Not known Services Currently in place:  none  Coping/ Adjustment to diagnosis: Patient understands treatment plan and what happens next? Pt recently discharged from the hospital and follow up/start of treatment has not yet been scheduled Concerns about diagnosis and/or treatment: How I will pay for the services I need, How will I care for myself, and Quality of life Patient reported stressors: Finances and Adjusting to my illness Hopes and/or priorities: Pt's priority is to start treatment w/ the hope of positive results Patient enjoys  not discussed Current coping skills/ strengths: Motivation for treatment/growth  and Supportive family/friends     SUMMARY: Current SDOH Barriers:  Financial constraints related to fixed income and recent increase in bills  Clinical Social Work Clinical Goal(s):  Explore community resource options for unmet needs related to:  Financial Strain   Interventions: Discussed common feeling and emotions when being diagnosed with cancer, and the importance of support during treatment Informed patient of the support team roles and support services at [facility] Provided [place] contact information and encouraged patient to call with any questions or concerns Once treatment has been scheduled pt will be referred for transportation concerns as well as for the [employer].   Follow Up Plan:  CSW to follow up w/ transportation and [facility] Patient verbalizes  understanding of plan: Yes    Henriette Combs, LCSW

## 2023-01-23 ENCOUNTER — Other Ambulatory Visit: Payer: Self-pay

## 2023-01-23 ENCOUNTER — Other Ambulatory Visit: Payer: Self-pay | Admitting: Genetic Counselor

## 2023-01-23 DIAGNOSIS — C259 Malignant neoplasm of pancreas, unspecified: Secondary | ICD-10-CM

## 2023-01-23 DIAGNOSIS — Z1379 Encounter for other screening for genetic and chromosomal anomalies: Secondary | ICD-10-CM

## 2023-01-24 ENCOUNTER — Other Ambulatory Visit: Payer: Self-pay

## 2023-01-24 ENCOUNTER — Inpatient Hospital Stay: Payer: Medicare HMO | Admitting: Internal Medicine

## 2023-01-24 ENCOUNTER — Emergency Department (HOSPITAL_COMMUNITY): Payer: Medicare HMO

## 2023-01-24 ENCOUNTER — Inpatient Hospital Stay (HOSPITAL_COMMUNITY)
Admission: EM | Admit: 2023-01-24 | Discharge: 2023-02-04 | DRG: 193 | Disposition: A | Discharge status: Home / Self Care | Payer: Medicare HMO | Attending: Internal Medicine | Admitting: Internal Medicine

## 2023-01-24 ENCOUNTER — Encounter (HOSPITAL_COMMUNITY): Payer: Self-pay | Admitting: Internal Medicine

## 2023-01-24 DIAGNOSIS — K219 Gastro-esophageal reflux disease without esophagitis: Secondary | ICD-10-CM | POA: Diagnosis present

## 2023-01-24 DIAGNOSIS — Z9049 Acquired absence of other specified parts of digestive tract: Secondary | ICD-10-CM

## 2023-01-24 DIAGNOSIS — J9601 Acute respiratory failure with hypoxia: Secondary | ICD-10-CM | POA: Diagnosis present

## 2023-01-24 DIAGNOSIS — E8809 Other disorders of plasma-protein metabolism, not elsewhere classified: Secondary | ICD-10-CM | POA: Diagnosis not present

## 2023-01-24 DIAGNOSIS — Z7901 Long term (current) use of anticoagulants: Secondary | ICD-10-CM

## 2023-01-24 DIAGNOSIS — Z6828 Body mass index (BMI) 28.0-28.9, adult: Secondary | ICD-10-CM

## 2023-01-24 DIAGNOSIS — Z79899 Other long term (current) drug therapy: Secondary | ICD-10-CM

## 2023-01-24 DIAGNOSIS — J188 Other pneumonia, unspecified organism: Secondary | ICD-10-CM | POA: Diagnosis present

## 2023-01-24 DIAGNOSIS — D649 Anemia, unspecified: Secondary | ICD-10-CM | POA: Diagnosis not present

## 2023-01-24 DIAGNOSIS — K7460 Unspecified cirrhosis of liver: Secondary | ICD-10-CM | POA: Diagnosis present

## 2023-01-24 DIAGNOSIS — I5033 Acute on chronic diastolic (congestive) heart failure: Secondary | ICD-10-CM | POA: Diagnosis present

## 2023-01-24 DIAGNOSIS — I509 Heart failure, unspecified: Secondary | ICD-10-CM | POA: Diagnosis not present

## 2023-01-24 DIAGNOSIS — I10 Essential (primary) hypertension: Secondary | ICD-10-CM | POA: Diagnosis present

## 2023-01-24 DIAGNOSIS — I110 Hypertensive heart disease with heart failure: Secondary | ICD-10-CM | POA: Diagnosis present

## 2023-01-24 DIAGNOSIS — Z853 Personal history of malignant neoplasm of breast: Secondary | ICD-10-CM

## 2023-01-24 DIAGNOSIS — R069 Unspecified abnormalities of breathing: Secondary | ICD-10-CM | POA: Diagnosis not present

## 2023-01-24 DIAGNOSIS — R531 Weakness: Secondary | ICD-10-CM | POA: Diagnosis not present

## 2023-01-24 DIAGNOSIS — E663 Overweight: Secondary | ICD-10-CM | POA: Diagnosis present

## 2023-01-24 DIAGNOSIS — J189 Pneumonia, unspecified organism: Principal | ICD-10-CM | POA: Diagnosis present

## 2023-01-24 DIAGNOSIS — E440 Moderate protein-calorie malnutrition: Secondary | ICD-10-CM | POA: Diagnosis present

## 2023-01-24 DIAGNOSIS — J302 Other seasonal allergic rhinitis: Secondary | ICD-10-CM | POA: Diagnosis present

## 2023-01-24 DIAGNOSIS — G893 Neoplasm related pain (acute) (chronic): Secondary | ICD-10-CM | POA: Diagnosis present

## 2023-01-24 DIAGNOSIS — Z86718 Personal history of other venous thrombosis and embolism: Secondary | ICD-10-CM | POA: Diagnosis not present

## 2023-01-24 DIAGNOSIS — Z9841 Cataract extraction status, right eye: Secondary | ICD-10-CM

## 2023-01-24 DIAGNOSIS — I5031 Acute diastolic (congestive) heart failure: Secondary | ICD-10-CM

## 2023-01-24 DIAGNOSIS — E119 Type 2 diabetes mellitus without complications: Secondary | ICD-10-CM

## 2023-01-24 DIAGNOSIS — Z923 Personal history of irradiation: Secondary | ICD-10-CM

## 2023-01-24 DIAGNOSIS — J90 Pleural effusion, not elsewhere classified: Secondary | ICD-10-CM | POA: Diagnosis not present

## 2023-01-24 DIAGNOSIS — E890 Postprocedural hypothyroidism: Secondary | ICD-10-CM | POA: Diagnosis present

## 2023-01-24 DIAGNOSIS — T502X5A Adverse effect of carbonic-anhydrase inhibitors, benzothiadiazides and other diuretics, initial encounter: Secondary | ICD-10-CM | POA: Diagnosis not present

## 2023-01-24 DIAGNOSIS — E876 Hypokalemia: Secondary | ICD-10-CM | POA: Diagnosis not present

## 2023-01-24 DIAGNOSIS — Z7189 Other specified counseling: Secondary | ICD-10-CM | POA: Diagnosis not present

## 2023-01-24 DIAGNOSIS — D638 Anemia in other chronic diseases classified elsewhere: Secondary | ICD-10-CM | POA: Diagnosis present

## 2023-01-24 DIAGNOSIS — Z1152 Encounter for screening for COVID-19: Secondary | ICD-10-CM | POA: Diagnosis not present

## 2023-01-24 DIAGNOSIS — Z86711 Personal history of pulmonary embolism: Secondary | ICD-10-CM | POA: Diagnosis not present

## 2023-01-24 DIAGNOSIS — Z833 Family history of diabetes mellitus: Secondary | ICD-10-CM

## 2023-01-24 DIAGNOSIS — C772 Secondary and unspecified malignant neoplasm of intra-abdominal lymph nodes: Secondary | ICD-10-CM | POA: Diagnosis present

## 2023-01-24 DIAGNOSIS — F419 Anxiety disorder, unspecified: Secondary | ICD-10-CM | POA: Diagnosis present

## 2023-01-24 DIAGNOSIS — Z515 Encounter for palliative care: Secondary | ICD-10-CM | POA: Diagnosis not present

## 2023-01-24 DIAGNOSIS — R945 Abnormal results of liver function studies: Secondary | ICD-10-CM | POA: Diagnosis not present

## 2023-01-24 DIAGNOSIS — C259 Malignant neoplasm of pancreas, unspecified: Secondary | ICD-10-CM | POA: Diagnosis present

## 2023-01-24 DIAGNOSIS — Z7984 Long term (current) use of oral hypoglycemic drugs: Secondary | ICD-10-CM

## 2023-01-24 DIAGNOSIS — B1920 Unspecified viral hepatitis C without hepatic coma: Secondary | ICD-10-CM | POA: Diagnosis present

## 2023-01-24 DIAGNOSIS — Z888 Allergy status to other drugs, medicaments and biological substances status: Secondary | ICD-10-CM

## 2023-01-24 DIAGNOSIS — R0602 Shortness of breath: Secondary | ICD-10-CM

## 2023-01-24 DIAGNOSIS — R000 Tachycardia, unspecified: Secondary | ICD-10-CM | POA: Diagnosis not present

## 2023-01-24 DIAGNOSIS — Z9842 Cataract extraction status, left eye: Secondary | ICD-10-CM

## 2023-01-24 LAB — RESPIRATORY PANEL BY PCR

## 2023-01-24 LAB — CBC WITH DIFFERENTIAL/PLATELET
Abs Immature Granulocytes: 0.03 10*3/uL (ref 0.00–0.07)
Basophils Absolute: 0 10*3/uL (ref 0.0–0.1)
Basophils Relative: 0 %
Eosinophils Absolute: 0.1 10*3/uL (ref 0.0–0.5)
Eosinophils Relative: 1 %
HCT: 25.4 % — ABNORMAL LOW (ref 36.0–46.0)
Hemoglobin: 8 g/dL — ABNORMAL LOW (ref 12.0–15.0)
Immature Granulocytes: 0 %
Lymphocytes Relative: 14 %
Lymphs Abs: 1.1 10*3/uL (ref 0.7–4.0)
MCH: 27.8 pg (ref 26.0–34.0)
MCHC: 31.5 g/dL (ref 30.0–36.0)
MCV: 88.2 fL (ref 80.0–100.0)
Monocytes Absolute: 0.6 10*3/uL (ref 0.1–1.0)
Monocytes Relative: 8 %
Neutro Abs: 6.4 10*3/uL (ref 1.7–7.7)
Neutrophils Relative %: 77 %
Platelets: 210 10*3/uL (ref 150–400)
RBC: 2.88 MIL/uL — ABNORMAL LOW (ref 3.87–5.11)
RDW: 18.5 % — ABNORMAL HIGH (ref 11.5–15.5)
WBC: 8.3 10*3/uL (ref 4.0–10.5)
nRBC: 0.2 % (ref 0.0–0.2)

## 2023-01-24 LAB — PROTIME-INR
INR: 1.8 — ABNORMAL HIGH (ref 0.8–1.2)
Prothrombin Time: 20.7 seconds — ABNORMAL HIGH (ref 11.4–15.2)

## 2023-01-24 LAB — RESP PANEL BY RT-PCR (RSV, FLU A&B, COVID)  RVPGX2
Influenza A by PCR: NEGATIVE
Influenza B by PCR: NEGATIVE
Resp Syncytial Virus by PCR: NEGATIVE
SARS Coronavirus 2 by RT PCR: NEGATIVE

## 2023-01-24 LAB — COMPREHENSIVE METABOLIC PANEL
ALT: 23 U/L (ref 0–44)
AST: 31 U/L (ref 15–41)
Albumin: 3 g/dL — ABNORMAL LOW (ref 3.5–5.0)
Alkaline Phosphatase: 130 U/L — ABNORMAL HIGH (ref 38–126)
Anion gap: 9 (ref 5–15)
BUN: 11 mg/dL (ref 8–23)
CO2: 25 mmol/L (ref 22–32)
Calcium: 8.2 mg/dL — ABNORMAL LOW (ref 8.9–10.3)
Chloride: 100 mmol/L (ref 98–111)
Creatinine, Ser: 0.89 mg/dL (ref 0.44–1.00)
GFR, Estimated: >60 mL/min (ref >60)
Glucose, Bld: 189 mg/dL — ABNORMAL HIGH (ref 70–99)
Potassium: 3.6 mmol/L (ref 3.5–5.1)
Sodium: 134 mmol/L — ABNORMAL LOW (ref 135–145)
Total Bilirubin: 0.9 mg/dL (ref 0.3–1.2)
Total Protein: 6.9 g/dL (ref 6.5–8.1)

## 2023-01-24 LAB — BLOOD GAS, VENOUS
Acid-Base Excess: 2.7 mmol/L — ABNORMAL HIGH (ref 0.0–2.0)
Bicarbonate: 27.9 mmol/L (ref 20.0–28.0)
O2 Saturation: 46.7 %
Patient temperature: 37
pCO2, Ven: 44 mmHg (ref 44–60)
pH, Ven: 7.41 (ref 7.25–7.43)
pO2, Ven: <31 mmHg — CL (ref 32–45)

## 2023-01-24 LAB — GLUCOSE, CAPILLARY
Glucose-Capillary: 120 mg/dL — ABNORMAL HIGH (ref 70–99)
Glucose-Capillary: 189 mg/dL — ABNORMAL HIGH (ref 70–99)

## 2023-01-24 LAB — APTT: aPTT: 33 seconds (ref 24–36)

## 2023-01-24 LAB — IRON AND TIBC
Iron: 87 ug/dL (ref 28–170)
Saturation Ratios: 22 % (ref 10.4–31.8)
TIBC: 395 ug/dL (ref 250–450)
UIBC: 308 ug/dL

## 2023-01-24 LAB — PROCALCITONIN: Procalcitonin: <0.1 ng/mL

## 2023-01-24 LAB — LACTIC ACID, PLASMA
Lactic Acid, Venous: 1.6 mmol/L (ref 0.5–1.9)
Lactic Acid, Venous: 1.4 mmol/L (ref 0.5–1.9)

## 2023-01-24 LAB — TROPONIN I (HIGH SENSITIVITY)
Troponin I (High Sensitivity): 39 ng/L — ABNORMAL HIGH (ref <18)
Troponin I (High Sensitivity): 38 ng/L — ABNORMAL HIGH (ref <18)

## 2023-01-24 LAB — BRAIN NATRIURETIC PEPTIDE: B Natriuretic Peptide: 64.5 pg/mL (ref 0.0–100.0)

## 2023-01-24 LAB — LIPASE, BLOOD: Lipase: 26 U/L (ref 11–51)

## 2023-01-24 MED ORDER — LOSARTAN POTASSIUM-HCTZ 100-25 MG PO TABS: 1.0000 | ORAL_TABLET | Freq: Every day | ORAL | Status: DC

## 2023-01-24 MED ORDER — OXYCODONE HCL 5 MG PO TABS
5.0000 mg | ORAL_TABLET | ORAL | Status: DC | PRN
  Administered 2023-01-24: 5 mg via ORAL
  Filled 2023-01-24: qty 1

## 2023-01-24 MED ORDER — LOSARTAN POTASSIUM 50 MG PO TABS
50.0000 mg | ORAL_TABLET | Freq: Every day | ORAL | Status: DC
  Administered 2023-01-24: 50 mg via ORAL
  Filled 2023-01-24: qty 1

## 2023-01-24 MED ORDER — LOSARTAN POTASSIUM 50 MG PO TABS
100.0000 mg | ORAL_TABLET | Freq: Every day | ORAL | Status: DC
  Administered 2023-01-25 – 2023-02-04 (×11): 100 mg via ORAL
  Filled 2023-01-24 (×11): qty 2

## 2023-01-24 MED ORDER — BENZONATATE 100 MG PO CAPS
200.0000 mg | ORAL_CAPSULE | Freq: Three times a day (TID) | ORAL | Status: DC
  Administered 2023-01-24 – 2023-02-04 (×33): 200 mg via ORAL
  Filled 2023-01-24 (×34): qty 2

## 2023-01-24 MED ORDER — SODIUM CHLORIDE 0.9 % IV BOLUS (SEPSIS)
1000.0000 mL | Freq: Once | INTRAVENOUS | Status: AC
  Administered 2023-01-24: 1000 mL via INTRAVENOUS

## 2023-01-24 MED ORDER — VANCOMYCIN HCL 1500 MG/300ML IV SOLN
1500.0000 mg | INTRAVENOUS | Status: DC
  Administered 2023-01-25: 1500 mg via INTRAVENOUS
  Filled 2023-01-24: qty 300

## 2023-01-24 MED ORDER — DOCUSATE SODIUM 100 MG PO CAPS
100.0000 mg | ORAL_CAPSULE | Freq: Every day | ORAL | Status: DC
  Administered 2023-01-24 – 2023-01-28 (×5): 100 mg via ORAL
  Filled 2023-01-24 (×5): qty 1

## 2023-01-24 MED ORDER — VANCOMYCIN HCL 1500 MG/300ML IV SOLN
1500.0000 mg | Freq: Once | INTRAVENOUS | Status: AC
  Administered 2023-01-24: 1500 mg via INTRAVENOUS
  Filled 2023-01-24: qty 300

## 2023-01-24 MED ORDER — ONDANSETRON HCL 4 MG/2ML IJ SOLN
4.0000 mg | Freq: Four times a day (QID) | INTRAMUSCULAR | Status: DC | PRN
  Administered 2023-01-25: 4 mg via INTRAVENOUS
  Filled 2023-01-24: qty 2

## 2023-01-24 MED ORDER — ONDANSETRON HCL 4 MG PO TABS: 4.0000 mg | ORAL_TABLET | Freq: Four times a day (QID) | ORAL | Status: DC | PRN

## 2023-01-24 MED ORDER — HYDROCODONE-ACETAMINOPHEN 5-325 MG PO TABS
1.0000 | ORAL_TABLET | Freq: Four times a day (QID) | ORAL | Status: DC | PRN
  Administered 2023-01-26 – 2023-01-30 (×8): 1 via ORAL
  Filled 2023-01-24 (×10): qty 1

## 2023-01-24 MED ORDER — ALPRAZOLAM 0.5 MG PO TABS
0.5000 mg | ORAL_TABLET | Freq: Two times a day (BID) | ORAL | Status: DC
  Administered 2023-01-24 – 2023-02-04 (×22): 0.5 mg via ORAL
  Filled 2023-01-24 (×22): qty 1

## 2023-01-24 MED ORDER — HYDROCHLOROTHIAZIDE 25 MG PO TABS
25.0000 mg | ORAL_TABLET | Freq: Every day | ORAL | Status: DC
  Administered 2023-01-25 – 2023-01-29 (×5): 25 mg via ORAL
  Filled 2023-01-24 (×5): qty 1

## 2023-01-24 MED ORDER — OXYMETAZOLINE HCL 0.05 % NA SOLN: 2.0000 | Freq: Two times a day (BID) | NASAL | Status: AC | PRN

## 2023-01-24 MED ORDER — ALBUTEROL SULFATE (2.5 MG/3ML) 0.083% IN NEBU
2.5000 mg | INHALATION_SOLUTION | Freq: Four times a day (QID) | RESPIRATORY_TRACT | Status: DC | PRN
  Filled 2023-01-24: qty 3

## 2023-01-24 MED ORDER — ACETAMINOPHEN 325 MG PO TABS: 650.0000 mg | ORAL_TABLET | Freq: Four times a day (QID) | ORAL | Status: DC | PRN

## 2023-01-24 MED ORDER — PANTOPRAZOLE SODIUM 40 MG PO TBEC
40.0000 mg | DELAYED_RELEASE_TABLET | Freq: Every day | ORAL | Status: DC
  Administered 2023-01-25 – 2023-02-04 (×11): 40 mg via ORAL
  Filled 2023-01-24 (×11): qty 1

## 2023-01-24 MED ORDER — SODIUM CHLORIDE 0.9 % IV SOLN: INTRAVENOUS | Status: DC

## 2023-01-24 MED ORDER — INSULIN ASPART 100 UNIT/ML IJ SOLN
0.0000 [IU] | Freq: Three times a day (TID) | INTRAMUSCULAR | Status: DC
  Administered 2023-01-25 – 2023-01-26 (×5): 2 [IU] via SUBCUTANEOUS
  Administered 2023-01-26: 5 [IU] via SUBCUTANEOUS
  Administered 2023-01-27: 3 [IU] via SUBCUTANEOUS
  Administered 2023-01-27: 5 [IU] via SUBCUTANEOUS
  Administered 2023-01-28: 2 [IU] via SUBCUTANEOUS
  Administered 2023-01-28: 3 [IU] via SUBCUTANEOUS
  Administered 2023-01-28: 2 [IU] via SUBCUTANEOUS
  Administered 2023-01-29 (×2): 3 [IU] via SUBCUTANEOUS
  Administered 2023-01-29: 2 [IU] via SUBCUTANEOUS
  Administered 2023-01-30: 3 [IU] via SUBCUTANEOUS
  Administered 2023-01-30: 5 [IU] via SUBCUTANEOUS
  Administered 2023-01-31: 3 [IU] via SUBCUTANEOUS
  Administered 2023-01-31: 8 [IU] via SUBCUTANEOUS
  Administered 2023-01-31 – 2023-02-01 (×2): 3 [IU] via SUBCUTANEOUS
  Administered 2023-02-01: 2 [IU] via SUBCUTANEOUS
  Administered 2023-02-01: 5 [IU] via SUBCUTANEOUS
  Administered 2023-02-02: 3 [IU] via SUBCUTANEOUS
  Administered 2023-02-02: 8 [IU] via SUBCUTANEOUS
  Administered 2023-02-03 (×2): 3 [IU] via SUBCUTANEOUS
  Administered 2023-02-04: 5 [IU] via SUBCUTANEOUS

## 2023-01-24 MED ORDER — MORPHINE SULFATE (PF) 2 MG/ML IV SOLN
2.0000 mg | INTRAVENOUS | Status: DC | PRN
  Administered 2023-01-24: 2 mg via INTRAVENOUS
  Filled 2023-01-24: qty 1

## 2023-01-24 MED ORDER — FERROUS SULFATE 325 (65 FE) MG PO TABS
325.0000 mg | ORAL_TABLET | Freq: Every day | ORAL | Status: DC
  Administered 2023-01-24 – 2023-02-04 (×12): 325 mg via ORAL
  Filled 2023-01-24 (×12): qty 1

## 2023-01-24 MED ORDER — GUAIFENESIN ER 600 MG PO TB12
600.0000 mg | ORAL_TABLET | Freq: Two times a day (BID) | ORAL | Status: DC
  Administered 2023-01-24 – 2023-01-31 (×15): 600 mg via ORAL
  Filled 2023-01-24 (×15): qty 1

## 2023-01-24 MED ORDER — SODIUM CHLORIDE 0.9 % IV SOLN
2.0000 g | Freq: Two times a day (BID) | INTRAVENOUS | Status: DC
  Administered 2023-01-24 – 2023-01-25 (×2): 2 g via INTRAVENOUS
  Filled 2023-01-24 (×2): qty 12.5

## 2023-01-24 MED ORDER — ONDANSETRON HCL 4 MG/2ML IJ SOLN
4.0000 mg | Freq: Once | INTRAMUSCULAR | Status: AC
  Administered 2023-01-24: 4 mg via INTRAVENOUS
  Filled 2023-01-24: qty 2

## 2023-01-24 MED ORDER — SODIUM CHLORIDE 0.9 % IV SOLN
2.0000 g | Freq: Once | INTRAVENOUS | Status: AC
  Administered 2023-01-24: 2 g via INTRAVENOUS
  Filled 2023-01-24: qty 12.5

## 2023-01-24 MED ORDER — HYDROMORPHONE HCL 1 MG/ML IJ SOLN
1.0000 mg | Freq: Once | INTRAMUSCULAR | Status: AC
  Administered 2023-01-24: 1 mg via INTRAVENOUS
  Filled 2023-01-24: qty 1

## 2023-01-24 MED ORDER — HYDRALAZINE HCL 20 MG/ML IJ SOLN: 10.0000 mg | Freq: Three times a day (TID) | INTRAMUSCULAR | Status: DC | PRN

## 2023-01-24 MED ORDER — ALBUTEROL SULFATE (2.5 MG/3ML) 0.083% IN NEBU: 2.5000 mg | INHALATION_SOLUTION | RESPIRATORY_TRACT | Status: DC | PRN

## 2023-01-24 MED ORDER — INSULIN ASPART 100 UNIT/ML IJ SOLN
0.0000 [IU] | Freq: Every day | INTRAMUSCULAR | Status: DC
  Administered 2023-02-03: 2 [IU] via SUBCUTANEOUS

## 2023-01-24 MED ORDER — MORPHINE SULFATE (PF) 4 MG/ML IV SOLN
4.0000 mg | INTRAVENOUS | Status: DC | PRN
  Administered 2023-01-25 – 2023-02-01 (×27): 4 mg via INTRAVENOUS
  Filled 2023-01-24 (×28): qty 1

## 2023-01-24 MED ORDER — ACETAMINOPHEN 650 MG RE SUPP: 650.0000 mg | Freq: Four times a day (QID) | RECTAL | Status: DC | PRN

## 2023-01-24 MED ORDER — APIXABAN 5 MG PO TABS
5.0000 mg | ORAL_TABLET | Freq: Two times a day (BID) | ORAL | Status: DC
  Administered 2023-01-24 – 2023-02-04 (×22): 5 mg via ORAL
  Filled 2023-01-24 (×22): qty 1

## 2023-01-24 NOTE — H&P (Signed)
History and Physical    Patient: Victoria [name] SWH:[id number] DOB: [birth year] DOA: 01/24/2023 DOS: the patient was seen and examined on 01/24/2023 PCP: Elby Showers, MD  Patient coming from: Home  Chief Complaint:  Chief Complaint  Patient presents with   Shortness of Breath   HPI: Victoria [name] is a 82 y.o. female with medical history significant of PE/DVT, DM2, pancreatic CA, HTN. Presenting with weakness and shortness of breath. She reports that she had been doing well since her recent discharge from the hospital. However, 2 days ago, she noticed that she was having productive cough and weakness. She didn't have any fever, N/V/D, sick contacts. She took some OTC meds and prescribed cough meds. However, they didn't help. This morning, her weakness worsened so she decided to come to the ED for evaluation. She denies any other aggravating or alleviating factors.   Review of Systems: As mentioned in the history of present illness. All other systems reviewed and are negative. Past Medical History:  Diagnosis Date   Abnormal finding on Pap smear    Allergy    SEASONAL   Anxiety    Breast cancer ([place]) 2000   right breast lumptectomy, radiation done   Cirrhosis ([place]) 11/2012   resolved per pt   Diabetes mellitus type 2, controlled ([place])    Hepatitis C    took tx for    Hypertension    Obesity    Thrombocytopenia ([place]) 11/2012   Past Surgical History:  Procedure Laterality Date   ANTERIOR AND POSTERIOR REPAIR N/A 02/28/2021   Procedure: ANTERIOR (CYSTOCELE) AND POSTERIOR REPAIR (RECTOCELE);  Surgeon: Cheri Fowler, MD;  Location: [facility];  Service: Gynecology;  Laterality: N/A;   BIOPSY  01/09/2023   Procedure: BIOPSY;  Surgeon: Irving Copas., MD;  Location: [facility];  Service: Gastroenterology;;   BREAST BIOPSY Left 09/09/2016   BREAST LUMPECTOMY  right   BREAST LUMPECTOMY     BREAST REDUCTION SURGERY  2005   CATARACT EXTRACTION,  BILATERAL  2014   CHOLECYSTECTOMY N/A 03/30/2013   Procedure: LAPAROSCOPIC CHOLECYSTECTOMY WITH INTRAOPERATIVE CHOLANGIOGRAM;  Surgeon: Shann Medal, MD;  Location: [facility];  Service: General;  Laterality: N/A;   COLONOSCOPY  2021   CYSTOSCOPY N/A 02/28/2021   Procedure: Consuela Mimes;  Surgeon: Cheri Fowler, MD;  Location: [place];  Service: Gynecology;  Laterality: N/A;   CYSTOSCOPY W/ RETROGRADES Left 02/28/2021   Procedure: CYSTOSCOPY WITH RETROGRADE PYELOGRAM;  Surgeon: Cheri Fowler, MD;  Location: [place];  Service: Gynecology;  Laterality: Left;   ESOPHAGOGASTRODUODENOSCOPY (EGD) WITH PROPOFOL N/A 11/25/2022   Procedure: ESOPHAGOGASTRODUODENOSCOPY (EGD) WITH PROPOFOL;  Surgeon: Carol Ada, MD;  Location: [place];  Service: Gastroenterology;  Laterality: N/A;   ESOPHAGOGASTRODUODENOSCOPY (EGD) WITH PROPOFOL N/A 01/09/2023   Procedure: ESOPHAGOGASTRODUODENOSCOPY (EGD) WITH PROPOFOL;  Surgeon: Rush Landmark Telford Nab., MD;  Location: [facility];  Service: Gastroenterology;  Laterality: N/A;   EUS  01/28/2013   Procedure: UPPER ENDOSCOPIC ULTRASOUND (EUS) LINEAR;  Surgeon: Milus Banister, MD;  Location: [facility];  Service: Endoscopy;  Laterality: N/A;  Victoria [name] [phone]   EUS Left 11/25/2022   Procedure: UPPER ENDOSCOPIC ULTRASOUND (EUS) LINEAR;  Surgeon: Carol Ada, MD;  Location: [place];  Service: Gastroenterology;  Laterality: Left;   EUS N/A 01/09/2023   Procedure: UPPER ENDOSCOPIC ULTRASOUND (EUS) LINEAR;  Surgeon: Irving Copas., MD;  [facility];  Service: Gastroenterology;  Laterality: N/A;   EXCISION OF SKIN TAG Right 03/30/2013  Procedure: EXCISION OF SKIN TAG;  Surgeon: Shann Medal, MD;  Location: [facility];  Service: General;  Laterality: Right;   FINE NEEDLE ASPIRATION  11/25/2022   Procedure: FINE NEEDLE ASPIRATION (FNA) LINEAR;  Surgeon: Carol Ada, MD;  Location: [place];   Service: Gastroenterology;;   FINE NEEDLE ASPIRATION N/A 01/09/2023   Procedure: FINE NEEDLE ASPIRATION (FNA) LINEAR;  Surgeon: Irving Copas., MD;  Location: [facility];  Service: Gastroenterology;  Laterality: N/A;   LIVER BIOPSY  03/30/2013   Procedure: LIVER BIOPSY;  Surgeon: Shann Medal, MD;  Location: [facility];  Service: General;;   POLYPECTOMY     REDUCTION MAMMAPLASTY     THYROIDECTOMY, PARTIAL  1972   left   VAGINAL HYSTERECTOMY N/A 02/28/2021   Procedure: HYSTERECTOMY VAGINAL WITH UNILATERAL SALPINGECTOMY;  Surgeon: Cheri Fowler, MD;  Location: [place];  Service: Gynecology;  Laterality: N/A;   Social History:  reports that she has never smoked. She has never used smokeless tobacco. She reports that she does not drink alcohol and does not use drugs.  Allergies  Allergen Reactions   Lisinopril Cough    Family History  Problem Relation Age of Onset   Diabetes Mother    Stroke Mother    Colon cancer Sister 73   Colon polyps Neg Hx    Esophageal cancer Neg Hx    Rectal cancer Neg Hx    Stomach cancer Neg Hx    Pancreatic cancer Neg Hx    Prostate cancer Neg Hx    Breast cancer Neg Hx     Prior to Admission medications   Medication Sig Start Date End Date Taking? Authorizing Provider  albuterol (VENTOLIN HFA) 108 (90 Base) MCG/ACT inhaler Inhale 2 puffs into the lungs every 6 (six) hours as needed for wheezing or shortness of breath. 08/15/22   Baxley, Cresenciano Lick, MD  ALPRAZolam (XANAX) 0.5 MG tablet TAKE 1 TABLET(0.5 MG) BY MOUTH TWICE DAILY AS NEEDED FOR ANXIETY Patient taking differently: Take 0.5 mg by mouth 2 (two) times daily. 08/12/22   Elby Showers, MD  apixaban (ELIQUIS) 5 MG TABS tablet Take 1 tablet (5 mg total) by mouth 2 (two) times daily. 01/11/23   Mansouraty, Telford Nab., MD  B Complex-C (B-COMPLEX WITH VITAMIN C) tablet Take 1 tablet by mouth daily. 01/20/23   Lavina Hamman, MD  benzonatate (TESSALON PERLES) 100 MG capsule  Take 1 capsule (100 mg total) by mouth 3 (three) times daily as needed for cough. 01/20/23 01/20/24  Lavina Hamman, MD  Blood Glucose Monitoring Suppl (TRUE METRIX METER) w/Device KIT USE AS DIRECTED 07/30/21   Elby Showers, MD  cetirizine (ZYRTEC) 10 MG tablet Take 10 mg by mouth daily as needed (allergies).    [provider]  chlorpheniramine-HYDROcodone (TUSSIONEX) 10-8 MG/5ML Take 5 mLs by mouth every 12 (twelve) hours as needed for cough. 01/20/23   Lavina Hamman, MD  cholecalciferol (VITAMIN D3) 25 MCG (1000 UNIT) tablet Take 1,000 Units by mouth in the morning.    [provider]  docusate sodium (COLACE) 100 MG capsule Take 1 capsule (100 mg total) by mouth daily. Patient taking differently: Take 100 mg by mouth every 6 (six) hours as needed (with norco for constipation prevention.). 12/27/22   Dorie Rank, MD  ferrous sulfate 325 (65 FE) MG tablet Take 1 tablet (325 mg total) by mouth daily. 01/20/23 01/20/24  Lavina Hamman, MD  glucose blood (TRUE METRIX BLOOD GLUCOSE TEST) test  strip TEST BLOOD SUGAR TWICE DAILY 10/10/22   Elby Showers, MD  guaiFENesin (MUCINEX) 600 MG 12 hr tablet Take 1 tablet (600 mg total) by mouth 2 (two) times daily. 01/20/23 01/20/24  Lavina Hamman, MD  HYDROcodone-acetaminophen (NORCO/VICODIN) 5-325 MG tablet Take 1 tablet by mouth every 6 (six) hours as needed. Patient taking differently: Take 1 tablet by mouth every 6 (six) hours as needed for moderate pain or severe pain. 12/27/22   Dorie Rank, MD  Lancets (FREESTYLE) lancets CHECK GLUCOSE TWICE A DAY 01/26/20   Elby Showers, MD  loperamide (IMODIUM) 2 MG capsule Take 2-4 mg by mouth 3 (three) times daily as needed for diarrhea or loose stools.    [provider]  losartan-hydrochlorothiazide (HYZAAR) 100-25 MG tablet Take 1 tablet by mouth daily. 12/03/21   Elby Showers, MD  metFORMIN (GLUCOPHAGE) 500 MG tablet TAKE 1 TABLET(500 MG) BY MOUTH TWICE DAILY WITH A MEAL 11/20/22   Elby Showers, MD  Multiple Vitamin (MULTIVITAMIN WITH MINERALS) TABS tablet Take 1 tablet by mouth in the morning.    [provider]  oxymetazoline (AFRIN) 0.05 % nasal spray Place 2 sprays into both nostrils 2 (two) times daily as needed for congestion. 01/20/23 01/20/24  Lavina Hamman, MD  pantoprazole (PROTONIX) 40 MG tablet Take 1 tablet (40 mg total) by mouth daily. 12/25/22   Elby Showers, MD    Physical Exam: Vitals:   01/24/23 1024  BP: (!) 151/103  Pulse: (!) 109  Resp: 19  Temp: 98.5 F (36.9 C)  TempSrc: Oral  SpO2: 98%   General: 82 y.o. female resting in bed in NAD Eyes: PERRL, normal sclera ENMT: Nares patent w/o discharge, orophaynx clear, dentition normal, ears w/o discharge/lesions/ulcers Neck: Supple, trachea midline Cardiovascular: RRR, +S1, S2, no m/g/r, equal pulses throughout Respiratory: CTABL, no w/r/r, normal WOB GI: BS+, NDNT, no masses noted, no organomegaly noted MSK: No e/c/c Neuro: A&O x 3, no focal deficits Psyc: Appropriate interaction and affect, calm/cooperative  Data Reviewed:  Results for orders placed or performed during the hospital encounter of 01/24/23 (from the past 24 hour(s))  Resp panel by RT-PCR (RSV, Flu A&B, Covid) Anterior Nasal Swab     Status: None   Collection Time: 01/24/23 10:15 AM   Specimen: Anterior Nasal Swab  Result Value Ref Range   SARS Coronavirus 2 by RT PCR NEGATIVE NEGATIVE   Influenza A by PCR NEGATIVE NEGATIVE   Influenza B by PCR NEGATIVE NEGATIVE   Resp Syncytial Virus by PCR NEGATIVE NEGATIVE  Comprehensive metabolic panel     Status: Abnormal   Collection Time: 01/24/23 10:45 AM  Result Value Ref Range   Sodium 134 (L) 135 - 145 mmol/L   Potassium 3.6 3.5 - 5.1 mmol/L   Chloride 100 98 - 111 mmol/L   CO2 25 22 - 32 mmol/L   Glucose, Bld 189 (H) 70 - 99 mg/dL   BUN 11 8 - 23 mg/dL   Creatinine, Ser 0.89 0.44 - 1.00 mg/dL   Calcium 8.2 (L) 8.9 - 10.3 mg/dL   Total Protein 6.9 6.5 - 8.1 g/dL    Albumin 3.0 (L) 3.5 - 5.0 g/dL   AST 31 15 - 41 U/L   ALT 23 0 - 44 U/L   Alkaline Phosphatase 130 (H) 38 - 126 U/L   Total Bilirubin 0.9 0.3 - 1.2 mg/dL   GFR, Estimated >60 >60 mL/min   Anion gap 9 5 - 15  CBC with Differential     Status: Abnormal   Collection Time: 01/24/23 10:45 AM  Result Value Ref Range   WBC 8.3 4.0 - 10.5 K/uL   RBC 2.88 (L) 3.87 - 5.11 MIL/uL   Hemoglobin 8.0 (L) 12.0 - 15.0 g/dL   HCT 25.4 (L) 36.0 - 46.0 %   MCV 88.2 80.0 - 100.0 fL   MCH 27.8 26.0 - 34.0 pg   MCHC 31.5 30.0 - 36.0 g/dL   RDW 18.5 (H) 11.5 - 15.5 %   Platelets 210 150 - 400 K/uL   nRBC 0.2 0.0 - 0.2 %   Neutrophils Relative % 77 %   Neutro Abs 6.4 1.7 - 7.7 K/uL   Lymphocytes Relative 14 %   Lymphs Abs 1.1 0.7 - 4.0 K/uL   Monocytes Relative 8 %   Monocytes Absolute 0.6 0.1 - 1.0 K/uL   Eosinophils Relative 1 %   Eosinophils Absolute 0.1 0.0 - 0.5 K/uL   Basophils Relative 0 %   Basophils Absolute 0.0 0.0 - 0.1 K/uL   Immature Granulocytes 0 %   Abs Immature Granulocytes 0.03 0.00 - 0.07 K/uL  Troponin I (High Sensitivity)     Status: Abnormal   Collection Time: 01/24/23 10:45 AM  Result Value Ref Range   Troponin I (High Sensitivity) 39 (H) <18 ng/L  Brain natriuretic peptide     Status: None   Collection Time: 01/24/23 10:45 AM  Result Value Ref Range   B Natriuretic Peptide 64.5 0.0 - 100.0 pg/mL  Blood gas, venous     Status: Abnormal   Collection Time: 01/24/23 10:45 AM  Result Value Ref Range   pH, Ven 7.41 7.25 - 7.43   pCO2, Ven 44 44 - 60 mmHg   pO2, Ven <31 (LL) 32 - 45 mmHg   Bicarbonate 27.9 20.0 - 28.0 mmol/L   Acid-Base Excess 2.7 (H) 0.0 - 2.0 mmol/L   O2 Saturation 46.7 %   Patient temperature 37.0   Protime-INR     Status: Abnormal   Collection Time: 01/24/23 10:45 AM  Result Value Ref Range   Prothrombin Time 20.7 (H) 11.4 - 15.2 seconds   INR 1.8 (H) 0.8 - 1.2  APTT     Status: None   Collection Time: 01/24/23 10:45 AM  Result Value Ref Range    aPTT 33 24 - 36 seconds  Lipase, blood     Status: None   Collection Time: 01/24/23 10:45 AM  Result Value Ref Range   Lipase 26 11 - 51 U/L  Lactic acid, plasma     Status: None   Collection Time: 01/24/23 11:20 AM  Result Value Ref Range   Lactic Acid, Venous 1.6 0.5 - 1.9 mmol/L    CXR: 1. Patchy bibasilar airspace opacities, multifocal pneumonia versus asymmetric pulmonary edema. 2. Bilateral interstitial prominence, also compatible with atypical pneumonia versus edema.  EKG: sinus, no st elevations  Assessment and Plan: Multifocal PNA     - place in obs, tele     - continue vanc, cefepime for now     - check procal, RVP     - PRN nebs, guaifenesin  HTN     - continue home regimen when confirmed  Hx of DVT Hx of PE     - continue home regimen when confirmed  Hx of pancreatic CA     - continue outpt follow up  DM2     - SSI, DM diet, glucose checks  Normocytic anemia     -  during her last admission, he was ranging 7.3 to 8.5 (7.3 at discharge); she's 8.0 today; stable, no evidence of bleed     - check iron panel  Advance Care Planning:   Code Status: FULL  Consults: None  Family Communication: None at bedside  Severity of Illness: The appropriate patient status for this patient is OBSERVATION. Observation status is judged to be reasonable and necessary in order to provide the required intensity of service to ensure the patient's safety. The patient's presenting symptoms, physical exam findings, and initial radiographic and laboratory data in the context of their medical condition is felt to place them at decreased risk for further clinical deterioration. Furthermore, it is anticipated that the patient will be medically stable for discharge from the hospital within 2 midnights of admission.   Author: Jonnie Finner, DO 01/24/2023 12:32 PM  For on call review [email or web address].

## 2023-01-24 NOTE — Progress Notes (Signed)
I called miss Amstutz to review port insertion appt.  She was in distress stating she was having chest tightness and dyspnea.  I instructed her to call 911 immediately.  She verbalized understanding.

## 2023-01-24 NOTE — ED Provider Notes (Signed)
[facility] AT [facility] Provider Note   CSN: [id number] Arrival date & time: 01/24/23  1010     History  Chief Complaint  Patient presents with   Shortness of Breath    Victoria [name] is a 82 y.o. female.  With PMH of newly diagnosed pancreatic cancer currently not on chemo or radiation, history of PE and left upper extremity DVT currently on Eliquis, HTN, DM2 presenting with generalized fatigue and weakness with persistent cough associated with shortness of breath.  According to EMS, when fire arrived she was satting 80s on room air put on nonrebreather however she remained 98% on 2 L nasal cannula with EMS.  She was also noted to be tachycardic to 120.  Patient says she has been feeling unwell since being discharged from the hospital recently.  She does not feel any better.  She has had a persistent cough that has been productive of clear and white frothy sputum.  No hemoptysis.  She has had associated dyspnea on exertion, no orthopnea or PND.  No weight gain endorsing more weight loss.  She has had persistent ongoing upper epigastrium pain with nausea but no vomiting no diarrhea no melena or hematochezia.  She has been compliant with her Eliquis.  She notes she was on a course of antibiotics while in the hospital but did not continue any antibiotics going home.  She has had no associated fevers, congestion, rhinorrhea, sore throat.  No history of COPD or asthma.  Complaining of generalized fatigue and weakness and feeling worse upon standing up or ambulating very lightheaded but no syncopal episodes. Denying CP.   Shortness of Breath      Home Medications Prior to Admission medications   Medication Sig Start Date End Date Taking? Authorizing Provider  albuterol (VENTOLIN HFA) 108 (90 Base) MCG/ACT inhaler Inhale 2 puffs into the lungs every 6 (six) hours as needed for wheezing or shortness of breath. 08/15/22  Yes Baxley, Cresenciano Lick, MD  ALPRAZolam  (XANAX) 0.5 MG tablet TAKE 1 TABLET(0.5 MG) BY MOUTH TWICE DAILY AS NEEDED FOR ANXIETY Patient taking differently: Take 0.5 mg by mouth See admin instructions. Take 0.5 mg by mouth in the morning and evening 08/12/22  Yes Baxley, Cresenciano Lick, MD  apixaban (ELIQUIS) 5 MG TABS tablet Take 1 tablet (5 mg total) by mouth 2 (two) times daily. 01/11/23  Yes Mansouraty, Telford Nab., MD  B Complex-C (B-COMPLEX WITH VITAMIN C) tablet Take 1 tablet by mouth daily. 01/20/23  Yes Lavina Hamman, MD  benzonatate (TESSALON PERLES) 100 MG capsule Take 1 capsule (100 mg total) by mouth 3 (three) times daily as needed for cough. 01/20/23 01/20/24 Yes Lavina Hamman, MD  cetirizine (ZYRTEC) 10 MG tablet Take 10 mg by mouth daily as needed (allergies).   Yes [provider]  chlorpheniramine-HYDROcodone (TUSSIONEX) 10-8 MG/5ML Take 5 mLs by mouth every 12 (twelve) hours as needed for cough. 01/20/23  Yes Lavina Hamman, MD  cholecalciferol (VITAMIN D3) 25 MCG (1000 UNIT) tablet Take 1,000 Units by mouth in the morning.   Yes [provider]  docusate sodium (COLACE) 100 MG capsule Take 1 capsule (100 mg total) by mouth daily. Patient taking differently: Take 100 mg by mouth every 6 (six) hours as needed (with norco for constipation prevention.). 12/27/22  Yes Dorie Rank, MD  ferrous sulfate 325 (65 FE) MG tablet Take 1 tablet (325 mg total) by mouth daily. 01/20/23 01/20/24 Yes Lavina Hamman, MD  HYDROcodone-acetaminophen (NORCO/VICODIN) 5-325 MG tablet Take 1 tablet by mouth every 6 (six) hours as needed. Patient taking differently: Take 1 tablet by mouth every 6 (six) hours as needed for moderate pain or severe pain. 12/27/22  Yes Dorie Rank, MD  loperamide (IMODIUM) 2 MG capsule Take 2-4 mg by mouth 3 (three) times daily as needed for diarrhea or loose stools.   Yes [provider]  losartan-hydrochlorothiazide (HYZAAR) 100-25 MG tablet Take 1 tablet by mouth daily. 12/03/21  Yes Baxley, Cresenciano Lick, MD   metFORMIN (GLUCOPHAGE) 500 MG tablet TAKE 1 TABLET(500 MG) BY MOUTH TWICE DAILY WITH A MEAL Patient taking differently: Take 500 mg by mouth 2 (two) times daily with a meal. 11/20/22  Yes Baxley, Cresenciano Lick, MD  Multiple Vitamin (MULTIVITAMIN WITH MINERALS) TABS tablet Take 1 tablet by mouth in the morning.   Yes [provider]  oxymetazoline (AFRIN) 0.05 % nasal spray Place 2 sprays into both nostrils 2 (two) times daily as needed for congestion. 01/20/23 01/20/24 Yes Lavina Hamman, MD  pantoprazole (PROTONIX) 40 MG tablet Take 1 tablet (40 mg total) by mouth daily. Patient taking differently: Take 40 mg by mouth daily before breakfast. 12/25/22  Yes Baxley, Cresenciano Lick, MD  Blood Glucose Monitoring Suppl (TRUE METRIX METER) w/Device KIT USE AS DIRECTED 07/30/21   Elby Showers, MD  glucose blood (TRUE METRIX BLOOD GLUCOSE TEST) test strip TEST BLOOD SUGAR TWICE DAILY 10/10/22   Elby Showers, MD  guaiFENesin (MUCINEX) 600 MG 12 hr tablet Take 1 tablet (600 mg total) by mouth 2 (two) times daily. Patient not taking: Reported on 01/24/2023 01/20/23 01/20/24  Lavina Hamman, MD  Lancets (FREESTYLE) lancets CHECK GLUCOSE TWICE A DAY 01/26/20   Elby Showers, MD      Allergies    Lisinopril    Review of Systems   Review of Systems  Respiratory:  Positive for shortness of breath.     Physical Exam Updated Vital Signs BP (!) 151/88 (BP Location: Left Arm)   Pulse (!) 116   Temp 97.8 F (36.6 C) (Oral)   Resp 20   Ht '5\' 8"'$  (1.727 m)   Wt 88.9 kg   SpO2 91%   BMI 29.80 kg/m  Physical Exam Constitutional: Alert and oriented.  Fatigued and uncomfortable in appearance, actively dry coughing Eyes: Conjunctivae are normal. ENT      Head: Normocephalic and atraumatic.      Nose: No congestion.      Mouth/Throat: Mucous membranes are moist.      Neck: No stridor. Cardiovascular: S1, S2, tachycardic, regular rhythm, warm well-perfused Respiratory: Normal respiratory effort. Breath sounds with  mild faint rhonchi.  O2 sat 98 on RA Gastrointestinal: Soft and nondistended with epigastrium and left upper quadrant tenderness no rebound or guarding Musculoskeletal: Normal range of motion in all extremities. LUE nonpitting edema      Right lower leg: No tenderness or edema.      Left lower leg: No tenderness or edema. Neurologic: Normal speech and language.  No facial droop.  Moving all extremities equally.  Sensation grossly intact.  No gross focal neurologic deficits are appreciated. Skin: Skin is warm, dry and intact. No rash noted. Psychiatric: Mood and affect are normal. Speech and behavior are normal.  ED Results / Procedures / Treatments   Labs (all labs ordered are listed, but only abnormal results are displayed) Labs Reviewed  COMPREHENSIVE METABOLIC PANEL - Abnormal; Notable for the following components:  Result Value   Sodium 134 (*)    Glucose, Bld 189 (*)    Calcium 8.2 (*)    Albumin 3.0 (*)    Alkaline Phosphatase 130 (*)    All other components within normal limits  CBC WITH DIFFERENTIAL/PLATELET - Abnormal; Notable for the following components:   RBC 2.88 (*)    Hemoglobin 8.0 (*)    HCT 25.4 (*)    RDW 18.5 (*)    All other components within normal limits  BLOOD GAS, VENOUS - Abnormal; Notable for the following components:   pO2, Ven <31 (*)    Acid-Base Excess 2.7 (*)    All other components within normal limits  PROTIME-INR - Abnormal; Notable for the following components:   Prothrombin Time 20.7 (*)    INR 1.8 (*)    All other components within normal limits  GLUCOSE, CAPILLARY - Abnormal; Notable for the following components:   Glucose-Capillary 120 (*)    All other components within normal limits  TROPONIN I (HIGH SENSITIVITY) - Abnormal; Notable for the following components:   Troponin I (High Sensitivity) 39 (*)    All other components within normal limits  TROPONIN I (HIGH SENSITIVITY) - Abnormal; Notable for the following components:    Troponin I (High Sensitivity) 38 (*)    All other components within normal limits  RESP PANEL BY RT-PCR (RSV, FLU A&B, COVID)  RVPGX2  RESPIRATORY PANEL BY PCR  CULTURE, BLOOD (ROUTINE X 2)  CULTURE, BLOOD (ROUTINE X 2)  BRAIN NATRIURETIC PEPTIDE  APTT  LIPASE, BLOOD  LACTIC ACID, PLASMA  LACTIC ACID, PLASMA  PROCALCITONIN  LEGIONELLA PNEUMOPHILA SEROGP 1 UR AG  STREP PNEUMONIAE URINARY ANTIGEN  IRON AND TIBC  COMPREHENSIVE METABOLIC PANEL  CBC    EKG EKG Interpretation  Date/Time:  Friday January 24 2023 10:55:17 EST Ventricular Rate:  93 PR Interval:  155 QRS Duration: 89 QT Interval:  362 QTC Calculation: 451 R Axis:   -26 Text Interpretation: Sinus rhythm Borderline left axis deviation Low voltage, precordial leads Confirmed by Georgina Snell [phone]) on 01/24/2023 11:24:26 AM  Radiology DG Chest Portable 1 View  Result Date: 01/24/2023 CLINICAL DATA:  Shortness of breath, recent pneumonia and PE. EXAM: PORTABLE CHEST 1 VIEW COMPARISON:  Chest x-ray dated 12/06/2022. FINDINGS: Diffuse bilateral interstitial prominence, presumably edema, and patchy bibasilar airspace opacities. No pleural effusion or pneumothorax is seen. Heart size and mediastinal contours are stable. IMPRESSION: 1. Patchy bibasilar airspace opacities, multifocal pneumonia versus asymmetric pulmonary edema. 2. Bilateral interstitial prominence, also compatible with atypical pneumonia versus edema. Electronically Signed   By: Franki Cabot M.D.   On: 01/24/2023 10:40    Procedures .Critical Care  Performed by: Elgie Congo, MD Authorized by: Elgie Congo, MD   Critical care provider statement:    Critical care time (minutes):  30   Critical care was necessary to treat or prevent imminent or life-threatening deterioration of the following conditions:  Cardiac failure and sepsis   Critical care was time spent personally by me on the following activities:  Development of treatment plan with  patient or surrogate, discussions with consultants, evaluation of patient's response to treatment, examination of patient, ordering and review of laboratory studies, ordering and review of radiographic studies, ordering and performing treatments and interventions, pulse oximetry, re-evaluation of patient's condition, review of old charts and obtaining history from patient or surrogate   Care discussed with: admitting provider     Remain on constant cardiac monitoring, sinus tachycardia.  Medications Ordered in ED Medications  apixaban (ELIQUIS) tablet 5 mg (has no administration in time range)  insulin aspart (novoLOG) injection 0-15 Units ( Subcutaneous Not Given 01/24/23 1644)  insulin aspart (novoLOG) injection 0-5 Units (has no administration in time range)  guaiFENesin (MUCINEX) 12 hr tablet 600 mg (600 mg Oral Given 01/24/23 1501)  ondansetron (ZOFRAN) tablet 4 mg (has no administration in time range)    Or  ondansetron (ZOFRAN) injection 4 mg (has no administration in time range)  0.9 %  sodium chloride infusion ( Intravenous Infusion Verify 01/24/23 1513)  hydrALAZINE (APRESOLINE) injection 10 mg (has no administration in time range)  benzonatate (TESSALON) capsule 200 mg (200 mg Oral Given 01/24/23 1501)  acetaminophen (TYLENOL) tablet 650 mg (has no administration in time range)    Or  acetaminophen (TYLENOL) suppository 650 mg (has no administration in time range)  ceFEPIme (MAXIPIME) 2 g in sodium chloride 0.9 % 100 mL IVPB (has no administration in time range)  vancomycin (VANCOREADY) IVPB 1500 mg/300 mL (has no administration in time range)  HYDROcodone-acetaminophen (NORCO/VICODIN) 5-325 MG per tablet 1 tablet (has no administration in time range)  ALPRAZolam (XANAX) tablet 0.5 mg (has no administration in time range)  docusate sodium (COLACE) capsule 100 mg (100 mg Oral Given 01/24/23 1751)  pantoprazole (PROTONIX) EC tablet 40 mg (has no administration in time range)  albuterol  (PROVENTIL) (2.5 MG/3ML) 0.083% nebulizer solution 2.5 mg (has no administration in time range)  oxymetazoline (AFRIN) 0.05 % nasal spray 2 spray (has no administration in time range)  ferrous sulfate tablet 325 mg (325 mg Oral Given 01/24/23 1750)  morphine (PF) 4 MG/ML injection 4 mg (has no administration in time range)  losartan (COZAAR) tablet 100 mg (has no administration in time range)    And  hydrochlorothiazide (HYDRODIURIL) tablet 25 mg (has no administration in time range)  HYDROmorphone (DILAUDID) injection 1 mg (1 mg Intravenous Given 01/24/23 1059)  ondansetron (ZOFRAN) injection 4 mg (4 mg Intravenous Given 01/24/23 1059)  sodium chloride 0.9 % bolus 1,000 mL (0 mLs Intravenous Stopped 01/24/23 1311)  ceFEPIme (MAXIPIME) 2 g in sodium chloride 0.9 % 100 mL IVPB (0 g Intravenous Stopped 01/24/23 1132)  vancomycin (VANCOREADY) IVPB 1500 mg/300 mL (0 mg Intravenous Stopped 01/24/23 1405)    ED Course/ Medical Decision Making/ A&P Clinical Course as of 01/24/23 1932  Fri Jan 24, 2023  1130 Troponin elevated 39 suspect demand ischemia in the setting of multifocal pneumonia.  Mild hyponatremia 134.  EKG without acute changes no active chest pain.  COVID RSV and flu all negative.  VBG normal pH 7.41, pCO2 44.  BMP unremarkable 64.  Lactate 1.6 reassuring. [VB]  1237 Discussed case with Dr. Marylyn Ishihara for admission for continued management of multifocal pneumonia. [VB]    Clinical Course User Index [VB] Elgie Congo, MD                            Medical Decision Making TEIGHAN AUBERT is a 82 y.o. female.  With PMH of newly diagnosed pancreatic cancer currently not on chemo or radiation, history of PE and left upper extremity DVT currently on Eliquis, HTN, DM2 presenting with generalized fatigue and weakness with persistent cough associated with shortness of breath.  Patient presents tachycardic, hypertensive 151/103 with no increased work of breathing or hypoxia on room air although  reported hypoxia with EMS/fire department.  With history of recent pneumonia and persistent  symptoms, concern for likely symptoms secondary to persistent pneumonia.  Does not appear fluid overloaded on exam and last echocardiogram generally reassuring with grade 1 diastolic dysfunction.  No wheezing or reactive airway disease history suggestive of reactive airway disease exacerbation.  No hemoptysis, no signs of DVT on exam, has known active left upper extremity DVT but has been compliant with Eliquis not missing any doses, consider PE but less likely if chest x-ray was significant findings.  Chest x-ray reviewed by me concerning for bibasilar infiltrates concerning for multifocal pneumonia.  Considered possible edema however with the no signs of fluid overload on exam and recent echocardiogram suspect less likely.  Started patient on broad-spectrum antibiotics due to recent hospitalization.  Labs reviewed by me, normal white blood cell count 8.3 steady anemia hemoglobin 8, normal platelet count. Troponin elevated 39 suspect demand ischemia in the setting of multifocal pneumonia.  Mild hyponatremia 134.  EKG without acute changes no active chest pain.  COVID RSV and flu all negative.  VBG normal pH 7.41, pCO2 44.  Discussed case with Dr. Marylyn Ishihara for admission for continued management of multifocal pneumonia   Amount and/or Complexity of Data Reviewed Labs: ordered. Radiology: ordered.  Risk Prescription drug management. Decision regarding hospitalization.    Final Clinical Impression(s) / ED Diagnoses Final diagnoses:  Shortness of breath  Weakness  Multifocal pneumonia    Rx / DC Orders ED Discharge Orders     None         Elgie Congo, MD 01/24/23 1932

## 2023-01-24 NOTE — Progress Notes (Signed)
Pharmacy Antibiotic Note  Victoria [name] is a 82 y.o. female admitted on 01/24/2023 with pneumonia.  Pharmacy has been consulted for Vanco, Cefepime dosing.  Active Problem(s): SOB  PMH:  PE/DVT, DM2, pancreatic CA, HTN , anxiety, breast cancer, cirrhosis, obesity, thrombocytopenia  ID: PNA,  Afebrile, WBC WNL, Scr <1, Lactic acid 1.6  Vanco 2/2>> Cefepime 2/2>>  Plan: Cefepime 2g IV q12 hrs Vancomycin 1500 mg IV Q 24 hrs. Goal AUC 400-550. Expected AUC: 476 SCr used: 0.8   Weight: 83.9 kg (185 lb)  Temp (24hrs), Avg:98.5 F (36.9 C), Min:98.5 F (36.9 C), Max:98.5 F (36.9 C)  Recent Labs  Lab 01/18/23 0152 01/19/23 0248 01/20/23 0302 01/24/23 1045 01/24/23 1120 01/24/23 1326  WBC 7.1 6.8 6.7 8.3  --   --   CREATININE  --   --   --  0.89  --   --   LATICACIDVEN  --   --   --   --  1.6 1.4    Estimated Creatinine Clearance: 58.2 mL/min (by C-G formula based on SCr of 0.89 mg/dL).    Allergies  Allergen Reactions   Lisinopril Cough    Victoria [name], PharmD, BCPS Clinical Staff Pharmacist [email or web address]  Wayland Salinas 01/24/2023 2:26 PM

## 2023-01-24 NOTE — ED Triage Notes (Signed)
Pt BIBA from home for [facility] since last night. DC on 12/29 after pna. Endorses productive cough without blood. EMS unable to get bp. Left arm has been swollen since dc.  HR 120 down to 100 98% 2L [place]  100% RA in triage

## 2023-01-24 NOTE — Progress Notes (Signed)
   01/24/23 1448  Assess: MEWS Score  Temp 98.5 F (36.9 C)  BP (!) 165/78  MAP (mmHg) 103  Pulse Rate (!) 123  Resp (!) 22  SpO2 97 %  O2 Device Room Air  Assess: MEWS Score  MEWS Temp 0  MEWS Systolic 0  MEWS Pulse 2  MEWS RR 1  MEWS LOC 0  MEWS Score 3  MEWS Score Color Yellow  Assess: if the MEWS score is Yellow or Red  Were vital signs taken at a resting state? Yes  Focused Assessment No change from prior assessment  Does the patient meet 2 or more of the SIRS criteria? Yes  Does the patient have a confirmed or suspected source of infection? Yes  Provider and Rapid Response Notified? No  MEWS guidelines implemented *See Row Information* Yes  Treat  MEWS Interventions Administered prn meds/treatments  Take Vital Signs  Increase Vital Sign Frequency  Yellow: Q 2hr X 2 then Q 4hr X 2, if remains yellow, continue Q 4hrs  Escalate  MEWS: Escalate Yellow: discuss with charge nurse/RN and consider discussing with provider and RRT  Notify: Charge Nurse/RN  Name of Charge Nurse/RN Notified English as a second language teacher  Date Charge Nurse/RN Notified 01/24/23  Time Charge Nurse/RN Notified 1450  Document  Patient Outcome Not stable and remains on department  Progress note created (see row info) Yes  Assess: SIRS CRITERIA  SIRS Temperature  0  SIRS Pulse 1  SIRS Respirations  1  SIRS WBC 0  SIRS Score Sum  2

## 2023-01-24 NOTE — ED Notes (Signed)
Blood cultures drawn after antibiotic start due to orders not being received/seen in time.

## 2023-01-24 NOTE — Progress Notes (Signed)
A consult was received from an ED physician for vancomycin per pharmacy dosing (for an indication other than meningitis). The patient's profile has been reviewed for ht/wt/allergies/indication/available labs. A one time order has been placed for the above antibiotics.  Further antibiotics/pharmacy consults should be ordered by admitting physician if indicated.                       Reuel Boom, PharmD, BCPS [phone] 01/24/2023, 11:04 AM

## 2023-01-25 DIAGNOSIS — E663 Overweight: Secondary | ICD-10-CM | POA: Diagnosis present

## 2023-01-25 DIAGNOSIS — I110 Hypertensive heart disease with heart failure: Secondary | ICD-10-CM | POA: Diagnosis not present

## 2023-01-25 DIAGNOSIS — I10 Essential (primary) hypertension: Secondary | ICD-10-CM | POA: Diagnosis not present

## 2023-01-25 DIAGNOSIS — Z79899 Other long term (current) drug therapy: Secondary | ICD-10-CM | POA: Diagnosis not present

## 2023-01-25 DIAGNOSIS — C259 Malignant neoplasm of pancreas, unspecified: Secondary | ICD-10-CM | POA: Diagnosis present

## 2023-01-25 DIAGNOSIS — E8809 Other disorders of plasma-protein metabolism, not elsewhere classified: Secondary | ICD-10-CM | POA: Diagnosis not present

## 2023-01-25 DIAGNOSIS — I509 Heart failure, unspecified: Secondary | ICD-10-CM | POA: Diagnosis not present

## 2023-01-25 DIAGNOSIS — R945 Abnormal results of liver function studies: Secondary | ICD-10-CM | POA: Diagnosis not present

## 2023-01-25 DIAGNOSIS — Z9049 Acquired absence of other specified parts of digestive tract: Secondary | ICD-10-CM | POA: Diagnosis not present

## 2023-01-25 DIAGNOSIS — Z7189 Other specified counseling: Secondary | ICD-10-CM | POA: Diagnosis not present

## 2023-01-25 DIAGNOSIS — D649 Anemia, unspecified: Secondary | ICD-10-CM

## 2023-01-25 DIAGNOSIS — Z515 Encounter for palliative care: Secondary | ICD-10-CM | POA: Diagnosis not present

## 2023-01-25 DIAGNOSIS — R531 Weakness: Secondary | ICD-10-CM | POA: Diagnosis not present

## 2023-01-25 DIAGNOSIS — J302 Other seasonal allergic rhinitis: Secondary | ICD-10-CM | POA: Diagnosis present

## 2023-01-25 DIAGNOSIS — D638 Anemia in other chronic diseases classified elsewhere: Secondary | ICD-10-CM | POA: Diagnosis not present

## 2023-01-25 DIAGNOSIS — J90 Pleural effusion, not elsewhere classified: Secondary | ICD-10-CM | POA: Diagnosis not present

## 2023-01-25 DIAGNOSIS — Z86711 Personal history of pulmonary embolism: Secondary | ICD-10-CM | POA: Diagnosis not present

## 2023-01-25 DIAGNOSIS — R0602 Shortness of breath: Secondary | ICD-10-CM | POA: Diagnosis present

## 2023-01-25 DIAGNOSIS — E876 Hypokalemia: Secondary | ICD-10-CM | POA: Diagnosis not present

## 2023-01-25 DIAGNOSIS — E440 Moderate protein-calorie malnutrition: Secondary | ICD-10-CM | POA: Diagnosis present

## 2023-01-25 DIAGNOSIS — F419 Anxiety disorder, unspecified: Secondary | ICD-10-CM | POA: Diagnosis present

## 2023-01-25 DIAGNOSIS — I5031 Acute diastolic (congestive) heart failure: Secondary | ICD-10-CM | POA: Diagnosis not present

## 2023-01-25 DIAGNOSIS — E890 Postprocedural hypothyroidism: Secondary | ICD-10-CM | POA: Diagnosis present

## 2023-01-25 DIAGNOSIS — K219 Gastro-esophageal reflux disease without esophagitis: Secondary | ICD-10-CM | POA: Diagnosis present

## 2023-01-25 DIAGNOSIS — K7460 Unspecified cirrhosis of liver: Secondary | ICD-10-CM | POA: Diagnosis not present

## 2023-01-25 DIAGNOSIS — J189 Pneumonia, unspecified organism: Secondary | ICD-10-CM | POA: Diagnosis present

## 2023-01-25 DIAGNOSIS — C772 Secondary and unspecified malignant neoplasm of intra-abdominal lymph nodes: Secondary | ICD-10-CM | POA: Diagnosis present

## 2023-01-25 DIAGNOSIS — E119 Type 2 diabetes mellitus without complications: Secondary | ICD-10-CM | POA: Diagnosis present

## 2023-01-25 DIAGNOSIS — J9601 Acute respiratory failure with hypoxia: Secondary | ICD-10-CM | POA: Diagnosis present

## 2023-01-25 DIAGNOSIS — B1920 Unspecified viral hepatitis C without hepatic coma: Secondary | ICD-10-CM | POA: Diagnosis present

## 2023-01-25 DIAGNOSIS — Z86718 Personal history of other venous thrombosis and embolism: Secondary | ICD-10-CM | POA: Diagnosis not present

## 2023-01-25 DIAGNOSIS — G893 Neoplasm related pain (acute) (chronic): Secondary | ICD-10-CM | POA: Diagnosis present

## 2023-01-25 DIAGNOSIS — Z1152 Encounter for screening for COVID-19: Secondary | ICD-10-CM | POA: Diagnosis not present

## 2023-01-25 DIAGNOSIS — I5033 Acute on chronic diastolic (congestive) heart failure: Secondary | ICD-10-CM | POA: Diagnosis not present

## 2023-01-25 LAB — GLUCOSE, CAPILLARY
Glucose-Capillary: 123 mg/dL — ABNORMAL HIGH (ref 70–99)
Glucose-Capillary: 136 mg/dL — ABNORMAL HIGH (ref 70–99)
Glucose-Capillary: 126 mg/dL — ABNORMAL HIGH (ref 70–99)
Glucose-Capillary: 138 mg/dL — ABNORMAL HIGH (ref 70–99)

## 2023-01-25 LAB — COMPREHENSIVE METABOLIC PANEL
ALT: 20 U/L (ref 0–44)
AST: 26 U/L (ref 15–41)
Albumin: 2.6 g/dL — ABNORMAL LOW (ref 3.5–5.0)
Alkaline Phosphatase: 105 U/L (ref 38–126)
Anion gap: 9 (ref 5–15)
BUN: 9 mg/dL (ref 8–23)
CO2: 23 mmol/L (ref 22–32)
Calcium: 8.1 mg/dL — ABNORMAL LOW (ref 8.9–10.3)
Chloride: 105 mmol/L (ref 98–111)
Creatinine, Ser: 0.83 mg/dL (ref 0.44–1.00)
GFR, Estimated: >60 mL/min (ref >60)
Glucose, Bld: 129 mg/dL — ABNORMAL HIGH (ref 70–99)
Potassium: 3.6 mmol/L (ref 3.5–5.1)
Sodium: 137 mmol/L (ref 135–145)
Total Bilirubin: 0.6 mg/dL (ref 0.3–1.2)
Total Protein: 6.5 g/dL (ref 6.5–8.1)

## 2023-01-25 LAB — CBC
HCT: 23.4 % — ABNORMAL LOW (ref 36.0–46.0)
Hemoglobin: 7.4 g/dL — ABNORMAL LOW (ref 12.0–15.0)
MCH: 27.7 pg (ref 26.0–34.0)
MCHC: 31.6 g/dL (ref 30.0–36.0)
MCV: 87.6 fL (ref 80.0–100.0)
Platelets: 191 10*3/uL (ref 150–400)
RBC: 2.67 MIL/uL — ABNORMAL LOW (ref 3.87–5.11)
RDW: 18.6 % — ABNORMAL HIGH (ref 11.5–15.5)
WBC: 6.8 10*3/uL (ref 4.0–10.5)
nRBC: 0 % (ref 0.0–0.2)

## 2023-01-25 LAB — BLOOD GAS, ARTERIAL
Acid-Base Excess: 1.5 mmol/L (ref 0.0–2.0)
Bicarbonate: 25.9 mmol/L (ref 20.0–28.0)
Drawn by: 560031
O2 Saturation: 95.9 %
Patient temperature: 36.8
pCO2 arterial: 39 mmHg (ref 32–48)
pH, Arterial: 7.43 (ref 7.35–7.45)
pO2, Arterial: 67 mmHg — ABNORMAL LOW (ref 83–108)

## 2023-01-25 LAB — MRSA NEXT GEN BY PCR, NASAL: MRSA by PCR Next Gen: NOT DETECTED

## 2023-01-25 MED ORDER — FUROSEMIDE 10 MG/ML IJ SOLN
20.0000 mg | Freq: Two times a day (BID) | INTRAMUSCULAR | Status: AC
  Administered 2023-01-25 – 2023-01-27 (×3): 20 mg via INTRAVENOUS
  Filled 2023-01-25 (×4): qty 2

## 2023-01-25 MED ORDER — SODIUM CHLORIDE 0.9 % IV SOLN
2.0000 g | Freq: Three times a day (TID) | INTRAVENOUS | Status: DC
  Administered 2023-01-25 – 2023-01-26 (×3): 2 g via INTRAVENOUS
  Filled 2023-01-25 (×3): qty 12.5

## 2023-01-25 NOTE — Progress Notes (Signed)
PHARMACY NOTE:  ANTIMICROBIAL RENAL DOSAGE ADJUSTMENT  Current antimicrobial regimen includes a mismatch between antimicrobial dosage and estimated renal function.  As per policy approved by the Pharmacy & Therapeutics and Medical Executive Committees, the antimicrobial dosage will be adjusted accordingly.  Current antimicrobial dosage:  Cefepime 2 g IV q12h  Indication: pneumonia  Renal Function:  Estimated Creatinine Clearance: 64.1 mL/min (by C-G formula based on SCr of 0.83 mg/dL). '[]'$      On intermittent HD, scheduled: '[]'$      On CRRT    Antimicrobial dosage has been changed to:  Cefepime 2 g IV q8h  Additional comments:   Thank you for allowing pharmacy to be a part of this patient's care.  Suzzanne Cloud, [facility] 01/25/2023 12:15 PM

## 2023-01-25 NOTE — Progress Notes (Signed)
PROGRESS NOTE    Victoria [name]  IOX:[id number] DOB: [birth year] DOA: 01/24/2023 PCP: Elby Showers, MD    Chief Complaint  Patient presents with   Shortness of Breath    Brief Narrative:  HPI per Dr. Kandis Nab is a 82 y.o. female with medical history significant of PE/DVT, DM2, pancreatic CA, HTN. Presenting with weakness and shortness of breath. She reports that she had been doing well since her recent discharge from the hospital. However, 2 days ago, she noticed that she was having productive cough and weakness. She didn't have any fever, N/V/D, sick contacts. She took some OTC meds and prescribed cough meds. However, they didn't help. This morning, her weakness worsened so she decided to come to the ED for evaluation. She denies any other aggravating or alleviating factors.     Assessment & Plan:   Principal Problem:   Multifocal pneumonia Active Problems:   DM2 (diabetes mellitus, type 2) ([place])   Hypertension   Pancreatic cancer ([place])   History of DVT (deep vein thrombosis)   History of pulmonary embolism   Normocytic anemia  #1 shortness of breath/??  Multifocal pneumonia -Patient presenting with generalized weakness, shortness of breath. -Patient noted to have recently hospitalized and treated for a atypical pneumonia with 5 days of IV Rocephin and IV azithromycin. -BNP within normal limits. -Procalcitonin negative. -Check MRSA PCR -Blood cultures pending. -Respiratory viral panel negative. -SARS coronavirus PCR negative, influenza A and B PCR negative, respiratory syncytial virus negative. -Continue empiric IV antibiotics. -Trial of Lasix 20 mg IV every 12 hours x 4 doses. -Saline lock IV fluids. -Strict I's and O's, daily weights.  2.  Hypertension -Continue home regimen Cozaar, HCTZ. -Start patient on Lasix 20 mg IV every 12 hours x 4 doses.  3.  History of DVT/PE -Eliquis.  4.  Diabetes mellitus type 2 -Hemoglobin A1c 6.7 (12/06/2022) -CBG  123 this morning. -Hold oral hypoglycemic agents. -SSI.  5.  Normocytic anemia -Anemia panel consistent with anemia of chronic disease. -Hemoglobin stable at 7.4. -Follow H&H. -Transfusion threshold hemoglobin < 7.  6.  History of pancreatic cancer -Outpatient follow-up with oncology.    DVT prophylaxis: Eliquis Code Status: Full Family Communication: Updated patient.  No family at bedside. Disposition: TBD.  Status is: Inpatient Remains inpatient appropriate because: Severity of illness.   Consultants:  None  Procedures:  Chest x-ray 01/24/2023    Antimicrobials:  IV cefepime 01/24/2023>>>>> IV vancomycin 01/24/2023>>>>   Subjective: Still with complaints of shortness of breath and cough for clear frothy sputum per patient.  Objective: Vitals:   01/24/23 1643 01/24/23 1846 01/24/23 2136 01/25/23 0355  BP: (!) 171/102 (!) 151/88 (!) 143/82 (!) 148/75  Pulse: (!) 107 (!) 116 (!) 110 94  Resp: (!) '21 20 20 18  '$ Temp: 97.9 F (36.6 C) 97.8 F (36.6 C) 98.1 F (36.7 C) 98.3 F (36.8 C)  TempSrc: Oral Oral Oral Oral  SpO2: 92% 91% 93% 93%  Weight:      Height:        Intake/Output Summary (Last 24 hours) at 01/25/2023 1241 Last data filed at 01/25/2023 0400 Gross per 24 hour  Intake 2501.58 ml  Output --  Net 2501.58 ml   Filed Weights   01/24/23 1400 01/24/23 1514  Weight: 83.9 kg 88.9 kg    Examination:  General exam: NAD. Respiratory system: Some decreased breath sounds in the bases.  Poor to fair air movement.  Minimal expiratory wheezing.  Some upper  airway noise. Cardiovascular system: RRR no murmurs rubs or gallops.  No JVD.  No lower extremity edema.  Gastrointestinal system: Abdomen is nondistended, soft and nontender. No organomegaly or masses felt. Normal bowel sounds heard. Central nervous system: Alert and oriented. No focal neurological deficits. Extremities: Symmetric 5 x 5 power. Skin: No rashes, lesions or ulcers Psychiatry: Judgement and  insight appear normal. Mood & affect appropriate.     Data Reviewed: I have personally reviewed following labs and imaging studies  CBC: Recent Labs  Lab 01/19/23 0248 01/20/23 0302 01/24/23 1045 01/25/23 0559  WBC 6.8 6.7 8.3 6.8  NEUTROABS 4.4 4.4 6.4  --   HGB 7.4* 7.3* 8.0* 7.4*  HCT 23.3* 23.9* 25.4* 23.4*  MCV 87.9 92.3 88.2 87.6  PLT 115* 133* 210 914    Basic Metabolic Panel: Recent Labs  Lab 01/24/23 1045 01/25/23 0559  NA 134* 137  K 3.6 3.6  CL 100 105  CO2 25 23  GLUCOSE 189* 129*  BUN 11 9  CREATININE 0.89 0.83  CALCIUM 8.2* 8.1*    GFR: Estimated Creatinine Clearance: 64.1 mL/min (by C-G formula based on SCr of 0.83 mg/dL).  Liver Function Tests: Recent Labs  Lab 01/24/23 1045 01/25/23 0559  AST 31 26  ALT 23 20  ALKPHOS 130* 105  BILITOT 0.9 0.6  PROT 6.9 6.5  ALBUMIN 3.0* 2.6*    CBG: Recent Labs  Lab 01/20/23 1133 01/24/23 1640 01/24/23 2133 01/25/23 0756 01/25/23 1220  GLUCAP 192* 120* 189* 123* 136*     Recent Results (from the past 240 hour(s))  Resp panel by RT-PCR (RSV, Flu A&B, Covid) Anterior Nasal Swab     Status: None   Collection Time: 01/24/23 10:15 AM   Specimen: Anterior Nasal Swab  Result Value Ref Range Status   SARS Coronavirus 2 by RT PCR NEGATIVE NEGATIVE Final    Comment: (NOTE) SARS-CoV-2 target nucleic acids are NOT DETECTED.  The SARS-CoV-2 RNA is generally detectable in upper respiratory specimens during the acute phase of infection. The lowest concentration of SARS-CoV-2 viral copies this assay can detect is 138 copies/mL. A negative result does not preclude SARS-Cov-2 infection and should not be used as the sole basis for treatment or other patient management decisions. A negative result may occur with  improper specimen collection/handling, submission of specimen other than nasopharyngeal swab, presence of viral mutation(s) within the areas targeted by this assay, and inadequate number of  viral copies(<138 copies/mL). A negative result must be combined with clinical observations, patient history, and epidemiological information. The expected result is Negative.  Fact Sheet for Patients:  EntrepreneurPulse.com.au  Fact Sheet for Healthcare Providers:  IncredibleEmployment.be  This test is no t yet approved or cleared by the [place] FDA and  has been authorized for detection and/or diagnosis of SARS-CoV-2 by FDA under an Emergency Use Authorization (EUA). This EUA will remain  in effect (meaning this test can be used) for the duration of the COVID-19 declaration under Section 564(b)(1) of the Act, 21 U.S.C.section 360bbb-3(b)(1), unless the authorization is terminated  or revoked sooner.       Influenza A by PCR NEGATIVE NEGATIVE Final   Influenza B by PCR NEGATIVE NEGATIVE Final    Comment: (NOTE) The Xpert Xpress SARS-CoV-2/FLU/RSV plus assay is intended as an aid in the diagnosis of influenza from Nasopharyngeal swab specimens and should not be used as a sole basis for treatment. Nasal washings and aspirates are unacceptable for Xpert Xpress SARS-CoV-2/FLU/RSV testing.  Fact  Sheet for Patients: EntrepreneurPulse.com.au  Fact Sheet for Healthcare Providers: IncredibleEmployment.be  This test is not yet approved or cleared by the [place] FDA and has been authorized for detection and/or diagnosis of SARS-CoV-2 by FDA under an Emergency Use Authorization (EUA). This EUA will remain in effect (meaning this test can be used) for the duration of the COVID-19 declaration under Section 564(b)(1) of the Act, 21 U.S.C. section 360bbb-3(b)(1), unless the authorization is terminated or revoked.     Resp Syncytial Virus by PCR NEGATIVE NEGATIVE Final    Comment: (NOTE) Fact Sheet for Patients: EntrepreneurPulse.com.au  Fact Sheet for Healthcare  Providers: IncredibleEmployment.be  This test is not yet approved or cleared by the [place] FDA and has been authorized for detection and/or diagnosis of SARS-CoV-2 by FDA under an Emergency Use Authorization (EUA). This EUA will remain in effect (meaning this test can be used) for the duration of the COVID-19 declaration under Section 564(b)(1) of the Act, 21 U.S.C. section 360bbb-3(b)(1), unless the authorization is terminated or revoked.  Performed at [facility], [place] [address] [place], [place] [address]   Blood culture (routine x 2)     Status: None (Preliminary result)   Collection Time: 01/24/23 11:17 AM   Specimen: BLOOD LEFT ARM  Result Value Ref Range Status   Specimen Description   Final    BLOOD LEFT ARM BOTTLES DRAWN AEROBIC AND ANAEROBIC Performed at [facility] [address], [place], [place] [address]    Special Requests   Final    Blood Culture adequate volume Performed at [facility] [address], [place], [place] [address]    Culture   Final    NO GROWTH < 24 HOURS Performed at [facility] [address], [place], [place] [address]    Report Status PENDING  Incomplete  Blood culture (routine x 2)     Status: None (Preliminary result)   Collection Time: 01/24/23 11:25 AM   Specimen: BLOOD LEFT HAND  Result Value Ref Range Status   Specimen Description BLOOD LEFT HAND  Final   Special Requests   Final    BOTTLES DRAWN AEROBIC AND ANAEROBIC Blood Culture adequate volume   Culture   Final    NO GROWTH < 24 HOURS Performed at [facility], [place] [address] [place], [place] [address]    Report Status PENDING  Incomplete  Respiratory (~20 pathogens) panel by PCR     Status: None   Collection Time: 01/24/23 12:33 PM   Specimen: Nasopharyngeal Swab; Respiratory  Result Value Ref Range Status   Adenovirus NOT DETECTED NOT DETECTED Final   Coronavirus 229E NOT  DETECTED NOT DETECTED Final    Comment: (NOTE) The Coronavirus on the Respiratory Panel, DOES NOT test for the novel  Coronavirus (2019 nCoV)    Coronavirus HKU1 NOT DETECTED NOT DETECTED Final   Coronavirus NL63 NOT DETECTED NOT DETECTED Final   Coronavirus OC43 NOT DETECTED NOT DETECTED Final   Metapneumovirus NOT DETECTED NOT DETECTED Final   Rhinovirus / Enterovirus NOT DETECTED NOT DETECTED Final   Influenza A NOT DETECTED NOT DETECTED Final   Influenza B NOT DETECTED NOT DETECTED Final   Parainfluenza Virus 1 NOT DETECTED NOT DETECTED Final   Parainfluenza Virus 2 NOT DETECTED NOT DETECTED Final   Parainfluenza Virus 3 NOT DETECTED NOT DETECTED Final   Parainfluenza Virus 4 NOT DETECTED NOT DETECTED Final   Respiratory Syncytial Virus NOT DETECTED NOT DETECTED  Final   Bordetella pertussis NOT DETECTED NOT DETECTED Final   Bordetella Parapertussis NOT DETECTED NOT DETECTED Final   Chlamydophila pneumoniae NOT DETECTED NOT DETECTED Final   Mycoplasma pneumoniae NOT DETECTED NOT DETECTED Final    Comment: Performed at [facility], [place] [address], [place], [place] [address]         Radiology Studies: DG Chest Portable 1 View  Result Date: 01/24/2023 CLINICAL DATA:  Shortness of breath, recent pneumonia and PE. EXAM: PORTABLE CHEST 1 VIEW COMPARISON:  Chest x-ray dated 12/06/2022. FINDINGS: Diffuse bilateral interstitial prominence, presumably edema, and patchy bibasilar airspace opacities. No pleural effusion or pneumothorax is seen. Heart size and mediastinal contours are stable. IMPRESSION: 1. Patchy bibasilar airspace opacities, multifocal pneumonia versus asymmetric pulmonary edema. 2. Bilateral interstitial prominence, also compatible with atypical pneumonia versus edema. Electronically Signed   By: Franki Cabot M.D.   On: 01/24/2023 10:40        Scheduled Meds:  ALPRAZolam  0.5 mg Oral BID   apixaban  5 mg Oral BID   benzonatate  200 mg Oral TID   docusate  sodium  100 mg Oral Daily   ferrous sulfate  325 mg Oral Daily   guaiFENesin  600 mg Oral BID   losartan  100 mg Oral Daily   And   hydrochlorothiazide  25 mg Oral Daily   insulin aspart  0-15 Units Subcutaneous TID WC   insulin aspart  0-5 Units Subcutaneous QHS   pantoprazole  40 mg Oral Daily   Continuous Infusions:  sodium chloride 75 mL/hr at 01/24/23 1513   ceFEPime (MAXIPIME) IV     vancomycin       LOS: 0 days    Time spent: 40 minutes    Irine Seal, MD [facility]   To contact the attending provider between 7A-7P or the covering provider during after hours 7P-7A, please log into the web site [email or web address] and access using universal [place] password for that web site. If you do not have the password, please call the hospital operator.  01/25/2023, 12:41 PM

## 2023-01-25 NOTE — Progress Notes (Signed)
RT NOTE:  ABG obtained and sent to lab. Lab notified. 

## 2023-01-26 ENCOUNTER — Inpatient Hospital Stay (HOSPITAL_COMMUNITY): Payer: Medicare HMO

## 2023-01-26 ENCOUNTER — Other Ambulatory Visit: Payer: Self-pay

## 2023-01-26 DIAGNOSIS — Z86711 Personal history of pulmonary embolism: Secondary | ICD-10-CM | POA: Diagnosis not present

## 2023-01-26 DIAGNOSIS — J189 Pneumonia, unspecified organism: Secondary | ICD-10-CM | POA: Diagnosis not present

## 2023-01-26 DIAGNOSIS — I509 Heart failure, unspecified: Secondary | ICD-10-CM

## 2023-01-26 DIAGNOSIS — R0602 Shortness of breath: Secondary | ICD-10-CM | POA: Diagnosis not present

## 2023-01-26 DIAGNOSIS — Z86718 Personal history of other venous thrombosis and embolism: Secondary | ICD-10-CM | POA: Diagnosis not present

## 2023-01-26 LAB — BASIC METABOLIC PANEL
Anion gap: 11 (ref 5–15)
BUN: 12 mg/dL (ref 8–23)
CO2: 24 mmol/L (ref 22–32)
Calcium: 8.3 mg/dL — ABNORMAL LOW (ref 8.9–10.3)
Chloride: 101 mmol/L (ref 98–111)
Creatinine, Ser: 1.03 mg/dL — ABNORMAL HIGH (ref 0.44–1.00)
GFR, Estimated: 55 mL/min — ABNORMAL LOW (ref >60)
Glucose, Bld: 144 mg/dL — ABNORMAL HIGH (ref 70–99)
Potassium: 3.3 mmol/L — ABNORMAL LOW (ref 3.5–5.1)
Sodium: 136 mmol/L (ref 135–145)

## 2023-01-26 LAB — CBC WITH DIFFERENTIAL/PLATELET
Abs Immature Granulocytes: 0.03 10*3/uL (ref 0.00–0.07)
Basophils Absolute: 0 10*3/uL (ref 0.0–0.1)
Basophils Relative: 0 %
Eosinophils Absolute: 0.2 10*3/uL (ref 0.0–0.5)
Eosinophils Relative: 3 %
HCT: 23.7 % — ABNORMAL LOW (ref 36.0–46.0)
Hemoglobin: 7.4 g/dL — ABNORMAL LOW (ref 12.0–15.0)
Immature Granulocytes: 0 %
Lymphocytes Relative: 17 %
Lymphs Abs: 1.2 10*3/uL (ref 0.7–4.0)
MCH: 27.6 pg (ref 26.0–34.0)
MCHC: 31.2 g/dL (ref 30.0–36.0)
MCV: 88.4 fL (ref 80.0–100.0)
Monocytes Absolute: 0.7 10*3/uL (ref 0.1–1.0)
Monocytes Relative: 11 %
Neutro Abs: 4.8 10*3/uL (ref 1.7–7.7)
Neutrophils Relative %: 69 %
Platelets: 200 10*3/uL (ref 150–400)
RBC: 2.68 MIL/uL — ABNORMAL LOW (ref 3.87–5.11)
RDW: 18.6 % — ABNORMAL HIGH (ref 11.5–15.5)
WBC: 7 10*3/uL (ref 4.0–10.5)
nRBC: 0 % (ref 0.0–0.2)

## 2023-01-26 LAB — ECHOCARDIOGRAM COMPLETE
AR max vel: 2.6 cm2
AV Area VTI: 2.78 cm2
AV Area mean vel: 2.56 cm2
AV Mean grad: 3 mmHg
AV Peak grad: 5.6 mmHg
Ao pk vel: 1.18 m/s
Area-P 1/2: 3.99 cm2
Calc EF: 60 %
Height: 68 in
S' Lateral: 2.4 cm
Single Plane A2C EF: 60.8 %
Single Plane A4C EF: 61.1 %
Weight: 3091.2 oz

## 2023-01-26 LAB — GLUCOSE, CAPILLARY
Glucose-Capillary: 131 mg/dL — ABNORMAL HIGH (ref 70–99)
Glucose-Capillary: 146 mg/dL — ABNORMAL HIGH (ref 70–99)
Glucose-Capillary: 203 mg/dL — ABNORMAL HIGH (ref 70–99)
Glucose-Capillary: 128 mg/dL — ABNORMAL HIGH (ref 70–99)

## 2023-01-26 LAB — MAGNESIUM: Magnesium: 1.6 mg/dL — ABNORMAL LOW (ref 1.7–2.4)

## 2023-01-26 MED ORDER — MAGNESIUM SULFATE 4 GM/100ML IV SOLN
4.0000 g | Freq: Once | INTRAVENOUS | Status: AC
  Administered 2023-01-26: 4 g via INTRAVENOUS
  Filled 2023-01-26: qty 100

## 2023-01-26 MED ORDER — SODIUM CHLORIDE 0.9 % IV SOLN
2.0000 g | Freq: Two times a day (BID) | INTRAVENOUS | Status: DC
  Administered 2023-01-26 – 2023-01-27 (×3): 2 g via INTRAVENOUS
  Filled 2023-01-26 (×4): qty 12.5

## 2023-01-26 MED ORDER — POTASSIUM CHLORIDE CRYS ER 10 MEQ PO TBCR
40.0000 meq | EXTENDED_RELEASE_TABLET | Freq: Once | ORAL | Status: AC
  Administered 2023-01-26: 40 meq via ORAL
  Filled 2023-01-26: qty 4

## 2023-01-26 NOTE — Progress Notes (Signed)
Patient was asked to place another IV in but continues to refuse at this time. Patient agreed to get another IV placed later in the day. Patient's antibiotic will not be given at this time due to loss of IV access. Lavon Paganini NP was made aware of the refusal

## 2023-01-26 NOTE — Progress Notes (Signed)
Patient is refusing to wear her cardiac monitor at this time. Patient educated but still continues to refuse. Gershon Cull, NP made aware.

## 2023-01-26 NOTE — Progress Notes (Deleted)
Patient Care Team: Elby Showers, MD as PCP - General (Internal Medicine) Inda Castle, MD (Inactive) as Consulting Physician (Gastroenterology) Truitt Merle, MD as Consulting Physician (Oncology)   CHIEF COMPLAINT:   Oncology History  Pancreatic cancer Victoria [name])  01/14/2023 Initial Diagnosis   Pancreatic cancer ([place])   01/27/2023 -  Chemotherapy   Patient is on Treatment Plan : PANCREAS Gemcitabine D1,8,15 (1000) q28d x 4 Cycles        CURRENT THERAPY:   INTERVAL HISTORY   ROS   Past Medical History:  Diagnosis Date   Abnormal finding on Pap smear    Allergy    SEASONAL   Anxiety    Breast cancer ([place]) 2000   right breast lumptectomy, radiation done   Cirrhosis ([place]) 11/2012   resolved per pt   Diabetes mellitus type 2, controlled ([place])    Hepatitis C    took tx for    Hypertension    Obesity    Thrombocytopenia ([place]) 11/2012     Past Surgical History:  Procedure Laterality Date   ANTERIOR AND POSTERIOR REPAIR N/A 02/28/2021   Procedure: ANTERIOR (CYSTOCELE) AND POSTERIOR REPAIR (RECTOCELE);  Surgeon: Cheri Fowler, MD;  Location: [facility];  Service: Gynecology;  Laterality: N/A;   BIOPSY  01/09/2023   Procedure: BIOPSY;  Surgeon: Irving Copas., MD;  Location: [facility];  Service: Gastroenterology;;   BREAST BIOPSY Left 09/09/2016   BREAST LUMPECTOMY  right   BREAST LUMPECTOMY     BREAST REDUCTION SURGERY  2005   CATARACT EXTRACTION, BILATERAL  2014   CHOLECYSTECTOMY N/A 03/30/2013   Procedure: LAPAROSCOPIC CHOLECYSTECTOMY WITH INTRAOPERATIVE CHOLANGIOGRAM;  Surgeon: Shann Medal, MD;  Location: [facility];  Service: General;  Laterality: N/A;   COLONOSCOPY  2021   CYSTOSCOPY N/A 02/28/2021   Procedure: Consuela Mimes;  Surgeon: Cheri Fowler, MD;  Location: [facility];  Service: Gynecology;  Laterality: N/A;   CYSTOSCOPY W/ RETROGRADES Left 02/28/2021   Procedure: CYSTOSCOPY WITH RETROGRADE PYELOGRAM;  Surgeon:  Cheri Fowler, MD;  Location: [facility];  Service: Gynecology;  Laterality: Left;   ESOPHAGOGASTRODUODENOSCOPY (EGD) WITH PROPOFOL N/A 11/25/2022   Procedure: ESOPHAGOGASTRODUODENOSCOPY (EGD) WITH PROPOFOL;  Surgeon: Carol Ada, MD;  Location: [facility];  Service: Gastroenterology;  Laterality: N/A;   ESOPHAGOGASTRODUODENOSCOPY (EGD) WITH PROPOFOL N/A 01/09/2023   Procedure: ESOPHAGOGASTRODUODENOSCOPY (EGD) WITH PROPOFOL;  Surgeon: Rush Landmark Telford Nab., MD;  Location: [facility];  Service: Gastroenterology;  Laterality: N/A;   EUS  01/28/2013   Procedure: UPPER ENDOSCOPIC ULTRASOUND (EUS) LINEAR;  Surgeon: Milus Banister, MD;  Location: [facility];  Service: Endoscopy;  Laterality: N/A;  secilia murrill [phone]   EUS Left 11/25/2022   Procedure: UPPER ENDOSCOPIC ULTRASOUND (EUS) LINEAR;  Surgeon: Carol Ada, MD;  Location: [facility];  Service: Gastroenterology;  Laterality: Left;   EUS N/A 01/09/2023   Procedure: UPPER ENDOSCOPIC ULTRASOUND (EUS) LINEAR;  Surgeon: Irving Copas., MD;  [facility];  Service: Gastroenterology;  Laterality: N/A;   EXCISION OF SKIN TAG Right 03/30/2013   Procedure: EXCISION OF SKIN TAG;  Surgeon: Shann Medal, MD;  Location: [facility];  Service: General;  Laterality: Right;   FINE NEEDLE ASPIRATION  11/25/2022   Procedure: FINE NEEDLE ASPIRATION (FNA) LINEAR;  Surgeon: Carol Ada, MD;  Location: [facility];  Service: Gastroenterology;;   FINE NEEDLE ASPIRATION N/A 01/09/2023   Procedure: FINE NEEDLE ASPIRATION (FNA) LINEAR;  Surgeon: Irving Copas., MD;  [facility];  Service: Gastroenterology;  Laterality: N/A;   LIVER BIOPSY  03/30/2013   Procedure: LIVER BIOPSY;  Surgeon: Shann Medal, MD;  Location: [facility];  Service: General;;   POLYPECTOMY     REDUCTION MAMMAPLASTY     THYROIDECTOMY, PARTIAL  1972   left   VAGINAL HYSTERECTOMY N/A 02/28/2021   Procedure: HYSTERECTOMY  VAGINAL WITH UNILATERAL SALPINGECTOMY;  Surgeon: Cheri Fowler, MD;  Location: [facility];  Service: Gynecology;  Laterality: N/A;     Facility-Administered Encounter Medications as of 01/27/2023  Medication   acetaminophen (TYLENOL) tablet 650 mg   Or   acetaminophen (TYLENOL) suppository 650 mg   albuterol (PROVENTIL) (2.5 MG/3ML) 0.083% nebulizer solution 2.5 mg   ALPRAZolam (XANAX) tablet 0.5 mg   apixaban (ELIQUIS) tablet 5 mg   benzonatate (TESSALON) capsule 200 mg   ceFEPIme (MAXIPIME) 2 g in sodium chloride 0.9 % 100 mL IVPB   docusate sodium (COLACE) capsule 100 mg   ferrous sulfate tablet 325 mg   furosemide (LASIX) injection 20 mg   guaiFENesin (MUCINEX) 12 hr tablet 600 mg   hydrALAZINE (APRESOLINE) injection 10 mg   losartan (COZAAR) tablet 100 mg   And   hydrochlorothiazide (HYDRODIURIL) tablet 25 mg   HYDROcodone-acetaminophen (NORCO/VICODIN) 5-325 MG per tablet 1 tablet   insulin aspart (novoLOG) injection 0-15 Units   insulin aspart (novoLOG) injection 0-5 Units   morphine (PF) 4 MG/ML injection 4 mg   ondansetron (ZOFRAN) tablet 4 mg   Or   ondansetron (ZOFRAN) injection 4 mg   oxymetazoline (AFRIN) 0.05 % nasal spray 2 spray   pantoprazole (PROTONIX) EC tablet 40 mg   Outpatient Encounter Medications as of 01/27/2023  Medication Sig Note   albuterol (VENTOLIN HFA) 108 (90 Base) MCG/ACT inhaler Inhale 2 puffs into the lungs every 6 (six) hours as needed for wheezing or shortness of breath.    ALPRAZolam (XANAX) 0.5 MG tablet TAKE 1 TABLET(0.5 MG) BY MOUTH TWICE DAILY AS NEEDED FOR ANXIETY (Patient taking differently: Take 0.5 mg by mouth See admin instructions. Take 0.5 mg by mouth in the morning and evening)    apixaban (ELIQUIS) 5 MG TABS tablet Take 1 tablet (5 mg total) by mouth 2 (two) times daily.    B Complex-C (B-COMPLEX WITH VITAMIN C) tablet Take 1 tablet by mouth daily.    benzonatate (TESSALON PERLES) 100 MG capsule Take 1 capsule (100  mg total) by mouth 3 (three) times daily as needed for cough.    Blood Glucose Monitoring Suppl (TRUE METRIX METER) w/Device KIT USE AS DIRECTED    cetirizine (ZYRTEC) 10 MG tablet Take 10 mg by mouth daily as needed (allergies).    chlorpheniramine-HYDROcodone (TUSSIONEX) 10-8 MG/5ML Take 5 mLs by mouth every 12 (twelve) hours as needed for cough.    cholecalciferol (VITAMIN D3) 25 MCG (1000 UNIT) tablet Take 1,000 Units by mouth in the morning.    docusate sodium (COLACE) 100 MG capsule Take 1 capsule (100 mg total) by mouth daily. (Patient taking differently: Take 100 mg by mouth every 6 (six) hours as needed (with norco for constipation prevention.).)    ferrous sulfate 325 (65 FE) MG tablet Take 1 tablet (325 mg total) by mouth daily.    glucose blood (TRUE METRIX BLOOD GLUCOSE TEST) test strip TEST BLOOD SUGAR TWICE DAILY    guaiFENesin (MUCINEX) 600 MG 12 hr tablet Take 1 tablet (600 mg total) by mouth 2 (two) times daily. (Patient not taking: Reported on 01/24/2023)  HYDROcodone-acetaminophen (NORCO/VICODIN) 5-325 MG tablet Take 1 tablet by mouth every 6 (six) hours as needed. (Patient taking differently: Take 1 tablet by mouth every 6 (six) hours as needed for moderate pain or severe pain.)    Lancets (FREESTYLE) lancets CHECK GLUCOSE TWICE A DAY    loperamide (IMODIUM) 2 MG capsule Take 2-4 mg by mouth 3 (three) times daily as needed for diarrhea or loose stools.    losartan-hydrochlorothiazide (HYZAAR) 100-25 MG tablet Take 1 tablet by mouth daily. 01/24/2023: Patient thinks she probably took this today, but it actually might have been yesterday   metFORMIN (GLUCOPHAGE) 500 MG tablet TAKE 1 TABLET(500 MG) BY MOUTH TWICE DAILY WITH A MEAL (Patient taking differently: Take 500 mg by mouth 2 (two) times daily with a meal.)    Multiple Vitamin (MULTIVITAMIN WITH MINERALS) TABS tablet Take 1 tablet by mouth in the morning.    oxymetazoline (AFRIN) 0.05 % nasal spray Place 2 sprays into both  nostrils 2 (two) times daily as needed for congestion.    pantoprazole (PROTONIX) 40 MG tablet Take 1 tablet (40 mg total) by mouth daily. (Patient taking differently: Take 40 mg by mouth daily before breakfast.)      There were no vitals filed for this visit. There is no height or weight on file to calculate BMI.   PHYSICAL EXAM GENERAL:alert, no distress and comfortable SKIN: no rash  EYES: sclera clear NECK: without mass LYMPH:  no palpable cervical or supraclavicular lymphadenopathy  LUNGS: clear with normal breathing effort HEART: regular rate & rhythm, no lower extremity edema ABDOMEN: abdomen soft, non-tender and normal bowel sounds NEURO: alert & oriented x 3 with fluent speech, no focal motor/sensory deficits Breast exam:  PAC without erythema    CBC    Component Value Date/Time   WBC 7.0 01/26/2023 0542   RBC 2.68 (L) 01/26/2023 0542   HGB 7.4 (L) 01/26/2023 0542   HCT 23.7 (L) 01/26/2023 0542   PLT 200 01/26/2023 0542   MCV 88.4 01/26/2023 0542   MCH 27.6 01/26/2023 0542   MCHC 31.2 01/26/2023 0542   RDW 18.6 (H) 01/26/2023 0542   LYMPHSABS 1.2 01/26/2023 0542   MONOABS 0.7 01/26/2023 0542   EOSABS 0.2 01/26/2023 0542   BASOSABS 0.0 01/26/2023 0542     CMP     Component Value Date/Time   NA 136 01/26/2023 0542   K 3.3 (L) 01/26/2023 0542   CL 101 01/26/2023 0542   CO2 24 01/26/2023 0542   GLUCOSE 144 (H) 01/26/2023 0542   BUN 12 01/26/2023 0542   CREATININE 1.03 (H) 01/26/2023 0542   CREATININE 1.03 (H) 05/31/2022 0955   CALCIUM 8.3 (L) 01/26/2023 0542   PROT 6.5 01/25/2023 0559   ALBUMIN 2.6 (L) 01/25/2023 0559   AST 26 01/25/2023 0559   ALT 20 01/25/2023 0559   ALKPHOS 105 01/25/2023 0559   BILITOT 0.6 01/25/2023 0559   GFRNONAA 55 (L) 01/26/2023 0542   GFRNONAA 57 (L) 11/28/2020 1000   GFRAA 66 11/28/2020 1000     ASSESSMENT & PLAN:  PLAN:  No orders of the defined types were placed in this encounter.     All questions were answered.  The patient knows to call the clinic with any problems, questions or concerns. No barriers to learning were detected. I spent *** counseling the patient face to face. The total time spent in the appointment was *** and more than 50% was on counseling, review of test results, and coordination of care.  Cira Rue, NP-C '@DATE'$ @

## 2023-01-26 NOTE — Progress Notes (Signed)
  Echocardiogram 2D Echocardiogram has been performed.  Lana Fish 01/26/2023, 9:15 AM

## 2023-01-26 NOTE — Progress Notes (Signed)
Pharmacy Antibiotic Note  Victoria [name] is a 82 y.o. female admitted on 01/24/2023 with pneumonia.  On 2/2, Pharmacy consulted for Cefepime dosing.  Active Problem(s): SOB  PMH:  PE/DVT, DM2, pancreatic CA, HTN , anxiety, breast cancer, cirrhosis, obesity, thrombocytopenia  ID: PNA,  Afebrile, WBC WNL, Scr up 1.03, Lactic acid 1.6>1.4  Vanco 2/2>>2/4 Cefepime 2/2>>  Microbiology results:  2/2 BCx: NGTD 2/3 MRSA PCR: not detected   Plan: Continue Cefepime 2g IV q12 hrs (when est crcl 30-60 ml/min) Monitor clinical progress, renal function F/U C&S, abx deescalation / LOT     Height: '5\' 8"'$  (172.7 cm) Weight: 87.6 kg (193 lb 3.2 oz) IBW/kg (Calculated) : 63.9  Temp (24hrs), Avg:98.2 F (36.8 C), Min:97.7 F (36.5 C), Max:98.8 F (37.1 C)  Recent Labs  Lab 01/20/23 0302 01/24/23 1045 01/24/23 1120 01/24/23 1326 01/25/23 0559 01/26/23 0542  WBC 6.7 8.3  --   --  6.8 7.0  CREATININE  --  0.89  --   --  0.83 1.03*  LATICACIDVEN  --   --  1.6 1.4  --   --      Estimated Creatinine Clearance: 51.3 mL/min (A) (by C-G formula based on SCr of 1.03 mg/dL (H)).    Allergies  Allergen Reactions   Lisinopril Cough    Thank you for allowing pharmacy to be a part of this patient's care.  Royetta Asal, PharmD, BCPS Clinical Pharmacist [place] Please utilize Amion for appropriate phone number to reach the unit pharmacist ([place]) 01/26/2023 10:11 AM

## 2023-01-26 NOTE — Progress Notes (Addendum)
This patient's IV was infiltrated and was asked to have this nurse or the IV team to place another IV but the patient refused to have another IV placed. This nurse educated the patient on the reason for the IV access but the patient still refused. At this time, the patient has an order for IV Lasix that cannot be given due to the lost of her IV access. Gershon Cull, NP has been notified of the patient's refusal.

## 2023-01-26 NOTE — Progress Notes (Signed)
PROGRESS NOTE    Victoria [name]  UVO:[id number] DOB: 01/19/1941 DOA: 01/24/2023 PCP: Elby Showers, MD    Chief Complaint  Patient presents with   Shortness of Breath    Brief Narrative:  HPI per Dr. Kandis Nab is a 82 y.o. female with medical history significant of PE/DVT, DM2, pancreatic CA, HTN. Presenting with weakness and shortness of breath. She reports that she had been doing well since her recent discharge from the hospital. However, 2 days ago, she noticed that she was having productive cough and weakness. She didn't have any fever, N/V/D, sick contacts. She took some OTC meds and prescribed cough meds. However, they didn't help. This morning, her weakness worsened so she decided to come to the ED for evaluation. She denies any other aggravating or alleviating factors.     Assessment & Plan:   Principal Problem:   Multifocal pneumonia Active Problems:   DM2 (diabetes mellitus, type 2) ([place])   Hypertension   Pancreatic cancer ([place])   History of DVT (deep vein thrombosis)   History of pulmonary embolism   Normocytic anemia  #1 shortness of breath/??  Multifocal pneumonia versus acute CHF exacerbation. -Patient presented with generalized weakness, shortness of breath. -Patient noted to have recently hospitalized and treated for a atypical pneumonia with 5 days of IV Rocephin and IV azithromycin. -BNP within normal limits. -Procalcitonin negative. -MRSA PCR negative. -Blood cultures pending. -Respiratory viral panel negative. -SARS coronavirus PCR negative, influenza A and B PCR negative, respiratory syncytial virus negative. -Discontinue IV vancomycin and continue IV cefepime.   -Check a 2D echo.   -Patient received Lasix 20 mg IV x 1 yesterday with urine output of approximately 2 L with some clinical improvement.   -Continue Lasix 20 mg IV every 12 hours, strict I's and O's, daily weights.   2.  Hypertension -Continue home regimen Cozaar,  HCTZ. -Patient started on Lasix 20 mg IV every 12 hours.    3.  History of DVT/PE -Continue Eliquis.  4.  Diabetes mellitus type 2 -Hemoglobin A1c 6.7 (12/06/2022) -CBG 131 this morning.   -Continue to hold oral hypoglycemic agents.   -SSI.   5.  Normocytic anemia -Anemia panel consistent with anemia of chronic disease. -Hemoglobin stable at 7.4. -Follow H&H. -Transfusion threshold hemoglobin < 7.  6.  History of pancreatic cancer -Outpatient follow-up with oncology.    DVT prophylaxis: Eliquis Code Status: Full Family Communication: Updated patient.  No family at bedside. Disposition: TBD.  Status is: Inpatient Remains inpatient appropriate because: Severity of illness.   Consultants:  None  Procedures:  Chest x-ray 01/24/2023    Antimicrobials:  IV cefepime 01/24/2023>>>>> IV vancomycin 01/24/2023>>>> 01/26/2023   Subjective: Laying in bed.  Still with cough with clear sputum.  Feels shortness of breath is slowly improving since admission.  Stated had significant urine output after initial dose of IV Lasix.  Due to IV malfunction unable to receive IV Lasix last night and just got Lasix this morning per patient.    Objective: Vitals:   01/25/23 1320 01/25/23 2030 01/26/23 0500 01/26/23 0504  BP: 134/87 135/83  (!) 144/87  Pulse: (!) 104 91  85  Resp: '18 18  18  '$ Temp: 98.8 F (37.1 C) 98 F (36.7 C)  97.7 F (36.5 C)  TempSrc: Oral Oral  Oral  SpO2: 97% 95%  97%  Weight:   87.6 kg   Height:        Intake/Output Summary (Last 24 hours) at  01/26/2023 1107 Last data filed at 01/26/2023 0900 Gross per 24 hour  Intake 1080 ml  Output 2550 ml  Net -1470 ml    Filed Weights   01/24/23 1400 01/24/23 1514 01/26/23 0500  Weight: 83.9 kg 88.9 kg 87.6 kg    Examination:  General exam: NAD. Respiratory system: Some decreased breath sounds in the bases.  Diffuse coarse breath sounds/crackles.  Poor to fair air movement.  Minimal expiratory  wheezing. Cardiovascular system: Regular rate and rhythm no murmurs rubs or gallops.  No JVD.  No lower extremity edema.  Gastrointestinal system: Abdomen is soft, nontender, nondistended, positive bowel sounds.  No rebound.  No guarding.  Central nervous system: Alert and oriented. No focal neurological deficits. Extremities: Symmetric 5 x 5 power. Skin: No rashes, lesions or ulcers Psychiatry: Judgement and insight appear normal. Mood & affect appropriate.     Data Reviewed: I have personally reviewed following labs and imaging studies  CBC: Recent Labs  Lab 01/20/23 0302 01/24/23 1045 01/25/23 0559 01/26/23 0542  WBC 6.7 8.3 6.8 7.0  NEUTROABS 4.4 6.4  --  4.8  HGB 7.3* 8.0* 7.4* 7.4*  HCT 23.9* 25.4* 23.4* 23.7*  MCV 92.3 88.2 87.6 88.4  PLT 133* 210 191 200     Basic Metabolic Panel: Recent Labs  Lab 01/24/23 1045 01/25/23 0559 01/26/23 0542  NA 134* 137 136  K 3.6 3.6 3.3*  CL 100 105 101  CO2 '25 23 24  '$ GLUCOSE 189* 129* 144*  BUN '11 9 12  '$ CREATININE 0.89 0.83 1.03*  CALCIUM 8.2* 8.1* 8.3*  MG  --   --  1.6*     GFR: Estimated Creatinine Clearance: 51.3 mL/min (A) (by C-G formula based on SCr of 1.03 mg/dL (H)).  Liver Function Tests: Recent Labs  Lab 01/24/23 1045 01/25/23 0559  AST 31 26  ALT 23 20  ALKPHOS 130* 105  BILITOT 0.9 0.6  PROT 6.9 6.5  ALBUMIN 3.0* 2.6*     CBG: Recent Labs  Lab 01/25/23 0756 01/25/23 1220 01/25/23 1655 01/25/23 2119 01/26/23 0725  GLUCAP 123* 136* 126* 138* 131*      Recent Results (from the past 240 hour(s))  Resp panel by RT-PCR (RSV, Flu A&B, Covid) Anterior Nasal Swab     Status: None   Collection Time: 01/24/23 10:15 AM   Specimen: Anterior Nasal Swab  Result Value Ref Range Status   SARS Coronavirus 2 by RT PCR NEGATIVE NEGATIVE Final    Comment: (NOTE) SARS-CoV-2 target nucleic acids are NOT DETECTED.  The SARS-CoV-2 RNA is generally detectable in upper respiratory specimens during the  acute phase of infection. The lowest concentration of SARS-CoV-2 viral copies this assay can detect is 138 copies/mL. A negative result does not preclude SARS-Cov-2 infection and should not be used as the sole basis for treatment or other patient management decisions. A negative result may occur with  improper specimen collection/handling, submission of specimen other than nasopharyngeal swab, presence of viral mutation(s) within the areas targeted by this assay, and inadequate number of viral copies(<138 copies/mL). A negative result must be combined with clinical observations, patient history, and epidemiological information. The expected result is Negative.  Fact Sheet for Patients:  EntrepreneurPulse.com.au  Fact Sheet for Healthcare Providers:  IncredibleEmployment.be  This test is no t yet approved or cleared by the [place] FDA and  has been authorized for detection and/or diagnosis of SARS-CoV-2 by FDA under an Emergency Use Authorization (EUA). This EUA will remain  in  effect (meaning this test can be used) for the duration of the COVID-19 declaration under Section 564(b)(1) of the Act, 21 U.S.C.section 360bbb-3(b)(1), unless the authorization is terminated  or revoked sooner.       Influenza A by PCR NEGATIVE NEGATIVE Final   Influenza B by PCR NEGATIVE NEGATIVE Final    Comment: (NOTE) The Xpert Xpress SARS-CoV-2/FLU/RSV plus assay is intended as an aid in the diagnosis of influenza from Nasopharyngeal swab specimens and should not be used as a sole basis for treatment. Nasal washings and aspirates are unacceptable for Xpert Xpress SARS-CoV-2/FLU/RSV testing.  Fact Sheet for Patients: EntrepreneurPulse.com.au  Fact Sheet for Healthcare Providers: IncredibleEmployment.be  This test is not yet approved or cleared by the [place] FDA and has been authorized for detection and/or  diagnosis of SARS-CoV-2 by FDA under an Emergency Use Authorization (EUA). This EUA will remain in effect (meaning this test can be used) for the duration of the COVID-19 declaration under Section 564(b)(1) of the Act, 21 U.S.C. section 360bbb-3(b)(1), unless the authorization is terminated or revoked.     Resp Syncytial Virus by PCR NEGATIVE NEGATIVE Final    Comment: (NOTE) Fact Sheet for Patients: EntrepreneurPulse.com.au  Fact Sheet for Healthcare Providers: IncredibleEmployment.be  This test is not yet approved or cleared by the [place] FDA and has been authorized for detection and/or diagnosis of SARS-CoV-2 by FDA under an Emergency Use Authorization (EUA). This EUA will remain in effect (meaning this test can be used) for the duration of the COVID-19 declaration under Section 564(b)(1) of the Act, 21 U.S.C. section 360bbb-3(b)(1), unless the authorization is terminated or revoked.  Performed at [facility], [place] [address] [place], [place] [address]   Blood culture (routine x 2)     Status: None (Preliminary result)   Collection Time: 01/24/23 11:17 AM   Specimen: BLOOD LEFT ARM  Result Value Ref Range Status   Specimen Description   Final    BLOOD LEFT ARM BOTTLES DRAWN AEROBIC AND ANAEROBIC Performed at [facility] [address], [place], [place] [address]    Special Requests   Final    Blood Culture adequate volume Performed at [facility] [address], [place], [place] [address]    Culture   Final    NO GROWTH 2 DAYS Performed at [facility] [address], [place], [place] [address]    Report Status PENDING  Incomplete  Blood culture (routine x 2)     Status: None (Preliminary result)   Collection Time: 01/24/23 11:25 AM   Specimen: BLOOD LEFT HAND  Result Value Ref Range Status   Specimen Description BLOOD LEFT HAND  Final   Special Requests    Final    BOTTLES DRAWN AEROBIC AND ANAEROBIC Blood Culture adequate volume   Culture   Final    NO GROWTH 2 DAYS Performed at [facility], [place] [address], [place], [place] [address]    Report Status PENDING  Incomplete  Respiratory (~20 pathogens) panel by PCR     Status: None   Collection Time: 01/24/23 12:33 PM   Specimen: Nasopharyngeal Swab; Respiratory  Result Value Ref Range Status   Adenovirus NOT DETECTED NOT DETECTED Final   Coronavirus 229E NOT DETECTED NOT DETECTED Final    Comment: (NOTE) The Coronavirus on the Respiratory Panel, DOES NOT test for the novel  Coronavirus (2019 nCoV)    Coronavirus HKU1 NOT DETECTED NOT DETECTED Final  Coronavirus NL63 NOT DETECTED NOT DETECTED Final   Coronavirus OC43 NOT DETECTED NOT DETECTED Final   Metapneumovirus NOT DETECTED NOT DETECTED Final   Rhinovirus / Enterovirus NOT DETECTED NOT DETECTED Final   Influenza A NOT DETECTED NOT DETECTED Final   Influenza B NOT DETECTED NOT DETECTED Final   Parainfluenza Virus 1 NOT DETECTED NOT DETECTED Final   Parainfluenza Virus 2 NOT DETECTED NOT DETECTED Final   Parainfluenza Virus 3 NOT DETECTED NOT DETECTED Final   Parainfluenza Virus 4 NOT DETECTED NOT DETECTED Final   Respiratory Syncytial Virus NOT DETECTED NOT DETECTED Final   Bordetella pertussis NOT DETECTED NOT DETECTED Final   Bordetella Parapertussis NOT DETECTED NOT DETECTED Final   Chlamydophila pneumoniae NOT DETECTED NOT DETECTED Final   Mycoplasma pneumoniae NOT DETECTED NOT DETECTED Final    Comment: Performed at [facility], [place] [address], [place], [place] [address]  MRSA Next Gen by PCR, Nasal     Status: None   Collection Time: 01/25/23  1:15 PM   Specimen: Nasal Mucosa; Nasal Swab  Result Value Ref Range Status   MRSA by PCR Next Gen NOT DETECTED NOT DETECTED Final    Comment: (NOTE) The GeneXpert MRSA Assay (FDA approved for NASAL specimens only), is one component of a comprehensive MRSA  colonization surveillance program. It is not intended to diagnose MRSA infection nor to guide or monitor treatment for MRSA infections. Test performance is not FDA approved in patients less than 36 years old. Performed at [facility] [place] [address] [place], [place] [address]          Radiology Studies: No results found.      Scheduled Meds:  ALPRAZolam  0.5 mg Oral BID   apixaban  5 mg Oral BID   benzonatate  200 mg Oral TID   docusate sodium  100 mg Oral Daily   ferrous sulfate  325 mg Oral Daily   furosemide  20 mg Intravenous Q12H   guaiFENesin  600 mg Oral BID   losartan  100 mg Oral Daily   And   hydrochlorothiazide  25 mg Oral Daily   insulin aspart  0-15 Units Subcutaneous TID WC   insulin aspart  0-5 Units Subcutaneous QHS   pantoprazole  40 mg Oral Daily   Continuous Infusions:  ceFEPime (MAXIPIME) IV     magnesium sulfate bolus IVPB 4 g (01/26/23 0925)     LOS: 1 day    Time spent: 40 minutes    Irine Seal, MD [facility]   To contact the attending provider between 7A-7P or the covering provider during after hours 7P-7A, please log into the web site [email or web address] and access using universal [place] password for that web site. If you do not have the password, please call the hospital operator.  01/26/2023, 11:07 AM

## 2023-01-27 ENCOUNTER — Inpatient Hospital Stay: Payer: Medicare HMO | Admitting: Nurse Practitioner

## 2023-01-27 ENCOUNTER — Inpatient Hospital Stay: Payer: Medicare HMO | Admitting: Licensed Clinical Social Worker

## 2023-01-27 ENCOUNTER — Inpatient Hospital Stay: Payer: Medicare HMO

## 2023-01-27 DIAGNOSIS — R0602 Shortness of breath: Secondary | ICD-10-CM | POA: Diagnosis not present

## 2023-01-27 DIAGNOSIS — J189 Pneumonia, unspecified organism: Secondary | ICD-10-CM | POA: Diagnosis not present

## 2023-01-27 DIAGNOSIS — Z86711 Personal history of pulmonary embolism: Secondary | ICD-10-CM | POA: Diagnosis not present

## 2023-01-27 DIAGNOSIS — Z86718 Personal history of other venous thrombosis and embolism: Secondary | ICD-10-CM | POA: Diagnosis not present

## 2023-01-27 LAB — BASIC METABOLIC PANEL
Anion gap: 15 (ref 5–15)
BUN: 12 mg/dL (ref 8–23)
CO2: 24 mmol/L (ref 22–32)
Calcium: 8.7 mg/dL — ABNORMAL LOW (ref 8.9–10.3)
Chloride: 97 mmol/L — ABNORMAL LOW (ref 98–111)
Creatinine, Ser: 0.91 mg/dL (ref 0.44–1.00)
GFR, Estimated: >60 mL/min (ref >60)
Glucose, Bld: 160 mg/dL — ABNORMAL HIGH (ref 70–99)
Potassium: 3.2 mmol/L — ABNORMAL LOW (ref 3.5–5.1)
Sodium: 136 mmol/L (ref 135–145)

## 2023-01-27 LAB — GLUCOSE, CAPILLARY
Glucose-Capillary: 183 mg/dL — ABNORMAL HIGH (ref 70–99)
Glucose-Capillary: 213 mg/dL — ABNORMAL HIGH (ref 70–99)
Glucose-Capillary: 92 mg/dL (ref 70–99)
Glucose-Capillary: 141 mg/dL — ABNORMAL HIGH (ref 70–99)

## 2023-01-27 LAB — CBC
HCT: 25.8 % — ABNORMAL LOW (ref 36.0–46.0)
Hemoglobin: 8 g/dL — ABNORMAL LOW (ref 12.0–15.0)
MCH: 27.9 pg (ref 26.0–34.0)
MCHC: 31 g/dL (ref 30.0–36.0)
MCV: 89.9 fL (ref 80.0–100.0)
Platelets: 258 10*3/uL (ref 150–400)
RBC: 2.87 MIL/uL — ABNORMAL LOW (ref 3.87–5.11)
RDW: 18.6 % — ABNORMAL HIGH (ref 11.5–15.5)
WBC: 8.3 10*3/uL (ref 4.0–10.5)
nRBC: 0 % (ref 0.0–0.2)

## 2023-01-27 LAB — MAGNESIUM: Magnesium: 2.1 mg/dL (ref 1.7–2.4)

## 2023-01-27 MED ORDER — POTASSIUM CHLORIDE CRYS ER 10 MEQ PO TBCR
40.0000 meq | EXTENDED_RELEASE_TABLET | ORAL | Status: AC
  Administered 2023-01-27 (×2): 40 meq via ORAL
  Filled 2023-01-27 (×2): qty 4

## 2023-01-27 MED ORDER — FUROSEMIDE 10 MG/ML IJ SOLN
20.0000 mg | Freq: Two times a day (BID) | INTRAMUSCULAR | Status: AC
  Administered 2023-01-27 – 2023-01-28 (×4): 20 mg via INTRAVENOUS
  Filled 2023-01-27 (×4): qty 2

## 2023-01-27 NOTE — Plan of Care (Signed)

## 2023-01-27 NOTE — Progress Notes (Signed)
PROGRESS NOTE    Victoria [name]  HMC:[id number] DOB: [birth year] DOA: 01/24/2023 PCP: Elby Showers, MD    Chief Complaint  Patient presents with   Shortness of Breath    Brief Narrative:  HPI per Dr. Kandis Nab is a 82 y.o. female with medical history significant of PE/DVT, DM2, pancreatic CA, HTN. Presenting with weakness and shortness of breath. She reports that she had been doing well since her recent discharge from the hospital. However, 2 days ago, she noticed that she was having productive cough and weakness. She didn't have any fever, N/V/D, sick contacts. She took some OTC meds and prescribed cough meds. However, they didn't help. This morning, her weakness worsened so she decided to come to the ED for evaluation. She denies any other aggravating or alleviating factors.     Assessment & Plan:   Principal Problem:   Multifocal pneumonia Active Problems:   DM2 (diabetes mellitus, type 2) ([place])   Hypertension   Pancreatic cancer ([place])   History of DVT (deep vein thrombosis)   History of pulmonary embolism   Normocytic anemia  #1 shortness of breath/??  Multifocal pneumonia versus acute CHF exacerbation. -Patient presented with generalized weakness, shortness of breath. -Patient noted to have recently hospitalized and treated for a atypical pneumonia with 5 days of IV Rocephin and IV azithromycin. -BNP within normal limits. -Procalcitonin negative. -MRSA PCR negative. -Blood cultures pending with no growth to date x 3 days. -Respiratory viral panel negative. -SARS coronavirus PCR negative, influenza A and B PCR negative, respiratory syncytial virus negative. -Discontinued IV vancomycin and continue IV cefepime.   -2D echo with EF 60 to 65%, NWMA, grade 1 diastolic dysfunction.   -Patient started on Lasix 20 mg IV every 12 hours with urine output of 2.8 L over the past 24 hours.   -Continue Lasix 20 mg IV every 12 hours.   -Strict I's and O's, daily  weights.   -If continued improvement could likely transition to oral antibiotics and treat empirically for total of 5 days.   2.  Hypertension -Continue home regimen Cozaar, HCTZ. -Currently on Lasix 20 mg IV every 12 hours.   3.  History of DVT/PE -Eliquis  4.  Diabetes mellitus type 2 -Hemoglobin A1c 6.7 (12/06/2022) -CBG 183 this morning. -Continue to hold oral hypoglycemic agents.   -SSI.   5.  Normocytic anemia -Anemia panel consistent with anemia of chronic disease. -Hemoglobin stable at 8.0. -Follow H&H. -Transfusion threshold hemoglobin < 7.  6.  History of pancreatic cancer -Outpatient follow-up with oncology. -Oncology notified of admission via epic.  7.  Hypokalemia -Likely secondary to diuretics. -Replete.    DVT prophylaxis: Eliquis Code Status: Full Family Communication: Updated patient.  No family at bedside. Disposition: TBD.  Status is: Inpatient Remains inpatient appropriate because: Severity of illness.   Consultants:  None  Procedures:  Chest x-ray 01/24/2023 2D echo 01/26/2023    Antimicrobials:  IV cefepime 01/24/2023>>>>> IV vancomycin 01/24/2023>>>> 01/26/2023   Subjective: Patient sitting up in bed on her phone.  Feels significantly better than when she came on admission.  Shortness of breath and cough improving.  States having good urine output with IV Lasix.  Concerned she had an appointment at the cancer center that she is missing.   Objective: Vitals:   01/26/23 1932 01/27/23 0446 01/27/23 0500 01/27/23 0900  BP: 132/78 139/88    Pulse: 97 98    Resp: 20 20    Temp: 97.6 F (36.4  C) (!) 97.5 F (36.4 C)    TempSrc: Oral Oral    SpO2: 94% 93%  91%  Weight:   84.8 kg   Height:        Intake/Output Summary (Last 24 hours) at 01/27/2023 1213 Last data filed at 01/27/2023 1030 Gross per 24 hour  Intake 550 ml  Output 2900 ml  Net -2350 ml    Filed Weights   01/24/23 1514 01/26/23 0500 01/27/23 0500  Weight: 88.9 kg 87.6 kg  84.8 kg    Examination:  General exam: NAD Respiratory system: Decreased breath sounds in the bases.  Improving diffuse coarse breath sounds/crackles.  Air movement improving.  Minimal expiratory wheezing.  Cardiovascular system: RRR no murmurs rubs or gallops.  No JVD.  No lower extremity edema.  Gastrointestinal system: Abdomen is soft, nontender, nondistended, positive bowel sounds.  No rebound.  No guarding.  Central nervous system: Alert and oriented.  Moving extremities spontaneously.  No focal neurological deficits.  Extremities: Symmetric 5 x 5 power. Skin: No rashes, lesions or ulcers Psychiatry: Judgement and insight appear normal. Mood & affect appropriate.     Data Reviewed: I have personally reviewed following labs and imaging studies  CBC: Recent Labs  Lab 01/24/23 1045 01/25/23 0559 01/26/23 0542 01/27/23 0917  WBC 8.3 6.8 7.0 8.3  NEUTROABS 6.4  --  4.8  --   HGB 8.0* 7.4* 7.4* 8.0*  HCT 25.4* 23.4* 23.7* 25.8*  MCV 88.2 87.6 88.4 89.9  PLT 210 191 200 258     Basic Metabolic Panel: Recent Labs  Lab 01/24/23 1045 01/25/23 0559 01/26/23 0542 01/27/23 0509 01/27/23 0917  NA 134* 137 136  --  136  K 3.6 3.6 3.3*  --  3.2*  CL 100 105 101  --  97*  CO2 '25 23 24  '$ --  24  GLUCOSE 189* 129* 144*  --  160*  BUN '11 9 12  '$ --  12  CREATININE 0.89 0.83 1.03*  --  0.91  CALCIUM 8.2* 8.1* 8.3*  --  8.7*  MG  --   --  1.6* 2.1  --      GFR: Estimated Creatinine Clearance: 57.2 mL/min (by C-G formula based on SCr of 0.91 mg/dL).  Liver Function Tests: Recent Labs  Lab 01/24/23 1045 01/25/23 0559  AST 31 26  ALT 23 20  ALKPHOS 130* 105  BILITOT 0.9 0.6  PROT 6.9 6.5  ALBUMIN 3.0* 2.6*     CBG: Recent Labs  Lab 01/26/23 1139 01/26/23 1652 01/26/23 2046 01/27/23 0800 01/27/23 1132  GLUCAP 146* 203* 128* 183* 213*      Recent Results (from the past 240 hour(s))  Resp panel by RT-PCR (RSV, Flu A&B, Covid) Anterior Nasal Swab     Status:  None   Collection Time: 01/24/23 10:15 AM   Specimen: Anterior Nasal Swab  Result Value Ref Range Status   SARS Coronavirus 2 by RT PCR NEGATIVE NEGATIVE Final    Comment: (NOTE) SARS-CoV-2 target nucleic acids are NOT DETECTED.  The SARS-CoV-2 RNA is generally detectable in upper respiratory specimens during the acute phase of infection. The lowest concentration of SARS-CoV-2 viral copies this assay can detect is 138 copies/mL. A negative result does not preclude SARS-Cov-2 infection and should not be used as the sole basis for treatment or other patient management decisions. A negative result may occur with  improper specimen collection/handling, submission of specimen other than nasopharyngeal swab, presence of viral mutation(s) within the areas targeted  by this assay, and inadequate number of viral copies(<138 copies/mL). A negative result must be combined with clinical observations, patient history, and epidemiological information. The expected result is Negative.  Fact Sheet for Patients:  EntrepreneurPulse.com.au  Fact Sheet for Healthcare Providers:  IncredibleEmployment.be  This test is no t yet approved or cleared by the [place] FDA and  has been authorized for detection and/or diagnosis of SARS-CoV-2 by FDA under an Emergency Use Authorization (EUA). This EUA will remain  in effect (meaning this test can be used) for the duration of the COVID-19 declaration under Section 564(b)(1) of the Act, 21 U.S.C.section 360bbb-3(b)(1), unless the authorization is terminated  or revoked sooner.       Influenza A by PCR NEGATIVE NEGATIVE Final   Influenza B by PCR NEGATIVE NEGATIVE Final    Comment: (NOTE) The Xpert Xpress SARS-CoV-2/FLU/RSV plus assay is intended as an aid in the diagnosis of influenza from Nasopharyngeal swab specimens and should not be used as a sole basis for treatment. Nasal washings and aspirates are unacceptable  for Xpert Xpress SARS-CoV-2/FLU/RSV testing.  Fact Sheet for Patients: EntrepreneurPulse.com.au  Fact Sheet for Healthcare Providers: IncredibleEmployment.be  This test is not yet approved or cleared by the [place] FDA and has been authorized for detection and/or diagnosis of SARS-CoV-2 by FDA under an Emergency Use Authorization (EUA). This EUA will remain in effect (meaning this test can be used) for the duration of the COVID-19 declaration under Section 564(b)(1) of the Act, 21 U.S.C. section 360bbb-3(b)(1), unless the authorization is terminated or revoked.     Resp Syncytial Virus by PCR NEGATIVE NEGATIVE Final    Comment: (NOTE) Fact Sheet for Patients: EntrepreneurPulse.com.au  Fact Sheet for Healthcare Providers: IncredibleEmployment.be  This test is not yet approved or cleared by the [place] FDA and has been authorized for detection and/or diagnosis of SARS-CoV-2 by FDA under an Emergency Use Authorization (EUA). This EUA will remain in effect (meaning this test can be used) for the duration of the COVID-19 declaration under Section 564(b)(1) of the Act, 21 U.S.C. section 360bbb-3(b)(1), unless the authorization is terminated or revoked.  Performed at [facility], [place] [address] [place], [place] [address]   Blood culture (routine x 2)     Status: None (Preliminary result)   Collection Time: 01/24/23 11:17 AM   Specimen: BLOOD LEFT ARM  Result Value Ref Range Status   Specimen Description   Final    BLOOD LEFT ARM BOTTLES DRAWN AEROBIC AND ANAEROBIC Performed at [facility] [address], [place], [place] [address]    Special Requests   Final    Blood Culture adequate volume Performed at [facility] [address], [place], [place] [address]    Culture   Final    NO GROWTH 3 DAYS Performed at [facility], [place] [address] [place], [place] [address]    Report Status PENDING  Incomplete  Blood culture (routine x 2)     Status: None (Preliminary result)   Collection Time: 01/24/23 11:25 AM   Specimen: BLOOD LEFT HAND  Result Value Ref Range Status   Specimen Description BLOOD LEFT HAND  Final   Special Requests   Final    BOTTLES DRAWN AEROBIC AND ANAEROBIC Blood Culture adequate volume   Culture   Final    NO GROWTH 3 DAYS Performed at [facility], [place] [address], [place], [place] [address]    Report Status PENDING  Incomplete  Respiratory (~20 pathogens) panel by PCR     Status: None   Collection Time: 01/24/23 12:33 PM   Specimen: Nasopharyngeal Swab; Respiratory  Result Value Ref Range Status   Adenovirus NOT DETECTED NOT DETECTED Final   Coronavirus 229E NOT DETECTED NOT DETECTED Final    Comment: (NOTE) The Coronavirus on the Respiratory Panel, DOES NOT test for the novel  Coronavirus (2019 nCoV)    Coronavirus HKU1 NOT DETECTED NOT DETECTED Final   Coronavirus NL63 NOT DETECTED NOT DETECTED Final   Coronavirus OC43 NOT DETECTED NOT DETECTED Final   Metapneumovirus NOT DETECTED NOT DETECTED Final   Rhinovirus / Enterovirus NOT DETECTED NOT DETECTED Final   Influenza A NOT DETECTED NOT DETECTED Final   Influenza B NOT DETECTED NOT DETECTED Final   Parainfluenza Virus 1 NOT DETECTED NOT DETECTED Final   Parainfluenza Virus 2 NOT DETECTED NOT DETECTED Final   Parainfluenza Virus 3 NOT DETECTED NOT DETECTED Final   Parainfluenza Virus 4 NOT DETECTED NOT DETECTED Final   Respiratory Syncytial Virus NOT DETECTED NOT DETECTED Final   Bordetella pertussis NOT DETECTED NOT DETECTED Final   Bordetella Parapertussis NOT DETECTED NOT DETECTED Final   Chlamydophila pneumoniae NOT DETECTED NOT DETECTED Final   Mycoplasma pneumoniae NOT DETECTED NOT DETECTED Final    Comment: Performed at [facility], [place]. [address] [place], [place] [address]  MRSA Next Gen by PCR, Nasal      Status: None   Collection Time: 01/25/23  1:15 PM   Specimen: Nasal Mucosa; Nasal Swab  Result Value Ref Range Status   MRSA by PCR Next Gen NOT DETECTED NOT DETECTED Final    Comment: (NOTE) The GeneXpert MRSA Assay (FDA approved for NASAL specimens only), is one component of a comprehensive MRSA colonization surveillance program. It is not intended to diagnose MRSA infection nor to guide or monitor treatment for MRSA infections. Test performance is not FDA approved in patients less than 4 years old. Performed at [facility], [place] [address], [place], [place] [address]          Radiology Studies: ECHOCARDIOGRAM COMPLETE  Result Date: 01/26/2023    ECHOCARDIOGRAM REPORT   Patient Name:   Janann August Date of Exam: 01/26/2023 Medical Rec #:  [id number]         Height:       68.0 in Accession #:    [id number]        Weight:       193.2 lb Date of Birth:  January 11, 1941         BSA:          2.014 m Patient Age:    83 years          BP:           144/87 mmHg Patient Gender: F                 HR:           88 bpm. Exam Location:  Inpatient Procedure: 2D Echo Indications:    CHF  History:        Patient has prior history of Echocardiogram examinations, most                 recent 11/22/2022. Risk Factors:Diabetes and Hypertension.  Sonographer:    Harvie Junior Referring Phys: [id number] Krishna Dancel V Jakarius Flamenco  Sonographer Comments: Image acquisition challenging due to respiratory motion. IMPRESSIONS  1. Left ventricular ejection fraction, by  estimation, is 60 to 65%. The left ventricle has normal function. The left ventricle has no regional wall motion abnormalities. There is mild concentric left ventricular hypertrophy. Left ventricular diastolic parameters are consistent with Grade I diastolic dysfunction (impaired relaxation).  2. Right ventricular systolic function is normal. The right ventricular size is normal. There is normal pulmonary artery systolic pressure.  3. No evidence of  mitral valve regurgitation.  4. The aortic valve is grossly normal. Aortic valve regurgitation is not visualized.  5. The inferior vena cava is normal in size with greater than 50% respiratory variability, suggesting right atrial pressure of 3 mmHg. Comparison(s): No significant change from prior study. FINDINGS  Left Ventricle: Left ventricular ejection fraction, by estimation, is 60 to 65%. The left ventricle has normal function. The left ventricle has no regional wall motion abnormalities. The left ventricular internal cavity size was normal in size. There is  mild concentric left ventricular hypertrophy. Left ventricular diastolic parameters are consistent with Grade I diastolic dysfunction (impaired relaxation). Right Ventricle: The right ventricular size is normal. Right ventricular systolic function is normal. There is normal pulmonary artery systolic pressure. The tricuspid regurgitant velocity is 2.71 m/s, and with an assumed right atrial pressure of 3 mmHg,  the estimated right ventricular systolic pressure is 02.7 mmHg. Left Atrium: Left atrial size was normal in size. Right Atrium: Right atrial size was normal in size. Pericardium: There is no evidence of pericardial effusion. Mitral Valve: No evidence of mitral valve regurgitation. Tricuspid Valve: Tricuspid valve regurgitation is mild. Aortic Valve: The aortic valve is grossly normal. Aortic valve regurgitation is not visualized. Aortic valve mean gradient measures 3.0 mmHg. Aortic valve peak gradient measures 5.6 mmHg. Aortic valve area, by VTI measures 2.78 cm. Pulmonic Valve: Pulmonic valve regurgitation is not visualized. Aorta: The aortic root and ascending aorta are structurally normal, with no evidence of dilitation. Venous: The inferior vena cava is normal in size with greater than 50% respiratory variability, suggesting right atrial pressure of 3 mmHg. IAS/Shunts: No atrial level shunt detected by color flow Doppler.  LEFT VENTRICLE PLAX 2D  LVIDd:         3.50 cm     Diastology LVIDs:         2.40 cm     LV e' medial:    5.33 cm/s LV PW:         1.10 cm     LV E/e' medial:  12.5 LV IVS:        1.30 cm     LV e' lateral:   7.29 cm/s LVOT diam:     2.00 cm     LV E/e' lateral: 9.2 LV SV:         61 LV SV Index:   30 LVOT Area:     3.14 cm                             3D Volume EF: LV Volumes (MOD)           3D EF:        55 % LV vol d, MOD A2C: 51.5 ml LV EDV:       119 ml LV vol d, MOD A4C: 71.4 ml LV ESV:       54 ml LV vol s, MOD A2C: 20.2 ml LV SV:        66 ml LV vol s, MOD A4C: 27.8 ml LV SV MOD  A2C:     31.3 ml LV SV MOD A4C:     71.4 ml LV SV MOD BP:      37.2 ml RIGHT VENTRICLE RV S prime:     17.00 cm/s TAPSE (M-mode): 1.9 cm LEFT ATRIUM           Index LA diam:      3.20 cm 1.59 cm/m LA Vol (A4C): 40.9 ml 20.31 ml/m  AORTIC VALVE                    PULMONIC VALVE AV Area (Vmax):    2.60 cm     PV Vmax:       0.81 m/s AV Area (Vmean):   2.56 cm     PV Peak grad:  2.7 mmHg AV Area (VTI):     2.78 cm AV Vmax:           118.00 cm/s AV Vmean:          81.400 cm/s AV VTI:            0.220 m AV Peak Grad:      5.6 mmHg AV Mean Grad:      3.0 mmHg LVOT Vmax:         97.70 cm/s LVOT Vmean:        66.400 cm/s LVOT VTI:          0.195 m LVOT/AV VTI ratio: 0.89  AORTA Ao Root diam: 3.40 cm Ao Asc diam:  3.50 cm MITRAL VALVE               TRICUSPID VALVE MV Area (PHT): 3.99 cm    TR Peak grad:   29.4 mmHg MV Decel Time: 190 msec    TR Vmax:        271.00 cm/s MV E velocity: 66.80 cm/s MV A velocity: 95.50 cm/s  SHUNTS MV E/A ratio:  0.70        Systemic VTI:  0.20 m                            Systemic Diam: 2.00 cm Phineas Inches Electronically signed by Phineas Inches Signature Date/Time: 01/26/2023/1:19:22 PM    Final         Scheduled Meds:  ALPRAZolam  0.5 mg Oral BID   apixaban  5 mg Oral BID   benzonatate  200 mg Oral TID   docusate sodium  100 mg Oral Daily   ferrous sulfate  325 mg Oral Daily   furosemide  20 mg Intravenous Q12H    furosemide  20 mg Intravenous Q12H   guaiFENesin  600 mg Oral BID   losartan  100 mg Oral Daily   And   hydrochlorothiazide  25 mg Oral Daily   insulin aspart  0-15 Units Subcutaneous TID WC   insulin aspart  0-5 Units Subcutaneous QHS   pantoprazole  40 mg Oral Daily   potassium chloride  40 mEq Oral Q4H   Continuous Infusions:  ceFEPime (MAXIPIME) IV 2 g (01/27/23 1011)     LOS: 2 days    Time spent: 40 minutes    Irine Seal, MD [facility]   To contact the attending provider between 7A-7P or the covering provider during after hours 7P-7A, please log into the web site [email or web address] and access using universal [place] password for that web site. If you do not have the password, please call the hospital operator.  01/27/2023, 12:13 PM

## 2023-01-27 NOTE — Progress Notes (Signed)
PT Cancellation Note  Patient Details Name: ELIF YONTS MRN: [id number] DOB: 09/09/1941   Cancelled Treatment:    Reason Eval/Treat Not Completed: PT screened, no needs identified, will sign off. RN reports  patient ambulated without support length of hall, no support.     Claretha Cooper 01/27/2023, 9:34 AM [place] Office [phone] Weekend [phone]

## 2023-01-27 NOTE — Progress Notes (Signed)
  Transition of Care [facility]) Screening Note   Patient Details  Name: Victoria [name] Date of Birth: 1941/11/18   Transition of Care [facility]) CM/SW Contact:    Vassie Moselle, LCSW Phone Number: 01/27/2023, 12:35 PM    Transition of Care Department [facility]) has reviewed patient and no TOC needs have been identified at this time. We will continue to monitor patient advancement through interdisciplinary progression rounds. If new patient transition needs arise, please place a TOC consult.

## 2023-01-28 ENCOUNTER — Other Ambulatory Visit: Payer: Self-pay

## 2023-01-28 DIAGNOSIS — R0602 Shortness of breath: Secondary | ICD-10-CM | POA: Diagnosis not present

## 2023-01-28 DIAGNOSIS — Z86718 Personal history of other venous thrombosis and embolism: Secondary | ICD-10-CM | POA: Diagnosis not present

## 2023-01-28 DIAGNOSIS — I5031 Acute diastolic (congestive) heart failure: Secondary | ICD-10-CM

## 2023-01-28 DIAGNOSIS — Z86711 Personal history of pulmonary embolism: Secondary | ICD-10-CM | POA: Diagnosis not present

## 2023-01-28 DIAGNOSIS — J189 Pneumonia, unspecified organism: Secondary | ICD-10-CM | POA: Diagnosis not present

## 2023-01-28 LAB — GLUCOSE, CAPILLARY
Glucose-Capillary: 140 mg/dL — ABNORMAL HIGH (ref 70–99)
Glucose-Capillary: 143 mg/dL — ABNORMAL HIGH (ref 70–99)
Glucose-Capillary: 177 mg/dL — ABNORMAL HIGH (ref 70–99)
Glucose-Capillary: 166 mg/dL — ABNORMAL HIGH (ref 70–99)

## 2023-01-28 LAB — CBC
HCT: 24.1 % — ABNORMAL LOW (ref 36.0–46.0)
Hemoglobin: 7.5 g/dL — ABNORMAL LOW (ref 12.0–15.0)
MCH: 27.3 pg (ref 26.0–34.0)
MCHC: 31.1 g/dL (ref 30.0–36.0)
MCV: 87.6 fL (ref 80.0–100.0)
Platelets: 226 10*3/uL (ref 150–400)
RBC: 2.75 MIL/uL — ABNORMAL LOW (ref 3.87–5.11)
RDW: 18.3 % — ABNORMAL HIGH (ref 11.5–15.5)
WBC: 7.6 10*3/uL (ref 4.0–10.5)
nRBC: 0.3 % — ABNORMAL HIGH (ref 0.0–0.2)

## 2023-01-28 LAB — BASIC METABOLIC PANEL
Anion gap: 11 (ref 5–15)
BUN: 16 mg/dL (ref 8–23)
CO2: 26 mmol/L (ref 22–32)
Calcium: 8.6 mg/dL — ABNORMAL LOW (ref 8.9–10.3)
Chloride: 98 mmol/L (ref 98–111)
Creatinine, Ser: 0.89 mg/dL (ref 0.44–1.00)
GFR, Estimated: >60 mL/min (ref >60)
Glucose, Bld: 135 mg/dL — ABNORMAL HIGH (ref 70–99)
Potassium: 3.6 mmol/L (ref 3.5–5.1)
Sodium: 135 mmol/L (ref 135–145)

## 2023-01-28 LAB — HEMOGLOBIN AND HEMATOCRIT, BLOOD
HCT: 27.9 % — ABNORMAL LOW (ref 36.0–46.0)
Hemoglobin: 8.8 g/dL — ABNORMAL LOW (ref 12.0–15.0)

## 2023-01-28 LAB — MAGNESIUM: Magnesium: 2 mg/dL (ref 1.7–2.4)

## 2023-01-28 LAB — PREPARE RBC (CROSSMATCH)

## 2023-01-28 MED ORDER — POLYETHYLENE GLYCOL 3350 17 G PO PACK
17.0000 g | PACK | Freq: Every day | ORAL | Status: DC
  Administered 2023-01-28 – 2023-02-04 (×8): 17 g via ORAL
  Filled 2023-01-28 (×9): qty 1

## 2023-01-28 MED ORDER — SODIUM CHLORIDE 0.9% IV SOLUTION: Freq: Once | INTRAVENOUS | Status: AC

## 2023-01-28 MED ORDER — FUROSEMIDE 20 MG PO TABS
20.0000 mg | ORAL_TABLET | Freq: Every day | ORAL | Status: DC
  Administered 2023-01-29 – 2023-02-03 (×6): 20 mg via ORAL
  Filled 2023-01-28 (×6): qty 1

## 2023-01-28 MED ORDER — POTASSIUM CHLORIDE CRYS ER 20 MEQ PO TBCR
40.0000 meq | EXTENDED_RELEASE_TABLET | Freq: Once | ORAL | Status: AC
  Administered 2023-01-28: 40 meq via ORAL
  Filled 2023-01-28: qty 2

## 2023-01-28 MED ORDER — FUROSEMIDE 10 MG/ML IJ SOLN
20.0000 mg | Freq: Once | INTRAMUSCULAR | Status: AC
  Administered 2023-01-28: 20 mg via INTRAVENOUS
  Filled 2023-01-28: qty 2

## 2023-01-28 MED ORDER — ACETAMINOPHEN 325 MG PO TABS
650.0000 mg | ORAL_TABLET | Freq: Once | ORAL | Status: AC
  Administered 2023-01-28: 650 mg via ORAL
  Filled 2023-01-28: qty 2

## 2023-01-28 MED ORDER — DIPHENHYDRAMINE HCL 25 MG PO CAPS
25.0000 mg | ORAL_CAPSULE | Freq: Once | ORAL | Status: AC
  Administered 2023-01-28: 25 mg via ORAL
  Filled 2023-01-28: qty 1

## 2023-01-28 MED ORDER — SENNOSIDES-DOCUSATE SODIUM 8.6-50 MG PO TABS
1.0000 | ORAL_TABLET | Freq: Two times a day (BID) | ORAL | Status: DC
  Administered 2023-01-28 – 2023-02-04 (×15): 1 via ORAL
  Filled 2023-01-28 (×15): qty 1

## 2023-01-28 MED ORDER — BISACODYL 5 MG PO TBEC
10.0000 mg | DELAYED_RELEASE_TABLET | Freq: Once | ORAL | Status: AC
  Administered 2023-01-28: 10 mg via ORAL
  Filled 2023-01-28: qty 2

## 2023-01-28 MED ORDER — BISACODYL 5 MG PO TBEC: 5.0000 mg | DELAYED_RELEASE_TABLET | Freq: Once | ORAL | Status: DC

## 2023-01-28 MED ORDER — CEFDINIR 300 MG PO CAPS
300.0000 mg | ORAL_CAPSULE | Freq: Two times a day (BID) | ORAL | Status: AC
  Administered 2023-01-28 – 2023-01-29 (×4): 300 mg via ORAL
  Filled 2023-01-28 (×4): qty 1

## 2023-01-28 NOTE — Plan of Care (Signed)

## 2023-01-28 NOTE — Progress Notes (Signed)
PROGRESS NOTE    Victoria [name]  XNA:[id number] DOB: [birth year] DOA: 01/24/2023 PCP: Elby Showers, MD    Chief Complaint  Patient presents with   Shortness of Breath    Brief Narrative:  HPI per Dr. Kandis Nab is a 82 y.o. female with medical history significant of PE/DVT, DM2, pancreatic CA, HTN. Presenting with weakness and shortness of breath. She reports that she had been doing well since her recent discharge from the hospital. However, 2 days ago, she noticed that she was having productive cough and weakness. She didn't have any fever, N/V/D, sick contacts. She took some OTC meds and prescribed cough meds. However, they didn't help. This morning, her weakness worsened so she decided to come to the ED for evaluation. She denies any other aggravating or alleviating factors.     Assessment & Plan:   Principal Problem:   Multifocal pneumonia Active Problems:   DM2 (diabetes mellitus, type 2) ([place])   Hypertension   Pancreatic cancer ([place])   History of DVT (deep vein thrombosis)   History of pulmonary embolism   Normocytic anemia   Shortness of breath   Acute diastolic CHF (congestive heart failure) (H[facility])  #1 shortness of breath/??  Multifocal pneumonia versus acute diastolic CHF exacerbation. -Patient presented with generalized weakness, shortness of breath. -Patient noted to have recently hospitalized and treated for a atypical pneumonia with 5 days of IV Rocephin and IV azithromycin. -BNP within normal limits. -Procalcitonin negative. -MRSA PCR negative. -Blood cultures pending with no growth to date x 4 days. -Respiratory viral panel negative. -SARS coronavirus PCR negative, influenza A and B PCR negative, respiratory syncytial virus negative. -Discontinued IV vancomycin and continue IV cefepime.   -2D echo with EF 60 to 65%, NWMA, grade 1 diastolic dysfunction.   -Patient started on Lasix 20 mg IV every 12 hours with urine output of 2 L over the past 24  hours.   -Continue Lasix 20 mg IV every 12 hours x 2 more doses and transition to oral Lasix 20 mg tomorrow. -Discontinue IV cefepime and transition to [facility] for 2 more days to complete an empiric 5-day course of treatment..   -Strict I's and O's, daily weights.  2.  Acute diastolic CHF exacerbation/volume overload -Patient with shortness of breath, initial concern was for multifocal pneumonia. -Patient placed on IV Lasix with good urine output and clinical improvement with diuresis. -2D echo  with EF 60 to 65%, NWMA, grade 1 diastolic dysfunction. -Continue Lasix 20 mg IV every 12 hours through today and transition to oral Lasix 20 mg daily tomorrow. -May benefit from outpatient follow-up with cardiology.  3.  Hypertension -Continue home regimen Cozaar, HCTZ. -Continue Lasix 20 mg IV every 12 hours for another 2 doses and likely transition to oral Lasix tomorrow.   -May consider discontinuation of HCTZ on discharge if patient is to be discharged on oral Lasix.   4.  History of DVT/PE -Continue Eliquis  5.  Diabetes mellitus type 2 -Hemoglobin A1c 6.7 (12/06/2022) -CBG 140 this morning.   -Continue to hold oral hypoglycemic agents.   -SSI.   6.  Normocytic anemia -Anemia panel consistent with anemia of chronic disease. -Hemoglobin stable at 7.5 today. -Patient was complains of some generalized weakness, denies any overt bleeding. -Due to current presentation with shortness of breath, concern for volume overload will transfuse 1 unit packed red blood cells. -Follow H&H. -Transfusion threshold hemoglobin < 8.  7.  History of pancreatic cancer -Outpatient follow-up with  oncology. -Oncology notified of admission via epic.  8.  Hypokalemia -Secondary to diuresis.  -Potassium at 3.6 this morning.  -Magnesium at 2.0.  -K-Dur 40 mEq p.o. x 1.      DVT prophylaxis: Eliquis Code Status: Full Family Communication: Updated patient.  No family at bedside. Disposition: Likely  home when clinically improved and at baseline hopefully in the next 1 to 2 days.  Status is: Inpatient Remains inpatient appropriate because: Severity of illness.   Consultants:  None  Procedures:  Chest x-ray 01/24/2023 2D echo 01/26/2023 Transfuse 1 unit packed red blood cells 01/28/2023    Antimicrobials:  IV cefepime 01/24/2023>>>>> 01/28/2023 IV vancomycin 01/24/2023>>>> 01/26/2023 Omnicef 01/28/2023>>>> 01/30/2023   Subjective: Laying in bed.  Shortness of breath continues to improve on a daily basis per patient as well as cough, not yet at baseline.  Having good urine output on IV Lasix.  Concerned about her anemia.  Some complaints of weakness.  Appreciative of the care she is receiving.    Objective: Vitals:   01/28/23 0443 01/28/23 1225 01/28/23 1515 01/28/23 1554  BP: 119/65 (!) 125/98 105/84 110/63  Pulse: 91 94 99 89  Resp: '17 18 18 18  '$ Temp: 97.8 F (36.6 C) 98.2 F (36.8 C) 98 F (36.7 C) 98.2 F (36.8 C)  TempSrc: Oral Oral Oral Oral  SpO2: 96% 96% 98% 96%  Weight:      Height:        Intake/Output Summary (Last 24 hours) at 01/28/2023 1720 Last data filed at 01/28/2023 1637 Gross per 24 hour  Intake 372.67 ml  Output 1625 ml  Net -1252.33 ml   Filed Weights   01/24/23 1514 01/26/23 0500 01/27/23 0500  Weight: 88.9 kg 87.6 kg 84.8 kg    Examination:  General exam: NAD Respiratory system: Some decreased breath sounds in the bases.  Less tight.  Improved diffuse coarse breath sounds/crackles.  No significant expiratory wheezing noted.  Speaking in full sentences.  No use of accessory muscles of respiration.   Cardiovascular system: Regular rate rhythm no murmurs rubs or gallops.  No JVD.  No lower extremity edema.  Gastrointestinal system: Abdomen is soft, nontender, nondistended, positive bowel sounds.  No rebound.  No guarding.  Central nervous system: Alert and oriented.  Moving extremities spontaneously.  No focal neurological deficits.  Extremities: Symmetric  5 x 5 power. Skin: No rashes, lesions or ulcers Psychiatry: Judgement and insight appear normal. Mood & affect appropriate.     Data Reviewed: I have personally reviewed following labs and imaging studies  CBC: Recent Labs  Lab 01/24/23 1045 01/25/23 0559 01/26/23 0542 01/27/23 0917 01/28/23 0517  WBC 8.3 6.8 7.0 8.3 7.6  NEUTROABS 6.4  --  4.8  --   --   HGB 8.0* 7.4* 7.4* 8.0* 7.5*  HCT 25.4* 23.4* 23.7* 25.8* 24.1*  MCV 88.2 87.6 88.4 89.9 87.6  PLT 210 191 200 258 349    Basic Metabolic Panel: Recent Labs  Lab 01/24/23 1045 01/25/23 0559 01/26/23 0542 01/27/23 0509 01/27/23 0917 01/28/23 0517  NA 134* 137 136  --  136 135  K 3.6 3.6 3.3*  --  3.2* 3.6  CL 100 105 101  --  97* 98  CO2 '25 23 24  '$ --  24 26  GLUCOSE 189* 129* 144*  --  160* 135*  BUN '11 9 12  '$ --  12 16  CREATININE 0.89 0.83 1.03*  --  0.91 0.89  CALCIUM 8.2* 8.1* 8.3*  --  8.7* 8.6*  MG  --   --  1.6* 2.1  --  2.0    GFR: Estimated Creatinine Clearance: 58.5 mL/min (by C-G formula based on SCr of 0.89 mg/dL).  Liver Function Tests: Recent Labs  Lab 01/24/23 1045 01/25/23 0559  AST 31 26  ALT 23 20  ALKPHOS 130* 105  BILITOT 0.9 0.6  PROT 6.9 6.5  ALBUMIN 3.0* 2.6*    CBG: Recent Labs  Lab 01/27/23 1653 01/27/23 2120 01/28/23 0742 01/28/23 1152 01/28/23 1627  GLUCAP 92 141* 140* 143* 177*     Recent Results (from the past 240 hour(s))  Resp panel by RT-PCR (RSV, Flu A&B, Covid) Anterior Nasal Swab     Status: None   Collection Time: 01/24/23 10:15 AM   Specimen: Anterior Nasal Swab  Result Value Ref Range Status   SARS Coronavirus 2 by RT PCR NEGATIVE NEGATIVE Final    Comment: (NOTE) SARS-CoV-2 target nucleic acids are NOT DETECTED.  The SARS-CoV-2 RNA is generally detectable in upper respiratory specimens during the acute phase of infection. The lowest concentration of SARS-CoV-2 viral copies this assay can detect is 138 copies/mL. A negative result does not  preclude SARS-Cov-2 infection and should not be used as the sole basis for treatment or other patient management decisions. A negative result may occur with  improper specimen collection/handling, submission of specimen other than nasopharyngeal swab, presence of viral mutation(s) within the areas targeted by this assay, and inadequate number of viral copies(<138 copies/mL). A negative result must be combined with clinical observations, patient history, and epidemiological information. The expected result is Negative.  Fact Sheet for Patients:  EntrepreneurPulse.com.au  Fact Sheet for Healthcare Providers:  IncredibleEmployment.be  This test is no t yet approved or cleared by the [place] FDA and  has been authorized for detection and/or diagnosis of SARS-CoV-2 by FDA under an Emergency Use Authorization (EUA). This EUA will remain  in effect (meaning this test can be used) for the duration of the COVID-19 declaration under Section 564(b)(1) of the Act, 21 U.S.C.section 360bbb-3(b)(1), unless the authorization is terminated  or revoked sooner.       Influenza A by PCR NEGATIVE NEGATIVE Final   Influenza B by PCR NEGATIVE NEGATIVE Final    Comment: (NOTE) The Xpert Xpress SARS-CoV-2/FLU/RSV plus assay is intended as an aid in the diagnosis of influenza from Nasopharyngeal swab specimens and should not be used as a sole basis for treatment. Nasal washings and aspirates are unacceptable for Xpert Xpress SARS-CoV-2/FLU/RSV testing.  Fact Sheet for Patients: EntrepreneurPulse.com.au  Fact Sheet for Healthcare Providers: IncredibleEmployment.be  This test is not yet approved or cleared by the [place] FDA and has been authorized for detection and/or diagnosis of SARS-CoV-2 by FDA under an Emergency Use Authorization (EUA). This EUA will remain in effect (meaning this test can be used) for the  duration of the COVID-19 declaration under Section 564(b)(1) of the Act, 21 U.S.C. section 360bbb-3(b)(1), unless the authorization is terminated or revoked.     Resp Syncytial Virus by PCR NEGATIVE NEGATIVE Final    Comment: (NOTE) Fact Sheet for Patients: EntrepreneurPulse.com.au  Fact Sheet for Healthcare Providers: IncredibleEmployment.be  This test is not yet approved or cleared by the [place] FDA and has been authorized for detection and/or diagnosis of SARS-CoV-2 by FDA under an Emergency Use Authorization (EUA). This EUA will remain in effect (meaning this test can be used) for the duration of the COVID-19 declaration under Section 564(b)(1) of the Act,  21 U.S.C. section 360bbb-3(b)(1), unless the authorization is terminated or revoked.  Performed at [facility], [place] [address], [place], [place] [address]   Blood culture (routine x 2)     Status: None (Preliminary result)   Collection Time: 01/24/23 11:17 AM   Specimen: BLOOD LEFT ARM  Result Value Ref Range Status   Specimen Description   Final    BLOOD LEFT ARM BOTTLES DRAWN AEROBIC AND ANAEROBIC Performed at [place] [address], [place], [place] [address]    Special Requests   Final    Blood Culture adequate volume Performed at [place] [address], [place], [place] [address]    Culture   Final    NO GROWTH 4 DAYS Performed at [facility], [place] [address] [place], [place] [address]    Report Status PENDING  Incomplete  Blood culture (routine x 2)     Status: None (Preliminary result)   Collection Time: 01/24/23 11:25 AM   Specimen: BLOOD LEFT HAND  Result Value Ref Range Status   Specimen Description BLOOD LEFT HAND  Final   Special Requests   Final    BOTTLES DRAWN AEROBIC AND ANAEROBIC Blood Culture adequate volume   Culture   Final    NO GROWTH 4 DAYS Performed at [facility], [place] [address] [place], [place] [address]    Report Status PENDING  Incomplete  Respiratory (~20 pathogens) panel by PCR     Status: None   Collection Time: 01/24/23 12:33 PM   Specimen: Nasopharyngeal Swab; Respiratory  Result Value Ref Range Status   Adenovirus NOT DETECTED NOT DETECTED Final   Coronavirus 229E NOT DETECTED NOT DETECTED Final    Comment: (NOTE) The Coronavirus on the Respiratory Panel, DOES NOT test for the novel  Coronavirus (2019 nCoV)    Coronavirus HKU1 NOT DETECTED NOT DETECTED Final   Coronavirus NL63 NOT DETECTED NOT DETECTED Final   Coronavirus OC43 NOT DETECTED NOT DETECTED Final   Metapneumovirus NOT DETECTED NOT DETECTED Final   Rhinovirus / Enterovirus NOT DETECTED NOT DETECTED Final   Influenza A NOT DETECTED NOT DETECTED Final   Influenza B NOT DETECTED NOT DETECTED Final   Parainfluenza Virus 1 NOT DETECTED NOT DETECTED Final   Parainfluenza Virus 2 NOT DETECTED NOT DETECTED Final   Parainfluenza Virus 3 NOT DETECTED NOT DETECTED Final   Parainfluenza Virus 4 NOT DETECTED NOT DETECTED Final   Respiratory Syncytial Virus NOT DETECTED NOT DETECTED Final   Bordetella pertussis NOT DETECTED NOT DETECTED Final   Bordetella Parapertussis NOT DETECTED NOT DETECTED Final   Chlamydophila pneumoniae NOT DETECTED NOT DETECTED Final   Mycoplasma pneumoniae NOT DETECTED NOT DETECTED Final    Comment: Performed at [facility], [place]. [address], [place], [place] [address]  MRSA Next Gen by PCR, Nasal     Status: None   Collection Time: 01/25/23  1:15 PM   Specimen: Nasal Mucosa; Nasal Swab  Result Value Ref Range Status   MRSA by PCR Next Gen NOT DETECTED NOT DETECTED Final    Comment: (NOTE) The GeneXpert MRSA Assay (FDA approved for NASAL specimens only), is one component of a comprehensive MRSA colonization surveillance program. It is not intended to diagnose MRSA infection nor to guide or monitor treatment for MRSA infections. Test  performance is not FDA approved in patients less than 95 years old. Performed at [facility], [place] [address] [place], [place]  [place]          Radiology Studies: No results found.      Scheduled Meds:  ALPRAZolam  0.5 mg Oral BID   apixaban  5 mg Oral BID   benzonatate  200 mg Oral TID   cefdinir  300 mg Oral Q12H   ferrous sulfate  325 mg Oral Daily   furosemide  20 mg Intravenous Q12H   furosemide  20 mg Intravenous Once   guaiFENesin  600 mg Oral BID   losartan  100 mg Oral Daily   And   hydrochlorothiazide  25 mg Oral Daily   insulin aspart  0-15 Units Subcutaneous TID WC   insulin aspart  0-5 Units Subcutaneous QHS   pantoprazole  40 mg Oral Daily   polyethylene glycol  17 g Oral Daily   senna-docusate  1 tablet Oral BID   Continuous Infusions:     LOS: 3 days    Time spent: 40 minutes    Irine Seal, MD [facility]   To contact the attending provider between 7A-7P or the covering provider during after hours 7P-7A, please log into the web site [email or web address] and access using universal [place] password for that web site. If you do not have the password, please call the hospital operator.  01/28/2023, 5:20 PM

## 2023-01-28 NOTE — Progress Notes (Signed)
OT Cancellation Note  Patient Details Name: Victoria [name] MRN: [id number] DOB: 06/03/1941   Cancelled Treatment:    Reason Eval/Treat Not Completed: OT screened, no needs identified, will sign off Patient is independent in ADLs at this time. Patient endorsed not needing OT services. OT signing off at this time.  Rennie Plowman, [place] [facility] Department Office# [phone]  01/28/2023, 7:58 AM

## 2023-01-29 ENCOUNTER — Inpatient Hospital Stay (HOSPITAL_COMMUNITY): Payer: Medicare HMO

## 2023-01-29 DIAGNOSIS — E119 Type 2 diabetes mellitus without complications: Secondary | ICD-10-CM | POA: Diagnosis not present

## 2023-01-29 DIAGNOSIS — J189 Pneumonia, unspecified organism: Secondary | ICD-10-CM | POA: Diagnosis not present

## 2023-01-29 DIAGNOSIS — I5031 Acute diastolic (congestive) heart failure: Secondary | ICD-10-CM | POA: Diagnosis not present

## 2023-01-29 DIAGNOSIS — R0602 Shortness of breath: Secondary | ICD-10-CM | POA: Diagnosis not present

## 2023-01-29 LAB — COMPREHENSIVE METABOLIC PANEL
ALT: 33 U/L (ref 0–44)
AST: 47 U/L — ABNORMAL HIGH (ref 15–41)
Albumin: 2.9 g/dL — ABNORMAL LOW (ref 3.5–5.0)
Alkaline Phosphatase: 145 U/L — ABNORMAL HIGH (ref 38–126)
Anion gap: 12 (ref 5–15)
BUN: 22 mg/dL (ref 8–23)
CO2: 26 mmol/L (ref 22–32)
Calcium: 8.8 mg/dL — ABNORMAL LOW (ref 8.9–10.3)
Chloride: 98 mmol/L (ref 98–111)
Creatinine, Ser: 0.98 mg/dL (ref 0.44–1.00)
GFR, Estimated: 59 mL/min — ABNORMAL LOW (ref >60)
Glucose, Bld: 149 mg/dL — ABNORMAL HIGH (ref 70–99)
Potassium: 3.7 mmol/L (ref 3.5–5.1)
Sodium: 136 mmol/L (ref 135–145)
Total Bilirubin: 1 mg/dL (ref 0.3–1.2)
Total Protein: 6.8 g/dL (ref 6.5–8.1)

## 2023-01-29 LAB — CBC WITH DIFFERENTIAL/PLATELET
Abs Immature Granulocytes: 0.03 10*3/uL (ref 0.00–0.07)
Basophils Absolute: 0 10*3/uL (ref 0.0–0.1)
Basophils Relative: 1 %
Eosinophils Absolute: 0.2 10*3/uL (ref 0.0–0.5)
Eosinophils Relative: 2 %
HCT: 29.2 % — ABNORMAL LOW (ref 36.0–46.0)
Hemoglobin: 9.3 g/dL — ABNORMAL LOW (ref 12.0–15.0)
Immature Granulocytes: 0 %
Lymphocytes Relative: 14 %
Lymphs Abs: 1.2 10*3/uL (ref 0.7–4.0)
MCH: 28.3 pg (ref 26.0–34.0)
MCHC: 31.8 g/dL (ref 30.0–36.0)
MCV: 88.8 fL (ref 80.0–100.0)
Monocytes Absolute: 0.9 10*3/uL (ref 0.1–1.0)
Monocytes Relative: 11 %
Neutro Abs: 6.4 10*3/uL (ref 1.7–7.7)
Neutrophils Relative %: 72 %
Platelets: 239 10*3/uL (ref 150–400)
RBC: 3.29 MIL/uL — ABNORMAL LOW (ref 3.87–5.11)
RDW: 17.7 % — ABNORMAL HIGH (ref 11.5–15.5)
WBC: 8.8 10*3/uL (ref 4.0–10.5)
nRBC: 0.2 % (ref 0.0–0.2)

## 2023-01-29 LAB — CULTURE, BLOOD (ROUTINE X 2)
Culture: NO GROWTH
Culture: NO GROWTH
Special Requests: ADEQUATE
Special Requests: ADEQUATE

## 2023-01-29 LAB — PHOSPHORUS: Phosphorus: 3.9 mg/dL (ref 2.5–4.6)

## 2023-01-29 LAB — TYPE AND SCREEN
ABO/RH(D): O POS
Antibody Screen: NEGATIVE
Unit division: 0

## 2023-01-29 LAB — GLUCOSE, CAPILLARY
Glucose-Capillary: 148 mg/dL — ABNORMAL HIGH (ref 70–99)
Glucose-Capillary: 195 mg/dL — ABNORMAL HIGH (ref 70–99)
Glucose-Capillary: 161 mg/dL — ABNORMAL HIGH (ref 70–99)
Glucose-Capillary: 136 mg/dL — ABNORMAL HIGH (ref 70–99)

## 2023-01-29 LAB — BPAM RBC
Blood Product Expiration Date: 202403062359
ISSUE DATE / TIME: 202402061532
Unit Type and Rh: 5100

## 2023-01-29 LAB — MAGNESIUM: Magnesium: 1.9 mg/dL (ref 1.7–2.4)

## 2023-01-29 MED ORDER — POTASSIUM CHLORIDE CRYS ER 20 MEQ PO TBCR
40.0000 meq | EXTENDED_RELEASE_TABLET | Freq: Once | ORAL | Status: AC
  Administered 2023-01-29: 40 meq via ORAL
  Filled 2023-01-29: qty 2

## 2023-01-29 MED ORDER — FUROSEMIDE 10 MG/ML IJ SOLN
20.0000 mg | Freq: Once | INTRAMUSCULAR | Status: AC
  Administered 2023-01-29: 20 mg via INTRAVENOUS
  Filled 2023-01-29: qty 2

## 2023-01-29 NOTE — Plan of Care (Signed)

## 2023-01-29 NOTE — Consult Note (Signed)
   [facility] Inpatient Consult   01/29/2023  GWYNN CHALKER 09-Mar-1941 [id number]  [place] [facility] [ACO] Patient: Victoria [name] Hospital Liaison remote coverage review for patient admitted to [facility]    Primary Care Provider:  Elby Showers, MD is listed to provide the  Patient screened for less than 7 days readmission hospitalization with noted  high risk score for unplanned readmission risk.  Patient's electronic medical record to assess for potential [place] [facility] service needs for post hospital transition for care coordination.  Review of patient's electronic medical record reveals patient is admitted with ongoing shortness of breath. Also, patient recently with a [place] Coordination out reach and agreed to services however readmitted.   Plan:  Continue to follow progress and disposition to assess for post hospital community care coordination/management needs.  Referral request for community care coordination: will update [facility] CMA of new admission for post hospital f/u with team.  Of note, [place] does not replace or interfere with any arrangements made by the Inpatient Transition of Care team.  For questions contact:   Natividad Brood, RN BSN [place]  [phone] business mobile phone Toll free office [phone]  *[place]  [phone] Fax number: [phone] [email or web address]

## 2023-01-29 NOTE — Care Management Important Message (Signed)
Important Message  Patient Details IM Letter given. Name: Victoria [name] MRN: [id number] Date of Birth: Apr 23, 1941   Medicare Important Message Given:  Yes     Kerin Salen 01/29/2023, 11:31 AM

## 2023-01-29 NOTE — Progress Notes (Signed)
PROGRESS NOTE    Victoria [name]  SWH:[id number] DOB: Feb 28, 1941 DOA: 01/24/2023 PCP: Elby Showers, MD   Brief Narrative:  The patient is a 82 year old overweight African-American female with a past medical history significant for but not limited to history of PE and DVT on anticoagulation, diabetes mellitus type 2, pancreatic cancer, hypertension as well as other comorbidities who presented with weakness and shortness of breath.  She reports that she had been doing well since her discharge from the hospital for community-acquired pneumonia however over the last 2 days she notes that she had a productive cough and weakness.  She did not have any fever, nausea, vomiting, diarrhea or sick contacts.  She took some over-the-counter medications prescribed for her cough however did did not help.  Her weakness worsened so she decided come to the ED for further evaluation.  Her initial chest x-ray showed a patchy bibasilar airspace opacity with a multifocal pneumonia versus asymmetrical pulmonary edema noted and so she was given treatment with IV antibiotics and started on diuresis.  She started improving with diuresis and her antibiotics were changed to oral medications.  Assessment and Plan:  Dyspnea 2/2 Multifocal pneumonia and likely concomitant Acute Diastolic CHF Exacerbation. -Patient presented with generalized weakness, shortness of breath. -Patient noted to have recently hospitalized and treated for a atypical pneumonia with 5 days of IV Rocephin and IV azithromycin. -BNP within normal limits. -Procalcitonin negative. -MRSA PCR negative. -Blood cultures pending with no growth to date x 5 Days. -SpO2: 96 % -Respiratory viral panel negative. -SARS coronavirus PCR negative, influenza A and B PCR negative, respiratory syncytial virus negative. -Discontinued IV Vancomycin and Continue IV Cefepime.   -2D echo with EF 60 to 65%, NWMA, grade 1 diastolic dysfunction.   -Patient started on Lasix 20  mg IV every 12 hours and was Continued on Lasix 20 mg IV every 12 hours yesterday and  transitioned to oral Lasix 20 mg today and given an additional dose of IV Lasix 20 mg x1 today  -Discontinue IV cefepime and transition to [facility] for 2 more days to complete an empiric 5-day course of treatment.Marland Kitchen   -C/w Guaifenesin 600 mg po BID -Strict I's and O's, daily weights. -Repeat CXR this AM done and showed "Normal cardiac silhouette. Patchy bilateral airspace opacities appears slightly improved. No focal consolidation. No pneumothorax. No acute osseous abnormality." -Continue to monitor respiratory status carefully and will need an ambulatory home O2 screen prior to discharge  Acute Diastolic CHF Exacerbation/Volume Overload -Patient had shortness of breath, initial concern was for multifocal pneumonia. -Patient placed on IV Lasix with good urine output and clinical improvement with diuresis. -2D echo  with EF 60 to 65%, NWMA, grade 1 diastolic dysfunction. -Continued Lasix 20 mg IV every 12 hours through yesterday and transitioned to oral Lasix 20 mg daily today with an additional IV dose of Lasix 20 mg x1 today  -Strict I's and O's and Daily Weights; Patient is -9 lbs since 01/24/23 and is -2.708 Liters since Admission  -DG Chest X-Ray done and showed "Normal cardiac silhouette. Patchy bilateral airspace opacities appears slightly improved. No focal consolidation. No pneumothorax. No acute osseous abnormality." -May benefit from outpatient follow-up with Cardiology.   Hypertension -Continue home regimen of Losartan but will discontinue HCTZ. -Continued Lasix 20 mg IV every 12 hours for another 2 doses yesterday and will give an additional 1x dose this AM and transition to oral Furosemide 20 mg Daily as well -Will purse discontinuation of HCTZ since  the patient is being transitioned to oral Furosemide -Continue to Monitor BP per Protocol -C/w Hydralazine 10 mg IV q8hprn for HBP  -Last BP Reading  was 129/70   History of DVT/PE -Continue Anticoagulation with Apixaban 5 mg po BID   Diabetes Mellitus Type 2 -Hemoglobin A1c 6.7 (12/06/2022) -Continue to hold oral hypoglycemic agents.   -Continue Moderate Novolog SSI AC/HS. -CBG's ranging from  140-195   Normocytic Anemia -Anemia panel consistent with anemia of chronic disease and showed an iron level of 87, UIBC of 308, TIBC 395, saturation ratios of 22% -Patient was complains of some generalized weakness, denies any overt bleeding. -C/w Ferrous Sulfate tab 325 mg po Daily  -Due to current presentation with shortness of breath, concern for volume overload she was transfused 1 unit of PRBC -Hgb/Hct Trend: Recent Labs  Lab 01/24/23 1045 01/25/23 0559 01/26/23 0542 01/27/23 0917 01/28/23 0517 01/28/23 2030 01/29/23 0919  HGB 8.0* 7.4* 7.4* 8.0* 7.5* 8.8* 9.3*  HCT 25.4* 23.4* 23.7* 25.8* 24.1* 27.9* 29.2*  MCV 88.2 87.6 88.4 89.9 87.6  --  88.8  -Follow H&H. -Transfusion threshold hemoglobin < 8. -Continue to monitor for signs and symptoms of bleeding; no overt bleeding noted -Repeat CBC in a.m.   History of Pancreatic Cancer -Outpatient follow-up with Oncology. -Oncology Dr. Burr Medico notified of admission via epic.   Hypokalemia, improved  -Patient's K+ Level Trend: Recent Labs  Lab 01/16/23 0442 01/24/23 1045 01/25/23 0559 01/26/23 0542 01/27/23 0917 01/28/23 0517 01/29/23 0919  K 4.1 3.6 3.6 3.3* 3.2* 3.6 3.7  -Replete with po Kcl 40 mEQ x1 given continued Diuresis -Mag Level was 1.9 -Continue to Monitor and Replete as Necessary -Repeat CMP in the AM   Hypoalbuminemia -Patient's Albumin Trend: Recent Labs  Lab 01/13/23 2134 01/15/23 0446 01/16/23 0442 01/24/23 1045 01/25/23 0559 01/29/23 0919  ALBUMIN 3.5 2.7* 2.9* 3.0* 2.6* 2.9*  -Continue to Monitor and Trend and repeat CMP in the AM   Overweight -Complicates overall prognosis and care -Estimated body mass index is 28.43 kg/m as calculated from  the following:   Height as of this encounter: '5\' 8"'$  (1.727 m).   Weight as of this encounter: 84.8 kg.  -Weight Loss and Dietary Counseling given  GERD/GI Prophylaxis -C/w Pantoprazole 40 mg po Daily   DVT prophylaxis:  apixaban (ELIQUIS) tablet 5 mg    Code Status: Full Code Family Communication: No family present at bedside   Disposition Plan:  Level of care: Telemetry Status is: Inpatient Remains inpatient appropriate because: His further clinical improvement and an ambulatory home O2 screen prior to discharge.   Consultants:  None; Oncology was notified as a courtesy   Procedures:  ECHOCARDIOGRAM IMPRESSIONS     1. Left ventricular ejection fraction, by estimation, is 60 to 65%. The  left ventricle has normal function. The left ventricle has no regional  wall motion abnormalities. There is mild concentric left ventricular  hypertrophy. Left ventricular diastolic  parameters are consistent with Grade I diastolic dysfunction (impaired  relaxation).   2. Right ventricular systolic function is normal. The right ventricular  size is normal. There is normal pulmonary artery systolic pressure.   3. No evidence of mitral valve regurgitation.   4. The aortic valve is grossly normal. Aortic valve regurgitation is not  visualized.   5. The inferior vena cava is normal in size with greater than 50%  respiratory variability, suggesting right atrial pressure of 3 mmHg.   Comparison(s): No significant change from prior study.  FINDINGS   Left Ventricle: Left ventricular ejection fraction, by estimation, is 60  to 65%. The left ventricle has normal function. The left ventricle has no  regional wall motion abnormalities. The left ventricular internal cavity  size was normal in size. There is   mild concentric left ventricular hypertrophy. Left ventricular diastolic  parameters are consistent with Grade I diastolic dysfunction (impaired  relaxation).   Right Ventricle: The right  ventricular size is normal. Right ventricular  systolic function is normal. There is normal pulmonary artery systolic  pressure. The tricuspid regurgitant velocity is 2.71 m/s, and with an  assumed right atrial pressure of 3 mmHg,   the estimated right ventricular systolic pressure is 30.0 mmHg.   Left Atrium: Left atrial size was normal in size.   Right Atrium: Right atrial size was normal in size.   Pericardium: There is no evidence of pericardial effusion.   Mitral Valve: No evidence of mitral valve regurgitation.   Tricuspid Valve: Tricuspid valve regurgitation is mild.   Aortic Valve: The aortic valve is grossly normal. Aortic valve  regurgitation is not visualized. Aortic valve mean gradient measures 3.0  mmHg. Aortic valve peak gradient measures 5.6 mmHg. Aortic valve area, by  VTI measures 2.78 cm.   Pulmonic Valve: Pulmonic valve regurgitation is not visualized.   Aorta: The aortic root and ascending aorta are structurally normal, with  no evidence of dilitation.   Venous: The inferior vena cava is normal in size with greater than 50%  respiratory variability, suggesting right atrial pressure of 3 mmHg.   IAS/Shunts: No atrial level shunt detected by color flow Doppler.    LEFT VENTRICLE  PLAX 2D  LVIDd:         3.50 cm     Diastology  LVIDs:         2.40 cm     LV e' medial:    5.33 cm/s  LV PW:         1.10 cm     LV E/e' medial:  12.5  LV IVS:        1.30 cm     LV e' lateral:   7.29 cm/s  LVOT diam:     2.00 cm     LV E/e' lateral: 9.2  LV SV:         61  LV SV Index:   30  LVOT Area:     3.14 cm                               3D Volume EF:  LV Volumes (MOD)           3D EF:        55 %  LV vol d, MOD A2C: 51.5 ml LV EDV:       119 ml  LV vol d, MOD A4C: 71.4 ml LV ESV:       54 ml  LV vol s, MOD A2C: 20.2 ml LV SV:        66 ml  LV vol s, MOD A4C: 27.8 ml  LV SV MOD A2C:     31.3 ml  LV SV MOD A4C:     71.4 ml  LV SV MOD BP:      37.2 ml   RIGHT  VENTRICLE  RV S prime:     17.00 cm/s  TAPSE (M-mode): 1.9 cm   LEFT ATRIUM  Index  LA diam:      3.20 cm 1.59 cm/m  LA Vol (A4C): 40.9 ml 20.31 ml/m   AORTIC VALVE                    PULMONIC VALVE  AV Area (Vmax):    2.60 cm     PV Vmax:       0.81 m/s  AV Area (Vmean):   2.56 cm     PV Peak grad:  2.7 mmHg  AV Area (VTI):     2.78 cm  AV Vmax:           118.00 cm/s  AV Vmean:          81.400 cm/s  AV VTI:            0.220 m  AV Peak Grad:      5.6 mmHg  AV Mean Grad:      3.0 mmHg  LVOT Vmax:         97.70 cm/s  LVOT Vmean:        66.400 cm/s  LVOT VTI:          0.195 m  LVOT/AV VTI ratio: 0.89    AORTA  Ao Root diam: 3.40 cm  Ao Asc diam:  3.50 cm   MITRAL VALVE               TRICUSPID VALVE  MV Area (PHT): 3.99 cm    TR Peak grad:   29.4 mmHg  MV Decel Time: 190 msec    TR Vmax:        271.00 cm/s  MV E velocity: 66.80 cm/s  MV A velocity: 95.50 cm/s  SHUNTS  MV E/A ratio:  0.70        Systemic VTI:  0.20 m                             Systemic Diam: 2.00 cm   Antimicrobials:  Anti-infectives (From admission, onward)    Start     Dose/Rate Route Frequency Ordered Stop   01/28/23 1000  cefdinir (OMNICEF) capsule 300 mg        300 mg Oral Every 12 hours 01/28/23 0848 01/30/23 0959   01/26/23 2200  ceFEPIme (MAXIPIME) 2 g in sodium chloride 0.9 % 100 mL IVPB  Status:  Discontinued        2 g 200 mL/hr over 30 Minutes Intravenous Every 12 hours 01/26/23 1012 01/28/23 0848   01/25/23 1500  ceFEPIme (MAXIPIME) 2 g in sodium chloride 0.9 % 100 mL IVPB  Status:  Discontinued        2 g 200 mL/hr over 30 Minutes Intravenous Every 8 hours 01/25/23 1215 01/26/23 1012   01/25/23 1200  vancomycin (VANCOREADY) IVPB 1500 mg/300 mL  Status:  Discontinued        1,500 mg 150 mL/hr over 120 Minutes Intravenous Every 24 hours 01/24/23 1425 01/26/23 0838   01/24/23 2300  ceFEPIme (MAXIPIME) 2 g in sodium chloride 0.9 % 100 mL IVPB  Status:  Discontinued        2 g 200  mL/hr over 30 Minutes Intravenous Every 12 hours 01/24/23 1422 01/25/23 1215   01/24/23 1115  vancomycin (VANCOREADY) IVPB 1500 mg/300 mL        1,500 mg 150 mL/hr over 120 Minutes Intravenous  Once 01/24/23 1103 01/24/23 1405   01/24/23 1100  ceFEPIme (MAXIPIME) 2  g in sodium chloride 0.9 % 100 mL IVPB        2 g 200 mL/hr over 30 Minutes Intravenous  Once 01/24/23 1050 01/24/23 1132       Subjective: Seen and examined at bedside and the patient was doing okay and denied any nausea or vomiting.  Thinks her shortness of breath is getting better.  Feels okay.  Urinating quite a bit.  Feels a little bit better after the blood.  No other concerns or complaints this time.  Objective: Vitals:   01/28/23 1554 01/28/23 1830 01/28/23 1919 01/29/23 0614  BP: 110/63 103/60 124/83 109/83  Pulse: 89 89 89 99  Resp: '18 18 18 17  '$ Temp: 98.2 F (36.8 C) 98 F (36.7 C) 98.9 F (37.2 C) 98.4 F (36.9 C)  TempSrc: Oral Oral Oral Oral  SpO2: 96% 97% 96% 94%  Weight:      Height:        Intake/Output Summary (Last 24 hours) at 01/29/2023 0859 Last data filed at 01/29/2023 0630 Gross per 24 hour  Intake 834.67 ml  Output 1375 ml  Net -540.33 ml   Filed Weights   01/24/23 1514 01/26/23 0500 01/27/23 0500  Weight: 88.9 kg 87.6 kg 84.8 kg   Examination: Physical Exam:  Constitutional: WN/WD overweight African-American female in no acute distress appears calm Respiratory: Diminished to auscultation bilaterally, no wheezing, rales, rhonchi or crackles. Normal respiratory effort and patient is not tachypenic. No accessory muscle use.  Unlabored breathing and not wearing any supplemental oxygen via nasal cannula Cardiovascular: RRR, no murmurs / rubs / gallops. S1 and S2 auscultated.  Trace extremity edema Abdomen: Soft, non-tender, distended secondary to body habitus. Bowel sounds positive.  GU: Deferred. Musculoskeletal: No clubbing / cyanosis of digits/nails. No joint deformity upper and lower  extremities.  Skin: No rashes, lesions, ulcers on limited skin evaluation. No induration; Warm and dry.  Neurologic: CN 2-12 grossly intact with no focal deficits. Romberg sign and cerebellar reflexes not assessed.  Psychiatric: Normal judgment and insight. Alert and oriented x 3. Normal mood and appropriate affect.   Data Reviewed: I have personally reviewed following labs and imaging studies  CBC: Recent Labs  Lab 01/24/23 1045 01/25/23 0559 01/26/23 0542 01/27/23 0917 01/28/23 0517 01/28/23 2030  WBC 8.3 6.8 7.0 8.3 7.6  --   NEUTROABS 6.4  --  4.8  --   --   --   HGB 8.0* 7.4* 7.4* 8.0* 7.5* 8.8*  HCT 25.4* 23.4* 23.7* 25.8* 24.1* 27.9*  MCV 88.2 87.6 88.4 89.9 87.6  --   PLT 210 191 200 258 226  --    Basic Metabolic Panel: Recent Labs  Lab 01/24/23 1045 01/25/23 0559 01/26/23 0542 01/27/23 0509 01/27/23 0917 01/28/23 0517  NA 134* 137 136  --  136 135  K 3.6 3.6 3.3*  --  3.2* 3.6  CL 100 105 101  --  97* 98  CO2 '25 23 24  '$ --  24 26  GLUCOSE 189* 129* 144*  --  160* 135*  BUN '11 9 12  '$ --  12 16  CREATININE 0.89 0.83 1.03*  --  0.91 0.89  CALCIUM 8.2* 8.1* 8.3*  --  8.7* 8.6*  MG  --   --  1.6* 2.1  --  2.0   GFR: Estimated Creatinine Clearance: 58.5 mL/min (by C-G formula based on SCr of 0.89 mg/dL). Liver Function Tests: Recent Labs  Lab 01/24/23 1045 01/25/23 0559  AST 31 26  ALT 23 20  ALKPHOS 130* 105  BILITOT 0.9 0.6  PROT 6.9 6.5  ALBUMIN 3.0* 2.6*   Recent Labs  Lab 01/24/23 1045  LIPASE 26   No results for input(s): "AMMONIA" in the last 168 hours. Coagulation Profile: Recent Labs  Lab 01/24/23 1045  INR 1.8*   Cardiac Enzymes: No results for input(s): "CKTOTAL", "CKMB", "CKMBINDEX", "TROPONINI" in the last 168 hours. BNP (last 3 results) No results for input(s): "PROBNP" in the last 8760 hours. HbA1C: No results for input(s): "HGBA1C" in the last 72 hours. CBG: Recent Labs  Lab 01/28/23 0742 01/28/23 1152 01/28/23 1627  01/28/23 2107 01/29/23 0724  GLUCAP 140* 143* 177* 166* 148*   Lipid Profile: No results for input(s): "CHOL", "HDL", "LDLCALC", "TRIG", "CHOLHDL", "LDLDIRECT" in the last 72 hours. Thyroid Function Tests: No results for input(s): "TSH", "T4TOTAL", "FREET4", "T3FREE", "THYROIDAB" in the last 72 hours. Anemia Panel: No results for input(s): "VITAMINB12", "FOLATE", "FERRITIN", "TIBC", "IRON", "RETICCTPCT" in the last 72 hours. Sepsis Labs: Recent Labs  Lab 01/24/23 1120 01/24/23 1326 01/24/23 1854  PROCALCITON  --   --  <0.10  LATICACIDVEN 1.6 1.4  --    Recent Results (from the past 240 hour(s))  Resp panel by RT-PCR (RSV, Flu A&B, Covid) Anterior Nasal Swab     Status: None   Collection Time: 01/24/23 10:15 AM   Specimen: Anterior Nasal Swab  Result Value Ref Range Status   SARS Coronavirus 2 by RT PCR NEGATIVE NEGATIVE Final    Comment: (NOTE) SARS-CoV-2 target nucleic acids are NOT DETECTED.  The SARS-CoV-2 RNA is generally detectable in upper respiratory specimens during the acute phase of infection. The lowest concentration of SARS-CoV-2 viral copies this assay can detect is 138 copies/mL. A negative result does not preclude SARS-Cov-2 infection and should not be used as the sole basis for treatment or other patient management decisions. A negative result may occur with  improper specimen collection/handling, submission of specimen other than nasopharyngeal swab, presence of viral mutation(s) within the areas targeted by this assay, and inadequate number of viral copies(<138 copies/mL). A negative result must be combined with clinical observations, patient history, and epidemiological information. The expected result is Negative.  Fact Sheet for Patients:  EntrepreneurPulse.com.au  Fact Sheet for Healthcare Providers:  IncredibleEmployment.be  This test is no t yet approved or cleared by the [place] FDA and  has been  authorized for detection and/or diagnosis of SARS-CoV-2 by FDA under an Emergency Use Authorization (EUA). This EUA will remain  in effect (meaning this test can be used) for the duration of the COVID-19 declaration under Section 564(b)(1) of the Act, 21 U.S.C.section 360bbb-3(b)(1), unless the authorization is terminated  or revoked sooner.       Influenza A by PCR NEGATIVE NEGATIVE Final   Influenza B by PCR NEGATIVE NEGATIVE Final    Comment: (NOTE) The Xpert Xpress SARS-CoV-2/FLU/RSV plus assay is intended as an aid in the diagnosis of influenza from Nasopharyngeal swab specimens and should not be used as a sole basis for treatment. Nasal washings and aspirates are unacceptable for Xpert Xpress SARS-CoV-2/FLU/RSV testing.  Fact Sheet for Patients: EntrepreneurPulse.com.au  Fact Sheet for Healthcare Providers: IncredibleEmployment.be  This test is not yet approved or cleared by the [place] FDA and has been authorized for detection and/or diagnosis of SARS-CoV-2 by FDA under an Emergency Use Authorization (EUA). This EUA will remain in effect (meaning this test can be used) for the duration of the COVID-19 declaration under Section  564(b)(1) of the Act, 21 U.S.C. section 360bbb-3(b)(1), unless the authorization is terminated or revoked.     Resp Syncytial Virus by PCR NEGATIVE NEGATIVE Final    Comment: (NOTE) Fact Sheet for Patients: EntrepreneurPulse.com.au  Fact Sheet for Healthcare Providers: IncredibleEmployment.be  This test is not yet approved or cleared by the [place] FDA and has been authorized for detection and/or diagnosis of SARS-CoV-2 by FDA under an Emergency Use Authorization (EUA). This EUA will remain in effect (meaning this test can be used) for the duration of the COVID-19 declaration under Section 564(b)(1) of the Act, 21 U.S.C. section 360bbb-3(b)(1), unless the  authorization is terminated or revoked.  Performed at [facility], [place] [address] [place], [place] [address]   Blood culture (routine x 2)     Status: None   Collection Time: 01/24/23 11:17 AM   Specimen: BLOOD LEFT ARM  Result Value Ref Range Status   Specimen Description   Final    BLOOD LEFT ARM BOTTLES DRAWN AEROBIC AND ANAEROBIC Performed at [place] [address], [place], [place] [address]    Special Requests   Final    Blood Culture adequate volume Performed at [place] [address], [place], [place] [address]    Culture   Final    NO GROWTH 5 DAYS Performed at [facility], [place] [address], [place], [place] [address]    Report Status 01/29/2023 FINAL  Final  Blood culture (routine x 2)     Status: None   Collection Time: 01/24/23 11:25 AM   Specimen: BLOOD LEFT HAND  Result Value Ref Range Status   Specimen Description BLOOD LEFT HAND  Final   Special Requests   Final    BOTTLES DRAWN AEROBIC AND ANAEROBIC Blood Culture adequate volume   Culture   Final    NO GROWTH 5 DAYS Performed at [facility], [place] [address], [place], [place] [address]    Report Status 01/29/2023 FINAL  Final  Respiratory (~20 pathogens) panel by PCR     Status: None   Collection Time: 01/24/23 12:33 PM   Specimen: Nasopharyngeal Swab; Respiratory  Result Value Ref Range Status   Adenovirus NOT DETECTED NOT DETECTED Final   Coronavirus 229E NOT DETECTED NOT DETECTED Final    Comment: (NOTE) The Coronavirus on the Respiratory Panel, DOES NOT test for the novel  Coronavirus (2019 nCoV)    Coronavirus HKU1 NOT DETECTED NOT DETECTED Final   Coronavirus NL63 NOT DETECTED NOT DETECTED Final   Coronavirus OC43 NOT DETECTED NOT DETECTED Final   Metapneumovirus NOT DETECTED NOT DETECTED Final   Rhinovirus / Enterovirus NOT DETECTED NOT DETECTED Final   Influenza A NOT DETECTED NOT DETECTED Final   Influenza B  NOT DETECTED NOT DETECTED Final   Parainfluenza Virus 1 NOT DETECTED NOT DETECTED Final   Parainfluenza Virus 2 NOT DETECTED NOT DETECTED Final   Parainfluenza Virus 3 NOT DETECTED NOT DETECTED Final   Parainfluenza Virus 4 NOT DETECTED NOT DETECTED Final   Respiratory Syncytial Virus NOT DETECTED NOT DETECTED Final   Bordetella pertussis NOT DETECTED NOT DETECTED Final   Bordetella Parapertussis NOT DETECTED NOT DETECTED Final   Chlamydophila pneumoniae NOT DETECTED NOT DETECTED Final   Mycoplasma pneumoniae NOT DETECTED NOT DETECTED Final    Comment: Performed at [facility], [place], [place], [place] [address]  MRSA Next Gen by PCR, Nasal     Status: None  Collection Time: 01/25/23  1:15 PM   Specimen: Nasal Mucosa; Nasal Swab  Result Value Ref Range Status   MRSA by PCR Next Gen NOT DETECTED NOT DETECTED Final    Comment: (NOTE) The GeneXpert MRSA Assay (FDA approved for NASAL specimens only), is one component of a comprehensive MRSA colonization surveillance program. It is not intended to diagnose MRSA infection nor to guide or monitor treatment for MRSA infections. Test performance is not FDA approved in patients less than 84 years old. Performed at [facility], [place] [address] [place], [place] [address]     Radiology Studies: No results found.  Scheduled Meds:  ALPRAZolam  0.5 mg Oral BID   apixaban  5 mg Oral BID   benzonatate  200 mg Oral TID   cefdinir  300 mg Oral Q12H   ferrous sulfate  325 mg Oral Daily   furosemide  20 mg Oral Daily   guaiFENesin  600 mg Oral BID   losartan  100 mg Oral Daily   And   hydrochlorothiazide  25 mg Oral Daily   insulin aspart  0-15 Units Subcutaneous TID WC   insulin aspart  0-5 Units Subcutaneous QHS   pantoprazole  40 mg Oral Daily   polyethylene glycol  17 g Oral Daily   senna-docusate  1 tablet Oral BID   Continuous Infusions:   LOS: 4 days   Raiford Noble, DO [facility] Available via Epic secure chat 7am-7pm After these hours, please refer to coverage provider listed on [email or web address] 01/29/2023, 8:59 AM

## 2023-01-30 ENCOUNTER — Inpatient Hospital Stay (HOSPITAL_COMMUNITY): Payer: Medicare HMO

## 2023-01-30 DIAGNOSIS — C259 Malignant neoplasm of pancreas, unspecified: Secondary | ICD-10-CM | POA: Diagnosis not present

## 2023-01-30 DIAGNOSIS — J189 Pneumonia, unspecified organism: Secondary | ICD-10-CM | POA: Diagnosis not present

## 2023-01-30 DIAGNOSIS — R0602 Shortness of breath: Secondary | ICD-10-CM | POA: Diagnosis not present

## 2023-01-30 DIAGNOSIS — I5031 Acute diastolic (congestive) heart failure: Secondary | ICD-10-CM | POA: Diagnosis not present

## 2023-01-30 LAB — COMPREHENSIVE METABOLIC PANEL
ALT: 47 U/L — ABNORMAL HIGH (ref 0–44)
AST: 81 U/L — ABNORMAL HIGH (ref 15–41)
Albumin: 3 g/dL — ABNORMAL LOW (ref 3.5–5.0)
Alkaline Phosphatase: 188 U/L — ABNORMAL HIGH (ref 38–126)
Anion gap: 12 (ref 5–15)
BUN: 25 mg/dL — ABNORMAL HIGH (ref 8–23)
CO2: 28 mmol/L (ref 22–32)
Calcium: 9 mg/dL (ref 8.9–10.3)
Chloride: 95 mmol/L — ABNORMAL LOW (ref 98–111)
Creatinine, Ser: 1.09 mg/dL — ABNORMAL HIGH (ref 0.44–1.00)
GFR, Estimated: 52 mL/min — ABNORMAL LOW (ref >60)
Glucose, Bld: 195 mg/dL — ABNORMAL HIGH (ref 70–99)
Potassium: 4.2 mmol/L (ref 3.5–5.1)
Sodium: 135 mmol/L (ref 135–145)
Total Bilirubin: 1.1 mg/dL (ref 0.3–1.2)
Total Protein: 6.8 g/dL (ref 6.5–8.1)

## 2023-01-30 LAB — CBC WITH DIFFERENTIAL/PLATELET
Abs Immature Granulocytes: 0.07 10*3/uL (ref 0.00–0.07)
Basophils Absolute: 0 10*3/uL (ref 0.0–0.1)
Basophils Relative: 0 %
Eosinophils Absolute: 0.1 10*3/uL (ref 0.0–0.5)
Eosinophils Relative: 1 %
HCT: 31 % — ABNORMAL LOW (ref 36.0–46.0)
Hemoglobin: 9.9 g/dL — ABNORMAL LOW (ref 12.0–15.0)
Immature Granulocytes: 1 %
Lymphocytes Relative: 14 %
Lymphs Abs: 1.3 10*3/uL (ref 0.7–4.0)
MCH: 28.2 pg (ref 26.0–34.0)
MCHC: 31.9 g/dL (ref 30.0–36.0)
MCV: 88.3 fL (ref 80.0–100.0)
Monocytes Absolute: 1 10*3/uL (ref 0.1–1.0)
Monocytes Relative: 10 %
Neutro Abs: 7 10*3/uL (ref 1.7–7.7)
Neutrophils Relative %: 74 %
Platelets: 263 10*3/uL (ref 150–400)
RBC: 3.51 MIL/uL — ABNORMAL LOW (ref 3.87–5.11)
RDW: 18.1 % — ABNORMAL HIGH (ref 11.5–15.5)
WBC: 9.5 10*3/uL (ref 4.0–10.5)
nRBC: 0 % (ref 0.0–0.2)

## 2023-01-30 LAB — PHOSPHORUS: Phosphorus: 4 mg/dL (ref 2.5–4.6)

## 2023-01-30 LAB — GLUCOSE, CAPILLARY
Glucose-Capillary: 183 mg/dL — ABNORMAL HIGH (ref 70–99)
Glucose-Capillary: 205 mg/dL — ABNORMAL HIGH (ref 70–99)
Glucose-Capillary: 118 mg/dL — ABNORMAL HIGH (ref 70–99)
Glucose-Capillary: 182 mg/dL — ABNORMAL HIGH (ref 70–99)

## 2023-01-30 LAB — MAGNESIUM: Magnesium: 2 mg/dL (ref 1.7–2.4)

## 2023-01-30 MED ORDER — OXYCODONE HCL 5 MG PO TABS
5.0000 mg | ORAL_TABLET | Freq: Once | ORAL | Status: AC
  Administered 2023-01-30: 5 mg via ORAL
  Filled 2023-01-30: qty 1

## 2023-01-30 NOTE — Progress Notes (Signed)
PROGRESS NOTE    Victoria [name]  ZOX:[id number] DOB: [birth year] DOA: 01/24/2023 PCP: Elby Showers, MD   Brief Narrative:  The patient is a 82 year old overweight African-American female with a past medical history significant for but not limited to history of PE and DVT on anticoagulation, diabetes mellitus type 2, pancreatic cancer, hypertension as well as other comorbidities who presented with weakness and shortness of breath. She reports that she had been doing well since her discharge from the hospital for community-acquired pneumonia however over the last 2 days she notes that she had a productive cough and weakness. She did not have any fever, nausea, vomiting, diarrhea or sick contacts. She took some over-the-counter medications prescribed for her cough however did did not help. Her weakness worsened so she decided come to the ED for further evaluation. Her initial chest x-ray showed a patchy bibasilar airspace opacity with a multifocal pneumonia versus asymmetrical pulmonary edema noted and so she was given treatment with IV antibiotics and started on diuresis. She started improving with diuresis and her antibiotics were changed to oral medications.    Assessment and Plan:  Dyspnea 2/2 Multifocal pneumonia and likely concomitant Acute Diastolic CHF Exacerbation. -Patient presented with generalized weakness, shortness of breath. -Patient noted to have recently hospitalized and treated for a atypical pneumonia with 5 days of IV Rocephin and IV azithromycin. -BNP within normal limits. -Procalcitonin negative. -MRSA PCR negative. -Blood cultures pending with no growth to date x 5 Days. -SpO2: 96 % -Respiratory viral panel negative. -SARS coronavirus PCR negative, influenza A and B PCR negative, respiratory syncytial virus negative. -Discontinued IV Vancomycin and Continue IV Cefepime.   -2D echo with EF 60 to 65%, NWMA, grade 1 diastolic dysfunction.   -Patient started on Lasix 20 mg  IV every 12 hours and was Continued on Lasix 20 mg IV every 12 hours yesterday and  transitioned to oral Lasix 20 mg today and given an additional dose of IV Lasix 20 mg x1 today  -Omnicef to be discontinued tomorrow -C/w Guaifenesin 600 mg po BID -Strict I's and O's, daily weights. -Repeat CXR yesterday AM done and showed "Normal cardiac silhouette. Patchy bilateral airspace opacities appears slightly improved. No focal consolidation. No pneumothorax. No acute osseous abnormality." -Chest x-ray done today showed "Interval worsening of patchy heterogeneous airspace disease in the lower lung fields bilaterally. Small pleural effusions. No findings of acute CHF or other significant change." -Patient felt more dyspneic today and given her worsening chest x-ray we have consulted pulmonary for further evaluation; pulmonary evaluated and she is recently been treated for pneumonia as well as decompensated diastolic heart failure with her infectious workup being negative and blood cultures being negative pulmonary had no really further recommendations and recommended no changes to current management -Repeat ambulatory home O2 screen prior to discharge in the morning and continue supportive care  Acute Diastolic CHF Exacerbation/Volume Overload -Patient had shortness of breath, initial concern was for multifocal pneumonia. -Patient placed on IV Lasix with good urine output and clinical improvement with diuresis. -2D echo  with EF 60 to 65%, NWMA, grade 1 diastolic dysfunction. -IV Lasix has been transitioned to oral Lasix 20 mg daily now -Strict I's and O's and Daily Weights; Patient is -9 lbs since 01/24/23 and is -3.588 Liters since Admission  -DG Chest X-Ray done and showed "Normal cardiac silhouette. Patchy bilateral airspace opacities appears slightly improved. No focal consolidation. No pneumothorax. No acute osseous abnormality." -May benefit from outpatient follow-up with Cardiology.  Hypertension -Continue home regimen of Losartan but will discontinue HCTZ. -He was transitioned off of IV Lasix to p.o. Lasix 20 mg daily now -Will purse discontinuation of HCTZ since the patient is being transitioned to oral Furosemide -Continue to Monitor BP per Protocol -C/w Hydralazine 10 mg IV q8hprn for HBP  -Last BP Reading was 129/70   History of DVT/PE -Continue Anticoagulation with Apixaban 5 mg po BID   Diabetes Mellitus Type 2 -Hemoglobin A1c 6.7 (12/06/2022) -Continue to hold oral hypoglycemic agents.   -Continue Moderate Novolog SSI AC/HS. -CBG's ranging from 118-205  Abnormal LFTs in setting of history of hepatitis C liver cirrhosis -LFT Trend: Recent Labs  Lab 01/13/23 2134 01/13/23 2227 01/15/23 0446 01/15/23 0942 01/16/23 0442 01/17/23 0446 01/24/23 1045 01/25/23 0559 01/26/23 0542 01/29/23 0919 01/30/23 0505  AST 37  --  37  --  46*  --  31 26  --  47* 81*  ALT 25  --  26  --  32  --  23 20  --  33 47*  MCV 84.4   < > 91.7   < > 84.6   < > 88.2 87.6   < > 88.8 88.3   < > = values in this interval not displayed.  -Check right upper quadrant ultrasound and showed "Status post cholecystectomy. Findings consistent with hepatic cirrhosis. No definite focal sonographic hepatic abnormality seen." -She follows up with [facility] and has a known history of cirrhosis -We will discuss the case with her Gastroenterologist in the morning and if stable likely can be discharged   Normocytic Anemia -Anemia panel consistent with anemia of chronic disease and showed an iron level of 87, UIBC of 308, TIBC 395, saturation ratios of 22% -Patient was complains of some generalized weakness, denies any overt bleeding. -C/w Ferrous Sulfate tab 325 mg po Daily  -Due to current presentation with shortness of breath, concern for volume overload she was transfused 1 unit of PRBC -Hgb/Hct Trend: Recent Labs  Lab 01/25/23 0559 01/26/23 0542 01/27/23 0917  01/28/23 0517 01/28/23 2030 01/29/23 0919 01/30/23 0505  HGB 7.4* 7.4* 8.0* 7.5* 8.8* 9.3* 9.9*  HCT 23.4* 23.7* 25.8* 24.1* 27.9* 29.2* 31.0*  MCV 87.6 88.4 89.9 87.6  --  88.8 88.3  -Follow H&H. -Transfusion threshold hemoglobin < 8. -Continue to monitor for signs and symptoms of bleeding; no overt bleeding noted -Repeat CBC in a.m.   History of Pancreatic Cancer -Outpatient follow-up with Oncology. -Oncology Dr. Burr Medico notified of admission via epic and I discussed the case with Dr. Burr Medico and patient was supposed to have a Port-A-Cath placed however Dr. Burr Medico take care of this in the outpatient setting early next week.   Hypokalemia, improved  -Patient's K+ Level Trend: Recent Labs  Lab 01/24/23 1045 01/25/23 0559 01/26/23 0542 01/27/23 0917 01/28/23 0517 01/29/23 0919 01/30/23 0505  K 3.6 3.6 3.3* 3.2* 3.6 3.7 4.2  -Replete with po Kcl 40 mEQ x1 given continued Diuresis -Mag Level was 1.9 -Continue to Monitor and Replete as Necessary -Repeat CMP in the AM    Hypoalbuminemia -Patient's Albumin Trend: Recent Labs  Lab 01/13/23 2134 01/15/23 0446 01/16/23 0442 01/24/23 1045 01/25/23 0559 01/29/23 0919 01/30/23 0505  ALBUMIN 3.5 2.7* 2.9* 3.0* 2.6* 2.9* 3.0*  -Continue to Monitor and Trend and repeat CMP in the AM   Overweight -Complicates overall prognosis and care -Estimated body mass index is 28.66 kg/m as calculated from the following:   Height as of this encounter: '5\' 8"'$  (1.727  m).   Weight as of this encounter: 85.5 kg.  -Weight Loss and Dietary Counseling given   GERD/GI Prophylaxis -C/w Pantoprazole 40 mg po Daily   DVT prophylaxis:  apixaban (ELIQUIS) tablet 5 mg    Code Status: Full Code Family Communication: No family currently at bedside  Disposition Plan:  Level of care: Telemetry Status is: Inpatient Remains inpatient appropriate because: Needs further clinical improvement in her dyspnea and anticipating discharge in next 24 to 48 hours    Consultants:  Pulmonary Discussed the case with medical oncology  Procedures:  As delineated as above  ECHOCARDIOGRAM IMPRESSIONS    1. Left ventricular ejection fraction, by estimation, is 60 to 65%. The  left ventricle has normal function. The left ventricle has no regional  wall motion abnormalities. There is mild concentric left ventricular  hypertrophy. Left ventricular diastolic  parameters are consistent with Grade I diastolic dysfunction (impaired  relaxation).   2. Right ventricular systolic function is normal. The right ventricular  size is normal. There is normal pulmonary artery systolic pressure.   3. No evidence of mitral valve regurgitation.   4. The aortic valve is grossly normal. Aortic valve regurgitation is not  visualized.   5. The inferior vena cava is normal in size with greater than 50%  respiratory variability, suggesting right atrial pressure of 3 mmHg.   Comparison(s): No significant change from prior study.   FINDINGS   Left Ventricle: Left ventricular ejection fraction, by estimation, is 60  to 65%. The left ventricle has normal function. The left ventricle has no  regional wall motion abnormalities. The left ventricular internal cavity  size was normal in size. There is   mild concentric left ventricular hypertrophy. Left ventricular diastolic  parameters are consistent with Grade I diastolic dysfunction (impaired  relaxation).   Right Ventricle: The right ventricular size is normal. Right ventricular  systolic function is normal. There is normal pulmonary artery systolic  pressure. The tricuspid regurgitant velocity is 2.71 m/s, and with an  assumed right atrial pressure of 3 mmHg,   the estimated right ventricular systolic pressure is 10.2 mmHg.   Left Atrium: Left atrial size was normal in size.   Right Atrium: Right atrial size was normal in size.   Pericardium: There is no evidence of pericardial effusion.   Mitral Valve: No evidence  of mitral valve regurgitation.   Tricuspid Valve: Tricuspid valve regurgitation is mild.   Aortic Valve: The aortic valve is grossly normal. Aortic valve  regurgitation is not visualized. Aortic valve mean gradient measures 3.0  mmHg. Aortic valve peak gradient measures 5.6 mmHg. Aortic valve area, by  VTI measures 2.78 cm.   Pulmonic Valve: Pulmonic valve regurgitation is not visualized.   Aorta: The aortic root and ascending aorta are structurally normal, with  no evidence of dilitation.   Venous: The inferior vena cava is normal in size with greater than 50%  respiratory variability, suggesting right atrial pressure of 3 mmHg.   IAS/Shunts: No atrial level shunt detected by color flow Doppler.    LEFT VENTRICLE  PLAX 2D  LVIDd:         3.50 cm     Diastology  LVIDs:         2.40 cm     LV e' medial:    5.33 cm/s  LV PW:         1.10 cm     LV E/e' medial:  12.5  LV IVS:  1.30 cm     LV e' lateral:   7.29 cm/s  LVOT diam:     2.00 cm     LV E/e' lateral: 9.2  LV SV:         61  LV SV Index:   30  LVOT Area:     3.14 cm                               3D Volume EF:  LV Volumes (MOD)           3D EF:        55 %  LV vol d, MOD A2C: 51.5 ml LV EDV:       119 ml  LV vol d, MOD A4C: 71.4 ml LV ESV:       54 ml  LV vol s, MOD A2C: 20.2 ml LV SV:        66 ml  LV vol s, MOD A4C: 27.8 ml  LV SV MOD A2C:     31.3 ml  LV SV MOD A4C:     71.4 ml  LV SV MOD BP:      37.2 ml   RIGHT VENTRICLE  RV S prime:     17.00 cm/s  TAPSE (M-mode): 1.9 cm   LEFT ATRIUM           Index  LA diam:      3.20 cm 1.59 cm/m  LA Vol (A4C): 40.9 ml 20.31 ml/m   AORTIC VALVE                    PULMONIC VALVE  AV Area (Vmax):    2.60 cm     PV Vmax:       0.81 m/s  AV Area (Vmean):   2.56 cm     PV Peak grad:  2.7 mmHg  AV Area (VTI):     2.78 cm  AV Vmax:           118.00 cm/s  AV Vmean:          81.400 cm/s  AV VTI:            0.220 m  AV Peak Grad:      5.6 mmHg  AV Mean Grad:       3.0 mmHg  LVOT Vmax:         97.70 cm/s  LVOT Vmean:        66.400 cm/s  LVOT VTI:          0.195 m  LVOT/AV VTI ratio: 0.89    AORTA  Ao Root diam: 3.40 cm  Ao Asc diam:  3.50 cm   MITRAL VALVE               TRICUSPID VALVE  MV Area (PHT): 3.99 cm    TR Peak grad:   29.4 mmHg  MV Decel Time: 190 msec    TR Vmax:        271.00 cm/s  MV E velocity: 66.80 cm/s  MV A velocity: 95.50 cm/s  SHUNTS  MV E/A ratio:  0.70        Systemic VTI:  0.20 m                             Systemic Diam: 2.00 cm   Antimicrobials:  Anti-infectives (From admission, onward)  Start     Dose/Rate Route Frequency Ordered Stop   01/28/23 1000  cefdinir (OMNICEF) capsule 300 mg        300 mg Oral Every 12 hours 01/28/23 0848 01/29/23 2145   01/26/23 2200  ceFEPIme (MAXIPIME) 2 g in sodium chloride 0.9 % 100 mL IVPB  Status:  Discontinued        2 g 200 mL/hr over 30 Minutes Intravenous Every 12 hours 01/26/23 1012 01/28/23 0848   01/25/23 1500  ceFEPIme (MAXIPIME) 2 g in sodium chloride 0.9 % 100 mL IVPB  Status:  Discontinued        2 g 200 mL/hr over 30 Minutes Intravenous Every 8 hours 01/25/23 1215 01/26/23 1012   01/25/23 1200  vancomycin (VANCOREADY) IVPB 1500 mg/300 mL  Status:  Discontinued        1,500 mg 150 mL/hr over 120 Minutes Intravenous Every 24 hours 01/24/23 1425 01/26/23 0838   01/24/23 2300  ceFEPIme (MAXIPIME) 2 g in sodium chloride 0.9 % 100 mL IVPB  Status:  Discontinued        2 g 200 mL/hr over 30 Minutes Intravenous Every 12 hours 01/24/23 1422 01/25/23 1215   01/24/23 1115  vancomycin (VANCOREADY) IVPB 1500 mg/300 mL        1,500 mg 150 mL/hr over 120 Minutes Intravenous  Once 01/24/23 1103 01/24/23 1405   01/24/23 1100  ceFEPIme (MAXIPIME) 2 g in sodium chloride 0.9 % 100 mL IVPB        2 g 200 mL/hr over 30 Minutes Intravenous  Once 01/24/23 1050 01/24/23 1132      Subjective: Seen and examined at bedside and states that she is not doing as well today and continue to  be dyspneic.  States that she is coughing and feels that her respiratory status was a little worse today compared to yesterday.  Denies any nausea or vomiting.  Thinks her leg swelling is improved though.  No other concerns or complaints at this time.  Objective: Vitals:   01/30/23 0500 01/30/23 0512 01/30/23 0555 01/30/23 1239  BP:  107/81  (!) 129/90  Pulse:  76 98 90  Resp:  16  18  Temp:  98.4 F (36.9 C)  97.7 F (36.5 C)  TempSrc:  Oral  Oral  SpO2:  (!) 88% 95% 93%  Weight: 85.5 kg     Height:        Intake/Output Summary (Last 24 hours) at 01/30/2023 1654 Last data filed at 01/30/2023 0500 Gross per 24 hour  Intake 120 ml  Output 1000 ml  Net -880 ml   Filed Weights   01/26/23 0500 01/27/23 0500 01/30/23 0500  Weight: 87.6 kg 84.8 kg 85.5 kg   Examination: Physical Exam:  Constitutional: WN/WD overweight African-American female and no acute distress appears a little fatigued Respiratory: Diminished to auscultation bilaterally, no wheezing, rales, rhonchi or crackles. Normal respiratory effort and patient is not tachypenic. No accessory muscle use.  Unlabored breathing and not wearing supplemental oxygen nasal cannula Cardiovascular: RRR, no murmurs / rubs / gallops. S1 and S2 auscultated.  Has trace extremity edema Abdomen: Soft, non-tender, distended secondary to body habitus. Bowel sounds positive.  GU: Deferred. Musculoskeletal: No clubbing / cyanosis of digits/nails. No joint deformity upper and lower extremities.  Skin: No rashes, lesions, ulcers on limited skin evaluation. No induration; Warm and dry.  Neurologic: CN 2-12 grossly intact with no focal deficits. Romberg sign and cerebellar reflexes not assessed.  Psychiatric: Normal judgment and  insight. Alert and oriented x 3.  A little anxious mood and appropriate affect.   Data Reviewed: I have personally reviewed following labs and imaging studies  CBC: Recent Labs  Lab 01/24/23 1045 01/25/23 0559  01/26/23 0542 01/27/23 0917 01/28/23 0517 01/28/23 2030 01/29/23 0919 01/30/23 0505  WBC 8.3   < > 7.0 8.3 7.6  --  8.8 9.5  NEUTROABS 6.4  --  4.8  --   --   --  6.4 7.0  HGB 8.0*   < > 7.4* 8.0* 7.5* 8.8* 9.3* 9.9*  HCT 25.4*   < > 23.7* 25.8* 24.1* 27.9* 29.2* 31.0*  MCV 88.2   < > 88.4 89.9 87.6  --  88.8 88.3  PLT 210   < > 200 258 226  --  239 263   < > = values in this interval not displayed.   Basic Metabolic Panel: Recent Labs  Lab 01/26/23 0542 01/27/23 0509 01/27/23 0917 01/28/23 0517 01/29/23 0919 01/30/23 0505  NA 136  --  136 135 136 135  K 3.3*  --  3.2* 3.6 3.7 4.2  CL 101  --  97* 98 98 95*  CO2 24  --  '24 26 26 28  '$ GLUCOSE 144*  --  160* 135* 149* 195*  BUN 12  --  '12 16 22 '$ 25*  CREATININE 1.03*  --  0.91 0.89 0.98 1.09*  CALCIUM 8.3*  --  8.7* 8.6* 8.8* 9.0  MG 1.6* 2.1  --  2.0 1.9 2.0  PHOS  --   --   --   --  3.9 4.0   GFR: Estimated Creatinine Clearance: 47.9 mL/min (A) (by C-G formula based on SCr of 1.09 mg/dL (H)). Liver Function Tests: Recent Labs  Lab 01/24/23 1045 01/25/23 0559 01/29/23 0919 01/30/23 0505  AST 31 26 47* 81*  ALT 23 20 33 47*  ALKPHOS 130* 105 145* 188*  BILITOT 0.9 0.6 1.0 1.1  PROT 6.9 6.5 6.8 6.8  ALBUMIN 3.0* 2.6* 2.9* 3.0*   Recent Labs  Lab 01/24/23 1045  LIPASE 26   No results for input(s): "AMMONIA" in the last 168 hours. Coagulation Profile: Recent Labs  Lab 01/24/23 1045  INR 1.8*   Cardiac Enzymes: No results for input(s): "CKTOTAL", "CKMB", "CKMBINDEX", "TROPONINI" in the last 168 hours. BNP (last 3 results) No results for input(s): "PROBNP" in the last 8760 hours. HbA1C: No results for input(s): "HGBA1C" in the last 72 hours. CBG: Recent Labs  Lab 01/29/23 1659 01/29/23 2138 01/30/23 0730 01/30/23 1120 01/30/23 1621  GLUCAP 161* 136* 183* 205* 118*   Lipid Profile: No results for input(s): "CHOL", "HDL", "LDLCALC", "TRIG", "CHOLHDL", "LDLDIRECT" in the last 72 hours. Thyroid  Function Tests: No results for input(s): "TSH", "T4TOTAL", "FREET4", "T3FREE", "THYROIDAB" in the last 72 hours. Anemia Panel: No results for input(s): "VITAMINB12", "FOLATE", "FERRITIN", "TIBC", "IRON", "RETICCTPCT" in the last 72 hours. Sepsis Labs: Recent Labs  Lab 01/24/23 1120 01/24/23 1326 01/24/23 1854  PROCALCITON  --   --  <0.10  LATICACIDVEN 1.6 1.4  --     Recent Results (from the past 240 hour(s))  Resp panel by RT-PCR (RSV, Flu A&B, Covid) Anterior Nasal Swab     Status: None   Collection Time: 01/24/23 10:15 AM   Specimen: Anterior Nasal Swab  Result Value Ref Range Status   SARS Coronavirus 2 by RT PCR NEGATIVE NEGATIVE Final    Comment: (NOTE) SARS-CoV-2 target nucleic acids are NOT DETECTED.  The SARS-CoV-2  RNA is generally detectable in upper respiratory specimens during the acute phase of infection. The lowest concentration of SARS-CoV-2 viral copies this assay can detect is 138 copies/mL. A negative result does not preclude SARS-Cov-2 infection and should not be used as the sole basis for treatment or other patient management decisions. A negative result may occur with  improper specimen collection/handling, submission of specimen other than nasopharyngeal swab, presence of viral mutation(s) within the areas targeted by this assay, and inadequate number of viral copies(<138 copies/mL). A negative result must be combined with clinical observations, patient history, and epidemiological information. The expected result is Negative.  Fact Sheet for Patients:  EntrepreneurPulse.com.au  Fact Sheet for Healthcare Providers:  IncredibleEmployment.be  This test is no t yet approved or cleared by the [place] FDA and  has been authorized for detection and/or diagnosis of SARS-CoV-2 by FDA under an Emergency Use Authorization (EUA). This EUA will remain  in effect (meaning this test can be used) for the duration of  the COVID-19 declaration under Section 564(b)(1) of the Act, 21 U.S.C.section 360bbb-3(b)(1), unless the authorization is terminated  or revoked sooner.       Influenza A by PCR NEGATIVE NEGATIVE Final   Influenza B by PCR NEGATIVE NEGATIVE Final    Comment: (NOTE) The Xpert Xpress SARS-CoV-2/FLU/RSV plus assay is intended as an aid in the diagnosis of influenza from Nasopharyngeal swab specimens and should not be used as a sole basis for treatment. Nasal washings and aspirates are unacceptable for Xpert Xpress SARS-CoV-2/FLU/RSV testing.  Fact Sheet for Patients: EntrepreneurPulse.com.au  Fact Sheet for Healthcare Providers: IncredibleEmployment.be  This test is not yet approved or cleared by the [place] FDA and has been authorized for detection and/or diagnosis of SARS-CoV-2 by FDA under an Emergency Use Authorization (EUA). This EUA will remain in effect (meaning this test can be used) for the duration of the COVID-19 declaration under Section 564(b)(1) of the Act, 21 U.S.C. section 360bbb-3(b)(1), unless the authorization is terminated or revoked.     Resp Syncytial Virus by PCR NEGATIVE NEGATIVE Final    Comment: (NOTE) Fact Sheet for Patients: EntrepreneurPulse.com.au  Fact Sheet for Healthcare Providers: IncredibleEmployment.be  This test is not yet approved or cleared by the [place] FDA and has been authorized for detection and/or diagnosis of SARS-CoV-2 by FDA under an Emergency Use Authorization (EUA). This EUA will remain in effect (meaning this test can be used) for the duration of the COVID-19 declaration under Section 564(b)(1) of the Act, 21 U.S.C. section 360bbb-3(b)(1), unless the authorization is terminated or revoked.  Performed at [facility], [place] [address] [place], [place] [address]   Blood culture (routine x 2)     Status: None   Collection  Time: 01/24/23 11:17 AM   Specimen: BLOOD LEFT ARM  Result Value Ref Range Status   Specimen Description   Final    BLOOD LEFT ARM BOTTLES DRAWN AEROBIC AND ANAEROBIC Performed at [facility] [address], [place], [place] [address]    Special Requests   Final    Blood Culture adequate volume Performed at [facility] [address], [place], [place] [address]    Culture   Final    NO GROWTH 5 DAYS Performed at [facility], [place] [address] [place], [place] [address]    Report Status 01/29/2023 FINAL  Final  Blood culture (routine x 2)     Status: None   Collection Time: 01/24/23  11:25 AM   Specimen: BLOOD LEFT HAND  Result Value Ref Range Status   Specimen Description BLOOD LEFT HAND  Final   Special Requests   Final    BOTTLES DRAWN AEROBIC AND ANAEROBIC Blood Culture adequate volume   Culture   Final    NO GROWTH 5 DAYS Performed at [facility], [address] [place], [place] [address]    Report Status 01/29/2023 FINAL  Final  Respiratory (~20 pathogens) panel by PCR     Status: None   Collection Time: 01/24/23 12:33 PM   Specimen: Nasopharyngeal Swab; Respiratory  Result Value Ref Range Status   Adenovirus NOT DETECTED NOT DETECTED Final   Coronavirus 229E NOT DETECTED NOT DETECTED Final    Comment: (NOTE) The Coronavirus on the Respiratory Panel, DOES NOT test for the novel  Coronavirus (2019 nCoV)    Coronavirus HKU1 NOT DETECTED NOT DETECTED Final   Coronavirus NL63 NOT DETECTED NOT DETECTED Final   Coronavirus OC43 NOT DETECTED NOT DETECTED Final   Metapneumovirus NOT DETECTED NOT DETECTED Final   Rhinovirus / Enterovirus NOT DETECTED NOT DETECTED Final   Influenza A NOT DETECTED NOT DETECTED Final   Influenza B NOT DETECTED NOT DETECTED Final   Parainfluenza Virus 1 NOT DETECTED NOT DETECTED Final   Parainfluenza Virus 2 NOT DETECTED NOT DETECTED Final   Parainfluenza Virus 3 NOT DETECTED NOT DETECTED  Final   Parainfluenza Virus 4 NOT DETECTED NOT DETECTED Final   Respiratory Syncytial Virus NOT DETECTED NOT DETECTED Final   Bordetella pertussis NOT DETECTED NOT DETECTED Final   Bordetella Parapertussis NOT DETECTED NOT DETECTED Final   Chlamydophila pneumoniae NOT DETECTED NOT DETECTED Final   Mycoplasma pneumoniae NOT DETECTED NOT DETECTED Final    Comment: Performed at [facility], [place]. [address] [place], [place] [address]  MRSA Next Gen by PCR, Nasal     Status: None   Collection Time: 01/25/23  1:15 PM   Specimen: Nasal Mucosa; Nasal Swab  Result Value Ref Range Status   MRSA by PCR Next Gen NOT DETECTED NOT DETECTED Final    Comment: (NOTE) The GeneXpert MRSA Assay (FDA approved for NASAL specimens only), is one component of a comprehensive MRSA colonization surveillance program. It is not intended to diagnose MRSA infection nor to guide or monitor treatment for MRSA infections. Test performance is not FDA approved in patients less than 90 years old. Performed at [facility], [place] [address], [place], [place] [address]     Radiology Studies: US Abdomen Limited RUQ (LIVER/GB)  Result Date: 01/30/2023 CLINICAL DATA:  Abnormal liver function tests. EXAM: ULTRASOUND ABDOMEN LIMITED RIGHT UPPER QUADRANT COMPARISON:  October 03, 2014.  November 14, 2014. FINDINGS: Gallbladder: Status post cholecystectomy. Common bile duct: Diameter: 4 mm which is within normal limits. Liver: No focal lesion identified. Nodular hepatic contours are noted suggesting cirrhosis. Heterogeneous echotexture of hepatic parenchyma is noted. Portal vein is patent on color Doppler imaging with normal direction of blood flow towards the liver. Other: None. IMPRESSION: Status post cholecystectomy. Findings consistent with hepatic cirrhosis. No definite focal sonographic hepatic abnormality seen. Electronically Signed   By: Marijo Conception M.D.   On: 01/30/2023 15:30   DG CHEST PORT 1  VIEW  Result Date: 01/30/2023 CLINICAL DATA:  [id number] with shortness of breath. EXAM: PORTABLE CHEST 1 VIEW COMPARISON:  Portable chest yesterday at 9:29 a.m. FINDINGS: 4:55 a.m. There is interval worsening of patchy heterogeneous airspace disease in the lower  lung fields. Small pleural effusions appear similar. The upper lung fields are generally clear. There is mild cardiomegaly without findings of acute CHF. The aorta is tortuous with calcification in the transverse segment. Stable mediastinum. Slight thoracic dextroscoliosis.  No new osseous abnormality. IMPRESSION: Interval worsening of patchy heterogeneous airspace disease in the lower lung fields bilaterally. Small pleural effusions. No findings of acute CHF or other significant change. Electronically Signed   By: Telford Nab M.D.   On: 01/30/2023 07:18   DG CHEST PORT 1 VIEW  Result Date: 01/29/2023 CLINICAL DATA:  Short of breath EXAM: PORTABLE CHEST 1 VIEW COMPARISON:  Radiograph 01/24/2023, CT 01/13/2023 FINDINGS: Normal cardiac silhouette. Patchy bilateral airspace opacities appears slightly improved. No focal consolidation. No pneumothorax. No acute osseous abnormality. IMPRESSION: Slight improvement in patchy bilateral airspace opacities. Electronically Signed   By: Suzy Bouchard M.D.   On: 01/29/2023 09:59    Scheduled Meds:  ALPRAZolam  0.5 mg Oral BID   apixaban  5 mg Oral BID   benzonatate  200 mg Oral TID   ferrous sulfate  325 mg Oral Daily   furosemide  20 mg Oral Daily   guaiFENesin  600 mg Oral BID   insulin aspart  0-15 Units Subcutaneous TID WC   insulin aspart  0-5 Units Subcutaneous QHS   losartan  100 mg Oral Daily   pantoprazole  40 mg Oral Daily   polyethylene glycol  17 g Oral Daily   senna-docusate  1 tablet Oral BID   Continuous Infusions:   LOS: 5 days   Raiford Noble, DO [facility] Available via Epic secure chat 7am-7pm After these hours, please refer to coverage provider listed on  [email or web address] 01/30/2023, 4:54 PM

## 2023-01-30 NOTE — Consult Note (Signed)
   NAME:  Victoria [name], MRN:  [id number], DOB:  11/24/1941, LOS: 5 ADMISSION DATE:  01/24/2023, CONSULTATION DATE: 01/30/2023 REFERRING MD:  Dr Mordecai Rasmussen, CHIEF COMPLAINT: Shortness of breath  History of Present Illness:  Asked to see patient for shortness of breath  Had been admitted recently for pneumonia, treated with a course of antibiotic and discharge Presented back to the hospital with shortness of breath.  Thought to be fluid overloaded and diuresed Currently on room air, stated she has been walking up and down the hallway, she is progressively better  She does have albuterol at home that she uses as needed if she gets winded otherwise denies label of asthma or obstructive lung disease  Denies a smoking history  Pertinent  Medical History   Past Medical History:  Diagnosis Date   Abnormal finding on Pap smear    Allergy    SEASONAL   Anxiety    Breast cancer ([place]) 2000   right breast lumptectomy, radiation done   Cirrhosis ([place]) 11/2012   resolved per pt   Diabetes mellitus type 2, controlled ([place])    Hepatitis C    took tx for    Hypertension    Obesity    Thrombocytopenia ([place]) 11/2012   Significant Hospital Events: Including procedures, antibiotic start and stop dates in addition to other pertinent events   CT chest 01/13/2023 reviewed by myself, chest x-ray 01/30/2023 with bibasilar infiltrates reviewed  Interim History / Subjective:  Elderly, does not appear to be in distress  Objective   Blood pressure (!) 129/90, pulse 90, temperature 97.7 F (36.5 C), temperature source Oral, resp. rate 18, height '5\' 8"'$  (1.727 m), weight 85.5 kg, SpO2 93 %.        Intake/Output Summary (Last 24 hours) at 01/30/2023 1310 Last data filed at 01/30/2023 0500 Gross per 24 hour  Intake 120 ml  Output 1000 ml  Net -880 ml   Filed Weights   01/26/23 0500 01/27/23 0500 01/30/23 0500  Weight: 87.6 kg 84.8 kg 85.5 kg    Examination: General: Elderly, does not appear Acutely  ill HENT: Moist oral mucosa Lungs: Clear breath sounds Cardiovascular: S1-S2 appreciated Abdomen: Soft, bowel sounds appreciated Extremities: No clubbing, no edema Neuro: Alert and oriented x 3 GU:   Resolved Hospital Problem list     Assessment & Plan:  Shortness of breath -Recently treated for pneumonia -Recently treated for decompensated diastolic heart failure -Infectious workup was negative, blood cultures negative, viral panel is negative -Recently completed course of antibiotics -Clinically better  Diastolic heart failure -Continue to optimize fluid status -Risk profile modification  Hypertension -BP better controlled -On losartan  History of pancreatic cancer -Follows up with oncology  History of DVT/PE -On anticoagulation  Type 2 diabetes  Normocytic anemia  X-ray findings likely lagging clinical improvement Denies underlying lung disease No changes to current management  Thank you for the consultation  Sherrilyn Rist, MD [facility] Pager: See Shea Evans

## 2023-01-31 ENCOUNTER — Inpatient Hospital Stay (HOSPITAL_COMMUNITY): Payer: Medicare HMO

## 2023-01-31 ENCOUNTER — Other Ambulatory Visit: Payer: Self-pay | Admitting: Internal Medicine

## 2023-01-31 DIAGNOSIS — C259 Malignant neoplasm of pancreas, unspecified: Secondary | ICD-10-CM

## 2023-01-31 DIAGNOSIS — I5031 Acute diastolic (congestive) heart failure: Secondary | ICD-10-CM | POA: Diagnosis not present

## 2023-01-31 DIAGNOSIS — E119 Type 2 diabetes mellitus without complications: Secondary | ICD-10-CM | POA: Diagnosis not present

## 2023-01-31 DIAGNOSIS — J189 Pneumonia, unspecified organism: Secondary | ICD-10-CM | POA: Diagnosis not present

## 2023-01-31 DIAGNOSIS — J9601 Acute respiratory failure with hypoxia: Secondary | ICD-10-CM

## 2023-01-31 LAB — COMPREHENSIVE METABOLIC PANEL
ALT: 47 U/L — ABNORMAL HIGH (ref 0–44)
AST: 68 U/L — ABNORMAL HIGH (ref 15–41)
Albumin: 2.8 g/dL — ABNORMAL LOW (ref 3.5–5.0)
Alkaline Phosphatase: 186 U/L — ABNORMAL HIGH (ref 38–126)
Anion gap: 10 (ref 5–15)
BUN: 30 mg/dL — ABNORMAL HIGH (ref 8–23)
CO2: 29 mmol/L (ref 22–32)
Calcium: 8.9 mg/dL (ref 8.9–10.3)
Chloride: 96 mmol/L — ABNORMAL LOW (ref 98–111)
Creatinine, Ser: 1.06 mg/dL — ABNORMAL HIGH (ref 0.44–1.00)
GFR, Estimated: 53 mL/min — ABNORMAL LOW (ref >60)
Glucose, Bld: 245 mg/dL — ABNORMAL HIGH (ref 70–99)
Potassium: 3.9 mmol/L (ref 3.5–5.1)
Sodium: 135 mmol/L (ref 135–145)
Total Bilirubin: 1.2 mg/dL (ref 0.3–1.2)
Total Protein: 6.7 g/dL (ref 6.5–8.1)

## 2023-01-31 LAB — GLUCOSE, CAPILLARY
Glucose-Capillary: 180 mg/dL — ABNORMAL HIGH (ref 70–99)
Glucose-Capillary: 295 mg/dL — ABNORMAL HIGH (ref 70–99)
Glucose-Capillary: 160 mg/dL — ABNORMAL HIGH (ref 70–99)
Glucose-Capillary: 138 mg/dL — ABNORMAL HIGH (ref 70–99)

## 2023-01-31 LAB — CBC WITH DIFFERENTIAL/PLATELET
Abs Immature Granulocytes: 0.03 10*3/uL (ref 0.00–0.07)
Basophils Absolute: 0 10*3/uL (ref 0.0–0.1)
Basophils Relative: 1 %
Eosinophils Absolute: 0.1 10*3/uL (ref 0.0–0.5)
Eosinophils Relative: 1 %
HCT: 30.1 % — ABNORMAL LOW (ref 36.0–46.0)
Hemoglobin: 9.3 g/dL — ABNORMAL LOW (ref 12.0–15.0)
Immature Granulocytes: 0 %
Lymphocytes Relative: 11 %
Lymphs Abs: 1 10*3/uL (ref 0.7–4.0)
MCH: 27.9 pg (ref 26.0–34.0)
MCHC: 30.9 g/dL (ref 30.0–36.0)
MCV: 90.4 fL (ref 80.0–100.0)
Monocytes Absolute: 0.8 10*3/uL (ref 0.1–1.0)
Monocytes Relative: 9 %
Neutro Abs: 6.8 10*3/uL (ref 1.7–7.7)
Neutrophils Relative %: 78 %
Platelets: 204 10*3/uL (ref 150–400)
RBC: 3.33 MIL/uL — ABNORMAL LOW (ref 3.87–5.11)
RDW: 17.8 % — ABNORMAL HIGH (ref 11.5–15.5)
WBC: 8.8 10*3/uL (ref 4.0–10.5)
nRBC: 0 % (ref 0.0–0.2)

## 2023-01-31 LAB — MAGNESIUM: Magnesium: 2 mg/dL (ref 1.7–2.4)

## 2023-01-31 LAB — PHOSPHORUS: Phosphorus: 4 mg/dL (ref 2.5–4.6)

## 2023-01-31 MED ORDER — KETOROLAC TROMETHAMINE 15 MG/ML IJ SOLN
15.0000 mg | Freq: Once | INTRAMUSCULAR | Status: AC
  Administered 2023-01-31: 15 mg via INTRAVENOUS
  Filled 2023-01-31: qty 1

## 2023-01-31 MED ORDER — GUAIFENESIN ER 600 MG PO TB12
1200.0000 mg | ORAL_TABLET | Freq: Two times a day (BID) | ORAL | Status: DC
  Administered 2023-01-31 – 2023-02-04 (×8): 1200 mg via ORAL
  Filled 2023-01-31 (×8): qty 2

## 2023-01-31 MED ORDER — LEVALBUTEROL HCL 0.63 MG/3ML IN NEBU
0.6300 mg | INHALATION_SOLUTION | Freq: Four times a day (QID) | RESPIRATORY_TRACT | Status: DC
  Administered 2023-01-31: 0.63 mg via RESPIRATORY_TRACT
  Filled 2023-01-31: qty 3

## 2023-01-31 MED ORDER — ARFORMOTEROL TARTRATE 15 MCG/2ML IN NEBU
15.0000 ug | INHALATION_SOLUTION | Freq: Two times a day (BID) | RESPIRATORY_TRACT | Status: DC
  Administered 2023-01-31 – 2023-02-04 (×7): 15 ug via RESPIRATORY_TRACT
  Filled 2023-01-31 (×8): qty 2

## 2023-01-31 MED ORDER — IPRATROPIUM BROMIDE 0.02 % IN SOLN
0.5000 mg | Freq: Two times a day (BID) | RESPIRATORY_TRACT | Status: DC
  Administered 2023-02-01 – 2023-02-04 (×6): 0.5 mg via RESPIRATORY_TRACT
  Filled 2023-01-31 (×7): qty 2.5

## 2023-01-31 MED ORDER — IPRATROPIUM BROMIDE 0.02 % IN SOLN
0.5000 mg | Freq: Four times a day (QID) | RESPIRATORY_TRACT | Status: DC
  Administered 2023-01-31: 0.5 mg via RESPIRATORY_TRACT
  Filled 2023-01-31: qty 2.5

## 2023-01-31 MED ORDER — LEVALBUTEROL HCL 0.63 MG/3ML IN NEBU
0.6300 mg | INHALATION_SOLUTION | Freq: Two times a day (BID) | RESPIRATORY_TRACT | Status: DC
  Administered 2023-02-01 – 2023-02-04 (×6): 0.63 mg via RESPIRATORY_TRACT
  Filled 2023-01-31 (×7): qty 3

## 2023-01-31 MED ORDER — BUDESONIDE 0.25 MG/2ML IN SUSP
0.2500 mg | Freq: Two times a day (BID) | RESPIRATORY_TRACT | Status: DC
  Administered 2023-01-31 – 2023-02-01 (×2): 0.25 mg via RESPIRATORY_TRACT
  Filled 2023-01-31 (×3): qty 2

## 2023-01-31 MED ORDER — OXYCODONE HCL 5 MG PO TABS
5.0000 mg | ORAL_TABLET | ORAL | Status: DC | PRN
  Administered 2023-01-31 – 2023-02-01 (×5): 5 mg via ORAL
  Filled 2023-01-31 (×5): qty 1

## 2023-01-31 NOTE — Progress Notes (Signed)
PROGRESS NOTE    Victoria [name]  [id number] DOB: 11-Mar-1941 DOA: 01/24/2023 PCP: Elby Showers, MD   Brief Narrative:  The patient is a 82 year old overweight African-American female with a past medical history significant for but not limited to history of PE and DVT on anticoagulation, diabetes mellitus type 2, pancreatic cancer, hypertension as well as other comorbidities who presented with weakness and shortness of breath. She reports that she had been doing well since her discharge from the hospital for community-acquired pneumonia however over the last 2 days she notes that she had a productive cough and weakness. She did not have any fever, nausea, vomiting, diarrhea or sick contacts. She took some over-the-counter medications prescribed for her cough however did did not help. Her weakness worsened so she decided come to the ED for further evaluation. Her initial chest x-ray showed a patchy bibasilar airspace opacity with a multifocal pneumonia versus asymmetrical pulmonary edema noted and so she was given treatment with IV antibiotics and started on diuresis. She started improving with diuresis and her antibiotics were changed to oral medications.    She was planning to be discharged yesterday however she acutely worsen and became more dyspneic.  Pulm is consulted and recommended no changes to plan of care.  Today she continues to be dyspneic and desaturated on ambulatory home O2 screen.  Given her multiple medical comorbidities palliative has been consulted for further evaluation recommendations. DG Chest X-Ray done and showed "Bilateral lower lobe airspace disease and mild heterogeneous upper lobe airspace disease concerning for multilobar pneumonia. Right lower lobe airspace disease has improved compared with 01/24/2023. No pleural effusion or pneumothorax. Heart and mediastinal contours are unremarkable."  Assessment and Plan:  Dyspnea 2/2 Multifocal pneumonia with Acute Respiratory  Failure with Hypoxia and likely concomitant Acute Diastolic CHF Exacerbation. -Patient presented with generalized weakness, shortness of breath. -Patient noted to have recently hospitalized and treated for a atypical pneumonia with 5 days of IV Rocephin and IV azithromycin. -BNP within normal limits. -Procalcitonin negative. -MRSA PCR negative. -Blood cultures pending with no growth to date x 5 Days. -SpO2: 100 % O2 Flow Rate (L/min): 2 L/min -Respiratory viral panel negative. -SARS coronavirus PCR negative, influenza A and B PCR negative, respiratory syncytial virus negative. -Discontinued IV Vancomycin and Continue IV Cefepime.   -2D echo with EF 60 to 65%, NWMA, grade 1 diastolic dysfunction.   -IV Lasix has not been stopped and she has been transitioned to oral Lasix -Given her continued dyspnea we have added Xopenex and Atrovent scheduled every 6 as well as budesonide and Brovana as well as increasing the guaifenesin to 1200 mg p.o. twice daily and adding a flutter valve today incentive spirometry -Omnicef now discontinued -Strict I's and O's, daily weights. -Repeat CXR today done and showed "Bilateral lower lobe airspace disease and mild heterogeneous upper lobe airspace disease concerning for multilobar pneumonia. Right  lower lobe airspace disease has improved  compared with 01/24/2023. No pleural effusion or pneumothorax. Heart and mediastinal contours are unremarkable." -Patient felt more dyspneic yesterday and given her worsening chest x-ray we have consulted pulmonary for further evaluation; pulmonary evaluated and she is recently been treated for pneumonia as well as decompensated diastolic heart failure with her infectious workup being negative and blood cultures being negative pulmonary had no really further recommendations and recommended no changes to current management but we have made further changes as above -Ambulatory home O2 screen done and she did desaturate on ambulation  so will be provided  2 L of supplemental oxygen -Palliative care consulted for further goals of care discussion and symptom management given that she continues to be dyspneic   Acute Diastolic CHF Exacerbation/Volume Overload -Patient had shortness of breath, initial concern was for multifocal pneumonia. -Patient placed on IV Lasix with good urine output and clinical improvement with diuresis. -2D echo  with EF 60 to 65%, NWMA, grade 1 diastolic dysfunction. -IV Lasix has been transitioned to oral Lasix 20 mg daily now -Strict I's and O's and Daily Weights; Patient is -9 lbs since 01/24/23 and is -2.760 Liters since Admission  -DG Chest X-Ray done and showed "Normal cardiac silhouette. Patchy bilateral airspace opacities appears slightly improved. No focal consolidation. No pneumothorax. No acute osseous abnormality." -May benefit from outpatient follow-up with Cardiology.   Hypertension -Continue home regimen of Losartan but will discontinue HCTZ. -He was transitioned off of IV Lasix to p.o. Lasix 20 mg daily now -Will purse discontinuation of HCTZ since the patient is being transitioned to oral Furosemide -Continue to Monitor BP per Protocol -C/w Hydralazine 10 mg IV q8hprn for HBP  -Last BP Reading was 117/70   History of DVT/PE -Continue Anticoagulation with Apixaban 5 mg po BID   Diabetes Mellitus Type 2 -Hemoglobin A1c 6.7 (12/06/2022) -Continue to hold oral hypoglycemic agents.   -Continue Moderate Novolog SSI AC/HS. -CBG's ranging from 118-295   Abnormal LFTs in setting of history of hepatitis C liver cirrhosis -LFT Trend: Recent Labs  Lab 01/15/23 0446 01/16/23 0442 01/24/23 1045 01/25/23 0559 01/29/23 0919 01/30/23 0505 01/31/23 0957  AST 37 46* 31 26 47* 81* 68*  ALT 26 32 23 20 33 47* 47*  BILITOT 0.4 0.7 0.9 0.6 1.0 1.1 1.2  ALKPHOS 81 88 130* 105 145* 188* 186*  -Check right upper quadrant ultrasound and showed "Status post cholecystectomy. Findings consistent  with hepatic cirrhosis. No definite focal sonographic hepatic abnormality seen." -She follows up with [facility] and has a known history of cirrhosis -We will discuss the case with her Gastroenterologist in the morning and if stable likely can be discharged   Normocytic Anemia -Anemia panel consistent with anemia of chronic disease and showed an iron level of 87, UIBC of 308, TIBC 395, saturation ratios of 22% -Patient was complains of some generalized weakness, denies any overt bleeding. -C/w Ferrous Sulfate tab 325 mg po Daily  -Due to current presentation with shortness of breath, concern for volume overload she was transfused 1 unit of PRBC -Hgb/Hct Trend: Recent Labs  Lab 01/26/23 0542 01/27/23 0917 01/28/23 0517 01/28/23 2030 01/29/23 0919 01/30/23 0505 01/31/23 0957  HGB 7.4* 8.0* 7.5* 8.8* 9.3* 9.9* 9.3*  HCT 23.7* 25.8* 24.1* 27.9* 29.2* 31.0* 30.1*  MCV 88.4 89.9 87.6  --  88.8 88.3 90.4  -Follow H&H. -Transfusion threshold hemoglobin < 8. -Continue to monitor for signs and symptoms of bleeding; no overt bleeding noted -Repeat CBC in a.m.   History of Pancreatic Cancer -Outpatient follow-up with Oncology. -Oncology Dr. Burr Medico notified of admission via epic and I discussed the case with Dr. Burr Medico and patient was supposed to have a Port-A-Cath placed however Dr. Burr Medico take care of this in the outpatient setting early next week. -Palliative Care consulted for further [place] discussion    Hypokalemia, improved  -Patient's K+ Level Trend: Recent Labs  Lab 01/25/23 0559 01/26/23 0542 01/27/23 0917 01/28/23 0517 01/29/23 0919 01/30/23 0505 01/31/23 0957  K 3.6 3.3* 3.2* 3.6 3.7 4.2 3.9  -Replete with po Kcl 40 mEQ x1 given  continued Diuresis -Mag Level was 1.9 -Continue to Monitor and Replete as Necessary -Repeat CMP in the AM    Hypoalbuminemia -Patient's Albumin Trend: Recent Labs  Lab 01/15/23 0446 01/16/23 0442 01/24/23 1045 01/25/23 0559  01/29/23 0919 01/30/23 0505 01/31/23 0957  ALBUMIN 2.7* 2.9* 3.0* 2.6* 2.9* 3.0* 2.8*  -Continue to Monitor and Trend and repeat CMP in the AM   Overweight -Complicates overall prognosis and care -Estimated body mass index is 28.66 kg/m as calculated from the following:   Height as of this encounter: 5' 8"$  (1.727 m).   Weight as of this encounter: 85.5 kg.  -Weight Loss and Dietary Counseling given  GERD/GI Prophylaxis -C/w Pantoprazole 40 mg po Daily   DVT prophylaxis:  apixaban (ELIQUIS) tablet 5 mg    Code Status: Full Code Family Communication: No family currently at bedside  Disposition Plan:  Level of care: Telemetry Status is: Inpatient Remains inpatient appropriate because: Continue to be dyspneic and desaturated today.  Continues to feel bad and respiratory status is not really improved for the last day or so we have added some more breathing treatment and consulted palliative care for further goals of care discussion   Consultants:  Discussed with Medical Oncology Pulmonary Palliative Care Medicine  Procedures:  As delineated as above  ECHOCARDIOGRAM IMPRESSIONS     1. Left ventricular ejection fraction, by estimation, is 60 to 65%. The  left ventricle has normal function. The left ventricle has no regional  wall motion abnormalities. There is mild concentric left ventricular  hypertrophy. Left ventricular diastolic  parameters are consistent with Grade I diastolic dysfunction (impaired  relaxation).   2. Right ventricular systolic function is normal. The right ventricular  size is normal. There is normal pulmonary artery systolic pressure.   3. No evidence of mitral valve regurgitation.   4. The aortic valve is grossly normal. Aortic valve regurgitation is not  visualized.   5. The inferior vena cava is normal in size with greater than 50%  respiratory variability, suggesting right atrial pressure of 3 mmHg.   Comparison(s): No significant change from  prior study.   FINDINGS   Left Ventricle: Left ventricular ejection fraction, by estimation, is 60  to 65%. The left ventricle has normal function. The left ventricle has no  regional wall motion abnormalities. The left ventricular internal cavity  size was normal in size. There is   mild concentric left ventricular hypertrophy. Left ventricular diastolic  parameters are consistent with Grade I diastolic dysfunction (impaired  relaxation).   Right Ventricle: The right ventricular size is normal. Right ventricular  systolic function is normal. There is normal pulmonary artery systolic  pressure. The tricuspid regurgitant velocity is 2.71 m/s, and with an  assumed right atrial pressure of 3 mmHg,   the estimated right ventricular systolic pressure is [id number] mmHg.   Left Atrium: Left atrial size was normal in size.   Right Atrium: Right atrial size was normal in size.   Pericardium: There is no evidence of pericardial effusion.   Mitral Valve: No evidence of mitral valve regurgitation.   Tricuspid Valve: Tricuspid valve regurgitation is mild.   Aortic Valve: The aortic valve is grossly normal. Aortic valve  regurgitation is not visualized. Aortic valve mean gradient measures 3.0  mmHg. Aortic valve peak gradient measures 5.6 mmHg. Aortic valve area, by  VTI measures 2.78 cm.   Pulmonic Valve: Pulmonic valve regurgitation is not visualized.   Aorta: The aortic root and ascending aorta are structurally normal, with  no evidence of dilitation.   Venous: The inferior vena cava is normal in size with greater than 50%  respiratory variability, suggesting right atrial pressure of 3 mmHg.   IAS/Shunts: No atrial level shunt detected by color flow Doppler.     LEFT VENTRICLE  PLAX 2D  LVIDd:         3.50 cm     Diastology  LVIDs:         2.40 cm     LV e' medial:    5.33 cm/s  LV PW:         1.10 cm     LV E/e' medial:  12.5  LV IVS:        1.30 cm     LV e' lateral:   7.29 cm/s   LVOT diam:     2.00 cm     LV E/e' lateral: 9.2  LV SV:         61  LV SV Index:   30  LVOT Area:     3.14 cm                               3D Volume EF:  LV Volumes (MOD)           3D EF:        55 %  LV vol d, MOD A2C: 51.5 ml LV EDV:       119 ml  LV vol d, MOD A4C: 71.4 ml LV ESV:       54 ml  LV vol s, MOD A2C: 20.2 ml LV SV:        66 ml  LV vol s, MOD A4C: 27.8 ml  LV SV MOD A2C:     31.3 ml  LV SV MOD A4C:     71.4 ml  LV SV MOD BP:      37.2 ml   RIGHT VENTRICLE  RV S prime:     17.00 cm/s  TAPSE (M-mode): 1.9 cm   LEFT ATRIUM           Index  LA diam:      3.20 cm 1.59 cm/m  LA Vol (A4C): 40.9 ml 20.31 ml/m   AORTIC VALVE                    PULMONIC VALVE  AV Area (Vmax):    2.60 cm     PV Vmax:       0.81 m/s  AV Area (Vmean):   2.56 cm     PV Peak grad:  2.7 mmHg  AV Area (VTI):     2.78 cm  AV Vmax:           118.00 cm/s  AV Vmean:          81.400 cm/s  AV VTI:            0.220 m  AV Peak Grad:      5.6 mmHg  AV Mean Grad:      3.0 mmHg  LVOT Vmax:         97.70 cm/s  LVOT Vmean:        66.400 cm/s  LVOT VTI:          0.195 m  LVOT/AV VTI ratio: 0.89    AORTA  Ao Root diam: 3.40 cm  Ao Asc diam:  3.50 cm   MITRAL VALVE  TRICUSPID VALVE  MV Area (PHT): 3.99 cm    TR Peak grad:   29.4 mmHg  MV Decel Time: 190 msec    TR Vmax:        271.00 cm/s  MV E velocity: 66.80 cm/s  MV A velocity: 95.50 cm/s  SHUNTS  MV E/A ratio:  0.70        Systemic VTI:  0.20 m                             Systemic Diam: 2.00 cm   Antimicrobials:  Anti-infectives (From admission, onward)    Start     Dose/Rate Route Frequency Ordered Stop   01/28/23 1000  cefdinir (OMNICEF) capsule 300 mg        300 mg Oral Every 12 hours 01/28/23 0848 01/29/23 2145   01/26/23 2200  ceFEPIme (MAXIPIME) 2 g in sodium chloride 0.9 % 100 mL IVPB  Status:  Discontinued        2 g 200 mL/hr over 30 Minutes Intravenous Every 12 hours 01/26/23 1012 01/28/23 0848   01/25/23  1500  ceFEPIme (MAXIPIME) 2 g in sodium chloride 0.9 % 100 mL IVPB  Status:  Discontinued        2 g 200 mL/hr over 30 Minutes Intravenous Every 8 hours 01/25/23 1215 01/26/23 1012   01/25/23 1200  vancomycin (VANCOREADY) IVPB 1500 mg/300 mL  Status:  Discontinued        1,500 mg 150 mL/hr over 120 Minutes Intravenous Every 24 hours 01/24/23 1425 01/26/23 0838   01/24/23 2300  ceFEPIme (MAXIPIME) 2 g in sodium chloride 0.9 % 100 mL IVPB  Status:  Discontinued        2 g 200 mL/hr over 30 Minutes Intravenous Every 12 hours 01/24/23 1422 01/25/23 1215   01/24/23 1115  vancomycin (VANCOREADY) IVPB 1500 mg/300 mL        1,500 mg 150 mL/hr over 120 Minutes Intravenous  Once 01/24/23 1103 01/24/23 1405   01/24/23 1100  ceFEPIme (MAXIPIME) 2 g in sodium chloride 0.9 % 100 mL IVPB        2 g 200 mL/hr over 30 Minutes Intravenous  Once 01/24/23 1050 01/24/23 1132       Subjective: Seen and examined at bedside and felt worse and dyspneic today.  Was anxious and heart rate went up when she ambulated to the home O2 screen.  She did not feel as well and continued to be dyspneic on conversation.  No nausea or vomiting.  No other concerns or complaints at this time but was very discouraged.  Objective: Vitals:   01/31/23 0558 01/31/23 1051 01/31/23 1100 01/31/23 1358  BP: 111/72   117/70  Pulse: 100 (!) 146 93 91  Resp: 18   20  Temp: 98.4 F (36.9 C)   98.2 F (36.8 C)  TempSrc: Oral   Oral  SpO2: 96% (!) 86% 95% 100%  Weight:      Height:        Intake/Output Summary (Last 24 hours) at 01/31/2023 1814 Last data filed at 01/30/2023 [id number] Gross per 24 hour  Intake 828 ml  Output --  Net 828 ml   Filed Weights   01/26/23 0500 01/27/23 0500 01/30/23 0500  Weight: 87.6 kg 84.8 kg 85.5 kg   Examination: Physical Exam:  Constitutional: WN/WD chronically ill-appearing African-American female who appears a little dyspneic and slightly uncomfortable Respiratory: Diminished to auscultation  bilaterally  with coarse breath sounds, no wheezing, rales, rhonchi or crackles. Normal respiratory effort and patient is not tachypenic. No accessory muscle use.  Unlabored breathing Cardiovascular: Tachycardic rate but regular rhythm, no murmurs / rubs / gallops. S1 and S2 auscultated. No extremity edema.  Abdomen: Soft, non-tender, distended secondary to body habitus. Bowel sounds positive.  GU: Deferred. Musculoskeletal: No clubbing / cyanosis of digits/nails. No joint deformity upper and lower extremities.  Skin: No rashes, lesions, ulcers on limited skin evaluation. No induration; Warm and dry.  Neurologic: CN 2-12 grossly intact with no focal deficits. Romberg sign and cerebellar reflexes not assessed.  Psychiatric: Normal judgment and insight. Alert and oriented x 3.  Anxious mood and appropriate affect.   Data Reviewed: I have personally reviewed following labs and imaging studies  CBC: Recent Labs  Lab 01/26/23 0542 01/27/23 0917 01/28/23 0517 01/28/23 2030 01/29/23 0919 01/30/23 0505 01/31/23 0957  WBC 7.0 8.3 7.6  --  8.8 9.5 8.8  NEUTROABS 4.8  --   --   --  6.4 7.0 6.8  HGB 7.4* 8.0* 7.5* 8.8* 9.3* 9.9* 9.3*  HCT 23.7* 25.8* 24.1* 27.9* 29.2* 31.0* 30.1*  MCV 88.4 89.9 87.6  --  88.8 88.3 90.4  PLT 200 258 226  --  239 263 0000000   Basic Metabolic Panel: Recent Labs  Lab 01/27/23 0509 01/27/23 0917 01/28/23 0517 01/29/23 0919 01/30/23 0505 01/31/23 0957  NA  --  136 135 136 135 135  K  --  3.2* 3.6 3.7 4.2 3.9  CL  --  97* 98 98 95* 96*  CO2  --  24 26 26 28 29  $ GLUCOSE  --  160* 135* 149* 195* 245*  BUN  --  12 16 22 $ 25* 30*  CREATININE  --  0.91 0.89 0.98 1.09* 1.06*  CALCIUM  --  8.7* 8.6* 8.8* 9.0 8.9  MG 2.1  --  2.0 1.9 2.0 2.0  PHOS  --   --   --  3.9 4.0 4.0   GFR: Estimated Creatinine Clearance: 49.3 mL/min (A) (by C-G formula based on SCr of 1.06 mg/dL (H)). Liver Function Tests: Recent Labs  Lab 01/25/23 0559 01/29/23 0919 01/30/23 0505  01/31/23 0957  AST 26 47* 81* 68*  ALT 20 33 47* 47*  ALKPHOS 105 145* 188* 186*  BILITOT 0.6 1.0 1.1 1.2  PROT 6.5 6.8 6.8 6.7  ALBUMIN 2.6* 2.9* 3.0* 2.8*   No results for input(s): "LIPASE", "AMYLASE" in the last 168 hours. No results for input(s): "AMMONIA" in the last 168 hours. Coagulation Profile: No results for input(s): "INR", "PROTIME" in the last 168 hours. Cardiac Enzymes: No results for input(s): "CKTOTAL", "CKMB", "CKMBINDEX", "TROPONINI" in the last 168 hours. BNP (last 3 results) No results for input(s): "PROBNP" in the last 8760 hours. HbA1C: No results for input(s): "HGBA1C" in the last 72 hours. CBG: Recent Labs  Lab 01/30/23 1621 01/30/23 2150 01/31/23 0726 01/31/23 1116 01/31/23 1618  GLUCAP 118* 182* 180* 295* 160*   Lipid Profile: No results for input(s): "CHOL", "HDL", "LDLCALC", "TRIG", "CHOLHDL", "LDLDIRECT" in the last 72 hours. Thyroid Function Tests: No results for input(s): "TSH", "T4TOTAL", "FREET4", "T3FREE", "THYROIDAB" in the last 72 hours. Anemia Panel: No results for input(s): "VITAMINB12", "FOLATE", "FERRITIN", "TIBC", "IRON", "RETICCTPCT" in the last 72 hours. Sepsis Labs: Recent Labs  Lab 01/24/23 [place] <0.10    Recent Results (from the past 240 hour(s))  Resp panel by RT-PCR (RSV, Flu A&B, Covid) Anterior Nasal  Swab     Status: None   Collection Time: 01/24/23 10:15 AM   Specimen: Anterior Nasal Swab  Result Value Ref Range Status   SARS Coronavirus 2 by RT PCR NEGATIVE NEGATIVE Final    Comment: (NOTE) SARS-CoV-2 target nucleic acids are NOT DETECTED.  The SARS-CoV-2 RNA is generally detectable in upper respiratory specimens during the acute phase of infection. The lowest concentration of SARS-CoV-2 viral copies this assay can detect is 138 copies/mL. A negative result does not preclude SARS-Cov-2 infection and should not be used as the sole basis for treatment or other patient management decisions. A  negative result may occur with  improper specimen collection/handling, submission of specimen other than nasopharyngeal swab, presence of viral mutation(s) within the areas targeted by this assay, and inadequate number of viral copies(<138 copies/mL). A negative result must be combined with clinical observations, patient history, and epidemiological information. The expected result is Negative.  Fact Sheet for Patients:  EntrepreneurPulse.com.au  Fact Sheet for Healthcare Providers:  IncredibleEmployment.be  This test is no t yet approved or cleared by the [place] FDA and  has been authorized for detection and/or diagnosis of SARS-CoV-2 by FDA under an Emergency Use Authorization (EUA). This EUA will remain  in effect (meaning this test can be used) for the duration of the COVID-19 declaration under Section 564(b)(1) of the Act, 21 U.S.C.section 360bbb-3(b)(1), unless the authorization is terminated  or revoked sooner.       Influenza A by PCR NEGATIVE NEGATIVE Final   Influenza B by PCR NEGATIVE NEGATIVE Final    Comment: (NOTE) The Xpert Xpress SARS-CoV-2/FLU/RSV plus assay is intended as an aid in the diagnosis of influenza from Nasopharyngeal swab specimens and should not be used as a sole basis for treatment. Nasal washings and aspirates are unacceptable for Xpert Xpress SARS-CoV-2/FLU/RSV testing.  Fact Sheet for Patients: EntrepreneurPulse.com.au  Fact Sheet for Healthcare Providers: IncredibleEmployment.be  This test is not yet approved or cleared by the [place] FDA and has been authorized for detection and/or diagnosis of SARS-CoV-2 by FDA under an Emergency Use Authorization (EUA). This EUA will remain in effect (meaning this test can be used) for the duration of the COVID-19 declaration under Section 564(b)(1) of the Act, 21 U.S.C. section 360bbb-3(b)(1), unless the authorization  is terminated or revoked.     Resp Syncytial Virus by PCR NEGATIVE NEGATIVE Final    Comment: (NOTE) Fact Sheet for Patients: EntrepreneurPulse.com.au  Fact Sheet for Healthcare Providers: IncredibleEmployment.be  This test is not yet approved or cleared by the [place] FDA and has been authorized for detection and/or diagnosis of SARS-CoV-2 by FDA under an Emergency Use Authorization (EUA). This EUA will remain in effect (meaning this test can be used) for the duration of the COVID-19 declaration under Section 564(b)(1) of the Act, 21 U.S.C. section 360bbb-3(b)(1), unless the authorization is terminated or revoked.  Performed at [facility], [place] [address] [place], [place] [address]   Blood culture (routine x 2)     Status: None   Collection Time: 01/24/23 11:17 AM   Specimen: BLOOD LEFT ARM  Result Value Ref Range Status   Specimen Description   Final    BLOOD LEFT ARM BOTTLES DRAWN AEROBIC AND ANAEROBIC Performed at [facility] [address] [place], [place] [address]    Special Requests   Final    Blood Culture adequate volume Performed at [facility] [address], [place],  [address]  Culture   Final    NO GROWTH 5 DAYS Performed at [facility], [place] [address], [place], [place] [address]    Report Status 01/29/2023 FINAL  Final  Blood culture (routine x 2)     Status: None   Collection Time: 01/24/23 11:25 AM   Specimen: BLOOD LEFT HAND  Result Value Ref Range Status   Specimen Description BLOOD LEFT HAND  Final   Special Requests   Final    BOTTLES DRAWN AEROBIC AND ANAEROBIC Blood Culture adequate volume   Culture   Final    NO GROWTH 5 DAYS Performed at [facility], [place] [address] [place], [place] [address]    Report Status 01/29/2023 FINAL  Final  Respiratory (~20 pathogens) panel by PCR     Status: None   Collection Time:  01/24/23 12:33 PM   Specimen: Nasopharyngeal Swab; Respiratory  Result Value Ref Range Status   Adenovirus NOT DETECTED NOT DETECTED Final   Coronavirus 229E NOT DETECTED NOT DETECTED Final    Comment: (NOTE) The Coronavirus on the Respiratory Panel, DOES NOT test for the novel  Coronavirus (2019 nCoV)    Coronavirus HKU1 NOT DETECTED NOT DETECTED Final   Coronavirus NL63 NOT DETECTED NOT DETECTED Final   Coronavirus OC43 NOT DETECTED NOT DETECTED Final   Metapneumovirus NOT DETECTED NOT DETECTED Final   Rhinovirus / Enterovirus NOT DETECTED NOT DETECTED Final   Influenza A NOT DETECTED NOT DETECTED Final   Influenza B NOT DETECTED NOT DETECTED Final   Parainfluenza Virus 1 NOT DETECTED NOT DETECTED Final   Parainfluenza Virus 2 NOT DETECTED NOT DETECTED Final   Parainfluenza Virus 3 NOT DETECTED NOT DETECTED Final   Parainfluenza Virus 4 NOT DETECTED NOT DETECTED Final   Respiratory Syncytial Virus NOT DETECTED NOT DETECTED Final   Bordetella pertussis NOT DETECTED NOT DETECTED Final   Bordetella Parapertussis NOT DETECTED NOT DETECTED Final   Chlamydophila pneumoniae NOT DETECTED NOT DETECTED Final   Mycoplasma pneumoniae NOT DETECTED NOT DETECTED Final    Comment: Performed at [facility], [place]. [address] [address], [place], [place] [address]  MRSA Next Gen by PCR, Nasal     Status: None   Collection Time: 01/25/23  1:15 PM   Specimen: Nasal Mucosa; Nasal Swab  Result Value Ref Range Status   MRSA by PCR Next Gen NOT DETECTED NOT DETECTED Final    Comment: (NOTE) The GeneXpert MRSA Assay (FDA approved for NASAL specimens only), is one component of a comprehensive MRSA colonization surveillance program. It is not intended to diagnose MRSA infection nor to guide or monitor treatment for MRSA infections. Test performance is not FDA approved in patients less than 5 years old. Performed at [facility], [place] [address] [place], [place] [address]     Radiology  Studies: DG CHEST PORT 1 VIEW  Result Date: 01/31/2023 CLINICAL DATA:  Shortness of breath, dyspnea EXAM: PORTABLE CHEST 1 VIEW COMPARISON:  01/30/2023 FINDINGS: Bilateral lower lobe airspace disease and mild heterogeneous upper lobe airspace disease concerning for multilobar pneumonia. Right lower lobe airspace disease has improved compared with 01/24/2023. No pleural effusion or pneumothorax. Heart and mediastinal contours are unremarkable. No acute osseous abnormality. Osteoarthritis of bilateral glenohumeral joints. IMPRESSION: 1. Bilateral lower lobe airspace disease and mild heterogeneous upper lobe airspace disease concerning for multilobar pneumonia. Electronically Signed   By: Kathreen Devoid M.D.   On: 01/31/2023 12:20   US Abdomen Limited RUQ (LIVER/GB)  Result Date: 01/30/2023  CLINICAL DATA:  Abnormal liver function tests. EXAM: ULTRASOUND ABDOMEN LIMITED RIGHT UPPER QUADRANT COMPARISON:  October 03, 2014.  November 14, 2014. FINDINGS: Gallbladder: Status post cholecystectomy. Common bile duct: Diameter: 4 mm which is within normal limits. Liver: No focal lesion identified. Nodular hepatic contours are noted suggesting cirrhosis. Heterogeneous echotexture of hepatic parenchyma is noted. Portal vein is patent on color Doppler imaging with normal direction of blood flow towards the liver. Other: None. IMPRESSION: Status post cholecystectomy. Findings consistent with hepatic cirrhosis. No definite focal sonographic hepatic abnormality seen. Electronically Signed   By: Marijo Conception M.D.   On: 01/30/2023 15:30   DG CHEST PORT 1 VIEW  Result Date: 01/30/2023 CLINICAL DATA:  [id number] with shortness of breath. EXAM: PORTABLE CHEST 1 VIEW COMPARISON:  Portable chest yesterday at 9:29 a.m. FINDINGS: 4:55 a.m. There is interval worsening of patchy heterogeneous airspace disease in the lower lung fields. Small pleural effusions appear similar. The upper lung fields are generally clear. There is mild  cardiomegaly without findings of acute CHF. The aorta is tortuous with calcification in the transverse segment. Stable mediastinum. Slight thoracic dextroscoliosis.  No new osseous abnormality. IMPRESSION: Interval worsening of patchy heterogeneous airspace disease in the lower lung fields bilaterally. Small pleural effusions. No findings of acute CHF or other significant change. Electronically Signed   By: Telford Nab M.D.   On: 01/30/2023 07:18    Scheduled Meds:  ALPRAZolam  0.5 mg Oral BID   apixaban  5 mg Oral BID   benzonatate  200 mg Oral TID   ferrous sulfate  325 mg Oral Daily   furosemide  20 mg Oral Daily   guaiFENesin  600 mg Oral BID   insulin aspart  0-15 Units Subcutaneous TID WC   insulin aspart  0-5 Units Subcutaneous QHS   losartan  100 mg Oral Daily   pantoprazole  40 mg Oral Daily   polyethylene glycol  17 g Oral Daily   senna-docusate  1 tablet Oral BID   Continuous Infusions:   LOS: 6 days   Raiford Noble, DO [facility] Available via Epic secure chat 7am-7pm After these hours, please refer to coverage provider listed on [email or web address] 01/31/2023, 6:14 PM

## 2023-01-31 NOTE — Progress Notes (Signed)
I spoke with Victoria [name] and reviewed f/u appt made with Dr Burr Medico for 2/14 at [place].  I also reminded her of her port placement appt on 2/12.  I told her these appts will be listed on her d/c paperwork from the hospital.  All questions were answered.  She verbalized understanding.

## 2023-01-31 NOTE — Care Management Important Message (Signed)
Important Message  Patient Details IM Letter given. Name: Victoria [name] MRN: [id number] Date of Birth: 12-15-1941   Medicare Important Message Given:  Yes     Kerin Salen 01/31/2023, 9:36 AM

## 2023-02-01 ENCOUNTER — Inpatient Hospital Stay (HOSPITAL_COMMUNITY): Payer: Medicare HMO

## 2023-02-01 DIAGNOSIS — G893 Neoplasm related pain (acute) (chronic): Secondary | ICD-10-CM | POA: Diagnosis not present

## 2023-02-01 DIAGNOSIS — J189 Pneumonia, unspecified organism: Secondary | ICD-10-CM | POA: Diagnosis not present

## 2023-02-01 DIAGNOSIS — Z515 Encounter for palliative care: Secondary | ICD-10-CM

## 2023-02-01 DIAGNOSIS — Z7189 Other specified counseling: Secondary | ICD-10-CM

## 2023-02-01 DIAGNOSIS — C259 Malignant neoplasm of pancreas, unspecified: Secondary | ICD-10-CM | POA: Diagnosis not present

## 2023-02-01 DIAGNOSIS — I5031 Acute diastolic (congestive) heart failure: Secondary | ICD-10-CM | POA: Diagnosis not present

## 2023-02-01 DIAGNOSIS — R0602 Shortness of breath: Secondary | ICD-10-CM | POA: Diagnosis not present

## 2023-02-01 DIAGNOSIS — E119 Type 2 diabetes mellitus without complications: Secondary | ICD-10-CM | POA: Diagnosis not present

## 2023-02-01 DIAGNOSIS — R531 Weakness: Secondary | ICD-10-CM

## 2023-02-01 LAB — CBC WITH DIFFERENTIAL/PLATELET
Abs Immature Granulocytes: 0.03 10*3/uL (ref 0.00–0.07)
Basophils Absolute: 0 10*3/uL (ref 0.0–0.1)
Basophils Relative: 0 %
Eosinophils Absolute: 0.2 10*3/uL (ref 0.0–0.5)
Eosinophils Relative: 2 %
HCT: 28.7 % — ABNORMAL LOW (ref 36.0–46.0)
Hemoglobin: 8.9 g/dL — ABNORMAL LOW (ref 12.0–15.0)
Immature Granulocytes: 0 %
Lymphocytes Relative: 12 %
Lymphs Abs: 1 10*3/uL (ref 0.7–4.0)
MCH: 27.7 pg (ref 26.0–34.0)
MCHC: 31 g/dL (ref 30.0–36.0)
MCV: 89.4 fL (ref 80.0–100.0)
Monocytes Absolute: 0.8 10*3/uL (ref 0.1–1.0)
Monocytes Relative: 9 %
Neutro Abs: 6.2 10*3/uL (ref 1.7–7.7)
Neutrophils Relative %: 77 %
Platelets: 214 10*3/uL (ref 150–400)
RBC: 3.21 MIL/uL — ABNORMAL LOW (ref 3.87–5.11)
RDW: 17.5 % — ABNORMAL HIGH (ref 11.5–15.5)
WBC: 8.2 10*3/uL (ref 4.0–10.5)
nRBC: 0 % (ref 0.0–0.2)

## 2023-02-01 LAB — GLUCOSE, CAPILLARY
Glucose-Capillary: 177 mg/dL — ABNORMAL HIGH (ref 70–99)
Glucose-Capillary: 207 mg/dL — ABNORMAL HIGH (ref 70–99)
Glucose-Capillary: 147 mg/dL — ABNORMAL HIGH (ref 70–99)
Glucose-Capillary: 157 mg/dL — ABNORMAL HIGH (ref 70–99)

## 2023-02-01 LAB — COMPREHENSIVE METABOLIC PANEL
ALT: 57 U/L — ABNORMAL HIGH (ref 0–44)
AST: 83 U/L — ABNORMAL HIGH (ref 15–41)
Albumin: 2.8 g/dL — ABNORMAL LOW (ref 3.5–5.0)
Alkaline Phosphatase: 207 U/L — ABNORMAL HIGH (ref 38–126)
Anion gap: 11 (ref 5–15)
BUN: 30 mg/dL — ABNORMAL HIGH (ref 8–23)
CO2: 29 mmol/L (ref 22–32)
Calcium: 8.7 mg/dL — ABNORMAL LOW (ref 8.9–10.3)
Chloride: 95 mmol/L — ABNORMAL LOW (ref 98–111)
Creatinine, Ser: 1.02 mg/dL — ABNORMAL HIGH (ref 0.44–1.00)
GFR, Estimated: 56 mL/min — ABNORMAL LOW (ref >60)
Glucose, Bld: 194 mg/dL — ABNORMAL HIGH (ref 70–99)
Potassium: 3.9 mmol/L (ref 3.5–5.1)
Sodium: 135 mmol/L (ref 135–145)
Total Bilirubin: 1.1 mg/dL (ref 0.3–1.2)
Total Protein: 7 g/dL (ref 6.5–8.1)

## 2023-02-01 LAB — PHOSPHORUS: Phosphorus: 4.6 mg/dL (ref 2.5–4.6)

## 2023-02-01 LAB — MAGNESIUM: Magnesium: 2 mg/dL (ref 1.7–2.4)

## 2023-02-01 MED ORDER — MORPHINE SULFATE (PF) 4 MG/ML IV SOLN
4.0000 mg | Freq: Four times a day (QID) | INTRAVENOUS | Status: DC | PRN
  Administered 2023-02-01 – 2023-02-04 (×6): 4 mg via INTRAVENOUS
  Filled 2023-02-01 (×5): qty 1

## 2023-02-01 MED ORDER — OXYCODONE HCL 5 MG PO TABS
5.0000 mg | ORAL_TABLET | ORAL | Status: DC | PRN
  Administered 2023-02-01: 5 mg via ORAL
  Administered 2023-02-02: 10 mg via ORAL
  Administered 2023-02-03: 5 mg via ORAL
  Administered 2023-02-03: 10 mg via ORAL
  Filled 2023-02-01 (×2): qty 1
  Filled 2023-02-01 (×2): qty 2

## 2023-02-01 MED ORDER — MORPHINE SULFATE ER 15 MG PO TBCR: 15.0000 mg | EXTENDED_RELEASE_TABLET | Freq: Two times a day (BID) | ORAL | 0 refills | Status: DC

## 2023-02-01 MED ORDER — MORPHINE SULFATE ER 15 MG PO TBCR
15.0000 mg | EXTENDED_RELEASE_TABLET | Freq: Two times a day (BID) | ORAL | Status: DC
  Administered 2023-02-01 – 2023-02-04 (×6): 15 mg via ORAL
  Filled 2023-02-01 (×6): qty 1

## 2023-02-01 MED ORDER — OXYCODONE HCL 5 MG PO TABS: 5.0000 mg | ORAL_TABLET | ORAL | 0 refills | Status: DC | PRN

## 2023-02-01 MED ORDER — BUDESONIDE 0.5 MG/2ML IN SUSP
0.5000 mg | Freq: Two times a day (BID) | RESPIRATORY_TRACT | Status: DC
  Administered 2023-02-01 – 2023-02-04 (×5): 0.5 mg via RESPIRATORY_TRACT
  Filled 2023-02-01 (×6): qty 2

## 2023-02-01 NOTE — Progress Notes (Signed)
PROGRESS NOTE    Victoria [name]  [id number] DOB: [birth year] DOA: 01/24/2023 PCP: Elby Showers, MD   Brief Narrative:  The patient is a 82 year old overweight African-American female with a past medical history significant for but not limited to history of PE and DVT on anticoagulation, diabetes mellitus type 2, pancreatic cancer, hypertension as well as other comorbidities who presented with weakness and shortness of breath. She reports that she had been doing well since her discharge from the hospital for community-acquired pneumonia however over the last 2 days she notes that she had a productive cough and weakness. She did not have any fever, nausea, vomiting, diarrhea or sick contacts. She took some over-the-counter medications prescribed for her cough however did did not help. Her weakness worsened so she decided come to the ED for further evaluation. Her initial chest x-ray showed a patchy bibasilar airspace opacity with a multifocal pneumonia versus asymmetrical pulmonary edema noted and so she was given treatment with IV antibiotics and started on diuresis. She started improving with diuresis and her antibiotics were changed to oral medications.     She was planning to be discharged yesterday however she acutely worsen and became more dyspneic.  Pulm is consulted and recommended no changes to plan of care.  Today she continues to be dyspneic and desaturated on ambulatory home O2 screen.  Given her multiple medical comorbidities palliative has been consulted for further evaluation recommendations.   DG Chest X-Ray done and showed "No significant change in diffuse interstitial prominence and mild bibasilar airspace disease.".  Palliative care consulted and making adjustments for pain regimen  Assessment and Plan:  Dyspnea 2/2 Multifocal pneumonia with Acute Respiratory Failure with Hypoxia and likely concomitant Acute Diastolic CHF Exacerbation. -Patient presented with generalized  weakness, shortness of breath. -Patient noted to have recently hospitalized and treated for a atypical pneumonia with 5 days of IV Rocephin and IV azithromycin. -BNP within normal limits. -Procalcitonin negative. -MRSA PCR negative. -Blood cultures pending with no growth to date x 5 Days. -SpO2: 94 % O2 Flow Rate (L/min): 2 L/min (down to 1 lpm post neb) -Respiratory viral panel negative. -SARS coronavirus PCR negative, influenza A and B PCR negative, respiratory syncytial virus negative. -Discontinued IV Vancomycin and Continue IV Cefepime and oral Abx now stopped.  -2D echo with EF 60 to 65%, NWMA, grade 1 diastolic dysfunction.   -IV Lasix has not been stopped and she has been transitioned to oral Lasix -Given her continued dyspnea we have added Xopenex and Atrovent scheduled every 6 as well as Budesonide and Brovana as well as increasing the guaifenesin to 1200 mg p.o. twice daily and adding a flutter valve today incentive spirometry; now the budesonide has increased from 0.25 mg to 0.5 mg twice daily -Omnicef now discontinued -Strict I's and O's, daily weights. -Repeat CXR today done and showed "No significant change in diffuse interstitial prominence and mild bibasilar airspace disease." -Continues to feel dyspenic and given her worsening chest x-ray we have consulted pulmonary for further evaluation; pulmonary evaluated and she is recently been treated for pneumonia as well as decompensated diastolic heart failure with her infectious workup being negative and blood cultures being negative pulmonary had no really further recommendations and recommended no changes to current management but we have made further changes as above -Ambulatory home O2 screen done and she did desaturate on ambulation so will be provided 2 L of supplemental oxygen -Palliative care consulted for further goals of care discussion and symptom management given  that she continues to be dyspneic and they are recommending  continuing current plan of care -Repeat CXR in the AM    Acute Diastolic CHF Exacerbation/Volume Overload -Patient had shortness of breath, initial concern was for multifocal pneumonia. -Patient placed on IV Lasix with good urine output and clinical improvement with diuresis. -2D echo  with EF 60 to 65%, NWMA, grade 1 diastolic dysfunction. -IV Lasix has been transitioned to oral Lasix 20 mg daily now -Strict I's and O's and Daily Weights; Patient is -9 lbs since 01/24/23 and is -391 mLiters since Admission  -DG Chest X-Ray done yesterday and showed "Normal cardiac silhouette. Patchy bilateral airspace opacities appears slightly improved. No focal consolidation. No pneumothorax. No acute osseous abnormality." -CXR this AM done and showed "No significant change in diffuse interstitial prominence and mild bibasilar airspace disease." -May benefit from outpatient follow-up with Cardiology.   Hypertension -Continue home regimen of Losartan but will discontinue HCTZ. -He was transitioned off of IV Lasix to p.o. Lasix 20 mg daily now -Will purse discontinuation of HCTZ since the patient is being transitioned to oral Furosemide -Continue to Monitor BP per Protocol -C/w Hydralazine 10 mg IV q8hprn for HBP  -Last BP Reading was 125/74   History of DVT/PE -Continue Anticoagulation with Apixaban 5 mg po BID   Diabetes Mellitus Type 2 -Hemoglobin A1c 6.7 (12/06/2022) -Continue to hold oral hypoglycemic agents.   -Continue Moderate Novolog SSI AC/HS. -CBG's ranging from 147-207   Abnormal LFTs in setting of history of hepatitis C liver cirrhosis -LFT Trend: Recent Labs  Lab 01/16/23 0442 01/24/23 1045 01/25/23 0559 01/29/23 0919 01/30/23 0505 01/31/23 0957 02/01/23 0602  AST 46* 31 26 47* 81* 68* 83*  ALT 32 23 20 33 47* 47* 57*  BILITOT 0.7 0.9 0.6 1.0 1.1 1.2 1.1  ALKPHOS 88 130* 105 145* 188* 186* 207*  -Check right upper quadrant ultrasound and showed "Status post cholecystectomy.  Findings consistent with hepatic cirrhosis. No definite focal sonographic hepatic abnormality seen." -She follows up with [facility] and has a known history of cirrhosis -May need to discuss with with gastroenterology   Normocytic Anemia -Anemia panel consistent with anemia of chronic disease and showed an iron level of 87, UIBC of 308, TIBC 395, saturation ratios of 22% -Patient was complains of some generalized weakness, denies any overt bleeding. -C/w Ferrous Sulfate tab 325 mg po Daily  -Due to current presentation with shortness of breath, concern for volume overload she was transfused 1 unit of PRBC -Hgb/Hct Trend: Recent Labs  Lab 01/27/23 0917 01/28/23 0517 01/28/23 2030 01/29/23 0919 01/30/23 0505 01/31/23 0957 02/01/23 0602  HGB 8.0* 7.5* 8.8* 9.3* 9.9* 9.3* 8.9*  HCT 25.8* 24.1* 27.9* 29.2* 31.0* 30.1* 28.7*  MCV 89.9 87.6  --  88.8 88.3 90.4 89.4  -Follow H&H. -Transfusion threshold hemoglobin < 8. -Continue to monitor for signs and symptoms of bleeding; no overt bleeding noted -Repeat CBC in a.m.   History of Pancreatic Cancer -Outpatient follow-up with Oncology. -Oncology Dr. Burr Medico notified of admission via epic and I discussed the case with Dr. Burr Medico and patient was supposed to have a Port-A-Cath placed however Dr. Burr Medico take care of this in the outpatient setting early next week. -Palliative Care consulted for further [place] discussion and remains full code with full scope of care with currently continuing current plan of care.  Palliative care to follow-up and support and will see the patient on 02/06/2022.  In terms of her symptom management they are starting  MS Contin 15 mg every 12 hours and recommending continuing Oxy IR 5 to 10 mg every 4 hours as needed for breakthrough pain.   Hypokalemia, improved  -Patient's K+ Level Trend: Recent Labs  Lab 01/26/23 0542 01/27/23 0917 01/28/23 0517 01/29/23 0919 01/30/23 0505 01/31/23 0957 02/01/23 0602  K  3.3* 3.2* 3.6 3.7 4.2 3.9 3.9  -Mag Level was 1.9 -Continue to Monitor and Replete as Necessary -Repeat CMP in the AM    Hypoalbuminemia -Patient's Albumin Trend: Recent Labs  Lab 01/16/23 0442 01/24/23 1045 01/25/23 0559 01/29/23 0919 01/30/23 0505 01/31/23 0957 02/01/23 0602  ALBUMIN 2.9* 3.0* 2.6* 2.9* 3.0* 2.8* 2.8*  -Continue to Monitor and Trend and repeat CMP in the AM    Overweight -Complicates overall prognosis and care -Estimated body mass index is 28.66 kg/m as calculated from the following:   Height as of this encounter: 5' 8"$  (1.727 m).   Weight as of this encounter: 85.5 kg.  -Weight Loss and Dietary Counseling given   GERD/GI Prophylaxis -C/w Pantoprazole 40 mg po Daily    DVT prophylaxis:  apixaban (ELIQUIS) tablet 5 mg    Code Status: Full Code Family Communication: Discussed with family at bedside  Disposition Plan:  Level of care: Telemetry Status is: Inpatient Remains inpatient appropriate because: Needs further clinical improvement and she is being transition from IV pain medication to oral medications in preparation for discharge   Consultants:  Palliative care medicine Pulmonary Discussed with her primary oncologist Dr. Burr Medico  Procedures:  As delineated as above  Antimicrobials:  Anti-infectives (From admission, onward)    Start     Dose/Rate Route Frequency Ordered Stop   01/28/23 1000  cefdinir (OMNICEF) capsule 300 mg        300 mg Oral Every 12 hours 01/28/23 0848 01/29/23 2145   01/26/23 2200  ceFEPIme (MAXIPIME) 2 g in sodium chloride 0.9 % 100 mL IVPB  Status:  Discontinued        2 g 200 mL/hr over 30 Minutes Intravenous Every 12 hours 01/26/23 1012 01/28/23 0848   01/25/23 1500  ceFEPIme (MAXIPIME) 2 g in sodium chloride 0.9 % 100 mL IVPB  Status:  Discontinued        2 g 200 mL/hr over 30 Minutes Intravenous Every 8 hours 01/25/23 1215 01/26/23 1012   01/25/23 1200  vancomycin (VANCOREADY) IVPB 1500 mg/300 mL  Status:   Discontinued        1,500 mg 150 mL/hr over 120 Minutes Intravenous Every 24 hours 01/24/23 1425 01/26/23 0838   01/24/23 2300  ceFEPIme (MAXIPIME) 2 g in sodium chloride 0.9 % 100 mL IVPB  Status:  Discontinued        2 g 200 mL/hr over 30 Minutes Intravenous Every 12 hours 01/24/23 1422 01/25/23 1215   01/24/23 1115  vancomycin (VANCOREADY) IVPB 1500 mg/300 mL        1,500 mg 150 mL/hr over 120 Minutes Intravenous  Once 01/24/23 1103 01/24/23 1405   01/24/23 1100  ceFEPIme (MAXIPIME) 2 g in sodium chloride 0.9 % 100 mL IVPB        2 g 200 mL/hr over 30 Minutes Intravenous  Once 01/24/23 1050 01/24/23 1132       Subjective: Seen and examined at bedside and she was still feeling a little short of breath.  Continue to have some uncontrolled pain.  No nausea or vomiting.  Feels okay.  No other concerns or complaints this time and been weaned off of supplemental oxygen  for now.  Objective: Vitals:   01/31/23 1959 02/01/23 0429 02/01/23 0816 02/01/23 1303  BP: 122/88 125/77  125/74  Pulse: 95 96  (!) 102  Resp: 16 16  20  $ Temp: 98.2 F (36.8 C) 97.9 F (36.6 C)  98.4 F (36.9 C)  TempSrc: Oral Oral  Oral  SpO2: 100% 100% 97% 94%  Weight:      Height:        Intake/Output Summary (Last 24 hours) at 02/01/2023 1817 Last data filed at 02/01/2023 1700 Gross per 24 hour  Intake 713 ml  Output --  Net 713 ml   Filed Weights   01/26/23 0500 01/27/23 0500 01/30/23 0500  Weight: 87.6 kg 84.8 kg 85.5 kg   Examination: Physical Exam:  Constitutional: WN/WD overweight African-American female currently no acute distress appears a little uncomfortable Respiratory: Diminished to auscultation bilaterally with coarse breath sounds and has some rhonchi and some mild crackles.  No appreciable wheezing or rales. Normal respiratory effort and patient is not tachypenic. No accessory muscle use.  Wearing supplemental oxygen nasal cannula Cardiovascular: RRR, no murmurs / rubs / gallops. S1 and  S2 auscultated. No extremity edema.  Abdomen: Soft, non-tender, distended secondary body habitus. Bowel sounds positive.  GU: Deferred. Musculoskeletal: No clubbing / cyanosis of digits/nails. No joint deformity upper and lower extremities.  Skin: No rashes, lesions, ulcers or skin evaluation. No induration; Warm and dry.  Neurologic: CN 2-12 grossly intact with no focal deficits. Romberg sign cerebellar reflexes not assessed.  Psychiatric: Normal judgment and insight. Alert and oriented x 3.  Slightly depressed appearing mood.  Data Reviewed: I have personally reviewed following labs and imaging studies  CBC: Recent Labs  Lab 01/26/23 0542 01/27/23 0917 01/28/23 0517 01/28/23 2030 01/29/23 0919 01/30/23 0505 01/31/23 0957 02/01/23 0602  WBC 7.0   < > 7.6  --  8.8 9.5 8.8 8.2  NEUTROABS 4.8  --   --   --  6.4 7.0 6.8 6.2  HGB 7.4*   < > 7.5* 8.8* 9.3* 9.9* 9.3* 8.9*  HCT 23.7*   < > 24.1* 27.9* 29.2* 31.0* 30.1* 28.7*  MCV 88.4   < > 87.6  --  88.8 88.3 90.4 89.4  PLT 200   < > 226  --  239 263 204 214   < > = values in this interval not displayed.   Basic Metabolic Panel: Recent Labs  Lab 01/28/23 0517 01/29/23 0919 01/30/23 0505 01/31/23 0957 02/01/23 0602  NA 135 136 135 135 135  K 3.6 3.7 4.2 3.9 3.9  CL 98 98 95* 96* 95*  CO2 26 26 28 29 29  $ GLUCOSE 135* 149* 195* 245* 194*  BUN 16 22 25* 30* 30*  CREATININE 0.89 0.98 1.09* 1.06* 1.02*  CALCIUM 8.6* 8.8* 9.0 8.9 8.7*  MG 2.0 1.9 2.0 2.0 2.0  PHOS  --  3.9 4.0 4.0 4.6   GFR: Estimated Creatinine Clearance: 51.2 mL/min (A) (by C-G formula based on SCr of 1.02 mg/dL (H)). Liver Function Tests: Recent Labs  Lab 01/29/23 0919 01/30/23 0505 01/31/23 0957 02/01/23 0602  AST 47* 81* 68* 83*  ALT 33 47* 47* 57*  ALKPHOS 145* 188* 186* 207*  BILITOT 1.0 1.1 1.2 1.1  PROT 6.8 6.8 6.7 7.0  ALBUMIN 2.9* 3.0* 2.8* 2.8*   No results for input(s): "LIPASE", "AMYLASE" in the last 168 hours. No results for  input(s): "AMMONIA" in the last 168 hours. Coagulation Profile: No results for input(s): "INR", "PROTIME"  in the last 168 hours. Cardiac Enzymes: No results for input(s): "CKTOTAL", "CKMB", "CKMBINDEX", "TROPONINI" in the last 168 hours. BNP (last 3 results) No results for input(s): "PROBNP" in the last 8760 hours. HbA1C: No results for input(s): "HGBA1C" in the last 72 hours. CBG: Recent Labs  Lab 01/31/23 1618 01/31/23 2146 02/01/23 0742 02/01/23 1148 02/01/23 1643  GLUCAP 160* 138* 177* 207* 147*   Lipid Profile: No results for input(s): "CHOL", "HDL", "LDLCALC", "TRIG", "CHOLHDL", "LDLDIRECT" in the last 72 hours. Thyroid Function Tests: No results for input(s): "TSH", "T4TOTAL", "FREET4", "T3FREE", "THYROIDAB" in the last 72 hours. Anemia Panel: No results for input(s): "VITAMINB12", "FOLATE", "FERRITIN", "TIBC", "IRON", "RETICCTPCT" in the last 72 hours. Sepsis Labs: No results for input(s): "PROCALCITON", "LATICACIDVEN" in the last 168 hours.  Recent Results (from the past 240 hour(s))  Resp panel by RT-PCR (RSV, Flu A&B, Covid) Anterior Nasal Swab     Status: None   Collection Time: 01/24/23 10:15 AM   Specimen: Anterior Nasal Swab  Result Value Ref Range Status   SARS Coronavirus 2 by RT PCR NEGATIVE NEGATIVE Final    Comment: (NOTE) SARS-CoV-2 target nucleic acids are NOT DETECTED.  The SARS-CoV-2 RNA is generally detectable in upper respiratory specimens during the acute phase of infection. The lowest concentration of SARS-CoV-2 viral copies this assay can detect is 138 copies/mL. A negative result does not preclude SARS-Cov-2 infection and should not be used as the sole basis for treatment or other patient management decisions. A negative result may occur with  improper specimen collection/handling, submission of specimen other than nasopharyngeal swab, presence of viral mutation(s) within the areas targeted by this assay, and inadequate number of  viral copies(<138 copies/mL). A negative result must be combined with clinical observations, patient history, and epidemiological information. The expected result is Negative.  Fact Sheet for Patients:  EntrepreneurPulse.com.au  Fact Sheet for Healthcare Providers:  IncredibleEmployment.be  This test is no t yet approved or cleared by the [place] FDA and  has been authorized for detection and/or diagnosis of SARS-CoV-2 by FDA under an Emergency Use Authorization (EUA). This EUA will remain  in effect (meaning this test can be used) for the duration of the COVID-19 declaration under Section 564(b)(1) of the Act, 21 U.S.C.section 360bbb-3(b)(1), unless the authorization is terminated  or revoked sooner.       Influenza A by PCR NEGATIVE NEGATIVE Final   Influenza B by PCR NEGATIVE NEGATIVE Final    Comment: (NOTE) The Xpert Xpress SARS-CoV-2/FLU/RSV plus assay is intended as an aid in the diagnosis of influenza from Nasopharyngeal swab specimens and should not be used as a sole basis for treatment. Nasal washings and aspirates are unacceptable for Xpert Xpress SARS-CoV-2/FLU/RSV testing.  Fact Sheet for Patients: EntrepreneurPulse.com.au  Fact Sheet for Healthcare Providers: IncredibleEmployment.be  This test is not yet approved or cleared by the [place] FDA and has been authorized for detection and/or diagnosis of SARS-CoV-2 by FDA under an Emergency Use Authorization (EUA). This EUA will remain in effect (meaning this test can be used) for the duration of the COVID-19 declaration under Section 564(b)(1) of the Act, 21 U.S.C. section 360bbb-3(b)(1), unless the authorization is terminated or revoked.     Resp Syncytial Virus by PCR NEGATIVE NEGATIVE Final    Comment: (NOTE) Fact Sheet for Patients: EntrepreneurPulse.com.au  Fact Sheet for Healthcare  Providers: IncredibleEmployment.be  This test is not yet approved or cleared by the [place] FDA and has been authorized for detection and/or diagnosis  of SARS-CoV-2 by FDA under an Emergency Use Authorization (EUA). This EUA will remain in effect (meaning this test can be used) for the duration of the COVID-19 declaration under Section 564(b)(1) of the Act, 21 U.S.C. section 360bbb-3(b)(1), unless the authorization is terminated or revoked.  Performed at [facility], [place] [address], [place], [place] [address]   Blood culture (routine x 2)     Status: None   Collection Time: 01/24/23 11:17 AM   Specimen: BLOOD LEFT ARM  Result Value Ref Range Status   Specimen Description   Final    BLOOD LEFT ARM BOTTLES DRAWN AEROBIC AND ANAEROBIC Performed at [facility] [address] [place], [place] [address]    Special Requests   Final    Blood Culture adequate volume Performed at [facility] [address] [place], [place] [address]    Culture   Final    NO GROWTH 5 DAYS Performed at [facility], [place] [address] [place], [place] [address]    Report Status 01/29/2023 FINAL  Final  Blood culture (routine x 2)     Status: None   Collection Time: 01/24/23 11:25 AM   Specimen: BLOOD LEFT HAND  Result Value Ref Range Status   Specimen Description BLOOD LEFT HAND  Final   Special Requests   Final    BOTTLES DRAWN AEROBIC AND ANAEROBIC Blood Culture adequate volume   Culture   Final    NO GROWTH 5 DAYS Performed at [facility], [place] [address] [place], [place] [address]    Report Status 01/29/2023 FINAL  Final  Respiratory (~20 pathogens) panel by PCR     Status: None   Collection Time: 01/24/23 12:33 PM   Specimen: Nasopharyngeal Swab; Respiratory  Result Value Ref Range Status   Adenovirus NOT DETECTED NOT DETECTED Final   Coronavirus 229E NOT DETECTED NOT DETECTED Final    Comment:  (NOTE) The Coronavirus on the Respiratory Panel, DOES NOT test for the novel  Coronavirus (2019 nCoV)    Coronavirus HKU1 NOT DETECTED NOT DETECTED Final   Coronavirus NL63 NOT DETECTED NOT DETECTED Final   Coronavirus OC43 NOT DETECTED NOT DETECTED Final   Metapneumovirus NOT DETECTED NOT DETECTED Final   Rhinovirus / Enterovirus NOT DETECTED NOT DETECTED Final   Influenza A NOT DETECTED NOT DETECTED Final   Influenza B NOT DETECTED NOT DETECTED Final   Parainfluenza Virus 1 NOT DETECTED NOT DETECTED Final   Parainfluenza Virus 2 NOT DETECTED NOT DETECTED Final   Parainfluenza Virus 3 NOT DETECTED NOT DETECTED Final   Parainfluenza Virus 4 NOT DETECTED NOT DETECTED Final   Respiratory Syncytial Virus NOT DETECTED NOT DETECTED Final   Bordetella pertussis NOT DETECTED NOT DETECTED Final   Bordetella Parapertussis NOT DETECTED NOT DETECTED Final   Chlamydophila pneumoniae NOT DETECTED NOT DETECTED Final   Mycoplasma pneumoniae NOT DETECTED NOT DETECTED Final    Comment: Performed at [facility], [place]. [address] [place],  [address]  MRSA Next Gen by PCR, Nasal     Status: None   Collection Time: 01/25/23  1:15 PM   Specimen: Nasal Mucosa; Nasal Swab  Result Value Ref Range Status   MRSA by PCR Next Gen NOT DETECTED NOT DETECTED Final    Comment: (NOTE) The GeneXpert MRSA Assay (FDA approved for NASAL specimens only), is one component of a comprehensive MRSA colonization surveillance program. It is not intended to diagnose MRSA infection nor to  guide or monitor treatment for MRSA infections. Test performance is not FDA approved in patients less than 14 years old. Performed at [facility], [place] [address], [place], [place] [address]     Radiology Studies: DG CHEST PORT 1 VIEW  Result Date: 02/01/2023 CLINICAL DATA:  Shortness of breath.  Pneumonia. EXAM: PORTABLE CHEST 1 VIEW COMPARISON:  01/31/2023 FINDINGS: Heart size is stable. Diffuse  interstitial prominence noted. Mild airspace disease is seen in both lower lungs, without significant change. No evidence of pleural effusion. IMPRESSION: No significant change in diffuse interstitial prominence and mild bibasilar airspace disease. Electronically Signed   By: Marlaine Hind M.D.   On: 02/01/2023 09:03   DG CHEST PORT 1 VIEW  Result Date: 01/31/2023 CLINICAL DATA:  Shortness of breath, dyspnea EXAM: PORTABLE CHEST 1 VIEW COMPARISON:  01/30/2023 FINDINGS: Bilateral lower lobe airspace disease and mild heterogeneous upper lobe airspace disease concerning for multilobar pneumonia. Right lower lobe airspace disease has improved compared with 01/24/2023. No pleural effusion or pneumothorax. Heart and mediastinal contours are unremarkable. No acute osseous abnormality. Osteoarthritis of bilateral glenohumeral joints. IMPRESSION: 1. Bilateral lower lobe airspace disease and mild heterogeneous upper lobe airspace disease concerning for multilobar pneumonia. Electronically Signed   By: Kathreen Devoid M.D.   On: 01/31/2023 12:20    Scheduled Meds:  ALPRAZolam  0.5 mg Oral BID   apixaban  5 mg Oral BID   arformoterol  15 mcg Nebulization BID   benzonatate  200 mg Oral TID   budesonide (PULMICORT) nebulizer solution  0.25 mg Nebulization BID   ferrous sulfate  325 mg Oral Daily   furosemide  20 mg Oral Daily   guaiFENesin  1,200 mg Oral BID   insulin aspart  0-15 Units Subcutaneous TID WC   insulin aspart  0-5 Units Subcutaneous QHS   ipratropium  0.5 mg Nebulization BID   levalbuterol  0.63 mg Nebulization BID   losartan  100 mg Oral Daily   morphine  15 mg Oral Q12H   pantoprazole  40 mg Oral Daily   polyethylene glycol  17 g Oral Daily   senna-docusate  1 tablet Oral BID   Continuous Infusions:   LOS: 7 days   Raiford Noble, DO [facility] Available via Epic secure chat 7am-7pm After these hours, please refer to coverage provider listed on [email or web address] 02/01/2023, 6:17 PM

## 2023-02-01 NOTE — Consult Note (Addendum)
Palliative Care Consult Note                                  Date: 02/01/2023   Patient Name: Victoria [name]  DOB: 09-25-1941  MRN: [id number]  Age / Sex: 82 y.o., female  PCP: Elby Showers, MD Referring Physician: Kerney Elbe, DO  Reason for Consultation: Establishing goals of care, Non pain symptom management, and Pain control  HPI/Patient Profile: Palliative Care consult requested for goals of care discussion in this 82 y.o. female  with medical history significant for hepatitis C, right breast cancer s/p lumpectomy, type 2 diabetes, hypertension, thrombocytopenia, PE/DVT, and recent diagnosis of pancreatic cancer (12/2022) followed by Dr. Burr Medico with plans to initiate single agent treatment.  She was admitted on from home 01/24/2023 with shortness of breath and weakness.  Chest x-ray showed multifocal pneumonia versus pulmonary edema.  Patient was initially started on IV antibiotics and diuretics.  Past Medical History:  Diagnosis Date   Abnormal finding on Pap smear    Allergy    SEASONAL   Anxiety    Breast cancer ([place]) 2000   right breast lumptectomy, radiation done   Cirrhosis ([place]) 11/2012   resolved per pt   Diabetes mellitus type 2, controlled ([place])    Hepatitis C    took tx for    Hypertension    Obesity    Thrombocytopenia ([place]) 11/2012     Subjective:   This NP Victoria [name] reviewed medical records, received report from team, assessed the patient and then met at the patient's bedside with Victoria [name] and her brother Victoria [name] to discuss diagnosis, prognosis, GOC, EOL wishes disposition and options.  Patient is resting comfortably in bed watching TV.  No acute distress noted.  Alert and able to engage appropriately in discussions.   Concept of Palliative Care was introduced as specialized medical care for people and their families living with serious illness.  It focuses on providing relief from the symptoms and  stress of a serious illness.  The goal is to improve quality of life for both the patient and the family. Values and goals of care important to patient and family were attempted to be elicited.  I created space and opportunity for patient and family to explore state of health prior to admission, thoughts, and feelings.   Victoria [name] lives at home by herself.  She has 3 children.  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  Patient reports prior to recent admissions over the past several months she was active and independent of all ADLs.  Driving without difficulty.  Does endorse decrease in appetite.  Significant weight loss of approximately 50 pounds over the past 6-8 months.  We discussed Her current illness and what it means in the larger context of Her on-going co-morbidities. Natural disease trajectory and expectations were discussed.  Patient and family verbalized understanding of current illness and co-morbidities.  She shares she came into the hospital what she thought was gas her abdominal pain with no expectation of being diagnosed with cancer.  Emotional support provided.  Patient is clear in her expressed wishes to continue to treat the treatable allow her every opportunity to continue to thrive.  Wishes to take things one day at a time.  She is anxiously awaiting further follow-up with oncology and to start potential treatment.  I reviewed upcoming appointments with patient including Port-A-Cath placement scheduled  for Monday 2/12 and follow-up with oncologist on 2/14.  Family verbalized understanding and have documented timing of appointments with plans to accompany patient for support.  We discussed at length patient's ongoing symptoms.  Endorses control over nausea.  Ongoing fatigue.  Patient expresses her biggest concern is her pain and discharging home with minimal relief.  I reviewed at length current pain regimen.  Patient is receiving IV morphine as needed in addition to oxycodone  5 mg.  I discussed at length ongoing pain management with the importance of decreasing IV use in preparation of discharging home.  Patient and family verbalized understanding as well as importance of allowing response to oral medications to assist medical team with correct dosing.  Education provided on the use of long-acting pain medication in addition to breakthrough for better pain control orally.  She is aware of potential side effects.  Will plan to start patient on MS Contin at bedtime.  She understands we will extend out her IV morphine with a goal of using only in the event of severe pain not controlled by oral medications.  Victoria [name] was struggling with constipation but this has since been resolved.  She is taking daily MiraLAX and senna.  Education provided on continued adherence at discharge in the setting of opioid use.  I discussed the importance of continued conversation with family and their medical providers regarding overall plan of care and treatment options, ensuring decisions are within the context of the patients values and GOCs.  Questions and concerns were addressed.  Patient and family was encouraged to call with questions or concerns.  PMT will continue to support holistically as needed.   Objective:   Primary Diagnoses: Present on Admission:  Hypertension  Pancreatic cancer ([place])  Multifocal pneumonia   Scheduled Meds:  ALPRAZolam  0.5 mg Oral BID   apixaban  5 mg Oral BID   arformoterol  15 mcg Nebulization BID   benzonatate  200 mg Oral TID   budesonide (PULMICORT) nebulizer solution  0.25 mg Nebulization BID   ferrous sulfate  325 mg Oral Daily   furosemide  20 mg Oral Daily   guaiFENesin  1,200 mg Oral BID   insulin aspart  0-15 Units Subcutaneous TID WC   insulin aspart  0-5 Units Subcutaneous QHS   ipratropium  0.5 mg Nebulization BID   levalbuterol  0.63 mg Nebulization BID   losartan  100 mg Oral Daily   morphine  15 mg Oral Q12H   pantoprazole  40  mg Oral Daily   polyethylene glycol  17 g Oral Daily   senna-docusate  1 tablet Oral BID    Continuous Infusions:   PRN Meds: acetaminophen **OR** acetaminophen, albuterol, hydrALAZINE, morphine injection, ondansetron **OR** ondansetron (ZOFRAN) IV, oxyCODONE  Allergies  Allergen Reactions   Lisinopril Cough    Review of Systems  Constitutional:  Positive for activity change, appetite change and fatigue.  Gastrointestinal:  Positive for abdominal pain.  Unless otherwise noted, a complete review of systems is negative.  Physical Exam General: NAD, chronically-ill appearing Cardiovascular: Tachycardia  Pulmonary:  diminished bilaterally  Abdomen: soft, tender, obese,+ bowel sounds Extremities: no edema, no joint deformities Skin: no rashes, warm and dry Neurological: AAO x3, mood appropriate   Vital Signs:  BP 125/74 (BP Location: Left Arm)   Pulse (!) 102   Temp 98.4 F (36.9 C) (Oral)   Resp 20   Ht 5' 8"$  (1.727 m)   Wt 85.5 kg   SpO2 94%  BMI 28.66 kg/m  Pain Scale: 0-10 POSS *See Group Information*: 1-Acceptable,Awake and alert Pain Score: 2   SpO2: SpO2: 94 % O2 Device:SpO2: 94 % O2 Flow Rate: .O2 Flow Rate (L/min): 2 L/min (down to 1 lpm post neb)  IO: Intake/output summary:  Intake/Output Summary (Last 24 hours) at 02/01/2023 1444 Last data filed at 01/31/2023 1851 Gross per 24 hour  Intake 1301 ml  Output --  Net 1301 ml    LBM: Last BM Date : 01/29/23 Baseline Weight: Weight: 83.9 kg Most recent weight: Weight: 85.5 kg      Palliative Assessment/Data:    Advanced Care Planning:   Primary Decision Maker: PATIENT  Code Status/Advance Care Planning: Full code  A discussion was had today regarding advanced directives. Concepts specific to code status, artifical feeding and hydration, continued IV antibiotics and rehospitalization was had.  The difference between a aggressive medical intervention path and a palliative comfort care path was  discussed.   Patient does not have a completed advanced directive.  We discussed the importance of completing.  She understands in the setting of no completed documentation legally her children would be her medical decisions makers with equal rights.  She expresses plans to complete in the near future after talking further with family.  Education provided on the ability to have completed during hospitalization or at the cancer center.  She verbalized understanding.  Patient wishes to remain a full code at this time.  Does express plans to further discuss her wishes with her family before making any decisions.  Palliative Care services outpatient were explained and offered. Patient and family verbalized their understanding and awareness of palliative's goals and philosophy of care.  Patient is being actively followed by Dr. Burr Medico at [facility].  I will plan to maintain close follow-up for symptom management support at the cancer center in collaboration with her oncology team.  Patient and family is aware and contact information provided.  I will plan to see patient on 2/15 when she comes in for regularly scheduled visit.  Assessment & Plan:   SUMMARY OF RECOMMENDATIONS   Full Code/Full Scope-as requested and confirmed by patient.  Continue with current plan of care.  Ms. Marcello is realistic in her understanding.  Clear and expressed wishes to continue to treat the treatable aggressively allowing her every opportunity to continue to thrive.  Expresses anxiety regarding pain control at discharge.  Assured patient this will continue to be closely monitored. Ongoing goals of care discussions including CODE STATUS and advanced directives amongst family.  Education provided on advance directive clinic that is available at the cancer center versus completing while hospitalized. PMT will continue to support and follow as needed. Please secure chat with urgent needs.  Patient has been actively  followed by Dr. Burr Medico at [facility].  I will plan to maintain close follow-up for symptom management in collaboration with her other oncology appointments.  Patient has a scheduled appointment to see myself on 2/15.  She and family are aware.  Symptom Management:  Neoplasm related pain Oxycodone 5-10 mg every 4 hours as needed for breakthrough pain (patient has received total of 30 mg over the past 24 hours.  Previously receiving hydrocodone with a total of 20 mg between the timeframe of 2/7 - 2/8. Will start patient on MS Contin 15 mg every 12 hours.  First dose scheduled for tonight. Morphine 4 mg IV frequency extended to every 6 hours as needed for severe pain  not controlled with oral medications.  Extensive discussion with patient and family regarding use of oral medications first before attempting IV medications in the setting of preparation for discharging home and gaining full understanding of pain control with potential home regimen.  Total of 7 doses received over the past 24 hours. MS Contin and Oxy IR prescriptions sent to pharmacy in preparation for discharge.  Anxiety Continue Xanax 0.5 mg twice daily Constipation MiraLAX daily Senna S twice daily  Palliative Prophylaxis:  Bowel Regimen and Frequent Pain Assessment  Additional Recommendations (Limitations, Scope, Preferences): Full Scope Treatment  Psycho-social/Spiritual:  Desire for further Chaplaincy support: no Additional Recommendations:  ongoing goals of care and symptom management support   Prognosis:  Guarded   Discharge Planning:  To Be Determined with outpatient palliative support at [facility].  Patient expressed understanding and was in agreement with this plan.   Time Total: 70 min   Visit consisted of counseling and education dealing with the complex and emotionally intense issues of symptom management and palliative care in the setting of serious and potentially  life-threatening illness.Greater than 50%  of this time was spent counseling and coordinating care related to the above assessment and plan.  Signed by:  Alda Lea, AGPCNP-BC [place]   Phone: [phone] Pager: [phone] Amion: Bjorn Pippin   Thank you for allowing the Palliative Medicine Team to assist in the care of this patient. Please utilize secure chat with additional questions, if there is no response within 30 minutes please call the above phone number. Palliative Medicine Team providers are available by phone from 7am to 5pm daily and can be reached through the team cell phone.  Should this patient require assistance outside of these hours, please call the patient's attending physician.

## 2023-02-01 NOTE — Progress Notes (Signed)
PT demonstrated hands on understanding of Flutter device. NPC at this time.

## 2023-02-02 ENCOUNTER — Inpatient Hospital Stay (HOSPITAL_COMMUNITY): Payer: Medicare HMO

## 2023-02-02 DIAGNOSIS — J189 Pneumonia, unspecified organism: Secondary | ICD-10-CM | POA: Diagnosis not present

## 2023-02-02 DIAGNOSIS — C259 Malignant neoplasm of pancreas, unspecified: Secondary | ICD-10-CM | POA: Diagnosis not present

## 2023-02-02 DIAGNOSIS — I5031 Acute diastolic (congestive) heart failure: Secondary | ICD-10-CM | POA: Diagnosis not present

## 2023-02-02 DIAGNOSIS — E119 Type 2 diabetes mellitus without complications: Secondary | ICD-10-CM | POA: Diagnosis not present

## 2023-02-02 LAB — COMPREHENSIVE METABOLIC PANEL
ALT: 59 U/L — ABNORMAL HIGH (ref 0–44)
AST: 89 U/L — ABNORMAL HIGH (ref 15–41)
Albumin: 2.7 g/dL — ABNORMAL LOW (ref 3.5–5.0)
Alkaline Phosphatase: 222 U/L — ABNORMAL HIGH (ref 38–126)
Anion gap: 10 (ref 5–15)
BUN: 26 mg/dL — ABNORMAL HIGH (ref 8–23)
CO2: 29 mmol/L (ref 22–32)
Calcium: 8.8 mg/dL — ABNORMAL LOW (ref 8.9–10.3)
Chloride: 99 mmol/L (ref 98–111)
Creatinine, Ser: 0.87 mg/dL (ref 0.44–1.00)
GFR, Estimated: >60 mL/min (ref >60)
Glucose, Bld: 166 mg/dL — ABNORMAL HIGH (ref 70–99)
Potassium: 4.1 mmol/L (ref 3.5–5.1)
Sodium: 138 mmol/L (ref 135–145)
Total Bilirubin: 1 mg/dL (ref 0.3–1.2)
Total Protein: 6.4 g/dL — ABNORMAL LOW (ref 6.5–8.1)

## 2023-02-02 LAB — GLUCOSE, CAPILLARY
Glucose-Capillary: 152 mg/dL — ABNORMAL HIGH (ref 70–99)
Glucose-Capillary: 281 mg/dL — ABNORMAL HIGH (ref 70–99)
Glucose-Capillary: 114 mg/dL — ABNORMAL HIGH (ref 70–99)
Glucose-Capillary: 173 mg/dL — ABNORMAL HIGH (ref 70–99)

## 2023-02-02 LAB — CBC WITH DIFFERENTIAL/PLATELET
Abs Immature Granulocytes: 0.03 10*3/uL (ref 0.00–0.07)
Basophils Absolute: 0 10*3/uL (ref 0.0–0.1)
Basophils Relative: 0 %
Eosinophils Absolute: 0.1 10*3/uL (ref 0.0–0.5)
Eosinophils Relative: 2 %
HCT: 27.4 % — ABNORMAL LOW (ref 36.0–46.0)
Hemoglobin: 8.6 g/dL — ABNORMAL LOW (ref 12.0–15.0)
Immature Granulocytes: 0 %
Lymphocytes Relative: 12 %
Lymphs Abs: 0.9 10*3/uL (ref 0.7–4.0)
MCH: 28.2 pg (ref 26.0–34.0)
MCHC: 31.4 g/dL (ref 30.0–36.0)
MCV: 89.8 fL (ref 80.0–100.0)
Monocytes Absolute: 0.8 10*3/uL (ref 0.1–1.0)
Monocytes Relative: 10 %
Neutro Abs: 5.8 10*3/uL (ref 1.7–7.7)
Neutrophils Relative %: 76 %
Platelets: 196 10*3/uL (ref 150–400)
RBC: 3.05 MIL/uL — ABNORMAL LOW (ref 3.87–5.11)
RDW: 17.4 % — ABNORMAL HIGH (ref 11.5–15.5)
WBC: 7.6 10*3/uL (ref 4.0–10.5)
nRBC: 0 % (ref 0.0–0.2)

## 2023-02-02 LAB — PHOSPHORUS: Phosphorus: 3.9 mg/dL (ref 2.5–4.6)

## 2023-02-02 LAB — MAGNESIUM: Magnesium: 2 mg/dL (ref 1.7–2.4)

## 2023-02-02 NOTE — Progress Notes (Signed)
PROGRESS NOTE    Victoria [name]  [id number] DOB: 07/29/1941 DOA: 01/24/2023 PCP: Elby Showers, MD   Brief Narrative:  he patient is a 82 year old overweight African-American female with a past medical history significant for but not limited to history of PE and DVT on anticoagulation, diabetes mellitus type 2, pancreatic cancer, hypertension as well as other comorbidities who presented with weakness and shortness of breath. She reports that she had been doing well since her discharge from the hospital for community-acquired pneumonia however over the last 2 days she notes that she had a productive cough and weakness. She did not have any fever, nausea, vomiting, diarrhea or sick contacts. She took some over-the-counter medications prescribed for her cough however did did not help. Her weakness worsened so she decided come to the ED for further evaluation. Her initial chest x-ray showed a patchy bibasilar airspace opacity with a multifocal pneumonia versus asymmetrical pulmonary edema noted and so she was given treatment with IV antibiotics and started on diuresis. She started improving with diuresis and her antibiotics were changed to oral medications.     She was planning to be discharged yesterday however she acutely worsen and became more dyspneic.  Pulm is consulted and recommended no changes to plan of care.  Today she continues to be dyspneic and desaturated on ambulatory home O2 screen.  Given her multiple medical comorbidities palliative has been consulted for further evaluation recommendations.    DG Chest X-Ray done and showed "No significant change in diffuse interstitial prominence and mild bibasilar airspace disease." Palliative care consulted and making adjustments for pain regimen and pain is slowly improving but still persistent.   Assessment and Plan:  Dyspnea 2/2 Multifocal pneumonia with Acute Respiratory Failure with Hypoxia and likely concomitant Acute Diastolic CHF  Exacerbation. -Patient presented with generalized weakness, shortness of breath. -Patient noted to have recently hospitalized and treated for a atypical pneumonia with 5 days of IV Rocephin and IV azithromycin. -BNP within normal limits. -Procalcitonin negative. -MRSA PCR negative. -Blood cultures pending with no growth to date x 5 Days. -SpO2: 94 % O2 Flow Rate (L/min): 2 L/min (down to 1 lpm post neb) -Respiratory viral panel negative. -SARS coronavirus PCR negative, influenza A and B PCR negative, respiratory syncytial virus negative. -Discontinued IV Vancomycin and Continue IV Cefepime and oral Abx now stopped.  -2D echo with EF 60 to 65%, NWMA, grade 1 diastolic dysfunction.   -IV Lasix has not been stopped and she has been transitioned to oral Lasix -Given her continued dyspnea we have added Xopenex and Atrovent scheduled every 6 as well as Budesonide and Brovana as well as increasing the guaifenesin to 1200 mg p.o. twice daily and adding a flutter valve today incentive spirometry; now the budesonide has increased from 0.25 mg to 0.5 mg twice daily -Omnicef now discontinued -Strict I's and O's, daily weights. -Repeat CXR today done and showed "Bilateral interstitial/mild airspace opacities, slightly increased on the RIGHT." -Continues to feel dyspenic and given her worsening chest x-ray we have consulted pulmonary for further evaluation; pulmonary evaluated and she is recently been treated for pneumonia as well as decompensated diastolic heart failure with her infectious workup being negative and blood cultures being negative pulmonary had no really further recommendations and recommended no changes to current management but we have made further changes as above -Will repeat ambulatory home O2 screen -Palliative care consulted for further goals of care discussion and symptom management given that she continues to be dyspneic and they  are recommending continuing current plan of care and  have made medication adjustments -Repeat CXR in the AM    Acute Diastolic CHF Exacerbation/Volume Overload, improved -Patient had shortness of breath, initial concern was for multifocal pneumonia. -Patient placed on IV Lasix with good urine output and clinical improvement with diuresis. -2D echo  with EF 60 to 65%, NWMA, grade 1 diastolic dysfunction. -IV Lasix has been transitioned to oral Lasix 20 mg daily now -Strict I's and O's and Daily Weights; Patient is -9 lbs since 01/24/23 and is +326 mLiters since Admission  -DG Chest X-Ray done yesterday and showed "Normal cardiac silhouette. Patchy bilateral airspace opacities appears slightly improved. No focal consolidation. No pneumothorax. No acute osseous abnormality." -CXR this AM done and showed "No significant change in diffuse interstitial prominence and mild bibasilar airspace disease." -May benefit from outpatient follow-up with Cardiology.   Hypertension -Continue home regimen of Losartan but will discontinue HCTZ. -He was transitioned off of IV Lasix to p.o. Lasix 20 mg daily now -Will purse discontinuation of HCTZ since the patient is being transitioned to oral Furosemide -Continue to Monitor BP per Protocol -C/w Hydralazine 10 mg IV q8hprn for HBP  -Last BP Reading was 129/76   History of DVT/PE -Continue Anticoagulation with Apixaban 5 mg po BID   Diabetes Mellitus Type 2 -Hemoglobin A1c 6.7 (12/06/2022) -Continue to hold oral hypoglycemic agents.   -Continue Moderate Novolog SSI AC/HS. -CBG's ranging from 114-281   Abnormal LFTs in setting of history of hepatitis C liver cirrhosis -LFT Trend: Recent Labs  Lab 01/24/23 1045 01/25/23 0559 01/29/23 0919 01/30/23 0505 01/31/23 0957 02/01/23 0602 02/02/23 0527  AST 31 26 47* 81* 68* 83* 89*  ALT 23 20 33 47* 47* 57* 59*  BILITOT 0.9 0.6 1.0 1.1 1.2 1.1 1.0  ALKPHOS 130* 105 145* 188* 186* 207* 222*  -Check right upper quadrant ultrasound and showed "Status post  cholecystectomy. Findings consistent with hepatic cirrhosis. No definite focal sonographic hepatic abnormality seen." -She follows up with [facility] and has a known history of cirrhosis   Normocytic Anemia -Anemia panel consistent with anemia of chronic disease and showed an iron level of 87, UIBC of 308, TIBC 395, saturation ratios of 22% -Patient was complains of some generalized weakness, denies any overt bleeding. -C/w Ferrous Sulfate tab 325 mg po Daily  -Due to current presentation with shortness of breath, concern for volume overload she was transfused 1 unit of PRBC -Hgb/Hct Trend: Recent Labs  Lab 01/28/23 0517 01/28/23 2030 01/29/23 0919 01/30/23 0505 01/31/23 0957 02/01/23 0602 02/02/23 0527  HGB 7.5* 8.8* 9.3* 9.9* 9.3* 8.9* 8.6*  HCT 24.1* 27.9* 29.2* 31.0* 30.1* 28.7* 27.4*  MCV 87.6  --  88.8 88.3 90.4 89.4 89.8  -Follow H&H. -Transfusion threshold hemoglobin < 8. -Continue to monitor for signs and symptoms of bleeding; no overt bleeding noted -Repeat CBC in a.m.   History of Pancreatic Cancer -Outpatient follow-up with Oncology. -Oncology Dr. Burr Medico notified of admission via epic and I discussed the case with Dr. Burr Medico and patient was supposed to have a Port-A-Cath placed however Dr. Burr Medico take care of this in the outpatient setting early next week. -Palliative Care consulted for further [place] discussion and remains full code with full scope of care with currently continuing current plan of care.  Palliative care to follow-up and support and will see the patient on 02/06/2022.  In terms of her symptom management they are starting MS Contin 15 mg every 12 hours and recommending  continuing Oxy IR 5-10 mg every 4 hours as needed for breakthrough pain; patient also has IV morphine 4 mg every 6 as needed severe pain -Pain was still uncontrolled and she has just gotten a few doses of MS IR but this is improving -Oncology has arranged for an outpatient Port-A-Cath  which was to be done in the morning however will send a message to the interventional radiology team given that she is still in the hospital and continues to have uncontrolled pain that prohibits her from being discharged.   Hypokalemia, improved  -Patient's K+ Level Trend: Recent Labs  Lab 01/27/23 0917 01/28/23 0517 01/29/23 0919 01/30/23 0505 01/31/23 0957 02/01/23 0602 02/02/23 0527  K 3.2* 3.6 3.7 4.2 3.9 3.9 4.1  -Mag Level was 2.0 -Continue to Monitor and Replete as Necessary -Repeat CMP in the AM    Hypoalbuminemia -Patient's Albumin Trend: Recent Labs  Lab 01/24/23 1045 01/25/23 0559 01/29/23 0919 01/30/23 0505 01/31/23 0957 02/01/23 0602 02/02/23 0527  ALBUMIN 3.0* 2.6* 2.9* 3.0* 2.8* 2.8* 2.7*  -Continue to Monitor and Trend and repeat CMP in the AM    Overweight  -Complicates overall prognosis and care -Estimated body mass index is 28.66 kg/m as calculated from the following:   Height as of this encounter: 5' 8"$  (1.727 m).   Weight as of this encounter: 85.5 kg.  -Weight Loss and Dietary Counseling given  GERD/GI Prophylaxis -C/w Pantoprazole 40 mg po Daily   DVT prophylaxis:  apixaban (ELIQUIS) tablet 5 mg    Code Status: Full Code Family Communication: No family present at bedside   Disposition Plan:  Level of care: Telemetry Status is: Inpatient Remains inpatient appropriate because: She continues to have some uncontrolled pain but is improving.  Patient is supposed to undergo Port-A-Cath placement tomorrow so we will message the interventional radiologist to see if she can get it done inpatient   Consultants:  Pulmonary Palliative care medicine  Procedures:  As delineated as above  Antimicrobials:  Anti-infectives (From admission, onward)    Start     Dose/Rate Route Frequency Ordered Stop   01/28/23 1000  cefdinir (OMNICEF) capsule 300 mg        300 mg Oral Every 12 hours 01/28/23 0848 01/29/23 2145   01/26/23 2200  ceFEPIme  (MAXIPIME) 2 g in sodium chloride 0.9 % 100 mL IVPB  Status:  Discontinued        2 g 200 mL/hr over 30 Minutes Intravenous Every 12 hours 01/26/23 1012 01/28/23 0848   01/25/23 1500  ceFEPIme (MAXIPIME) 2 g in sodium chloride 0.9 % 100 mL IVPB  Status:  Discontinued        2 g 200 mL/hr over 30 Minutes Intravenous Every 8 hours 01/25/23 1215 01/26/23 1012   01/25/23 1200  vancomycin (VANCOREADY) IVPB 1500 mg/300 mL  Status:  Discontinued        1,500 mg 150 mL/hr over 120 Minutes Intravenous Every 24 hours 01/24/23 1425 01/26/23 0838   01/24/23 2300  ceFEPIme (MAXIPIME) 2 g in sodium chloride 0.9 % 100 mL IVPB  Status:  Discontinued        2 g 200 mL/hr over 30 Minutes Intravenous Every 12 hours 01/24/23 1422 01/25/23 1215   01/24/23 1115  vancomycin (VANCOREADY) IVPB 1500 mg/300 mL        1,500 mg 150 mL/hr over 120 Minutes Intravenous  Once 01/24/23 1103 01/24/23 1405   01/24/23 1100  ceFEPIme (MAXIPIME) 2 g in sodium chloride 0.9 % 100 mL  IVPB        2 g 200 mL/hr over 30 Minutes Intravenous  Once 01/24/23 1050 01/24/23 1132       Subjective: Seen and examined at bedside and she was still complaining some pain and felt a little bit better.  No nausea or vomiting thinks her breathing is getting a little bit better today.  Has not ambulated yet.  No other concerns or complaints at this time.  Objective: Vitals:   02/01/23 2200 02/02/23 0523 02/02/23 1043 02/02/23 1156  BP:  118/75  129/76  Pulse:  (!) 104  (!) 101  Resp:  16  17  Temp:  98.8 F (37.1 C)    TempSrc:  Oral    SpO2: 97% (!) 89% 91% 94%  Weight:      Height:        Intake/Output Summary (Last 24 hours) at 02/02/2023 1649 Last data filed at 02/02/2023 1500 Gross per 24 hour  Intake 958 ml  Output --  Net 958 ml   Filed Weights   01/26/23 0500 01/27/23 0500 01/30/23 0500  Weight: 87.6 kg 84.8 kg 85.5 kg   Examination: Physical Exam:  Constitutional: WN/WD overweight African-American female who is  appearing little bit more comfortable today compared to yesterday Respiratory: Diminished to auscultation bilaterally with coarse breath sounds, no wheezing, rales, rhonchi or crackles. Normal respiratory effort and patient is not tachypenic. No accessory muscle use.  Unlabored breathing and is now off of the supplemental oxygen Cardiovascular: RRR, no murmurs / rubs / gallops. S1 and S2 auscultated.   Abdomen: Soft, non-tender, non-distended.  Bowel sounds positive.  GU: Deferred. Musculoskeletal: No clubbing / cyanosis of digits/nails. No joint deformity upper and lower extremities.  Skin: No rashes, lesions, ulcers. No induration; Warm and dry.  Neurologic: CN 2-12 grossly intact with no focal deficits. Romberg sign and cerebellar reflexes not assessed.  Psychiatric: Normal judgment and insight. Alert and oriented x 3.  She appears a little depressed appearing  Data Reviewed: I have personally reviewed following labs and imaging studies  CBC: Recent Labs  Lab 01/29/23 0919 01/30/23 0505 01/31/23 0957 02/01/23 0602 02/02/23 0527  WBC 8.8 9.5 8.8 8.2 7.6  NEUTROABS 6.4 7.0 6.8 6.2 5.8  HGB 9.3* 9.9* 9.3* 8.9* 8.6*  HCT 29.2* 31.0* 30.1* 28.7* 27.4*  MCV 88.8 88.3 90.4 89.4 89.8  PLT 239 263 204 214 [id number]   Basic Metabolic Panel: Recent Labs  Lab 01/29/23 0919 01/30/23 0505 01/31/23 0957 02/01/23 0602 02/02/23 0527  NA 136 135 135 135 138  K 3.7 4.2 3.9 3.9 4.1  CL 98 95* 96* 95* 99  CO2 26 28 29 29 29  $ GLUCOSE 149* 195* 245* 194* 166*  BUN 22 25* 30* 30* 26*  CREATININE 0.98 1.09* 1.06* 1.02* 0.87  CALCIUM 8.8* 9.0 8.9 8.7* 8.8*  MG 1.9 2.0 2.0 2.0 2.0  PHOS 3.9 4.0 4.0 4.6 3.9   GFR: Estimated Creatinine Clearance: 60 mL/min (by C-G formula based on SCr of 0.87 mg/dL). Liver Function Tests: Recent Labs  Lab 01/29/23 0919 01/30/23 0505 01/31/23 0957 02/01/23 0602 02/02/23 0527  AST 47* 81* 68* 83* 89*  ALT 33 47* 47* 57* 59*  ALKPHOS 145* 188* 186* 207* 222*   BILITOT 1.0 1.1 1.2 1.1 1.0  PROT 6.8 6.8 6.7 7.0 6.4*  ALBUMIN 2.9* 3.0* 2.8* 2.8* 2.7*   No results for input(s): "LIPASE", "AMYLASE" in the last 168 hours. No results for input(s): "AMMONIA" in the last 168 hours.  Coagulation Profile: No results for input(s): "INR", "PROTIME" in the last 168 hours. Cardiac Enzymes: No results for input(s): "CKTOTAL", "CKMB", "CKMBINDEX", "TROPONINI" in the last 168 hours. BNP (last 3 results) No results for input(s): "PROBNP" in the last 8760 hours. HbA1C: No results for input(s): "HGBA1C" in the last 72 hours. CBG: Recent Labs  Lab 02/01/23 1643 02/01/23 2128 02/02/23 0729 02/02/23 1133 02/02/23 1638  GLUCAP 147* 157* 152* 281* 114*   Lipid Profile: No results for input(s): "CHOL", "HDL", "LDLCALC", "TRIG", "CHOLHDL", "LDLDIRECT" in the last 72 hours. Thyroid Function Tests: No results for input(s): "TSH", "T4TOTAL", "FREET4", "T3FREE", "THYROIDAB" in the last 72 hours. Anemia Panel: No results for input(s): "VITAMINB12", "FOLATE", "FERRITIN", "TIBC", "IRON", "RETICCTPCT" in the last 72 hours. Sepsis Labs: No results for input(s): "PROCALCITON", "LATICACIDVEN" in the last 168 hours.  Recent Results (from the past 240 hour(s))  Resp panel by RT-PCR (RSV, Flu A&B, Covid) Anterior Nasal Swab     Status: None   Collection Time: 01/24/23 10:15 AM   Specimen: Anterior Nasal Swab  Result Value Ref Range Status   SARS Coronavirus 2 by RT PCR NEGATIVE NEGATIVE Final    Comment: (NOTE) SARS-CoV-2 target nucleic acids are NOT DETECTED.  The SARS-CoV-2 RNA is generally detectable in upper respiratory specimens during the acute phase of infection. The lowest concentration of SARS-CoV-2 viral copies this assay can detect is 138 copies/mL. A negative result does not preclude SARS-Cov-2 infection and should not be used as the sole basis for treatment or other patient management decisions. A negative result may occur with  improper specimen  collection/handling, submission of specimen other than nasopharyngeal swab, presence of viral mutation(s) within the areas targeted by this assay, and inadequate number of viral copies(<138 copies/mL). A negative result must be combined with clinical observations, patient history, and epidemiological information. The expected result is Negative.  Fact Sheet for Patients:  EntrepreneurPulse.com.au  Fact Sheet for Healthcare Providers:  IncredibleEmployment.be  This test is no t yet approved or cleared by the [place] FDA and  has been authorized for detection and/or diagnosis of SARS-CoV-2 by FDA under an Emergency Use Authorization (EUA). This EUA will remain  in effect (meaning this test can be used) for the duration of the COVID-19 declaration under Section 564(b)(1) of the Act, 21 U.S.C.section 360bbb-3(b)(1), unless the authorization is terminated  or revoked sooner.       Influenza A by PCR NEGATIVE NEGATIVE Final   Influenza B by PCR NEGATIVE NEGATIVE Final    Comment: (NOTE) The Xpert Xpress SARS-CoV-2/FLU/RSV plus assay is intended as an aid in the diagnosis of influenza from Nasopharyngeal swab specimens and should not be used as a sole basis for treatment. Nasal washings and aspirates are unacceptable for Xpert Xpress SARS-CoV-2/FLU/RSV testing.  Fact Sheet for Patients: EntrepreneurPulse.com.au  Fact Sheet for Healthcare Providers: IncredibleEmployment.be  This test is not yet approved or cleared by the [place] FDA and has been authorized for detection and/or diagnosis of SARS-CoV-2 by FDA under an Emergency Use Authorization (EUA). This EUA will remain in effect (meaning this test can be used) for the duration of the COVID-19 declaration under Section 564(b)(1) of the Act, 21 U.S.C. section 360bbb-3(b)(1), unless the authorization is terminated or revoked.     Resp Syncytial  Virus by PCR NEGATIVE NEGATIVE Final    Comment: (NOTE) Fact Sheet for Patients: EntrepreneurPulse.com.au  Fact Sheet for Healthcare Providers: IncredibleEmployment.be  This test is not yet approved or cleared by the [place] FDA  and has been authorized for detection and/or diagnosis of SARS-CoV-2 by FDA under an Emergency Use Authorization (EUA). This EUA will remain in effect (meaning this test can be used) for the duration of the COVID-19 declaration under Section 564(b)(1) of the Act, 21 U.S.C. section 360bbb-3(b)(1), unless the authorization is terminated or revoked.  Performed at [facility], [place] [address] [place], [place] [address]   Blood culture (routine x 2)     Status: None   Collection Time: 01/24/23 11:17 AM   Specimen: BLOOD LEFT ARM  Result Value Ref Range Status   Specimen Description   Final    BLOOD LEFT ARM BOTTLES DRAWN AEROBIC AND ANAEROBIC Performed at [place] [address] [place], [place] [address]    Special Requests   Final    Blood Culture adequate volume Performed at [facility] [address], [place], [place] [address]    Culture   Final    NO GROWTH 5 DAYS Performed at [facility], [place] [address], [place], [place] [address]    Report Status 01/29/2023 FINAL  Final  Blood culture (routine x 2)     Status: None   Collection Time: 01/24/23 11:25 AM   Specimen: BLOOD LEFT HAND  Result Value Ref Range Status   Specimen Description BLOOD LEFT HAND  Final   Special Requests   Final    BOTTLES DRAWN AEROBIC AND ANAEROBIC Blood Culture adequate volume   Culture   Final    NO GROWTH 5 DAYS Performed at [facility], [place] [address], [place], [place] [address]    Report Status 01/29/2023 FINAL  Final  Respiratory (~20 pathogens) panel by PCR     Status: None   Collection Time: 01/24/23 12:33 PM   Specimen: Nasopharyngeal Swab;  Respiratory  Result Value Ref Range Status   Adenovirus NOT DETECTED NOT DETECTED Final   Coronavirus 229E NOT DETECTED NOT DETECTED Final    Comment: (NOTE) The Coronavirus on the Respiratory Panel, DOES NOT test for the novel  Coronavirus (2019 nCoV)    Coronavirus HKU1 NOT DETECTED NOT DETECTED Final   Coronavirus NL63 NOT DETECTED NOT DETECTED Final   Coronavirus OC43 NOT DETECTED NOT DETECTED Final   Metapneumovirus NOT DETECTED NOT DETECTED Final   Rhinovirus / Enterovirus NOT DETECTED NOT DETECTED Final   Influenza A NOT DETECTED NOT DETECTED Final   Influenza B NOT DETECTED NOT DETECTED Final   Parainfluenza Virus 1 NOT DETECTED NOT DETECTED Final   Parainfluenza Virus 2 NOT DETECTED NOT DETECTED Final   Parainfluenza Virus 3 NOT DETECTED NOT DETECTED Final   Parainfluenza Virus 4 NOT DETECTED NOT DETECTED Final   Respiratory Syncytial Virus NOT DETECTED NOT DETECTED Final   Bordetella pertussis NOT DETECTED NOT DETECTED Final   Bordetella Parapertussis NOT DETECTED NOT DETECTED Final   Chlamydophila pneumoniae NOT DETECTED NOT DETECTED Final   Mycoplasma pneumoniae NOT DETECTED NOT DETECTED Final    Comment: Performed at [facility], [place]. [address] [place],  [address]  MRSA Next Gen by PCR, Nasal     Status: None   Collection Time: 01/25/23  1:15 PM   Specimen: Nasal Mucosa; Nasal Swab  Result Value Ref Range Status   MRSA by PCR Next Gen NOT DETECTED NOT DETECTED Final    Comment: (NOTE) The GeneXpert MRSA Assay (FDA approved for NASAL specimens only), is one component of a comprehensive MRSA colonization surveillance program. It is  not intended to diagnose MRSA infection nor to guide or monitor treatment for MRSA infections. Test performance is not FDA approved in patients less than 40 years old. Performed at [facility], [place] [address], [place], [place] [address]     Radiology Studies: DG CHEST PORT 1 VIEW  Result Date:  02/02/2023 CLINICAL DATA:  Shortness of breath EXAM: PORTABLE CHEST 1 VIEW COMPARISON:  02/01/2023 and prior studies FINDINGS: The cardiomediastinal silhouette is unchanged. Bilateral interstitial/mild airspace opacities again noted, slightly increased on the RIGHT. There is no evidence of pneumothorax or large pleural effusion. No other changes are noted. IMPRESSION: Bilateral interstitial/mild airspace opacities, slightly increased on the RIGHT. Electronically Signed   By: Margarette Canada M.D.   On: 02/02/2023 08:25   DG CHEST PORT 1 VIEW  Result Date: 02/01/2023 CLINICAL DATA:  Shortness of breath.  Pneumonia. EXAM: PORTABLE CHEST 1 VIEW COMPARISON:  01/31/2023 FINDINGS: Heart size is stable. Diffuse interstitial prominence noted. Mild airspace disease is seen in both lower lungs, without significant change. No evidence of pleural effusion. IMPRESSION: No significant change in diffuse interstitial prominence and mild bibasilar airspace disease. Electronically Signed   By: Marlaine Hind M.D.   On: 02/01/2023 09:03    Scheduled Meds:  ALPRAZolam  0.5 mg Oral BID   apixaban  5 mg Oral BID   arformoterol  15 mcg Nebulization BID   benzonatate  200 mg Oral TID   budesonide (PULMICORT) nebulizer solution  0.5 mg Nebulization BID   ferrous sulfate  325 mg Oral Daily   furosemide  20 mg Oral Daily   guaiFENesin  1,200 mg Oral BID   insulin aspart  0-15 Units Subcutaneous TID WC   insulin aspart  0-5 Units Subcutaneous QHS   ipratropium  0.5 mg Nebulization BID   levalbuterol  0.63 mg Nebulization BID   losartan  100 mg Oral Daily   morphine  15 mg Oral Q12H   pantoprazole  40 mg Oral Daily   polyethylene glycol  17 g Oral Daily   senna-docusate  1 tablet Oral BID   Continuous Infusions:   LOS: 8 days   Raiford Noble, DO [facility] Available via Epic secure chat 7am-7pm After these hours, please refer to coverage provider listed on [email or web address] 02/02/2023, 4:49 PM

## 2023-02-02 NOTE — Care Plan (Signed)
SATURATION QUALIFICATIONS: (This note is used to comply with regulatory documentation for home oxygen)  Patient Saturations on Room Air at Rest = 96%  Patient Saturations on Room Air while Ambulating = 93%  Patient Saturations on 2 Liters of oxygen while Ambulating = 99%  Please briefly explain why patient needs home oxygen:      Heart Rate while ambulating sustaining in the 120s.

## 2023-02-02 NOTE — Progress Notes (Signed)
Daily Progress Note   Patient Name: Victoria [name]       Date: 02/02/2023 DOB: 1941/03/01  Age: 82 y.o. MRN#: [id number] Attending Physician: Kerney Elbe, DO Primary Care Physician: Elby Showers, MD Admit Date: 01/24/2023  Reason for Consultation/Follow-up: Pain control  Subjective: Awake alert Rested well Pain well controlled Anticipates tunneled catheter placement in am.   Length of Stay: 8  Current Medications: Scheduled Meds:   ALPRAZolam  0.5 mg Oral BID   apixaban  5 mg Oral BID   arformoterol  15 mcg Nebulization BID   benzonatate  200 mg Oral TID   budesonide (PULMICORT) nebulizer solution  0.5 mg Nebulization BID   ferrous sulfate  325 mg Oral Daily   furosemide  20 mg Oral Daily   guaiFENesin  1,200 mg Oral BID   insulin aspart  0-15 Units Subcutaneous TID WC   insulin aspart  0-5 Units Subcutaneous QHS   ipratropium  0.5 mg Nebulization BID   levalbuterol  0.63 mg Nebulization BID   losartan  100 mg Oral Daily   morphine  15 mg Oral Q12H   pantoprazole  40 mg Oral Daily   polyethylene glycol  17 g Oral Daily   senna-docusate  1 tablet Oral BID    Continuous Infusions:   PRN Meds: acetaminophen **OR** acetaminophen, albuterol, hydrALAZINE, morphine injection, ondansetron **OR** ondansetron (ZOFRAN) IV, oxyCODONE  Physical Exam         Awake alert Regular work of breathing No distress Abdomen not distended No edema  Vital Signs: BP 129/76 (BP Location: Left Arm)   Pulse (!) 101   Temp 98.8 F (37.1 C) (Oral)   Resp 17   Ht 5' 8"$  (1.727 m)   Wt 85.5 kg   SpO2 94%   BMI 28.66 kg/m  SpO2: SpO2: 94 % O2 Device: O2 Device: Room Air O2 Flow Rate: O2 Flow Rate (L/min): 2 L/min (down to 1 lpm post neb)  Intake/output summary:   Intake/Output Summary (Last 24 hours) at 02/02/2023 1217 Last data filed at 02/02/2023 0523 Gross per 24 hour  Intake 360 ml  Output --  Net 360 ml   LBM: Last BM Date : 01/29/23 Baseline Weight: Weight: 83.9 kg Most recent weight: Weight: 85.5 kg       Palliative Assessment/Data:  Patient Active Problem List   Diagnosis Date Noted   Palliative care patient 02/01/2023   Neoplasm related pain 02/01/2023   Shortness of breath 0000000   Acute diastolic CHF (congestive heart failure) ([place]) 01/28/2023   History of DVT (deep vein thrombosis) 01/24/2023   History of pulmonary embolism 01/24/2023   Normocytic anemia 01/24/2023   Hemoptysis 01/20/2023   Epistaxis 01/20/2023   Acute blood loss anemia 01/20/2023   Hypoxia 01/14/2023   Multifocal pneumonia 01/14/2023   Pancreatic cancer ([place]) 01/14/2023   Acute pulmonary embolism ([place]) 11/21/2022   Pancreatic lesion 11/21/2022   Pelvic relaxation 02/28/2021   S/P vaginal hysterectomy 02/28/2021   History of pancreatitis 05/07/2013   Status post cholecystectomy 05/07/2013   Hepatitis C, chronic ([place]) 01/19/2013   Thrombocytopenia ([place]) 12/14/2012   Cirrhosis ([place]) 12/14/2012   Allergic rhinitis 10/05/2012   DM2 (diabetes mellitus, type 2) ([place]) 10/07/2011   Hypertension 10/07/2011   History of breast cancer 10/07/2011   Anxiety 10/07/2011    Palliative Care Assessment & Plan   Patient Profile:    Assessment:  Palliative Care consult requested for goals of care discussion in this 82 y.o. female  with medical history significant for hepatitis C, right breast cancer s/p lumpectomy, type 2 diabetes, hypertension, thrombocytopenia, PE/DVT, and recent diagnosis of pancreatic cancer (12/2022) followed by Dr. Burr Medico with plans to initiate single agent treatment.  She was admitted on from home 01/24/2023 with shortness of breath and weakness.  Chest x-ray showed multifocal pneumonia versus pulmonary edema.  Patient was initially  started on IV antibiotics and diuretics.  PMT consulted for pain and non pain symptom management and supportive care.   Recommendations/Plan:  Medication history noted, discussed with patient, continue current pain and non pain symptom management.  Full code full scope.   Goals of Care and Additional Recommendations: Limitations on Scope of Treatment: Full Scope Treatment  Code Status:    Code Status Orders  (From admission, onward)           Start     Ordered   01/24/23 1345  Full code  Continuous       Question:  By:  Answer:  Consent: discussion documented in EHR   01/24/23 1347           Code Status History     Date Active Date Inactive Code Status Order ID Comments User Context   01/14/2023 0946 01/20/2023 1759 Full Code [id number]  Hosie Poisson, MD ED   11/21/2022 2225 11/26/2022 1548 Full Code [id number]  Bonnielee Haff, MD ED   02/28/2021 1142 03/01/2021 1549 Full Code [id number]  Cheri Fowler, MD Inpatient   02/28/2021 1142 02/28/2021 1142 Full Code [id number]  Cheri Fowler, MD Inpatient   12/14/2012 1927 12/18/2012 1856 Full Code [id number]  Sharen Hint, RN Inpatient      Advance Directive Documentation    Flowsheet Row Most Recent Value  Type of Advance Directive Living will  Pre-existing out of facility DNR order (yellow form or pink MOST form) --  "MOST" Form in Place? --       Prognosis:  Unable to determine  Discharge Planning: To Be Determined  Care plan was discussed with  patient.   Thank you for allowing the Palliative Medicine Team to assist in the care of this patient.  Mod MDM.     Greater than 50%  of this time was spent counseling and coordinating care related to the above assessment and plan.  Loistine Chance, MD  Please  contact Palliative Medicine Team phone at [phone] for questions and concerns.

## 2023-02-03 ENCOUNTER — Ambulatory Visit (HOSPITAL_COMMUNITY): Payer: Medicare HMO

## 2023-02-03 ENCOUNTER — Inpatient Hospital Stay (HOSPITAL_COMMUNITY)
Admission: RE | Admit: 2023-02-03 | Discharge: 2023-02-03 | Disposition: A | Discharge status: Home / Self Care | Payer: Medicare HMO | Source: Ambulatory Visit | Attending: Hematology | Admitting: Hematology

## 2023-02-03 ENCOUNTER — Inpatient Hospital Stay (HOSPITAL_COMMUNITY): Payer: Medicare HMO

## 2023-02-03 DIAGNOSIS — I5031 Acute diastolic (congestive) heart failure: Secondary | ICD-10-CM | POA: Diagnosis not present

## 2023-02-03 DIAGNOSIS — E119 Type 2 diabetes mellitus without complications: Secondary | ICD-10-CM | POA: Diagnosis not present

## 2023-02-03 DIAGNOSIS — C259 Malignant neoplasm of pancreas, unspecified: Secondary | ICD-10-CM | POA: Diagnosis not present

## 2023-02-03 DIAGNOSIS — J189 Pneumonia, unspecified organism: Secondary | ICD-10-CM | POA: Diagnosis not present

## 2023-02-03 LAB — COMPREHENSIVE METABOLIC PANEL
ALT: 66 U/L — ABNORMAL HIGH (ref 0–44)
AST: 95 U/L — ABNORMAL HIGH (ref 15–41)
Albumin: 3 g/dL — ABNORMAL LOW (ref 3.5–5.0)
Alkaline Phosphatase: 245 U/L — ABNORMAL HIGH (ref 38–126)
Anion gap: 10 (ref 5–15)
BUN: 20 mg/dL (ref 8–23)
CO2: 29 mmol/L (ref 22–32)
Calcium: 8.9 mg/dL (ref 8.9–10.3)
Chloride: 98 mmol/L (ref 98–111)
Creatinine, Ser: 0.93 mg/dL (ref 0.44–1.00)
GFR, Estimated: >60 mL/min (ref >60)
Glucose, Bld: 155 mg/dL — ABNORMAL HIGH (ref 70–99)
Potassium: 4.5 mmol/L (ref 3.5–5.1)
Sodium: 137 mmol/L (ref 135–145)
Total Bilirubin: 1.3 mg/dL — ABNORMAL HIGH (ref 0.3–1.2)
Total Protein: 7.7 g/dL (ref 6.5–8.1)

## 2023-02-03 LAB — CBC WITH DIFFERENTIAL/PLATELET
Abs Immature Granulocytes: 0.05 10*3/uL (ref 0.00–0.07)
Basophils Absolute: 0.1 10*3/uL (ref 0.0–0.1)
Basophils Relative: 1 %
Eosinophils Absolute: 0.2 10*3/uL (ref 0.0–0.5)
Eosinophils Relative: 2 %
HCT: 30.2 % — ABNORMAL LOW (ref 36.0–46.0)
Hemoglobin: 9.4 g/dL — ABNORMAL LOW (ref 12.0–15.0)
Immature Granulocytes: 1 %
Lymphocytes Relative: 24 %
Lymphs Abs: 2.3 10*3/uL (ref 0.7–4.0)
MCH: 28.2 pg (ref 26.0–34.0)
MCHC: 31.1 g/dL (ref 30.0–36.0)
MCV: 90.7 fL (ref 80.0–100.0)
Monocytes Absolute: 1 10*3/uL (ref 0.1–1.0)
Monocytes Relative: 10 %
Neutro Abs: 6 10*3/uL (ref 1.7–7.7)
Neutrophils Relative %: 62 %
Platelets: 231 10*3/uL (ref 150–400)
RBC: 3.33 MIL/uL — ABNORMAL LOW (ref 3.87–5.11)
RDW: 17.5 % — ABNORMAL HIGH (ref 11.5–15.5)
WBC: 9.6 10*3/uL (ref 4.0–10.5)
nRBC: 0 % (ref 0.0–0.2)

## 2023-02-03 LAB — GLUCOSE, CAPILLARY
Glucose-Capillary: 160 mg/dL — ABNORMAL HIGH (ref 70–99)
Glucose-Capillary: 181 mg/dL — ABNORMAL HIGH (ref 70–99)
Glucose-Capillary: 128 mg/dL — ABNORMAL HIGH (ref 70–99)
Glucose-Capillary: 203 mg/dL — ABNORMAL HIGH (ref 70–99)

## 2023-02-03 LAB — MAGNESIUM: Magnesium: 2.3 mg/dL (ref 1.7–2.4)

## 2023-02-03 LAB — PHOSPHORUS: Phosphorus: 4.1 mg/dL (ref 2.5–4.6)

## 2023-02-03 NOTE — Progress Notes (Signed)
PROGRESS NOTE    Victoria [name]  [id number] DOB: 21-May-1941 DOA: 01/24/2023 PCP: Elby Showers, MD   Brief Narrative:  The patient is a 82 year old overweight African-American female with a past medical history significant for but not limited to history of PE and DVT on anticoagulation, diabetes mellitus type 2, pancreatic cancer, hypertension as well as other comorbidities who presented with weakness and shortness of breath. She reports that she had been doing well since her discharge from the hospital for community-acquired pneumonia however over the last 2 days she notes that she had a productive cough and weakness. She did not have any fever, nausea, vomiting, diarrhea or sick contacts. She took some over-the-counter medications prescribed for her cough however did did not help. Her weakness worsened so she decided come to the ED for further evaluation. Her initial chest x-ray showed a patchy bibasilar airspace opacity with a multifocal pneumonia versus asymmetrical pulmonary edema noted and so she was given treatment with IV antibiotics and started on diuresis. She started improving with diuresis and her antibiotics were changed to oral medications.     She was planning to be discharged yesterday however she acutely worsen and became more dyspneic.  Pulm is consulted and recommended no changes to plan of care.  Today she continues to be dyspneic and desaturated on ambulatory home O2 screen.  Given her multiple medical comorbidities palliative has been consulted for further evaluation recommendations.    DG Chest X-Ray done and showed "No significant change in diffuse interstitial prominence and mild bibasilar airspace disease." Palliative care consulted and making adjustments for pain regimen and pain is slowly improving but still persistent and was very uncontrolled overnight.  Plan was for a Port-A-Cath placement and I have notified the IR team and unfortunately they cannot do it today so  likely could be done tomorrow.  Assessment and Plan:  Dyspnea 2/2 Multifocal pneumonia with Acute Respiratory Failure with Hypoxia and likely concomitant Acute Diastolic CHF Exacerbation, improved  -Patient presented with generalized weakness, shortness of breath. -Patient noted to have recently hospitalized and treated for a atypical pneumonia with 5 days of IV Rocephin and IV azithromycin. -BNP within normal limits. -Procalcitonin negative. -MRSA PCR negative. -Blood cultures pending with no growth to date x 5 Days. -SpO2: 93 % O2 Flow Rate (L/min): 2 L/min (down to 1 lpm post neb) -Respiratory viral panel negative. -SARS coronavirus PCR negative, influenza A and B PCR negative, respiratory syncytial virus negative. -Discontinued IV Vancomycin and Continue IV Cefepime and oral Abx now stopped.  -2D echo with EF 60 to 65%, NWMA, grade 1 diastolic dysfunction.   -IV Lasix has not been stopped and she has been transitioned to oral Lasix -Given her continued dyspnea we have added Xopenex and Atrovent scheduled every 6 as well as Budesonide and Brovana as well as increasing the guaifenesin to 1200 mg p.o. twice daily and adding a flutter valve today incentive spirometry; now the budesonide has increased from 0.25 mg to 0.5 mg twice daily -Omnicef now discontinued -Strict I's and O's, daily weights. -Repeat CXR today done and showed "Stable bilateral interstitial and airspace opacities. No acute findings." -Continues to feel dyspenic and given her worsening chest x-ray we have consulted pulmonary for further evaluation; pulmonary evaluated and she is recently been treated for pneumonia as well as decompensated diastolic heart failure with her infectious workup being negative and blood cultures being negative pulmonary had no really further recommendations and recommended no changes to current management but we  have made further changes as above -Ambulatory Home O2 screen done and she did not  desaturate yesterday -Palliative care consulted for further goals of care discussion and symptom management given that she continues to be dyspneic and they are recommending continuing current plan of care and have made medication adjustments -Repeat CXR in the AM    Acute Diastolic CHF Exacerbation/Volume Overload, improved -Patient had shortness of breath, initial concern was for multifocal pneumonia. -Patient placed on IV Lasix with good urine output and clinical improvement with diuresis. -2D echo  with EF 60 to 65%, NWMA, grade 1 diastolic dysfunction. -IV Lasix has been transitioned to oral Lasix 20 mg daily now -Strict I's and O's and Daily Weights; Patient is -9 lbs since 01/24/23 and is +326.3 mLiters since Admission and I's an O's not done today  -CXR this AM done and showed "Stable bilateral interstitial and airspace opacities. No acute findings." -May benefit from outpatient follow-up with Cardiology.   Hypertension -Continue home regimen of Losartan but will discontinue HCTZ. -He was transitioned off of IV Lasix to p.o. Lasix 20 mg daily now -Will purse discontinuation of HCTZ since the patient is being transitioned to oral Furosemide -Continue to Monitor BP per Protocol -C/w Hydralazine 10 mg IV q8hprn for HBP  -Last BP Reading was on the softer side at 109/76   History of DVT/PE -Continue Anticoagulation with Apixaban 5 mg po BID   Diabetes Mellitus Type 2 -Hemoglobin A1c 6.7 (12/06/2022) -Continue to hold oral hypoglycemic agents.   -Continue Moderate Novolog SSI AC/HS. -CBG's ranging from 114-181   Abnormal LFTs in setting of history of hepatitis C liver cirrhosis -LFT Trend: Recent Labs  Lab 01/25/23 0559 01/29/23 0919 01/30/23 0505 01/31/23 0957 02/01/23 0602 02/02/23 0527 02/03/23 0512  AST 26 47* 81* 68* 83* 89* 95*  ALT 20 33 47* 47* 57* 59* 66*  BILITOT 0.6 1.0 1.1 1.2 1.1 1.0 1.3*  ALKPHOS 105 145* 188* 186* 207* 222* 245*  -Check right upper  quadrant ultrasound and showed "Status post cholecystectomy. Findings consistent with hepatic cirrhosis. No definite focal sonographic hepatic abnormality seen." -She follows up with [facility] and has a known history of cirrhosis   Normocytic Anemia -Anemia panel consistent with anemia of chronic disease and showed an iron level of 87, UIBC of 308, TIBC 395, saturation ratios of 22% -Patient was complains of some generalized weakness, denies any overt bleeding. -C/w Ferrous Sulfate tab 325 mg po Daily  -Due to current presentation with shortness of breath, concern for volume overload she was transfused 1 unit of PRBC -Hgb/Hct Trend: Recent Labs  Lab 01/28/23 2030 01/29/23 0919 01/30/23 0505 01/31/23 0957 02/01/23 0602 02/02/23 0527 02/03/23 0512  HGB 8.8* 9.3* 9.9* 9.3* 8.9* 8.6* 9.4*  HCT 27.9* 29.2* 31.0* 30.1* 28.7* 27.4* 30.2*  MCV  --  88.8 88.3 90.4 89.4 89.8 90.7  -Follow H&H. -Transfusion threshold hemoglobin < 8. -Continue to monitor for signs and symptoms of bleeding; no overt bleeding noted -Repeat CBC in a.m.   History of Pancreatic Cancer -Outpatient follow-up with Oncology. -Oncology Dr. Burr Medico notified of admission via epic and I discussed the case with Dr. Burr Medico and patient was supposed to have a Port-A-Cath placed however Dr. Burr Medico take care of this in the outpatient setting early next week. -Palliative Care consulted for further [place] discussion and remains full code with full scope of care with currently continuing current plan of care.  Palliative care to follow-up and support and will see the patient  on 02/06/2022.  In terms of her symptom management they are starting MS Contin 15 mg every 12 hours and recommending continuing Oxy IR 5-10 mg every 4 hours as needed for breakthrough pain; patient also has IV morphine 4 mg every 6 as needed severe pain -Pain was still uncontrolled and she has just gotten a few doses of MS IR but this is improving -Oncology  has arranged for an outpatient Port-A-Cath which was to be done in the morning however will send a message to the interventional radiology team given that she is still in the hospital and continues to have uncontrolled pain that prohibits her from being discharged. -Pain is still uncontrolled and IR states that they cannot do the Port-A-Cath placement today so they will try it tomorrow.   Hypokalemia, improved  -Patient's K+ Level Trend: Recent Labs  Lab 01/28/23 0517 01/29/23 0919 01/30/23 0505 01/31/23 0957 02/01/23 0602 02/02/23 0527 02/03/23 0512  K 3.6 3.7 4.2 3.9 3.9 4.1 4.5  -Mag Level was 2.0 -Continue to Monitor and Replete as Necessary -Repeat CMP in the AM    Hypoalbuminemia -Patient's Albumin Trend: Recent Labs  Lab 01/28/23 0517 01/29/23 0919 01/30/23 0505 01/31/23 0957 02/01/23 0602 02/02/23 0527 02/03/23 0512  WBC 7.6 8.8 9.5 8.8 8.2 7.6 9.6  -Continue to Monitor and Trend and repeat CMP in the AM    Overweight  -Complicates overall prognosis and care -Estimated body mass index is 28.66 kg/m as calculated from the following:   Height as of this encounter: 5' 8"$  (1.727 m).   Weight as of this encounter: 85.5 kg.  -Weight Loss and Dietary Counseling given   GERD/GI Prophylaxis -C/w Pantoprazole 40 mg po Daily    DVT prophylaxis:  apixaban (ELIQUIS) tablet 5 mg    Code Status: Full Code Family Communication: No family currently at bedside  Disposition Plan:  Level of care: Telemetry Status is: Inpatient Remains inpatient appropriate because: Continues have uncontrolled pain and wanting further pain medication adjustment.  Plan was for Port-A-Cath today however unfortunate cannot be done so likely will be done tomorrow   Consultants:  Pulmonary Interventional radiology Palliative care medicine I discussed the case with her primary oncologist Dr. Burr Medico  Procedures:  As delineated as above  Antimicrobials:  Anti-infectives (From admission,  onward)    Start     Dose/Rate Route Frequency Ordered Stop   01/28/23 1000  cefdinir (OMNICEF) capsule 300 mg        300 mg Oral Every 12 hours 01/28/23 0848 01/29/23 2145   01/26/23 2200  ceFEPIme (MAXIPIME) 2 g in sodium chloride 0.9 % 100 mL IVPB  Status:  Discontinued        2 g 200 mL/hr over 30 Minutes Intravenous Every 12 hours 01/26/23 1012 01/28/23 0848   01/25/23 1500  ceFEPIme (MAXIPIME) 2 g in sodium chloride 0.9 % 100 mL IVPB  Status:  Discontinued        2 g 200 mL/hr over 30 Minutes Intravenous Every 8 hours 01/25/23 1215 01/26/23 1012   01/25/23 1200  vancomycin (VANCOREADY) IVPB 1500 mg/300 mL  Status:  Discontinued        1,500 mg 150 mL/hr over 120 Minutes Intravenous Every 24 hours 01/24/23 1425 01/26/23 0838   01/24/23 2300  ceFEPIme (MAXIPIME) 2 g in sodium chloride 0.9 % 100 mL IVPB  Status:  Discontinued        2 g 200 mL/hr over 30 Minutes Intravenous Every 12 hours 01/24/23 1422 01/25/23 1215  01/24/23 1115  vancomycin (VANCOREADY) IVPB 1500 mg/300 mL        1,500 mg 150 mL/hr over 120 Minutes Intravenous  Once 01/24/23 1103 01/24/23 1405   01/24/23 1100  ceFEPIme (MAXIPIME) 2 g in sodium chloride 0.9 % 100 mL IVPB        2 g 200 mL/hr over 30 Minutes Intravenous  Once 01/24/23 1050 01/24/23 1132       Subjective: Seen and examined at bedside and she was having a coughing fit this morning and states her pain was extremely uncontrolled.  Unfortunate did not get her pain medication for unclear reasons overnight.  States that she does not feel well today.  Plan is for Port-A-Cath today however unfortunate cannot be done to likely be done tomorrow.  No other concerns or complaints at this time.  Objective: Vitals:   02/02/23 1043 02/02/23 1156 02/02/23 1930 02/03/23 1359  BP:  129/76  109/76  Pulse:  (!) 101  (!) 106  Resp:  17  18  Temp:    97.8 F (36.6 C)  TempSrc:    Oral  SpO2: 91% 94% 100% 93%  Weight:      Height:       No intake or output data  in the 24 hours ending 02/03/23 1508 Filed Weights   01/26/23 0500 01/27/23 0500 01/30/23 0500  Weight: 87.6 kg 84.8 kg 85.5 kg   Examination: Physical Exam:  Constitutional: WN/WD overweight African-American female currently no acute distress appears uncomfortable and in pain and she is having a coughing fit Respiratory: Diminished to auscultation bilaterally with coarse breath sounds and rhonchi but no appreciable rales, wheezing, or crackles. Normal respiratory effort and patient is not tachypenic. No accessory muscle use.  Unlabored breathing but is having a coughing fit Cardiovascular: RRR, no murmurs / rubs / gallops. S1 and S2 auscultated.  Mild lower extremity edema Abdomen: Soft, non-tender, distended secondary to body habitus bowel sounds positive.  GU: Deferred. Musculoskeletal: No clubbing / cyanosis of digits/nails. No joint deformity upper and lower extremities. Skin: No rashes, lesions, ulcers on a limited skin evaluation. No induration; Warm and dry.  Neurologic: CN 2-12 grossly intact with no focal deficits. \Romberg sign cerebellar reflexes not assessed.  Psychiatric: Normal judgment and insight. Alert and oriented x 3.  Anxious mood and appropriate affect.   Data Reviewed: I have personally reviewed following labs and imaging studies  CBC: Recent Labs  Lab 01/30/23 0505 01/31/23 0957 02/01/23 0602 02/02/23 0527 02/03/23 0512  WBC 9.5 8.8 8.2 7.6 9.6  NEUTROABS 7.0 6.8 6.2 5.8 6.0  HGB 9.9* 9.3* 8.9* 8.6* 9.4*  HCT 31.0* 30.1* 28.7* 27.4* 30.2*  MCV 88.3 90.4 89.4 89.8 90.7  PLT 263 204 214 196 [id number]   Basic Metabolic Panel: Recent Labs  Lab 01/30/23 0505 01/31/23 0957 02/01/23 0602 02/02/23 0527 02/03/23 0512  NA 135 135 135 138 137  K 4.2 3.9 3.9 4.1 4.5  CL 95* 96* 95* 99 98  CO2 28 29 29 29 29  $ GLUCOSE 195* 245* 194* 166* 155*  BUN 25* 30* 30* 26* 20  CREATININE 1.09* 1.06* 1.02* 0.87 0.93  CALCIUM 9.0 8.9 8.7* 8.8* 8.9  MG 2.0 2.0 2.0 2.0 2.3   PHOS 4.0 4.0 4.6 3.9 4.1   GFR: Estimated Creatinine Clearance: 56.1 mL/min (by C-G formula based on SCr of 0.93 mg/dL). Liver Function Tests: Recent Labs  Lab 01/30/23 0505 01/31/23 0957 02/01/23 0602 02/02/23 0527 02/03/23 0512  AST 81* 68* 83*  89* 95*  ALT 47* 47* 57* 59* 66*  ALKPHOS 188* 186* 207* 222* 245*  BILITOT 1.1 1.2 1.1 1.0 1.3*  PROT 6.8 6.7 7.0 6.4* 7.7  ALBUMIN 3.0* 2.8* 2.8* 2.7* 3.0*   No results for input(s): "LIPASE", "AMYLASE" in the last 168 hours. No results for input(s): "AMMONIA" in the last 168 hours. Coagulation Profile: No results for input(s): "INR", "PROTIME" in the last 168 hours. Cardiac Enzymes: No results for input(s): "CKTOTAL", "CKMB", "CKMBINDEX", "TROPONINI" in the last 168 hours. BNP (last 3 results) No results for input(s): "PROBNP" in the last 8760 hours. HbA1C: No results for input(s): "HGBA1C" in the last 72 hours. CBG: Recent Labs  Lab 02/02/23 1133 02/02/23 1638 02/02/23 2155 02/03/23 0819 02/03/23 1133  GLUCAP 281* 114* 173* 160* 181*   Lipid Profile: No results for input(s): "CHOL", "HDL", "LDLCALC", "TRIG", "CHOLHDL", "LDLDIRECT" in the last 72 hours. Thyroid Function Tests: No results for input(s): "TSH", "T4TOTAL", "FREET4", "T3FREE", "THYROIDAB" in the last 72 hours. Anemia Panel: No results for input(s): "VITAMINB12", "FOLATE", "FERRITIN", "TIBC", "IRON", "RETICCTPCT" in the last 72 hours. Sepsis Labs: No results for input(s): "PROCALCITON", "LATICACIDVEN" in the last 168 hours.  Recent Results (from the past 240 hour(s))  MRSA Next Gen by PCR, Nasal     Status: None   Collection Time: 01/25/23  1:15 PM   Specimen: Nasal Mucosa; Nasal Swab  Result Value Ref Range Status   MRSA by PCR Next Gen NOT DETECTED NOT DETECTED Final    Comment: (NOTE) The GeneXpert MRSA Assay (FDA approved for NASAL specimens only), is one component of a comprehensive MRSA colonization surveillance program. It is not intended to  diagnose MRSA infection nor to guide or monitor treatment for MRSA infections. Test performance is not FDA approved in patients less than 62 years old. Performed at [facility], [place] [address] [place], [place] [address]     Radiology Studies: DG CHEST PORT 1 VIEW  Result Date: 02/03/2023 CLINICAL DATA:  sob shortness of breath EXAM: PORTABLE CHEST - 1 VIEW COMPARISON:  02/02/2023 FINDINGS: Scattered interstitial and airspace opacities throughout both lungs, involving bases more than apices, grossly stable compared to the previous radiograph. Heart size and mediastinal contours are within normal limits. Aortic Atherosclerosis (ICD10-170.0). No effusion. Visualized bones unremarkable.  Surgical clips right breast. IMPRESSION: Stable bilateral interstitial and airspace opacities. No acute findings. Electronically Signed   By: Lucrezia Europe M.D.   On: 02/03/2023 08:18   DG CHEST PORT 1 VIEW  Result Date: 02/02/2023 CLINICAL DATA:  Shortness of breath EXAM: PORTABLE CHEST 1 VIEW COMPARISON:  02/01/2023 and prior studies FINDINGS: The cardiomediastinal silhouette is unchanged. Bilateral interstitial/mild airspace opacities again noted, slightly increased on the RIGHT. There is no evidence of pneumothorax or large pleural effusion. No other changes are noted. IMPRESSION: Bilateral interstitial/mild airspace opacities, slightly increased on the RIGHT. Electronically Signed   By: Margarette Canada M.D.   On: 02/02/2023 08:25    Scheduled Meds:  ALPRAZolam  0.5 mg Oral BID   apixaban  5 mg Oral BID   arformoterol  15 mcg Nebulization BID   benzonatate  200 mg Oral TID   budesonide (PULMICORT) nebulizer solution  0.5 mg Nebulization BID   ferrous sulfate  325 mg Oral Daily   furosemide  20 mg Oral Daily   guaiFENesin  1,200 mg Oral BID   insulin aspart  0-15 Units Subcutaneous TID WC   insulin aspart  0-5 Units Subcutaneous QHS   ipratropium  0.5 mg Nebulization BID   levalbuterol  0.63  mg Nebulization BID   losartan  100 mg Oral Daily   morphine  15 mg Oral Q12H   pantoprazole  40 mg Oral Daily   polyethylene glycol  17 g Oral Daily   senna-docusate  1 tablet Oral BID   Continuous Infusions:   LOS: 9 days   Raiford Noble, DO [facility] Available via Epic secure chat 7am-7pm After these hours, please refer to coverage provider listed on [email or web address] 02/03/2023, 3:08 PM

## 2023-02-03 NOTE — Progress Notes (Signed)
Victoria [name]   DOB:August 23, 1941   [id number]   [id number]  Oncology follow up   Subjective: Patient is known to me, I met her in the hospital during her last admission.  She was readmitted on 01/24/2023 for dyspnea.  She has been treated for multifocal pneumonia.  She was initially planned to be discharged yesterday, but became more dyspneic and was evaluated by pulmonary.  When I saw her in the late afternoon today, she states she feels better, not on oxygen supplement, she appears to be anxious, she complains she is not getting pain medication quickly when she request, especially at night.    Objective:  Vitals:   02/02/23 1930 02/03/23 1359  BP:  109/76  Pulse:  (!) 106  Resp:  18  Temp:  97.8 F (36.6 C)  SpO2: 100% 93%    Body mass index is 28.66 kg/m. No intake or output data in the 24 hours ending 02/03/23 2040   Sclerae unicteric  Oropharynx clear  Abdomen soft   MSK no focal spinal tenderness, no peripheral edema  Neuro nonfocal    CBG (last 3)  Recent Labs    02/03/23 0819 02/03/23 1133 02/03/23 1625  GLUCAP 160* 181* 128*     Labs:  Urine Studies No results for input(s): "UHGB", "CRYS" in the last 72 hours.  Invalid input(s): "UACOL", "UAPR", "USPG", "UPH", "UTP", "UGL", "UKET", "UBIL", "UNIT", "UROB", "ULEU", "UEPI", "UWBC", "URBC", "UBAC", "CAST", "UCOM", "BILUA"  Basic Metabolic Panel: Recent Labs  Lab 01/30/23 0505 01/31/23 0957 02/01/23 0602 02/02/23 0527 02/03/23 0512  NA 135 135 135 138 137  K 4.2 3.9 3.9 4.1 4.5  CL 95* 96* 95* 99 98  CO2 28 29 29 29 29  $ GLUCOSE 195* 245* 194* 166* 155*  BUN 25* 30* 30* 26* 20  CREATININE 1.09* 1.06* 1.02* 0.87 0.93  CALCIUM 9.0 8.9 8.7* 8.8* 8.9  MG 2.0 2.0 2.0 2.0 2.3  PHOS 4.0 4.0 4.6 3.9 4.1   GFR Estimated Creatinine Clearance: 56.1 mL/min (by C-G formula based on SCr of 0.93 mg/dL). Liver Function Tests: Recent Labs  Lab 01/30/23 0505 01/31/23 0957 02/01/23 0602 02/02/23 0527  02/03/23 0512  AST 81* 68* 83* 89* 95*  ALT 47* 47* 57* 59* 66*  ALKPHOS 188* 186* 207* 222* 245*  BILITOT 1.1 1.2 1.1 1.0 1.3*  PROT 6.8 6.7 7.0 6.4* 7.7  ALBUMIN 3.0* 2.8* 2.8* 2.7* 3.0*   No results for input(s): "LIPASE", "AMYLASE" in the last 168 hours. No results for input(s): "AMMONIA" in the last 168 hours. Coagulation profile No results for input(s): "INR", "PROTIME" in the last 168 hours.  CBC: Recent Labs  Lab 01/30/23 0505 01/31/23 0957 02/01/23 0602 02/02/23 0527 02/03/23 0512  WBC 9.5 8.8 8.2 7.6 9.6  NEUTROABS 7.0 6.8 6.2 5.8 6.0  HGB 9.9* 9.3* 8.9* 8.6* 9.4*  HCT 31.0* 30.1* 28.7* 27.4* 30.2*  MCV 88.3 90.4 89.4 89.8 90.7  PLT 263 204 214 196 231   Cardiac Enzymes: No results for input(s): "CKTOTAL", "CKMB", "CKMBINDEX", "TROPONINI" in the last 168 hours. BNP: Invalid input(s): "POCBNP" CBG: Recent Labs  Lab 02/02/23 1638 02/02/23 2155 02/03/23 0819 02/03/23 1133 02/03/23 1625  GLUCAP 114* 173* 160* 181* 128*   D-Dimer No results for input(s): "DDIMER" in the last 72 hours. Hgb A1c No results for input(s): "HGBA1C" in the last 72 hours. Lipid Profile No results for input(s): "CHOL", "HDL", "LDLCALC", "TRIG", "CHOLHDL", "LDLDIRECT" in the last 72 hours. Thyroid function studies No results for  input(s): "TSH", "T4TOTAL", "T3FREE", "THYROIDAB" in the last 72 hours.  Invalid input(s): "FREET3" Anemia work up No results for input(s): "VITAMINB12", "FOLATE", "FERRITIN", "TIBC", "IRON", "RETICCTPCT" in the last 72 hours. Microbiology Recent Results (from the past 240 hour(s))  MRSA Next Gen by PCR, Nasal     Status: None   Collection Time: 01/25/23  1:15 PM   Specimen: Nasal Mucosa; Nasal Swab  Result Value Ref Range Status   MRSA by PCR Next Gen NOT DETECTED NOT DETECTED Final    Comment: (NOTE) The GeneXpert MRSA Assay (FDA approved for NASAL specimens only), is one component of a comprehensive MRSA colonization surveillance program. It is  not intended to diagnose MRSA infection nor to guide or monitor treatment for MRSA infections. Test performance is not FDA approved in patients less than 96 years old. Performed at [facility], [place] [address], [place], [place] [address]       Studies:  DG CHEST PORT 1 VIEW  Result Date: 02/03/2023 CLINICAL DATA:  sob shortness of breath EXAM: PORTABLE CHEST - 1 VIEW COMPARISON:  02/02/2023 FINDINGS: Scattered interstitial and airspace opacities throughout both lungs, involving bases more than apices, grossly stable compared to the previous radiograph. Heart size and mediastinal contours are within normal limits. Aortic Atherosclerosis (ICD10-170.0). No effusion. Visualized bones unremarkable.  Surgical clips right breast. IMPRESSION: Stable bilateral interstitial and airspace opacities. No acute findings. Electronically Signed   By: Lucrezia Europe M.D.   On: 02/03/2023 08:18   DG CHEST PORT 1 VIEW  Result Date: 02/02/2023 CLINICAL DATA:  Shortness of breath EXAM: PORTABLE CHEST 1 VIEW COMPARISON:  02/01/2023 and prior studies FINDINGS: The cardiomediastinal silhouette is unchanged. Bilateral interstitial/mild airspace opacities again noted, slightly increased on the RIGHT. There is no evidence of pneumothorax or large pleural effusion. No other changes are noted. IMPRESSION: Bilateral interstitial/mild airspace opacities, slightly increased on the RIGHT. Electronically Signed   By: Margarette Canada M.D.   On: 02/02/2023 08:25    Assessment: 82 y.o.   Multifocal pneumonia Diastolic CHF exacerbation Acute respiratory failure with hypoxia secondary to #1-2, improved Newly diagnosed metastatic pancreatic cancer Hypertension Type 2 diabetes History of DVT/PE Liver cirrhosis from hepatitis C Moderate protein calorie malnutrition   Plan:  -She has been in the hospital for 10 days, overall improved -I again reviewed her pancreatic cancer diagnosis and staging, unfortunately she  has retroperitoneal lymph node metastasis, and possible lung metastasis.  Her pancreatic cancer is not curable. -Due to her multiple medical comorbidities, her advanced age, poor performance status, she is not a candidate for intensive chemotherapy.  I again discussed with the option of single agent gemcitabine, versus supportive care/hospice.  She expressed her wishes to try chemo.  I will see her back in office later this week, to prepare her chemo. -if IR can do port placement before discharge, it will be great. But I do not want to hold on her discharge for port placement, we can schedule after her discharge.  Patient is agreeable to start chemo with peripheral access.   Truitt Merle, MD 02/03/2023

## 2023-02-04 ENCOUNTER — Telehealth: Payer: Self-pay

## 2023-02-04 ENCOUNTER — Inpatient Hospital Stay (HOSPITAL_COMMUNITY): Payer: Medicare HMO

## 2023-02-04 ENCOUNTER — Other Ambulatory Visit: Payer: Self-pay

## 2023-02-04 DIAGNOSIS — E119 Type 2 diabetes mellitus without complications: Secondary | ICD-10-CM | POA: Diagnosis not present

## 2023-02-04 DIAGNOSIS — C259 Malignant neoplasm of pancreas, unspecified: Secondary | ICD-10-CM | POA: Diagnosis not present

## 2023-02-04 DIAGNOSIS — J189 Pneumonia, unspecified organism: Secondary | ICD-10-CM | POA: Diagnosis not present

## 2023-02-04 DIAGNOSIS — I5031 Acute diastolic (congestive) heart failure: Secondary | ICD-10-CM | POA: Diagnosis not present

## 2023-02-04 LAB — CBC WITH DIFFERENTIAL/PLATELET
Abs Immature Granulocytes: 0.05 10*3/uL (ref 0.00–0.07)
Basophils Absolute: 0 10*3/uL (ref 0.0–0.1)
Basophils Relative: 0 %
Eosinophils Absolute: 0.2 10*3/uL (ref 0.0–0.5)
Eosinophils Relative: 2 %
HCT: 28 % — ABNORMAL LOW (ref 36.0–46.0)
Hemoglobin: 8.7 g/dL — ABNORMAL LOW (ref 12.0–15.0)
Immature Granulocytes: 1 %
Lymphocytes Relative: 12 %
Lymphs Abs: 1 10*3/uL (ref 0.7–4.0)
MCH: 27.9 pg (ref 26.0–34.0)
MCHC: 31.1 g/dL (ref 30.0–36.0)
MCV: 89.7 fL (ref 80.0–100.0)
Monocytes Absolute: 0.9 10*3/uL (ref 0.1–1.0)
Monocytes Relative: 10 %
Neutro Abs: 6.8 10*3/uL (ref 1.7–7.7)
Neutrophils Relative %: 75 %
Platelets: 201 10*3/uL (ref 150–400)
RBC: 3.12 MIL/uL — ABNORMAL LOW (ref 3.87–5.11)
RDW: 17.5 % — ABNORMAL HIGH (ref 11.5–15.5)
WBC: 9 10*3/uL (ref 4.0–10.5)
nRBC: 0 % (ref 0.0–0.2)

## 2023-02-04 LAB — COMPREHENSIVE METABOLIC PANEL
ALT: 65 U/L — ABNORMAL HIGH (ref 0–44)
AST: 85 U/L — ABNORMAL HIGH (ref 15–41)
Albumin: 2.7 g/dL — ABNORMAL LOW (ref 3.5–5.0)
Alkaline Phosphatase: 255 U/L — ABNORMAL HIGH (ref 38–126)
Anion gap: 12 (ref 5–15)
BUN: 19 mg/dL (ref 8–23)
CO2: 26 mmol/L (ref 22–32)
Calcium: 8.3 mg/dL — ABNORMAL LOW (ref 8.9–10.3)
Chloride: 96 mmol/L — ABNORMAL LOW (ref 98–111)
Creatinine, Ser: 0.89 mg/dL (ref 0.44–1.00)
GFR, Estimated: >60 mL/min (ref >60)
Glucose, Bld: 186 mg/dL — ABNORMAL HIGH (ref 70–99)
Potassium: 3.9 mmol/L (ref 3.5–5.1)
Sodium: 134 mmol/L — ABNORMAL LOW (ref 135–145)
Total Bilirubin: 1.1 mg/dL (ref 0.3–1.2)
Total Protein: 6.8 g/dL (ref 6.5–8.1)

## 2023-02-04 LAB — GLUCOSE, CAPILLARY
Glucose-Capillary: 165 mg/dL — ABNORMAL HIGH (ref 70–99)
Glucose-Capillary: 229 mg/dL — ABNORMAL HIGH (ref 70–99)

## 2023-02-04 LAB — PHOSPHORUS: Phosphorus: 3.6 mg/dL (ref 2.5–4.6)

## 2023-02-04 LAB — MAGNESIUM: Magnesium: 2 mg/dL (ref 1.7–2.4)

## 2023-02-04 MED ORDER — FUROSEMIDE 10 MG/ML IJ SOLN
40.0000 mg | Freq: Once | INTRAMUSCULAR | Status: AC
  Administered 2023-02-04: 40 mg via INTRAVENOUS
  Filled 2023-02-04: qty 4

## 2023-02-04 MED ORDER — LOSARTAN POTASSIUM 100 MG PO TABS: 100.0000 mg | ORAL_TABLET | Freq: Every day | ORAL | 0 refills | Status: DC

## 2023-02-04 MED ORDER — POLYETHYLENE GLYCOL 3350 17 G PO PACK: 17.0000 g | PACK | Freq: Every day | ORAL | 0 refills | Status: DC

## 2023-02-04 MED ORDER — SENNOSIDES-DOCUSATE SODIUM 8.6-50 MG PO TABS: 1.0000 | ORAL_TABLET | Freq: Every day | ORAL | 0 refills | Status: DC

## 2023-02-04 MED ORDER — ONDANSETRON HCL 4 MG PO TABS: 4.0000 mg | ORAL_TABLET | Freq: Four times a day (QID) | ORAL | 0 refills | Status: DC | PRN

## 2023-02-04 MED ORDER — FUROSEMIDE 20 MG PO TABS: 20.0000 mg | ORAL_TABLET | Freq: Every day | ORAL | 0 refills | Status: DC

## 2023-02-04 NOTE — Telephone Encounter (Signed)
Per Dr. Burr Medico reschedule pt to Thursday 02/06/2023 d/t pt d/c from Hospital today. Called pt to inform pt of appt changes. LVM stating the changes to the pt's appt schedule.  Instructed pt to call Dr. Ernestina Penna office should she have additional questions or concerns.

## 2023-02-04 NOTE — Plan of Care (Signed)

## 2023-02-04 NOTE — Progress Notes (Incomplete)
[facility]   [phone] [phone]   Clinic Follow up Note   Patient Care Team: Elby Showers, MD as PCP - General (Internal Medicine) Inda Castle, MD (Inactive) as Consulting Physician (Gastroenterology) Truitt Merle, MD as Consulting Physician (Oncology)  Date of Service:  02/04/2023  CHIEF COMPLAINT: f/u of {diagnosis, copy from prior note}  CURRENT THERAPY:  {Usually copy/paste from prior note, but pay attention to if treatment changes. Or change to Surveillance if they finished treatment}  ASSESSMENT: *** Victoria [name] is a 82 y.o. female with   No problem-specific Assessment & Plan notes found for this encounter.  ***   PLAN: {Everything Dr. Burr Medico talks to pt about, including reviewing scans and labs. } -{proceed with ***} -{lab with/without flush and f/u when?}   SUMMARY OF ONCOLOGIC HISTORY: Oncology History  Pancreatic cancer ([place])  01/14/2023 Initial Diagnosis   Pancreatic cancer ([place])   01/27/2023 -  Chemotherapy   Patient is on Treatment Plan : PANCREAS Gemcitabine D1,8,15 (1000) q28d x 4 Cycles        INTERVAL HISTORY: *** Victoria [name] is here for a follow up of {diagnosis} She was last seen by {provider} on {date} She presents to the clinic {alone/accompanied by}. {Everything the pt says, basically. How they're feeling, complaints and concerns, etc}   All other systems were reviewed with the patient and are negative.  MEDICAL HISTORY:  Past Medical History:  Diagnosis Date  . Abnormal finding on Pap smear   . Allergy    SEASONAL  . Anxiety   . Breast cancer ([place]) 2000   right breast lumptectomy, radiation done  . Cirrhosis ([place]) 11/2012   resolved per pt  . Diabetes mellitus type 2, controlled ([place])   . Hepatitis C    took tx for   . Hypertension   . Obesity   . Thrombocytopenia ([place]) 11/2012    SURGICAL HISTORY: Past Surgical History:  Procedure Laterality Date  . ANTERIOR AND POSTERIOR  REPAIR N/A 02/28/2021   Procedure: ANTERIOR (CYSTOCELE) AND POSTERIOR REPAIR (RECTOCELE);  Surgeon: Cheri Fowler, MD;  Location: [facility];  Service: Gynecology;  Laterality: N/A;  . BIOPSY  01/09/2023   Procedure: BIOPSY;  Surgeon: Rush Landmark Telford Nab., MD;  Location: [facility];  Service: Gastroenterology;;  . BREAST BIOPSY Left 09/09/2016  . BREAST LUMPECTOMY  right  . BREAST LUMPECTOMY    . BREAST REDUCTION SURGERY  2005  . CATARACT EXTRACTION, BILATERAL  2014  . CHOLECYSTECTOMY N/A 03/30/2013   Procedure: LAPAROSCOPIC CHOLECYSTECTOMY WITH INTRAOPERATIVE CHOLANGIOGRAM;  Surgeon: Shann Medal, MD;  Location: [facility];  Service: General;  Laterality: N/A;  . COLONOSCOPY  2021  . CYSTOSCOPY N/A 02/28/2021   Procedure: CYSTOSCOPY;  Surgeon: Cheri Fowler, MD;  Location: [facility];  Service: Gynecology;  Laterality: N/A;  . CYSTOSCOPY W/ RETROGRADES Left 02/28/2021   Procedure: CYSTOSCOPY WITH RETROGRADE PYELOGRAM;  Surgeon: Cheri Fowler, MD;  Location: [place];  Service: Gynecology;  Laterality: Left;  . ESOPHAGOGASTRODUODENOSCOPY (EGD) WITH PROPOFOL N/A 11/25/2022   Procedure: ESOPHAGOGASTRODUODENOSCOPY (EGD) WITH PROPOFOL;  Surgeon: Carol Ada, MD;  Location: [place];  Service: Gastroenterology;  Laterality: N/A;  . ESOPHAGOGASTRODUODENOSCOPY (EGD) WITH PROPOFOL N/A 01/09/2023   Procedure: ESOPHAGOGASTRODUODENOSCOPY (EGD) WITH PROPOFOL;  Surgeon: Rush Landmark Telford Nab., MD;  [facility]: [facility];  Service: Gastroenterology;  Laterality: N/A;  . EUS  01/28/2013   Procedure: UPPER ENDOSCOPIC ULTRASOUND (EUS) LINEAR;  Surgeon: Milus Banister, MD;  Location: [facility];  Service: Endoscopy;  Laterality: N/A;  ride-adrian  [place] [phone]  . EUS Left 11/25/2022   Procedure: UPPER ENDOSCOPIC ULTRASOUND (EUS) LINEAR;  Surgeon: Carol Ada, MD;  Location: [place];  Service: Gastroenterology;  Laterality: Left;  .  EUS N/A 01/09/2023   Procedure: UPPER ENDOSCOPIC ULTRASOUND (EUS) LINEAR;  Surgeon: Irving Copas., MD;  Location: [facility];  Service: Gastroenterology;  Laterality: N/A;  . EXCISION OF SKIN TAG Right 03/30/2013   Procedure: EXCISION OF SKIN TAG;  Surgeon: Shann Medal, MD;  Location: [facility];  Service: General;  Laterality: Right;  . FINE NEEDLE ASPIRATION  11/25/2022   Procedure: FINE NEEDLE ASPIRATION (FNA) LINEAR;  Surgeon: Carol Ada, MD;  Location: [place];  Service: Gastroenterology;;  . FINE NEEDLE ASPIRATION N/A 01/09/2023   Procedure: FINE NEEDLE ASPIRATION (FNA) LINEAR;  Surgeon: Irving Copas., MD;  [facility];  Service: Gastroenterology;  Laterality: N/A;  . LIVER BIOPSY  03/30/2013   Procedure: LIVER BIOPSY;  Surgeon: Shann Medal, MD;  Location: [facility];  Service: General;;  . POLYPECTOMY    . REDUCTION MAMMAPLASTY    . THYROIDECTOMY, PARTIAL  1972   left  . VAGINAL HYSTERECTOMY N/A 02/28/2021   Procedure: HYSTERECTOMY VAGINAL WITH UNILATERAL SALPINGECTOMY;  Surgeon: Cheri Fowler, MD;  Location: [place];  Service: Gynecology;  Laterality: N/A;    I have reviewed the social history and family history with the patient and they are unchanged from previous note.  ALLERGIES:  is allergic to lisinopril.  MEDICATIONS:  No current facility-administered medications for this visit.   Current Outpatient Medications  Medication Sig Dispense Refill  . furosemide (LASIX) 20 MG tablet Take 1 tablet (20 mg total) by mouth daily. 30 tablet 0  . [START ON 02/05/2023] losartan (COZAAR) 100 MG tablet Take 1 tablet (100 mg total) by mouth daily. 30 tablet 0  . morphine (MS CONTIN) 15 MG 12 hr tablet Take 1 tablet (15 mg total) by mouth every 12 (twelve) hours. 30 tablet 0  . ondansetron (ZOFRAN) 4 MG tablet Take 1 tablet (4 mg total) by mouth every 6 (six) hours as needed for nausea. 20 tablet 0  . oxyCODONE (OXY IR/ROXICODONE) 5  MG immediate release tablet Take 1-2 tablets (5-10 mg total) by mouth every 4 (four) hours as needed for moderate pain, severe pain or breakthrough pain. 30 tablet 0  . [START ON 02/05/2023] polyethylene glycol (MIRALAX / GLYCOLAX) 17 g packet Take 17 g by mouth daily. 14 each 0  . senna-docusate (SENOKOT-S) 8.6-50 MG tablet Take 1 tablet by mouth at bedtime. 30 tablet 0   Facility-Administered Medications Ordered in Other Visits  Medication Dose Route Frequency Provider Last Rate Last Admin  . acetaminophen (TYLENOL) tablet 650 mg  650 mg Oral Q6H PRN Marylyn Ishihara, Tyrone A, DO       Or  . acetaminophen (TYLENOL) suppository 650 mg  650 mg Rectal Q6H PRN Marylyn Ishihara, Tyrone A, DO      . albuterol (PROVENTIL) (2.5 MG/3ML) 0.083% nebulizer solution 2.5 mg  2.5 mg Inhalation Q6H PRN Marylyn Ishihara, Tyrone A, DO      . ALPRAZolam [name]) tablet 0.5 mg  0.5 mg Oral BID Marylyn Ishihara, Tyrone A, DO   0.5 mg at 02/04/23 1015  . apixaban (ELIQUIS) tablet 5 mg  5 mg Oral BID Marylyn Ishihara, Tyrone A, DO   5 mg at 02/04/23 1014  . arformoterol (BROVANA) nebulizer solution 15 mcg  15 mcg Nebulization BID Alfredia Ferguson,  Bertram Savin, DO   15 mcg at 02/04/23 [phone]  . benzonatate (TESSALON) capsule 200 mg  200 mg Oral TID Marylyn Ishihara, Tyrone A, DO   200 mg at 02/04/23 1014  . budesonide (PULMICORT) nebulizer solution 0.5 mg  0.5 mg Nebulization BID Sheikh, Omair Latif, DO   0.5 mg at 02/04/23 0830  . ferrous sulfate tablet 325 mg  325 mg Oral Daily Kyle, Tyrone A, DO   325 mg at 02/04/23 1015  . guaiFENesin (MUCINEX) 12 hr tablet 1,200 mg  1,200 mg Oral BID Raiford Noble [place], DO   1,200 mg at 02/04/23 1014  . hydrALAZINE (APRESOLINE) injection 10 mg  10 mg Intravenous Q8H PRN Marylyn Ishihara, Tyrone A, DO      . insulin aspart (novoLOG) injection 0-15 Units  0-15 Units Subcutaneous TID WC Kyle, Tyrone A, DO   5 Units at 02/04/23 1208  . insulin aspart (novoLOG) injection 0-5 Units  0-5 Units Subcutaneous QHS Kyle, Tyrone A, DO   2 Units at 02/03/23 2153  . ipratropium (ATROVENT)  nebulizer solution 0.5 mg  0.5 mg Nebulization BID Raiford Noble Latif, DO   0.5 mg at 02/04/23 0827  . levalbuterol (XOPENEX) nebulizer solution 0.63 mg  0.63 mg Nebulization BID Raiford Noble [place], DO   0.63 mg at 02/04/23 0825  . losartan (COZAAR) tablet 100 mg  100 mg Oral Daily Kyle, Tyrone A, DO   100 mg at 02/04/23 1014  . morphine (MS CONTIN) 12 hr tablet 15 mg  15 mg Oral Q12H Pickenpack-Cousar, Athena N, NP   15 mg at 02/04/23 1015  . morphine (PF) 4 MG/ML injection 4 mg  4 mg Intravenous Q6H PRN Pickenpack-Cousar, Athena N, NP   4 mg at 02/04/23 1144  . ondansetron (ZOFRAN) tablet 4 mg  4 mg Oral Q6H PRN Marylyn Ishihara, Tyrone A, DO       Or  . ondansetron (ZOFRAN) injection 4 mg  4 mg Intravenous Q6H PRN Marylyn Ishihara, Tyrone A, DO   4 mg at 01/25/23 0550  . oxyCODONE (Oxy IR/ROXICODONE) immediate release tablet 5-10 mg  5-10 mg Oral Q4H PRN Pickenpack-Cousar, Carlena Sax, NP   5 mg at 02/03/23 1845  . pantoprazole (PROTONIX) EC tablet 40 mg  40 mg Oral Daily Kyle, Tyrone A, DO   40 mg at 02/04/23 1014  . polyethylene glycol (MIRALAX / GLYCOLAX) packet 17 g  17 g Oral Daily Eugenie Filler, MD   17 g at 02/04/23 1020  . senna-docusate (Senokot-S) tablet 1 tablet  1 tablet Oral BID Eugenie Filler, MD   1 tablet at 02/04/23 1015    PHYSICAL EXAMINATION: ECOG PERFORMANCE STATUS: {CHL ONC ECOG [phone]}  There were no vitals filed for this visit. Wt Readings from Last 3 Encounters:  01/30/23 188 lb 7.9 oz (85.5 kg)  12/27/22 185 lb (83.9 kg)  12/04/22 188 lb (85.3 kg)    {Only keep what was examined. If exam not performed, can use .CEXAM } GENERAL:alert, no distress and comfortable SKIN: skin color, texture, turgor are normal, no rashes or significant lesions EYES: normal, Conjunctiva are pink and non-injected, sclera clear {OROPHARYNX:no exudate, no erythema and lips, buccal mucosa, and tongue normal}  NECK: supple, thyroid normal size, non-tender, without nodularity LYMPH:  no palpable  lymphadenopathy in the cervical, axillary {or inguinal} LUNGS: clear to auscultation and percussion with normal breathing effort HEART: regular rate & rhythm and no murmurs and no lower extremity edema ABDOMEN:abdomen soft, non-tender and normal bowel sounds Musculoskeletal:no cyanosis  of digits and no clubbing  NEURO: alert & oriented x 3 with fluent speech, no focal motor/sensory deficits  LABORATORY DATA:  I have reviewed the data as listed    Latest Ref Rng & Units 02/04/2023    5:36 AM 02/03/2023    5:12 AM 02/02/2023    5:27 AM  CBC  WBC 4.0 - 10.5 K/uL 9.0  9.6  7.6   Hemoglobin 12.0 - 15.0 g/dL 8.7  9.4  8.6   Hematocrit 36.0 - 46.0 % 28.0  30.2  27.4   Platelets 150 - 400 K/uL 201  231  196         Latest Ref Rng & Units 02/04/2023    5:36 AM 02/03/2023    5:12 AM 02/02/2023    5:27 AM  CMP  Glucose 70 - 99 mg/dL 186  155  166   BUN 8 - 23 mg/dL 19  20  26   $ Creatinine 0.44 - 1.00 mg/dL 0.89  0.93  0.87   Sodium 135 - 145 mmol/L 134  137  138   Potassium 3.5 - 5.1 mmol/L 3.9  4.5  4.1   Chloride 98 - 111 mmol/L 96  98  99   CO2 22 - 32 mmol/L 26  29  29   $ Calcium 8.9 - 10.3 mg/dL 8.3  8.9  8.8   Total Protein 6.5 - 8.1 g/dL 6.8  7.7  6.4   Total Bilirubin 0.3 - 1.2 mg/dL 1.1  1.3  1.0   Alkaline Phos 38 - 126 U/L 255  245  222   AST 15 - 41 U/L 85  95  89   ALT 0 - 44 U/L 65  66  59       RADIOGRAPHIC STUDIES: I have personally reviewed the radiological images as listed and agreed with the findings in the report. DG CHEST PORT 1 VIEW  Result Date: 02/04/2023 CLINICAL DATA:  SOB EXAM: PORTABLE CHEST 1 VIEW COMPARISON:  02/03/2023 FINDINGS: Diffuse interstitial and alveolar opacity with no pneumothorax or pleural effusion identified. Unremarkable cardiac silhouette. IMPRESSION: Diffuse interstitial and alveolar edema versus bilateral pneumonia. Electronically Signed   By: Sammie Bench M.D.   On: 02/04/2023 08:01   DG CHEST PORT 1 VIEW  Result Date:  02/03/2023 CLINICAL DATA:  sob shortness of breath EXAM: PORTABLE CHEST - 1 VIEW COMPARISON:  02/02/2023 FINDINGS: Scattered interstitial and airspace opacities throughout both lungs, involving bases more than apices, grossly stable compared to the previous radiograph. Heart size and mediastinal contours are within normal limits. Aortic Atherosclerosis (ICD10-170.0). No effusion. Visualized bones unremarkable.  Surgical clips right breast. IMPRESSION: Stable bilateral interstitial and airspace opacities. No acute findings. Electronically Signed   By: Lucrezia Europe M.D.   On: 02/03/2023 08:18      No orders of the defined types were placed in this encounter.  All questions were answered. The patient knows to call the clinic with any problems, questions or concerns. No barriers to learning was detected. The total time spent in the appointment was {CHL ONC TIME VISIT - [id number].     Baldemar Friday, CMA 02/04/2023   I, Audry Riles, CMA, am acting as scribe for Truitt Merle, MD.   {Add scribe attestation statement}

## 2023-02-04 NOTE — Progress Notes (Signed)
Patient stated son could not pick her up until tomorrow morning and patient declined tax voucher.

## 2023-02-04 NOTE — Progress Notes (Addendum)
PT Cancellation/Screen Note  Patient Details Name: Victoria [name] MRN: [id number] DOB: October 23, 1941   Cancelled Treatment:    Reason Eval/Treat Not Completed:  Order received. Briefly spoke with patient-she was set to d/c and waiting on transportation. Pt denies need for PT services. PT eval is not warranted. Pt was also previously screened on 2/5-no acute PT needs. Will sign off.    [facility]  Office: [phone]

## 2023-02-04 NOTE — Evaluation (Signed)
Occupational Therapy Evaluation Patient Details Name: Victoria [name] MRN: [id number] DOB: 1941/01/22 Today's Date: 02/04/2023   History of Present Illness Patient is a 82 year old female who presented on 01/25/23 with weakness and shortness of breath. Patient was admitted with multifocal pneumonia, acute respiratory failure with hypoxia, acute diastolic CHF exacerbation, volume overload, PMH: DM II, DVT/PE, hepatitis C liver cirrhosis, normocytic anemia, pancreatic cancer   Clinical Impression   Patient evaluated by Occupational Therapy with no further acute OT needs identified. All education has been completed and the patient has no further questions. Patient is MI in room for ADLs. Patient endorsed being at her baseline with patient previously working in the [employer] field. Patient reported family would be able to support her at home at current level.  See below for any follow-up Occupational Therapy or equipment needs. OT is signing off. Thank you for this referral.       Recommendations for follow up therapy are one component of a multi-disciplinary discharge planning process, led by the attending physician.  Recommendations may be updated based on patient status, additional functional criteria and insurance authorization.   Follow Up Recommendations  No OT follow up     Assistance Recommended at Discharge Intermittent Supervision/Assistance  Patient can return home with the following Assistance with cooking/housework;Direct supervision/assist for medications management;Assist for transportation;Help with stairs or ramp for entrance    Functional Status Assessment  Patient has not had a recent decline in their functional status  Equipment Recommendations  None recommended by OT       Precautions / Restrictions Precautions Precautions: Fall Restrictions Weight Bearing Restrictions: No      Mobility Bed Mobility Overal bed mobility: Modified Independent                      Balance Overall balance assessment: Mild deficits observed, not formally tested         ADL either performed or assessed with clinical judgement   ADL Overall ADL's : At baseline       General ADL Comments: patient is completing ADLs in room with independence. patient upon inital sit to stand from bed in room was noted to have posterior leaning with toes off the floor during attempt to transition into standing. patient was able to recover herself and transition to standing. patietn was educated on the importance of keeping toes on the ground during transitions. patient completed UB/LB dressing, toileting and functional mobility tasks in room with MI on this date after intial sit to stand with no LOB. patient reported she plans to have family home with her during first days home. patient was educated on ECT. patient verbalized understanding.     Vision Patient Visual Report: No change from baseline              Pertinent Vitals/Pain Pain Assessment Pain Assessment: No/denies pain     Hand Dominance Right   Extremity/Trunk Assessment Upper Extremity Assessment Upper Extremity Assessment: Overall WFL for tasks assessed   Lower Extremity Assessment Lower Extremity Assessment: Overall WFL for tasks assessed   Cervical / Trunk Assessment Cervical / Trunk Assessment: Normal   Communication Communication Communication: No difficulties   Cognition Arousal/Alertness: Awake/alert Behavior During Therapy: WFL for tasks assessed/performed Overall Cognitive Status: Within Functional Limits for tasks assessed                  Home Living Family/patient expects to be discharged to:: Private residence Living Arrangements: Alone Available Help  at Discharge: Family Type of Home: Other(Comment) (townhouse) Home Access: Level entry     Home Layout: One level     Bathroom Shower/Tub: Teacher, early years/pre: Standard     Home Equipment: Shower seat           Prior Functioning/Environment Prior Level of Function : Independent/Modified Independent;Driving                                 OT Goals(Current goals can be found in the care plan section) Acute Rehab OT Goals OT Goal Formulation: All assessment and education complete, DC therapy  OT Frequency:         AM-PAC OT "6 Clicks" Daily Activity     Outcome Measure Help from another person eating meals?: None Help from another person taking care of personal grooming?: None Help from another person toileting, which includes using toliet, bedpan, or urinal?: None Help from another person bathing (including washing, rinsing, drying)?: None Help from another person to put on and taking off regular upper body clothing?: None Help from another person to put on and taking off regular lower body clothing?: None 6 Click Score: 24   End of Session Nurse Communication: Other (comment) (ok to see patient)  Activity Tolerance: Patient tolerated treatment well Patient left: in bed;with call bell/phone within reach  OT Visit Diagnosis: Unsteadiness on feet (R26.81)                Time: [id number] OT Time Calculation (min): 14 min Charges:  OT General Charges $OT Visit: 1 Visit OT Evaluation $OT Eval Low Complexity: 1 Low  Winifred Balogh OTR/L, MS Acute Rehabilitation Department Office# [phone]   Willa Rough 02/04/2023, 2:03 PM

## 2023-02-04 NOTE — Discharge Summary (Signed)
Physician Discharge Summary   Patient: Victoria [name] MRN: [id number] DOB: [birth year]  Admit date:     01/24/2023  Discharge date: 02/04/2023  Discharge Physician: Raiford Noble, DO   PCP: Elby Showers, MD   Recommendations at discharge:   Follow-up with PCP within 1 to 2 weeks and repeat CBC, CMP, mag, Phos within 1 week Follow-up with medical oncology on 02/06/2023 Follow-up with palliative care medicine on 02/06/2023 Follow-up at [facility] for port placement on 02/07/2023 at 2:30 PM Follow-up with gastroenterology in outpatient setting for liver metastasis and continued elevated LFTs She will need outpatient cardiology follow-up given that she be discharged on p.o. Lasix will need to continue monitor volume status carefully  Discharge Diagnoses: Principal Problem:   Multifocal pneumonia Active Problems:   DM2 (diabetes mellitus, type 2) ([place])   Hypertension   Pancreatic cancer ([facility])   History of DVT (deep vein thrombosis)   History of pulmonary embolism   Normocytic anemia   Shortness of breath   Acute diastolic CHF (congestive heart failure) ([place])   Palliative care patient   Neoplasm related pain  Resolved Problems:   * No resolved hospital problems. [facility] Course: The patient is an 82 year old overweight African-American female with a past medical history significant for but not limited to history of PE and DVT on anticoagulation, diabetes mellitus type 2, pancreatic cancer, hypertension as well as other comorbidities who presented with weakness and shortness of breath. She reports that she had been doing well since her discharge from the hospital for community-acquired pneumonia however over the last 2 days she notes that she had a productive cough and weakness. She did not have any fever, nausea, vomiting, diarrhea or sick contacts. She took some over-the-counter medications prescribed for her cough however did did not help. Her weakness worsened so she decided come to  the ED for further evaluation. Her initial chest x-ray showed a patchy bibasilar airspace opacity with a multifocal pneumonia versus asymmetrical pulmonary edema noted and so she was given treatment with IV antibiotics and started on diuresis. She started improving with diuresis and her antibiotics were changed to oral medications.     She was planning to be discharged yesterday however she acutely worsen and became more dyspneic.  Pulm is consulted and recommended no changes to plan of care.  Today she continues to be dyspneic and desaturated on ambulatory home O2 screen.  Given her multiple medical comorbidities palliative has been consulted for further evaluation recommendations.    DG Chest X-Ray done and showed "No significant change in diffuse interstitial prominence and mild bibasilar airspace disease." Palliative care consulted and making adjustments for pain regimen and pain is slowly improving but still persistent and was very uncontrolled overnight.   Plan was for a Port-A-Cath placement and I have notified the IR team and unfortunately they cannot do it today and they do not know what they can do tomorrow.  After further discussion patient is medically stable for discharge and has significant anxiety about being discharged.  She wanted her appointment set up so I have ensured that she has proper follow-up and she has a Port-A-Cath scheduled to be placed on Friday 216 at 2:30 PM.  She will see Dr. Burr Medico the day before as well as palliative care team.  Patient continues to make symptoms and try not to get discharged however she remained stable and will need follow-up with her PCP as well as medical oncologist, palliative care medicine as well as  interventional radiology in the outpatient setting.  Assessment and Plan:  Dyspnea 2/2 Multifocal pneumonia with Acute Respiratory Failure with Hypoxia and likely concomitant Acute Diastolic CHF Exacerbation, improved and stable  -Patient presented with  generalized weakness, shortness of breath. -Patient noted to have recently hospitalized and treated for a atypical pneumonia with 5 days of IV Rocephin and IV azithromycin. -BNP within normal limits. -Procalcitonin negative. -MRSA PCR negative. -Blood cultures pending with no growth to date x 5 Days. -SpO2: 93 % O2 Flow Rate (L/min): 2 L/min (down to 1 lpm post neb) -Respiratory viral panel negative. -SARS coronavirus PCR negative, influenza A and B PCR negative, respiratory syncytial virus negative. -Discontinued IV Vancomycin and Continue IV Cefepime and oral Abx now stopped.  -2D echo with EF 60 to 65%, NWMA, grade 1 diastolic dysfunction.   -IV Lasix has not been stopped and she has been transitioned to oral Lasix -Given her continued dyspnea we have added Xopenex and Atrovent scheduled every 6 as well as Budesonide and Brovana as well as increasing the guaifenesin to 1200 mg p.o. twice daily and adding a flutter valve today incentive spirometry; now the budesonide has increased from 0.25 mg to 0.5 mg twice daily -Omnicef now discontinued -Strict I's and O's, daily weights. -Repeat CXR today done and showed "Stable bilateral interstitial and airspace opacities. No acute findings." -Continues to feel dyspenic and given her worsening chest x-ray we have consulted pulmonary for further evaluation; pulmonary evaluated and she is recently been treated for pneumonia as well as decompensated diastolic heart failure with her infectious workup being negative and blood cultures being negative pulmonary had no really further recommendations and recommended no changes to current management but we have made further changes as above -Ambulatory Home O2 screen done and she did not desaturate  -Palliative care consulted for further goals of care discussion and symptom management given that she continues to be dyspneic and they are recommending continuing current plan of care and have made medication  adjustments -Repeat CXR this AM done and showed "Diffuse interstitial and alveolar opacity with no pneumothorax or pleural effusion identified. Unremarkable cardiac silhouette." -Repeat CXR in 3-6 weeks    Acute Diastolic CHF Exacerbation/Volume Overload, improved -Patient had shortness of breath, initial concern was for multifocal pneumonia. -Patient placed on IV Lasix with good urine output and clinical improvement with diuresis. -2D echo  with EF 60 to 65%, NWMA, grade 1 diastolic dysfunction. -IV Lasix has been transitioned to oral Lasix 20 mg daily now and was given a dose of IV Lasix 40 mg x1 -Strict I's and O's and Daily Weights; Patient is -9 lbs since 01/24/23 and is +326.3 mLiters since Admission and I's an O's not done yesterday and today -CXR this AM done and showed "Stable bilateral interstitial and airspace opacities. No acute findings." -May benefit from outpatient follow-up with Cardiology.   Hypertension -Continue home regimen of Losartan but will discontinue HCTZ. -He was transitioned off of IV Lasix to p.o. Lasix 20 mg daily now (Given a dose of IV Lasix this AM prior to D/C) -Will purse discontinuation of HCTZ since the patient is being transitioned to oral Furosemide -Continue to Monitor BP per Protocol -C/w Hydralazine 10 mg IV q8hprn for HBP  -Last BP Reading was on the softer side at 122/85   History of DVT/PE -Continue Anticoagulation with Apixaban 5 mg po BID   Diabetes Mellitus Type 2 -Hemoglobin A1c 6.7 (12/06/2022) -Continue to hold oral hypoglycemic agents.   -Continue Moderate Novolog  SSI AC/HS. -CBG's ranging from 128-229   Abnormal LFTs in setting of history of hepatitis C liver cirrhosis -LFT Trend: Recent Labs  Lab 01/29/23 0919 01/30/23 0505 01/31/23 0957 02/01/23 0602 02/02/23 0527 02/03/23 0512 02/04/23 0536  AST 47* 81* 68* 83* 89* 95* 85*  ALT 33 47* 47* 57* 59* 66* 65*  BILITOT 1.0 1.1 1.2 1.1 1.0 1.3* 1.1  ALKPHOS 145* 188* 186* 207*  222* 245* 255*  -Check right upper quadrant ultrasound and showed "Status post cholecystectomy. Findings consistent with hepatic cirrhosis. No definite focal sonographic hepatic abnormality seen." -She follows up with [facility] gastro[facility] and has a known history of cirrhosis and will need to follow-up with them in the outpatient setting within 1 to 2 weeks   Normocytic Anemia -Anemia panel consistent with anemia of chronic disease and showed an iron level of 87, UIBC of 308, TIBC 395, saturation ratios of 22% -Patient was complains of some generalized weakness, denies any overt bleeding. -C/w Ferrous Sulfate tab 325 mg po Daily  -Due to current presentation with shortness of breath, concern for volume overload she was transfused 1 unit of PRBC -Hgb/Hct Trend: Recent Labs  Lab 01/29/23 0919 01/30/23 0505 01/31/23 0957 02/01/23 0602 02/02/23 0527 02/03/23 0512 02/04/23 0536  HGB 9.3* 9.9* 9.3* 8.9* 8.6* 9.4* 8.7*  HCT 29.2* 31.0* 30.1* 28.7* 27.4* 30.2* 28.0*  MCV 88.8 88.3 90.4 89.4 89.8 90.7 89.7  -Follow H&H. -Transfusion threshold hemoglobin < 8. -Continue to monitor for signs and symptoms of bleeding; no overt bleeding noted -Repeat CBC in a.m.   History of Pancreatic Cancer -Outpatient follow-up with Oncology. -Oncology Dr. Burr Medico notified of admission via epic and I discussed the case with Dr. Burr Medico and patient was supposed to have a Port-A-Cath placed however Dr. Burr Medico take care of this in the outpatient setting early next week. -Palliative Care consulted for further [place] discussion and remains full code with full scope of care with currently continuing current plan of care.  Palliative care to follow-up and support and will see the patient on 02/06/2022.  In terms of her symptom management they are starting MS Contin 15 mg every 12 hours and recommending continuing Oxy IR 5-10 mg every 4 hours as needed for breakthrough pain; patient also has IV morphine 4 mg every 6 as needed  severe pain -Pain was still uncontrolled and she has just gotten a few doses of MS IR but this is improving and pain is well-controlled on current regimen -Oncology has arranged for an outpatient Port-A-Cath however she remained hospitalized and unable to get it done while she is in the hospital.  I have spoken with the interventional radiology team and they have scheduled her as an outpatient on 2:30 PM on Friday, 02/07/2023.  Patient is also to see the palliative care team on 215 as well as her medical oncologist Dr. Burr Medico  Hypokalemia, improved  -Patient's K+ Level Trend: Recent Labs  Lab 01/29/23 0919 01/30/23 0505 01/31/23 0957 02/01/23 0602 02/02/23 0527 02/03/23 0512 02/04/23 0536  K 3.7 4.2 3.9 3.9 4.1 4.5 3.9  -Mag Level was 2.0 on the time of D/C -Continue to Monitor and Replete as Necessary -Repeat CMP in the AM    Hypoalbuminemia -Patient's Albumin Trend: Recent Labs  Lab 01/29/23 0919 01/30/23 0505 01/31/23 0957 02/01/23 0602 02/02/23 0527 02/03/23 0512 02/04/23 0536  ALBUMIN 2.9* 3.0* 2.8* 2.8* 2.7* 3.0* 2.7*  -Continue to Monitor and Trend and repeat CMP in the AM    Overweight  -Complicates  overall prognosis and care -Estimated body mass index is 28.66 kg/m as calculated from the following:   Height as of this encounter: 5' 8"$  (1.727 m).   Weight as of this encounter: 85.5 kg.  -Weight Loss and Dietary Counseling given  GERD/GI Prophylaxis -C/w Pantoprazole 40 mg po Daily    Consultants: Pulmonary, interventional radiology, medical oncology, palliative care medicine Procedures performed: As delineated as above Disposition: Home Diet recommendation:  Discharge Diet Orders (From admission, onward)     Start     Ordered   02/04/23 0000  Diet - low sodium heart healthy        02/04/23 1252   02/04/23 0000  Diet Carb Modified        02/04/23 1252           Cardiac diet DISCHARGE MEDICATION: Allergies as of 02/04/2023       Reactions    Lisinopril Cough        Medication List     STOP taking these medications    docusate sodium 100 MG capsule Commonly known as: COLACE   losartan-hydrochlorothiazide 100-25 MG tablet Commonly known as: HYZAAR   metFORMIN 500 MG tablet Commonly known as: GLUCOPHAGE       TAKE these medications    albuterol 108 (90 Base) MCG/ACT inhaler Commonly known as: VENTOLIN HFA Inhale 2 puffs into the lungs every 6 (six) hours as needed for wheezing or shortness of breath.   ALPRAZolam 0.5 MG tablet Commonly known as: XANAX TAKE 1 TABLET(0.5 MG) BY MOUTH TWICE DAILY AS NEEDED FOR ANXIETY What changed: See the new instructions.   apixaban 5 MG Tabs tablet Commonly known as: ELIQUIS Take 1 tablet (5 mg total) by mouth 2 (two) times daily.   B-complex with vitamin C tablet Take 1 tablet by mouth daily.   benzonatate 100 MG capsule Commonly known as: Tessalon Perles Take 1 capsule (100 mg total) by mouth 3 (three) times daily as needed for cough.   cetirizine 10 MG tablet Commonly known as: ZYRTEC Take 10 mg by mouth daily as needed (allergies).   chlorpheniramine-HYDROcodone 10-8 MG/5ML Commonly known as: TUSSIONEX Take 5 mLs by mouth every 12 (twelve) hours as needed for cough.   cholecalciferol 25 MCG (1000 UNIT) tablet Commonly known as: VITAMIN D3 Take 1,000 Units by mouth in the morning.   ferrous sulfate 325 (65 FE) MG tablet Take 1 tablet (325 mg total) by mouth daily.   freestyle lancets CHECK GLUCOSE TWICE A DAY   furosemide 20 MG tablet Commonly known as: Lasix Take 1 tablet (20 mg total) by mouth daily.   guaiFENesin 600 MG 12 hr tablet Commonly known as: Mucinex Take 1 tablet (600 mg total) by mouth 2 (two) times daily.   HYDROcodone-acetaminophen 5-325 MG tablet Commonly known as: NORCO/VICODIN Take 1 tablet by mouth every 6 (six) hours as needed. What changed: reasons to take this   loperamide 2 MG capsule Commonly known as: IMODIUM Take 2-4  mg by mouth 3 (three) times daily as needed for diarrhea or loose stools.   losartan 100 MG tablet Commonly known as: COZAAR Take 1 tablet (100 mg total) by mouth daily. Start taking on: February 05, 2023   morphine 15 MG 12 hr tablet Commonly known as: MS CONTIN Take 1 tablet (15 mg total) by mouth every 12 (twelve) hours.   multivitamin with minerals Tabs tablet Take 1 tablet by mouth in the morning.   ondansetron 4 MG tablet Commonly known as: ZOFRAN  Take 1 tablet (4 mg total) by mouth every 6 (six) hours as needed for nausea.   oxyCODONE 5 MG immediate release tablet Commonly known as: Oxy IR/ROXICODONE Take 1-2 tablets (5-10 mg total) by mouth every 4 (four) hours as needed for moderate pain, severe pain or breakthrough pain.   oxymetazoline 0.05 % nasal spray Commonly known as: AFRIN Place 2 sprays into both nostrils 2 (two) times daily as needed for congestion.   pantoprazole 40 MG tablet Commonly known as: Protonix Take 1 tablet (40 mg total) by mouth daily. What changed: when to take this   polyethylene glycol 17 g packet Commonly known as: MIRALAX / GLYCOLAX Take 17 g by mouth daily. Start taking on: February 05, 2023   senna-docusate 8.6-50 MG tablet Commonly known as: Senokot-S Take 1 tablet by mouth at bedtime.   True Metrix Blood Glucose Test test strip Generic drug: glucose blood TEST BLOOD SUGAR TWICE DAILY   True Metrix Meter w/Device Kit USE AS DIRECTED        Follow-up Information     Elby Showers, MD. Call.   Specialty: Internal Medicine Why: Follow up within 1-2 weeks Contact information: [address] [id number] [phone]         [facility]. Go on 02/07/2023.   Why: Appointment Scheduled for 2:30 pm on Friday 02/07/23 for Port-A-Cath Placement Contact information: [place] [phone] [place], Carlena Sax, NP Follow  up.   Specialties: Nurse Practitioner, Pediatric Ophthalmology Why: Follow up for 02/06/23 Contact information: [place] [id number]         Truitt Merle, MD. Go to.   Specialties: Hematology, Oncology Why: Appointment being scheduled for Friday Morning (02/07/23) Contact information: [place] [address] [phone]         Mansouraty, Telford Nab., MD Follow up.   Specialties: Gastroenterology, Internal Medicine Why: Follow up within 1-2 weeks Contact information: [place] [address] [phone]                Discharge Exam: Filed Weights   01/26/23 0500 01/27/23 0500 01/30/23 0500  Weight: 87.6 kg 84.8 kg 85.5 kg   Vitals:   02/04/23 0515 02/04/23 1409  BP: 125/78 122/85  Pulse: (!) 103 (!) 101  Resp: 18 18  Temp: 98 F (36.7 C) 98.1 F (36.7 C)  SpO2: 91% 93%   Examination: Physical Exam:  Constitutional: WN/WD overweight African-American female currently no acute distress appears anxious Respiratory: Diminished to auscultation bilaterally with coarse breath sounds and some very minimal crackles and some slight rhonchi., no wheezing, rales, rhonchi or . Normal respiratory effort and patient is not tachypenic. No accessory muscle use.  Unlabored breathing and not wearing supplemental oxygen nasal cannula Cardiovascular: RRR, no murmurs / rubs / gallops. S1 and S2 auscultated. No extremity edema.  Abdomen: Soft, non-tender, distended secondary to body habitus. Bowel sounds positive.  GU: Deferred. Musculoskeletal: No clubbing / cyanosis of digits/nails. No joint deformity upper and lower extremities.  Skin: No rashes, lesions, ulcers on limited skin evaluation. No induration; Warm and dry.  Neurologic: CN 2-12 grossly intact with no focal deficits. Romberg sign and cerebellar reflexes not assessed.  Psychiatric: Normal judgment and insight. Alert and oriented x 3.  Anxious  mood  Condition at discharge: stable  The results of significant diagnostics from this hospitalization (including  imaging, microbiology, ancillary and laboratory) are listed below for reference.   Imaging Studies: DG CHEST PORT 1 VIEW  Result Date: 02/04/2023 CLINICAL DATA:  SOB EXAM: PORTABLE CHEST 1 VIEW COMPARISON:  02/03/2023 FINDINGS: Diffuse interstitial and alveolar opacity with no pneumothorax or pleural effusion identified. Unremarkable cardiac silhouette. IMPRESSION: Diffuse interstitial and alveolar edema versus bilateral pneumonia. Electronically Signed   By: Sammie Bench M.D.   On: 02/04/2023 08:01   DG CHEST PORT 1 VIEW  Result Date: 02/03/2023 CLINICAL DATA:  sob shortness of breath EXAM: PORTABLE CHEST - 1 VIEW COMPARISON:  02/02/2023 FINDINGS: Scattered interstitial and airspace opacities throughout both lungs, involving bases more than apices, grossly stable compared to the previous radiograph. Heart size and mediastinal contours are within normal limits. Aortic Atherosclerosis (ICD10-170.0). No effusion. Visualized bones unremarkable.  Surgical clips right breast. IMPRESSION: Stable bilateral interstitial and airspace opacities. No acute findings. Electronically Signed   By: Lucrezia Europe M.D.   On: 02/03/2023 08:18   DG CHEST PORT 1 VIEW  Result Date: 02/02/2023 CLINICAL DATA:  Shortness of breath EXAM: PORTABLE CHEST 1 VIEW COMPARISON:  02/01/2023 and prior studies FINDINGS: The cardiomediastinal silhouette is unchanged. Bilateral interstitial/mild airspace opacities again noted, slightly increased on the RIGHT. There is no evidence of pneumothorax or large pleural effusion. No other changes are noted. IMPRESSION: Bilateral interstitial/mild airspace opacities, slightly increased on the RIGHT. Electronically Signed   By: Margarette Canada M.D.   On: 02/02/2023 08:25   DG CHEST PORT 1 VIEW  Result Date: 02/01/2023 CLINICAL DATA:  Shortness of breath.  Pneumonia. EXAM: PORTABLE  CHEST 1 VIEW COMPARISON:  01/31/2023 FINDINGS: Heart size is stable. Diffuse interstitial prominence noted. Mild airspace disease is seen in both lower lungs, without significant change. No evidence of pleural effusion. IMPRESSION: No significant change in diffuse interstitial prominence and mild bibasilar airspace disease. Electronically Signed   By: Marlaine Hind M.D.   On: 02/01/2023 09:03   DG CHEST PORT 1 VIEW  Result Date: 01/31/2023 CLINICAL DATA:  Shortness of breath, dyspnea EXAM: PORTABLE CHEST 1 VIEW COMPARISON:  01/30/2023 FINDINGS: Bilateral lower lobe airspace disease and mild heterogeneous upper lobe airspace disease concerning for multilobar pneumonia. Right lower lobe airspace disease has improved compared with 01/24/2023. No pleural effusion or pneumothorax. Heart and mediastinal contours are unremarkable. No acute osseous abnormality. Osteoarthritis of bilateral glenohumeral joints. IMPRESSION: 1. Bilateral lower lobe airspace disease and mild heterogeneous upper lobe airspace disease concerning for multilobar pneumonia. Electronically Signed   By: Kathreen Devoid M.D.   On: 01/31/2023 12:20   US Abdomen Limited RUQ (LIVER/GB)  Result Date: 01/30/2023 CLINICAL DATA:  Abnormal liver function tests. EXAM: ULTRASOUND ABDOMEN LIMITED RIGHT UPPER QUADRANT COMPARISON:  October 03, 2014.  November 14, 2014. FINDINGS: Gallbladder: Status post cholecystectomy. Common bile duct: Diameter: 4 mm which is within normal limits. Liver: No focal lesion identified. Nodular hepatic contours are noted suggesting cirrhosis. Heterogeneous echotexture of hepatic parenchyma is noted. Portal vein is patent on color Doppler imaging with normal direction of blood flow towards the liver. Other: None. IMPRESSION: Status post cholecystectomy. Findings consistent with hepatic cirrhosis. No definite focal sonographic hepatic abnormality seen. Electronically Signed   By: Marijo Conception M.D.   On: 01/30/2023 15:30   DG CHEST  PORT 1 VIEW  Result Date: 01/30/2023 CLINICAL DATA:  [id number] with shortness of breath. EXAM: PORTABLE CHEST 1 VIEW COMPARISON:  Portable chest yesterday at 9:29 a.m. FINDINGS: 4:55 a.m. There is interval worsening of patchy heterogeneous  airspace disease in the lower lung fields. Small pleural effusions appear similar. The upper lung fields are generally clear. There is mild cardiomegaly without findings of acute CHF. The aorta is tortuous with calcification in the transverse segment. Stable mediastinum. Slight thoracic dextroscoliosis.  No new osseous abnormality. IMPRESSION: Interval worsening of patchy heterogeneous airspace disease in the lower lung fields bilaterally. Small pleural effusions. No findings of acute CHF or other significant change. Electronically Signed   By: Telford Nab M.D.   On: 01/30/2023 07:18   DG CHEST PORT 1 VIEW  Result Date: 01/29/2023 CLINICAL DATA:  Short of breath EXAM: PORTABLE CHEST 1 VIEW COMPARISON:  Radiograph 01/24/2023, CT 01/13/2023 FINDINGS: Normal cardiac silhouette. Patchy bilateral airspace opacities appears slightly improved. No focal consolidation. No pneumothorax. No acute osseous abnormality. IMPRESSION: Slight improvement in patchy bilateral airspace opacities. Electronically Signed   By: Suzy Bouchard M.D.   On: 01/29/2023 09:59   ECHOCARDIOGRAM COMPLETE  Result Date: 01/26/2023    ECHOCARDIOGRAM REPORT   Patient Name:   NAVINA WANINGER Date of Exam: 01/26/2023 Medical Rec #:  [id number]         Height:       68.0 in Accession #:    [id number]        Weight:       193.2 lb Date of Birth:  1941-01-05         BSA:          2.014 m Patient Age:    85 years          BP:           144/87 mmHg Patient Gender: F                 HR:           88 bpm. Exam Location:  Inpatient Procedure: 2D Echo Indications:    CHF  History:        Patient has prior history of Echocardiogram examinations, most                 recent 11/22/2022. Risk Factors:Diabetes and  Hypertension.  Sonographer:    Harvie Junior Referring Phys: [id number] DANIEL V THOMPSON  Sonographer Comments: Image acquisition challenging due to respiratory motion. IMPRESSIONS  1. Left ventricular ejection fraction, by estimation, is 60 to 65%. The left ventricle has normal function. The left ventricle has no regional wall motion abnormalities. There is mild concentric left ventricular hypertrophy. Left ventricular diastolic parameters are consistent with Grade I diastolic dysfunction (impaired relaxation).  2. Right ventricular systolic function is normal. The right ventricular size is normal. There is normal pulmonary artery systolic pressure.  3. No evidence of mitral valve regurgitation.  4. The aortic valve is grossly normal. Aortic valve regurgitation is not visualized.  5. The inferior vena cava is normal in size with greater than 50% respiratory variability, suggesting right atrial pressure of 3 mmHg. Comparison(s): No significant change from prior study. FINDINGS  Left Ventricle: Left ventricular ejection fraction, by estimation, is 60 to 65%. The left ventricle has normal function. The left ventricle has no regional wall motion abnormalities. The left ventricular internal cavity size was normal in size. There is  mild concentric left ventricular hypertrophy. Left ventricular diastolic parameters are consistent with Grade I diastolic dysfunction (impaired relaxation). Right Ventricle: The right ventricular size is normal. Right ventricular systolic function is normal. There is normal pulmonary artery systolic pressure. The tricuspid regurgitant velocity is 2.71 m/s, and with  an assumed right atrial pressure of 3 mmHg,  the estimated right ventricular systolic pressure is [id number] mmHg. Left Atrium: Left atrial size was normal in size. Right Atrium: Right atrial size was normal in size. Pericardium: There is no evidence of pericardial effusion. Mitral Valve: No evidence of mitral valve regurgitation. Tricuspid  Valve: Tricuspid valve regurgitation is mild. Aortic Valve: The aortic valve is grossly normal. Aortic valve regurgitation is not visualized. Aortic valve mean gradient measures 3.0 mmHg. Aortic valve peak gradient measures 5.6 mmHg. Aortic valve area, by VTI measures 2.78 cm. Pulmonic Valve: Pulmonic valve regurgitation is not visualized. Aorta: The aortic root and ascending aorta are structurally normal, with no evidence of dilitation. Venous: The inferior vena cava is normal in size with greater than 50% respiratory variability, suggesting right atrial pressure of 3 mmHg. IAS/Shunts: No atrial level shunt detected by color flow Doppler.  LEFT VENTRICLE PLAX 2D LVIDd:         3.50 cm     Diastology LVIDs:         2.40 cm     LV e' medial:    5.33 cm/s LV PW:         1.10 cm     LV E/e' medial:  12.5 LV IVS:        1.30 cm     LV e' lateral:   7.29 cm/s LVOT diam:     2.00 cm     LV E/e' lateral: 9.2 LV SV:         61 LV SV Index:   30 LVOT Area:     3.14 cm                             3D Volume EF: LV Volumes (MOD)           3D EF:        55 % LV vol d, MOD A2C: 51.5 ml LV EDV:       119 ml LV vol d, MOD A4C: 71.4 ml LV ESV:       54 ml LV vol s, MOD A2C: 20.2 ml LV SV:        66 ml LV vol s, MOD A4C: 27.8 ml LV SV MOD A2C:     31.3 ml LV SV MOD A4C:     71.4 ml LV SV MOD BP:      37.2 ml RIGHT VENTRICLE RV S prime:     17.00 cm/s TAPSE (M-mode): 1.9 cm LEFT ATRIUM           Index LA diam:      3.20 cm 1.59 cm/m LA Vol (A4C): 40.9 ml 20.31 ml/m  AORTIC VALVE                    PULMONIC VALVE AV Area (Vmax):    2.60 cm     PV Vmax:       0.81 m/s AV Area (Vmean):   2.56 cm     PV Peak grad:  2.7 mmHg AV Area (VTI):     2.78 cm AV Vmax:           118.00 cm/s AV Vmean:          81.400 cm/s AV VTI:            0.220 m AV Peak Grad:      5.6 mmHg AV Mean Grad:  3.0 mmHg LVOT Vmax:         97.70 cm/s LVOT Vmean:        66.400 cm/s LVOT VTI:          0.195 m LVOT/AV VTI ratio: 0.89  AORTA Ao Root diam: 3.40 cm  Ao Asc diam:  3.50 cm MITRAL VALVE               TRICUSPID VALVE MV Area (PHT): 3.99 cm    TR Peak grad:   29.4 mmHg MV Decel Time: 190 msec    TR Vmax:        271.00 cm/s MV E velocity: 66.80 cm/s MV A velocity: 95.50 cm/s  SHUNTS MV E/A ratio:  0.70        Systemic VTI:  0.20 m                            Systemic Diam: 2.00 cm Phineas Inches Electronically signed by Phineas Inches Signature Date/Time: 01/26/2023/1:19:22 PM    Final    DG Chest Portable 1 View  Result Date: 01/24/2023 CLINICAL DATA:  Shortness of breath, recent pneumonia and PE. EXAM: PORTABLE CHEST 1 VIEW COMPARISON:  Chest x-ray dated 12/06/2022. FINDINGS: Diffuse bilateral interstitial prominence, presumably edema, and patchy bibasilar airspace opacities. No pleural effusion or pneumothorax is seen. Heart size and mediastinal contours are stable. IMPRESSION: 1. Patchy bibasilar airspace opacities, multifocal pneumonia versus asymmetric pulmonary edema. 2. Bilateral interstitial prominence, also compatible with atypical pneumonia versus edema. Electronically Signed   By: Franki Cabot M.D.   On: 01/24/2023 10:40   VAS [place] UPPER EXTREMITY VENOUS DUPLEX  Result Date: 01/20/2023 UPPER VENOUS STUDY  Patient Name:  CONCEPCION MCGURRIN  Date of Exam:   01/20/2023 Medical Rec #: [id number]          Accession #:    [id number] Date of Birth: [birth year]          Patient Gender: F Patient Age:   25 years Exam Location:  [facility] Procedure:      VAS [place] UPPER EXTREMITY VENOUS DUPLEX Referring Phys: PRANAV PATEL --------------------------------------------------------------------------------  Indications: Edema Risk Factors: None identified. Limitations: Poor ultrasound/tissue interface. Comparison Study: No prior studies. Performing Technologist: Oliver Hum RVT  Examination Guidelines: A complete evaluation includes B-mode imaging, spectral Doppler, color Doppler, and power Doppler as needed of all accessible portions of each vessel. Bilateral  testing is considered an integral part of a complete examination. Limited examinations for reoccurring indications may be performed as noted.  Right Findings: +----------+------------+---------+-----------+----------+-------+ RIGHT     CompressiblePhasicitySpontaneousPropertiesSummary +----------+------------+---------+-----------+----------+-------+ Subclavian    Full       Yes       Yes                      +----------+------------+---------+-----------+----------+-------+  Left Findings: +----------+------------+---------+-----------+----------+-------+ LEFT      CompressiblePhasicitySpontaneousPropertiesSummary +----------+------------+---------+-----------+----------+-------+ IJV           Full       Yes       Yes                      +----------+------------+---------+-----------+----------+-------+ Subclavian    None       No        No                Acute  +----------+------------+---------+-----------+----------+-------+ Axillary      None  No        No                Acute  +----------+------------+---------+-----------+----------+-------+ Brachial      Full       No        No                       +----------+------------+---------+-----------+----------+-------+ Radial        Full                                          +----------+------------+---------+-----------+----------+-------+ Ulnar         Full                                          +----------+------------+---------+-----------+----------+-------+ Cephalic      Full                                          +----------+------------+---------+-----------+----------+-------+ Basilic       None                                   Acute  +----------+------------+---------+-----------+----------+-------+  Summary:  Right: No evidence of thrombosis in the subclavian.  Left: Findings consistent with acute deep vein thrombosis involving the left subclavian vein and left  axillary vein. Findings consistent with acute superficial vein thrombosis involving the left basilic vein.  *See table(s) above for measurements and observations.  Diagnosing physician: Monica Martinez MD Electronically signed by Monica Martinez MD on 01/20/2023 at 5:43:57 PM.    Final    CT Angio Chest PE W and/or Wo Contrast  Result Date: 01/14/2023 CLINICAL DATA:  Abdominal pain. Also concern for pulmonary embolism. EXAM: CT ANGIOGRAPHY CHEST CT ABDOMEN AND PELVIS WITH CONTRAST TECHNIQUE: Multidetector CT imaging of the chest was performed using the standard protocol during bolus administration of intravenous contrast. Multiplanar CT image reconstructions and MIPs were obtained to evaluate the vascular anatomy. Multidetector CT imaging of the abdomen and pelvis was performed using the standard protocol during bolus administration of intravenous contrast. RADIATION DOSE REDUCTION: This exam was performed according to the departmental dose-optimization program which includes automated exposure control, adjustment of the mA and/or kV according to patient size and/or use of iterative reconstruction technique. CONTRAST:  162m OMNIPAQUE IOHEXOL 350 MG/ML SOLN COMPARISON:  CT abdomen pelvis dated 12/27/2022 and chest CT dated 11/21/2022. FINDINGS: Evaluation is limited due to respiratory motion and streak artifact caused by the patient's arms. CTA CHEST FINDINGS Cardiovascular: There is no cardiomegaly or pericardial effusion. Mild atherosclerotic calcification of the thoracic aorta. No aneurysmal dilatation or dissection. Evaluation of the pulmonary arteries is limited due to respiratory motion and suboptimal visualization of the peripheral branches. No obvious large central pulmonary artery embolus identified. Mediastinum/Nodes: Top-normal bilateral hilar lymph nodes measure 1 cm short axis. The esophagus is grossly unremarkable. No mediastinal fluid collection. Lungs/Pleura: Scattered clusters of ground-glass  nodular densities throughout the lungs suspicious for atypical infection. Metastatic disease is not excluded clinical correlation is recommended. There is no pleural effusion or pneumothorax. The central airways  are patent. Musculoskeletal: Degenerative changes of the spine and osteopenia. No acute osseous pathology. Review of the MIP images confirms the above findings. CT ABDOMEN and PELVIS FINDINGS No intra-abdominal free air or free fluid. Hepatobiliary: Cirrhosis. Mild biliary dilatation versus periportal edema. Cholecystectomy. Pancreas: Ill-defined soft tissue mass in the region of the head of the pancreas measuring approximately 4.9 x 3.5 cm (22/4). There is atrophy of the body of the pancreas and dilatation of the main pancreatic duct. No active inflammatory changes. Spleen: Normal in size without focal abnormality. Adrenals/Urinary Tract: The adrenal glands unremarkable. Small bilateral renal cysts. There is no hydronephrosis on either side. There is symmetric enhancement and excretion of contrast by both kidneys. The visualized ureters and urinary bladder appear unremarkable. Stomach/Bowel: Severe colonic diverticulosis without active inflammatory changes. There is no bowel obstruction active inflammation. The appendix is normal. Vascular/Lymphatic: Moderate aortoiliac atherosclerotic disease. The IVC is unremarkable. No portal venous gas. Retroperitoneal adenopathy measures 13 mm to the left of the aorta, 17 mm in the periportal region, and 19 mm in the gastrohepatic vision. Reproductive: Hysterectomy.  No adnexal masses. Other: Small fat containing umbilical and supraumbilical hernia. Musculoskeletal: Degenerative changes of the spine. No acute osseous pathology. Review of the MIP images confirms the above findings. IMPRESSION: 1. No CT evidence of central pulmonary artery embolus. 2. Scattered clusters of ground-glass nodular densities throughout the lungs may represent atypical infection or metastatic  disease. 3. Ill-defined soft tissue mass in the region of the head of the pancreas with atrophy of the body of the pancreas and dilatation of the main pancreatic duct. Findings are concerning for pancreatic malignancy. Further evaluation with MRI with and without contrast recommended. 4. Retroperitoneal adenopathy. 5. Cirrhosis. 6. Severe colonic diverticulosis without active inflammatory changes. No bowel obstruction. Normal appendix. 7.  Aortic Atherosclerosis (ICD10-I70.0). Electronically Signed   By: Anner Crete M.D.   On: 01/14/2023 00:04   CT ABDOMEN PELVIS W CONTRAST  Result Date: 01/14/2023 CLINICAL DATA:  Abdominal pain. Also concern for pulmonary embolism. EXAM: CT ANGIOGRAPHY CHEST CT ABDOMEN AND PELVIS WITH CONTRAST TECHNIQUE: Multidetector CT imaging of the chest was performed using the standard protocol during bolus administration of intravenous contrast. Multiplanar CT image reconstructions and MIPs were obtained to evaluate the vascular anatomy. Multidetector CT imaging of the abdomen and pelvis was performed using the standard protocol during bolus administration of intravenous contrast. RADIATION DOSE REDUCTION: This exam was performed according to the departmental dose-optimization program which includes automated exposure control, adjustment of the mA and/or kV according to patient size and/or use of iterative reconstruction technique. CONTRAST:  172m OMNIPAQUE IOHEXOL 350 MG/ML SOLN COMPARISON:  CT abdomen pelvis dated 12/27/2022 and chest CT dated 11/21/2022. FINDINGS: Evaluation is limited due to respiratory motion and streak artifact caused by the patient's arms. CTA CHEST FINDINGS Cardiovascular: There is no cardiomegaly or pericardial effusion. Mild atherosclerotic calcification of the thoracic aorta. No aneurysmal dilatation or dissection. Evaluation of the pulmonary arteries is limited due to respiratory motion and suboptimal visualization of the peripheral branches. No obvious  large central pulmonary artery embolus identified. Mediastinum/Nodes: Top-normal bilateral hilar lymph nodes measure 1 cm short axis. The esophagus is grossly unremarkable. No mediastinal fluid collection. Lungs/Pleura: Scattered clusters of ground-glass nodular densities throughout the lungs suspicious for atypical infection. Metastatic disease is not excluded clinical correlation is recommended. There is no pleural effusion or pneumothorax. The central airways are patent. Musculoskeletal: Degenerative changes of the spine and osteopenia. No acute osseous pathology. Review of the MIP  images confirms the above findings. CT ABDOMEN and PELVIS FINDINGS No intra-abdominal free air or free fluid. Hepatobiliary: Cirrhosis. Mild biliary dilatation versus periportal edema. Cholecystectomy. Pancreas: Ill-defined soft tissue mass in the region of the head of the pancreas measuring approximately 4.9 x 3.5 cm (22/4). There is atrophy of the body of the pancreas and dilatation of the main pancreatic duct. No active inflammatory changes. Spleen: Normal in size without focal abnormality. Adrenals/Urinary Tract: The adrenal glands unremarkable. Small bilateral renal cysts. There is no hydronephrosis on either side. There is symmetric enhancement and excretion of contrast by both kidneys. The visualized ureters and urinary bladder appear unremarkable. Stomach/Bowel: Severe colonic diverticulosis without active inflammatory changes. There is no bowel obstruction active inflammation. The appendix is normal. Vascular/Lymphatic: Moderate aortoiliac atherosclerotic disease. The IVC is unremarkable. No portal venous gas. Retroperitoneal adenopathy measures 13 mm to the left of the aorta, 17 mm in the periportal region, and 19 mm in the gastrohepatic vision. Reproductive: Hysterectomy.  No adnexal masses. Other: Small fat containing umbilical and supraumbilical hernia. Musculoskeletal: Degenerative changes of the spine. No acute osseous  pathology. Review of the MIP images confirms the above findings. IMPRESSION: 1. No CT evidence of central pulmonary artery embolus. 2. Scattered clusters of ground-glass nodular densities throughout the lungs may represent atypical infection or metastatic disease. 3. Ill-defined soft tissue mass in the region of the head of the pancreas with atrophy of the body of the pancreas and dilatation of the main pancreatic duct. Findings are concerning for pancreatic malignancy. Further evaluation with MRI with and without contrast recommended. 4. Retroperitoneal adenopathy. 5. Cirrhosis. 6. Severe colonic diverticulosis without active inflammatory changes. No bowel obstruction. Normal appendix. 7.  Aortic Atherosclerosis (ICD10-I70.0). Electronically Signed   By: Anner Crete M.D.   On: 01/14/2023 00:04    Microbiology: Results for orders placed or performed during the hospital encounter of 01/24/23  Resp panel by RT-PCR (RSV, Flu A&B, Covid) Anterior Nasal Swab     Status: None   Collection Time: 01/24/23 10:15 AM   Specimen: Anterior Nasal Swab  Result Value Ref Range Status   SARS Coronavirus 2 by RT PCR NEGATIVE NEGATIVE Final    Comment: (NOTE) SARS-CoV-2 target nucleic acids are NOT DETECTED.  The SARS-CoV-2 RNA is generally detectable in upper respiratory specimens during the acute phase of infection. The lowest concentration of SARS-CoV-2 viral copies this assay can detect is 138 copies/mL. A negative result does not preclude SARS-Cov-2 infection and should not be used as the sole basis for treatment or other patient management decisions. A negative result may occur with  improper specimen collection/handling, submission of specimen other than nasopharyngeal swab, presence of viral mutation(s) within the areas targeted by this assay, and inadequate number of viral copies(<138 copies/mL). A negative result must be combined with clinical observations, patient history, and  epidemiological information. The expected result is Negative.  Fact Sheet for Patients:  EntrepreneurPulse.com.au  Fact Sheet for Healthcare Providers:  IncredibleEmployment.be  This test is no t yet approved or cleared by the [place] FDA and  has been authorized for detection and/or diagnosis of SARS-CoV-2 by FDA under an Emergency Use Authorization (EUA). This EUA will remain  in effect (meaning this test can be used) for the duration of the COVID-19 declaration under Section 564(b)(1) of the Act, 21 U.S.C.section 360bbb-3(b)(1), unless the authorization is terminated  or revoked sooner.       Influenza A by PCR NEGATIVE NEGATIVE Final   Influenza B by PCR  NEGATIVE NEGATIVE Final    Comment: (NOTE) The Xpert Xpress SARS-CoV-2/FLU/RSV plus assay is intended as an aid in the diagnosis of influenza from Nasopharyngeal swab specimens and should not be used as a sole basis for treatment. Nasal washings and aspirates are unacceptable for Xpert Xpress SARS-CoV-2/FLU/RSV testing.  Fact Sheet for Patients: EntrepreneurPulse.com.au  Fact Sheet for Healthcare Providers: IncredibleEmployment.be  This test is not yet approved or cleared by the [place] FDA and has been authorized for detection and/or diagnosis of SARS-CoV-2 by FDA under an Emergency Use Authorization (EUA). This EUA will remain in effect (meaning this test can be used) for the duration of the COVID-19 declaration under Section 564(b)(1) of the Act, 21 U.S.C. section 360bbb-3(b)(1), unless the authorization is terminated or revoked.     Resp Syncytial Virus by PCR NEGATIVE NEGATIVE Final    Comment: (NOTE) Fact Sheet for Patients: EntrepreneurPulse.com.au  Fact Sheet for Healthcare Providers: IncredibleEmployment.be  This test is not yet approved or cleared by the [place] FDA and has been  authorized for detection and/or diagnosis of SARS-CoV-2 by FDA under an Emergency Use Authorization (EUA). This EUA will remain in effect (meaning this test can be used) for the duration of the COVID-19 declaration under Section 564(b)(1) of the Act, 21 U.S.C. section 360bbb-3(b)(1), unless the authorization is terminated or revoked.  Performed at [facility], [place] [address], [place], [place] [address]   Blood culture (routine x 2)     Status: None   Collection Time: 01/24/23 11:17 AM   Specimen: BLOOD LEFT ARM  Result Value Ref Range Status   Specimen Description   Final    BLOOD LEFT ARM BOTTLES DRAWN AEROBIC AND ANAEROBIC Performed at [facility] [address] [place], [place] [address]    Special Requests   Final    Blood Culture adequate volume Performed at [place] [address], [place], [place] [address]    Culture   Final    NO GROWTH 5 DAYS Performed at [facility], [place] [address] [place], [place] [address]    Report Status 01/29/2023 FINAL  Final  Blood culture (routine x 2)     Status: None   Collection Time: 01/24/23 11:25 AM   Specimen: BLOOD LEFT HAND  Result Value Ref Range Status   Specimen Description BLOOD LEFT HAND  Final   Special Requests   Final    BOTTLES DRAWN AEROBIC AND ANAEROBIC Blood Culture adequate volume   Culture   Final    NO GROWTH 5 DAYS Performed at [facility], [place] [address], [place],  [address]    Report Status 01/29/2023 FINAL  Final  Respiratory (~20 pathogens) panel by PCR     Status: None   Collection Time: 01/24/23 12:33 PM   Specimen: Nasopharyngeal Swab; Respiratory  Result Value Ref Range Status   Adenovirus NOT DETECTED NOT DETECTED Final   Coronavirus 229E NOT DETECTED NOT DETECTED Final    Comment: (NOTE) The Coronavirus on the Respiratory Panel, DOES NOT test for the novel  Coronavirus (2019 nCoV)    Coronavirus HKU1 NOT  DETECTED NOT DETECTED Final   Coronavirus NL63 NOT DETECTED NOT DETECTED Final   Coronavirus OC43 NOT DETECTED NOT DETECTED Final   Metapneumovirus NOT DETECTED NOT DETECTED Final   Rhinovirus / Enterovirus NOT DETECTED NOT DETECTED Final   Influenza A NOT DETECTED NOT DETECTED Final   Influenza B NOT DETECTED NOT DETECTED Final  Parainfluenza Virus 1 NOT DETECTED NOT DETECTED Final   Parainfluenza Virus 2 NOT DETECTED NOT DETECTED Final   Parainfluenza Virus 3 NOT DETECTED NOT DETECTED Final   Parainfluenza Virus 4 NOT DETECTED NOT DETECTED Final   Respiratory Syncytial Virus NOT DETECTED NOT DETECTED Final   Bordetella pertussis NOT DETECTED NOT DETECTED Final   Bordetella Parapertussis NOT DETECTED NOT DETECTED Final   Chlamydophila pneumoniae NOT DETECTED NOT DETECTED Final   Mycoplasma pneumoniae NOT DETECTED NOT DETECTED Final    Comment: Performed at [facility], [place] [address] [place], [place] [address]  MRSA Next Gen by PCR, Nasal     Status: None   Collection Time: 01/25/23  1:15 PM   Specimen: Nasal Mucosa; Nasal Swab  Result Value Ref Range Status   MRSA by PCR Next Gen NOT DETECTED NOT DETECTED Final    Comment: (NOTE) The GeneXpert MRSA Assay (FDA approved for NASAL specimens only), is one component of a comprehensive MRSA colonization surveillance program. It is not intended to diagnose MRSA infection nor to guide or monitor treatment for MRSA infections. Test performance is not FDA approved in patients less than 54 years old. Performed at [facility], [place] [address] [place], [place] [address]    Labs: CBC: Recent Labs  Lab 01/31/23 0957 02/01/23 0602 02/02/23 0527 02/03/23 0512 02/04/23 0536  WBC 8.8 8.2 7.6 9.6 9.0  NEUTROABS 6.8 6.2 5.8 6.0 6.8  HGB 9.3* 8.9* 8.6* 9.4* 8.7*  HCT 30.1* 28.7* 27.4* 30.2* 28.0*  MCV 90.4 89.4 89.8 90.7 89.7  PLT 204 214 196 231 [id number]   Basic Metabolic Panel: Recent Labs  Lab 01/31/23 0957  02/01/23 0602 02/02/23 0527 02/03/23 0512 02/04/23 0536  NA 135 135 138 137 134*  K 3.9 3.9 4.1 4.5 3.9  CL 96* 95* 99 98 96*  CO2 29 29 29 29 26  $ GLUCOSE 245* 194* 166* 155* 186*  BUN 30* 30* 26* 20 19  CREATININE 1.06* 1.02* 0.87 0.93 0.89  CALCIUM 8.9 8.7* 8.8* 8.9 8.3*  MG 2.0 2.0 2.0 2.3 2.0  PHOS 4.0 4.6 3.9 4.1 3.6   Liver Function Tests: Recent Labs  Lab 01/31/23 0957 02/01/23 0602 02/02/23 0527 02/03/23 0512 02/04/23 0536  AST 68* 83* 89* 95* 85*  ALT 47* 57* 59* 66* 65*  ALKPHOS 186* 207* 222* 245* 255*  BILITOT 1.2 1.1 1.0 1.3* 1.1  PROT 6.7 7.0 6.4* 7.7 6.8  ALBUMIN 2.8* 2.8* 2.7* 3.0* 2.7*   CBG: Recent Labs  Lab 02/03/23 1133 02/03/23 1625 02/03/23 2149 02/04/23 0814 02/04/23 1113  GLUCAP 181* 128* 203* 165* 229*   Discharge time spent: greater than 30 minutes.  Signed: Raiford Noble, DO [facility] 02/04/2023

## 2023-02-05 ENCOUNTER — Other Ambulatory Visit: Payer: Self-pay

## 2023-02-05 ENCOUNTER — Inpatient Hospital Stay: Payer: Medicare HMO

## 2023-02-05 ENCOUNTER — Inpatient Hospital Stay: Payer: Medicare HMO | Admitting: Hematology

## 2023-02-05 NOTE — Progress Notes (Deleted)
Patient Care Team: Elby Showers, MD as PCP - General (Internal Medicine) Inda Castle, MD (Inactive) as Consulting Physician (Gastroenterology) Truitt Merle, MD as Consulting Physician (Oncology)   CHIEF COMPLAINT: Newly diagnosed metastatic pancreatic cancer and recent hospital admission; first outpatient follow-up  Oncology History  Pancreatic cancer [facility])  01/14/2023 Initial Diagnosis   Pancreatic cancer ([place])   01/27/2023 -  Chemotherapy   Patient is on Treatment Plan : PANCREAS Gemcitabine D1,8,15 (1000) q28d x 4 Cycles        CURRENT THERAPY: Pending single agent gemcitabine  INTERVAL HISTORY Victoria [name] presents for first outpatient follow-up, initially seen by Dr. Burr Medico in the hospital on 01/14/23 and again on readmission for multifocal pneumonia and CHF exacerbation on 02/03/2023.   ROS   Past Medical History:  Diagnosis Date   Abnormal finding on Pap smear    Allergy    SEASONAL   Anxiety    Breast cancer ([place]) 2000   right breast lumptectomy, radiation done   Cirrhosis ([place]) 11/2012   resolved per pt   Diabetes mellitus type 2, controlled ([place])    Hepatitis C    took tx for    Hypertension    Obesity    Thrombocytopenia ([place]) 11/2012     Past Surgical History:  Procedure Laterality Date   ANTERIOR AND POSTERIOR REPAIR N/A 02/28/2021   Procedure: ANTERIOR (CYSTOCELE) AND POSTERIOR REPAIR (RECTOCELE);  Surgeon: Cheri Fowler, MD;  Location: [facility];  Service: Gynecology;  Laterality: N/A;   BIOPSY  01/09/2023   Procedure: BIOPSY;  Surgeon: Irving Copas., MD;  Location: [facility];  Service: Gastroenterology;;   BREAST BIOPSY Left 09/09/2016   BREAST LUMPECTOMY  right   BREAST LUMPECTOMY     BREAST REDUCTION SURGERY  2005   CATARACT EXTRACTION, BILATERAL  2014   CHOLECYSTECTOMY N/A 03/30/2013   Procedure: LAPAROSCOPIC CHOLECYSTECTOMY WITH INTRAOPERATIVE CHOLANGIOGRAM;  Surgeon: Shann Medal, MD;  Location: [facility];   Service: General;  Laterality: N/A;   COLONOSCOPY  2021   CYSTOSCOPY N/A 02/28/2021   Procedure: Consuela Mimes;  Surgeon: Cheri Fowler, MD;  Location: [place];  Service: Gynecology;  Laterality: N/A;   CYSTOSCOPY W/ RETROGRADES Left 02/28/2021   Procedure: CYSTOSCOPY WITH RETROGRADE PYELOGRAM;  Surgeon: Cheri Fowler, MD;  Location: [place];  Service: Gynecology;  Laterality: Left;   ESOPHAGOGASTRODUODENOSCOPY (EGD) WITH PROPOFOL N/A 11/25/2022   Procedure: ESOPHAGOGASTRODUODENOSCOPY (EGD) WITH PROPOFOL;  Surgeon: Carol Ada, MD;  Location: [place];  Service: Gastroenterology;  Laterality: N/A;   ESOPHAGOGASTRODUODENOSCOPY (EGD) WITH PROPOFOL N/A 01/09/2023   Procedure: ESOPHAGOGASTRODUODENOSCOPY (EGD) WITH PROPOFOL;  Surgeon: Rush Landmark Telford Nab., MD;  [facility];  Service: Gastroenterology;  Laterality: N/A;   EUS  01/28/2013   Procedure: UPPER ENDOSCOPIC ULTRASOUND (EUS) LINEAR;  Surgeon: Milus Banister, MD;  [facility];  Service: Endoscopy;  Laterality: N/A;  lenee woehrle [phone]   EUS Left 11/25/2022   Procedure: UPPER ENDOSCOPIC ULTRASOUND (EUS) LINEAR;  Surgeon: Carol Ada, MD;  Location: [place];  Service: Gastroenterology;  Laterality: Left;   EUS N/A 01/09/2023   Procedure: UPPER ENDOSCOPIC ULTRASOUND (EUS) LINEAR;  Surgeon: Irving Copas., MD;  [facility];  Service: Gastroenterology;  Laterality: N/A;   EXCISION OF SKIN TAG Right 03/30/2013   Procedure: EXCISION OF SKIN TAG;  Surgeon: Shann Medal, MD;  [facility];  Service: General;  Laterality: Right;   FINE NEEDLE ASPIRATION  11/25/2022   Procedure:  FINE NEEDLE ASPIRATION (FNA) LINEAR;  Surgeon: Carol Ada, MD;  Location: [place];  Service: Gastroenterology;;   FINE NEEDLE ASPIRATION N/A 01/09/2023   Procedure: FINE NEEDLE ASPIRATION (FNA) LINEAR;  Surgeon: Irving Copas., MD;  [facility];  Service: Gastroenterology;  Laterality: N/A;   LIVER BIOPSY  03/30/2013   Procedure: LIVER BIOPSY;  Surgeon: Shann Medal, MD;  [facility];  Service: General;;   POLYPECTOMY     REDUCTION MAMMAPLASTY     THYROIDECTOMY, PARTIAL  1972   left   VAGINAL HYSTERECTOMY N/A 02/28/2021   Procedure: HYSTERECTOMY VAGINAL WITH UNILATERAL SALPINGECTOMY;  Surgeon: Cheri Fowler, MD;  Location: [place];  Service: Gynecology;  Laterality: N/A;     Outpatient Encounter Medications as of 02/06/2023  Medication Sig   albuterol (VENTOLIN HFA) 108 (90 Base) MCG/ACT inhaler Inhale 2 puffs into the lungs every 6 (six) hours as needed for wheezing or shortness of breath.   ALPRAZolam (XANAX) 0.5 MG tablet TAKE 1 TABLET(0.5 MG) BY MOUTH TWICE DAILY AS NEEDED FOR ANXIETY (Patient taking differently: Take 0.5 mg by mouth See admin instructions. Take 0.5 mg by mouth in the morning and evening)   apixaban (ELIQUIS) 5 MG TABS tablet Take 1 tablet (5 mg total) by mouth 2 (two) times daily.   B Complex-C (B-COMPLEX WITH VITAMIN C) tablet Take 1 tablet by mouth daily.   benzonatate (TESSALON PERLES) 100 MG capsule Take 1 capsule (100 mg total) by mouth 3 (three) times daily as needed for cough.   Blood Glucose Monitoring Suppl (TRUE METRIX METER) w/Device KIT USE AS DIRECTED   cetirizine (ZYRTEC) 10 MG tablet Take 10 mg by mouth daily as needed (allergies).   chlorpheniramine-HYDROcodone (TUSSIONEX) 10-8 MG/5ML Take 5 mLs by mouth every 12 (twelve) hours as needed for cough.   cholecalciferol (VITAMIN D3) 25 MCG (1000 UNIT) tablet Take 1,000 Units by mouth in the morning.   ferrous sulfate 325 (65 FE) MG tablet Take 1 tablet (325 mg total) by mouth daily.   furosemide (LASIX) 20 MG tablet Take 1 tablet (20 mg total) by mouth daily.   glucose blood (TRUE METRIX BLOOD GLUCOSE TEST) test strip TEST BLOOD SUGAR TWICE DAILY   guaiFENesin (MUCINEX) 600 MG 12 hr tablet Take 1 tablet (600 mg  total) by mouth 2 (two) times daily. (Patient not taking: Reported on 01/24/2023)   HYDROcodone-acetaminophen (NORCO/VICODIN) 5-325 MG tablet Take 1 tablet by mouth every 6 (six) hours as needed. (Patient taking differently: Take 1 tablet by mouth every 6 (six) hours as needed for moderate pain or severe pain.)   Lancets (FREESTYLE) lancets CHECK GLUCOSE TWICE A DAY   loperamide (IMODIUM) 2 MG capsule Take 2-4 mg by mouth 3 (three) times daily as needed for diarrhea or loose stools.   losartan (COZAAR) 100 MG tablet Take 1 tablet (100 mg total) by mouth daily.   morphine (MS CONTIN) 15 MG 12 hr tablet Take 1 tablet (15 mg total) by mouth every 12 (twelve) hours.   Multiple Vitamin (MULTIVITAMIN WITH MINERALS) TABS tablet Take 1 tablet by mouth in the morning.   ondansetron (ZOFRAN) 4 MG tablet Take 1 tablet (4 mg total) by mouth every 6 (six) hours as needed for nausea.   oxyCODONE (OXY IR/ROXICODONE) 5 MG immediate release tablet Take 1-2 tablets (5-10 mg total) by mouth every 4 (four) hours as needed for moderate pain, severe pain or breakthrough pain.   oxymetazoline (AFRIN) 0.05 % nasal spray Place 2  sprays into both nostrils 2 (two) times daily as needed for congestion.   pantoprazole (PROTONIX) 40 MG tablet Take 1 tablet (40 mg total) by mouth daily. (Patient taking differently: Take 40 mg by mouth daily before breakfast.)   polyethylene glycol (MIRALAX / GLYCOLAX) 17 g packet Take 17 g by mouth daily.   senna-docusate (SENOKOT-S) 8.6-50 MG tablet Take 1 tablet by mouth at bedtime.   No facility-administered encounter medications on file as of 02/06/2023.     There were no vitals filed for this visit. There is no height or weight on file to calculate BMI.   PHYSICAL EXAM GENERAL:alert, no distress and comfortable SKIN: no rash  EYES: sclera clear NECK: without mass LYMPH:  no palpable cervical or supraclavicular lymphadenopathy  LUNGS: clear with normal breathing effort HEART: regular  rate & rhythm, no lower extremity edema ABDOMEN: abdomen soft, non-tender and normal bowel sounds NEURO: alert & oriented x 3 with fluent speech, no focal motor/sensory deficits Breast exam:  PAC without erythema    CBC    Component Value Date/Time   WBC 9.0 02/04/2023 0536   RBC 3.12 (L) 02/04/2023 0536   HGB 8.7 (L) 02/04/2023 0536   HCT 28.0 (L) 02/04/2023 0536   PLT 201 02/04/2023 0536   MCV 89.7 02/04/2023 0536   MCH 27.9 02/04/2023 0536   MCHC 31.1 02/04/2023 0536   RDW 17.5 (H) 02/04/2023 0536   LYMPHSABS 1.0 02/04/2023 0536   MONOABS 0.9 02/04/2023 0536   EOSABS 0.2 02/04/2023 0536   BASOSABS 0.0 02/04/2023 0536     CMP     Component Value Date/Time   NA 134 (L) 02/04/2023 0536   K 3.9 02/04/2023 0536   CL 96 (L) 02/04/2023 0536   CO2 26 02/04/2023 0536   GLUCOSE 186 (H) 02/04/2023 0536   BUN 19 02/04/2023 0536   CREATININE 0.89 02/04/2023 0536   CREATININE 1.03 (H) 05/31/2022 0955   CALCIUM 8.3 (L) 02/04/2023 0536   PROT 6.8 02/04/2023 0536   ALBUMIN 2.7 (L) 02/04/2023 0536   AST 85 (H) 02/04/2023 0536   ALT 65 (H) 02/04/2023 0536   ALKPHOS 255 (H) 02/04/2023 0536   BILITOT 1.1 02/04/2023 0536   GFRNONAA >60 02/04/2023 0536   GFRNONAA 57 (L) 11/28/2020 1000   GFRAA 66 11/28/2020 1000     ASSESSMENT & PLAN:  PLAN:  No orders of the defined types were placed in this encounter.     All questions were answered. The patient knows to call the clinic with any problems, questions or concerns. No barriers to learning were detected. I spent *** counseling the patient face to face. The total time spent in the appointment was *** and more than 50% was on counseling, review of test results, and coordination of care.   Cira Rue, NP-C @DATE$ @

## 2023-02-06 ENCOUNTER — Other Ambulatory Visit (HOSPITAL_COMMUNITY): Payer: Self-pay | Admitting: Student

## 2023-02-06 ENCOUNTER — Inpatient Hospital Stay: Payer: Medicare HMO | Admitting: Nurse Practitioner

## 2023-02-06 ENCOUNTER — Inpatient Hospital Stay (HOSPITAL_COMMUNITY)
Admission: EM | Admit: 2023-02-06 | Discharge: 2023-02-12 | DRG: 166 | Disposition: A | Discharge status: Home / Self Care | Payer: Medicare HMO | Attending: Internal Medicine | Admitting: Internal Medicine

## 2023-02-06 ENCOUNTER — Other Ambulatory Visit: Payer: Self-pay

## 2023-02-06 ENCOUNTER — Emergency Department (HOSPITAL_COMMUNITY): Payer: Medicare HMO

## 2023-02-06 ENCOUNTER — Encounter (HOSPITAL_COMMUNITY): Payer: Self-pay | Admitting: Radiology

## 2023-02-06 ENCOUNTER — Inpatient Hospital Stay: Payer: Medicare HMO | Admitting: Genetic Counselor

## 2023-02-06 ENCOUNTER — Inpatient Hospital Stay: Payer: Medicare HMO

## 2023-02-06 DIAGNOSIS — C7800 Secondary malignant neoplasm of unspecified lung: Secondary | ICD-10-CM | POA: Diagnosis present

## 2023-02-06 DIAGNOSIS — Z9049 Acquired absence of other specified parts of digestive tract: Secondary | ICD-10-CM

## 2023-02-06 DIAGNOSIS — F419 Anxiety disorder, unspecified: Secondary | ICD-10-CM | POA: Diagnosis present

## 2023-02-06 DIAGNOSIS — Z515 Encounter for palliative care: Secondary | ICD-10-CM | POA: Diagnosis not present

## 2023-02-06 DIAGNOSIS — Z86711 Personal history of pulmonary embolism: Secondary | ICD-10-CM

## 2023-02-06 DIAGNOSIS — Z86718 Personal history of other venous thrombosis and embolism: Secondary | ICD-10-CM | POA: Diagnosis not present

## 2023-02-06 DIAGNOSIS — J189 Pneumonia, unspecified organism: Secondary | ICD-10-CM | POA: Diagnosis not present

## 2023-02-06 DIAGNOSIS — G893 Neoplasm related pain (acute) (chronic): Secondary | ICD-10-CM | POA: Diagnosis present

## 2023-02-06 DIAGNOSIS — R0602 Shortness of breath: Secondary | ICD-10-CM | POA: Diagnosis not present

## 2023-02-06 DIAGNOSIS — N179 Acute kidney failure, unspecified: Secondary | ICD-10-CM | POA: Diagnosis present

## 2023-02-06 DIAGNOSIS — B182 Chronic viral hepatitis C: Secondary | ICD-10-CM | POA: Diagnosis not present

## 2023-02-06 DIAGNOSIS — Y95 Nosocomial condition: Secondary | ICD-10-CM | POA: Diagnosis present

## 2023-02-06 DIAGNOSIS — D649 Anemia, unspecified: Secondary | ICD-10-CM | POA: Diagnosis not present

## 2023-02-06 DIAGNOSIS — Z833 Family history of diabetes mellitus: Secondary | ICD-10-CM

## 2023-02-06 DIAGNOSIS — Z7189 Other specified counseling: Secondary | ICD-10-CM

## 2023-02-06 DIAGNOSIS — Z7901 Long term (current) use of anticoagulants: Secondary | ICD-10-CM

## 2023-02-06 DIAGNOSIS — C3490 Malignant neoplasm of unspecified part of unspecified bronchus or lung: Secondary | ICD-10-CM | POA: Diagnosis not present

## 2023-02-06 DIAGNOSIS — I110 Hypertensive heart disease with heart failure: Secondary | ICD-10-CM | POA: Diagnosis present

## 2023-02-06 DIAGNOSIS — E669 Obesity, unspecified: Secondary | ICD-10-CM | POA: Diagnosis present

## 2023-02-06 DIAGNOSIS — K7460 Unspecified cirrhosis of liver: Secondary | ICD-10-CM | POA: Diagnosis present

## 2023-02-06 DIAGNOSIS — C772 Secondary and unspecified malignant neoplasm of intra-abdominal lymph nodes: Secondary | ICD-10-CM | POA: Diagnosis present

## 2023-02-06 DIAGNOSIS — C259 Malignant neoplasm of pancreas, unspecified: Secondary | ICD-10-CM | POA: Diagnosis present

## 2023-02-06 DIAGNOSIS — D638 Anemia in other chronic diseases classified elsewhere: Secondary | ICD-10-CM | POA: Diagnosis present

## 2023-02-06 DIAGNOSIS — J302 Other seasonal allergic rhinitis: Secondary | ICD-10-CM | POA: Diagnosis present

## 2023-02-06 DIAGNOSIS — Z79899 Other long term (current) drug therapy: Secondary | ICD-10-CM | POA: Diagnosis not present

## 2023-02-06 DIAGNOSIS — Z79891 Long term (current) use of opiate analgesic: Secondary | ICD-10-CM

## 2023-02-06 DIAGNOSIS — Z794 Long term (current) use of insulin: Secondary | ICD-10-CM | POA: Diagnosis not present

## 2023-02-06 DIAGNOSIS — J168 Pneumonia due to other specified infectious organisms: Secondary | ICD-10-CM | POA: Diagnosis not present

## 2023-02-06 DIAGNOSIS — I5033 Acute on chronic diastolic (congestive) heart failure: Secondary | ICD-10-CM | POA: Diagnosis not present

## 2023-02-06 DIAGNOSIS — C799 Secondary malignant neoplasm of unspecified site: Secondary | ICD-10-CM | POA: Diagnosis not present

## 2023-02-06 DIAGNOSIS — Z6828 Body mass index (BMI) 28.0-28.9, adult: Secondary | ICD-10-CM

## 2023-02-06 DIAGNOSIS — K5900 Constipation, unspecified: Secondary | ICD-10-CM | POA: Diagnosis not present

## 2023-02-06 DIAGNOSIS — R110 Nausea: Secondary | ICD-10-CM | POA: Diagnosis not present

## 2023-02-06 DIAGNOSIS — E119 Type 2 diabetes mellitus without complications: Secondary | ICD-10-CM | POA: Diagnosis present

## 2023-02-06 DIAGNOSIS — I10 Essential (primary) hypertension: Secondary | ICD-10-CM | POA: Diagnosis present

## 2023-02-06 DIAGNOSIS — R0600 Dyspnea, unspecified: Secondary | ICD-10-CM | POA: Diagnosis not present

## 2023-02-06 DIAGNOSIS — Z923 Personal history of irradiation: Secondary | ICD-10-CM

## 2023-02-06 DIAGNOSIS — J9601 Acute respiratory failure with hypoxia: Secondary | ICD-10-CM | POA: Diagnosis present

## 2023-02-06 DIAGNOSIS — C250 Malignant neoplasm of head of pancreas: Secondary | ICD-10-CM

## 2023-02-06 DIAGNOSIS — Z853 Personal history of malignant neoplasm of breast: Secondary | ICD-10-CM

## 2023-02-06 DIAGNOSIS — Z602 Problems related to living alone: Secondary | ICD-10-CM | POA: Diagnosis present

## 2023-02-06 DIAGNOSIS — Z452 Encounter for adjustment and management of vascular access device: Secondary | ICD-10-CM | POA: Diagnosis not present

## 2023-02-06 DIAGNOSIS — Z888 Allergy status to other drugs, medicaments and biological substances status: Secondary | ICD-10-CM

## 2023-02-06 DIAGNOSIS — D72829 Elevated white blood cell count, unspecified: Secondary | ICD-10-CM | POA: Diagnosis not present

## 2023-02-06 DIAGNOSIS — R000 Tachycardia, unspecified: Secondary | ICD-10-CM | POA: Diagnosis not present

## 2023-02-06 LAB — CBC WITH DIFFERENTIAL/PLATELET
Abs Immature Granulocytes: 0.07 10*3/uL (ref 0.00–0.07)
Basophils Absolute: 0 10*3/uL (ref 0.0–0.1)
Basophils Relative: 0 %
Eosinophils Absolute: 0.1 10*3/uL (ref 0.0–0.5)
Eosinophils Relative: 0 %
HCT: 28.5 % — ABNORMAL LOW (ref 36.0–46.0)
Hemoglobin: 8.6 g/dL — ABNORMAL LOW (ref 12.0–15.0)
Immature Granulocytes: 1 %
Lymphocytes Relative: 5 %
Lymphs Abs: 0.7 10*3/uL (ref 0.7–4.0)
MCH: 27.7 pg (ref 26.0–34.0)
MCHC: 30.2 g/dL (ref 30.0–36.0)
MCV: 91.6 fL (ref 80.0–100.0)
Monocytes Absolute: 0.9 10*3/uL (ref 0.1–1.0)
Monocytes Relative: 7 %
Neutro Abs: 11 10*3/uL — ABNORMAL HIGH (ref 1.7–7.7)
Neutrophils Relative %: 87 %
Platelets: 253 10*3/uL (ref 150–400)
RBC: 3.11 MIL/uL — ABNORMAL LOW (ref 3.87–5.11)
RDW: 17.6 % — ABNORMAL HIGH (ref 11.5–15.5)
WBC: 12.7 10*3/uL — ABNORMAL HIGH (ref 4.0–10.5)
nRBC: 0.2 % (ref 0.0–0.2)

## 2023-02-06 LAB — COMPREHENSIVE METABOLIC PANEL
ALT: 61 U/L — ABNORMAL HIGH (ref 0–44)
AST: 90 U/L — ABNORMAL HIGH (ref 15–41)
Albumin: 2.6 g/dL — ABNORMAL LOW (ref 3.5–5.0)
Alkaline Phosphatase: 243 U/L — ABNORMAL HIGH (ref 38–126)
Anion gap: 15 (ref 5–15)
BUN: 24 mg/dL — ABNORMAL HIGH (ref 8–23)
CO2: 25 mmol/L (ref 22–32)
Calcium: 8.4 mg/dL — ABNORMAL LOW (ref 8.9–10.3)
Chloride: 97 mmol/L — ABNORMAL LOW (ref 98–111)
Creatinine, Ser: 1.38 mg/dL — ABNORMAL HIGH (ref 0.44–1.00)
GFR, Estimated: 39 mL/min — ABNORMAL LOW (ref >60)
Glucose, Bld: 235 mg/dL — ABNORMAL HIGH (ref 70–99)
Potassium: 3.9 mmol/L (ref 3.5–5.1)
Sodium: 137 mmol/L (ref 135–145)
Total Bilirubin: 1.3 mg/dL — ABNORMAL HIGH (ref 0.3–1.2)
Total Protein: 7 g/dL (ref 6.5–8.1)

## 2023-02-06 LAB — BLOOD GAS, VENOUS
Acid-Base Excess: 4.7 mmol/L — ABNORMAL HIGH (ref 0.0–2.0)
Bicarbonate: 31.6 mmol/L — ABNORMAL HIGH (ref 20.0–28.0)
O2 Saturation: 23.2 %
Patient temperature: 37
pCO2, Ven: 56 mmHg (ref 44–60)
pH, Ven: 7.36 (ref 7.25–7.43)
pO2, Ven: <31 mmHg — CL (ref 32–45)

## 2023-02-06 LAB — PROTIME-INR
INR: 2.4 — ABNORMAL HIGH (ref 0.8–1.2)
Prothrombin Time: 25.5 seconds — ABNORMAL HIGH (ref 11.4–15.2)

## 2023-02-06 LAB — GLUCOSE, CAPILLARY: Glucose-Capillary: 160 mg/dL — ABNORMAL HIGH (ref 70–99)

## 2023-02-06 LAB — BRAIN NATRIURETIC PEPTIDE: B Natriuretic Peptide: 74.4 pg/mL (ref 0.0–100.0)

## 2023-02-06 MED ORDER — VANCOMYCIN HCL IN DEXTROSE 1-5 GM/200ML-% IV SOLN
1000.0000 mg | INTRAVENOUS | Status: DC
  Filled 2023-02-06: qty 200

## 2023-02-06 MED ORDER — BENZONATATE 100 MG PO CAPS
100.0000 mg | ORAL_CAPSULE | Freq: Three times a day (TID) | ORAL | Status: DC | PRN
  Administered 2023-02-06 – 2023-02-11 (×11): 100 mg via ORAL
  Filled 2023-02-06 (×11): qty 1

## 2023-02-06 MED ORDER — HYDROMORPHONE HCL 1 MG/ML IJ SOLN
0.5000 mg | Freq: Once | INTRAMUSCULAR | Status: AC
  Administered 2023-02-06: 0.5 mg via INTRAVENOUS
  Filled 2023-02-06: qty 1

## 2023-02-06 MED ORDER — ACETAMINOPHEN 650 MG RE SUPP: 650.0000 mg | Freq: Four times a day (QID) | RECTAL | Status: DC | PRN

## 2023-02-06 MED ORDER — FERROUS SULFATE 325 (65 FE) MG PO TABS
325.0000 mg | ORAL_TABLET | Freq: Every day | ORAL | Status: DC
  Administered 2023-02-08 – 2023-02-12 (×5): 325 mg via ORAL
  Filled 2023-02-06 (×5): qty 1

## 2023-02-06 MED ORDER — MORPHINE SULFATE ER 15 MG PO TBCR
15.0000 mg | EXTENDED_RELEASE_TABLET | Freq: Two times a day (BID) | ORAL | Status: DC
  Administered 2023-02-06 – 2023-02-10 (×8): 15 mg via ORAL
  Filled 2023-02-06 (×8): qty 1

## 2023-02-06 MED ORDER — OXYCODONE HCL 5 MG PO TABS
5.0000 mg | ORAL_TABLET | ORAL | Status: DC | PRN
  Administered 2023-02-06 – 2023-02-08 (×8): 5 mg via ORAL
  Administered 2023-02-09 – 2023-02-10 (×3): 10 mg via ORAL
  Administered 2023-02-10: 5 mg via ORAL
  Administered 2023-02-10 – 2023-02-11 (×4): 10 mg via ORAL
  Filled 2023-02-06 (×3): qty 1
  Filled 2023-02-06 (×2): qty 2
  Filled 2023-02-06 (×4): qty 1
  Filled 2023-02-06 (×5): qty 2
  Filled 2023-02-06: qty 1
  Filled 2023-02-06 (×2): qty 2
  Filled 2023-02-06: qty 1

## 2023-02-06 MED ORDER — SODIUM CHLORIDE 0.9 % IV SOLN
2.0000 g | Freq: Two times a day (BID) | INTRAVENOUS | Status: DC
  Administered 2023-02-06: 2 mg via INTRAVENOUS
  Administered 2023-02-07 – 2023-02-08 (×3): 2 g via INTRAVENOUS
  Filled 2023-02-06 (×5): qty 12.5

## 2023-02-06 MED ORDER — LORATADINE 10 MG PO TABS
10.0000 mg | ORAL_TABLET | Freq: Every day | ORAL | Status: DC
  Administered 2023-02-07 – 2023-02-12 (×6): 10 mg via ORAL
  Filled 2023-02-06 (×6): qty 1

## 2023-02-06 MED ORDER — SODIUM CHLORIDE 0.9 % IV SOLN
INTRAVENOUS | Status: DC
  Administered 2023-02-06: 50 mL/h via INTRAVENOUS

## 2023-02-06 MED ORDER — POLYETHYLENE GLYCOL 3350 17 G PO PACK
17.0000 g | PACK | Freq: Every day | ORAL | Status: DC
  Administered 2023-02-07 – 2023-02-12 (×6): 17 g via ORAL
  Filled 2023-02-06 (×4): qty 1

## 2023-02-06 MED ORDER — TRAZODONE HCL 50 MG PO TABS
25.0000 mg | ORAL_TABLET | Freq: Every evening | ORAL | Status: DC | PRN
  Administered 2023-02-08 – 2023-02-11 (×3): 25 mg via ORAL
  Filled 2023-02-06 (×5): qty 1

## 2023-02-06 MED ORDER — SENNOSIDES-DOCUSATE SODIUM 8.6-50 MG PO TABS
1.0000 | ORAL_TABLET | Freq: Every day | ORAL | Status: DC
  Administered 2023-02-06 – 2023-02-11 (×6): 1 via ORAL
  Filled 2023-02-06 (×6): qty 1

## 2023-02-06 MED ORDER — INSULIN ASPART 100 UNIT/ML IJ SOLN: 0.0000 [IU] | Freq: Every day | INTRAMUSCULAR | Status: DC

## 2023-02-06 MED ORDER — INSULIN ASPART 100 UNIT/ML IJ SOLN
0.0000 [IU] | Freq: Three times a day (TID) | INTRAMUSCULAR | Status: DC
  Administered 2023-02-07: 3 [IU] via SUBCUTANEOUS
  Administered 2023-02-07 – 2023-02-08 (×3): 2 [IU] via SUBCUTANEOUS
  Administered 2023-02-08 – 2023-02-09 (×2): 3 [IU] via SUBCUTANEOUS
  Administered 2023-02-10: 5 [IU] via SUBCUTANEOUS
  Administered 2023-02-10 (×2): 2 [IU] via SUBCUTANEOUS
  Administered 2023-02-11: 3 [IU] via SUBCUTANEOUS
  Administered 2023-02-11 – 2023-02-12 (×3): 2 [IU] via SUBCUTANEOUS
  Administered 2023-02-12: 3 [IU] via SUBCUTANEOUS

## 2023-02-06 MED ORDER — PANTOPRAZOLE SODIUM 40 MG PO TBEC
40.0000 mg | DELAYED_RELEASE_TABLET | Freq: Every day | ORAL | Status: DC
  Administered 2023-02-07 – 2023-02-12 (×6): 40 mg via ORAL
  Filled 2023-02-06 (×7): qty 1

## 2023-02-06 MED ORDER — HYDROCOD POLI-CHLORPHE POLI ER 10-8 MG/5ML PO SUER
5.0000 mL | Freq: Two times a day (BID) | ORAL | Status: DC | PRN
  Administered 2023-02-06 – 2023-02-11 (×5): 5 mL via ORAL
  Filled 2023-02-06 (×6): qty 5

## 2023-02-06 MED ORDER — ACETAMINOPHEN 325 MG PO TABS
650.0000 mg | ORAL_TABLET | Freq: Four times a day (QID) | ORAL | Status: DC | PRN
  Administered 2023-02-06 – 2023-02-11 (×5): 650 mg via ORAL
  Filled 2023-02-06 (×5): qty 2

## 2023-02-06 MED ORDER — ALPRAZOLAM 0.5 MG PO TABS
0.5000 mg | ORAL_TABLET | Freq: Two times a day (BID) | ORAL | Status: DC | PRN
  Administered 2023-02-06 – 2023-02-10 (×6): 0.5 mg via ORAL
  Filled 2023-02-06 (×6): qty 1

## 2023-02-06 MED ORDER — ONDANSETRON HCL 4 MG PO TABS: 4.0000 mg | ORAL_TABLET | Freq: Four times a day (QID) | ORAL | Status: DC | PRN

## 2023-02-06 MED ORDER — IOHEXOL 350 MG/ML SOLN
100.0000 mL | Freq: Once | INTRAVENOUS | Status: AC | PRN
  Administered 2023-02-06: 75 mL via INTRAVENOUS

## 2023-02-06 MED ORDER — LOPERAMIDE HCL 2 MG PO CAPS: 2.0000 mg | ORAL_CAPSULE | Freq: Three times a day (TID) | ORAL | Status: DC | PRN

## 2023-02-06 MED ORDER — PIPERACILLIN-TAZOBACTAM 3.375 G IVPB 30 MIN
3.3750 g | Freq: Once | INTRAVENOUS | Status: AC
  Administered 2023-02-06: 3.375 g via INTRAVENOUS
  Filled 2023-02-06: qty 50

## 2023-02-06 MED ORDER — ONDANSETRON HCL 4 MG/2ML IJ SOLN
4.0000 mg | Freq: Four times a day (QID) | INTRAMUSCULAR | Status: DC | PRN
  Administered 2023-02-09 – 2023-02-11 (×3): 4 mg via INTRAVENOUS
  Filled 2023-02-06 (×3): qty 2

## 2023-02-06 MED ORDER — APIXABAN 5 MG PO TABS
5.0000 mg | ORAL_TABLET | Freq: Two times a day (BID) | ORAL | Status: DC
  Administered 2023-02-06 – 2023-02-12 (×11): 5 mg via ORAL
  Filled 2023-02-06 (×12): qty 1

## 2023-02-06 MED ORDER — SODIUM CHLORIDE (PF) 0.9 % IJ SOLN
INTRAMUSCULAR | Status: AC
  Filled 2023-02-06: qty 50

## 2023-02-06 MED ORDER — VANCOMYCIN HCL 1500 MG/300ML IV SOLN
1500.0000 mg | Freq: Once | INTRAVENOUS | Status: AC
  Administered 2023-02-06: 1500 mg via INTRAVENOUS
  Filled 2023-02-06: qty 300

## 2023-02-06 MED ORDER — OXYMETAZOLINE HCL 0.05 % NA SOLN: 2.0000 | Freq: Two times a day (BID) | NASAL | Status: AC | PRN

## 2023-02-06 NOTE — Consult Note (Addendum)
Palliative Care Consult Note                                  Date: 02/06/2023   Patient Name: Victoria [name]  DOB: September 20, 1941  MRN: [id number]  Age / Sex: 82 y.o., female  PCP: Victoria Showers, MD Referring Physician: Tegeler, Gwenyth [name], *  Reason for Consultation: Disposition, Establishing goals of care, Non pain symptom management, and Pain control  HPI/Patient Profile: Palliative Care consult requested for goals of care discussion in this 81 y.o. female  with past medical history of hepatitis C, right breast cancer s/p lumpectomy, type 2 diabetes, hypertension, thrombocytopenia, PE/DVT, and recent diagnosis of pancreatic cancer (12/2022) followed by Victoria [name] with plans to initiate single agent treatment. Patient was seen during previous admission, discharged on 02/04/2023, now patient is currently admitted on 02/06/2023 with new hypoxia.   Past Medical History:  Diagnosis Date   Abnormal finding on Pap smear    Allergy    SEASONAL   Anxiety    Breast cancer (Victoria [name]) 2000   right breast lumptectomy, radiation done   Cirrhosis ([place]) 11/2012   resolved per pt   Diabetes mellitus type 2, controlled ([place])    Hepatitis C    took tx for    Hypertension    Obesity    Thrombocytopenia ([place]) 11/2012     Subjective:   This Victoria [name] Victoria [name] reviewed medical records, received report from team, assessed the patient and then met at the patient's bedside with Victoria [name] to discuss diagnosis, prognosis, GOC, EOL wishes disposition and options. No family present.   Patient is sitting upright in bed. No acute distress noted. Does complain of abdominal discomfort and is awaiting pain medication. States she feels that her breathing is much better since oxygen has been applied. Alert and able to engage appropriately in discussions.    Patient is familiar to myself and the palliative team. Was seen during recent admission and scheduled for follow-up with  myself today at Victoria [name] however was unable to keep appointment due to hypoxia requiring further ED evaluation. Values and goals of care important to patient and family were attempted to be elicited.  I created space and opportunity for patient to explore state of health prior to admission, thoughts, and feelings.   Victoria [name] reports she was somewhat short of breath at home on yesterday however seemed to improve throughout the day. Reports feelings of dyspnea more so with exertion. Cough with clear mucous. Denies recent fever. Endorses fatigue.   We discussed Her current illness and what it means in the larger context of Her on-going co-morbidities. Natural disease trajectory and expectations were discussed.  Victoria [name] verbalized understanding of current illness and co-morbidities.   She is asking if she can have oxygen at home once discharged. Advised if criteria is met this will be arranged. I had open and honest discussion with patient regarding her poor overall functional status with concerns for ability to pursue oncological interventions out of concern for tolerance. I also discussed with her Victoria [name] team (Victoria [name]). Victoria [name] understanding. She is emotional expressing her realistic understanding but also hoping she can at least attempt treatments to allow her an opportunity to live. She expresses her anxiety regarding the fear of unknown and feelings of being overwhelmed with diagnosis etc.   I asked if she and her family discussed goals of care further  reviewing previous discussions had with son, Victoria [name] during last admission. Victoria [name] shares her son is having a difficult time accepting the extent of her cancer. She knows that at some point she will continue to decline and pass away but is guarding her children. Emotional support provided. I offered to facilitate discussions with family to support her and answer questions they may have. She verbalized appreciation  however would like to continue discussions amongst themselves to allow their acceptance but would openly accept in the near future.   We discussed Victoria [name]'s ongoing dyspnea and abdominal pain. She confirmed that she picked up her pain medication and have been taking since discharge. Pain is well controlled on regimen. Does require breakthrough medication every 4-5 hours. Again expresses improvement since starting Victoria [name]. She understands we will continue to closely monitor and adjust as needed.   I discussed the importance of continued conversation with family and their medical providers regarding overall plan of care and treatment options, ensuring decisions are within the context of the patients values and GOCs.  Questions and concerns were addressed.Patient  was encouraged to call with questions or concerns.  PMT will continue to support holistically as needed.   Objective:   Primary Diagnoses: Present on Admission: **None**   Scheduled Meds:  sodium chloride (PF)        Continuous Infusions:  piperacillin-tazobactam      PRN Meds: sodium chloride (PF)  Allergies  Allergen Reactions   Lisinopril Cough    Review of Systems  Constitutional:  Positive for activity change and fatigue.  Respiratory:  Positive for cough and shortness of breath.   Gastrointestinal:  Positive for abdominal pain.  Unless otherwise noted, a complete review of systems is negative.  Physical Exam General: NAD Cardiovascular: regular rate and rhythm Pulmonary: diminished bilaterally, congested cough, 2L nasal cannula  Abdomen: soft, tender, obese, + bowel sounds Extremities: no edema, no joint deformities Skin: no rashes, warm and dry Neurological: AAO x3, mood appropriate   Vital Signs:  BP 99/69 (BP Location: Left Arm)   Pulse 92   Temp 97.7 F (36.5 C) (Oral)   Resp 15   Ht '5\' 8"'$  (1.727 m)   Wt 85.5 kg   SpO2 99%   BMI 28.66 kg/m  Pain Scale: 0-10   Pain Score: 5   SpO2:  SpO2: 99 % O2 Device:SpO2: 99 % O2 Flow Rate: .O2 Flow Rate (L/min): 2 L/min  IO: Intake/output summary: No intake or output data in the 24 hours ending 02/06/23 1619  LBM:   Baseline Weight: Weight: 85.5 kg Most recent weight: Weight: 85.5 kg      Palliative Assessment/Data:    Advanced Care Planning:   Primary Decision Maker: PATIENT, patient identified Quinton, her son, as her next decision maker  Code Status/Advance Care Planning: Full code  A discussion was had today regarding advanced directives. Concepts specific to code status, artifical feeding and hydration, continued IV antibiotics and rehospitalization was had.  The difference between a aggressive medical intervention path and a palliative comfort care path was discussed.   Victoria. [place]air is clear in expressed wishes to continue to treat the treatable allowing her every opportunity to thrive. She understands her cancer and trajectory. Is remaining hopeful that she can get to a point to at least attempt chemotherapy as she is not ready to "give up".   I empathetically approached discussions regarding code status. She expresses her awareness of importance of making decisions regarding DNR/DNI. She does not wish to  suffer or her life be prolonged with no meaningful recovery. Victoria. Escobar expresses wishes to remain a full code at this time again expressing her concern with her son's difficulty accepting her condition. States plans to discuss code status further with him as it is important to her to know he is at peace. I expressed understanding also emphasizing that patient also has to do what is best for her and honor her own wishes knowing that although no family wishes to lose a loved one also would want to support and not cause conflict in personal wishes knowing we are not the ones encountering distress. She is emotional verbalizing understanding.   Patient is requesting ongoing support. Palliative will continue to support and  follow.   Assessment & Plan:   SUMMARY OF RECOMMENDATIONS   Full Code-as confirmed by patient. Encouraged further discussions with family.  Continue with current plan of care. Patient clear in expressed wishes to treat the treatable allowing her an opportunity to live. Is hopeful she will be able to pursue oncological treatment. Realistic in her understanding however also conflicted in son's acceptance. Encouraged ongoing discussions. Offered to facilitate additional discussions. Patient would like to continue to speak with him privately for now but would like to have family discussions in the future.  PMT will continue to support and follow as needed. Please secure chat with urgent needs.  Symptom Management:  Neoplasm related pain Victoria [name] 15 mg every 12 hours Oxycodone 5-10 mg every 4 hours as needed for breakthrough pain Hydromorphone 0.'5mg'$  every 6 hours as needed Constipation Miralax daily Senna daily Nausea  Zofran daily  Anxiety Xanax 0.5 mg twice daily as needed  Palliative Prophylaxis:  Bowel Regimen and Frequent Pain Assessment  Additional Recommendations (Limitations, Scope, Preferences): Full Scope Treatment  Psycho-social/Spiritual:  Desire for further Chaplaincy support: no Additional Recommendations: Ongoing goals of care discussions   Prognosis:  Guarded  Discharge Planning:  To Be Determined   Discussed with: Dr. Jeanell Sparrow, Dr. Sherry Ruffing, Victoria [name], and patient.   Patient expressed understanding and was in agreement with this plan.    Time Total: 55 min   Visit consisted of counseling and education dealing with the complex and emotionally intense issues of symptom management and palliative care in the setting of serious and potentially life-threatening illness.Greater than 50%  of this time was spent counseling and coordinating care related to the above assessment and plan.  Signed by:  Alda Lea, AGPCNP-BC [place]   Phone: [phone] Pager: [phone] Amion: Bjorn Pippin   Thank you for allowing the Palliative Medicine Team to assist in the care of this patient. Please utilize secure chat with additional questions, if there is no response within 30 minutes please call the above phone number. Palliative Medicine Team providers are available by phone from 7am to 5pm daily and can be reached through the team cell phone.  Should this patient require assistance outside of these hours, please call the patient's attending physician.

## 2023-02-06 NOTE — Progress Notes (Signed)
Victoria [name]   DOB:1941-05-25   [id number]   [id number]  Med onc follow up   Subjective: Patient is well-known to me, under my care for her newly diagnosed pancreatic cancer.  She was just discharged from hospital a few days ago, was seen by our genetic counselor this morning in the office, and sent to the emergency room due to hypoxia and tachycardia.  Patient states that she feels fine.  She will be admitted to [facility] for pneumonia.   Objective:  Vitals:   02/06/23 1600 02/06/23 1615  BP: 116/74   Pulse: (!) 103 97  Resp: (!) 8 10  Temp:    SpO2: 94% 95%    Body mass index is 28.66 kg/m. No intake or output data in the 24 hours ending 02/06/23 1807   Sclerae unicteric  Oropharynx clear  No peripheral adenopathy  Lungs bilateral scattered rhonchi  Heart regular rate and rhythm  Abdomen benign  MSK no focal spinal tenderness, no peripheral edema  Neuro nonfocal   CBG (last 3)  Recent Labs    02/03/23 2149 02/04/23 0814 02/04/23 1113  GLUCAP 203* 165* 229*     Labs:  Urine Studies No results for input(s): "UHGB", "CRYS" in the last 72 hours.  Invalid input(s): "UACOL", "UAPR", "USPG", "UPH", "UTP", "UGL", "UKET", "UBIL", "UNIT", "UROB", "ULEU", "UEPI", "UWBC", "URBC", "UBAC", "CAST", "UCOM", "BILUA"  Basic Metabolic Panel: Recent Labs  Lab 01/31/23 0957 02/01/23 0602 02/02/23 0527 02/03/23 0512 02/04/23 0536 02/06/23 1011  NA 135 135 138 137 134* 137  K 3.9 3.9 4.1 4.5 3.9 3.9  CL 96* 95* 99 98 96* 97*  CO2 29 29 29 29 26 25  $ GLUCOSE 245* 194* 166* 155* 186* 235*  BUN 30* 30* 26* 20 19 24*  CREATININE 1.06* 1.02* 0.87 0.93 0.89 1.38*  CALCIUM 8.9 8.7* 8.8* 8.9 8.3* 8.4*  MG 2.0 2.0 2.0 2.3 2.0  --   PHOS 4.0 4.6 3.9 4.1 3.6  --    GFR Estimated Creatinine Clearance: 37.8 mL/min (A) (by C-G formula based on SCr of 1.38 mg/dL (H)). Liver Function Tests: Recent Labs  Lab 02/01/23 0602 02/02/23 0527 02/03/23 0512 02/04/23 0536  02/06/23 1011  AST 83* 89* 95* 85* 90*  ALT 57* 59* 66* 65* 61*  ALKPHOS 207* 222* 245* 255* 243*  BILITOT 1.1 1.0 1.3* 1.1 1.3*  PROT 7.0 6.4* 7.7 6.8 7.0  ALBUMIN 2.8* 2.7* 3.0* 2.7* 2.6*   No results for input(s): "LIPASE", "AMYLASE" in the last 168 hours. No results for input(s): "AMMONIA" in the last 168 hours. Coagulation profile Recent Labs  Lab 02/06/23 1011  INR 2.4*    CBC: Recent Labs  Lab 02/01/23 0602 02/02/23 0527 02/03/23 0512 02/04/23 0536 02/06/23 1011  WBC 8.2 7.6 9.6 9.0 12.7*  NEUTROABS 6.2 5.8 6.0 6.8 11.0*  HGB 8.9* 8.6* 9.4* 8.7* 8.6*  HCT 28.7* 27.4* 30.2* 28.0* 28.5*  MCV 89.4 89.8 90.7 89.7 91.6  PLT 214 196 231 201 253   Cardiac Enzymes: No results for input(s): "CKTOTAL", "CKMB", "CKMBINDEX", "TROPONINI" in the last 168 hours. BNP: Invalid input(s): "POCBNP" CBG: Recent Labs  Lab 02/03/23 1133 02/03/23 1625 02/03/23 2149 02/04/23 0814 02/04/23 1113  GLUCAP 181* 128* 203* 165* 229*   D-Dimer No results for input(s): "DDIMER" in the last 72 hours. Hgb A1c No results for input(s): "HGBA1C" in the last 72 hours. Lipid Profile No results for input(s): "CHOL", "HDL", "LDLCALC", "TRIG", "CHOLHDL", "LDLDIRECT" in the last 72 hours. Thyroid  function studies No results for input(s): "TSH", "T4TOTAL", "T3FREE", "THYROIDAB" in the last 72 hours.  Invalid input(s): "FREET3" Anemia work up No results for input(s): "VITAMINB12", "FOLATE", "FERRITIN", "TIBC", "IRON", "RETICCTPCT" in the last 72 hours. Microbiology No results found for this or any previous visit (from the past 240 hour(s)).    Studies:  CT Angio Chest PE W and/or Wo Contrast  Result Date: 02/06/2023 CLINICAL DATA:  SOB EXAM: CT ANGIOGRAPHY CHEST WITH CONTRAST TECHNIQUE: Multidetector CT imaging of the chest was performed using the standard protocol during bolus administration of intravenous contrast. Multiplanar CT image reconstructions and MIPs were obtained to evaluate  the vascular anatomy. RADIATION DOSE REDUCTION: This exam was performed according to the departmental dose-optimization program which includes automated exposure control, adjustment of the mA and/or kV according to patient size and/or use of iterative reconstruction technique. CONTRAST:  75 mL OMNIPAQUE IOHEXOL 350 MG/ML SOLN COMPARISON:  01/13/2023 FINDINGS: Cardiovascular: Satisfactory opacification of the pulmonary arteries to the segmental level. No evidence of pulmonary embolism. Normal heart size. No pericardial effusion. Atheromatous changes of the aorta and coronary arteries. No aortic aneurysm or dissection identified. Mediastinum/Nodes: Right paratracheal node measures 1 cm. Left prevascular node measures 1 cm. Superior mediastinal structures unremarkable. Lungs/Pleura: There is extensive diffuse bilateral nodular interstitial process with innumerable lesions. This is consistent with an atypical infection or metastatic disease and there has been no appreciable interval change compared to the prior study from January 2024. Upper Abdomen: Ill-defined mass pancreatic head not fully imaged on this study. Musculoskeletal: Thoracic degenerative changes noted. No osteolytic or osteoblastic lesions identified. Review of the MIP images confirms the above findings. IMPRESSION: 1. No PE, aneurysm or dissection. 2. Extensive nodular interstitial process throughout the lungs consistent with atypical infection versus metastatic disease without definite interval change. 3. Nonspecific mildly prominent mediastinal nodes. 4. Pancreatic head mass that was not fully imaged on this study. Electronically Signed   By: Sammie Bench M.D.   On: 02/06/2023 12:33   DG Chest Port 1 View  Result Date: 02/06/2023 CLINICAL DATA:  Dyspnea EXAM: PORTABLE CHEST 1 VIEW COMPARISON:  02/04/2023 FINDINGS: The heart size and mediastinal contours are within normal limits. Diffuse bilateral interstitial pulmonary opacity. The visualized  skeletal structures are unremarkable. IMPRESSION: Diffuse bilateral interstitial pulmonary opacity, consistent with edema or atypical/viral infection. No new or focal airspace opacity. Electronically Signed   By: Delanna Ahmadi M.D.   On: 02/06/2023 10:20    Assessment: 82 y.o. female   HCAP with hypoxia  Newly diagnosed pancreatic cancer, metastatic History of PE/DVT Hypertension Diabetes    Plan:  -this is her third hospital admission in the past months, for similar reasons. -I agree with broad antibiotics for her presumed anemia.  But I suspect she has pulmonary metastasis from her pancreatic cancer, which causing her hypoxia and respiratory symptoms. -I again reviewed the poor prognosis from oncologist standpoint, chemo option versus palliative care alone.  She strongly wants to try chemotherapy, not ready for hospice.  She is full code. -She is scheduled for port placement tomorrow, please proceed if she is clinically stable. -I will follow-up as needed during her hospital stay, and the plan to get her back to clinic to start chemo with single agent gemcitabine next week.   Truitt Merle, MD 02/06/2023  6:07 PM

## 2023-02-06 NOTE — ED Provider Notes (Signed)
[place] EMERGENCY DEPARTMENT AT [facility] Provider Note   CSN: [id number] Arrival date & time: 02/06/23  [id number]     History  Chief Complaint  Patient presents with   Weakness    Victoria [name] is a 82 y.o. female.  HPI  82 year old female with discharged yesterday after admission for multifocal pneumonia, history of DVT and PE, on chronic anticoagulation, diabetes, and pancreatic cancer.  She was admitted from February 2 to February 04, 2023.  She was planned for discharge and then had worsening dyspnea.  Pulmonary consult was obtained and they recommended no changes to her plan of care.  She had continued dyspnea and desaturation on home oxygen screen.  Palliative care consult was obtained.  During that time chest x-Jeray Shugart showed no significant change in diffuse interstitial prominence and mild by basilar airspace disease.  She was scheduled to have a Port-A-Cath placed today and presented to the oncology center.  Held her Eliquis this morning secondary to this.  She was noted to have low oxygen saturations reportedly in the 80s and then up to 90% on room air and increased to 94% on 2 L.  She was sent to the ED for further evaluation.  CT scan of the lungs from 23 reviewed notes scattered clusters of groundglass nodular densities throughout the lungs may represent atypical infection or metastatic disease.  No evidence of CT embolism at that time From oncology note 02/03/23 Expand All Collapse All  Victoria [name]   DOB:[birth year]   [id number]   [id number]   Oncology follow up    Subjective: Patient is known to me, I met her in the hospital during her last admission.  She was readmitted on 01/24/2023 for dyspnea.  She has been treated for multifocal pneumonia.  She was initially planned to be discharged yesterday, but became more dyspneic and was evaluated by pulmonary.  When I saw her in the late afternoon today, she states she feels better, not on oxygen supplement,  she appears to be anxious, she complains she is not getting pain medication quickly when she request, especially at night.      Ob    Assessment: 82 y.o.     Multifocal pneumonia Diastolic CHF exacerbation Acute respiratory failure with hypoxia secondary to #1-2, improved Newly diagnosed metastatic pancreatic cancer Hypertension Type 2 diabetes History of DVT/PE Liver cirrhosis from hepatitis C Moderate protein calorie malnutrition     Plan:  -She has been in the hospital for 10 days, overall improved -I again reviewed her pancreatic cancer diagnosis and staging, unfortunately she has retroperitoneal lymph node metastasis, and possible lung metastasis.  Her pancreatic cancer is not curable. -Due to her multiple medical comorbidities, her advanced age, poor performance status, she is not a candidate for intensive chemotherapy.  I again discussed with the option of single agent gemcitabine, versus supportive care/hospice.  She expressed her wishes to try chemo.  I will see her back in office later this week, to prepare her chemo. -if IR can do port placement before discharge, it will be great. But I do not want to hold on her discharge for port placement, we can schedule after her discharge.  Patient is agreeable to start chemo with peripheral access.      Home Medications Prior to Admission medications   Medication Sig Start Date End Date Taking? Authorizing Provider  albuterol (VENTOLIN HFA) 108 (90 Base) MCG/ACT inhaler Inhale 2 puffs into the lungs every 6 (six) hours as needed for  wheezing or shortness of breath. 08/15/22   Baxley, Cresenciano Lick, MD  ALPRAZolam (XANAX) 0.5 MG tablet TAKE 1 TABLET(0.5 MG) BY MOUTH TWICE DAILY AS NEEDED FOR ANXIETY Patient taking differently: Take 0.5 mg by mouth See admin instructions. Take 0.5 mg by mouth in the morning and evening 08/12/22   Elby Showers, MD  apixaban (ELIQUIS) 5 MG TABS tablet Take 1 tablet (5 mg total) by mouth 2 (two) times daily.  01/11/23   Mansouraty, Telford Nab., MD  B Complex-C (B-COMPLEX WITH VITAMIN C) tablet Take 1 tablet by mouth daily. 01/20/23   Lavina Hamman, MD  benzonatate (TESSALON PERLES) 100 MG capsule Take 1 capsule (100 mg total) by mouth 3 (three) times daily as needed for cough. 01/20/23 01/20/24  Lavina Hamman, MD  Blood Glucose Monitoring Suppl (TRUE METRIX METER) w/Device KIT USE AS DIRECTED 07/30/21   Elby Showers, MD  cetirizine (ZYRTEC) 10 MG tablet Take 10 mg by mouth daily as needed (allergies).    [provider]  chlorpheniramine-HYDROcodone (TUSSIONEX) 10-8 MG/5ML Take 5 mLs by mouth every 12 (twelve) hours as needed for cough. 01/20/23   Lavina Hamman, MD  cholecalciferol (VITAMIN D3) 25 MCG (1000 UNIT) tablet Take 1,000 Units by mouth in the morning.    [provider]  ferrous sulfate 325 (65 FE) MG tablet Take 1 tablet (325 mg total) by mouth daily. 01/20/23 01/20/24  Lavina Hamman, MD  furosemide (LASIX) 20 MG tablet Take 1 tablet (20 mg total) by mouth daily. 02/04/23 02/04/24  Raiford Noble Latif, DO  glucose blood (TRUE METRIX BLOOD GLUCOSE TEST) test strip TEST BLOOD SUGAR TWICE DAILY 10/10/22   Elby Showers, MD  guaiFENesin (MUCINEX) 600 MG 12 hr tablet Take 1 tablet (600 mg total) by mouth 2 (two) times daily. Patient not taking: Reported on 01/24/2023 01/20/23 01/20/24  Lavina Hamman, MD  HYDROcodone-acetaminophen (NORCO/VICODIN) 5-325 MG tablet Take 1 tablet by mouth every 6 (six) hours as needed. Patient taking differently: Take 1 tablet by mouth every 6 (six) hours as needed for moderate pain or severe pain. 12/27/22   Dorie Rank, MD  Lancets (FREESTYLE) lancets CHECK GLUCOSE TWICE A DAY 01/26/20   Elby Showers, MD  loperamide (IMODIUM) 2 MG capsule Take 2-4 mg by mouth 3 (three) times daily as needed for diarrhea or loose stools.    [provider]  losartan (COZAAR) 100 MG tablet Take 1 tablet (100 mg total) by mouth daily. 02/05/23   Raiford Noble Latif,  DO  morphine (MS CONTIN) 15 MG 12 hr tablet Take 1 tablet (15 mg total) by mouth every 12 (twelve) hours. 02/02/23   Pickenpack-Cousar, Carlena Sax, NP  Multiple Vitamin (MULTIVITAMIN WITH MINERALS) TABS tablet Take 1 tablet by mouth in the morning.    [provider]  ondansetron (ZOFRAN) 4 MG tablet Take 1 tablet (4 mg total) by mouth every 6 (six) hours as needed for nausea. 02/04/23   Raiford Noble Latif, DO  oxyCODONE (OXY IR/ROXICODONE) 5 MG immediate release tablet Take 1-2 tablets (5-10 mg total) by mouth every 4 (four) hours as needed for moderate pain, severe pain or breakthrough pain. 02/02/23   Pickenpack-Cousar, Carlena Sax, NP  oxymetazoline (AFRIN) 0.05 % nasal spray Place 2 sprays into both nostrils 2 (two) times daily as needed for congestion. 01/20/23 01/20/24  Lavina Hamman, MD  pantoprazole (PROTONIX) 40 MG tablet Take 1 tablet (40 mg total) by mouth daily. Patient taking differently: Take  40 mg by mouth daily before breakfast. 12/25/22   Baxley, Cresenciano Lick, MD  polyethylene glycol (MIRALAX / GLYCOLAX) 17 g packet Take 17 g by mouth daily. 02/05/23   Sheikh, Omair Latif, DO  senna-docusate (SENOKOT-S) 8.6-50 MG tablet Take 1 tablet by mouth at bedtime. 02/04/23   Raiford Noble Latif, DO      Allergies    Lisinopril    Review of Systems   Review of Systems  Physical Exam Updated Vital Signs BP 99/69 (BP Location: Left Arm)   Pulse 92   Temp 97.7 F (36.5 C) (Oral)   Resp 15   Ht 1.727 m (5' 8"$ )   Wt 85.5 kg   SpO2 99%   BMI 28.66 kg/m  Physical Exam Vitals and nursing note reviewed.  Constitutional:      Appearance: Normal appearance. She is ill-appearing.  HENT:     Head: Normocephalic.     Right Ear: External ear normal.     Left Ear: External ear normal.     Nose: Nose normal.     Mouth/Throat:     Mouth: Mucous membranes are dry.     Pharynx: Oropharynx is clear.  Eyes:     Pupils: Pupils are equal, round, and reactive to light.  Cardiovascular:     Rate  and Rhythm: Regular rhythm. Tachycardia present.     Pulses: Normal pulses.     Heart sounds: Normal heart sounds.  Pulmonary:     Effort: Pulmonary effort is normal.     Breath sounds: Normal breath sounds.  Abdominal:     Palpations: Abdomen is soft.  Musculoskeletal:        General: Normal range of motion.     Cervical back: Normal range of motion.  Skin:    General: Skin is warm and dry.     Capillary Refill: Capillary refill takes less than 2 seconds.  Neurological:     General: No focal deficit present.     Mental Status: She is alert.  Psychiatric:        Mood and Affect: Mood normal.     ED Results / Procedures / Treatments   Labs (all labs ordered are listed, but only abnormal results are displayed) Labs Reviewed  CBC WITH DIFFERENTIAL/PLATELET - Abnormal; Notable for the following components:      Result Value   WBC 12.7 (*)    RBC 3.11 (*)    Hemoglobin 8.6 (*)    HCT 28.5 (*)    RDW 17.6 (*)    Neutro Abs 11.0 (*)    All other components within normal limits  COMPREHENSIVE METABOLIC PANEL - Abnormal; Notable for the following components:   Chloride 97 (*)    Glucose, Bld 235 (*)    BUN 24 (*)    Creatinine, Ser 1.38 (*)    Calcium 8.4 (*)    Albumin 2.6 (*)    AST 90 (*)    ALT 61 (*)    Alkaline Phosphatase 243 (*)    Total Bilirubin 1.3 (*)    GFR, Estimated 39 (*)    All other components within normal limits  PROTIME-INR - Abnormal; Notable for the following components:   Prothrombin Time 25.5 (*)    INR 2.4 (*)    All other components within normal limits  BLOOD GAS, VENOUS - Abnormal; Notable for the following components:   pO2, Ven <31 (*)    Bicarbonate 31.6 (*)    Acid-Base Excess 4.7 (*)  All other components within normal limits  CULTURE, BLOOD (ROUTINE X 2)  CULTURE, BLOOD (ROUTINE X 2)  BRAIN NATRIURETIC PEPTIDE    EKG EKG Interpretation  Date/Time:  Thursday February 06 2023 09:36:07 EST Ventricular Rate:  114 PR  Interval:  138 QRS Duration: 90 QT Interval:  322 QTC Calculation: 444 R Axis:   37 Text Interpretation: Sinus tachycardia Low voltage, extremity leads Confirmed by Pattricia Boss [phone]) on 02/06/2023 10:06:30 AM  Radiology CT Angio Chest PE W and/or Wo Contrast  Result Date: 02/06/2023 CLINICAL DATA:  SOB EXAM: CT ANGIOGRAPHY CHEST WITH CONTRAST TECHNIQUE: Multidetector CT imaging of the chest was performed using the standard protocol during bolus administration of intravenous contrast. Multiplanar CT image reconstructions and MIPs were obtained to evaluate the vascular anatomy. RADIATION DOSE REDUCTION: This exam was performed according to the departmental dose-optimization program which includes automated exposure control, adjustment of the mA and/or kV according to patient size and/or use of iterative reconstruction technique. CONTRAST:  75 mL OMNIPAQUE IOHEXOL 350 MG/ML SOLN COMPARISON:  01/13/2023 FINDINGS: Cardiovascular: Satisfactory opacification of the pulmonary arteries to the segmental level. No evidence of pulmonary embolism. Normal heart size. No pericardial effusion. Atheromatous changes of the aorta and coronary arteries. No aortic aneurysm or dissection identified. Mediastinum/Nodes: Right paratracheal node measures 1 cm. Left prevascular node measures 1 cm. Superior mediastinal structures unremarkable. Lungs/Pleura: There is extensive diffuse bilateral nodular interstitial process with innumerable lesions. This is consistent with an atypical infection or metastatic disease and there has been no appreciable interval change compared to the prior study from January 2024. Upper Abdomen: Ill-defined mass pancreatic head not fully imaged on this study. Musculoskeletal: Thoracic degenerative changes noted. No osteolytic or osteoblastic lesions identified. Review of the MIP images confirms the above findings. IMPRESSION: 1. No PE, aneurysm or dissection. 2. Extensive nodular interstitial process  throughout the lungs consistent with atypical infection versus metastatic disease without definite interval change. 3. Nonspecific mildly prominent mediastinal nodes. 4. Pancreatic head mass that was not fully imaged on this study. Electronically Signed   By: Sammie Bench M.D.   On: 02/06/2023 12:33   DG Chest Port 1 View  Result Date: 02/06/2023 CLINICAL DATA:  Dyspnea EXAM: PORTABLE CHEST 1 VIEW COMPARISON:  02/04/2023 FINDINGS: The heart size and mediastinal contours are within normal limits. Diffuse bilateral interstitial pulmonary opacity. The visualized skeletal structures are unremarkable. IMPRESSION: Diffuse bilateral interstitial pulmonary opacity, consistent with edema or atypical/viral infection. No new or focal airspace opacity. Electronically Signed   By: Delanna Ahmadi M.D.   On: 02/06/2023 10:20    Procedures Procedures    Medications Ordered in ED Medications  sodium chloride (PF) 0.9 % injection (has no administration in time range)  HYDROmorphone (DILAUDID) injection 0.5 mg (has no administration in time range)  piperacillin-tazobactam (ZOSYN) IVPB 3.375 g (has no administration in time range)  HYDROmorphone (DILAUDID) injection 0.5 mg (0.5 mg Intravenous Given 02/06/23 1116)  iohexol (OMNIPAQUE) 350 MG/ML injection 100 mL (75 mLs Intravenous Contrast Given 02/06/23 1205)  HYDROmorphone (DILAUDID) injection 0.5 mg (0.5 mg Intravenous Given 02/06/23 1403)    ED Course/ Medical Decision Making/ A&P Clinical Course as of 02/06/23 1547  Thu Feb 06, 2023  1206 CBC reviewed interpreted leukocytosis noted with stable anemia [DR]  1206 INR increased to 2.4 from first prior 1.8 [DR]  1206 Creatinine increased to 1.38 from baseline of 0.9 [DR]  1207 Chest  x-Mega Kinkade reviewed interpreted significant for diffuse bilateral interstitial pulmonary opacities consistent with edema or  atypical viral infection [DR]  20 CT reviewed and interpreted with continued extensive nodular interstitial  process without definite interval change from prior per radiologist interpretation [DR]    Clinical Course User Index [DR] Pattricia Boss, MD                             Medical Decision Making Amount and/or Complexity of Data Reviewed Labs: ordered. Radiology: ordered.  Risk Prescription drug management.  82 year old female with metastatic pancreatic cancer who is not a candidate for aggressive chemotherapeutic management, but does wish to try single agent.  She has been hospitalized from 2-20 4-2 1324 with dyspnea and multifocal pneumonia.  She was discharged home on February 13.  Today, she went in to have her palliative care appointment and to have a port placed.  She had desaturation into the 80s with able to get up to 90 prehospital with some oxygen.  Here in the ED resting in her bed her oxygen saturations have been 95 to 97% on oxygen. Patient was reevaluated with chest x-Tykeisha Peer and CT.  Appears to have ongoing multifocal changes.  These does not appear to have had significant interval change She will need to be admitted for oxygen therapy and will need to be qualified for home oxygen Patient has some leukocytosis.  Continues to have multifocal infiltrates.   Discussed with Vinie Sill on for palliative care as patient does not wish to be on hospice at this time, she will need to be qualified for oxygen.  IV zosyn to cover for pneumonia. Discussed care with Dr. Edward Qualia who has assumed patient care.       Final Clinical Impression(s) / ED Diagnoses Final diagnoses:  Pneumonia due to infectious organism, unspecified laterality, unspecified part of lung  Metastatic malignant neoplasm, unspecified site [facility])    Rx / DC Orders ED Discharge Orders     None         Pattricia Boss, MD 02/06/23 1547

## 2023-02-06 NOTE — Progress Notes (Deleted)
Palliative Care Consult Note                                  Date: 02/06/2023   Patient Name: Victoria [name]  DOB: 1941-10-22  MRN: [id number]  Age / Sex: 82 y.o., female  PCP: Elby Showers, MD Referring Physician: Tegeler, Gwenyth Allegra, *  Reason for Consultation: Disposition, Establishing goals of care, Non pain symptom management, and Pain control  HPI/Patient Profile: Palliative Care consult requested for goals of care discussion in this 82 y.o. female  with past medical history of hepatitis C, right breast cancer s/p lumpectomy, type 2 diabetes, hypertension, thrombocytopenia, PE/DVT, and recent diagnosis of pancreatic cancer (12/2022) followed by Dr. Burr Medico with plans to initiate single agent treatment. Patient was seen during previous admission, discharged on 02/04/2023, now patient is currently admitted on 02/06/2023 with new hypoxia.   Past Medical History:  Diagnosis Date   Abnormal finding on Pap smear    Allergy    SEASONAL   Anxiety    Breast cancer ([place]) 2000   right breast lumptectomy, radiation done   Cirrhosis ([place]) 11/2012   resolved per pt   Diabetes mellitus type 2, controlled ([place])    Hepatitis C    took tx for    Hypertension    Obesity    Thrombocytopenia ([place]) 11/2012     Subjective:   This NP Osborne Oman reviewed medical records, received report from team, assessed the patient and then met at the patient's bedside with [name] to discuss diagnosis, prognosis, GOC, EOL wishes disposition and options. No family present.   Patient is sitting upright in bed. No acute distress noted. Does complain of abdominal discomfort and is awaiting pain medication. States she feels that her breathing is much better since oxygen has been applied. Alert and able to engage appropriately in discussions.    Patient is familiar to myself and the palliative team. Was seen during recent admission and scheduled for follow-up  with myself today at [facility] however was unable to keep appointment due to hypoxia requiring further ED evaluation. Values and goals of care important to patient and family were attempted to be elicited.  I created space and opportunity for patient to explore state of health prior to admission, thoughts, and feelings.   [name] reports she was somewhat short of breath at home on yesterday however seemed to improve throughout the day. Reports feelings of dyspnea more so with exertion. Cough with clear mucous. Denies recent fever. Endorses fatigue.   We discussed Her current illness and what it means in the larger context of Her on-going co-morbidities. Natural disease trajectory and expectations were discussed.  Reagann verbalized understanding of current illness and co-morbidities.   She is asking if she can have oxygen at home once discharged. Advised if criteria is met this will be arranged. I had open and honest discussion with patient regarding her poor overall functional status with concerns for ability to pursue oncological interventions out of concern for tolerance. I also discussed with her Oncology team (Dr. Audie Box, NP). Ms. kennetha ludwig understanding. She is emotional expressing her realistic understanding but also hoping she can at least attempt treatments to allow her an opportunity to live. She expresses her anxiety regarding the fear of unknown and feelings of being overwhelmed with diagnosis etc.   I asked if she and her family discussed goals of care further  reviewing previous discussions had with son, Pleas Mairi during last admission. [name] shares her son is having a difficult time accepting the extent of her cancer. She knows that at some point she will continue to decline and pass away but is guarding her children. Emotional support provided. I offered to facilitate discussions with family to support her and answer questions they may have. She verbalized  appreciation however would like to continue discussions amongst themselves to allow their acceptance but would openly accept in the near future.   We discussed [name]'s ongoing dyspnea and abdominal pain. She confirmed that she picked up her pain medication and have been taking since discharge. Pain is well controlled on regimen. Does require breakthrough medication every 4-5 hours. Again expresses improvement since starting MS Contin. She understands we will continue to closely monitor and adjust as needed.   I discussed the importance of continued conversation with family and their medical providers regarding overall plan of care and treatment options, ensuring decisions are within the context of the patients values and GOCs.  Questions and concerns were addressed.Patient  was encouraged to call with questions or concerns.  PMT will continue to support holistically as needed.   Objective:   Primary Diagnoses: Present on Admission: **None**   Scheduled Meds:  sodium chloride (PF)        Continuous Infusions:  piperacillin-tazobactam      PRN Meds: sodium chloride (PF)  Allergies  Allergen Reactions   Lisinopril Cough    Review of Systems  Constitutional:  Positive for activity change and fatigue.  Respiratory:  Positive for cough and shortness of breath.   Gastrointestinal:  Positive for abdominal pain.  Unless otherwise noted, a complete review of systems is negative.  Physical Exam General: NAD Cardiovascular: regular rate and rhythm Pulmonary: diminished bilaterally, congested cough, 2L nasal cannula  Abdomen: soft, tender, obese, + bowel sounds Extremities: no edema, no joint deformities Skin: no rashes, warm and dry Neurological: AAO x3, mood appropriate   Vital Signs:  BP 99/69 (BP Location: Left Arm)   Pulse 92   Temp 97.7 F (36.5 C) (Oral)   Resp 15   Ht 5' 8"$  (1.727 m)   Wt 85.5 kg   SpO2 99%   BMI 28.66 kg/m  Pain Scale: 0-10   Pain Score: 5    SpO2: SpO2: 99 % O2 Device:SpO2: 99 % O2 Flow Rate: .O2 Flow Rate (L/min): 2 L/min  IO: Intake/output summary: No intake or output data in the 24 hours ending 02/06/23 1619  LBM:   Baseline Weight: Weight: 85.5 kg Most recent weight: Weight: 85.5 kg      Palliative Assessment/Data:    Advanced Care Planning:   Primary Decision Maker: PATIENT, patient identified Quinton, her son, as her next decision maker  Code Status/Advance Care Planning: Full code  A discussion was had today regarding advanced directives. Concepts specific to code status, artifical feeding and hydration, continued IV antibiotics and rehospitalization was had.  The difference between a aggressive medical intervention path and a palliative comfort care path was discussed.   Ms. Edens is clear in expressed wishes to continue to treat the treatable allowing her every opportunity to thrive. She understands her cancer and trajectory. Is remaining hopeful that she can get to a point to at least attempt chemotherapy as she is not ready to "give up".   I empathetically approached discussions regarding code status. She expresses her awareness of importance of making decisions regarding DNR/DNI. She does not wish to  suffer or her life be prolonged with no meaningful recovery. Ms. Beaton expresses wishes to remain a full code at this time again expressing her concern with her son's difficulty accepting her condition. States plans to discuss code status further with him as it is important to her to know he is at peace. I expressed understanding also emphasizing that patient also has to do what is best for her and honor her own wishes knowing that although no family wishes to lose a loved one also would want to support and not cause conflict in personal wishes knowing we are not the ones encountering distress. She is emotional verbalizing understanding.   Patient is requesting ongoing support. Palliative will continue to support  and follow.   Assessment & Plan:   SUMMARY OF RECOMMENDATIONS   Full Code-as confirmed by patient. Encouraged further discussions with family.  Continue with current plan of care. Patient clear in expressed wishes to treat the treatable allowing her an opportunity to live. Is hopeful she will be able to pursue oncological treatment. Realistic in her understanding however also conflicted in son's acceptance. Encouraged ongoing discussions. Offered to facilitate additional discussions. Patient would like to continue to speak with him privately for now but would like to have family discussions in the future.  PMT will continue to support and follow as needed. Please secure chat with urgent needs.  Symptom Management:  Neoplasm related pain MS Contin 15 mg every 12 hours Oxycodone 5-10 mg every 4 hours as needed for breakthrough pain Hydromorphone 0.49m every 6 hours as needed Constipation Miralax daily Senna daily Nausea  Zofran daily  Anxiety Xanax 0.5 mg twice daily as needed  Palliative Prophylaxis:  Bowel Regimen and Frequent Pain Assessment  Additional Recommendations (Limitations, Scope, Preferences): Full Scope Treatment  Psycho-social/Spiritual:  Desire for further Chaplaincy support: no Additional Recommendations:  Ongoing goals of care discussions   Prognosis:  Guarded  Discharge Planning:  To Be Determined   Discussed with: Dr. RJeanell Sparrow Dr. TSherry Ruffing Dr. FAudie Box NP, and patient.   Patient expressed understanding and was in agreement with this plan.    Time Total: 55 min   Visit consisted of counseling and education dealing with the complex and emotionally intense issues of symptom management and palliative care in the setting of serious and potentially life-threatening illness.Greater than 50%  of this time was spent counseling and coordinating care related to the above assessment and plan.  Signed by:  NAlda Lea AGPCNP-BC PFishers Landing  Phone: [phone]: [phone]: NBjorn Pippin  Thank you for allowing the Palliative Medicine Team to assist in the care of this patient. Please utilize secure chat with additional questions, if there is no response within 30 minutes please call the above phone number. Palliative Medicine Team providers are available by phone from 7am to 5pm daily and can be reached through the team cell phone.  Should this patient require assistance outside of these hours, please call the patient's attending physician.

## 2023-02-06 NOTE — Progress Notes (Signed)
Pharmacy Antibiotic Note  Victoria [name] is a 82 y.o. female admitted on 02/06/2023 with  hypoxia . PMH significant for pancreatic cancer with recent admission for PNA.  Pharmacy has been consulted for vancomycin dosing.  Today, 02/06/23 WBC elevated SCr elevated from baseline, CrCl ~38 mL/min  Plan: Cefepime 2 g IV q12h Vancomycin 1500 mg loading dose followed by 1000 mg IV q24h for estimated AUC of 506. Goal AUC 400-550.  MRSA PCR ordered. If negative, recommend to discontinue vancomycin for PNA.  Follow renal function. Check vancomycin levels once at steady state as needed.   Height: 5' 8"$  (172.7 cm) Weight: 85.5 kg (188 lb 7.9 oz) IBW/kg (Calculated) : 63.9  Temp (24hrs), Avg:97.7 F (36.5 C), Min:97.6 F (36.4 C), Max:97.7 F (36.5 C)  Recent Labs  Lab 02/01/23 0602 02/02/23 0527 02/03/23 0512 02/04/23 0536 02/06/23 1011  WBC 8.2 7.6 9.6 9.0 12.7*  CREATININE 1.02* 0.87 0.93 0.89 1.38*    Estimated Creatinine Clearance: 37.8 mL/min (A) (by C-G formula based on SCr of 1.38 mg/dL (H)).    Allergies  Allergen Reactions   Lisinopril Cough    Antimicrobials this admission: Piperacillin/tazobactam 2/15 x1  cefepime 2/15 >>  Vancomycin 2/15 >>  Dose adjustments this admission:  Microbiology results: 2/15 BCx:  2/15 MRSA PCR:   Lenis Noon, PharmD 02/06/2023 5:30 PM

## 2023-02-06 NOTE — ED Triage Notes (Signed)
[facility] c/o shortness of breath and weakness.

## 2023-02-06 NOTE — Progress Notes (Signed)
Pharmacy Note   A consult was received from an ED physician for ZOsyn per pharmacy dosing.    The patient's profile has been reviewed for ht/wt/allergies/indication/available labs.    A one time order has been placed for Zosyn 3.375 gr IV x1 .    Further antibiotics/pharmacy consults should be ordered by admitting physician if indicated.                       Thank you,  Royetta Asal, PharmD, BCPS 02/06/2023 3:43 PM

## 2023-02-06 NOTE — Progress Notes (Deleted)
[place]  [phone] [phone]   Name: Victoria [name] Date: 02/06/2023 MRN: [id number]  DOB: 1941/01/04  Patient Care Team: Elby Showers, MD as PCP - General (Internal Medicine) Inda Castle, MD (Inactive) as Consulting Physician (Gastroenterology) Truitt Merle, MD as Consulting Physician (Oncology)    REASON FOR CONSULTATION: [name] is a 82 y.o. female with oncologic medical history including hepatitis C, right breast cancer s/p lumpectomy, type 2 diabetes, hypertension, thrombocytopenia, PE/DVT, and recent diagnosis of pancreatic cancer (12/2022) followed by Dr. Burr Medico with plans to initiate single agent treatment . Palliative ask to see for symptom and pain management and goals of care.    SOCIAL HISTORY:     reports that she has never smoked. She has never used smokeless tobacco. She reports that she does not drink alcohol and does not use drugs.  ADVANCE DIRECTIVES:  None on file  CODE STATUS: Full code  PAST MEDICAL HISTORY: Past Medical History:  Diagnosis Date   Abnormal finding on Pap smear    Allergy    SEASONAL   Anxiety    Breast cancer ([place]) 2000   right breast lumptectomy, radiation done   Cirrhosis ([place]) 11/2012   resolved per pt   Diabetes mellitus type 2, controlled ([place])    Hepatitis C    took tx for    Hypertension    Obesity    Thrombocytopenia ([place]) 11/2012    PAST SURGICAL HISTORY:  Past Surgical History:  Procedure Laterality Date   ANTERIOR AND POSTERIOR REPAIR N/A 02/28/2021   Procedure: ANTERIOR (CYSTOCELE) AND POSTERIOR REPAIR (RECTOCELE);  Surgeon: Cheri Fowler, MD;  Location: [facility];  Service: Gynecology;  Laterality: N/A;   BIOPSY  01/09/2023   Procedure: BIOPSY;  Surgeon: Irving Copas., MD;  Location: [facility];  Service: Gastroenterology;;   BREAST BIOPSY Left 09/09/2016   BREAST LUMPECTOMY  right   BREAST LUMPECTOMY      BREAST REDUCTION SURGERY  2005   CATARACT EXTRACTION, BILATERAL  2014   CHOLECYSTECTOMY N/A 03/30/2013   Procedure: LAPAROSCOPIC CHOLECYSTECTOMY WITH INTRAOPERATIVE CHOLANGIOGRAM;  Surgeon: Shann Medal, MD;  Location: [facility];  Service: General;  Laterality: N/A;   COLONOSCOPY  2021   CYSTOSCOPY N/A 02/28/2021   Procedure: Consuela Mimes;  Surgeon: Cheri Fowler, MD;  Location: [place];  Service: Gynecology;  Laterality: N/A;   CYSTOSCOPY W/ RETROGRADES Left 02/28/2021   Procedure: CYSTOSCOPY WITH RETROGRADE PYELOGRAM;  Surgeon: Cheri Fowler, MD;  Location: [facility] [facility];  Service: Gynecology;  Laterality: Left;   ESOPHAGOGASTRODUODENOSCOPY (EGD) WITH PROPOFOL N/A 11/25/2022   Procedure: ESOPHAGOGASTRODUODENOSCOPY (EGD) WITH PROPOFOL;  Surgeon: Carol Ada, MD;  Location: [place];  Service: Gastroenterology;  Laterality: N/A;   ESOPHAGOGASTRODUODENOSCOPY (EGD) WITH PROPOFOL N/A 01/09/2023   Procedure: ESOPHAGOGASTRODUODENOSCOPY (EGD) WITH PROPOFOL;  Surgeon: Rush Landmark Telford Nab., MD;  [facility];  Service: Gastroenterology;  Laterality: N/A;   EUS  01/28/2013   Procedure: UPPER ENDOSCOPIC ULTRASOUND (EUS) LINEAR;  Surgeon: Milus Banister, MD;  [facility];  Service: Endoscopy;  Laterality: N/A;  bar steinberger [phone]   EUS Left 11/25/2022   Procedure: UPPER ENDOSCOPIC ULTRASOUND (EUS) LINEAR;  Surgeon: Carol Ada, MD;  Location: [place];  Service: Gastroenterology;  Laterality: Left;   EUS N/A 01/09/2023   Procedure: UPPER ENDOSCOPIC ULTRASOUND (EUS) LINEAR;  Surgeon: Irving Copas., MD;  [facility];  Service: Gastroenterology;  Laterality: N/A;  EXCISION OF SKIN TAG Right 03/30/2013   Procedure: EXCISION OF SKIN TAG;  Surgeon: Shann Medal, MD;  Location: [facility];  Service: General;  Laterality: Right;   FINE NEEDLE ASPIRATION  11/25/2022   Procedure: FINE NEEDLE ASPIRATION (FNA)  LINEAR;  Surgeon: Carol Ada, MD;  Location: [place];  Service: Gastroenterology;;   FINE NEEDLE ASPIRATION N/A 01/09/2023   Procedure: FINE NEEDLE ASPIRATION (FNA) LINEAR;  Surgeon: Irving Copas., MD;  Location: [facility];  Service: Gastroenterology;  Laterality: N/A;   LIVER BIOPSY  03/30/2013   Procedure: LIVER BIOPSY;  Surgeon: Shann Medal, MD;  Location: [facility];  Service: General;;   POLYPECTOMY     REDUCTION MAMMAPLASTY     THYROIDECTOMY, PARTIAL  1972   left   VAGINAL HYSTERECTOMY N/A 02/28/2021   Procedure: HYSTERECTOMY VAGINAL WITH UNILATERAL SALPINGECTOMY;  Surgeon: Cheri Fowler, MD;  Location: [place];  Service: Gynecology;  Laterality: N/A;    HEMATOLOGY/ONCOLOGY HISTORY:  Oncology History  Pancreatic cancer ([place])  01/14/2023 Initial Diagnosis   Pancreatic cancer ([place])   01/27/2023 -  Chemotherapy   Patient is on Treatment Plan : PANCREAS Gemcitabine D1,8,15 (1000) q28d x 4 Cycles       ALLERGIES:  is allergic to lisinopril.  MEDICATIONS:  Current Outpatient Medications  Medication Sig Dispense Refill   albuterol (VENTOLIN HFA) 108 (90 Base) MCG/ACT inhaler Inhale 2 puffs into the lungs every 6 (six) hours as needed for wheezing or shortness of breath. 8 g PRN   ALPRAZolam (XANAX) 0.5 MG tablet TAKE 1 TABLET(0.5 MG) BY MOUTH TWICE DAILY AS NEEDED FOR ANXIETY (Patient taking differently: Take 0.5 mg by mouth See admin instructions. Take 0.5 mg by mouth in the morning and evening) 60 tablet 5   apixaban (ELIQUIS) 5 MG TABS tablet Take 1 tablet (5 mg total) by mouth 2 (two) times daily. 60 tablet 2   B Complex-C (B-COMPLEX WITH VITAMIN C) tablet Take 1 tablet by mouth daily. 120 tablet 0   benzonatate (TESSALON PERLES) 100 MG capsule Take 1 capsule (100 mg total) by mouth 3 (three) times daily as needed for cough. 30 capsule 0   Blood Glucose Monitoring Suppl (TRUE METRIX METER) w/Device KIT USE AS DIRECTED 1 kit prn   cetirizine  (ZYRTEC) 10 MG tablet Take 10 mg by mouth daily as needed (allergies).     chlorpheniramine-HYDROcodone (TUSSIONEX) 10-8 MG/5ML Take 5 mLs by mouth every 12 (twelve) hours as needed for cough. 70 mL 0   cholecalciferol (VITAMIN D3) 25 MCG (1000 UNIT) tablet Take 1,000 Units by mouth in the morning.     ferrous sulfate 325 (65 FE) MG tablet Take 1 tablet (325 mg total) by mouth daily. 30 tablet 3   furosemide (LASIX) 20 MG tablet Take 1 tablet (20 mg total) by mouth daily. 30 tablet 0   glucose blood (TRUE METRIX BLOOD GLUCOSE TEST) test strip TEST BLOOD SUGAR TWICE DAILY 200 strip prn   guaiFENesin (MUCINEX) 600 MG 12 hr tablet Take 1 tablet (600 mg total) by mouth 2 (two) times daily. (Patient not taking: Reported on 01/24/2023) 60 tablet 2   HYDROcodone-acetaminophen (NORCO/VICODIN) 5-325 MG tablet Take 1 tablet by mouth every 6 (six) hours as needed. (Patient taking differently: Take 1 tablet by mouth every 6 (six) hours as needed for moderate pain or severe pain.) 20 tablet 0   Lancets (FREESTYLE) lancets CHECK GLUCOSE TWICE A DAY 100 each 12   loperamide (IMODIUM) 2 MG capsule Take  2-4 mg by mouth 3 (three) times daily as needed for diarrhea or loose stools.     losartan (COZAAR) 100 MG tablet Take 1 tablet (100 mg total) by mouth daily. 30 tablet 0   morphine (MS CONTIN) 15 MG 12 hr tablet Take 1 tablet (15 mg total) by mouth every 12 (twelve) hours. 30 tablet 0   Multiple Vitamin (MULTIVITAMIN WITH MINERALS) TABS tablet Take 1 tablet by mouth in the morning.     ondansetron (ZOFRAN) 4 MG tablet Take 1 tablet (4 mg total) by mouth every 6 (six) hours as needed for nausea. 20 tablet 0   oxyCODONE (OXY IR/ROXICODONE) 5 MG immediate release tablet Take 1-2 tablets (5-10 mg total) by mouth every 4 (four) hours as needed for moderate pain, severe pain or breakthrough pain. 30 tablet 0   oxymetazoline (AFRIN) 0.05 % nasal spray Place 2 sprays into both nostrils 2 (two) times daily as needed for  congestion. 15 mL 0   pantoprazole (PROTONIX) 40 MG tablet Take 1 tablet (40 mg total) by mouth daily. (Patient taking differently: Take 40 mg by mouth daily before breakfast.) 90 tablet 0   polyethylene glycol (MIRALAX / GLYCOLAX) 17 g packet Take 17 g by mouth daily. 14 each 0   senna-docusate (SENOKOT-S) 8.6-50 MG tablet Take 1 tablet by mouth at bedtime. 30 tablet 0   No current facility-administered medications for this visit.    VITAL SIGNS: There were no vitals taken for this visit. There were no vitals filed for this visit.  Estimated body mass index is 28.66 kg/m as calculated from the following:   Height as of 01/24/23: 5' 8"$  (1.727 m).   Weight as of 01/30/23: 188 lb 7.9 oz (85.5 kg).  LABS: CBC:    Component Value Date/Time   WBC 9.0 02/04/2023 0536   HGB 8.7 (L) 02/04/2023 0536   HCT 28.0 (L) 02/04/2023 0536   PLT 201 02/04/2023 0536   MCV 89.7 02/04/2023 0536   NEUTROABS 6.8 02/04/2023 0536   LYMPHSABS 1.0 02/04/2023 0536   MONOABS 0.9 02/04/2023 0536   EOSABS 0.2 02/04/2023 0536   BASOSABS 0.0 02/04/2023 0536   Comprehensive Metabolic Panel:    Component Value Date/Time   NA 134 (L) 02/04/2023 0536   K 3.9 02/04/2023 0536   CL 96 (L) 02/04/2023 0536   CO2 26 02/04/2023 0536   BUN 19 02/04/2023 0536   CREATININE 0.89 02/04/2023 0536   CREATININE 1.03 (H) 05/31/2022 0955   GLUCOSE 186 (H) 02/04/2023 0536   CALCIUM 8.3 (L) 02/04/2023 0536   AST 85 (H) 02/04/2023 0536   ALT 65 (H) 02/04/2023 0536   ALKPHOS 255 (H) 02/04/2023 0536   BILITOT 1.1 02/04/2023 0536   PROT 6.8 02/04/2023 0536   ALBUMIN 2.7 (L) 02/04/2023 0536    RADIOGRAPHIC STUDIES: CT Angio Chest PE W and/or Wo Contrast  Result Date: 01/14/2023 CLINICAL DATA:  Abdominal pain. Also concern for pulmonary embolism. EXAM: CT ANGIOGRAPHY CHEST CT ABDOMEN AND PELVIS WITH CONTRAST TECHNIQUE: Multidetector CT imaging of the chest was performed using the standard protocol during bolus administration of  intravenous contrast. Multiplanar CT image reconstructions and MIPs were obtained to evaluate the vascular anatomy. Multidetector CT imaging of the abdomen and pelvis was performed using the standard protocol during bolus administration of intravenous contrast. RADIATION DOSE REDUCTION: This exam was performed according to the departmental dose-optimization program which includes automated exposure control, adjustment of the mA and/or kV according to patient size and/or use of  iterative reconstruction technique. CONTRAST:  162m OMNIPAQUE IOHEXOL 350 MG/ML SOLN COMPARISON:  CT abdomen pelvis dated 12/27/2022 and chest CT dated 11/21/2022. FINDINGS: Evaluation is limited due to respiratory motion and streak artifact caused by the patient's arms. CTA CHEST FINDINGS Cardiovascular: There is no cardiomegaly or pericardial effusion. Mild atherosclerotic calcification of the thoracic aorta. No aneurysmal dilatation or dissection. Evaluation of the pulmonary arteries is limited due to respiratory motion and suboptimal visualization of the peripheral branches. No obvious large central pulmonary artery embolus identified. Mediastinum/Nodes: Top-normal bilateral hilar lymph nodes measure 1 cm short axis. The esophagus is grossly unremarkable. No mediastinal fluid collection. Lungs/Pleura: Scattered clusters of ground-glass nodular densities throughout the lungs suspicious for atypical infection. Metastatic disease is not excluded clinical correlation is recommended. There is no pleural effusion or pneumothorax. The central airways are patent. Musculoskeletal: Degenerative changes of the spine and osteopenia. No acute osseous pathology. Review of the MIP images confirms the above findings. CT ABDOMEN and PELVIS FINDINGS No intra-abdominal free air or free fluid. Hepatobiliary: Cirrhosis. Mild biliary dilatation versus periportal edema. Cholecystectomy. Pancreas: Ill-defined soft tissue mass in the region of the head of the  pancreas measuring approximately 4.9 x 3.5 cm (22/4). There is atrophy of the body of the pancreas and dilatation of the main pancreatic duct. No active inflammatory changes. Spleen: Normal in size without focal abnormality. Adrenals/Urinary Tract: The adrenal glands unremarkable. Small bilateral renal cysts. There is no hydronephrosis on either side. There is symmetric enhancement and excretion of contrast by both kidneys. The visualized ureters and urinary bladder appear unremarkable. Stomach/Bowel: Severe colonic diverticulosis without active inflammatory changes. There is no bowel obstruction active inflammation. The appendix is normal. Vascular/Lymphatic: Moderate aortoiliac atherosclerotic disease. The IVC is unremarkable. No portal venous gas. Retroperitoneal adenopathy measures 13 mm to the left of the aorta, 17 mm in the periportal region, and 19 mm in the gastrohepatic vision. Reproductive: Hysterectomy.  No adnexal masses. Other: Small fat containing umbilical and supraumbilical hernia. Musculoskeletal: Degenerative changes of the spine. No acute osseous pathology. Review of the MIP images confirms the above findings. IMPRESSION: 1. No CT evidence of central pulmonary artery embolus. 2. Scattered clusters of ground-glass nodular densities throughout the lungs may represent atypical infection or metastatic disease. 3. Ill-defined soft tissue mass in the region of the head of the pancreas with atrophy of the body of the pancreas and dilatation of the main pancreatic duct. Findings are concerning for pancreatic malignancy. Further evaluation with MRI with and without contrast recommended. 4. Retroperitoneal adenopathy. 5. Cirrhosis. 6. Severe colonic diverticulosis without active inflammatory changes. No bowel obstruction. Normal appendix. 7.  Aortic Atherosclerosis (ICD10-I70.0). Electronically Signed   By: AAnner CreteM.D.   On: 01/14/2023 00:04   CT ABDOMEN PELVIS W CONTRAST  Result Date:  01/14/2023 CLINICAL DATA:  Abdominal pain. Also concern for pulmonary embolism. EXAM: CT ANGIOGRAPHY CHEST CT ABDOMEN AND PELVIS WITH CONTRAST TECHNIQUE: Multidetector CT imaging of the chest was performed using the standard protocol during bolus administration of intravenous contrast. Multiplanar CT image reconstructions and MIPs were obtained to evaluate the vascular anatomy. Multidetector CT imaging of the abdomen and pelvis was performed using the standard protocol during bolus administration of intravenous contrast. RADIATION DOSE REDUCTION: This exam was performed according to the departmental dose-optimization program which includes automated exposure control, adjustment of the mA and/or kV according to patient size and/or use of iterative reconstruction technique. CONTRAST:  1063mOMNIPAQUE IOHEXOL 350 MG/ML SOLN COMPARISON:  CT abdomen pelvis dated 12/27/2022  and chest CT dated 11/21/2022. FINDINGS: Evaluation is limited due to respiratory motion and streak artifact caused by the patient's arms. CTA CHEST FINDINGS Cardiovascular: There is no cardiomegaly or pericardial effusion. Mild atherosclerotic calcification of the thoracic aorta. No aneurysmal dilatation or dissection. Evaluation of the pulmonary arteries is limited due to respiratory motion and suboptimal visualization of the peripheral branches. No obvious large central pulmonary artery embolus identified. Mediastinum/Nodes: Top-normal bilateral hilar lymph nodes measure 1 cm short axis. The esophagus is grossly unremarkable. No mediastinal fluid collection. Lungs/Pleura: Scattered clusters of ground-glass nodular densities throughout the lungs suspicious for atypical infection. Metastatic disease is not excluded clinical correlation is recommended. There is no pleural effusion or pneumothorax. The central airways are patent. Musculoskeletal: Degenerative changes of the spine and osteopenia. No acute osseous pathology. Review of the MIP images  confirms the above findings. CT ABDOMEN and PELVIS FINDINGS No intra-abdominal free air or free fluid. Hepatobiliary: Cirrhosis. Mild biliary dilatation versus periportal edema. Cholecystectomy. Pancreas: Ill-defined soft tissue mass in the region of the head of the pancreas measuring approximately 4.9 x 3.5 cm (22/4). There is atrophy of the body of the pancreas and dilatation of the main pancreatic duct. No active inflammatory changes. Spleen: Normal in size without focal abnormality. Adrenals/Urinary Tract: The adrenal glands unremarkable. Small bilateral renal cysts. There is no hydronephrosis on either side. There is symmetric enhancement and excretion of contrast by both kidneys. The visualized ureters and urinary bladder appear unremarkable. Stomach/Bowel: Severe colonic diverticulosis without active inflammatory changes. There is no bowel obstruction active inflammation. The appendix is normal. Vascular/Lymphatic: Moderate aortoiliac atherosclerotic disease. The IVC is unremarkable. No portal venous gas. Retroperitoneal adenopathy measures 13 mm to the left of the aorta, 17 mm in the periportal region, and 19 mm in the gastrohepatic vision. Reproductive: Hysterectomy.  No adnexal masses. Other: Small fat containing umbilical and supraumbilical hernia. Musculoskeletal: Degenerative changes of the spine. No acute osseous pathology. Review of the MIP images confirms the above findings. IMPRESSION: 1. No CT evidence of central pulmonary artery embolus. 2. Scattered clusters of ground-glass nodular densities throughout the lungs may represent atypical infection or metastatic disease. 3. Ill-defined soft tissue mass in the region of the head of the pancreas with atrophy of the body of the pancreas and dilatation of the main pancreatic duct. Findings are concerning for pancreatic malignancy. Further evaluation with MRI with and without contrast recommended. 4. Retroperitoneal adenopathy. 5. Cirrhosis. 6. Severe  colonic diverticulosis without active inflammatory changes. No bowel obstruction. Normal appendix. 7.  Aortic Atherosclerosis (ICD10-I70.0). Electronically Signed   By: Anner Crete M.D.   On: 01/14/2023 00:04    PERFORMANCE STATUS (ECOG) : {CHL ONC ECOG [id number]  Review of Systems Unless otherwise noted, a complete review of systems is negative.  Physical Exam General: NAD Cardiovascular: regular rate and rhythm Pulmonary: clear ant fields Abdomen: soft, nontender, + bowel sounds GU: no suprapubic tenderness Extremities: no edema, no joint deformities Skin: no rashes Neurological:  IMPRESSION: *** I introduced myself, Borna Wessinger RN, and Palliative's role in collaboration with the oncology team. Concept of Palliative Care was introduced as specialized medical care for people and their families living with serious illness.  It focuses on providing relief from the symptoms and stress of a serious illness.  The goal is to improve quality of life for both the patient and the family. Values and goals of care important to patient and family were attempted to be elicited.    We discussed *** current illness and  what it means in the larger context of Her on-going co-morbidities. Natural disease trajectory and expectations were discussed.  I discussed the importance of continued conversation with family and their medical providers regarding overall plan of care and treatment options, ensuring decisions are within the context of the patients values and GOCs.  PLAN: Established therapeutic relationship. Education provided on palliative's role in collaboration with their Oncology/Radiation team. I will plan to see patient back in 2-4 weeks in collaboration to other oncology appointments.    Patient expressed understanding and was in agreement with this plan. She also understands that She can call the clinic at any time with any questions, concerns, or complaints.   Thank you for your  referral and allowing Palliative to assist in Mrs. [name] Duclos's care.   Number and complexity of problems addressed: ***HIGH - 1 or more chronic illnesses with SEVERE exacerbation, progression, or side effects of treatment - advanced cancer, pain. Any controlled substances utilized were prescribed in the context of palliative care.  Time Total: ***  Visit consisted of counseling and education dealing with the complex and emotionally intense issues of symptom management and palliative care in the setting of serious and potentially life-threatening illness.Greater than 50%  of this time was spent counseling and coordinating care related to the above assessment and plan.  Signed by: Alda Lea, AGPCNP-BC Palliative Medicine Team/Tennille Steward

## 2023-02-06 NOTE — H&P (Signed)
History and Physical  Victoria [name] [id number] DOB: August 23, 1941 DOA: 02/06/2023  PCP: Elby Showers, MD   Patient coming from: Home   Chief Complaint: Hypoxia   HPI: Victoria [name] is a 82 y.o. female with medical history significant for tree of DVT and PE on Eliquis, insulin-dependent type 2 diabetes, pancreatic cancer and recent hospital stay for multifocal community-acquired pneumonia who is being admitted to the hospital with recurrent hypoxic respiratory failure and concern for healthcare acquired pneumonia.  Patient was discharged from this hospital on 2/13 after a stay during which she was treated with empiric IV azithromycin and Rocephin for 5 days.  Patient improved, was stable on room air and discharged home in stable condition yesterday.  Of note, the day prior to discharge from the hospital patient had some hypoxic respiratory failure was seen by pulmonary who did not recommend any changes in management.  She has actually been feeling pretty well, denies any chest pain, fevers, nausea, vomiting.  She came to [facility] today for palliative care outpatient consultation, and chest port placement with radiology.  However she was noted to be saturating in the 80s on room air, and sent to the emergency department for evaluation.  ED Course: Patient was evaluated in the emergency department, he had reportedly been saturating in the 80s on room air in the outpatient clinic.  Was started on 2 L nasal cannula oxygen now saturating 99%.  Lab work reveals new leukocytosis of 13,000, hemoglobin stable at 8.6 platelets 253.  Creatinine up from normal baseline to 1.38.  Patient was given empiric IV Zosyn for suspected healthcare acquired pneumonia, and hospitalist contacted for admission.  Review of Systems: Please see HPI for pertinent positives and negatives. A complete 10 system review of systems are otherwise negative.  Past Medical History:  Diagnosis Date   Abnormal finding on Pap  smear    Allergy    SEASONAL   Anxiety    Breast cancer ([place]) 2000   right breast lumptectomy, radiation done   Cirrhosis ([place]) 11/2012   resolved per pt   Diabetes mellitus type 2, controlled ([place])    Hepatitis C    took tx for    Hypertension    Obesity    Thrombocytopenia ([place]) 11/2012   Past Surgical History:  Procedure Laterality Date   ANTERIOR AND POSTERIOR REPAIR N/A 02/28/2021   Procedure: ANTERIOR (CYSTOCELE) AND POSTERIOR REPAIR (RECTOCELE);  Surgeon: Cheri Fowler, MD;  Location: [facility];  Service: Gynecology;  Laterality: N/A;   BIOPSY  01/09/2023   Procedure: BIOPSY;  Surgeon: Irving Copas., MD;  Location: [facility];  Service: Gastroenterology;;   BREAST BIOPSY Left 09/09/2016   BREAST LUMPECTOMY  right   BREAST LUMPECTOMY     BREAST REDUCTION SURGERY  2005   CATARACT EXTRACTION, BILATERAL  2014   CHOLECYSTECTOMY N/A 03/30/2013   Procedure: LAPAROSCOPIC CHOLECYSTECTOMY WITH INTRAOPERATIVE CHOLANGIOGRAM;  Surgeon: Shann Medal, MD;  Location: [facility];  Service: General;  Laterality: N/A;   COLONOSCOPY  2021   CYSTOSCOPY N/A 02/28/2021   Procedure: Consuela Mimes;  Surgeon: Cheri Fowler, MD;  Location: [place];  Service: Gynecology;  Laterality: N/A;   CYSTOSCOPY W/ RETROGRADES Left 02/28/2021   Procedure: CYSTOSCOPY WITH RETROGRADE PYELOGRAM;  Surgeon: Cheri Fowler, MD;  Location: [place];  Service: Gynecology;  Laterality: Left;   ESOPHAGOGASTRODUODENOSCOPY (EGD) WITH PROPOFOL N/A 11/25/2022   Procedure: ESOPHAGOGASTRODUODENOSCOPY (EGD) WITH PROPOFOL;  Surgeon: Carol Ada, MD;  Location:  [facility];  Service: Gastroenterology;  Laterality: N/A;   ESOPHAGOGASTRODUODENOSCOPY (EGD) WITH PROPOFOL N/A 01/09/2023   Procedure: ESOPHAGOGASTRODUODENOSCOPY (EGD) WITH PROPOFOL;  Surgeon: Rush Landmark Telford Nab., MD;  Location: [facility];  Service: Gastroenterology;  Laterality: N/A;   EUS   01/28/2013   Procedure: UPPER ENDOSCOPIC ULTRASOUND (EUS) LINEAR;  Surgeon: Milus Banister, MD;  Location: [facility];  Service: Endoscopy;  Laterality: N/A;  torian korson [phone]   EUS Left 11/25/2022   Procedure: UPPER ENDOSCOPIC ULTRASOUND (EUS) LINEAR;  Surgeon: Carol Ada, MD;  Location: [place];  Service: Gastroenterology;  Laterality: Left;   EUS N/A 01/09/2023   Procedure: UPPER ENDOSCOPIC ULTRASOUND (EUS) LINEAR;  Surgeon: Irving Copas., MD;  [facility];  Service: Gastroenterology;  Laterality: N/A;   EXCISION OF SKIN TAG Right 03/30/2013   Procedure: EXCISION OF SKIN TAG;  Surgeon: Shann Medal, MD;  [facility]: [facility];  Service: General;  Laterality: Right;   FINE NEEDLE ASPIRATION  11/25/2022   Procedure: FINE NEEDLE ASPIRATION (FNA) LINEAR;  Surgeon: Carol Ada, MD;  Location: [place];  Service: Gastroenterology;;   FINE NEEDLE ASPIRATION N/A 01/09/2023   Procedure: FINE NEEDLE ASPIRATION (FNA) LINEAR;  Surgeon: Irving Copas., MD;  Location: [facility];  Service: Gastroenterology;  Laterality: N/A;   LIVER BIOPSY  03/30/2013   Procedure: LIVER BIOPSY;  Surgeon: Shann Medal, MD;  Location: [facility];  Service: General;;   POLYPECTOMY     REDUCTION MAMMAPLASTY     THYROIDECTOMY, PARTIAL  1972   left   VAGINAL HYSTERECTOMY N/A 02/28/2021   Procedure: HYSTERECTOMY VAGINAL WITH UNILATERAL SALPINGECTOMY;  Surgeon: Cheri Fowler, MD;  Location: [place];  Service: Gynecology;  Laterality: N/A;   Social History:  reports that she has never smoked. She has never used smokeless tobacco. She reports that she does not drink alcohol and does not use drugs.  Allergies  Allergen Reactions   Lisinopril Cough   Family History  Problem Relation Age of Onset   Diabetes Mother    Stroke Mother    Colon cancer Sister 62   Colon polyps Neg Hx    Esophageal cancer Neg Hx    Rectal cancer Neg Hx    Stomach cancer  Neg Hx    Pancreatic cancer Neg Hx    Prostate cancer Neg Hx    Breast cancer Neg Hx     Prior to Admission medications   Medication Sig Start Date End Date Taking? Authorizing Provider  albuterol (VENTOLIN HFA) 108 (90 Base) MCG/ACT inhaler Inhale 2 puffs into the lungs every 6 (six) hours as needed for wheezing or shortness of breath. 08/15/22   Baxley, Cresenciano Lick, MD  ALPRAZolam (XANAX) 0.5 MG tablet TAKE 1 TABLET(0.5 MG) BY MOUTH TWICE DAILY AS NEEDED FOR ANXIETY Patient taking differently: Take 0.5 mg by mouth See admin instructions. Take 0.5 mg by mouth in the morning and evening 08/12/22   Elby Showers, MD  apixaban (ELIQUIS) 5 MG TABS tablet Take 1 tablet (5 mg total) by mouth 2 (two) times daily. 01/11/23   Mansouraty, Telford Nab., MD  B Complex-C (B-COMPLEX WITH VITAMIN C) tablet Take 1 tablet by mouth daily. 01/20/23   Lavina Hamman, MD  benzonatate (TESSALON PERLES) 100 MG capsule Take 1 capsule (100 mg total) by mouth 3 (three) times daily as needed for cough. 01/20/23 01/20/24  Lavina Hamman, MD  Blood Glucose Monitoring Suppl (TRUE METRIX METER) w/Device KIT USE AS DIRECTED 07/30/21  Elby Showers, MD  cetirizine (ZYRTEC) 10 MG tablet Take 10 mg by mouth daily as needed (allergies).    [provider]  chlorpheniramine-HYDROcodone (TUSSIONEX) 10-8 MG/5ML Take 5 mLs by mouth every 12 (twelve) hours as needed for cough. 01/20/23   Lavina Hamman, MD  cholecalciferol (VITAMIN D3) 25 MCG (1000 UNIT) tablet Take 1,000 Units by mouth in the morning.    [provider]  ferrous sulfate 325 (65 FE) MG tablet Take 1 tablet (325 mg total) by mouth daily. 01/20/23 01/20/24  Lavina Hamman, MD  furosemide (LASIX) 20 MG tablet Take 1 tablet (20 mg total) by mouth daily. 02/04/23 02/04/24  Raiford Noble Latif, DO  glucose blood (TRUE METRIX BLOOD GLUCOSE TEST) test strip TEST BLOOD SUGAR TWICE DAILY 10/10/22   Elby Showers, MD  guaiFENesin (MUCINEX) 600 MG 12 hr tablet Take 1 tablet  (600 mg total) by mouth 2 (two) times daily. Patient not taking: Reported on 01/24/2023 01/20/23 01/20/24  Lavina Hamman, MD  HYDROcodone-acetaminophen (NORCO/VICODIN) 5-325 MG tablet Take 1 tablet by mouth every 6 (six) hours as needed. Patient taking differently: Take 1 tablet by mouth every 6 (six) hours as needed for moderate pain or severe pain. 12/27/22   Dorie Rank, MD  Lancets (FREESTYLE) lancets CHECK GLUCOSE TWICE A DAY 01/26/20   Elby Showers, MD  loperamide (IMODIUM) 2 MG capsule Take 2-4 mg by mouth 3 (three) times daily as needed for diarrhea or loose stools.    [provider]  losartan (COZAAR) 100 MG tablet Take 1 tablet (100 mg total) by mouth daily. 02/05/23   Raiford Noble Latif, DO  morphine (MS CONTIN) 15 MG 12 hr tablet Take 1 tablet (15 mg total) by mouth every 12 (twelve) hours. 02/02/23   Pickenpack-Cousar, Carlena Sax, NP  Multiple Vitamin (MULTIVITAMIN WITH MINERALS) TABS tablet Take 1 tablet by mouth in the morning.    [provider]  ondansetron (ZOFRAN) 4 MG tablet Take 1 tablet (4 mg total) by mouth every 6 (six) hours as needed for nausea. 02/04/23   Raiford Noble Latif, DO  oxyCODONE (OXY IR/ROXICODONE) 5 MG immediate release tablet Take 1-2 tablets (5-10 mg total) by mouth every 4 (four) hours as needed for moderate pain, severe pain or breakthrough pain. 02/02/23   Pickenpack-Cousar, Carlena Sax, NP  oxymetazoline (AFRIN) 0.05 % nasal spray Place 2 sprays into both nostrils 2 (two) times daily as needed for congestion. 01/20/23 01/20/24  Lavina Hamman, MD  pantoprazole (PROTONIX) 40 MG tablet Take 1 tablet (40 mg total) by mouth daily. Patient taking differently: Take 40 mg by mouth daily before breakfast. 12/25/22   Baxley, Cresenciano Lick, MD  polyethylene glycol (MIRALAX / GLYCOLAX) 17 g packet Take 17 g by mouth daily. 02/05/23   Sheikh, Omair Latif, DO  senna-docusate (SENOKOT-S) 8.6-50 MG tablet Take 1 tablet by mouth at bedtime. 02/04/23   Kerney Elbe, DO    Physical Exam: BP 116/74   Pulse 97   Temp 97.7 F (36.5 C) (Oral)   Resp 10   Ht 5' 8"$  (1.727 m)   Wt 85.5 kg   SpO2 95%   BMI 28.66 kg/m  General:  Alert, oriented, calm, in no acute distress, nontoxic in appearance and resting comfortably on 2 L nasal cannula Eyes: EOMI, clear conjuctivae, white sclerea Neck: supple, no masses, trachea mildline  Cardiovascular: RRR, no murmurs or rubs, no peripheral edema  Respiratory: clear to auscultation bilaterally, no wheezes, no  crackles  Abdomen: soft, nontender, nondistended, normal bowel tones heard  Skin: dry, no rashes  Musculoskeletal: no joint effusions, normal range of motion  Psychiatric: appropriate affect, normal speech  Neurologic: extraocular muscles intact, clear speech, moving all extremities with intact sensorium          Labs on Admission:  Basic Metabolic Panel: Recent Labs  Lab 01/31/23 0957 02/01/23 0602 02/02/23 0527 02/03/23 0512 02/04/23 0536 02/06/23 1011  NA 135 135 138 137 134* 137  K 3.9 3.9 4.1 4.5 3.9 3.9  CL 96* 95* 99 98 96* 97*  CO2 29 29 29 29 26 25  $ GLUCOSE 245* 194* 166* 155* 186* 235*  BUN 30* 30* 26* 20 19 24*  CREATININE 1.06* 1.02* 0.87 0.93 0.89 1.38*  CALCIUM 8.9 8.7* 8.8* 8.9 8.3* 8.4*  MG 2.0 2.0 2.0 2.3 2.0  --   PHOS 4.0 4.6 3.9 4.1 3.6  --    Liver Function Tests: Recent Labs  Lab 02/01/23 0602 02/02/23 0527 02/03/23 0512 02/04/23 0536 02/06/23 1011  AST 83* 89* 95* 85* 90*  ALT 57* 59* 66* 65* 61*  ALKPHOS 207* 222* 245* 255* 243*  BILITOT 1.1 1.0 1.3* 1.1 1.3*  PROT 7.0 6.4* 7.7 6.8 7.0  ALBUMIN 2.8* 2.7* 3.0* 2.7* 2.6*   No results for input(s): "LIPASE", "AMYLASE" in the last 168 hours. No results for input(s): "AMMONIA" in the last 168 hours. CBC: Recent Labs  Lab 02/01/23 0602 02/02/23 0527 02/03/23 0512 02/04/23 0536 02/06/23 1011  WBC 8.2 7.6 9.6 9.0 12.7*  NEUTROABS 6.2 5.8 6.0 6.8 11.0*  HGB 8.9* 8.6* 9.4* 8.7* 8.6*  HCT 28.7* 27.4* 30.2* 28.0*  28.5*  MCV 89.4 89.8 90.7 89.7 91.6  PLT 214 196 231 201 253   Cardiac Enzymes: No results for input(s): "CKTOTAL", "CKMB", "CKMBINDEX", "TROPONINI" in the last 168 hours.  BNP (last 3 results) Recent Labs    01/24/23 1045 02/06/23 1011  BNP 64.5 74.4   ProBNP (last 3 results) No results for input(s): "PROBNP" in the last 8760 hours.  CBG: Recent Labs  Lab 02/03/23 1133 02/03/23 1625 02/03/23 2149 02/04/23 0814 02/04/23 1113  GLUCAP 181* 128* 203* 165* 229*   Radiological Exams on Admission: CT Angio Chest PE W and/or Wo Contrast  Result Date: 02/06/2023 CLINICAL DATA:  SOB EXAM: CT ANGIOGRAPHY CHEST WITH CONTRAST TECHNIQUE: Multidetector CT imaging of the chest was performed using the standard protocol during bolus administration of intravenous contrast. Multiplanar CT image reconstructions and MIPs were obtained to evaluate the vascular anatomy. RADIATION DOSE REDUCTION: This exam was performed according to the departmental dose-optimization program which includes automated exposure control, adjustment of the mA and/or kV according to patient size and/or use of iterative reconstruction technique. CONTRAST:  75 mL OMNIPAQUE IOHEXOL 350 MG/ML SOLN COMPARISON:  01/13/2023 FINDINGS: Cardiovascular: Satisfactory opacification of the pulmonary arteries to the segmental level. No evidence of pulmonary embolism. Normal heart size. No pericardial effusion. Atheromatous changes of the aorta and coronary arteries. No aortic aneurysm or dissection identified. Mediastinum/Nodes: Right paratracheal node measures 1 cm. Left prevascular node measures 1 cm. Superior mediastinal structures unremarkable. Lungs/Pleura: There is extensive diffuse bilateral nodular interstitial process with innumerable lesions. This is consistent with an atypical infection or metastatic disease and there has been no appreciable interval change compared to the prior study from January 2024. Upper Abdomen: Ill-defined mass  pancreatic head not fully imaged on this study. Musculoskeletal: Thoracic degenerative changes noted. No osteolytic or osteoblastic lesions identified. Review of the  MIP images confirms the above findings. IMPRESSION: 1. No PE, aneurysm or dissection. 2. Extensive nodular interstitial process throughout the lungs consistent with atypical infection versus metastatic disease without definite interval change. 3. Nonspecific mildly prominent mediastinal nodes. 4. Pancreatic head mass that was not fully imaged on this study. Electronically Signed   By: Sammie Bench M.D.   On: 02/06/2023 12:33   DG Chest Port 1 View  Result Date: 02/06/2023 CLINICAL DATA:  Dyspnea EXAM: PORTABLE CHEST 1 VIEW COMPARISON:  02/04/2023 FINDINGS: The heart size and mediastinal contours are within normal limits. Diffuse bilateral interstitial pulmonary opacity. The visualized skeletal structures are unremarkable. IMPRESSION: Diffuse bilateral interstitial pulmonary opacity, consistent with edema or atypical/viral infection. No new or focal airspace opacity. Electronically Signed   By: Delanna Ahmadi M.D.   On: 02/06/2023 10:20    Assessment/Plan Principal Problem:   HCAP (healthcare-associated pneumonia) -this is suspected due to new leukocytosis, new hypoxic respiratory failure, and diffuse bilateral interstitial pulmonary opacity.  Patient is not meeting sepsis criteria. -Inpatient admission -Supplemental oxygen -Empiric IV cefepime and vancomycin  Active Problems:   DM2 (diabetes mellitus, type 2) (HCC) -diabetic diet when eating, with sliding scale insulin   Hypertension -hold home antihypertensives due to AKI   Hepatitis C, chronic (HCC)   Pancreatic cancer ([place]) -patient is scheduled for port placement and initiation of palliative chemotherapy.  Continue MS Contin and oxycodone as needed for her cancer pain   History of DVT (deep vein thrombosis) -continue home Eliquis   Palliative care patient -she was seen by  palliative care this morning in the ER, she remains full code and requests full aggressive treatment   AKI (acute kidney injury) ([place]) -with elevated creatinine, possibly due to reduced oral intake in the setting of continued nephrotoxic medication use.  Will hold patient's home losartan and Lasix, hydrate gently overnight, and recheck renal function in the morning.  DVT prophylaxis: Continue home Eliquis  Code Status: Full   Family Communication: None present, patient is AAOx4.   Consults called: None   Admission status: Inpatient   Time spent: 42 minutes  Hila Bolding Neva Seat MD [facility] Pager [phone]  If 7PM-7AM, please contact night-coverage [email or web address] Password [facility]  02/06/2023, 5:22 PM

## 2023-02-06 NOTE — ED Provider Notes (Signed)
3:40 PM Care assumed from Dr. Jeanell Sparrow.  At time of transfer of care, patient is awaiting evaluation and disposition by palliative care to see if she will be able to go home with oxygen versus needing to be readmitted given this new hypoxia tachycardia and shortness of breath while visiting the cancer center today.  She was recently discharged for suspected multifocal pneumonia.  CT scan today does not show evidence of interval change nor does show evidence of pulm embolism but does have a rising white count.  Plan of care is to speak to palliative and either admit for new hypoxia and oxygen requirement and IV antibiotics versus discharge home to continue antibiotics on oxygen.  Patient will receive IV antibiotics here.  4:33 PM Palliative care reached out to say that as of now she is still full-court press and full code.  We will admit her for further management of this. Pneumonia.  Clinical Impression: 1. Pneumonia due to infectious organism, unspecified laterality, unspecified part of lung   2. Metastatic malignant neoplasm, unspecified site [facility])     Disposition: Admit  This note was prepared with assistance of Dragon voice recognition software. Occasional wrong-word or sound-a-like substitutions may have occurred due to the inherent limitations of voice recognition software.     Lendell Gallick, Gwenyth Allegra, MD 02/06/23 [phone]

## 2023-02-07 ENCOUNTER — Inpatient Hospital Stay (HOSPITAL_COMMUNITY)
Admit: 2023-02-07 | Discharge: 2023-02-07 | Disposition: A | Discharge status: Home / Self Care | Payer: Medicare HMO | Attending: Hematology | Admitting: Hematology

## 2023-02-07 ENCOUNTER — Ambulatory Visit (HOSPITAL_COMMUNITY): Payer: Medicare HMO

## 2023-02-07 DIAGNOSIS — J189 Pneumonia, unspecified organism: Secondary | ICD-10-CM | POA: Diagnosis not present

## 2023-02-07 DIAGNOSIS — N179 Acute kidney failure, unspecified: Secondary | ICD-10-CM

## 2023-02-07 DIAGNOSIS — C799 Secondary malignant neoplasm of unspecified site: Secondary | ICD-10-CM

## 2023-02-07 DIAGNOSIS — I10 Essential (primary) hypertension: Secondary | ICD-10-CM

## 2023-02-07 DIAGNOSIS — B182 Chronic viral hepatitis C: Secondary | ICD-10-CM | POA: Diagnosis not present

## 2023-02-07 DIAGNOSIS — C259 Malignant neoplasm of pancreas, unspecified: Secondary | ICD-10-CM

## 2023-02-07 HISTORY — PX: IR IMAGING GUIDED PORT INSERTION: IMG5740

## 2023-02-07 LAB — CBC
HCT: 24.3 % — ABNORMAL LOW (ref 36.0–46.0)
Hemoglobin: 7.2 g/dL — ABNORMAL LOW (ref 12.0–15.0)
MCH: 27.5 pg (ref 26.0–34.0)
MCHC: 29.6 g/dL — ABNORMAL LOW (ref 30.0–36.0)
MCV: 92.7 fL (ref 80.0–100.0)
Platelets: 193 10*3/uL (ref 150–400)
RBC: 2.62 MIL/uL — ABNORMAL LOW (ref 3.87–5.11)
RDW: 17.8 % — ABNORMAL HIGH (ref 11.5–15.5)
WBC: 8.5 10*3/uL (ref 4.0–10.5)
nRBC: 0 % (ref 0.0–0.2)

## 2023-02-07 LAB — GLUCOSE, CAPILLARY
Glucose-Capillary: 142 mg/dL — ABNORMAL HIGH (ref 70–99)
Glucose-Capillary: 138 mg/dL — ABNORMAL HIGH (ref 70–99)
Glucose-Capillary: 181 mg/dL — ABNORMAL HIGH (ref 70–99)
Glucose-Capillary: 117 mg/dL — ABNORMAL HIGH (ref 70–99)
Glucose-Capillary: 152 mg/dL — ABNORMAL HIGH (ref 70–99)

## 2023-02-07 LAB — BASIC METABOLIC PANEL
Anion gap: 12 (ref 5–15)
BUN: 25 mg/dL — ABNORMAL HIGH (ref 8–23)
CO2: 23 mmol/L (ref 22–32)
Calcium: 8 mg/dL — ABNORMAL LOW (ref 8.9–10.3)
Chloride: 101 mmol/L (ref 98–111)
Creatinine, Ser: 1.24 mg/dL — ABNORMAL HIGH (ref 0.44–1.00)
GFR, Estimated: 44 mL/min — ABNORMAL LOW (ref >60)
Glucose, Bld: 136 mg/dL — ABNORMAL HIGH (ref 70–99)
Potassium: 3.7 mmol/L (ref 3.5–5.1)
Sodium: 136 mmol/L (ref 135–145)

## 2023-02-07 LAB — STREP PNEUMONIAE URINARY ANTIGEN: Strep Pneumo Urinary Antigen: NEGATIVE

## 2023-02-07 LAB — SURGICAL PCR SCREEN
MRSA, PCR: NEGATIVE
Staphylococcus aureus: NEGATIVE

## 2023-02-07 MED ORDER — FENTANYL CITRATE (PF) 100 MCG/2ML IJ SOLN
INTRAMUSCULAR | Status: AC | PRN
  Administered 2023-02-07: 50 ug via INTRAVENOUS

## 2023-02-07 MED ORDER — LIDOCAINE-EPINEPHRINE 1 %-1:100000 IJ SOLN
INTRAMUSCULAR | Status: AC
  Administered 2023-02-07: 18 mL via INTRADERMAL
  Filled 2023-02-07: qty 1

## 2023-02-07 MED ORDER — CHLORHEXIDINE GLUCONATE CLOTH 2 % EX PADS
6.0000 | MEDICATED_PAD | Freq: Every day | CUTANEOUS | Status: DC
  Administered 2023-02-07 – 2023-02-12 (×5): 6 via TOPICAL

## 2023-02-07 MED ORDER — FENTANYL CITRATE (PF) 100 MCG/2ML IJ SOLN
INTRAMUSCULAR | Status: AC
  Filled 2023-02-07: qty 2

## 2023-02-07 MED ORDER — MIDAZOLAM HCL 2 MG/2ML IJ SOLN
INTRAMUSCULAR | Status: AC | PRN
  Administered 2023-02-07: 1.5 mg via INTRAVENOUS

## 2023-02-07 MED ORDER — MIDAZOLAM HCL 2 MG/2ML IJ SOLN
INTRAMUSCULAR | Status: AC
  Filled 2023-02-07: qty 2

## 2023-02-07 MED ORDER — HYDROMORPHONE HCL 1 MG/ML IJ SOLN
0.5000 mg | INTRAMUSCULAR | Status: DC | PRN
  Administered 2023-02-10: 0.5 mg via INTRAVENOUS
  Filled 2023-02-07 (×2): qty 0.5

## 2023-02-07 NOTE — Procedures (Signed)
Interventional Radiology Procedure Note ° °Procedure: Single Lumen Power Port Placement   ° °Access:  Right internal jugular vein ° °Findings: Catheter tip positioned at cavoatrial junction. Port is ready for immediate use.  ° °Complications: None ° °EBL: < 10 mL ° °Recommendations:  °- Ok to shower in 24 hours °- Do not submerge for 7 days °- Routine line care  ° ° °Rhea Thrun, MD ° ° ° °

## 2023-02-07 NOTE — Progress Notes (Signed)
PROGRESS NOTE    Victoria [name]  [id number] DOB: Jul 28, 1941 DOA: 02/06/2023 PCP: Elby Showers, MD    Chief Complaint  Patient presents with   Weakness    Brief Narrative:  HPI per Dr. Lonni Fix is a 82 y.o. female with medical history significant for tree of DVT and PE on Eliquis, insulin-dependent type 2 diabetes, pancreatic cancer and recent hospital stay for multifocal community-acquired pneumonia who is being admitted to the hospital with recurrent hypoxic respiratory failure and concern for healthcare acquired pneumonia.  Patient was discharged from this hospital on 2/13 after a stay during which she was treated with empiric IV azithromycin and Rocephin for 5 days.  Patient improved, was stable on room air and discharged home in stable condition yesterday.  Of note, the day prior to discharge from the hospital patient had some hypoxic respiratory failure was seen by pulmonary who did not recommend any changes in management.  She has actually been feeling pretty well, denies any chest pain, fevers, nausea, vomiting.  She came to [facility] today for palliative care outpatient consultation, and chest port placement with radiology.  However she was noted to be saturating in the 80s on room air, and sent to the emergency department for evaluation.   ED Course: Patient was evaluated in the emergency department, he had reportedly been saturating in the 80s on room air in the outpatient clinic.  Was started on 2 L nasal cannula oxygen now saturating 99%.  Lab work reveals new leukocytosis of 13,000, hemoglobin stable at 8.6 platelets 253.  Creatinine up from normal baseline to 1.38.  Patient was given empiric IV Zosyn for suspected healthcare acquired pneumonia, and hospitalist contacted for admission.  Assessment & Plan:   Principal Problem:   HCAP (healthcare-associated pneumonia) Active Problems:   DM2 (diabetes mellitus, type 2) ([place])   Hypertension    Hepatitis C, chronic (HCC)   Pancreatic cancer ([place])   History of DVT (deep vein thrombosis)   Palliative care patient   AKI (acute kidney injury) ([place])  #1 hypoxia/HCAP -Patient admitted with hypoxia noted to have sats of 80% on room air in the outpatient clinic setting and started on 2 L nasal cannula. -CBC done on admission with a leukocytosis white count of 13,000, hemoglobin noted to be stable at 8.6, chest x-ray done with diffuse bilateral interstitial pulmonary opacity, consistent with edema or atypical/viral infection.  No new or focal airspace opacity. -CT angiogram chest no PE, aneurysm or dissection.  Extensive nodular interstitial process throughout the lungs consistent with atypical infection versus metastatic disease with an definite interval change.  Nonspecific mildly prominent mediastinal nodes.  Pancreatic head mass that was not fully imaged on this study. -Patient's hypoxia likely secondary to metastatic disease from pancreatic cancer. -Patient recently hospitalized and treated empirically with antibiotics for pneumonia on 2 recent separate occasions.  Patient also treated for volume overload. -Patient afebrile.  Denies any significant productive cough. -Check a urine Legionella antigen, urine pneumococcus antigen. -MRSA PCR negative. -Discontinue IV vancomycin. -Continue IV cefepime. -Check ambulatory sats in the AM. -May need home O2 on discharge.  2.  Anemia of chronic disease -Patient with no overt bleeding, hemoglobin currently at 7.2 today from 8.6 on admission. -Anemia panel done 01/24/2023 with iron level of 87, TIBC of 395. -Follow H&H. -Transfusion threshold hemoglobin < 7.  3.  Newly diagnosed metastatic pancreatic cancer -Patient being followed by oncology, oncology assessed patient and reviewed poor prognosis from oncology standpoint  with patient and recommending chemo option versus palliative care alone. -Patient noted to strongly want to try chemotherapy  and not ready for hospice at this time, patient full code. -Patient seen by palliative care and pain regiment placed. -Continue MS Contin twice daily, oxycodone as needed for breakthrough pain, placed on IV Dilaudid every 6 hours as needed as well as Xanax 0.5 mg twice daily. -Patient for Port-A-Cath placement today. -Per oncology. -Will need outpatient follow-up with palliative care.  4.  History of DVT -Continue home regimen Eliquis.  5.  Hypertension -BP noted to be soft/borderline. -Continue to hold home regimen antihypertensive medications.  6.  Diabetes mellitus type 2 -Hemoglobin A1c 6.7  (12/06/2022) -CBG 142 this morning. -SSI.  7.  History of chronic hep C -Patient noted to have been treated. -Outpatient follow-up.  8.  Anxiety -Continue Xanax twice daily.  9.  AKI -Improving with hydration.    DVT prophylaxis: Eliquis Code Status: Full Family Communication: Updated patient.  No family at bedside. Disposition: Likely home when clinically improved.  Status is: Inpatient Remains inpatient appropriate because: Severity of illness   Consultants:  Interventional radiology: Dr. Serafina Royals 02/07/2023 Palliative care: Jobe Gibbon, NP 02/06/2023 Oncology: Dr. Burr Medico 02/06/2023   Procedures:  CT angiogram chest 02/06/2023 Chest x-ray 02/06/2023   Antimicrobials:  IV cefepime 02/06/2023>>>> IV Zosyn 215 2024 x 1 dose IV vancomycin 02/06/2023>>>> 02/07/2023   Subjective: Laying in bed.  Denies any significant shortness of breath.  Denies any chest pain.  No abdominal pain.  Wondering when Port-A-Cath will be placed.  Objective: Vitals:   02/06/23 2203 02/07/23 0129 02/07/23 0543 02/07/23 1008  BP: 109/77 127/82 125/79 125/73  Pulse: (!) 105 95 96 92  Resp:   19 16  Temp: 98.6 F (37 C) 98 F (36.7 C) 97.8 F (36.6 C) 98 F (36.7 C)  TempSrc: Oral Oral Oral   SpO2: 98% 99% 98% 92%  Weight:      Height:        Intake/Output Summary (Last 24  hours) at 02/07/2023 1252 Last data filed at 02/07/2023 1234 Gross per 24 hour  Intake 1419.68 ml  Output 750 ml  Net 669.68 ml   Filed Weights   02/06/23 0930  Weight: 85.5 kg    Examination:  General exam: Appears calm and comfortable  Respiratory system: Clear to auscultation.  No wheezes, no crackles, no rhonchi.  Respiratory effort normal. Cardiovascular system: S1 & S2 heard, RRR. No JVD, murmurs, rubs, gallops or clicks. No pedal edema. Gastrointestinal system: Abdomen is nondistended, soft and nontender. No organomegaly or masses felt. Normal bowel sounds heard. Central nervous system: Alert and oriented. No focal neurological deficits. Extremities: Symmetric 5 x 5 power. Skin: No rashes, lesions or ulcers Psychiatry: Judgement and insight appear normal. Mood & affect appropriate.     Data Reviewed: I have personally reviewed following labs and imaging studies  CBC: Recent Labs  Lab 02/01/23 0602 02/02/23 0527 02/03/23 0512 02/04/23 0536 02/06/23 1011 02/07/23 0507  WBC 8.2 7.6 9.6 9.0 12.7* 8.5  NEUTROABS 6.2 5.8 6.0 6.8 11.0*  --   HGB 8.9* 8.6* 9.4* 8.7* 8.6* 7.2*  HCT 28.7* 27.4* 30.2* 28.0* 28.5* 24.3*  MCV 89.4 89.8 90.7 89.7 91.6 92.7  PLT 214 196 231 201 253 0000000    Basic Metabolic Panel: Recent Labs  Lab 02/01/23 0602 02/02/23 0527 02/03/23 0512 02/04/23 0536 02/06/23 1011 02/07/23 0507  NA 135 138 137 134* 137 136  K 3.9 4.1 4.5 3.9  3.9 3.7  CL 95* 99 98 96* 97* 101  CO2 29 29 29 26 25 23  $ GLUCOSE 194* 166* 155* 186* 235* 136*  BUN 30* 26* 20 19 24* 25*  CREATININE 1.02* 0.87 0.93 0.89 1.38* 1.24*  CALCIUM 8.7* 8.8* 8.9 8.3* 8.4* 8.0*  MG 2.0 2.0 2.3 2.0  --   --   PHOS 4.6 3.9 4.1 3.6  --   --     GFR: Estimated Creatinine Clearance: 42.1 mL/min (A) (by C-G formula based on SCr of 1.24 mg/dL (H)).  Liver Function Tests: Recent Labs  Lab 02/01/23 0602 02/02/23 0527 02/03/23 0512 02/04/23 0536 02/06/23 1011  AST 83* 89* 95* 85*  90*  ALT 57* 59* 66* 65* 61*  ALKPHOS 207* 222* 245* 255* 243*  BILITOT 1.1 1.0 1.3* 1.1 1.3*  PROT 7.0 6.4* 7.7 6.8 7.0  ALBUMIN 2.8* 2.7* 3.0* 2.7* 2.6*    CBG: Recent Labs  Lab 02/04/23 0814 02/04/23 1113 02/06/23 2205 02/07/23 0735 02/07/23 1148  GLUCAP 165* 229* 160* 142* 138*     Recent Results (from the past 240 hour(s))  Blood culture (routine x 2)     Status: None (Preliminary result)   Collection Time: 02/06/23 10:12 AM   Specimen: BLOOD  Result Value Ref Range Status   Specimen Description   Final    BLOOD BLOOD LEFT ARM Performed at [facility], [place] [address], [place], [place] [address]    Special Requests   Final    BOTTLES DRAWN AEROBIC AND ANAEROBIC Blood Culture results may not be optimal due to an inadequate volume of blood received in culture bottles Performed at [facility] [address] [place], [place] [address]    Culture   Final    NO GROWTH < 24 HOURS Performed at [facility] [address] [place], [place] [address]    Report Status PENDING  Incomplete  Blood culture (routine x 2)     Status: None (Preliminary result)   Collection Time: 02/06/23 10:12 AM   Specimen: BLOOD  Result Value Ref Range Status   Specimen Description   Final    BLOOD LEFT ANTECUBITAL Performed at [facility] [address] [place], [place] [address]    Special Requests   Final    BOTTLES DRAWN AEROBIC AND ANAEROBIC Blood Culture results may not be optimal due to an inadequate volume of blood received in culture bottles Performed at [facility] [address], [place], [place] [address]    Culture   Final    NO GROWTH < 24 HOURS Performed at [facility] [address], [place], [place] [address]    Report Status PENDING  Incomplete  Surgical pcr screen     Status: None   Collection Time: 02/07/23  6:28 AM   Specimen: Nasal Mucosa; Nasal Swab  Result Value Ref  Range Status   MRSA, PCR NEGATIVE NEGATIVE Final   Staphylococcus aureus NEGATIVE NEGATIVE Final    Comment: (NOTE) The Xpert SA Assay (FDA approved for NASAL specimens in patients 43 years of age and older), is one component of a comprehensive surveillance program. It is not intended to diagnose infection nor to guide or monitor treatment. Performed at [facility], [place] [address], [place], [place] [address]          Radiology Studies: CT Angio Chest PE W and/or Wo Contrast  Result Date: 02/06/2023 CLINICAL DATA:  SOB EXAM: CT ANGIOGRAPHY CHEST WITH CONTRAST TECHNIQUE: Multidetector CT imaging of the chest was performed using the standard protocol during bolus administration of intravenous contrast. Multiplanar CT image reconstructions and MIPs were obtained to evaluate the vascular anatomy. RADIATION DOSE REDUCTION: This exam was performed according to the departmental dose-optimization program which includes automated exposure control, adjustment of the mA and/or kV according to patient size and/or use of iterative reconstruction technique. CONTRAST:  75 mL OMNIPAQUE IOHEXOL 350 MG/ML SOLN COMPARISON:  01/13/2023 FINDINGS: Cardiovascular: Satisfactory opacification of the pulmonary arteries to the segmental level. No evidence of pulmonary embolism. Normal heart size. No pericardial effusion. Atheromatous changes of the aorta and coronary arteries. No aortic aneurysm or dissection identified. Mediastinum/Nodes: Right paratracheal node measures 1 cm. Left prevascular node measures 1 cm. Superior mediastinal structures unremarkable. Lungs/Pleura: There is extensive diffuse bilateral nodular interstitial process with innumerable lesions. This is consistent with an atypical infection or metastatic disease and there has been no appreciable interval change compared to the prior study from January 2024. Upper Abdomen: Ill-defined mass pancreatic head not fully imaged on this study.  Musculoskeletal: Thoracic degenerative changes noted. No osteolytic or osteoblastic lesions identified. Review of the MIP images confirms the above findings. IMPRESSION: 1. No PE, aneurysm or dissection. 2. Extensive nodular interstitial process throughout the lungs consistent with atypical infection versus metastatic disease without definite interval change. 3. Nonspecific mildly prominent mediastinal nodes. 4. Pancreatic head mass that was not fully imaged on this study. Electronically Signed   By: Sammie Bench M.D.   On: 02/06/2023 12:33   DG Chest Port 1 View  Result Date: 02/06/2023 CLINICAL DATA:  Dyspnea EXAM: PORTABLE CHEST 1 VIEW COMPARISON:  02/04/2023 FINDINGS: The heart size and mediastinal contours are within normal limits. Diffuse bilateral interstitial pulmonary opacity. The visualized skeletal structures are unremarkable. IMPRESSION: Diffuse bilateral interstitial pulmonary opacity, consistent with edema or atypical/viral infection. No new or focal airspace opacity. Electronically Signed   By: Delanna Ahmadi M.D.   On: 02/06/2023 10:20        Scheduled Meds:  apixaban  5 mg Oral BID   ferrous sulfate  325 mg Oral Daily   insulin aspart  0-15 Units Subcutaneous TID WC   insulin aspart  0-5 Units Subcutaneous QHS   lidocaine-EPINEPHrine       loratadine  10 mg Oral Daily   morphine  15 mg Oral Q12H   pantoprazole  40 mg Oral QAC breakfast   polyethylene glycol  17 g Oral Daily   senna-docusate  1 tablet Oral QHS   Continuous Infusions:  sodium chloride 50 mL/hr at 02/06/23 2218   ceFEPime (MAXIPIME) IV 2 g (02/07/23 1005)   vancomycin       LOS: 1 day    Time spent: 35 minutes    Irine Seal, MD [facility]   To contact the attending provider between 7A-7P or the covering provider during after hours 7P-7A, please log into the web site [email or web address] and access using universal [place] password for that web site. If you do not have the password,  please call the hospital operator.  02/07/2023, 12:52 PM

## 2023-02-07 NOTE — Progress Notes (Signed)
Tech alerted nurse at shift change that patient was extremely agitated, complaining of shortness of breath (pt on 3L [place]) and lying completely naked in bed. NT had already gotten vitals on pt which were all normal. O2 sats in particular were 100% on 3L [place].   Pt was complaining of SOB, breathing rapidly, and notably agitated. Nurse sat pt upright in bed and encouraged deep, slow breathing. CBG obtained: 117. PRN xanax given. During administration, nurse noted a large amount of sputum in trash can. Pt has not had a cough or producing sputum at all today. Pt does have PRN cough medication, which night shift will administer.  Re-checked pt about 20 minutes after giving PRN xanax. Pt notably calmer and breathing normally. Night shift nurse thoroughly briefed and pt spent time speaking about this event with both day and night shift nurses present. Night nurse to continue care.

## 2023-02-07 NOTE — Consult Note (Signed)
Chief Complaint: Patient was seen in consultation today for image guided port a cath placement Chief Complaint  Patient presents with   Weakness     Referring Physician(s): Feng,Y  Supervising Physician: Ruthann Cancer  Patient Status: [facility] - In-pt  History of Present Illness: TRANICE STIFF is a 82 y.o. female with PMH sig for anxiety, remote right breast cancer with prior lumpectomy/radiation, cirrhosis, PE/DVT, hepatitis C, DM, HTN. She presents now with newly diagnosed metastatic pancreatic cancer/HCAP (readmitted to [facility] yesterday with hypoxia) . Request received from oncology for port a cath placement to assist with chemotherapy. She is currently afebrile, O2 sats 92%  3 liters N/C. WBC nl, hgb 7.2, plts nl, creat 1.24, PT 25.5/INR 2.4, blood cx neg to date. CT angio chest yesterday revealed:  1. No PE, aneurysm or dissection. 2. Extensive nodular interstitial process throughout the lungs consistent with atypical infection versus metastatic disease without definite interval change. 3. Nonspecific mildly prominent mediastinal nodes. 4. Pancreatic head mass that was not fully imaged on this study  She is on IV vancomycin, maxipime, eliquis   Past Medical History:  Diagnosis Date   Abnormal finding on Pap smear    Allergy    SEASONAL   Anxiety    Breast cancer ([place]) 2000   right breast lumptectomy, radiation done   Cirrhosis ([place]) 11/2012   resolved per pt   Diabetes mellitus type 2, controlled ([place])    Hepatitis C    took tx for    Hypertension    Obesity    Thrombocytopenia ([place]) 11/2012    Past Surgical History:  Procedure Laterality Date   ANTERIOR AND POSTERIOR REPAIR N/A 02/28/2021   Procedure: ANTERIOR (CYSTOCELE) AND POSTERIOR REPAIR (RECTOCELE);  Surgeon: Cheri Fowler, MD;  Location: [facility];  Service: Gynecology;  Laterality: N/A;   BIOPSY  01/09/2023   Procedure: BIOPSY;  Surgeon: Irving Copas., MD;  Location: [facility]  ENDOSCOPY;  Service: Gastroenterology;;   BREAST BIOPSY Left 09/09/2016   BREAST LUMPECTOMY  right   BREAST LUMPECTOMY     BREAST REDUCTION SURGERY  2005   CATARACT EXTRACTION, BILATERAL  2014   CHOLECYSTECTOMY N/A 03/30/2013   Procedure: LAPAROSCOPIC CHOLECYSTECTOMY WITH INTRAOPERATIVE CHOLANGIOGRAM;  Surgeon: Shann Medal, MD;  Location: [facility];  Service: General;  Laterality: N/A;   COLONOSCOPY  2021   CYSTOSCOPY N/A 02/28/2021   Procedure: Consuela Mimes;  Surgeon: Cheri Fowler, MD;  Location: [place];  Service: Gynecology;  Laterality: N/A;   CYSTOSCOPY W/ RETROGRADES Left 02/28/2021   Procedure: CYSTOSCOPY WITH RETROGRADE PYELOGRAM;  Surgeon: Cheri Fowler, MD;  Location: [place];  Service: Gynecology;  Laterality: Left;   ESOPHAGOGASTRODUODENOSCOPY (EGD) WITH PROPOFOL N/A 11/25/2022   Procedure: ESOPHAGOGASTRODUODENOSCOPY (EGD) WITH PROPOFOL;  Surgeon: Carol Ada, MD;  Location: [place];  Service: Gastroenterology;  Laterality: N/A;   ESOPHAGOGASTRODUODENOSCOPY (EGD) WITH PROPOFOL N/A 01/09/2023   Procedure: ESOPHAGOGASTRODUODENOSCOPY (EGD) WITH PROPOFOL;  Surgeon: Rush Landmark Telford Nab., MD;  [facility]: [facility];  Service: Gastroenterology;  Laterality: N/A;   EUS  01/28/2013   Procedure: UPPER ENDOSCOPIC ULTRASOUND (EUS) LINEAR;  Surgeon: Milus Banister, MD;  Location: [facility];  Service: Endoscopy;  Laterality: N/A;  catharina steeples [phone]   EUS Left 11/25/2022   Procedure: UPPER ENDOSCOPIC ULTRASOUND (EUS) LINEAR;  Surgeon: Carol Ada, MD;  Location: [place];  Service: Gastroenterology;  Laterality: Left;   EUS N/A 01/09/2023   Procedure: UPPER ENDOSCOPIC ULTRASOUND (EUS) LINEAR;  Surgeon: Justice Britain  Brooke Bonito., MD;  Location: [facility];  Service: Gastroenterology;  Laterality: N/A;   EXCISION OF SKIN TAG Right 03/30/2013   Procedure: EXCISION OF SKIN TAG;  Surgeon: Shann Medal, MD;  [facility];   Service: General;  Laterality: Right;   FINE NEEDLE ASPIRATION  11/25/2022   Procedure: FINE NEEDLE ASPIRATION (FNA) LINEAR;  Surgeon: Carol Ada, MD;  Location: [place];  Service: Gastroenterology;;   FINE NEEDLE ASPIRATION N/A 01/09/2023   Procedure: FINE NEEDLE ASPIRATION (FNA) LINEAR;  Surgeon: Irving Copas., MD;  Location: [facility];  Service: Gastroenterology;  Laterality: N/A;   LIVER BIOPSY  03/30/2013   Procedure: LIVER BIOPSY;  Surgeon: Shann Medal, MD;  [facility];  Service: General;;   POLYPECTOMY     REDUCTION MAMMAPLASTY     THYROIDECTOMY, PARTIAL  1972   left   VAGINAL HYSTERECTOMY N/A 02/28/2021   Procedure: HYSTERECTOMY VAGINAL WITH UNILATERAL SALPINGECTOMY;  Surgeon: Cheri Fowler, MD;  Location: [place];  Service: Gynecology;  Laterality: N/A;    Allergies: Lisinopril  Medications: Prior to Admission medications   Medication Sig Start Date End Date Taking? Authorizing Provider  ALPRAZolam (XANAX) 0.5 MG tablet TAKE 1 TABLET(0.5 MG) BY MOUTH TWICE DAILY AS NEEDED FOR ANXIETY Patient taking differently: Take 0.5 mg by mouth in the morning and at bedtime. 08/12/22  Yes Baxley, Cresenciano Lick, MD  apixaban (ELIQUIS) 5 MG TABS tablet Take 1 tablet (5 mg total) by mouth 2 (two) times daily. 01/11/23  Yes Mansouraty, Telford Nab., MD  benzonatate (TESSALON PERLES) 100 MG capsule Take 1 capsule (100 mg total) by mouth 3 (three) times daily as needed for cough. Patient taking differently: Take 100 mg by mouth as needed for cough. 01/20/23 01/20/24 Yes Lavina Hamman, MD  cholecalciferol (VITAMIN D3) 25 MCG (1000 UNIT) tablet Take 1,000 Units by mouth in the morning. When able to remember   Yes [provider]  ferrous sulfate 325 (65 FE) MG tablet Take 1 tablet (325 mg total) by mouth daily. Patient taking differently: Take 325 mg by mouth daily. When able to remember 01/20/23 01/20/24 Yes Lavina Hamman, MD  furosemide (LASIX) 20 MG  tablet Take 1 tablet (20 mg total) by mouth daily. 02/04/23 02/04/24 Yes Sheikh, Omair Latif, DO  HYDROcodone-acetaminophen (NORCO/VICODIN) 5-325 MG tablet Take 1 tablet by mouth every 6 (six) hours as needed. Patient taking differently: Take 1 tablet by mouth 2 (two) times daily as needed for moderate pain or severe pain. 12/27/22  Yes Dorie Rank, MD  metFORMIN (GLUCOPHAGE) 500 MG tablet Take 500 mg by mouth 2 (two) times daily with a meal.   Yes [provider]  morphine (MS CONTIN) 15 MG 12 hr tablet Take 1 tablet (15 mg total) by mouth every 12 (twelve) hours. 02/02/23  Yes Pickenpack-Cousar, Carlena Sax, NP  Multiple Vitamin (MULTIVITAMIN WITH MINERALS) TABS tablet Take 1 tablet by mouth in the morning and at bedtime. When able to remember   Yes [provider]  ondansetron (ZOFRAN) 4 MG tablet Take 1 tablet (4 mg total) by mouth every 6 (six) hours as needed for nausea. Patient taking differently: Take 4 mg by mouth as needed for nausea. 02/04/23  Yes Sheikh, Omair Latif, DO  oxyCODONE (OXY IR/ROXICODONE) 5 MG immediate release tablet Take 1-2 tablets (5-10 mg total) by mouth every 4 (four) hours as needed for moderate pain, severe pain or breakthrough pain. Patient taking differently: Take 5 mg by mouth as needed for  moderate pain, severe pain or breakthrough pain. 02/02/23  Yes Pickenpack-Cousar, Carlena Sax, NP  pantoprazole (PROTONIX) 40 MG tablet Take 1 tablet (40 mg total) by mouth daily. Patient taking differently: Take 40 mg by mouth daily before breakfast. 12/25/22  Yes Baxley, Cresenciano Lick, MD  senna-docusate (SENOKOT-S) 8.6-50 MG tablet Take 1 tablet by mouth at bedtime. Patient taking differently: Take 1 tablet by mouth daily. 02/04/23  Yes Sheikh, Omair Latif, DO  albuterol (VENTOLIN HFA) 108 (90 Base) MCG/ACT inhaler Inhale 2 puffs into the lungs every 6 (six) hours as needed for wheezing or shortness of breath. Patient not taking: Reported on 02/07/2023 08/15/22   Elby Showers, MD  B  Complex-C (B-COMPLEX WITH VITAMIN C) tablet Take 1 tablet by mouth daily. Patient not taking: Reported on 02/07/2023 01/20/23   Lavina Hamman, MD  Blood Glucose Monitoring Suppl (TRUE METRIX METER) w/Device KIT USE AS DIRECTED 07/30/21   Elby Showers, MD  cetirizine (ZYRTEC) 10 MG tablet Take 10 mg by mouth daily as needed (allergies). Patient not taking: Reported on 02/07/2023    [provider]  chlorpheniramine-HYDROcodone (TUSSIONEX) 10-8 MG/5ML Take 5 mLs by mouth every 12 (twelve) hours as needed for cough. Patient not taking: Reported on 02/07/2023 01/20/23   Lavina Hamman, MD  glucose blood (TRUE METRIX BLOOD GLUCOSE TEST) test strip TEST BLOOD SUGAR TWICE DAILY 10/10/22   Elby Showers, MD  guaiFENesin (MUCINEX) 600 MG 12 hr tablet Take 1 tablet (600 mg total) by mouth 2 (two) times daily. Patient not taking: Reported on 01/24/2023 01/20/23 01/20/24  Lavina Hamman, MD  Lancets (FREESTYLE) lancets CHECK GLUCOSE TWICE A DAY 01/26/20   Elby Showers, MD  loperamide (IMODIUM) 2 MG capsule Take 2-4 mg by mouth 3 (three) times daily as needed for diarrhea or loose stools. Patient not taking: Reported on 02/07/2023    [provider]  losartan (COZAAR) 100 MG tablet Take 1 tablet (100 mg total) by mouth daily. Patient not taking: Reported on 02/07/2023 02/05/23   Raiford Noble Latif, DO  oxymetazoline (AFRIN) 0.05 % nasal spray Place 2 sprays into both nostrils 2 (two) times daily as needed for congestion. Patient not taking: Reported on 02/07/2023 01/20/23 01/20/24  Lavina Hamman, MD  polyethylene glycol (MIRALAX / GLYCOLAX) 17 g packet Take 17 g by mouth daily. Patient not taking: Reported on 02/07/2023 02/05/23   Kerney Elbe, DO     Family History  Problem Relation Age of Onset   Diabetes Mother    Stroke Mother    Colon cancer Sister 28   Colon polyps Neg Hx    Esophageal cancer Neg Hx    Rectal cancer Neg Hx    Stomach cancer Neg Hx    Pancreatic cancer Neg Hx     Prostate cancer Neg Hx    Breast cancer Neg Hx     Social History   Socioeconomic History   Marital status: Divorced    Spouse name: Not on file   Number of children: 3   Years of education: Not on file   Highest education level: Not on file  Occupational History   Not on file  Tobacco Use   Smoking status: Never   Smokeless tobacco: Never  Vaping Use   Vaping Use: Never used  Substance and Sexual Activity   Alcohol use: No   Drug use: No   Sexual activity: Not Currently  Other Topics Concern   Not on file  Social History Narrative   Lives alone   Caffeine use: none   Right handed    Social Determinants of Health   Financial Resource Strain: Medium Risk (12/03/2021)   Overall Financial Resource Strain (CARDIA)    Difficulty of Paying Living Expenses: Somewhat hard  Food Insecurity: No Food Insecurity (01/24/2023)   Hunger Vital Sign    Worried About Running Out of Food in the Last Year: Never true    Ran Out of Food in the Last Year: Never true  Transportation Needs: No Transportation Needs (01/24/2023)   PRAPARE - Hydrologist (Medical): No    Lack of Transportation (Non-Medical): No  Physical Activity: Sufficiently Active (12/03/2021)   Exercise Vital Sign    Days of Exercise per Week: 4 days    Minutes of Exercise per Session: 60 min  Stress: No Stress Concern Present (12/03/2021)   [place]    Feeling of Stress : Only a little  Social Connections: Moderately Isolated (12/03/2021)   Social Connection and Isolation Panel [NHANES]    Frequency of Communication with Friends and Family: More than three times a week    Frequency of Social Gatherings with Friends and Family: More than three times a week    Attends Religious Services: Never    Marine scientist or Organizations: Yes    Attends Archivist Meetings: Never    Marital Status: Divorced      Review of Systems currently denies fever,HA,CP,worsening dyspnea, cough, back pain,N/V or bleeding. Does have some abd discomfort.  Vital Signs: BP 125/73 (BP Location: Left Arm)   Pulse 92   Temp 98 F (36.7 C)   Resp 16   Ht 5' 8"$  (1.727 m)   Wt 188 lb 7.9 oz (85.5 kg)   SpO2 92%   BMI 28.66 kg/m      Physical Exam awake/alert; chest- sl dim BS bases, no audible wheezes; heart- RRR; abd- soft,+BS,NT; no LE edema  Imaging: CT Angio Chest PE W and/or Wo Contrast  Result Date: 02/06/2023 CLINICAL DATA:  SOB EXAM: CT ANGIOGRAPHY CHEST WITH CONTRAST TECHNIQUE: Multidetector CT imaging of the chest was performed using the standard protocol during bolus administration of intravenous contrast. Multiplanar CT image reconstructions and MIPs were obtained to evaluate the vascular anatomy. RADIATION DOSE REDUCTION: This exam was performed according to the departmental dose-optimization program which includes automated exposure control, adjustment of the mA and/or kV according to patient size and/or use of iterative reconstruction technique. CONTRAST:  75 mL OMNIPAQUE IOHEXOL 350 MG/ML SOLN COMPARISON:  01/13/2023 FINDINGS: Cardiovascular: Satisfactory opacification of the pulmonary arteries to the segmental level. No evidence of pulmonary embolism. Normal heart size. No pericardial effusion. Atheromatous changes of the aorta and coronary arteries. No aortic aneurysm or dissection identified. Mediastinum/Nodes: Right paratracheal node measures 1 cm. Left prevascular node measures 1 cm. Superior mediastinal structures unremarkable. Lungs/Pleura: There is extensive diffuse bilateral nodular interstitial process with innumerable lesions. This is consistent with an atypical infection or metastatic disease and there has been no appreciable interval change compared to the prior study from January 2024. Upper Abdomen: Ill-defined mass pancreatic head not fully imaged on this study. Musculoskeletal:  Thoracic degenerative changes noted. No osteolytic or osteoblastic lesions identified. Review of the MIP images confirms the above findings. IMPRESSION: 1. No PE, aneurysm or dissection. 2. Extensive nodular interstitial process throughout the lungs consistent with atypical infection versus metastatic disease without definite interval  change. 3. Nonspecific mildly prominent mediastinal nodes. 4. Pancreatic head mass that was not fully imaged on this study. Electronically Signed   By: Sammie Bench M.D.   On: 02/06/2023 12:33   DG Chest Port 1 View  Result Date: 02/06/2023 CLINICAL DATA:  Dyspnea EXAM: PORTABLE CHEST 1 VIEW COMPARISON:  02/04/2023 FINDINGS: The heart size and mediastinal contours are within normal limits. Diffuse bilateral interstitial pulmonary opacity. The visualized skeletal structures are unremarkable. IMPRESSION: Diffuse bilateral interstitial pulmonary opacity, consistent with edema or atypical/viral infection. No new or focal airspace opacity. Electronically Signed   By: Delanna Ahmadi M.D.   On: 02/06/2023 10:20   DG CHEST PORT 1 VIEW  Result Date: 02/04/2023 CLINICAL DATA:  SOB EXAM: PORTABLE CHEST 1 VIEW COMPARISON:  02/03/2023 FINDINGS: Diffuse interstitial and alveolar opacity with no pneumothorax or pleural effusion identified. Unremarkable cardiac silhouette. IMPRESSION: Diffuse interstitial and alveolar edema versus bilateral pneumonia. Electronically Signed   By: Sammie Bench M.D.   On: 02/04/2023 08:01   DG CHEST PORT 1 VIEW  Result Date: 02/03/2023 CLINICAL DATA:  sob shortness of breath EXAM: PORTABLE CHEST - 1 VIEW COMPARISON:  02/02/2023 FINDINGS: Scattered interstitial and airspace opacities throughout both lungs, involving bases more than apices, grossly stable compared to the previous radiograph. Heart size and mediastinal contours are within normal limits. Aortic Atherosclerosis (ICD10-170.0). No effusion. Visualized bones unremarkable.  Surgical clips right  breast. IMPRESSION: Stable bilateral interstitial and airspace opacities. No acute findings. Electronically Signed   By: Lucrezia Europe M.D.   On: 02/03/2023 08:18   DG CHEST PORT 1 VIEW  Result Date: 02/02/2023 CLINICAL DATA:  Shortness of breath EXAM: PORTABLE CHEST 1 VIEW COMPARISON:  02/01/2023 and prior studies FINDINGS: The cardiomediastinal silhouette is unchanged. Bilateral interstitial/mild airspace opacities again noted, slightly increased on the RIGHT. There is no evidence of pneumothorax or large pleural effusion. No other changes are noted. IMPRESSION: Bilateral interstitial/mild airspace opacities, slightly increased on the RIGHT. Electronically Signed   By: Margarette Canada M.D.   On: 02/02/2023 08:25   DG CHEST PORT 1 VIEW  Result Date: 02/01/2023 CLINICAL DATA:  Shortness of breath.  Pneumonia. EXAM: PORTABLE CHEST 1 VIEW COMPARISON:  01/31/2023 FINDINGS: Heart size is stable. Diffuse interstitial prominence noted. Mild airspace disease is seen in both lower lungs, without significant change. No evidence of pleural effusion. IMPRESSION: No significant change in diffuse interstitial prominence and mild bibasilar airspace disease. Electronically Signed   By: Marlaine Hind M.D.   On: 02/01/2023 09:03   DG CHEST PORT 1 VIEW  Result Date: 01/31/2023 CLINICAL DATA:  Shortness of breath, dyspnea EXAM: PORTABLE CHEST 1 VIEW COMPARISON:  01/30/2023 FINDINGS: Bilateral lower lobe airspace disease and mild heterogeneous upper lobe airspace disease concerning for multilobar pneumonia. Right lower lobe airspace disease has improved compared with 01/24/2023. No pleural effusion or pneumothorax. Heart and mediastinal contours are unremarkable. No acute osseous abnormality. Osteoarthritis of bilateral glenohumeral joints. IMPRESSION: 1. Bilateral lower lobe airspace disease and mild heterogeneous upper lobe airspace disease concerning for multilobar pneumonia. Electronically Signed   By: Kathreen Devoid M.D.   On:  01/31/2023 12:20   US Abdomen Limited RUQ (LIVER/GB)  Result Date: 01/30/2023 CLINICAL DATA:  Abnormal liver function tests. EXAM: ULTRASOUND ABDOMEN LIMITED RIGHT UPPER QUADRANT COMPARISON:  October 03, 2014.  November 14, 2014. FINDINGS: Gallbladder: Status post cholecystectomy. Common bile duct: Diameter: 4 mm which is within normal limits. Liver: No focal lesion identified. Nodular hepatic contours are noted suggesting cirrhosis. Heterogeneous  echotexture of hepatic parenchyma is noted. Portal vein is patent on color Doppler imaging with normal direction of blood flow towards the liver. Other: None. IMPRESSION: Status post cholecystectomy. Findings consistent with hepatic cirrhosis. No definite focal sonographic hepatic abnormality seen. Electronically Signed   By: Marijo Conception M.D.   On: 01/30/2023 15:30   DG CHEST PORT 1 VIEW  Result Date: 01/30/2023 CLINICAL DATA:  [id number] with shortness of breath. EXAM: PORTABLE CHEST 1 VIEW COMPARISON:  Portable chest yesterday at 9:29 a.m. FINDINGS: 4:55 a.m. There is interval worsening of patchy heterogeneous airspace disease in the lower lung fields. Small pleural effusions appear similar. The upper lung fields are generally clear. There is mild cardiomegaly without findings of acute CHF. The aorta is tortuous with calcification in the transverse segment. Stable mediastinum. Slight thoracic dextroscoliosis.  No new osseous abnormality. IMPRESSION: Interval worsening of patchy heterogeneous airspace disease in the lower lung fields bilaterally. Small pleural effusions. No findings of acute CHF or other significant change. Electronically Signed   By: Telford Nab M.D.   On: 01/30/2023 07:18   DG CHEST PORT 1 VIEW  Result Date: 01/29/2023 CLINICAL DATA:  Short of breath EXAM: PORTABLE CHEST 1 VIEW COMPARISON:  Radiograph 01/24/2023, CT 01/13/2023 FINDINGS: Normal cardiac silhouette. Patchy bilateral airspace opacities appears slightly improved. No focal  consolidation. No pneumothorax. No acute osseous abnormality. IMPRESSION: Slight improvement in patchy bilateral airspace opacities. Electronically Signed   By: Suzy Bouchard M.D.   On: 01/29/2023 09:59   ECHOCARDIOGRAM COMPLETE  Result Date: 01/26/2023    ECHOCARDIOGRAM REPORT   Patient Name:   KAMALA PENDLETON Date of Exam: 01/26/2023 Medical Rec #:  [id number]         Height:       68.0 in Accession #:    [id number]        Weight:       193.2 lb Date of Birth:  [birth year]         BSA:          2.014 m Patient Age:    64 years          BP:           144/87 mmHg Patient Gender: F                 HR:           88 bpm. Exam Location:  Inpatient Procedure: 2D Echo Indications:    CHF  History:        Patient has prior history of Echocardiogram examinations, most                 recent 11/22/2022. Risk Factors:Diabetes and Hypertension.  Sonographer:    Harvie Junior Referring Phys: [id number] DANIEL V THOMPSON  Sonographer Comments: Image acquisition challenging due to respiratory motion. IMPRESSIONS  1. Left ventricular ejection fraction, by estimation, is 60 to 65%. The left ventricle has normal function. The left ventricle has no regional wall motion abnormalities. There is mild concentric left ventricular hypertrophy. Left ventricular diastolic parameters are consistent with Grade I diastolic dysfunction (impaired relaxation).  2. Right ventricular systolic function is normal. The right ventricular size is normal. There is normal pulmonary artery systolic pressure.  3. No evidence of mitral valve regurgitation.  4. The aortic valve is grossly normal. Aortic valve regurgitation is not visualized.  5. The inferior vena cava is normal in size with greater than 50% respiratory variability, suggesting right atrial pressure of 3  mmHg. Comparison(s): No significant change from prior study. FINDINGS  Left Ventricle: Left ventricular ejection fraction, by estimation, is 60 to 65%. The left ventricle has normal function. The  left ventricle has no regional wall motion abnormalities. The left ventricular internal cavity size was normal in size. There is  mild concentric left ventricular hypertrophy. Left ventricular diastolic parameters are consistent with Grade I diastolic dysfunction (impaired relaxation). Right Ventricle: The right ventricular size is normal. Right ventricular systolic function is normal. There is normal pulmonary artery systolic pressure. The tricuspid regurgitant velocity is 2.71 m/s, and with an assumed right atrial pressure of 3 mmHg,  the estimated right ventricular systolic pressure is [id number] mmHg. Left Atrium: Left atrial size was normal in size. Right Atrium: Right atrial size was normal in size. Pericardium: There is no evidence of pericardial effusion. Mitral Valve: No evidence of mitral valve regurgitation. Tricuspid Valve: Tricuspid valve regurgitation is mild. Aortic Valve: The aortic valve is grossly normal. Aortic valve regurgitation is not visualized. Aortic valve mean gradient measures 3.0 mmHg. Aortic valve peak gradient measures 5.6 mmHg. Aortic valve area, by VTI measures 2.78 cm. Pulmonic Valve: Pulmonic valve regurgitation is not visualized. Aorta: The aortic root and ascending aorta are structurally normal, with no evidence of dilitation. Venous: The inferior vena cava is normal in size with greater than 50% respiratory variability, suggesting right atrial pressure of 3 mmHg. IAS/Shunts: No atrial level shunt detected by color flow Doppler.  LEFT VENTRICLE PLAX 2D LVIDd:         3.50 cm     Diastology LVIDs:         2.40 cm     LV e' medial:    5.33 cm/s LV PW:         1.10 cm     LV E/e' medial:  12.5 LV IVS:        1.30 cm     LV e' lateral:   7.29 cm/s LVOT diam:     2.00 cm     LV E/e' lateral: 9.2 LV SV:         61 LV SV Index:   30 LVOT Area:     3.14 cm                             3D Volume EF: LV Volumes (MOD)           3D EF:        55 % LV vol d, MOD A2C: 51.5 ml LV EDV:       119 ml LV  vol d, MOD A4C: 71.4 ml LV ESV:       54 ml LV vol s, MOD A2C: 20.2 ml LV SV:        66 ml LV vol s, MOD A4C: 27.8 ml LV SV MOD A2C:     31.3 ml LV SV MOD A4C:     71.4 ml LV SV MOD BP:      37.2 ml RIGHT VENTRICLE RV S prime:     17.00 cm/s TAPSE (M-mode): 1.9 cm LEFT ATRIUM           Index LA diam:      3.20 cm 1.59 cm/m LA Vol (A4C): 40.9 ml 20.31 ml/m  AORTIC VALVE                    PULMONIC VALVE AV Area (Vmax):    2.60  cm     PV Vmax:       0.81 m/s AV Area (Vmean):   2.56 cm     PV Peak grad:  2.7 mmHg AV Area (VTI):     2.78 cm AV Vmax:           118.00 cm/s AV Vmean:          81.400 cm/s AV VTI:            0.220 m AV Peak Grad:      5.6 mmHg AV Mean Grad:      3.0 mmHg LVOT Vmax:         97.70 cm/s LVOT Vmean:        66.400 cm/s LVOT VTI:          0.195 m LVOT/AV VTI ratio: 0.89  AORTA Ao Root diam: 3.40 cm Ao Asc diam:  3.50 cm MITRAL VALVE               TRICUSPID VALVE MV Area (PHT): 3.99 cm    TR Peak grad:   29.4 mmHg MV Decel Time: 190 msec    TR Vmax:        271.00 cm/s MV E velocity: 66.80 cm/s MV A velocity: 95.50 cm/s  SHUNTS MV E/A ratio:  0.70        Systemic VTI:  0.20 m                            Systemic Diam: 2.00 cm Phineas Inches Electronically signed by Phineas Inches Signature Date/Time: 01/26/2023/1:19:22 PM    Final    DG Chest Portable 1 View  Result Date: 01/24/2023 CLINICAL DATA:  Shortness of breath, recent pneumonia and PE. EXAM: PORTABLE CHEST 1 VIEW COMPARISON:  Chest x-ray dated 12/06/2022. FINDINGS: Diffuse bilateral interstitial prominence, presumably edema, and patchy bibasilar airspace opacities. No pleural effusion or pneumothorax is seen. Heart size and mediastinal contours are stable. IMPRESSION: 1. Patchy bibasilar airspace opacities, multifocal pneumonia versus asymmetric pulmonary edema. 2. Bilateral interstitial prominence, also compatible with atypical pneumonia versus edema. Electronically Signed   By: Franki Cabot M.D.   On: 01/24/2023 10:40   VAS [place] UPPER  EXTREMITY VENOUS DUPLEX  Result Date: 01/20/2023 UPPER VENOUS STUDY  Patient Name:  XOLA BEITER  Date of Exam:   01/20/2023 Medical Rec #: [id number]          Accession #:    [id number] Date of Birth: 1941/01/09          Patient Gender: F Patient Age:   39 years Exam Location:  [facility] Procedure:      VAS [place] UPPER EXTREMITY VENOUS DUPLEX Referring Phys: PRANAV PATEL --------------------------------------------------------------------------------  Indications: Edema Risk Factors: None identified. Limitations: Poor ultrasound/tissue interface. Comparison Study: No prior studies. Performing Technologist: Oliver Hum RVT  Examination Guidelines: A complete evaluation includes B-mode imaging, spectral Doppler, color Doppler, and power Doppler as needed of all accessible portions of each vessel. Bilateral testing is considered an integral part of a complete examination. Limited examinations for reoccurring indications may be performed as noted.  Right Findings: +----------+------------+---------+-----------+----------+-------+ RIGHT     CompressiblePhasicitySpontaneousPropertiesSummary +----------+------------+---------+-----------+----------+-------+ Subclavian    Full       Yes       Yes                      +----------+------------+---------+-----------+----------+-------+  Left Findings: +----------+------------+---------+-----------+----------+-------+ LEFT  CompressiblePhasicitySpontaneousPropertiesSummary +----------+------------+---------+-----------+----------+-------+ IJV           Full       Yes       Yes                      +----------+------------+---------+-----------+----------+-------+ Subclavian    None       No        No                Acute  +----------+------------+---------+-----------+----------+-------+ Axillary      None       No        No                Acute  +----------+------------+---------+-----------+----------+-------+  Brachial      Full       No        No                       +----------+------------+---------+-----------+----------+-------+ Radial        Full                                          +----------+------------+---------+-----------+----------+-------+ Ulnar         Full                                          +----------+------------+---------+-----------+----------+-------+ Cephalic      Full                                          +----------+------------+---------+-----------+----------+-------+ Basilic       None                                   Acute  +----------+------------+---------+-----------+----------+-------+  Summary:  Right: No evidence of thrombosis in the subclavian.  Left: Findings consistent with acute deep vein thrombosis involving the left subclavian vein and left axillary vein. Findings consistent with acute superficial vein thrombosis involving the left basilic vein.  *See table(s) above for measurements and observations.  Diagnosing physician: Monica Martinez MD Electronically signed by Monica Martinez MD on 01/20/2023 at 5:43:57 PM.    Final    CT Angio Chest PE W and/or Wo Contrast  Result Date: 01/14/2023 CLINICAL DATA:  Abdominal pain. Also concern for pulmonary embolism. EXAM: CT ANGIOGRAPHY CHEST CT ABDOMEN AND PELVIS WITH CONTRAST TECHNIQUE: Multidetector CT imaging of the chest was performed using the standard protocol during bolus administration of intravenous contrast. Multiplanar CT image reconstructions and MIPs were obtained to evaluate the vascular anatomy. Multidetector CT imaging of the abdomen and pelvis was performed using the standard protocol during bolus administration of intravenous contrast. RADIATION DOSE REDUCTION: This exam was performed according to the departmental dose-optimization program which includes automated exposure control, adjustment of the mA and/or kV according to patient size and/or use of iterative  reconstruction technique. CONTRAST:  185m OMNIPAQUE IOHEXOL 350 MG/ML SOLN COMPARISON:  CT abdomen pelvis dated 12/27/2022 and chest CT dated 11/21/2022. FINDINGS: Evaluation is limited due to respiratory motion and streak artifact caused by the patient's arms. CTA  CHEST FINDINGS Cardiovascular: There is no cardiomegaly or pericardial effusion. Mild atherosclerotic calcification of the thoracic aorta. No aneurysmal dilatation or dissection. Evaluation of the pulmonary arteries is limited due to respiratory motion and suboptimal visualization of the peripheral branches. No obvious large central pulmonary artery embolus identified. Mediastinum/Nodes: Top-normal bilateral hilar lymph nodes measure 1 cm short axis. The esophagus is grossly unremarkable. No mediastinal fluid collection. Lungs/Pleura: Scattered clusters of ground-glass nodular densities throughout the lungs suspicious for atypical infection. Metastatic disease is not excluded clinical correlation is recommended. There is no pleural effusion or pneumothorax. The central airways are patent. Musculoskeletal: Degenerative changes of the spine and osteopenia. No acute osseous pathology. Review of the MIP images confirms the above findings. CT ABDOMEN and PELVIS FINDINGS No intra-abdominal free air or free fluid. Hepatobiliary: Cirrhosis. Mild biliary dilatation versus periportal edema. Cholecystectomy. Pancreas: Ill-defined soft tissue mass in the region of the head of the pancreas measuring approximately 4.9 x 3.5 cm (22/4). There is atrophy of the body of the pancreas and dilatation of the main pancreatic duct. No active inflammatory changes. Spleen: Normal in size without focal abnormality. Adrenals/Urinary Tract: The adrenal glands unremarkable. Small bilateral renal cysts. There is no hydronephrosis on either side. There is symmetric enhancement and excretion of contrast by both kidneys. The visualized ureters and urinary bladder appear unremarkable.  Stomach/Bowel: Severe colonic diverticulosis without active inflammatory changes. There is no bowel obstruction active inflammation. The appendix is normal. Vascular/Lymphatic: Moderate aortoiliac atherosclerotic disease. The IVC is unremarkable. No portal venous gas. Retroperitoneal adenopathy measures 13 mm to the left of the aorta, 17 mm in the periportal region, and 19 mm in the gastrohepatic vision. Reproductive: Hysterectomy.  No adnexal masses. Other: Small fat containing umbilical and supraumbilical hernia. Musculoskeletal: Degenerative changes of the spine. No acute osseous pathology. Review of the MIP images confirms the above findings. IMPRESSION: 1. No CT evidence of central pulmonary artery embolus. 2. Scattered clusters of ground-glass nodular densities throughout the lungs may represent atypical infection or metastatic disease. 3. Ill-defined soft tissue mass in the region of the head of the pancreas with atrophy of the body of the pancreas and dilatation of the main pancreatic duct. Findings are concerning for pancreatic malignancy. Further evaluation with MRI with and without contrast recommended. 4. Retroperitoneal adenopathy. 5. Cirrhosis. 6. Severe colonic diverticulosis without active inflammatory changes. No bowel obstruction. Normal appendix. 7.  Aortic Atherosclerosis (ICD10-I70.0). Electronically Signed   By: Anner Crete M.D.   On: 01/14/2023 00:04   CT ABDOMEN PELVIS W CONTRAST  Result Date: 01/14/2023 CLINICAL DATA:  Abdominal pain. Also concern for pulmonary embolism. EXAM: CT ANGIOGRAPHY CHEST CT ABDOMEN AND PELVIS WITH CONTRAST TECHNIQUE: Multidetector CT imaging of the chest was performed using the standard protocol during bolus administration of intravenous contrast. Multiplanar CT image reconstructions and MIPs were obtained to evaluate the vascular anatomy. Multidetector CT imaging of the abdomen and pelvis was performed using the standard protocol during bolus  administration of intravenous contrast. RADIATION DOSE REDUCTION: This exam was performed according to the departmental dose-optimization program which includes automated exposure control, adjustment of the mA and/or kV according to patient size and/or use of iterative reconstruction technique. CONTRAST:  137m OMNIPAQUE IOHEXOL 350 MG/ML SOLN COMPARISON:  CT abdomen pelvis dated 12/27/2022 and chest CT dated 11/21/2022. FINDINGS: Evaluation is limited due to respiratory motion and streak artifact caused by the patient's arms. CTA CHEST FINDINGS Cardiovascular: There is no cardiomegaly or pericardial effusion. Mild atherosclerotic calcification of the thoracic aorta. No  aneurysmal dilatation or dissection. Evaluation of the pulmonary arteries is limited due to respiratory motion and suboptimal visualization of the peripheral branches. No obvious large central pulmonary artery embolus identified. Mediastinum/Nodes: Top-normal bilateral hilar lymph nodes measure 1 cm short axis. The esophagus is grossly unremarkable. No mediastinal fluid collection. Lungs/Pleura: Scattered clusters of ground-glass nodular densities throughout the lungs suspicious for atypical infection. Metastatic disease is not excluded clinical correlation is recommended. There is no pleural effusion or pneumothorax. The central airways are patent. Musculoskeletal: Degenerative changes of the spine and osteopenia. No acute osseous pathology. Review of the MIP images confirms the above findings. CT ABDOMEN and PELVIS FINDINGS No intra-abdominal free air or free fluid. Hepatobiliary: Cirrhosis. Mild biliary dilatation versus periportal edema. Cholecystectomy. Pancreas: Ill-defined soft tissue mass in the region of the head of the pancreas measuring approximately 4.9 x 3.5 cm (22/4). There is atrophy of the body of the pancreas and dilatation of the main pancreatic duct. No active inflammatory changes. Spleen: Normal in size without focal abnormality.  Adrenals/Urinary Tract: The adrenal glands unremarkable. Small bilateral renal cysts. There is no hydronephrosis on either side. There is symmetric enhancement and excretion of contrast by both kidneys. The visualized ureters and urinary bladder appear unremarkable. Stomach/Bowel: Severe colonic diverticulosis without active inflammatory changes. There is no bowel obstruction active inflammation. The appendix is normal. Vascular/Lymphatic: Moderate aortoiliac atherosclerotic disease. The IVC is unremarkable. No portal venous gas. Retroperitoneal adenopathy measures 13 mm to the left of the aorta, 17 mm in the periportal region, and 19 mm in the gastrohepatic vision. Reproductive: Hysterectomy.  No adnexal masses. Other: Small fat containing umbilical and supraumbilical hernia. Musculoskeletal: Degenerative changes of the spine. No acute osseous pathology. Review of the MIP images confirms the above findings. IMPRESSION: 1. No CT evidence of central pulmonary artery embolus. 2. Scattered clusters of ground-glass nodular densities throughout the lungs may represent atypical infection or metastatic disease. 3. Ill-defined soft tissue mass in the region of the head of the pancreas with atrophy of the body of the pancreas and dilatation of the main pancreatic duct. Findings are concerning for pancreatic malignancy. Further evaluation with MRI with and without contrast recommended. 4. Retroperitoneal adenopathy. 5. Cirrhosis. 6. Severe colonic diverticulosis without active inflammatory changes. No bowel obstruction. Normal appendix. 7.  Aortic Atherosclerosis (ICD10-I70.0). Electronically Signed   By: Anner Crete M.D.   On: 01/14/2023 00:04    Labs:  CBC: Recent Labs    02/03/23 0512 02/04/23 0536 02/06/23 1011 02/07/23 0507  WBC 9.6 9.0 12.7* 8.5  HGB 9.4* 8.7* 8.6* 7.2*  HCT 30.2* 28.0* 28.5* 24.3*  PLT 231 201 253 193    COAGS: Recent Labs    11/21/22 1023 11/24/22 0602 11/24/22 1111  11/25/22 0102 11/26/22 0051 01/24/23 1045 02/06/23 1011  INR 1.2  --   --   --   --  1.8* 2.4*  APTT  --    < > 113* 148* 85*  82* 33  --    < > = values in this interval not displayed.    BMP: Recent Labs    02/03/23 0512 02/04/23 0536 02/06/23 1011 02/07/23 0507  NA 137 134* 137 136  K 4.5 3.9 3.9 3.7  CL 98 96* 97* 101  CO2 29 26 25 23  $ GLUCOSE 155* 186* 235* 136*  BUN 20 19 24* 25*  CALCIUM 8.9 8.3* 8.4* 8.0*  CREATININE 0.93 0.89 1.38* 1.24*  GFRNONAA >60 >60 39* 44*    LIVER FUNCTION TESTS: Recent  Labs    02/02/23 0527 02/03/23 0512 02/04/23 0536 02/06/23 1011  BILITOT 1.0 1.3* 1.1 1.3*  AST 89* 95* 85* 90*  ALT 59* 66* 65* 61*  ALKPHOS 222* 245* 255* 243*  PROT 6.4* 7.7 6.8 7.0  ALBUMIN 2.7* 3.0* 2.7* 2.6*    TUMOR MARKERS: No results for input(s): "AFPTM", "CEA", "CA199", "CHROMGRNA" in the last 8760 hours.  Assessment and Plan: 82 y.o. female with PMH sig for anxiety, remote right breast cancer with prior lumpectomy/radiation, cirrhosis, PE/DVT, hepatitis C, DM, HTN. She presents now with newly diagnosed metastatic pancreatic cancer/HCAP (readmitted to [facility] yesterday with hypoxia) . Request received from oncology for port a cath placement to assist with chemotherapy. She is currently afebrile, O2 sats 92%  3 liters N/C. WBC nl, hgb 7.2, plts nl, creat 1.24, PT 25.5/INR 2.4, blood cx neg to date. CT angio chest yesterday revealed:  1. No PE, aneurysm or dissection. 2. Extensive nodular interstitial process throughout the lungs consistent with atypical infection versus metastatic disease without definite interval change. 3. Nonspecific mildly prominent mediastinal nodes. 4. Pancreatic head mass that was not fully imaged on this study  She is on IV vancomycin, maxipime, eliquis.Risks and benefits of image guided port-a-catheter placement was discussed with the patient including, but not limited to bleeding, infection, pneumothorax, or fibrin sheath  development and need for additional procedures.  All of the patient's questions were answered, patient is agreeable to proceed. Consent signed and in chart.    Thank you for this interesting consult.  I greatly enjoyed meeting MARTINE BEASTON and look forward to participating in their care.  A copy of this report was sent to the requesting provider on this date.  Electronically Signed: D. Rowe Robert, PA-C 02/07/2023, 1:17 PM   I spent a total of  20 minutes   in face to face in clinical consultation, greater than 50% of which was counseling/coordinating care for port a cath placement

## 2023-02-08 DIAGNOSIS — C799 Secondary malignant neoplasm of unspecified site: Secondary | ICD-10-CM | POA: Diagnosis not present

## 2023-02-08 DIAGNOSIS — Z86718 Personal history of other venous thrombosis and embolism: Secondary | ICD-10-CM

## 2023-02-08 DIAGNOSIS — D649 Anemia, unspecified: Secondary | ICD-10-CM

## 2023-02-08 DIAGNOSIS — N179 Acute kidney failure, unspecified: Secondary | ICD-10-CM | POA: Diagnosis not present

## 2023-02-08 DIAGNOSIS — Z7189 Other specified counseling: Secondary | ICD-10-CM | POA: Diagnosis not present

## 2023-02-08 DIAGNOSIS — J189 Pneumonia, unspecified organism: Secondary | ICD-10-CM | POA: Diagnosis not present

## 2023-02-08 DIAGNOSIS — C259 Malignant neoplasm of pancreas, unspecified: Secondary | ICD-10-CM | POA: Diagnosis not present

## 2023-02-08 LAB — CBC
HCT: 21.6 % — ABNORMAL LOW (ref 36.0–46.0)
Hemoglobin: 6.7 g/dL — CL (ref 12.0–15.0)
MCH: 28.3 pg (ref 26.0–34.0)
MCHC: 31 g/dL (ref 30.0–36.0)
MCV: 91.1 fL (ref 80.0–100.0)
Platelets: 173 10*3/uL (ref 150–400)
RBC: 2.37 MIL/uL — ABNORMAL LOW (ref 3.87–5.11)
RDW: 17.6 % — ABNORMAL HIGH (ref 11.5–15.5)
WBC: 7.4 10*3/uL (ref 4.0–10.5)
nRBC: 0 % (ref 0.0–0.2)

## 2023-02-08 LAB — BASIC METABOLIC PANEL
Anion gap: 9 (ref 5–15)
BUN: 20 mg/dL (ref 8–23)
CO2: 24 mmol/L (ref 22–32)
Calcium: 7.7 mg/dL — ABNORMAL LOW (ref 8.9–10.3)
Chloride: 104 mmol/L (ref 98–111)
Creatinine, Ser: 1.03 mg/dL — ABNORMAL HIGH (ref 0.44–1.00)
GFR, Estimated: 55 mL/min — ABNORMAL LOW (ref >60)
Glucose, Bld: 124 mg/dL — ABNORMAL HIGH (ref 70–99)
Potassium: 3.3 mmol/L — ABNORMAL LOW (ref 3.5–5.1)
Sodium: 137 mmol/L (ref 135–145)

## 2023-02-08 LAB — PREPARE RBC (CROSSMATCH)

## 2023-02-08 MED ORDER — POTASSIUM CHLORIDE CRYS ER 20 MEQ PO TBCR
20.0000 meq | EXTENDED_RELEASE_TABLET | Freq: Once | ORAL | Status: AC
  Administered 2023-02-08: 20 meq via ORAL

## 2023-02-08 MED ORDER — POTASSIUM CHLORIDE CRYS ER 20 MEQ PO TBCR
20.0000 meq | EXTENDED_RELEASE_TABLET | Freq: Once | ORAL | Status: AC
  Administered 2023-02-08: 20 meq via ORAL
  Filled 2023-02-08: qty 1

## 2023-02-08 MED ORDER — SODIUM CHLORIDE 0.9% FLUSH
10.0000 mL | INTRAVENOUS | Status: DC | PRN
  Administered 2023-02-08: 10 mL

## 2023-02-08 MED ORDER — ACETAMINOPHEN 325 MG PO TABS
650.0000 mg | ORAL_TABLET | Freq: Once | ORAL | Status: AC
  Administered 2023-02-08: 650 mg via ORAL
  Filled 2023-02-08: qty 2

## 2023-02-08 MED ORDER — SODIUM CHLORIDE 0.9 % IV SOLN: INTRAVENOUS | Status: DC

## 2023-02-08 MED ORDER — SODIUM CHLORIDE 0.9% IV SOLUTION: Freq: Once | INTRAVENOUS | Status: AC

## 2023-02-08 MED ORDER — DIPHENHYDRAMINE HCL 25 MG PO CAPS
25.0000 mg | ORAL_CAPSULE | Freq: Once | ORAL | Status: AC
  Administered 2023-02-08: 25 mg via ORAL
  Filled 2023-02-08: qty 1

## 2023-02-08 MED ORDER — FUROSEMIDE 10 MG/ML IJ SOLN
20.0000 mg | Freq: Once | INTRAMUSCULAR | Status: AC
  Administered 2023-02-08: 20 mg via INTRAVENOUS
  Filled 2023-02-08: qty 2

## 2023-02-08 NOTE — Progress Notes (Signed)
Palliative Medicine Inpatient Follow Up Note     Chart Reviewed. Patient assessed at the bedside.   Victoria [name] is resting comfortably in bed. Denies nausea, vomiting, constipation, or diarrhea. Endorses cough which is controlled with medication. Appetite is good. Ongoing fatigue.   Victoria [name] reports her pain is well controlled. Rates pain 2 out 10 at the time of my assessment. Several incidences of anxiety. Her anxiousness increases when she has coughing spells or gets dyspneic with exertion as she begins to feel as though she cannot take a deep breath. We discussed deep breathing techniques. Ativan available as needed. Feels this is effective when taking. Verbalizes understanding we will not make any adjustments to medications given controlled symptoms.   She reports ongoing discussions with her family regarding her cancer and overall health. Confirms wishes remain at this time to focus on getting to a place where she can have treatment and "fight". Realistic and understands long-term prognosis is not favorable but is hopeful that she can continue to live as long as she can.   Expresses concerns about being at home by herself. She has children and during previous discussions with patient's son, Victoria [name] he advised he would be staying with patient vs she coming to stay with him for some time however patient is unsure of this plan at the moment.   Discussed the importance of continued conversation with family and their  medical providers regarding overall plan of care and treatment options, ensuring decisions are within the context of the patients values and GOCs.   Questions addressed and support provided.  Patient aware palliative will continue to support as needed. She will have follow-up appointment scheduled with me in the clinic at the [facility] in collaboration with her Oncologist, Dr. Burr [name].   Objective Assessment: Vital Signs Vitals:   02/08/23 1056 02/08/23 1132  BP: 124/71  108/71  Pulse: 89 88  Resp: 18 18  Temp: 97.8 F (36.6 C) 98.4 F (36.9 C)  SpO2: 98% 98%    Intake/Output Summary (Last 24 hours) at 02/08/2023 1146 Last data filed at 02/08/2023 1115 Gross per 24 hour  Intake 1940.18 ml  Output --  Net 1940.18 ml   Last Weight  Most recent update: 02/06/2023  9:30 AM    Weight  85.5 kg (188 lb 7.9 oz)            Gen:  NAD HEENT: moist mucous membranes CV: Regular rate and rhythm, no murmurs rubs or gallops PULM: clear to auscultation bilaterally. No wheezes/rales/rhonchi ABD: soft/nontender/nondistended/normal bowel sounds EXT: No edema Neuro: Alert and oriented x3  SUMMARY OF RECOMMENDATIONS   Full Code/Full Scope Continue with current plan of care per medical team. Patient clear in expressed wishes to treat the treatable allowing her an opportunity to live. Is hopeful she will be able to pursue oncological treatment. Realistic in her understanding however also conflicted in son's acceptance. Encouraged ongoing discussions. Offered to facilitate additional discussions. Patient would like to continue to speak with him privately for now but would like to have family discussions in the future.  Patient is concerned about going home alone or without oxygen. Advised if criteria is met for home oxygen needed this would be arranged.  PMT will continue to support and follow on as needed basis. Goals clear. Patient will follow-up at [facility] s/p discharge. She is aware. Please secure chat for urgent needs.   Symptom Management:  Neoplasm related pain MS Contin 15 mg  every 12 hours Oxycodone 5-10 mg every 4 hours as needed for breakthrough pain (5 doses over the past 24 hours=20 mg) Hydromorphone 0.80m every 6 hours as needed (none administered over past 24 hours) Constipation Miralax daily Senna daily Nausea  Zofran daily  Anxiety Xanax 0.5 mg twice daily as needed (1 dose over past 24 hours)  Time Total: 30 min    Visit consisted of counseling and education dealing with the complex and emotionally intense issues of symptom management and palliative care in the setting of serious and potentially life-threatening illness.Greater than 50%  of this time was spent counseling and coordinating care related to the above assessment and plan.  NAlda Lea AGPCNP-BC  PLamar [phone] Palliative Medicine Team providers are available by phone from 7am to 7pm daily and can be reached through the team cell phone. Should this patient require assistance outside of these hours, please call the patient's attending physician.

## 2023-02-08 NOTE — Progress Notes (Signed)
PROGRESS NOTE    Victoria [name]  [id number] DOB: Apr 09, 1941 DOA: 02/06/2023 PCP: Elby Showers, MD    Chief Complaint  Patient presents with   Weakness    Brief Narrative:  HPI per Dr. Lonni Fix is a 82 y.o. female with medical history significant for tree of DVT and PE on Eliquis, insulin-dependent type 2 diabetes, pancreatic cancer and recent hospital stay for multifocal community-acquired pneumonia who is being admitted to the hospital with recurrent hypoxic respiratory failure and concern for healthcare acquired pneumonia.  Patient was discharged from this hospital on 2/13 after a stay during which she was treated with empiric IV azithromycin and Rocephin for 5 days.  Patient improved, was stable on room air and discharged home in stable condition yesterday.  Of note, the day prior to discharge from the hospital patient had some hypoxic respiratory failure was seen by pulmonary who did not recommend any changes in management.  She has actually been feeling pretty well, denies any chest pain, fevers, nausea, vomiting.  She came to [facility] today for palliative care outpatient consultation, and chest port placement with radiology.  However she was noted to be saturating in the 80s on room air, and sent to the emergency department for evaluation.   ED Course: Patient was evaluated in the emergency department, he had reportedly been saturating in the 80s on room air in the outpatient clinic.  Was started on 2 L nasal cannula oxygen now saturating 99%.  Lab work reveals new leukocytosis of 13,000, hemoglobin stable at 8.6 platelets 253.  Creatinine up from normal baseline to 1.38.  Patient was given empiric IV Zosyn for suspected healthcare acquired pneumonia, and hospitalist contacted for admission.  Assessment & Plan:   Principal Problem:   HCAP (healthcare-associated pneumonia) Active Problems:   DM2 (diabetes mellitus, type 2) ([place])   Hypertension    Hepatitis C, chronic (HCC)   Pancreatic cancer ([place])   History of DVT (deep vein thrombosis)   Palliative care patient   AKI (acute kidney injury) ([place])   Metastatic malignant neoplasm ([place])  #1 hypoxia/HCAP -Patient admitted with hypoxia noted to have sats of 80% on room air in the outpatient clinic setting and started on 2 L nasal cannula. -CBC done on admission with a leukocytosis white count of 13,000, hemoglobin noted to be stable at 8.6, chest x-ray done with diffuse bilateral interstitial pulmonary opacity, consistent with edema or atypical/viral infection.  No new or focal airspace opacity. -CT angiogram chest no PE, aneurysm or dissection.  Extensive nodular interstitial process throughout the lungs consistent with atypical infection versus metastatic disease with an definite interval change.  Nonspecific mildly prominent mediastinal nodes.  Pancreatic head mass that was not fully imaged on this study. -Patient's hypoxia likely secondary to metastatic disease from pancreatic cancer. -Patient recently hospitalized and treated empirically with antibiotics for pneumonia on 2 recent separate occasions.  Patient also treated for volume overload. -Patient afebrile.  Denies any significant productive cough. -Urine strep pneumococcus antigen negative. -Urine Legionella antigen pending. -MRSA PCR negative. -Discontinue IV vancomycin. -Discontinue cefepime and monitor off antibiotics. -Check ambulatory sats. -May need home O2 on discharge.  2.  Anemia of chronic disease -Patient with no overt bleeding, hemoglobin currently at 6.7 today from 7.2 from 8.6 on admission. -Anemia panel done 01/24/2023 with iron level of 87, TIBC of 395. -Transfuse 2 units packed red blood cells. -Follow H&H. -Transfusion threshold hemoglobin < 7.  3.  Newly diagnosed metastatic pancreatic  cancer -Patient being followed by oncology, oncology assessed patient and reviewed poor prognosis from oncology standpoint  with patient and recommending chemo option versus palliative care alone. -Patient noted to strongly want to try chemotherapy and not ready for hospice at this time, patient full code. -Patient seen by palliative care and pain regiment placed. -Continue MS Contin twice daily, oxycodone as needed for breakthrough pain, IV Dilaudid every 6 hours as needed as well as Xanax 0.5 mg twice daily. -Patient status post Port-A-Cath placement 02/07/2023.  -Per oncology. -Will need outpatient follow-up with palliative care and oncology.  4.  History of DVT -Eliquis.   5.  Hypertension -BP noted to be soft/borderline. -Home regimen antihypertensive medications on hold.   6.  Diabetes mellitus type 2 -Hemoglobin A1c 6.7  (12/06/2022) -Blood glucose at 124 off labs this morning.  -SSI.  7.  History of chronic hep C -Patient noted to have been treated. -Outpatient follow-up.  8.  Anxiety -Continue Xanax twice daily.   9.  AKI -Improved with hydration.   -Saline lock IV fluids.     DVT prophylaxis: Eliquis Code Status: Full Family Communication: Updated patient.  No family at bedside. Disposition: Likely home when clinically improved hopefully in the next 1 to 2 days..  Status is: Inpatient Remains inpatient appropriate because: Severity of illness   Consultants:  Interventional radiology: Dr. Serafina Royals 02/07/2023 Palliative care: Jobe Gibbon, NP 02/06/2023 Oncology: Dr. Burr Medico 02/06/2023   Procedures:  CT angiogram chest 02/06/2023 Chest x-ray 02/06/2023 Port-A-Cath placement 02/07/2023 per IR  Antimicrobials:  IV cefepime 02/06/2023>>>> 02/08/2023 IV Zosyn 215 2024 x 1 dose IV vancomycin 02/06/2023>>>> 02/07/2023 Transfused 2 units packed red blood cells 02/08/2023   Subjective: Laying in bed.  Affect somewhat flat today.  Less energetic.  Denies any chest pain or shortness of breath.  No abdominal pain.  States she is at home alone and unsure as to how safe it is for her to  go back home.   Objective: Vitals:   02/07/23 2238 02/08/23 0753 02/08/23 1056 02/08/23 1132  BP: 132/78  124/71 108/71  Pulse: 100 (!) 104 89 88  Resp: 16  18 18  $ Temp: 98.7 F (37.1 C)  97.8 F (36.6 C) 98.4 F (36.9 C)  TempSrc: Oral  Oral Oral  SpO2: 99% (!) 88% 98% 98%  Weight:      Height:        Intake/Output Summary (Last 24 hours) at 02/08/2023 1334 Last data filed at 02/08/2023 1115 Gross per 24 hour  Intake 1920.18 ml  Output --  Net 1920.18 ml    Filed Weights   02/06/23 0930  Weight: 85.5 kg    Examination:  General exam: NAD. Respiratory system: CTAB.  No wheezes, no crackles, no rhonchi.  Fair air movement.  Speaking in full sentences.  Cardiovascular system: Regular rate rhythm no murmurs rubs or gallops.  No JVD.  No lower extremity edema.  Gastrointestinal system: Abdomen is soft, nontender, nondistended, positive bowel sounds.  No rebound.  No guarding. Central nervous system: Alert and oriented. No focal neurological deficits. Extremities: Symmetric 5 x 5 power. Skin: No rashes, lesions or ulcers Psychiatry: Judgement and insight appear normal. Mood & affect appropriate.     Data Reviewed: I have personally reviewed following labs and imaging studies  CBC: Recent Labs  Lab 02/02/23 0527 02/03/23 0512 02/04/23 0536 02/06/23 1011 02/07/23 0507 02/08/23 0435  WBC 7.6 9.6 9.0 12.7* 8.5 7.4  NEUTROABS 5.8 6.0 6.8 11.0*  --   --  HGB 8.6* 9.4* 8.7* 8.6* 7.2* 6.7*  HCT 27.4* 30.2* 28.0* 28.5* 24.3* 21.6*  MCV 89.8 90.7 89.7 91.6 92.7 91.1  PLT 196 231 201 253 193 173     Basic Metabolic Panel: Recent Labs  Lab 02/02/23 0527 02/03/23 0512 02/04/23 0536 02/06/23 1011 02/07/23 0507 02/08/23 0435  NA 138 137 134* 137 136 137  K 4.1 4.5 3.9 3.9 3.7 3.3*  CL 99 98 96* 97* 101 104  CO2 29 29 26 25 23 24  $ GLUCOSE 166* 155* 186* 235* 136* 124*  BUN 26* 20 19 24* 25* 20  CREATININE 0.87 0.93 0.89 1.38* 1.24* 1.03*  CALCIUM 8.8* 8.9  8.3* 8.4* 8.0* 7.7*  MG 2.0 2.3 2.0  --   --   --   PHOS 3.9 4.1 3.6  --   --   --      GFR: Estimated Creatinine Clearance: 50.7 mL/min (A) (by C-G formula based on SCr of 1.03 mg/dL (H)).  Liver Function Tests: Recent Labs  Lab 02/02/23 0527 02/03/23 0512 02/04/23 0536 02/06/23 1011  AST 89* 95* 85* 90*  ALT 59* 66* 65* 61*  ALKPHOS 222* 245* 255* 243*  BILITOT 1.0 1.3* 1.1 1.3*  PROT 6.4* 7.7 6.8 7.0  ALBUMIN 2.7* 3.0* 2.7* 2.6*     CBG: Recent Labs  Lab 02/07/23 0735 02/07/23 1148 02/07/23 1632 02/07/23 1910 02/07/23 2220  GLUCAP 142* 138* 181* 117* 152*      Recent Results (from the past 240 hour(s))  Blood culture (routine x 2)     Status: None (Preliminary result)   Collection Time: 02/06/23 10:12 AM   Specimen: BLOOD  Result Value Ref Range Status   Specimen Description   Final    BLOOD BLOOD LEFT ARM Performed at [facility], [place] [address] [place], [place] [address]    Special Requests   Final    BOTTLES DRAWN AEROBIC AND ANAEROBIC Blood Culture results may not be optimal due to an inadequate volume of blood received in culture bottles Performed at [place] [facility] [address], [place], [place] [address]    Culture   Final    NO GROWTH 2 DAYS Performed at [facility] [address], [place], [place] [address]    Report Status PENDING  Incomplete  Blood culture (routine x 2)     Status: None (Preliminary result)   Collection Time: 02/06/23 10:12 AM   Specimen: BLOOD  Result Value Ref Range Status   Specimen Description   Final    BLOOD LEFT ANTECUBITAL Performed at [place] [address] [place], [place] [address]    Special Requests   Final    BOTTLES DRAWN AEROBIC AND ANAEROBIC Blood Culture results may not be optimal due to an inadequate volume of blood received in culture bottles Performed at [facility] [address], [place], [place]  [address]    Culture   Final    NO GROWTH 2 DAYS Performed at [facility] [address], [place], [place] [address]    Report Status PENDING  Incomplete  Surgical pcr screen     Status: None   Collection Time: 02/07/23  6:28 AM   Specimen: Nasal Mucosa; Nasal Swab  Result Value Ref Range Status   MRSA, PCR NEGATIVE NEGATIVE Final   Staphylococcus aureus NEGATIVE NEGATIVE Final    Comment: (NOTE) The Xpert SA Assay (FDA approved for NASAL specimens in patients 22  years of age and older), is one component of a comprehensive surveillance program. It is not intended to diagnose infection nor to guide or monitor treatment. Performed at [facility], [place] [address], [place], [place] [address]          Radiology Studies: IR IMAGING GUIDED PORT INSERTION  Result Date: 02/07/2023 INDICATION: 82 year old female with history of pancreatic cancer requiring central venous access for chemotherapy administration. EXAM: IMPLANTED PORT A CATH PLACEMENT WITH ULTRASOUND AND FLUOROSCOPIC GUIDANCE COMPARISON:  None Available. MEDICATIONS: None. ANESTHESIA/SEDATION: Moderate (conscious) sedation was employed during this procedure. A total of Versed 2 mg and Fentanyl 100 mcg was administered intravenously. Moderate Sedation Time: 14 minutes. The patient's level of consciousness and vital signs were monitored continuously by radiology nursing throughout the procedure under my direct supervision. CONTRAST:  None FLUOROSCOPY TIME:  Two mGy COMPLICATIONS: None immediate. PROCEDURE: The procedure, risks, benefits, and alternatives were explained to the patient. Questions regarding the procedure were encouraged and answered. The patient understands and consents to the procedure. The right neck and chest were prepped with chlorhexidine in a sterile fashion, and a sterile drape was applied covering the operative field. Maximum barrier sterile technique with sterile gowns and gloves were used  for the procedure. A timeout was performed prior to the initiation of the procedure. Ultrasound was used to examine the jugular vein which was compressible and free of internal echoes. A skin marker was used to demarcate the planned venotomy and port pocket incision sites. Local anesthesia was provided to these sites and the subcutaneous tunnel track with 1% lidocaine with 1:100,000 epinephrine. A small incision was created at the jugular access site and blunt dissection was performed of the subcutaneous tissues. Under ultrasound guidance, the jugular vein was accessed with a 21 ga micropuncture needle and an 0.018" wire was inserted to the superior vena cava. Real-time ultrasound guidance was utilized for vascular access including the acquisition of a permanent ultrasound image documenting patency of the accessed vessel. A 5 Fr micopuncture set was then used, through which a 0.035" Rosen wire was passed under fluoroscopic guidance into the inferior vena cava. An 8 Fr dilator was then placed over the wire. A subcutaneous port pocket was then created along the upper chest wall utilizing a combination of sharp and blunt dissection. The pocket was irrigated with sterile saline, packed with gauze, and observed for hemorrhage. A single lumen "ISP" sized power injectable port was chosen for placement. The 8 Fr catheter was tunneled from the port pocket site to the venotomy incision. The port was placed in the pocket. The external catheter was trimmed to appropriate length. The dilator was exchanged for an 8 Fr peel-away sheath under fluoroscopic guidance. The catheter was then placed through the sheath and the sheath was removed. Final catheter positioning was confirmed and documented with a fluoroscopic spot radiograph. The port was accessed with a Huber needle, aspirated, and flushed with heparinized saline. The deep dermal layer of the port pocket incision was closed with interrupted 3-0 Vicryl suture. Dermabond was  then placed over the port pocket and neck incisions. The patient tolerated the procedure well without immediate post procedural complication. FINDINGS: After catheter placement, the tip lies within the superior cavoatrial junction. The catheter aspirates and flushes normally and is ready for immediate use. IMPRESSION: Successful placement of a power injectable Port-A-Cath via the right internal jugular vein. The catheter is ready for immediate use. Ruthann Cancer, MD [facility] Radiology Electronically Signed  By: Ruthann Cancer M.D.   On: 02/07/2023 14:18        Scheduled Meds:  apixaban  5 mg Oral BID   Chlorhexidine Gluconate Cloth  6 each Topical Daily   ferrous sulfate  325 mg Oral Daily   furosemide  20 mg Intravenous Once   insulin aspart  0-15 Units Subcutaneous TID WC   insulin aspart  0-5 Units Subcutaneous QHS   loratadine  10 mg Oral Daily   morphine  15 mg Oral Q12H   pantoprazole  40 mg Oral QAC breakfast   polyethylene glycol  17 g Oral Daily   senna-docusate  1 tablet Oral QHS   Continuous Infusions:  sodium chloride 50 mL/hr at 02/08/23 0600   sodium chloride     ceFEPime (MAXIPIME) IV 2 g (02/08/23 0916)     LOS: 2 days    Time spent: 35 minutes    Irine Seal, MD [facility]   To contact the attending provider between 7A-7P or the covering provider during after hours 7P-7A, please log into the web site [email or web address] and access using universal [place] password for that web site. If you do not have the password, please call the hospital operator.  02/08/2023, 1:34 PM

## 2023-02-09 ENCOUNTER — Other Ambulatory Visit: Payer: Self-pay

## 2023-02-09 DIAGNOSIS — Z7189 Other specified counseling: Secondary | ICD-10-CM | POA: Diagnosis not present

## 2023-02-09 DIAGNOSIS — C799 Secondary malignant neoplasm of unspecified site: Secondary | ICD-10-CM | POA: Diagnosis not present

## 2023-02-09 DIAGNOSIS — J189 Pneumonia, unspecified organism: Secondary | ICD-10-CM | POA: Diagnosis not present

## 2023-02-09 DIAGNOSIS — C259 Malignant neoplasm of pancreas, unspecified: Secondary | ICD-10-CM | POA: Diagnosis not present

## 2023-02-09 LAB — CBC
HCT: 30.3 % — ABNORMAL LOW (ref 36.0–46.0)
Hemoglobin: 9.6 g/dL — ABNORMAL LOW (ref 12.0–15.0)
MCH: 28.2 pg (ref 26.0–34.0)
MCHC: 31.7 g/dL (ref 30.0–36.0)
MCV: 88.9 fL (ref 80.0–100.0)
Platelets: 169 10*3/uL (ref 150–400)
RBC: 3.41 MIL/uL — ABNORMAL LOW (ref 3.87–5.11)
RDW: 17.1 % — ABNORMAL HIGH (ref 11.5–15.5)
WBC: 7.6 10*3/uL (ref 4.0–10.5)
nRBC: 0 % (ref 0.0–0.2)

## 2023-02-09 LAB — BASIC METABOLIC PANEL
Anion gap: 10 (ref 5–15)
BUN: 16 mg/dL (ref 8–23)
CO2: 24 mmol/L (ref 22–32)
Calcium: 7.9 mg/dL — ABNORMAL LOW (ref 8.9–10.3)
Chloride: 104 mmol/L (ref 98–111)
Creatinine, Ser: 0.77 mg/dL (ref 0.44–1.00)
GFR, Estimated: >60 mL/min (ref >60)
Glucose, Bld: 147 mg/dL — ABNORMAL HIGH (ref 70–99)
Potassium: 3.5 mmol/L (ref 3.5–5.1)
Sodium: 138 mmol/L (ref 135–145)

## 2023-02-09 MED ORDER — POTASSIUM CHLORIDE CRYS ER 10 MEQ PO TBCR
40.0000 meq | EXTENDED_RELEASE_TABLET | Freq: Once | ORAL | Status: AC
  Administered 2023-02-09: 40 meq via ORAL
  Filled 2023-02-09: qty 4

## 2023-02-09 NOTE — Progress Notes (Signed)
Two large zip lock bags of pt's home meds (including her bottle of Oxycodone) sent home w pt's family member. MD and CN made aware of situation and explained to pt that our goals are to provide her with safe pt care. Pt verbalized understanding.

## 2023-02-09 NOTE — Progress Notes (Signed)
PROGRESS NOTE    Victoria [name]  [id number] DOB: [birth year] DOA: 02/06/2023 PCP: Elby Showers, MD    Chief Complaint  Patient presents with   Weakness    Brief Narrative:  HPI per Dr. Lonni Fix is a 82 y.o. female with medical history significant for tree of DVT and PE on Eliquis, insulin-dependent type 2 diabetes, pancreatic cancer and recent hospital stay for multifocal community-acquired pneumonia who is being admitted to the hospital with recurrent hypoxic respiratory failure and concern for healthcare acquired pneumonia.  Patient was discharged from this hospital on 2/13 after a stay during which she was treated with empiric IV azithromycin and Rocephin for 5 days.  Patient improved, was stable on room air and discharged home in stable condition yesterday.  Of note, the day prior to discharge from the hospital patient had some hypoxic respiratory failure was seen by pulmonary who did not recommend any changes in management.  She has actually been feeling pretty well, denies any chest pain, fevers, nausea, vomiting.  She came to [facility] today for palliative care outpatient consultation, and chest port placement with radiology.  However she was noted to be saturating in the 80s on room air, and sent to the emergency department for evaluation.   ED Course: Patient was evaluated in the emergency department, he had reportedly been saturating in the 80s on room air in the outpatient clinic.  Was started on 2 L nasal cannula oxygen now saturating 99%.  Lab work reveals new leukocytosis of 13,000, hemoglobin stable at 8.6 platelets 253.  Creatinine up from normal baseline to 1.38.  Patient was given empiric IV Zosyn for suspected healthcare acquired pneumonia, and hospitalist contacted for admission.  Assessment & Plan:   Principal Problem:   HCAP (healthcare-associated pneumonia) Active Problems:   DM2 (diabetes mellitus, type 2) ([place])   Hypertension    Hepatitis C, chronic (HCC)   Pancreatic cancer ([place])   History of DVT (deep vein thrombosis)   Palliative care patient   AKI (acute kidney injury) ([place])   Metastatic malignant neoplasm ([place])  #1 hypoxia/HCAP -Patient admitted with hypoxia noted to have sats of 80% on room air in the outpatient clinic setting and started on 2 L nasal cannula. -CBC done on admission with a leukocytosis white count of 13,000, hemoglobin noted to be stable at 8.6, chest x-ray done with diffuse bilateral interstitial pulmonary opacity, consistent with edema or atypical/viral infection.  No new or focal airspace opacity. -CT angiogram chest no PE, aneurysm or dissection.  Extensive nodular interstitial process throughout the lungs consistent with atypical infection versus metastatic disease with an definite interval change.  Nonspecific mildly prominent mediastinal nodes.  Pancreatic head mass that was not fully imaged on this study. -Patient's hypoxia likely secondary to metastatic disease from pancreatic cancer. -Patient recently hospitalized and treated empirically with antibiotics for pneumonia on 2 recent separate occasions.  Patient also treated for volume overload. -Patient afebrile.  Denies any significant productive cough. -Urine strep pneumococcus antigen negative. -Urine Legionella antigen pending. -MRSA PCR negative. -IV vancomycin, IV cefepime discontinued and monitoring patient off antibiotics.   -Patient likely needs home O2 on discharge. -Will need outpatient follow-up with PCP and hematology/oncology.  2.  Anemia of chronic disease -Patient with no overt bleeding, hemoglobin currently at 9.6 from 6.7  from 7.2 from 8.6 on admission. -Status post transfusion 2 units packed red blood cells 02/08/2023. -Anemia panel done 01/24/2023 with iron level of 87, TIBC of 395. -  Follow H&H. -Transfusion threshold hemoglobin < 7.  3.  Newly diagnosed metastatic pancreatic cancer -Patient being followed by  oncology, oncology assessed patient and reviewed poor prognosis from oncology standpoint with patient and recommending chemo option versus palliative care alone. -Patient noted to strongly want to try chemotherapy and not ready for hospice at this time, patient full code. -Patient seen by palliative care and pain regiment placed and patient at this time wanting full scope of care.. -Continue MS Contin twice daily, oxycodone as needed for breakthrough pain, IV Dilaudid every 6 hours as needed as well as Xanax 0.5 mg twice daily. -Patient status post Port-A-Cath placement 02/07/2023.  -Per oncology. -Will need outpatient follow-up with palliative care and oncology.  4.  History of DVT -Continue Eliquis.   5.  Hypertension -BP noted to be soft/borderline early on on presentation.. -Home regimen antihypertensive medications on hold.    6.  Diabetes mellitus type 2 -Hemoglobin A1c 6.7  (12/06/2022) -Blood glucose at 147 from labs this morning.  -SSI.  7.  History of chronic hep C -Patient noted to have been treated. -Outpatient follow-up.  8.  Anxiety -Continue Xanax twice daily.   9.  AKI -Improved with hydration.   -IV fluids saline lock.      DVT prophylaxis: Eliquis Code Status: Full Family Communication: Updated patient.  No family at bedside. Disposition: Likely home when clinically improved hopefully in the next 1 to 2 days.  Status is: Inpatient Remains inpatient appropriate because: Severity of illness   Consultants:  Interventional radiology: Dr. Serafina Royals 02/07/2023 Palliative care: Jobe Gibbon, NP 02/06/2023 Oncology: Dr. Burr Medico 02/06/2023   Procedures:  CT angiogram chest 02/06/2023 Chest x-ray 02/06/2023 Port-A-Cath placement 02/07/2023 per IR  Antimicrobials:  IV cefepime 02/06/2023>>>> 02/08/2023 IV Zosyn 215 2024 x 1 dose IV vancomycin 02/06/2023>>>> 02/07/2023 Transfused 2 units packed red blood cells 02/08/2023   Subjective: Laying in bed.   States she feels much better today.  Affect improved.  No chest pain.  No shortness of breath.  No abdominal pain.  Stated had a few bloody nasal discharge.  Per RN patient noted to have 2 large bags of her home medications in the room.  Objective: Vitals:   02/08/23 2130 02/09/23 0606 02/09/23 0800 02/09/23 1406  BP: (!) 120/106 138/87  (!) 147/85  Pulse: 95 (!) 101  91  Resp: 18 18    Temp: 98.7 F (37.1 C) 98.8 F (37.1 C)  98.6 F (37 C)  TempSrc: Oral Oral  Oral  SpO2: 99% 92% 93% 92%  Weight:      Height:        Intake/Output Summary (Last 24 hours) at 02/09/2023 1629 Last data filed at 02/09/2023 0600 Gross per 24 hour  Intake 750 ml  Output 300 ml  Net 450 ml    Filed Weights   02/06/23 0930  Weight: 85.5 kg    Examination:  General exam: NAD. Respiratory system: Lungs clear to auscultation bilaterally.  No wheezes, no crackles, no rhonchi.  Fair air movement.  Speaking in full sentences.   Cardiovascular system: RRR no murmurs rubs or gallops.  No JVD.  No lower extremity edema.   Gastrointestinal system: Abdomen is soft, nontender, nondistended, positive bowel sounds.  No rebound.  No guarding.   Central nervous system: Alert and oriented. No focal neurological deficits. Extremities: Symmetric 5 x 5 power. Skin: No rashes, lesions or ulcers Psychiatry: Judgement and insight appear normal. Mood & affect appropriate.     Data Reviewed:  I have personally reviewed following labs and imaging studies  CBC: Recent Labs  Lab 02/03/23 0512 02/04/23 0536 02/06/23 1011 02/07/23 0507 02/08/23 0435 02/09/23 0252  WBC 9.6 9.0 12.7* 8.5 7.4 7.6  NEUTROABS 6.0 6.8 11.0*  --   --   --   HGB 9.4* 8.7* 8.6* 7.2* 6.7* 9.6*  HCT 30.2* 28.0* 28.5* 24.3* 21.6* 30.3*  MCV 90.7 89.7 91.6 92.7 91.1 88.9  PLT 231 201 253 193 173 169     Basic Metabolic Panel: Recent Labs  Lab 02/03/23 0512 02/04/23 0536 02/06/23 1011 02/07/23 0507 02/08/23 0435 02/09/23 0252  NA  137 134* 137 136 137 138  K 4.5 3.9 3.9 3.7 3.3* 3.5  CL 98 96* 97* 101 104 104  CO2 29 26 25 23 24 24  $ GLUCOSE 155* 186* 235* 136* 124* 147*  BUN 20 19 24* 25* 20 16  CREATININE 0.93 0.89 1.38* 1.24* 1.03* 0.77  CALCIUM 8.9 8.3* 8.4* 8.0* 7.7* 7.9*  MG 2.3 2.0  --   --   --   --   PHOS 4.1 3.6  --   --   --   --      GFR: Estimated Creatinine Clearance: 65.3 mL/min (by C-G formula based on SCr of 0.77 mg/dL).  Liver Function Tests: Recent Labs  Lab 02/03/23 0512 02/04/23 0536 02/06/23 1011  AST 95* 85* 90*  ALT 66* 65* 61*  ALKPHOS 245* 255* 243*  BILITOT 1.3* 1.1 1.3*  PROT 7.7 6.8 7.0  ALBUMIN 3.0* 2.7* 2.6*     CBG: Recent Labs  Lab 02/07/23 0735 02/07/23 1148 02/07/23 1632 02/07/23 1910 02/07/23 2220  GLUCAP 142* 138* 181* 117* 152*      Recent Results (from the past 240 hour(s))  Blood culture (routine x 2)     Status: None (Preliminary result)   Collection Time: 02/06/23 10:12 AM   Specimen: BLOOD  Result Value Ref Range Status   Specimen Description   Final    BLOOD BLOOD LEFT ARM Performed at [facility], [place] [address], [place], [place] [address]    Special Requests   Final    BOTTLES DRAWN AEROBIC AND ANAEROBIC Blood Culture results may not be optimal due to an inadequate volume of blood received in culture bottles Performed at [facility] [address] [place], [place] [address]    Culture   Final    NO GROWTH 3 DAYS Performed at [facility], [place] [address] [place], [place] [address]    Report Status PENDING  Incomplete  Blood culture (routine x 2)     Status: None (Preliminary result)   Collection Time: 02/06/23 10:12 AM   Specimen: BLOOD  Result Value Ref Range Status   Specimen Description   Final    BLOOD LEFT ANTECUBITAL Performed at [facility] [address] [place], [place] [address]    Special Requests   Final    BOTTLES DRAWN AEROBIC AND ANAEROBIC Blood  Culture results may not be optimal due to an inadequate volume of blood received in culture bottles Performed at [place] [address], [place], [place] [address]    Culture   Final    NO GROWTH 3 DAYS Performed at [facility], [place] [address], [place], [place] [address]    Report Status PENDING  Incomplete  Surgical pcr screen     Status: None   Collection Time: 02/07/23  6:28 AM  Specimen: Nasal Mucosa; Nasal Swab  Result Value Ref Range Status   MRSA, PCR NEGATIVE NEGATIVE Final   Staphylococcus aureus NEGATIVE NEGATIVE Final    Comment: (NOTE) The Xpert SA Assay (FDA approved for NASAL specimens in patients 32 years of age and older), is one component of a comprehensive surveillance program. It is not intended to diagnose infection nor to guide or monitor treatment. Performed at [facility], [place] [address] [place], [place] [address]          Radiology Studies: No results found.      Scheduled Meds:  apixaban  5 mg Oral BID   Chlorhexidine Gluconate Cloth  6 each Topical Daily   ferrous sulfate  325 mg Oral Daily   insulin aspart  0-15 Units Subcutaneous TID WC   insulin aspart  0-5 Units Subcutaneous QHS   loratadine  10 mg Oral Daily   morphine  15 mg Oral Q12H   pantoprazole  40 mg Oral QAC breakfast   polyethylene glycol  17 g Oral Daily   senna-docusate  1 tablet Oral QHS   Continuous Infusions:  sodium chloride       LOS: 3 days    Time spent: 35 minutes    Irine Seal, MD [facility]   To contact the attending provider between 7A-7P or the covering provider during after hours 7P-7A, please log into the web site [email or web address] and access using universal [place] password for that web site. If you do not have the password, please call the hospital operator.  02/09/2023, 4:29 PM

## 2023-02-09 NOTE — Progress Notes (Signed)
PT Cancellation Note  Patient Details Name: SHERRIKA RILEY MRN: [id number] DOB: 1941/11/10   Cancelled Treatment:     PT order received but eval deferred.  Pt observed on hall multiple times this pm ambulating unassisted including maneuvering IV pole, adjusting gowns, talking with nursing and all unassisted with no noted instability or LOB.  PT service will sign off at this time.   Jayonna Meyering 02/09/2023, 4:26 PM

## 2023-02-09 NOTE — Progress Notes (Signed)
SATURATION QUALIFICATIONS: (This note is used to comply with regulatory documentation for home oxygen)  Patient Saturations on Room Air at Rest = 91%  Patient Saturations on Room Air while Ambulating = 85%  Patient Saturations on 2 Liters of oxygen while Ambulating = 88%  Please briefly explain why patient needs home oxygen:

## 2023-02-09 NOTE — Evaluation (Signed)
Occupational Therapy Evaluation Patient Details Name: Victoria [name] MRN: [id number] DOB: [birth year] Today's Date: 02/09/2023   History of Present Illness Patient is a 82 year old female who presented on 02/06/23  shortness of breath. Patient was admitted with recurrent respiratory failure with hypoxia, and concerns for CAP. PMH: recent d/c on 2/14 home with family support. DM II, DVT/PE, hepatitis C liver cirrhosis, normocytic anemia, pancreatic cancer   Clinical Impression   Patient is a 82 year old female who was admitted for above. Patient was recently d/c from hospital on 2/14 with recommendations for intermittent support from family. Patient reported she was at home alone after d/c from hospital. Patient was noted to have significantly decreased functional activity tolerance compared to last evaluation with increased edema in LUE and increased need for supplemental O2. Patient was noted to drop to 86% on RA with HR of 123bpm with toileting tasks with need for 2L/min to deep breath O2 back above 90%. Patient is motivated to increase ability to engage in ADLs. Patient would continue to benefit from skilled OT services at this time while admitted and after d/c to address noted deficits in order to improve overall safety and independence in ADLs.       Recommendations for follow up therapy are one component of a multi-disciplinary discharge planning process, led by the attending physician.  Recommendations may be updated based on patient status, additional functional criteria and insurance authorization.   Follow Up Recommendations  Home health OT     Assistance Recommended at Discharge Frequent or constant Supervision/Assistance  Patient can return home with the following A little help with bathing/dressing/bathroom;Direct supervision/assist for financial management;Help with stairs or ramp for entrance;Assist for transportation;Direct supervision/assist for medications management;Assistance  with cooking/housework    Functional Status Assessment  Patient has had a recent decline in their functional status and demonstrates the ability to make significant improvements in function in a reasonable and predictable amount of time.  Equipment Recommendations  None recommended by OT       Precautions / Restrictions Precautions Precautions: Fall Precaution Comments: monitor O2 Restrictions Weight Bearing Restrictions: No      Mobility Bed Mobility Overal bed mobility: Modified Independent          Balance Overall balance assessment: Mild deficits observed, not formally tested           ADL either performed or assessed with clinical judgement   ADL Overall ADL's : Needs assistance/impaired Eating/Feeding: Modified independent;Sitting   Grooming: Sitting;Set up   Upper Body Bathing: Set up;Sitting   Lower Body Bathing: Set up;Sitting/lateral leans   Upper Body Dressing : Set up;Sitting   Lower Body Dressing: Set up;Sitting/lateral leans   Toilet Transfer: Min guard Toilet Transfer Details (indicate cue type and reason): to stand and transfer from bed to [facility]. O2 noted to drop to 86% on RA with HR increased to 123bpm. patient able to bring O2 back up to 93% on 2L/min when seated within 30 seconds. patient was educated on O2 test. patient verbalized understanding. Toileting- Clothing Manipulation and Hygiene: Min guard;Sitting/lateral lean               Vision Patient Visual Report: No change from baseline              Pertinent Vitals/Pain Pain Assessment Pain Assessment: Faces Faces Pain Scale: Hurts little more Pain Location: abdomen Pain Descriptors / Indicators: Discomfort, Grimacing Pain Intervention(s): Limited activity within patient's tolerance, Monitored during session, Premedicated before session  Hand Dominance Right   Extremity/Trunk Assessment Upper Extremity Assessment Upper Extremity Assessment: Overall WFL for tasks  assessed (noted to have edmea +1 in LUE)   Lower Extremity Assessment Lower Extremity Assessment: Defer to PT evaluation   Cervical / Trunk Assessment Cervical / Trunk Assessment: Normal   Communication Communication Communication: No difficulties   Cognition Arousal/Alertness: Lethargic Behavior During Therapy: Flat affect     General Comments: patient was noted to have some confusion today with patient still appropirate during session. patient was lethargic during session with patient reporting pain in abdomen.     General Comments  noted to have edema in LUE +1 educated on keeping UE elevated on pillow. patient verbalzied understanding.            Home Living Family/patient expects to be discharged to:: Private residence Living Arrangements: Other relatives;Children Available Help at Discharge: Family Type of Home: Other(Comment) (townhouse) Home Access: Level entry     Home Layout: One level     Bathroom Shower/Tub: Teacher, early years/pre: Standard     Home Equipment: Shower seat          Prior Functioning/Environment Prior Level of Function : Independent/Modified Independent;Driving             Mobility Comments: no AD ADLs Comments: Independent in ADL and IADL, walks daily, takes granddaughter to school. patient reported family would assist her at d/c.        OT Problem List: Decreased activity tolerance;Impaired balance (sitting and/or standing);Decreased knowledge of use of DME or AE;Decreased knowledge of precautions;Decreased safety awareness;Increased edema      OT Treatment/Interventions: Self-care/ADL training;Energy conservation;Therapeutic exercise;DME and/or AE instruction;Therapeutic activities;Patient/family education;Balance training    OT Goals(Current goals can be found in the care plan section) Acute Rehab OT Goals Patient Stated Goal: to get back home soon OT Goal Formulation: With patient Time For Goal Achievement:  02/23/23 Potential to Achieve Goals: Fair  OT Frequency: Min 2X/week       AM-PAC OT "6 Clicks" Daily Activity     Outcome Measure Help from another person eating meals?: None Help from another person taking care of personal grooming?: None Help from another person toileting, which includes using toliet, bedpan, or urinal?: A Little Help from another person bathing (including washing, rinsing, drying)?: A Little Help from another person to put on and taking off regular upper body clothing?: A Little Help from another person to put on and taking off regular lower body clothing?: A Little 6 Click Score: 20   End of Session Equipment Utilized During Treatment: Other (comment) (BSC) Nurse Communication: Other (comment) (ok to see patient, educated on O2 levels during session)  Activity Tolerance: Patient tolerated treatment well Patient left: in bed;with call bell/phone within reach  OT Visit Diagnosis: Unsteadiness on feet (R26.81);Other abnormalities of gait and mobility (R26.89)                Time: [id number] OT Time Calculation (min): 19 min Charges:  OT General Charges $OT Visit: 1 Visit OT Evaluation $OT Eval Moderate Complexity: 1 Mod  Leanne Sisler OTR/L, MS Acute Rehabilitation Department Office# [phone]   Willa Rough 02/09/2023, 10:42 AM

## 2023-02-10 DIAGNOSIS — Z7189 Other specified counseling: Secondary | ICD-10-CM | POA: Diagnosis not present

## 2023-02-10 DIAGNOSIS — Z79899 Other long term (current) drug therapy: Secondary | ICD-10-CM

## 2023-02-10 DIAGNOSIS — J189 Pneumonia, unspecified organism: Secondary | ICD-10-CM | POA: Diagnosis not present

## 2023-02-10 DIAGNOSIS — Z515 Encounter for palliative care: Secondary | ICD-10-CM | POA: Diagnosis not present

## 2023-02-10 DIAGNOSIS — F419 Anxiety disorder, unspecified: Secondary | ICD-10-CM

## 2023-02-10 DIAGNOSIS — G893 Neoplasm related pain (acute) (chronic): Secondary | ICD-10-CM

## 2023-02-10 DIAGNOSIS — C259 Malignant neoplasm of pancreas, unspecified: Secondary | ICD-10-CM | POA: Diagnosis not present

## 2023-02-10 DIAGNOSIS — C799 Secondary malignant neoplasm of unspecified site: Secondary | ICD-10-CM | POA: Diagnosis not present

## 2023-02-10 LAB — CBC
HCT: 31 % — ABNORMAL LOW (ref 36.0–46.0)
Hemoglobin: 9.9 g/dL — ABNORMAL LOW (ref 12.0–15.0)
MCH: 28.5 pg (ref 26.0–34.0)
MCHC: 31.9 g/dL (ref 30.0–36.0)
MCV: 89.3 fL (ref 80.0–100.0)
Platelets: 191 10*3/uL (ref 150–400)
RBC: 3.47 MIL/uL — ABNORMAL LOW (ref 3.87–5.11)
RDW: 17.5 % — ABNORMAL HIGH (ref 11.5–15.5)
WBC: 8.2 10*3/uL (ref 4.0–10.5)
nRBC: 0 % (ref 0.0–0.2)

## 2023-02-10 LAB — BPAM RBC
Blood Product Expiration Date: 202403062359
Blood Product Expiration Date: 202403182359
ISSUE DATE / TIME: 202402171107
ISSUE DATE / TIME: 202402171745
Unit Type and Rh: 5100
Unit Type and Rh: 5100

## 2023-02-10 LAB — TYPE AND SCREEN
ABO/RH(D): O POS
Antibody Screen: NEGATIVE
Unit division: 0
Unit division: 0

## 2023-02-10 LAB — BASIC METABOLIC PANEL
Anion gap: 7 (ref 5–15)
BUN: 12 mg/dL (ref 8–23)
CO2: 25 mmol/L (ref 22–32)
Calcium: 7.9 mg/dL — ABNORMAL LOW (ref 8.9–10.3)
Chloride: 104 mmol/L (ref 98–111)
Creatinine, Ser: 0.82 mg/dL (ref 0.44–1.00)
GFR, Estimated: >60 mL/min (ref >60)
Glucose, Bld: 130 mg/dL — ABNORMAL HIGH (ref 70–99)
Potassium: 4 mmol/L (ref 3.5–5.1)
Sodium: 136 mmol/L (ref 135–145)

## 2023-02-10 LAB — LEGIONELLA PNEUMOPHILA SEROGP 1 UR AG: L. pneumophila Serogp 1 Ur Ag: NEGATIVE

## 2023-02-10 LAB — GLUCOSE, CAPILLARY
Glucose-Capillary: 146 mg/dL — ABNORMAL HIGH (ref 70–99)
Glucose-Capillary: 159 mg/dL — ABNORMAL HIGH (ref 70–99)
Glucose-Capillary: 221 mg/dL — ABNORMAL HIGH (ref 70–99)
Glucose-Capillary: 167 mg/dL — ABNORMAL HIGH (ref 70–99)
Glucose-Capillary: 177 mg/dL — ABNORMAL HIGH (ref 70–99)
Glucose-Capillary: 158 mg/dL — ABNORMAL HIGH (ref 70–99)
Glucose-Capillary: 291 mg/dL — ABNORMAL HIGH (ref 70–99)
Glucose-Capillary: 161 mg/dL — ABNORMAL HIGH (ref 70–99)
Glucose-Capillary: 137 mg/dL — ABNORMAL HIGH (ref 70–99)
Glucose-Capillary: 212 mg/dL — ABNORMAL HIGH (ref 70–99)

## 2023-02-10 MED ORDER — HYDROMORPHONE HCL 1 MG/ML IJ SOLN
0.3000 mg | INTRAMUSCULAR | Status: DC | PRN
  Administered 2023-02-10 – 2023-02-12 (×2): 0.3 mg via INTRAVENOUS
  Filled 2023-02-10 (×2): qty 0.5

## 2023-02-10 MED ORDER — MORPHINE SULFATE ER 15 MG PO TBCR
15.0000 mg | EXTENDED_RELEASE_TABLET | Freq: Every day | ORAL | Status: DC
  Administered 2023-02-11 – 2023-02-12 (×2): 15 mg via ORAL
  Filled 2023-02-10 (×2): qty 1

## 2023-02-10 MED ORDER — ALPRAZOLAM 0.5 MG PO TABS
0.5000 mg | ORAL_TABLET | Freq: Two times a day (BID) | ORAL | Status: DC
  Administered 2023-02-10 – 2023-02-12 (×4): 0.5 mg via ORAL
  Filled 2023-02-10 (×4): qty 1

## 2023-02-10 MED ORDER — MORPHINE SULFATE ER 30 MG PO TBCR
30.0000 mg | EXTENDED_RELEASE_TABLET | Freq: Every evening | ORAL | Status: DC
  Administered 2023-02-10 – 2023-02-11 (×2): 30 mg via ORAL
  Filled 2023-02-10 (×2): qty 1

## 2023-02-10 MED ORDER — FUROSEMIDE 20 MG PO TABS
20.0000 mg | ORAL_TABLET | Freq: Every day | ORAL | Status: DC
  Administered 2023-02-10 – 2023-02-12 (×3): 20 mg via ORAL
  Filled 2023-02-10 (×3): qty 1

## 2023-02-10 MED ORDER — PHENOL 1.4 % MT LIQD
1.0000 | OROMUCOSAL | Status: DC | PRN
  Filled 2023-02-10: qty 177

## 2023-02-10 NOTE — Progress Notes (Signed)
Mobility Specialist - Progress Note   02/10/23 1009  Oxygen Therapy  O2 Device Nasal Cannula  O2 Flow Rate (L/min) 2 L/min  Mobility  Activity Ambulated with assistance in hallway  Level of Assistance Modified independent, requires aide device or extra time  Assistive Device Other (Comment) (IV Pole)  Distance Ambulated (ft) 275 ft  Activity Response Tolerated well  Mobility Referral Yes  $Mobility charge 1 Mobility   Pt received in bed and agreeable to mobility. Pt did state she was SOB in the middle of session, but still eager to ambulate. Pt O2 desat to 84% after walking 24f. Encouraged pt to take standing a rest break as well as some pursed lip breaths. Pt O2 back to 89% in ~264m. No complaints during session. Pt to bed after session with all needs met.    Pre-mobility: 111 HR, 92% SpO2 (2L [place]) During mobility: 126 HR, 84% SpO2 (2L [place]) Post-mobility: 122 HR, 90% SPO2 (2L [place])  MaSet designer

## 2023-02-10 NOTE — Progress Notes (Signed)
Mobility Specialist - Progress Note   02/10/23 1354  Oxygen Therapy  O2 Device Nasal Cannula  O2 Flow Rate (L/min) 2 L/min  Mobility  Activity Ambulated with assistance in hallway  Level of Assistance Modified independent, requires aide device or extra time  Assistive Device Other (Comment) (IV Pole)  Distance Ambulated (ft) 250 ft  Activity Response Tolerated well  Mobility Referral Yes  $Mobility charge 1 Mobility   Pt received in bed and agreeable to mobility. Pt desat to 87% while ambulating. Encouraged pt to take a standing rest break & do some pursed lip breaths. Pt to bed after session with all needs met.    Pre-mobility: 124 HR, 97% SpO2 During mobility: 136 HR, 87% SpO2 Post-mobility: 131 HR, 91% SPO2  Set designer

## 2023-02-10 NOTE — Progress Notes (Signed)
Daily Progress Note   Patient Name: Victoria [name]       Date: 02/10/2023 DOB: [birth year]  Age: 82 y.o. MRN#: [id number] Attending Physician: Eugenie Filler, MD Primary Care Physician: Elby Showers, MD Admit Date: 02/06/2023 Length of Stay: 4 days  Reason for Consultation/Follow-up: Establishing goals of care and Symptom Management  Subjective:   CC: Patient noting worsening abdominal pain at night which is uncontrolled by current medications.  Following up regarding symptom management.  Subjective:  Extensive review of EMR prior to seeing patient.  Patient continues to receive MS Contin 15 mg twice daily scheduled.  Patient receiving oxycodone 5-10 mg every 4 hours as needed for breakthrough; received 10 mg dose x 2 in the past 24 hours.  Patient also received IV Dilaudid 0.5 mg x 1 dose in the past 24 hours patient also receiving Ativan 0.5 mg twice daily as needed (getting twice a day).  Discussed care with other PMT provider prior to seeing patient.   Presented to bedside and introduced myself as a member of the palliative medicine team to patient.  Patient able to discuss her current symptom management.  Patient feels that her pain is not well-controlled at night.  Patient feels frustrated that she cannot receive the pain medications when she needs it.  When discussing this further, patient feels that she is not getting her medications like she was taking at home.  Patient proceeded to review all the medication she had received at home and what time she was taking them.  Inquired if patient felt the oxycodone was providing enough symptom management for breakthrough pain and patient stated she does not believe it is and she does not believe she is getting it when she is allowed to it.  Inquired if patient felt she needed to receive oxycodone at a different dose or frequency and patient does not believe she does, she just does not want it withheld.  Acknowledged this and we reviewed  patient's EMR and times documented she had been receiving medication.  Patient feels frustrated over this so provided empathetic listening as able.  We then developed a plan for symptom management moving forward which better matches patient's home regimen.  Patient will receive Ativan 0.5 mg twice daily scheduled at 9 AM and 9 PM.  Patient will continue to receive oxycodone 5-10 mg every 4 hours as needed for breakthrough management.  Patient will receive MS Contin 15 mg in a.m. at 10 AM.  Will increase MS Contin to 30 mg in evening at 10 PM to allow for better control of pain overnight to allow patient to rest.  Patient agreed with changes to regimen according to this.  Also discussed anxiety with patient as she appears overwhelmed.  Patient acknowledges feeling overwhelmed with all she has going on.  Patient noted though she did not want to rehash this and continue talking about it so acknowledged her feelings on this.  Did discuss the idea of transitioning her Ativan to Klonopin to help with longer lasting anxiety management the patient does not want to change her Ativan at this time.  Acknowledged this and that it could be changed in the future if she felt this would be beneficial.  All questions answered at that time.  Thanked patient for allowing me to visit with her today  Discussed care with hospitalist before and after visit.   Review of Systems Feels abdominal pain and night is uncontrolled. Objective:   Vital Signs:  BP (!) 152/102 (  BP Location: Left Wrist)   Pulse 95   Temp 98.2 F (36.8 C) (Oral)   Resp 16   Ht 5' 8"$  (1.727 m)   Wt 85.5 kg   SpO2 96%   BMI 28.66 kg/m   Physical Exam: General: NAD, alert, laying in bed Eyes: no drainage noted HENT: moist mucous membranes Cardiovascular: RRR Respiratory: no increased work of breathing noted, not in respiratory distress Abdomen: not distended Extremities: moving Ues appropriately  Skin: no rashes or lesions on visible  skin Neuro: A&Ox4, following commands easily Psych: anxious, frustrated  Imaging:  I personally reviewed recent imaging.   Assessment & Plan:   Assessment: Palliative Care consult requested for goals of care discussion in this 83 y.o. female  with past medical history of hepatitis C, right breast cancer s/p lumpectomy, type 2 diabetes, hypertension, thrombocytopenia, PE/DVT, and recent diagnosis of pancreatic cancer (12/2022) followed by Dr. Burr Medico with plans to initiate single agent treatment. Patient was seen during previous admission, discharged on 02/04/2023, now patient is currently admitted on 02/06/2023 with new hypoxia.   Recommendations/Plan: # Complex medical decision making/goals of care:  - Patient's goals for medical care have remained focused on full code/full scope of therapy.  -Continue with current plan of care per medical team. Patient has clearly expressed wishes to treat the treatable allowing her an opportunity to live. Is hopeful she will be able to pursue oncological treatment. Realistic in her understanding however also conflicted in son's acceptance. Encouraged ongoing discussions. Pmt has offered to facilitate additional discussions. Patient would like to continue to speak with him privately for now but would like to have family discussions in the future .  -  Code Status: Full Code  # Symptom management:  -Pain, acute in the setting of pancreatic cancer    -Continue MS Contin 21m at 10 AM daily   -Increase MS Contin to 354mat 10PM daily to help better control worsening nighttime pain   -Continue oxycodone 5-1013m4hrs prn for breakthrough pain   -Constipation   -Receiving Miralax 17gm and senna 1 tab daily   -Anxiety  Has received Xanax 0.5mg58mD prn x3 in the past two hours.Patient notes she normally takes this twice daily at home. Discussed Klonopin to provide long acting anxiety management though patient does not want to change current medication at this time.  Could consider outpatient.    -Schedule Xanax 0.5mg 81m9AM and 9PM daily.   # Discharge Planning: Home. Discussed with [facility] and likely discharge home tomorrow.   -Follow up with PMT at [facility] discharged.   Discussed with: hospitalist, patient, outpatient PMT provider  Thank you for allowing the palliative care team to participate in the care PatriJanann AugustureChelsea AusPalliative Care Provider PMT # [phone] remains symptomatic despite maximum doses, please call PMT at [phone] and [phone]. Outside of these hours, please call attending, as PMT does not have night coverage.

## 2023-02-10 NOTE — TOC CM/SW Note (Signed)
  Transition of Care [facility]) Screening Note   Patient Details  Name: Victoria [name] Date of Birth: Apr 30, 1941   Transition of Care [facility]) CM/SW Contact:    Lennart Pall, LCSW Phone Number: 02/10/2023, 10:16 AM    Transition of Care Department [facility]) has reviewed patient and no TOC needs have been identified at this time. We will continue to monitor patient advancement through interdisciplinary progression rounds. If new patient transition needs arise, please place a TOC consult.

## 2023-02-10 NOTE — Progress Notes (Signed)
PROGRESS NOTE    Victoria [name]  [id number] DOB: [birth year] DOA: 02/06/2023 PCP: Elby Showers, MD    Chief Complaint  Patient presents with   Weakness    Brief Narrative:  HPI per Dr. Lonni Fix is a 82 y.o. female with medical history significant for tree of DVT and PE on Eliquis, insulin-dependent type 2 diabetes, pancreatic cancer and recent hospital stay for multifocal community-acquired pneumonia who is being admitted to the hospital with recurrent hypoxic respiratory failure and concern for healthcare acquired pneumonia.  Patient was discharged from this hospital on 2/13 after a stay during which she was treated with empiric IV azithromycin and Rocephin for 5 days.  Patient improved, was stable on room air and discharged home in stable condition yesterday.  Of note, the day prior to discharge from the hospital patient had some hypoxic respiratory failure was seen by pulmonary who did not recommend any changes in management.  She has actually been feeling pretty well, denies any chest pain, fevers, nausea, vomiting.  She came to [facility] today for palliative care outpatient consultation, and chest port placement with radiology.  However she was noted to be saturating in the 80s on room air, and sent to the emergency department for evaluation.   ED Course: Patient was evaluated in the emergency department, he had reportedly been saturating in the 80s on room air in the outpatient clinic.  Was started on 2 L nasal cannula oxygen now saturating 99%.  Lab work reveals new leukocytosis of 13,000, hemoglobin stable at 8.6 platelets 253.  Creatinine up from normal baseline to 1.38.  Patient was given empiric IV Zosyn for suspected healthcare acquired pneumonia, and hospitalist contacted for admission.  Assessment & Plan:   Principal Problem:   HCAP (healthcare-associated pneumonia) Active Problems:   DM2 (diabetes mellitus, type 2) ([place])   Hypertension    Hepatitis C, chronic (HCC)   Pancreatic cancer ([place])   History of DVT (deep vein thrombosis)   Palliative care patient   AKI (acute kidney injury) ([place])   Metastatic malignant neoplasm ([place])   High risk medication use   Medication management   Counseling and coordination of care   Cancer associated pain   Palliative care encounter  #1 hypoxia/HCAP -Patient admitted with hypoxia noted to have sats of 80% on room air in the outpatient clinic setting and started on 2 L nasal cannula. -CBC done on admission with a leukocytosis white count of 13,000, hemoglobin noted to be stable at 8.6, chest x-ray done with diffuse bilateral interstitial pulmonary opacity, consistent with edema or atypical/viral infection.  No new or focal airspace opacity. -CT angiogram chest no PE, aneurysm or dissection.  Extensive nodular interstitial process throughout the lungs consistent with atypical infection versus metastatic disease with an definite interval change.  Nonspecific mildly prominent mediastinal nodes.  Pancreatic head mass that was not fully imaged on this study. -Patient's hypoxia likely secondary to metastatic disease from pancreatic cancer. -Patient recently hospitalized and treated empirically with antibiotics for pneumonia on 2 recent separate occasions.  Patient also treated for volume overload. -Patient afebrile.  Denies any significant productive cough. -Urine strep pneumococcus antigen negative. -Urine Legionella antigen pending. -MRSA PCR negative. -IV vancomycin, IV cefepime discontinued and monitoring patient off antibiotics.   -Patient likely needs home O2 on discharge. -Will need outpatient follow-up with PCP and hematology/oncology.  2.  Anemia of chronic disease -Patient with no overt bleeding, hemoglobin currently at 9.9 from 9.6 from 6.7  from 7.2 from 8.6 on admission. -Status post transfusion 2 units packed red blood cells 02/08/2023. -Anemia panel done 01/24/2023 with iron level of  87, TIBC of 395. -Follow H&H. -Transfusion threshold hemoglobin < 7.  3.  Newly diagnosed metastatic pancreatic cancer -Patient being followed by oncology, oncology assessed patient and reviewed poor prognosis from oncology standpoint with patient and recommending chemo option versus palliative care alone. -Patient noted to strongly want to try chemotherapy and not ready for hospice at this time, patient full code. -Patient seen by palliative care and pain regiment placed and patient at this time wanting full scope of care.. -Continue MS Contin twice daily, oxycodone as needed for breakthrough pain, IV Dilaudid every 6 hours as needed as well as Xanax 0.5 mg twice daily. -Patient status post Port-A-Cath placement 02/07/2023.  -Per oncology. -Will need outpatient follow-up with palliative care and oncology. -Palliative care following and adjusting pain regiment for better pain control.  4.  History of DVT -Eliquis.  5.  Hypertension -BP noted to be soft/borderline early on on presentation.. -Home regimen antihypertensive medications on hold.  -Resume home regimen Lasix.   6.  Diabetes mellitus type 2 -Hemoglobin A1c 6.7  (12/06/2022) -Blood glucose at 137 this morning.  -SSI.  7.  History of chronic hep C -Patient noted to have been treated. -Outpatient follow-up.  8.  Anxiety -Continue Xanax twice daily as needed.   -Palliative care following and managing anxiolytics.   9.  AKI -Improved with hydration.   -IV fluids saline lock.      DVT prophylaxis: Eliquis Code Status: Full Family Communication: Updated patient.  No family at bedside. Disposition: Likely home once pain medication has been titrated by palliative care hopefully tomorrow.    Status is: Inpatient Remains inpatient appropriate because: Severity of illness   Consultants:  Interventional radiology: Dr. Serafina Royals 02/07/2023 Palliative care: Jobe Gibbon, NP 02/06/2023 Oncology: Dr. Burr Medico  02/06/2023   Procedures:  CT angiogram chest 02/06/2023 Chest x-ray 02/06/2023 Port-A-Cath placement 02/07/2023 per IR  Antimicrobials:  IV cefepime 02/06/2023>>>> 02/08/2023 IV Zosyn 215 2024 x 1 dose IV vancomycin 02/06/2023>>>> 02/07/2023 Transfused 2 units packed red blood cells 02/08/2023   Subjective: Sitting up in bed.  Denies any chest pain.  No significant shortness of breath.  No worsening abdominal pain.  Complaining that did not receive pain medication on time last night.  Just seen by palliative care.    Objective: Vitals:   02/09/23 1406 02/09/23 2110 02/10/23 0017 02/10/23 0656  BP: (!) 147/85 (!) 137/96 (!) 144/99 (!) 152/102  Pulse: 91 98 (!) 103 95  Resp:  16 16 16  $ Temp: 98.6 F (37 C) 98.8 F (37.1 C) 98.7 F (37.1 C) 98.2 F (36.8 C)  TempSrc: Oral Oral Oral Oral  SpO2: 92% 92% 94% 96%  Weight:      Height:        Intake/Output Summary (Last 24 hours) at 02/10/2023 1313 Last data filed at 02/10/2023 0900 Gross per 24 hour  Intake 760 ml  Output --  Net 760 ml    Filed Weights   02/06/23 0930  Weight: 85.5 kg    Examination:  General exam: NAD. Respiratory system: CTAB.  No wheezes, no crackles, no rhonchi.  Fair air movement.  Speaking in full sentences.  Cardiovascular system: Regular rate rhythm no murmurs rubs or gallops.  No JVD.  No lower extremity edema.  Gastrointestinal system: Abdomen is soft, nontender, nondistended, positive bowel sounds.  No rebound.  No guarding.  Central nervous system: Alert and oriented. No focal neurological deficits. Extremities: Symmetric 5 x 5 power. Skin: No rashes, lesions or ulcers Psychiatry: Judgement and insight appear normal. Mood & affect appropriate.     Data Reviewed: I have personally reviewed following labs and imaging studies  CBC: Recent Labs  Lab 02/04/23 0536 02/06/23 1011 02/07/23 0507 02/08/23 0435 02/09/23 0252 02/10/23 0349  WBC 9.0 12.7* 8.5 7.4 7.6 8.2  NEUTROABS 6.8 11.0*  --    --   --   --   HGB 8.7* 8.6* 7.2* 6.7* 9.6* 9.9*  HCT 28.0* 28.5* 24.3* 21.6* 30.3* 31.0*  MCV 89.7 91.6 92.7 91.1 88.9 89.3  PLT 201 253 193 173 169 191     Basic Metabolic Panel: Recent Labs  Lab 02/04/23 0536 02/06/23 1011 02/07/23 0507 02/08/23 0435 02/09/23 0252 02/10/23 0349  NA 134* 137 136 137 138 136  K 3.9 3.9 3.7 3.3* 3.5 4.0  CL 96* 97* 101 104 104 104  CO2 26 25 23 24 24 25  $ GLUCOSE 186* 235* 136* 124* 147* 130*  BUN 19 24* 25* 20 16 12  $ CREATININE 0.89 1.38* 1.24* 1.03* 0.77 0.82  CALCIUM 8.3* 8.4* 8.0* 7.7* 7.9* 7.9*  MG 2.0  --   --   --   --   --   PHOS 3.6  --   --   --   --   --      GFR: Estimated Creatinine Clearance: 63.7 mL/min (by C-G formula based on SCr of 0.82 mg/dL).  Liver Function Tests: Recent Labs  Lab 02/04/23 0536 02/06/23 1011  AST 85* 90*  ALT 65* 61*  ALKPHOS 255* 243*  BILITOT 1.1 1.3*  PROT 6.8 7.0  ALBUMIN 2.7* 2.6*     CBG: Recent Labs  Lab 02/09/23 1132 02/09/23 1641 02/09/23 2113 02/10/23 0725 02/10/23 1126  GLUCAP 158* 291* 161* 137* 212*      Recent Results (from the past 240 hour(s))  Blood culture (routine x 2)     Status: None (Preliminary result)   Collection Time: 02/06/23 10:12 AM   Specimen: BLOOD  Result Value Ref Range Status   Specimen Description   Final    BLOOD BLOOD LEFT ARM Performed at [facility], [place] [address] [place], [place] [address]    Special Requests   Final    BOTTLES DRAWN AEROBIC AND ANAEROBIC Blood Culture results may not be optimal due to an inadequate volume of blood received in culture bottles Performed at [place] [address] [place], [place] [address]    Culture   Final    NO GROWTH 4 DAYS Performed at [facility], [place] [address] [place], [place] [address]    Report Status PENDING  Incomplete  Blood culture (routine x 2)     Status: None (Preliminary result)   Collection Time: 02/06/23 10:12 AM    Specimen: BLOOD  Result Value Ref Range Status   Specimen Description   Final    BLOOD LEFT ANTECUBITAL Performed at [place] [address], [place], [place] [address]    Special Requests   Final    BOTTLES DRAWN AEROBIC AND ANAEROBIC Blood Culture results may not be optimal due to an inadequate volume of blood received in culture bottles Performed at [place] [address] [place], [place] [address]    Culture   Final    NO GROWTH 4 DAYS Performed at [facility]  [facility], [place] [address] [place], [place] [address]    Report Status PENDING  Incomplete  Surgical pcr screen     Status: None   Collection Time: 02/07/23  6:28 AM   Specimen: Nasal Mucosa; Nasal Swab  Result Value Ref Range Status   MRSA, PCR NEGATIVE NEGATIVE Final   Staphylococcus aureus NEGATIVE NEGATIVE Final    Comment: (NOTE) The Xpert SA Assay (FDA approved for NASAL specimens in patients 54 years of age and older), is one component of a comprehensive surveillance program. It is not intended to diagnose infection nor to guide or monitor treatment. Performed at [facility], [place] [address], [place], [place] [address]          Radiology Studies: No results found.      Scheduled Meds:  ALPRAZolam  0.5 mg Oral BID   apixaban  5 mg Oral BID   Chlorhexidine Gluconate Cloth  6 each Topical Daily   ferrous sulfate  325 mg Oral Daily   furosemide  20 mg Oral Daily   insulin aspart  0-15 Units Subcutaneous TID WC   insulin aspart  0-5 Units Subcutaneous QHS   loratadine  10 mg Oral Daily   [START ON 02/11/2023] morphine  15 mg Oral Daily   morphine  30 mg Oral QPM   pantoprazole  40 mg Oral QAC breakfast   polyethylene glycol  17 g Oral Daily   senna-docusate  1 tablet Oral QHS   Continuous Infusions:  sodium chloride       LOS: 4 days    Time spent: 35 minutes    Irine Seal, MD [facility]   To contact the  attending provider between 7A-7P or the covering provider during after hours 7P-7A, please log into the web site [email or web address] and access using universal [place] password for that web site. If you do not have the password, please call the hospital operator.  02/10/2023, 1:13 PM

## 2023-02-11 ENCOUNTER — Encounter (HOSPITAL_COMMUNITY): Payer: Self-pay | Admitting: Internal Medicine

## 2023-02-11 ENCOUNTER — Telehealth: Payer: Self-pay | Admitting: Hematology

## 2023-02-11 DIAGNOSIS — C799 Secondary malignant neoplasm of unspecified site: Secondary | ICD-10-CM | POA: Diagnosis not present

## 2023-02-11 DIAGNOSIS — J189 Pneumonia, unspecified organism: Secondary | ICD-10-CM | POA: Diagnosis not present

## 2023-02-11 DIAGNOSIS — Z79899 Other long term (current) drug therapy: Secondary | ICD-10-CM | POA: Diagnosis not present

## 2023-02-11 DIAGNOSIS — Z515 Encounter for palliative care: Secondary | ICD-10-CM | POA: Diagnosis not present

## 2023-02-11 DIAGNOSIS — C259 Malignant neoplasm of pancreas, unspecified: Secondary | ICD-10-CM | POA: Diagnosis not present

## 2023-02-11 LAB — CULTURE, BLOOD (ROUTINE X 2)
Culture: NO GROWTH
Culture: NO GROWTH

## 2023-02-11 LAB — BASIC METABOLIC PANEL
Anion gap: 9 (ref 5–15)
BUN: 13 mg/dL (ref 8–23)
CO2: 24 mmol/L (ref 22–32)
Calcium: 7.8 mg/dL — ABNORMAL LOW (ref 8.9–10.3)
Chloride: 103 mmol/L (ref 98–111)
Creatinine, Ser: 0.75 mg/dL (ref 0.44–1.00)
GFR, Estimated: >60 mL/min (ref >60)
Glucose, Bld: 157 mg/dL — ABNORMAL HIGH (ref 70–99)
Potassium: 4.1 mmol/L (ref 3.5–5.1)
Sodium: 136 mmol/L (ref 135–145)

## 2023-02-11 LAB — CBC
HCT: 30 % — ABNORMAL LOW (ref 36.0–46.0)
Hemoglobin: 9.5 g/dL — ABNORMAL LOW (ref 12.0–15.0)
MCH: 28.6 pg (ref 26.0–34.0)
MCHC: 31.7 g/dL (ref 30.0–36.0)
MCV: 90.4 fL (ref 80.0–100.0)
Platelets: 156 10*3/uL (ref 150–400)
RBC: 3.32 MIL/uL — ABNORMAL LOW (ref 3.87–5.11)
RDW: 17.6 % — ABNORMAL HIGH (ref 11.5–15.5)
WBC: 7.6 10*3/uL (ref 4.0–10.5)
nRBC: 0 % (ref 0.0–0.2)

## 2023-02-11 LAB — GLUCOSE, CAPILLARY
Glucose-Capillary: 149 mg/dL — ABNORMAL HIGH (ref 70–99)
Glucose-Capillary: 159 mg/dL — ABNORMAL HIGH (ref 70–99)
Glucose-Capillary: 150 mg/dL — ABNORMAL HIGH (ref 70–99)
Glucose-Capillary: 197 mg/dL — ABNORMAL HIGH (ref 70–99)

## 2023-02-11 NOTE — Progress Notes (Signed)
Occupational Therapy Treatment Patient Details Name: Victoria [name] MRN: [id number] DOB: [birth year] Today's Date: 02/11/2023   History of present illness Patient is a 82 year old female who presented on 02/06/23  shortness of breath. Patient was admitted with recurrent respiratory failure with hypoxia, and concerns for CAP. PMH: recent d/c on 2/14 home with family support. DM II, DVT/PE, hepatitis C liver cirrhosis, normocytic anemia, pancreatic cancer   OT comments  Patient declined to work on O2 cord management in room. Patient was educated on ECT transitioning home and importance of having increased supports at home to focus on needs v.s. wants in daily life. Patient verbalized understanding reporting family would "be around". Patient would benefit from 24/7 support during first few days home with noted decreased functional activity tolerance and safety awareness with ADLs. Patient's discharge plan remains appropriate at this time. OT will continue to follow acutely.     Recommendations for follow up therapy are one component of a multi-disciplinary discharge planning process, led by the attending physician.  Recommendations may be updated based on patient status, additional functional criteria and insurance authorization.    Follow Up Recommendations  Home health OT     Assistance Recommended at Discharge Frequent or constant Supervision/Assistance  Patient can return home with the following  A little help with bathing/dressing/bathroom;Direct supervision/assist for financial management;Help with stairs or ramp for entrance;Assist for transportation;Direct supervision/assist for medications management;Assistance with cooking/housework   Equipment Recommendations  None recommended by OT       Precautions / Restrictions Precautions Precautions: Fall Precaution Comments: monitor O2 Restrictions Weight Bearing Restrictions: No              ADL either performed or assessed with  clinical judgement   ADL Overall ADL's : Needs assistance/impaired             General ADL Comments: Patient declined to participate in time out of bed even with education on manuvering O2 line. patient reported that she did not need O2 that much. patient was educated on how O2 dropped with activity and we needed to maintain O2 above 90%. patient verbalized understanding. ECT was reviewed and patient was educated on importance of having family support in next level of care. patient verbalized understanding and reported that "family would be around" when she got home. unclear ammount of support available at this time. patient was provided with ECT handouts. patient verbalized understanding.      Cognition Arousal/Alertness: Awake/alert Behavior During Therapy: WFL for tasks assessed/performed Overall Cognitive Status: Within Functional Limits for tasks assessed           General Comments: patient was noted to have poor insight to deficits during session.                   Pertinent Vitals/ Pain       Pain Assessment Pain Assessment: Faces Faces Pain Scale: Hurts a little bit Pain Location: abdomen Pain Descriptors / Indicators: Discomfort Pain Intervention(s): Limited activity within patient's tolerance         Frequency  Min 2X/week        Progress Toward Goals  OT Goals(current goals can now be found in the care plan section)  Progress towards OT goals: OT to reassess next treatment     Plan Discharge plan remains appropriate       AM-PAC OT "6 Clicks" Daily Activity     Outcome Measure   Help from another person eating meals?: None Help from another  person taking care of personal grooming?: None Help from another person toileting, which includes using toliet, bedpan, or urinal?: A Little Help from another person bathing (including washing, rinsing, drying)?: A Little Help from another person to put on and taking off regular upper body clothing?: A  Little Help from another person to put on and taking off regular lower body clothing?: A Little 6 Click Score: 20    End of Session    OT Visit Diagnosis: Unsteadiness on feet (R26.81);Other abnormalities of gait and mobility (R26.89)   Activity Tolerance Patient tolerated treatment well   Patient Left in bed;with call bell/phone within reach   Nurse Communication Other (comment) (ok to participate in session)        Time: [id number] OT Time Calculation (min): 26 min  Charges: OT General Charges $OT Visit: 1 Visit OT Treatments $Self Care/Home Management : 23-37 mins  Rennie Plowman, MS Acute Rehabilitation Department Office# [phone]   Willa Rough 02/11/2023, 5:10 PM

## 2023-02-11 NOTE — Telephone Encounter (Signed)
Contacted patient to scheduled appointments. Patient is aware of appointments that are scheduled.   

## 2023-02-11 NOTE — Progress Notes (Signed)
Victoria [name]   DOB:07-07-1941   [id number]   [id number]  Med onc follow up   Subjective: Patient is overall feeling better, still has ups and downs, she has been using oxygen intermittently, pain is controlled, no other complaints.  She will likely be discharged today or tomorrow.   Objective:  Vitals:   02/11/23 0608 02/11/23 1154  BP: 128/75 121/76  Pulse: 91 93  Resp: 18 17  Temp: 98.9 F (37.2 C) 98 F (36.7 C)  SpO2: 96% 91%    Body mass index is 28.66 kg/m.  Intake/Output Summary (Last 24 hours) at 02/11/2023 1332 Last data filed at 02/11/2023 1155 Gross per 24 hour  Intake 1235 ml  Output 250 ml  Net 985 ml     Sclerae unicteric  Oropharynx clear  No peripheral adenopathy  Lungs bilateral scattered rhonchi  Heart regular rate and rhythm  Abdomen benign  MSK no focal spinal tenderness, no peripheral edema  Neuro nonfocal   CBG (last 3)  Recent Labs    02/10/23 1726 02/10/23 2232 02/11/23 0737  GLUCAP 149* 159* 150*     Labs:  Urine Studies No results for input(s): "UHGB", "CRYS" in the last 72 hours.  Invalid input(s): "UACOL", "UAPR", "USPG", "UPH", "UTP", "UGL", "UKET", "UBIL", "UNIT", "UROB", "ULEU", "UEPI", "UWBC", "URBC", "UBAC", "CAST", "UCOM", "BILUA"  Basic Metabolic Panel: Recent Labs  Lab 02/07/23 0507 02/08/23 0435 02/09/23 0252 02/10/23 0349 02/11/23 0407  NA 136 137 138 136 136  K 3.7 3.3* 3.5 4.0 4.1  CL 101 104 104 104 103  CO2 23 24 24 25 24  $ GLUCOSE 136* 124* 147* 130* 157*  BUN 25* 20 16 12 13  $ CREATININE 1.24* 1.03* 0.77 0.82 0.75  CALCIUM 8.0* 7.7* 7.9* 7.9* 7.8*   GFR Estimated Creatinine Clearance: 65.3 mL/min (by C-G formula based on SCr of 0.75 mg/dL). Liver Function Tests: Recent Labs  Lab 02/06/23 1011  AST 90*  ALT 61*  ALKPHOS 243*  BILITOT 1.3*  PROT 7.0  ALBUMIN 2.6*   No results for input(s): "LIPASE", "AMYLASE" in the last 168 hours. No results for input(s): "AMMONIA" in the last 168  hours. Coagulation profile Recent Labs  Lab 02/06/23 1011  INR 2.4*    CBC: Recent Labs  Lab 02/06/23 1011 02/07/23 0507 02/08/23 0435 02/09/23 0252 02/10/23 0349 02/11/23 0407  WBC 12.7* 8.5 7.4 7.6 8.2 7.6  NEUTROABS 11.0*  --   --   --   --   --   HGB 8.6* 7.2* 6.7* 9.6* 9.9* 9.5*  HCT 28.5* 24.3* 21.6* 30.3* 31.0* 30.0*  MCV 91.6 92.7 91.1 88.9 89.3 90.4  PLT 253 193 173 169 191 156   Cardiac Enzymes: No results for input(s): "CKTOTAL", "CKMB", "CKMBINDEX", "TROPONINI" in the last 168 hours. BNP: Invalid input(s): "POCBNP" CBG: Recent Labs  Lab 02/10/23 0725 02/10/23 1126 02/10/23 1726 02/10/23 2232 02/11/23 0737  GLUCAP 137* 212* 149* 159* 150*   D-Dimer No results for input(s): "DDIMER" in the last 72 hours. Hgb A1c No results for input(s): "HGBA1C" in the last 72 hours. Lipid Profile No results for input(s): "CHOL", "HDL", "LDLCALC", "TRIG", "CHOLHDL", "LDLDIRECT" in the last 72 hours. Thyroid function studies No results for input(s): "TSH", "T4TOTAL", "T3FREE", "THYROIDAB" in the last 72 hours.  Invalid input(s): "FREET3" Anemia work up No results for input(s): "VITAMINB12", "FOLATE", "FERRITIN", "TIBC", "IRON", "RETICCTPCT" in the last 72 hours. Microbiology Recent Results (from the past 240 hour(s))  Blood culture (routine x 2)  Status: None   Collection Time: 02/06/23 10:12 AM   Specimen: BLOOD  Result Value Ref Range Status   Specimen Description   Final    BLOOD BLOOD LEFT ARM Performed at [facility] [address] [place], [place] [address]    Special Requests   Final    BOTTLES DRAWN AEROBIC AND ANAEROBIC Blood Culture results may not be optimal due to an inadequate volume of blood received in culture bottles Performed at [facility] [address] [place], [place] [address]    Culture   Final    NO GROWTH 5 DAYS Performed at [facility], [place] [address] [place], [place] [address]     Report Status 02/11/2023 FINAL  Final  Blood culture (routine x 2)     Status: None   Collection Time: 02/06/23 10:12 AM   Specimen: BLOOD  Result Value Ref Range Status   Specimen Description   Final    BLOOD LEFT ANTECUBITAL Performed at [facility] [address], [place], [place] [address]    Special Requests   Final    BOTTLES DRAWN AEROBIC AND ANAEROBIC Blood Culture results may not be optimal due to an inadequate volume of blood received in culture bottles Performed at [facility] [address], [place], [place] [address]    Culture   Final    NO GROWTH 5 DAYS Performed at [facility], [place] [address], [place], [place] [address]    Report Status 02/11/2023 FINAL  Final  Surgical pcr screen     Status: None   Collection Time: 02/07/23  6:28 AM   Specimen: Nasal Mucosa; Nasal Swab  Result Value Ref Range Status   MRSA, PCR NEGATIVE NEGATIVE Final   Staphylococcus aureus NEGATIVE NEGATIVE Final    Comment: (NOTE) The Xpert SA Assay (FDA approved for NASAL specimens in patients 77 years of age and older), is one component of a comprehensive surveillance program. It is not intended to diagnose infection nor to guide or monitor treatment. Performed at [facility], [place] [address], [place],  [address]       Studies:  No results found.  Assessment: 82 y.o. female   HCAP with hypoxia, improved  Newly diagnosed pancreatic cancer, metastatic History of PE/DVT Hypertension Diabetes    Plan:  -She is overall clinically stable, slightly improved, has been off antibiotics for 2 days.  I agree that her hypoxia is probably related to her pulmonary metastasis. -She has had a port placed, plan to start chemotherapy with single agent gemcitabine next week.  Will arrange chemo class and her chemo.  Patient lives alone, she needs transportation assistance, I will inform our team. -Continue other  supportive care, I reviewed with Dr. Grandville Silos.   Truitt Merle  02/11/2023    Truitt Merle, MD 02/11/2023  1:32 PM

## 2023-02-11 NOTE — Progress Notes (Signed)
Daily Progress Note   Patient Name: Victoria [name]       Date: 02/11/2023 DOB: May 26, 1941  Age: 82 y.o. MRN#: [id number] Attending Physician: Eugenie Filler, MD Primary Care Physician: Elby Showers, MD Admit Date: 02/06/2023 Length of Stay: 5 days  Reason for Consultation/Follow-up: Establishing goals of care and Symptom Management  Subjective:   CC:    Following up regarding symptom management.  Subjective:  Medication history reviewed, patient awake alert resting in bed, denies complaints Is on 3 L supplemental oxygen via nasal cannula Medication history usage of opioids discussed with patient.  She is currently on MS Contin 15 mg in the morning and 30 mg in the evening.  She is also on scheduled Xanax.  She has required 35 mg of oral oxycodone in the last 24 hours.  Overall, she states that that her symptoms are reasonably well-controlled and she is anticipating discharge possibly on 02-12-2023   All questions answered at that time.  Thanked patient for allowing me to visit with her today  Discussed care with hospitalist before and after visit.   Review of Systems Feels pain is reasonably well controlled today.   Objective:   Vital Signs:  BP 121/76 (BP Location: Left Arm)   Pulse 93   Temp 98 F (36.7 C) (Oral)   Resp 17   Ht 5' 8"$  (1.727 m)   Wt 85.5 kg   SpO2 91%   BMI 28.66 kg/m   Physical Exam: General: NAD, alert, laying in bed Eyes: no drainage noted HENT: moist mucous membranes Cardiovascular: RRR Respiratory: no increased work of breathing noted, not in respiratory distress Abdomen: not distended Extremities: moving Ues appropriately  Skin: no rashes or lesions on visible skin Neuro: A&Ox4, following commands easily Psych: anxious, frustrated  Imaging:  I personally reviewed recent imaging.   Assessment & Plan:   Assessment: Palliative Care consult requested for goals of care discussion in this 82 y.o. female  with past medical history of  hepatitis C, right breast cancer s/p lumpectomy, type 2 diabetes, hypertension, thrombocytopenia, PE/DVT, and recent diagnosis of pancreatic cancer (12/2022) followed by Dr. Burr Medico with plans to initiate single agent treatment. Patient was seen during previous admission, discharged on 02/04/2023, now patient is currently admitted on 02/06/2023 with new hypoxia.   Recommendations/Plan: # Complex medical decision making/goals of care:  - Patient's goals for medical care have remained focused on full code/full scope of therapy.  -Continue with current plan of care per medical team. Patient has clearly expressed wishes to treat the treatable allowing her an opportunity to live. Is hopeful she will be able to pursue oncological treatment. Realistic in her understanding however also conflicted in son's acceptance. Encouraged ongoing discussions. Pmt has offered to facilitate additional discussions. Patient would like to continue to speak with him privately for now but would like to have family discussions in the future .  -  Code Status: Full Code  # Symptom management:  -Pain, acute in the setting of pancreatic cancer      MS Contin 5m at 10 AM daily     MS Contin to 349mat 10PM daily to help better control worsening nighttime pain   -Continue oxycodone 5-1011m4hrs prn for breakthrough pain   -Constipation   -Receiving Miralax 17gm and senna 1 tab daily   -Anxiety        Xanax 0.5mg47m 9AM and 9PM daily.   # Discharge Planning: Home.    -Follow up with PMT  at [facility] once discharged.   Discussed with: hospitalist, patient, outpatient PMT provider  Thank you for allowing the palliative care team to participate in the care Janann August.  Mod MDM Loistine Chance MD Palliative Care Provider PMT # [phone]  If patient remains symptomatic despite maximum doses, please call PMT at [phone] between 0700 and 1900. Outside of these hours, please call attending, as PMT does not have night  coverage.

## 2023-02-11 NOTE — Progress Notes (Signed)
PROGRESS NOTE    Victoria [name]  [id number] DOB: 20-Jul-1941 DOA: 02/06/2023 PCP: Elby Showers, MD    Chief Complaint  Patient presents with   Weakness    Brief Narrative:  HPI per Dr. Lonni Fix is a 82 y.o. female with medical history significant for tree of DVT and PE on Eliquis, insulin-dependent type 2 diabetes, pancreatic cancer and recent hospital stay for multifocal community-acquired pneumonia who is being admitted to the hospital with recurrent hypoxic respiratory failure and concern for healthcare acquired pneumonia.  Patient was discharged from this hospital on 2/13 after a stay during which she was treated with empiric IV azithromycin and Rocephin for 5 days.  Patient improved, was stable on room air and discharged home in stable condition yesterday.  Of note, the day prior to discharge from the hospital patient had some hypoxic respiratory failure was seen by pulmonary who did not recommend any changes in management.  She has actually been feeling pretty well, denies any chest pain, fevers, nausea, vomiting.  She came to [facility] today for palliative care outpatient consultation, and chest port placement with radiology.  However she was noted to be saturating in the 80s on room air, and sent to the emergency department for evaluation.   ED Course: Patient was evaluated in the emergency department, he had reportedly been saturating in the 80s on room air in the outpatient clinic.  Was started on 2 L nasal cannula oxygen now saturating 99%.  Lab work reveals new leukocytosis of 13,000, hemoglobin stable at 8.6 platelets 253.  Creatinine up from normal baseline to 1.38.  Patient was given empiric IV Zosyn for suspected healthcare acquired pneumonia, and hospitalist contacted for admission.  Assessment & Plan:   Principal Problem:   HCAP (healthcare-associated pneumonia) Active Problems:   DM2 (diabetes mellitus, type 2) ([place])   Hypertension    Hepatitis C, chronic (HCC)   Pancreatic cancer ([place])   History of DVT (deep vein thrombosis)   Palliative care patient   AKI (acute kidney injury) ([place])   Metastatic malignant neoplasm ([place])   High risk medication use   Medication management   Counseling and coordination of care   Cancer associated pain   Palliative care encounter  #1 hypoxia/HCAP -Patient admitted with hypoxia noted to have sats of 80% on room air in the outpatient clinic setting and started on 2 L nasal cannula. -CBC done on admission with a leukocytosis white count of 13,000, hemoglobin noted to be stable at 8.6, chest x-ray done with diffuse bilateral interstitial pulmonary opacity, consistent with edema or atypical/viral infection.  No new or focal airspace opacity. -CT angiogram chest no PE, aneurysm or dissection.  Extensive nodular interstitial process throughout the lungs consistent with atypical infection versus metastatic disease with an definite interval change.  Nonspecific mildly prominent mediastinal nodes.  Pancreatic head mass that was not fully imaged on this study. -Patient's hypoxia likely secondary to metastatic disease from pancreatic cancer. -Patient recently hospitalized and treated empirically with antibiotics for pneumonia on 2 recent separate occasions.  Patient also treated for volume overload. -Patient afebrile.  Denies any significant productive cough. -Urine strep pneumococcus antigen negative. -Urine Legionella antigen negative. -MRSA PCR negative. -IV vancomycin, IV cefepime discontinued and monitoring patient off antibiotics.   -Patient likely needs home O2 on discharge. -Home O2 ordered. -Will need outpatient follow-up with PCP and hematology/oncology.  2.  Anemia of chronic disease -Patient with no overt bleeding, hemoglobin currently at 9.5 from  9.9 from 9.6 from 6.7  from 7.2 from 8.6 on admission. -Status post transfusion 2 units packed red blood cells 02/08/2023. -Anemia panel done  01/24/2023 with iron level of 87, TIBC of 395. -Follow H&H. -Transfusion threshold hemoglobin < 7.  3.  Newly diagnosed metastatic pancreatic cancer -Patient being followed by oncology, oncology assessed patient and reviewed poor prognosis from oncology standpoint with patient and recommending chemo option versus palliative care alone. -Patient noted to strongly want to try chemotherapy and not ready for hospice at this time, patient full code. -Patient seen by palliative care and pain regiment adjusted, and patient at this time wanting full scope of care.. -Continue MS Contin twice daily, oxycodone as needed for breakthrough pain, IV Dilaudid every 6 hours as needed as well as Xanax 0.5 mg twice daily. -Patient status post Port-A-Cath placement 02/07/2023.  -Per oncology. -Will need outpatient follow-up with palliative care and oncology. -Palliative care following and adjusting pain regiment for better pain control.  4.  History of DVT -Continue Eliquis.    5.  Hypertension -BP noted to be soft/borderline early on on presentation.. -Blood pressure improved.   -Lasix resumed.   -Continue to hold other antihypertensive medications.     6.  Diabetes mellitus type 2 -Hemoglobin A1c 6.7  (12/06/2022) -CBG 150 this morning.  -SSI.  7.  History of chronic hep C -Patient noted to have been treated. -Outpatient follow-up.  8.  Anxiety -Xanax changed to scheduled twice daily per palliative care.   -Follow.  9.  AKI -Improved with hydration.   -IV fluids have been discontinued.    DVT prophylaxis: Eliquis Code Status: Full Family Communication: Updated patient.  No family at bedside. Disposition: Likely home tomorrow if pain continued to be controlled.  Status is: Inpatient Remains inpatient appropriate because: Severity of illness   Consultants:  Interventional radiology: Dr. Serafina Royals 02/07/2023 Palliative care: Jobe Gibbon, NP 02/06/2023 Oncology: Dr. Burr Medico  02/06/2023   Procedures:  CT angiogram chest 02/06/2023 Chest x-ray 02/06/2023 Port-A-Cath placement 02/07/2023 per IR  Antimicrobials:  IV cefepime 02/06/2023>>>> 02/08/2023 IV Zosyn 215 2024 x 1 dose IV vancomycin 02/06/2023>>>> 02/07/2023 Transfused 2 units packed red blood cells 02/08/2023   Subjective: Laying in bed.  Just seen by oncology.  Patient denies any chest pain.  No significant shortness of breath.  No worsening abdominal pain.  Stated adjustment in nighttime pain medication per palliative care really helped her rest.  States she is making arrangements for family to pick her up tomorrow to go home as she lives alone and feels unsafe going home by herself.    Objective: Vitals:   02/10/23 1425 02/10/23 1941 02/11/23 0608 02/11/23 1154  BP: 137/86 (!) 138/93 128/75 121/76  Pulse: (!) 110 (!) 104 91 93  Resp: 16 17 18 17  $ Temp: 98.7 F (37.1 C) 98.8 F (37.1 C) 98.9 F (37.2 C) 98 F (36.7 C)  TempSrc: Oral Oral Oral Oral  SpO2: 94% 100% 96% 91%  Weight:      Height:        Intake/Output Summary (Last 24 hours) at 02/11/2023 1216 Last data filed at 02/11/2023 1155 Gross per 24 hour  Intake 1235 ml  Output 250 ml  Net 985 ml    Filed Weights   02/06/23 0930  Weight: 85.5 kg    Examination:  General exam: NAD. Respiratory system: Lungs are auscultation bilaterally.  No wheezes, no crackles, no rhonchi.  Fair air movement.  Speaking in full sentences.   Cardiovascular  system: RRR no murmurs rubs or gallops.  No JVD.  No lower extremity edema.  Gastrointestinal system: Abdomen soft, nontender, nondistended, positive bowel sounds.  No rebound.  No guarding.  Central nervous system: Alert and oriented. No focal neurological deficits. Extremities: Symmetric 5 x 5 power. Skin: No rashes, lesions or ulcers Psychiatry: Judgement and insight appear normal. Mood & affect appropriate.     Data Reviewed: I have personally reviewed following labs and imaging  studies  CBC: Recent Labs  Lab 02/06/23 1011 02/07/23 0507 02/08/23 0435 02/09/23 0252 02/10/23 0349 02/11/23 0407  WBC 12.7* 8.5 7.4 7.6 8.2 7.6  NEUTROABS 11.0*  --   --   --   --   --   HGB 8.6* 7.2* 6.7* 9.6* 9.9* 9.5*  HCT 28.5* 24.3* 21.6* 30.3* 31.0* 30.0*  MCV 91.6 92.7 91.1 88.9 89.3 90.4  PLT 253 193 173 169 191 156     Basic Metabolic Panel: Recent Labs  Lab 02/07/23 0507 02/08/23 0435 02/09/23 0252 02/10/23 0349 02/11/23 0407  NA 136 137 138 136 136  K 3.7 3.3* 3.5 4.0 4.1  CL 101 104 104 104 103  CO2 23 24 24 25 24  $ GLUCOSE 136* 124* 147* 130* 157*  BUN 25* 20 16 12 13  $ CREATININE 1.24* 1.03* 0.77 0.82 0.75  CALCIUM 8.0* 7.7* 7.9* 7.9* 7.8*     GFR: Estimated Creatinine Clearance: 65.3 mL/min (by C-G formula based on SCr of 0.75 mg/dL).  Liver Function Tests: Recent Labs  Lab 02/06/23 1011  AST 90*  ALT 61*  ALKPHOS 243*  BILITOT 1.3*  PROT 7.0  ALBUMIN 2.6*     CBG: Recent Labs  Lab 02/10/23 0725 02/10/23 1126 02/10/23 1726 02/10/23 2232 02/11/23 0737  GLUCAP 137* 212* 149* 159* 150*      Recent Results (from the past 240 hour(s))  Blood culture (routine x 2)     Status: None   Collection Time: 02/06/23 10:12 AM   Specimen: BLOOD  Result Value Ref Range Status   Specimen Description   Final    BLOOD BLOOD LEFT ARM Performed at [facility] [address] [place], [place] [address]    Special Requests   Final    BOTTLES DRAWN AEROBIC AND ANAEROBIC Blood Culture results may not be optimal due to an inadequate volume of blood received in culture bottles Performed at [facility] [address] [place], [place] [address]    Culture   Final    NO GROWTH 5 DAYS Performed at [facility], [place] [address] [place], [place] [address]    Report Status 02/11/2023 FINAL  Final  Blood culture (routine x 2)     Status: None   Collection Time: 02/06/23 10:12 AM   Specimen: BLOOD   Result Value Ref Range Status   Specimen Description   Final    BLOOD LEFT ANTECUBITAL Performed at [facility] [address], [place], [place] [address]    Special Requests   Final    BOTTLES DRAWN AEROBIC AND ANAEROBIC Blood Culture results may not be optimal due to an inadequate volume of blood received in culture bottles Performed at [facility] [address] [place],  [address]    Culture   Final    NO GROWTH 5 DAYS Performed at [facility], [place] [address], [place],  [address]    Report Status 02/11/2023 FINAL  Final  Surgical pcr screen  Status: None   Collection Time: 02/07/23  6:28 AM   Specimen: Nasal Mucosa; Nasal Swab  Result Value Ref Range Status   MRSA, PCR NEGATIVE NEGATIVE Final   Staphylococcus aureus NEGATIVE NEGATIVE Final    Comment: (NOTE) The Xpert SA Assay (FDA approved for NASAL specimens in patients 84 years of age and older), is one component of a comprehensive surveillance program. It is not intended to diagnose infection nor to guide or monitor treatment. Performed at [facility], [place] [address], [place], [place] [address]          Radiology Studies: No results found.      Scheduled Meds:  ALPRAZolam  0.5 mg Oral BID   apixaban  5 mg Oral BID   Chlorhexidine Gluconate Cloth  6 each Topical Daily   ferrous sulfate  325 mg Oral Daily   furosemide  20 mg Oral Daily   insulin aspart  0-15 Units Subcutaneous TID WC   insulin aspart  0-5 Units Subcutaneous QHS   loratadine  10 mg Oral Daily   morphine  15 mg Oral Daily   morphine  30 mg Oral QPM   pantoprazole  40 mg Oral QAC breakfast   polyethylene glycol  17 g Oral Daily   senna-docusate  1 tablet Oral QHS   Continuous Infusions:  sodium chloride 20 mL/hr at 02/10/23 1615     LOS: 5 days    Time spent: 35 minutes    Irine Seal, MD [facility]   To contact the attending  provider between 7A-7P or the covering provider during after hours 7P-7A, please log into the web site [email or web address] and access using universal [place] password for that web site. If you do not have the password, please call the hospital operator.  02/11/2023, 12:16 PM

## 2023-02-12 ENCOUNTER — Other Ambulatory Visit: Payer: Self-pay

## 2023-02-12 DIAGNOSIS — J189 Pneumonia, unspecified organism: Secondary | ICD-10-CM | POA: Diagnosis not present

## 2023-02-12 LAB — GLUCOSE, CAPILLARY
Glucose-Capillary: 125 mg/dL — ABNORMAL HIGH (ref 70–99)
Glucose-Capillary: 171 mg/dL — ABNORMAL HIGH (ref 70–99)
Glucose-Capillary: 140 mg/dL — ABNORMAL HIGH (ref 70–99)
Glucose-Capillary: 180 mg/dL — ABNORMAL HIGH (ref 70–99)

## 2023-02-12 MED ORDER — HEPARIN SOD (PORK) LOCK FLUSH 100 UNIT/ML IV SOLN
500.0000 [IU] | INTRAVENOUS | Status: AC | PRN
  Administered 2023-02-12: 500 [IU]
  Filled 2023-02-12: qty 5

## 2023-02-12 MED ORDER — ONDANSETRON HCL 4 MG PO TABS: 4.0000 mg | ORAL_TABLET | Freq: Four times a day (QID) | ORAL | 0 refills | Status: DC | PRN

## 2023-02-12 NOTE — Progress Notes (Signed)
Daily Progress Note   Patient Name: Victoria [name]       Date: 02/12/2023 DOB: 1941-05-13  Age: 82 y.o. MRN#: [id number] Attending Physician: Georgette Shell, MD Primary Care Physician: Elby Showers, MD Admit Date: 02/06/2023 Length of Stay: 6 days  Reason for Consultation/Follow-up: Establishing goals of care and Symptom Management  Subjective:   CC:    Following up regarding symptom management.  Subjective:  Medication history reviewed, patient awake alert resting in bed, denies complaints Is now on room air.  Medication history usage of opioids discussed with patient.  She is currently on MS Contin 15 mg in the morning and 30 mg in the evening.  She is also on scheduled Xanax.  She has required 20 mg of oral oxycodone in the last 24 hours.  Overall, she states that that her symptoms are reasonably well-controlled and she is anticipating discharge today on 02-12-2023.    All questions answered at that time.  Thanked patient for allowing me to visit with her today  Review of Systems Feels pain is reasonably well controlled today.   Objective:   Vital Signs:  BP (!) 151/95 (BP Location: Right Arm)   Pulse (!) 101   Temp 98.2 F (36.8 C) (Oral)   Resp 17   Ht 5' 8"$  (1.727 m)   Wt 85.5 kg   SpO2 90%   BMI 28.66 kg/m   Physical Exam: General: NAD, alert, laying in bed Eyes: no drainage noted HENT: moist mucous membranes Cardiovascular: RRR Respiratory: no increased work of breathing noted, not in respiratory distress Abdomen: not distended Extremities: moving Ues appropriately  Skin: no rashes or lesions on visible skin Neuro: A&Ox4, following commands easily Psych: mood and affect within normal limits today.   Imaging:  I personally reviewed recent imaging.   Assessment & Plan:   Assessment: Palliative Care consult requested for goals of care discussion in this 82 y.o. female  with past medical history of hepatitis C, right breast cancer s/p lumpectomy,  type 2 diabetes, hypertension, thrombocytopenia, PE/DVT, and recent diagnosis of pancreatic cancer (12/2022) followed by Dr. Burr Medico with plans to initiate single agent treatment. Patient was seen during previous admission, discharged on 02/04/2023, now patient is currently admitted on 02/06/2023 with new hypoxia.   Recommendations/Plan: # Complex medical decision making/goals of care:  - Patient's goals for medical care have remained focused on full code/full scope of therapy.  -Continue with current plan of care per medical team. Patient has clearly expressed wishes to treat the treatable allowing her an opportunity to live. Is hopeful she will be able to pursue oncological treatment. Realistic in her understanding however also conflicted in son's acceptance. Encouraged ongoing discussions. Pmt has offered to facilitate additional discussions. Patient would like to continue to speak with him privately for now but would like to have family discussions in the future .  -  Code Status: Full Code  # Symptom management:  -Pain, acute in the setting of pancreatic cancer      MS Contin 37m at 10 AM daily     MS Contin to 357mat 10PM daily to help better control worsening nighttime pain   -Continue oxycodone 5-1040m4hrs prn for breakthrough pain   -Constipation   -Receiving Miralax 17gm and senna 1 tab daily   -Anxiety        Xanax 0.5mg47m 9AM and 9PM daily.   # Discharge Planning: Home.    -[facility].  Discussed with: hospitalist, patient, outpatient PMT provider  Thank you for allowing the palliative care team to participate in the care Janann August.  low MDM Loistine Chance MD Palliative Care Provider PMT # [phone]  If patient remains symptomatic despite maximum doses, please call PMT at [phone] between 0700 and 1900. Outside of these hours, please call attending, as PMT does not have night coverage.

## 2023-02-12 NOTE — Progress Notes (Signed)
SATURATION QUALIFICATIONS: (This note is used to comply with regulatory documentation for home oxygen)  Patient Saturations on Room Air at Rest = 93%  Patient Saturations on Room Air while Ambulating = 84%  Patient Saturations on 2 Liters of oxygen while Ambulating = 90%  Please briefly explain why patient needs home oxygen: pt has recurrent hypoxic respiratory failure. Metastatic lesions to lungs.  Antonieta Pert, Student RN

## 2023-02-12 NOTE — Progress Notes (Signed)
Patient was given discharge instructions along will O2 for home use, and all questions were answered. Patient was stable for discharge and was taken to the main exit by wheelchair.

## 2023-02-12 NOTE — Discharge Summary (Signed)
Physician Discharge Summary  KESSIE SMITHER [id number] DOB: 08-Aug-1941 DOA: 02/06/2023  PCP: Elby Showers, MD  Admit date: 02/06/2023 Discharge date: 02/12/2023  Admitted From: Home Disposition: Home  recommendations for Outpatient Follow-up:  Follow up with PCP in 1-2 weeks Please obtain BMP/CBC in one week Please follow up with Dr. Burr Medico  Home Health: yes  equipment/Devices: Oxygen Discharge Condition: Stable CODE STATUS: Full code Diet recommendation: Cardiac Brief/Interim Summary: 82 y.o. female with medical history significant for tree of DVT and PE on Eliquis, insulin-dependent type 2 diabetes, pancreatic cancer and recent hospital stay for multifocal community-acquired pneumonia who is being admitted to the hospital with recurrent hypoxic respiratory failure and concern for healthcare acquired pneumonia.  Patient was discharged from this hospital on 2/13 after a stay during which she was treated with empiric IV azithromycin and Rocephin for 5 days.  Patient improved, was stable on room air and discharged home in stable condition yesterday.  Of note, the day prior to discharge from the hospital patient had some hypoxic respiratory failure was seen by pulmonary who did not recommend any changes in management.  She has actually been feeling pretty well, denies any chest pain, fevers, nausea, vomiting.  She came to [facility] today for palliative care outpatient consultation, and chest port placement with radiology.  However she was noted to be saturating in the 80s on room air, and sent to the emergency department for evaluation.   ED Course: Patient was evaluated in the emergency department, he had reportedly been saturating in the 80s on room air in the outpatient clinic.  Was started on 2 L nasal cannula oxygen now saturating 99%.  Lab work reveals new leukocytosis of 13,000, hemoglobin stable at 8.6 platelets 253.  Creatinine up from normal baseline to 1.38.  Patient was given  empiric IV Zosyn for suspected healthcare acquired pneumonia, and hospitalist contacted for admission.      Discharge Diagnoses:  Principal Problem:   HCAP (healthcare-associated pneumonia) Active Problems:   DM2 (diabetes mellitus, type 2) ([place])   Hypertension   Hepatitis C, chronic ([place])   Pancreatic cancer ([place])   History of DVT (deep vein thrombosis)   Palliative care patient   AKI (acute kidney injury) ([place])   Metastatic malignant neoplasm ([place])   High risk medication use   Medication management   Counseling and coordination of care   Cancer associated pain   Palliative care encounter  #1 hypoxia/HCAP -Patient admitted with hypoxia noted to have sats of 80% on room air in the outpatient clinic setting and started on 2 L nasal cannula., chest x-ray done with diffuse bilateral interstitial pulmonary opacity, consistent with edema or atypical/viral infection.  No new or focal airspace opacity.  She had leukocytosis on admission. -CT angiogram chest no PE, aneurysm or dissection.  Extensive nodular interstitial process throughout the lungs consistent with atypical infection versus metastatic disease with an definite interval change.  Nonspecific mildly prominent mediastinal nodes.  Pancreatic head mass that was not fully imaged on this study. -Patient's hypoxia was thought to be likely secondary to metastatic disease from pancreatic cancer. -Patient was recently hospitalized and treated empirically with antibiotics for pneumonia on 2 recent separate occasions.  Patient also treated for volume overload. -Urine strep pneumococcus antigen negative. -Urine Legionella antigen negative. -MRSA PCR negative. She received vancomycin and cefepime initially but then was discontinued and monitored patient off antibiotics. Patient was discharged home on oxygen.  2.  Anemia of chronic disease-her hemoglobin dropped to 6.7  during the hospital stay and received 2 units of packed RBC.  It remained  stable since then. -Anemia panel done  with iron level of 87, TIBC of 395.  3.  Newly diagnosed metastatic pancreatic cancer-patient was seen by oncology during this hospital stay.  She will follow-up with them for chemotherapy.  She was also seen by palliative care.  She had Port-A-Cath placed on 02/07/2023.   4.  History of DVT -Continue Eliquis.     5.  Hypertension -Continue home med  on discharge.     6.  Diabetes mellitus type 2- Continue home meds on discharge   7.  History of chronic hep C -Patient noted to have been treated. -Outpatient follow-up.   8.  Anxiety-continue Xanax   9.  AKI-resolved with hydration.    Estimated body mass index is 28.66 kg/m as calculated from the following:   Height as of this encounter: 5' 8"$  (1.727 m).   Weight as of this encounter: 85.5 kg.  Discharge Instructions  Discharge Instructions     Diet - low sodium heart healthy   Complete by: As directed    Increase activity slowly   Complete by: As directed    No wound care   Complete by: As directed    Clinic Appointment Request   Complete by: Feb 18, 2023    Contact your oncology clinic or infusion center to schedule this appointment.   Infusion Appointment Request   Complete by: Feb 18, 2023    Contact your oncology clinic or infusion center to schedule this appointment.   Lab Appointment Request   Complete by: Feb 18, 2023    Contact your oncology clinic or infusion center to schedule this appointment.   Clinic Appointment Request   Complete by: Feb 25, 2023    Contact your oncology clinic or infusion center to schedule this appointment.   Infusion Appointment Request   Complete by: Feb 25, 2023    Contact your oncology clinic or infusion center to schedule this appointment.   Lab Appointment Request   Complete by: Feb 25, 2023    Contact your oncology clinic or infusion center to schedule this appointment.   Clinic Appointment Request   Complete by: Mar 04, 2023     Contact your oncology clinic or infusion center to schedule this appointment.   Infusion Appointment Request   Complete by: Mar 04, 2023    Contact your oncology clinic or infusion center to schedule this appointment.   Lab Appointment Request   Complete by: Mar 04, 2023    Contact your oncology clinic or infusion center to schedule this appointment.      Allergies as of 02/12/2023       Reactions   Lisinopril Cough        Medication List     STOP taking these medications    albuterol 108 (90 Base) MCG/ACT inhaler Commonly known as: VENTOLIN HFA   B-complex with vitamin C tablet   cetirizine 10 MG tablet Commonly known as: ZYRTEC   chlorpheniramine-HYDROcodone 10-8 MG/5ML Commonly known as: TUSSIONEX   guaiFENesin 600 MG 12 hr tablet Commonly known as: Mucinex   loperamide 2 MG capsule Commonly known as: IMODIUM   losartan 100 MG tablet Commonly known as: COZAAR   oxymetazoline 0.05 % nasal spray Commonly known as: AFRIN       TAKE these medications    ALPRAZolam 0.5 MG tablet Commonly known as: XANAX TAKE 1 TABLET(0.5 MG) BY MOUTH TWICE  DAILY AS NEEDED FOR ANXIETY What changed: See the new instructions.   apixaban 5 MG Tabs tablet Commonly known as: ELIQUIS Take 1 tablet (5 mg total) by mouth 2 (two) times daily.   benzonatate 100 MG capsule Commonly known as: Tessalon Perles Take 1 capsule (100 mg total) by mouth 3 (three) times daily as needed for cough. What changed: when to take this   cholecalciferol 25 MCG (1000 UNIT) tablet Commonly known as: VITAMIN D3 Take 1,000 Units by mouth in the morning. When able to remember   ferrous sulfate 325 (65 FE) MG tablet Take 1 tablet (325 mg total) by mouth daily. What changed: additional instructions   freestyle lancets CHECK GLUCOSE TWICE A DAY   furosemide 20 MG tablet Commonly known as: Lasix Take 1 tablet (20 mg total) by mouth daily.   HYDROcodone-acetaminophen 5-325 MG tablet Commonly  known as: NORCO/VICODIN Take 1 tablet by mouth every 6 (six) hours as needed. What changed:  when to take this reasons to take this   metFORMIN 500 MG tablet Commonly known as: GLUCOPHAGE Take 500 mg by mouth 2 (two) times daily with a meal.   morphine 15 MG 12 hr tablet Commonly known as: MS CONTIN Take 1 tablet (15 mg total) by mouth every 12 (twelve) hours.   multivitamin with minerals Tabs tablet Take 1 tablet by mouth in the morning and at bedtime. When able to remember   ondansetron 4 MG tablet Commonly known as: ZOFRAN Take 1 tablet (4 mg total) by mouth every 6 (six) hours as needed for nausea. What changed: when to take this   ondansetron 4 MG tablet Commonly known as: ZOFRAN Take 1 tablet (4 mg total) by mouth every 6 (six) hours as needed for nausea. What changed: You were already taking a medication with the same name, and this prescription was added. Make sure you understand how and when to take each.   oxyCODONE 5 MG immediate release tablet Commonly known as: Oxy IR/ROXICODONE Take 1-2 tablets (5-10 mg total) by mouth every 4 (four) hours as needed for moderate pain, severe pain or breakthrough pain. What changed:  how much to take when to take this   pantoprazole 40 MG tablet Commonly known as: Protonix Take 1 tablet (40 mg total) by mouth daily. What changed: when to take this   polyethylene glycol 17 g packet Commonly known as: MIRALAX / GLYCOLAX Take 17 g by mouth daily.   senna-docusate 8.6-50 MG tablet Commonly known as: Senokot-S Take 1 tablet by mouth at bedtime. What changed: when to take this   True Metrix Blood Glucose Test test strip Generic drug: glucose blood TEST BLOOD SUGAR TWICE DAILY   True Metrix Meter w/Device Kit USE AS DIRECTED               Durable Medical Equipment  (From admission, onward)           Start     Ordered   02/09/23 1759  For home use only DME oxygen  Once       Question Answer Comment  Length  of Need Lifetime   Mode or (Route) Nasal cannula   Liters per Minute 3   Frequency Continuous (stationary and portable oxygen unit needed)   Oxygen conserving device Yes   Oxygen delivery system Gas      02/09/23 1758            Follow-up Information     Baxley, Cresenciano Lick, MD Follow up.  Specialty: Internal Medicine Contact information: [address] [place] [phone]         Truitt Merle, MD Follow up.   Specialties: Hematology, Oncology Contact information: [place] [address] [phone]                Allergies  Allergen Reactions   Lisinopril Cough    Consultations: Oncology palliative care   Procedures/Studies: IR IMAGING GUIDED PORT INSERTION  Result Date: 02/07/2023 INDICATION: 82 year old female with history of pancreatic cancer requiring central venous access for chemotherapy administration. EXAM: IMPLANTED PORT A CATH PLACEMENT WITH ULTRASOUND AND FLUOROSCOPIC GUIDANCE COMPARISON:  None Available. MEDICATIONS: None. ANESTHESIA/SEDATION: Moderate (conscious) sedation was employed during this procedure. A total of Versed 2 mg and Fentanyl 100 mcg was administered intravenously. Moderate Sedation Time: 14 minutes. The patient's level of consciousness and vital signs were monitored continuously by radiology nursing throughout the procedure under my direct supervision. CONTRAST:  None FLUOROSCOPY TIME:  Two mGy COMPLICATIONS: None immediate. PROCEDURE: The procedure, risks, benefits, and alternatives were explained to the patient. Questions regarding the procedure were encouraged and answered. The patient understands and consents to the procedure. The right neck and chest were prepped with chlorhexidine in a sterile fashion, and a sterile drape was applied covering the operative field. Maximum barrier sterile technique with sterile gowns and gloves were used for the procedure. A timeout was performed prior to  the initiation of the procedure. Ultrasound was used to examine the jugular vein which was compressible and free of internal echoes. A skin marker was used to demarcate the planned venotomy and port pocket incision sites. Local anesthesia was provided to these sites and the subcutaneous tunnel track with 1% lidocaine with 1:100,000 epinephrine. A small incision was created at the jugular access site and blunt dissection was performed of the subcutaneous tissues. Under ultrasound guidance, the jugular vein was accessed with a 21 ga micropuncture needle and an 0.018" wire was inserted to the superior vena cava. Real-time ultrasound guidance was utilized for vascular access including the acquisition of a permanent ultrasound image documenting patency of the accessed vessel. A 5 Fr micopuncture set was then used, through which a 0.035" Rosen wire was passed under fluoroscopic guidance into the inferior vena cava. An 8 Fr dilator was then placed over the wire. A subcutaneous port pocket was then created along the upper chest wall utilizing a combination of sharp and blunt dissection. The pocket was irrigated with sterile saline, packed with gauze, and observed for hemorrhage. A single lumen "ISP" sized power injectable port was chosen for placement. The 8 Fr catheter was tunneled from the port pocket site to the venotomy incision. The port was placed in the pocket. The external catheter was trimmed to appropriate length. The dilator was exchanged for an 8 Fr peel-away sheath under fluoroscopic guidance. The catheter was then placed through the sheath and the sheath was removed. Final catheter positioning was confirmed and documented with a fluoroscopic spot radiograph. The port was accessed with a Huber needle, aspirated, and flushed with heparinized saline. The deep dermal layer of the port pocket incision was closed with interrupted 3-0 Vicryl suture. Dermabond was then placed over the port pocket and neck incisions.  The patient tolerated the procedure well without immediate post procedural complication. FINDINGS: After catheter placement, the tip lies within the superior cavoatrial junction. The catheter aspirates and flushes normally and is ready for immediate use. IMPRESSION: Successful placement of a power injectable Port-A-Cath via the  right internal jugular vein. The catheter is ready for immediate use. Ruthann Cancer, MD [facility] Electronically Signed   By: Ruthann Cancer M.D.   On: 02/07/2023 14:18   CT Angio Chest PE W and/or Wo Contrast  Result Date: 02/06/2023 CLINICAL DATA:  SOB EXAM: CT ANGIOGRAPHY CHEST WITH CONTRAST TECHNIQUE: Multidetector CT imaging of the chest was performed using the standard protocol during bolus administration of intravenous contrast. Multiplanar CT image reconstructions and MIPs were obtained to evaluate the vascular anatomy. RADIATION DOSE REDUCTION: This exam was performed according to the departmental dose-optimization program which includes automated exposure control, adjustment of the mA and/or kV according to patient size and/or use of iterative reconstruction technique. CONTRAST:  75 mL OMNIPAQUE IOHEXOL 350 MG/ML SOLN COMPARISON:  01/13/2023 FINDINGS: Cardiovascular: Satisfactory opacification of the pulmonary arteries to the segmental level. No evidence of pulmonary embolism. Normal heart size. No pericardial effusion. Atheromatous changes of the aorta and coronary arteries. No aortic aneurysm or dissection identified. Mediastinum/Nodes: Right paratracheal node measures 1 cm. Left prevascular node measures 1 cm. Superior mediastinal structures unremarkable. Lungs/Pleura: There is extensive diffuse bilateral nodular interstitial process with innumerable lesions. This is consistent with an atypical infection or metastatic disease and there has been no appreciable interval change compared to the prior study from January  2024. Upper Abdomen: Ill-defined mass pancreatic head not fully imaged on this study. Musculoskeletal: Thoracic degenerative changes noted. No osteolytic or osteoblastic lesions identified. Review of the MIP images confirms the above findings. IMPRESSION: 1. No PE, aneurysm or dissection. 2. Extensive nodular interstitial process throughout the lungs consistent with atypical infection versus metastatic disease without definite interval change. 3. Nonspecific mildly prominent mediastinal nodes. 4. Pancreatic head mass that was not fully imaged on this study. Electronically Signed   By: Sammie Bench M.D.   On: 02/06/2023 12:33   DG Chest Port 1 View  Result Date: 02/06/2023 CLINICAL DATA:  Dyspnea EXAM: PORTABLE CHEST 1 VIEW COMPARISON:  02/04/2023 FINDINGS: The heart size and mediastinal contours are within normal limits. Diffuse bilateral interstitial pulmonary opacity. The visualized skeletal structures are unremarkable. IMPRESSION: Diffuse bilateral interstitial pulmonary opacity, consistent with edema or atypical/viral infection. No new or focal airspace opacity. Electronically Signed   By: Delanna Ahmadi M.D.   On: 02/06/2023 10:20   DG CHEST PORT 1 VIEW  Result Date: 02/04/2023 CLINICAL DATA:  SOB EXAM: PORTABLE CHEST 1 VIEW COMPARISON:  02/03/2023 FINDINGS: Diffuse interstitial and alveolar opacity with no pneumothorax or pleural effusion identified. Unremarkable cardiac silhouette. IMPRESSION: Diffuse interstitial and alveolar edema versus bilateral pneumonia. Electronically Signed   By: Sammie Bench M.D.   On: 02/04/2023 08:01   DG CHEST PORT 1 VIEW  Result Date: 02/03/2023 CLINICAL DATA:  sob shortness of breath EXAM: PORTABLE CHEST - 1 VIEW COMPARISON:  02/02/2023 FINDINGS: Scattered interstitial and airspace opacities throughout both lungs, involving bases more than apices, grossly stable compared to the previous radiograph. Heart size and mediastinal contours are within normal limits.  Aortic Atherosclerosis (ICD10-170.0). No effusion. Visualized bones unremarkable.  Surgical clips right breast. IMPRESSION: Stable bilateral interstitial and airspace opacities. No acute findings. Electronically Signed   By: Lucrezia Europe M.D.   On: 02/03/2023 08:18   DG CHEST PORT 1 VIEW  Result Date: 02/02/2023 CLINICAL DATA:  Shortness of breath EXAM: PORTABLE CHEST 1 VIEW COMPARISON:  02/01/2023 and prior studies FINDINGS: The cardiomediastinal silhouette is unchanged. Bilateral interstitial/mild airspace opacities again noted, slightly increased on the RIGHT. There  is no evidence of pneumothorax or large pleural effusion. No other changes are noted. IMPRESSION: Bilateral interstitial/mild airspace opacities, slightly increased on the RIGHT. Electronically Signed   By: Margarette Canada M.D.   On: 02/02/2023 08:25   DG CHEST PORT 1 VIEW  Result Date: 02/01/2023 CLINICAL DATA:  Shortness of breath.  Pneumonia. EXAM: PORTABLE CHEST 1 VIEW COMPARISON:  01/31/2023 FINDINGS: Heart size is stable. Diffuse interstitial prominence noted. Mild airspace disease is seen in both lower lungs, without significant change. No evidence of pleural effusion. IMPRESSION: No significant change in diffuse interstitial prominence and mild bibasilar airspace disease. Electronically Signed   By: Marlaine Hind M.D.   On: 02/01/2023 09:03   DG CHEST PORT 1 VIEW  Result Date: 01/31/2023 CLINICAL DATA:  Shortness of breath, dyspnea EXAM: PORTABLE CHEST 1 VIEW COMPARISON:  01/30/2023 FINDINGS: Bilateral lower lobe airspace disease and mild heterogeneous upper lobe airspace disease concerning for multilobar pneumonia. Right lower lobe airspace disease has improved compared with 01/24/2023. No pleural effusion or pneumothorax. Heart and mediastinal contours are unremarkable. No acute osseous abnormality. Osteoarthritis of bilateral glenohumeral joints. IMPRESSION: 1. Bilateral lower lobe airspace disease and mild heterogeneous upper lobe  airspace disease concerning for multilobar pneumonia. Electronically Signed   By: Kathreen Devoid M.D.   On: 01/31/2023 12:20   US Abdomen Limited RUQ (LIVER/GB)  Result Date: 01/30/2023 CLINICAL DATA:  Abnormal liver function tests. EXAM: ULTRASOUND ABDOMEN LIMITED RIGHT UPPER QUADRANT COMPARISON:  October 03, 2014.  November 14, 2014. FINDINGS: Gallbladder: Status post cholecystectomy. Common bile duct: Diameter: 4 mm which is within normal limits. Liver: No focal lesion identified. Nodular hepatic contours are noted suggesting cirrhosis. Heterogeneous echotexture of hepatic parenchyma is noted. Portal vein is patent on color Doppler imaging with normal direction of blood flow towards the liver. Other: None. IMPRESSION: Status post cholecystectomy. Findings consistent with hepatic cirrhosis. No definite focal sonographic hepatic abnormality seen. Electronically Signed   By: Marijo Conception M.D.   On: 01/30/2023 15:30   DG CHEST PORT 1 VIEW  Result Date: 01/30/2023 CLINICAL DATA:  [id number] with shortness of breath. EXAM: PORTABLE CHEST 1 VIEW COMPARISON:  Portable chest yesterday at 9:29 a.m. FINDINGS: 4:55 a.m. There is interval worsening of patchy heterogeneous airspace disease in the lower lung fields. Small pleural effusions appear similar. The upper lung fields are generally clear. There is mild cardiomegaly without findings of acute CHF. The aorta is tortuous with calcification in the transverse segment. Stable mediastinum. Slight thoracic dextroscoliosis.  No new osseous abnormality. IMPRESSION: Interval worsening of patchy heterogeneous airspace disease in the lower lung fields bilaterally. Small pleural effusions. No findings of acute CHF or other significant change. Electronically Signed   By: Telford Nab M.D.   On: 01/30/2023 07:18   DG CHEST PORT 1 VIEW  Result Date: 01/29/2023 CLINICAL DATA:  Short of breath EXAM: PORTABLE CHEST 1 VIEW COMPARISON:  Radiograph 01/24/2023, CT 01/13/2023 FINDINGS:  Normal cardiac silhouette. Patchy bilateral airspace opacities appears slightly improved. No focal consolidation. No pneumothorax. No acute osseous abnormality. IMPRESSION: Slight improvement in patchy bilateral airspace opacities. Electronically Signed   By: Suzy Bouchard M.D.   On: 01/29/2023 09:59   ECHOCARDIOGRAM COMPLETE  Result Date: 01/26/2023    ECHOCARDIOGRAM REPORT   Patient Name:   MARIHANNA BETSCHART Date of Exam: 01/26/2023 Medical Rec #:  [id number]         Height:       68.0 in Accession #:    [id number]  Weight:       193.2 lb Date of Birth:  29-Mar-1941         BSA:          2.014 m Patient Age:    82 years          BP:           144/87 mmHg Patient Gender: F                 HR:           88 bpm. Exam Location:  Inpatient Procedure: 2D Echo Indications:    CHF  History:        Patient has prior history of Echocardiogram examinations, most                 recent 11/22/2022. Risk Factors:Diabetes and Hypertension.  Sonographer:    Harvie Junior Referring Phys: [id number] DANIEL V THOMPSON  Sonographer Comments: Image acquisition challenging due to respiratory motion. IMPRESSIONS  1. Left ventricular ejection fraction, by estimation, is 60 to 65%. The left ventricle has normal function. The left ventricle has no regional wall motion abnormalities. There is mild concentric left ventricular hypertrophy. Left ventricular diastolic parameters are consistent with Grade I diastolic dysfunction (impaired relaxation).  2. Right ventricular systolic function is normal. The right ventricular size is normal. There is normal pulmonary artery systolic pressure.  3. No evidence of mitral valve regurgitation.  4. The aortic valve is grossly normal. Aortic valve regurgitation is not visualized.  5. The inferior vena cava is normal in size with greater than 50% respiratory variability, suggesting right atrial pressure of 3 mmHg. Comparison(s): No significant change from prior study. FINDINGS  Left Ventricle: Left  ventricular ejection fraction, by estimation, is 60 to 65%. The left ventricle has normal function. The left ventricle has no regional wall motion abnormalities. The left ventricular internal cavity size was normal in size. There is  mild concentric left ventricular hypertrophy. Left ventricular diastolic parameters are consistent with Grade I diastolic dysfunction (impaired relaxation). Right Ventricle: The right ventricular size is normal. Right ventricular systolic function is normal. There is normal pulmonary artery systolic pressure. The tricuspid regurgitant velocity is 2.71 m/s, and with an assumed right atrial pressure of 3 mmHg,  the estimated right ventricular systolic pressure is [id number] mmHg. Left Atrium: Left atrial size was normal in size. Right Atrium: Right atrial size was normal in size. Pericardium: There is no evidence of pericardial effusion. Mitral Valve: No evidence of mitral valve regurgitation. Tricuspid Valve: Tricuspid valve regurgitation is mild. Aortic Valve: The aortic valve is grossly normal. Aortic valve regurgitation is not visualized. Aortic valve mean gradient measures 3.0 mmHg. Aortic valve peak gradient measures 5.6 mmHg. Aortic valve area, by VTI measures 2.78 cm. Pulmonic Valve: Pulmonic valve regurgitation is not visualized. Aorta: The aortic root and ascending aorta are structurally normal, with no evidence of dilitation. Venous: The inferior vena cava is normal in size with greater than 50% respiratory variability, suggesting right atrial pressure of 3 mmHg. IAS/Shunts: No atrial level shunt detected by color flow Doppler.  LEFT VENTRICLE PLAX 2D LVIDd:         3.50 cm     Diastology LVIDs:         2.40 cm     LV e' medial:    5.33 cm/s LV PW:         1.10 cm     LV E/e' medial:  12.5 LV IVS:  1.30 cm     LV e' lateral:   7.29 cm/s LVOT diam:     2.00 cm     LV E/e' lateral: 9.2 LV SV:         61 LV SV Index:   30 LVOT Area:     3.14 cm                             3D  Volume EF: LV Volumes (MOD)           3D EF:        55 % LV vol d, MOD A2C: 51.5 ml LV EDV:       119 ml LV vol d, MOD A4C: 71.4 ml LV ESV:       54 ml LV vol s, MOD A2C: 20.2 ml LV SV:        66 ml LV vol s, MOD A4C: 27.8 ml LV SV MOD A2C:     31.3 ml LV SV MOD A4C:     71.4 ml LV SV MOD BP:      37.2 ml RIGHT VENTRICLE RV S prime:     17.00 cm/s TAPSE (M-mode): 1.9 cm LEFT ATRIUM           Index LA diam:      3.20 cm 1.59 cm/m LA Vol (A4C): 40.9 ml 20.31 ml/m  AORTIC VALVE                    PULMONIC VALVE AV Area (Vmax):    2.60 cm     PV Vmax:       0.81 m/s AV Area (Vmean):   2.56 cm     PV Peak grad:  2.7 mmHg AV Area (VTI):     2.78 cm AV Vmax:           118.00 cm/s AV Vmean:          81.400 cm/s AV VTI:            0.220 m AV Peak Grad:      5.6 mmHg AV Mean Grad:      3.0 mmHg LVOT Vmax:         97.70 cm/s LVOT Vmean:        66.400 cm/s LVOT VTI:          0.195 m LVOT/AV VTI ratio: 0.89  AORTA Ao Root diam: 3.40 cm Ao Asc diam:  3.50 cm MITRAL VALVE               TRICUSPID VALVE MV Area (PHT): 3.99 cm    TR Peak grad:   29.4 mmHg MV Decel Time: 190 msec    TR Vmax:        271.00 cm/s MV E velocity: 66.80 cm/s MV A velocity: 95.50 cm/s  SHUNTS MV E/A ratio:  0.70        Systemic VTI:  0.20 m                            Systemic Diam: 2.00 cm Phineas Inches Electronically signed by Phineas Inches Signature Date/Time: 01/26/2023/1:19:22 PM    Final    DG Chest Portable 1 View  Result Date: 01/24/2023 CLINICAL DATA:  Shortness of breath, recent pneumonia and PE. EXAM: PORTABLE CHEST 1 VIEW COMPARISON:  Chest x-ray dated 12/06/2022. FINDINGS: Diffuse bilateral interstitial prominence, presumably edema,  and patchy bibasilar airspace opacities. No pleural effusion or pneumothorax is seen. Heart size and mediastinal contours are stable. IMPRESSION: 1. Patchy bibasilar airspace opacities, multifocal pneumonia versus asymmetric pulmonary edema. 2. Bilateral interstitial prominence, also compatible with atypical  pneumonia versus edema. Electronically Signed   By: Franki Cabot M.D.   On: 01/24/2023 10:40   VAS [place] UPPER EXTREMITY VENOUS DUPLEX  Result Date: 01/20/2023 UPPER VENOUS STUDY  Patient Name:  KRISTELLA THRON  Date of Exam:   01/20/2023 Medical Rec #: [id number]          Accession #:    [id number] Date of Birth: 09/22/1941          Patient Gender: F Patient Age:   60 years Exam Location:  [facility] Procedure:      VAS [place] UPPER EXTREMITY VENOUS DUPLEX Referring Phys: PRANAV PATEL --------------------------------------------------------------------------------  Indications: Edema Risk Factors: None identified. Limitations: Poor ultrasound/tissue interface. Comparison Study: No prior studies. Performing Technologist: Oliver Hum RVT  Examination Guidelines: A complete evaluation includes B-mode imaging, spectral Doppler, color Doppler, and power Doppler as needed of all accessible portions of each vessel. Bilateral testing is considered an integral part of a complete examination. Limited examinations for reoccurring indications may be performed as noted.  Right Findings: +----------+------------+---------+-----------+----------+-------+ RIGHT     CompressiblePhasicitySpontaneousPropertiesSummary +----------+------------+---------+-----------+----------+-------+ Subclavian    Full       Yes       Yes                      +----------+------------+---------+-----------+----------+-------+  Left Findings: +----------+------------+---------+-----------+----------+-------+ LEFT      CompressiblePhasicitySpontaneousPropertiesSummary +----------+------------+---------+-----------+----------+-------+ IJV           Full       Yes       Yes                      +----------+------------+---------+-----------+----------+-------+ Subclavian    None       No        No                Acute  +----------+------------+---------+-----------+----------+-------+ Axillary      None        No        No                Acute  +----------+------------+---------+-----------+----------+-------+ Brachial      Full       No        No                       +----------+------------+---------+-----------+----------+-------+ Radial        Full                                          +----------+------------+---------+-----------+----------+-------+ Ulnar         Full                                          +----------+------------+---------+-----------+----------+-------+ Cephalic      Full                                          +----------+------------+---------+-----------+----------+-------+  Basilic       None                                   Acute  +----------+------------+---------+-----------+----------+-------+  Summary:  Right: No evidence of thrombosis in the subclavian.  Left: Findings consistent with acute deep vein thrombosis involving the left subclavian vein and left axillary vein. Findings consistent with acute superficial vein thrombosis involving the left basilic vein.  *See table(s) above for measurements and observations.  Diagnosing physician: Monica Martinez MD Electronically signed by Monica Martinez MD on 01/20/2023 at 5:43:57 PM.    Final    CT Angio Chest PE W and/or Wo Contrast  Result Date: 01/14/2023 CLINICAL DATA:  Abdominal pain. Also concern for pulmonary embolism. EXAM: CT ANGIOGRAPHY CHEST CT ABDOMEN AND PELVIS WITH CONTRAST TECHNIQUE: Multidetector CT imaging of the chest was performed using the standard protocol during bolus administration of intravenous contrast. Multiplanar CT image reconstructions and MIPs were obtained to evaluate the vascular anatomy. Multidetector CT imaging of the abdomen and pelvis was performed using the standard protocol during bolus administration of intravenous contrast. RADIATION DOSE REDUCTION: This exam was performed according to the departmental dose-optimization program which includes  automated exposure control, adjustment of the mA and/or kV according to patient size and/or use of iterative reconstruction technique. CONTRAST:  143m OMNIPAQUE IOHEXOL 350 MG/ML SOLN COMPARISON:  CT abdomen pelvis dated 12/27/2022 and chest CT dated 11/21/2022. FINDINGS: Evaluation is limited due to respiratory motion and streak artifact caused by the patient's arms. CTA CHEST FINDINGS Cardiovascular: There is no cardiomegaly or pericardial effusion. Mild atherosclerotic calcification of the thoracic aorta. No aneurysmal dilatation or dissection. Evaluation of the pulmonary arteries is limited due to respiratory motion and suboptimal visualization of the peripheral branches. No obvious large central pulmonary artery embolus identified. Mediastinum/Nodes: Top-normal bilateral hilar lymph nodes measure 1 cm short axis. The esophagus is grossly unremarkable. No mediastinal fluid collection. Lungs/Pleura: Scattered clusters of ground-glass nodular densities throughout the lungs suspicious for atypical infection. Metastatic disease is not excluded clinical correlation is recommended. There is no pleural effusion or pneumothorax. The central airways are patent. Musculoskeletal: Degenerative changes of the spine and osteopenia. No acute osseous pathology. Review of the MIP images confirms the above findings. CT ABDOMEN and PELVIS FINDINGS No intra-abdominal free air or free fluid. Hepatobiliary: Cirrhosis. Mild biliary dilatation versus periportal edema. Cholecystectomy. Pancreas: Ill-defined soft tissue mass in the region of the head of the pancreas measuring approximately 4.9 x 3.5 cm (22/4). There is atrophy of the body of the pancreas and dilatation of the main pancreatic duct. No active inflammatory changes. Spleen: Normal in size without focal abnormality. Adrenals/Urinary Tract: The adrenal glands unremarkable. Small bilateral renal cysts. There is no hydronephrosis on either side. There is symmetric enhancement  and excretion of contrast by both kidneys. The visualized ureters and urinary bladder appear unremarkable. Stomach/Bowel: Severe colonic diverticulosis without active inflammatory changes. There is no bowel obstruction active inflammation. The appendix is normal. Vascular/Lymphatic: Moderate aortoiliac atherosclerotic disease. The IVC is unremarkable. No portal venous gas. Retroperitoneal adenopathy measures 13 mm to the left of the aorta, 17 mm in the periportal region, and 19 mm in the gastrohepatic vision. Reproductive: Hysterectomy.  No adnexal masses. Other: Small fat containing umbilical and supraumbilical hernia. Musculoskeletal: Degenerative changes of the spine. No acute osseous pathology. Review of the MIP images confirms the above findings. IMPRESSION: 1. No CT evidence of central  pulmonary artery embolus. 2. Scattered clusters of ground-glass nodular densities throughout the lungs may represent atypical infection or metastatic disease. 3. Ill-defined soft tissue mass in the region of the head of the pancreas with atrophy of the body of the pancreas and dilatation of the main pancreatic duct. Findings are concerning for pancreatic malignancy. Further evaluation with MRI with and without contrast recommended. 4. Retroperitoneal adenopathy. 5. Cirrhosis. 6. Severe colonic diverticulosis without active inflammatory changes. No bowel obstruction. Normal appendix. 7.  Aortic Atherosclerosis (ICD10-I70.0). Electronically Signed   By: Anner Crete M.D.   On: 01/14/2023 00:04   CT ABDOMEN PELVIS W CONTRAST  Result Date: 01/14/2023 CLINICAL DATA:  Abdominal pain. Also concern for pulmonary embolism. EXAM: CT ANGIOGRAPHY CHEST CT ABDOMEN AND PELVIS WITH CONTRAST TECHNIQUE: Multidetector CT imaging of the chest was performed using the standard protocol during bolus administration of intravenous contrast. Multiplanar CT image reconstructions and MIPs were obtained to evaluate the vascular anatomy.  Multidetector CT imaging of the abdomen and pelvis was performed using the standard protocol during bolus administration of intravenous contrast. RADIATION DOSE REDUCTION: This exam was performed according to the departmental dose-optimization program which includes automated exposure control, adjustment of the mA and/or kV according to patient size and/or use of iterative reconstruction technique. CONTRAST:  184m OMNIPAQUE IOHEXOL 350 MG/ML SOLN COMPARISON:  CT abdomen pelvis dated 12/27/2022 and chest CT dated 11/21/2022. FINDINGS: Evaluation is limited due to respiratory motion and streak artifact caused by the patient's arms. CTA CHEST FINDINGS Cardiovascular: There is no cardiomegaly or pericardial effusion. Mild atherosclerotic calcification of the thoracic aorta. No aneurysmal dilatation or dissection. Evaluation of the pulmonary arteries is limited due to respiratory motion and suboptimal visualization of the peripheral branches. No obvious large central pulmonary artery embolus identified. Mediastinum/Nodes: Top-normal bilateral hilar lymph nodes measure 1 cm short axis. The esophagus is grossly unremarkable. No mediastinal fluid collection. Lungs/Pleura: Scattered clusters of ground-glass nodular densities throughout the lungs suspicious for atypical infection. Metastatic disease is not excluded clinical correlation is recommended. There is no pleural effusion or pneumothorax. The central airways are patent. Musculoskeletal: Degenerative changes of the spine and osteopenia. No acute osseous pathology. Review of the MIP images confirms the above findings. CT ABDOMEN and PELVIS FINDINGS No intra-abdominal free air or free fluid. Hepatobiliary: Cirrhosis. Mild biliary dilatation versus periportal edema. Cholecystectomy. Pancreas: Ill-defined soft tissue mass in the region of the head of the pancreas measuring approximately 4.9 x 3.5 cm (22/4). There is atrophy of the body of the pancreas and dilatation of  the main pancreatic duct. No active inflammatory changes. Spleen: Normal in size without focal abnormality. Adrenals/Urinary Tract: The adrenal glands unremarkable. Small bilateral renal cysts. There is no hydronephrosis on either side. There is symmetric enhancement and excretion of contrast by both kidneys. The visualized ureters and urinary bladder appear unremarkable. Stomach/Bowel: Severe colonic diverticulosis without active inflammatory changes. There is no bowel obstruction active inflammation. The appendix is normal. Vascular/Lymphatic: Moderate aortoiliac atherosclerotic disease. The IVC is unremarkable. No portal venous gas. Retroperitoneal adenopathy measures 13 mm to the left of the aorta, 17 mm in the periportal region, and 19 mm in the gastrohepatic vision. Reproductive: Hysterectomy.  No adnexal masses. Other: Small fat containing umbilical and supraumbilical hernia. Musculoskeletal: Degenerative changes of the spine. No acute osseous pathology. Review of the MIP images confirms the above findings. IMPRESSION: 1. No CT evidence of central pulmonary artery embolus. 2. Scattered clusters of ground-glass nodular densities throughout the lungs may represent atypical infection or  metastatic disease. 3. Ill-defined soft tissue mass in the region of the head of the pancreas with atrophy of the body of the pancreas and dilatation of the main pancreatic duct. Findings are concerning for pancreatic malignancy. Further evaluation with MRI with and without contrast recommended. 4. Retroperitoneal adenopathy. 5. Cirrhosis. 6. Severe colonic diverticulosis without active inflammatory changes. No bowel obstruction. Normal appendix. 7.  Aortic Atherosclerosis (ICD10-I70.0). Electronically Signed   By: Anner Crete M.D.   On: 01/14/2023 00:04   (Echo, Carotid, EGD, Colonoscopy, ERCP)    Subjective: She is resting in bed and anxious to go home  Discharge Exam: Vitals:   02/11/23 2117 02/12/23 0536  BP:  (!) 144/91 (!) 151/95  Pulse: 100 (!) 101  Resp: 17 17  Temp: 98.3 F (36.8 C) 98.2 F (36.8 C)  SpO2: 99% 90%   Vitals:   02/11/23 0608 02/11/23 1154 02/11/23 2117 02/12/23 0536  BP: 128/75 121/76 (!) 144/91 (!) 151/95  Pulse: 91 93 100 (!) 101  Resp: 18 17 17 17  $ Temp: 98.9 F (37.2 C) 98 F (36.7 C) 98.3 F (36.8 C) 98.2 F (36.8 C)  TempSrc: Oral Oral Oral Oral  SpO2: 96% 91% 99% 90%  Weight:      Height:        General: Pt is alert, awake, not in acute distress Cardiovascular: RRR, S1/S2 +, no rubs, no gallops Respiratory: CTA bilaterally, no wheezing, no rhonchi Abdominal: Soft, NT, ND, bowel sounds + Extremities: no edema, no cyanosis    The results of significant diagnostics from this hospitalization (including imaging, microbiology, ancillary and laboratory) are listed below for reference.     Microbiology: Recent Results (from the past 240 hour(s))  Blood culture (routine x 2)     Status: None   Collection Time: 02/06/23 10:12 AM   Specimen: BLOOD  Result Value Ref Range Status   Specimen Description   Final    BLOOD BLOOD LEFT ARM Performed at [facility] [address] [place], [place] [address]    Special Requests   Final    BOTTLES DRAWN AEROBIC AND ANAEROBIC Blood Culture results may not be optimal due to an inadequate volume of blood received in culture bottles Performed at [facility] [address], [place], [place] [address]    Culture   Final    NO GROWTH 5 DAYS Performed at [facility], [place] [address], [place], [place] [address]    Report Status 02/11/2023 FINAL  Final  Blood culture (routine x 2)     Status: None   Collection Time: 02/06/23 10:12 AM   Specimen: BLOOD  Result Value Ref Range Status   Specimen Description   Final    BLOOD LEFT ANTECUBITAL Performed at [facility] [address] [place], [place] [address]    Special Requests   Final    BOTTLES  DRAWN AEROBIC AND ANAEROBIC Blood Culture results may not be optimal due to an inadequate volume of blood received in culture bottles Performed at [facility] [address], [place], [place] [address]    Culture   Final    NO GROWTH 5 DAYS Performed at [facility], [place] [address], [place], [place] [address]    Report Status 02/11/2023 FINAL  Final  Surgical pcr screen     Status: None   Collection Time: 02/07/23  6:28 AM   Specimen: Nasal Mucosa; Nasal Swab  Result Value Ref  Range Status   MRSA, PCR NEGATIVE NEGATIVE Final   Staphylococcus aureus NEGATIVE NEGATIVE Final    Comment: (NOTE) The Xpert SA Assay (FDA approved for NASAL specimens in patients 73 years of age and older), is one component of a comprehensive surveillance program. It is not intended to diagnose infection nor to guide or monitor treatment. Performed at [facility], [place] [address] [place], [place] [address]      Labs: BNP (last 3 results) Recent Labs    01/24/23 1045 02/06/23 1011  BNP 64.5 [id number]   Basic Metabolic Panel: Recent Labs  Lab 02/07/23 0507 02/08/23 0435 02/09/23 0252 02/10/23 0349 02/11/23 0407  NA 136 137 138 136 136  K 3.7 3.3* 3.5 4.0 4.1  CL 101 104 104 104 103  CO2 23 24 24 25 24  $ GLUCOSE 136* 124* 147* 130* 157*  BUN 25* 20 16 12 13  $ CREATININE 1.24* 1.03* 0.77 0.82 0.75  CALCIUM 8.0* 7.7* 7.9* 7.9* 7.8*   Liver Function Tests: Recent Labs  Lab 02/06/23 1011  AST 90*  ALT 61*  ALKPHOS 243*  BILITOT 1.3*  PROT 7.0  ALBUMIN 2.6*   No results for input(s): "LIPASE", "AMYLASE" in the last 168 hours. No results for input(s): "AMMONIA" in the last 168 hours. CBC: Recent Labs  Lab 02/06/23 1011 02/07/23 0507 02/08/23 0435 02/09/23 0252 02/10/23 0349 02/11/23 0407  WBC 12.7* 8.5 7.4 7.6 8.2 7.6  NEUTROABS 11.0*  --   --   --   --   --   HGB 8.6* 7.2* 6.7* 9.6* 9.9* 9.5*  HCT 28.5* 24.3* 21.6* 30.3* 31.0* 30.0*   MCV 91.6 92.7 91.1 88.9 89.3 90.4  PLT 253 193 173 169 191 156   Cardiac Enzymes: No results for input(s): "CKTOTAL", "CKMB", "CKMBINDEX", "TROPONINI" in the last 168 hours. BNP: Invalid input(s): "POCBNP" CBG: Recent Labs  Lab 02/11/23 1151 02/11/23 1614 02/11/23 2120 02/12/23 0742 02/12/23 1151  GLUCAP 197* 125* 171* 140* 180*   D-Dimer No results for input(s): "DDIMER" in the last 72 hours. Hgb A1c No results for input(s): "HGBA1C" in the last 72 hours. Lipid Profile No results for input(s): "CHOL", "HDL", "LDLCALC", "TRIG", "CHOLHDL", "LDLDIRECT" in the last 72 hours. Thyroid function studies No results for input(s): "TSH", "T4TOTAL", "T3FREE", "THYROIDAB" in the last 72 hours.  Invalid input(s): "FREET3" Anemia work up No results for input(s): "VITAMINB12", "FOLATE", "FERRITIN", "TIBC", "IRON", "RETICCTPCT" in the last 72 hours. Urinalysis    Component Value Date/Time   COLORURINE YELLOW 01/13/2023 2139   APPEARANCEUR CLEAR 01/13/2023 2139   LABSPEC 1.016 01/13/2023 2139   PHURINE 6.0 01/13/2023 2139   GLUCOSEU NEGATIVE 01/13/2023 2139   HGBUR NEGATIVE 01/13/2023 2139   BILIRUBINUR NEGATIVE 01/13/2023 2139   BILIRUBINUR neg 11/29/2022 0957   KETONESUR NEGATIVE 01/13/2023 2139   PROTEINUR 30 (A) 01/13/2023 2139   UROBILINOGEN 0.2 11/29/2022 0957   UROBILINOGEN 2.0 (H) 11/22/2008 0930   NITRITE NEGATIVE 01/13/2023 2139   LEUKOCYTESUR NEGATIVE 01/13/2023 2139   Sepsis Labs Recent Labs  Lab 02/08/23 0435 02/09/23 0252 02/10/23 0349 02/11/23 0407  WBC 7.4 7.6 8.2 7.6   Microbiology Recent Results (from the past 240 hour(s))  Blood culture (routine x 2)     Status: None   Collection Time: 02/06/23 10:12 AM   Specimen: BLOOD  Result Value Ref Range Status   Specimen Description   Final    BLOOD BLOOD LEFT ARM Performed at [facility], [place] [address] [place],  [place] [address]    Special Requests   Final    BOTTLES DRAWN AEROBIC  AND ANAEROBIC Blood Culture results may not be optimal due to an inadequate volume of blood received in culture bottles Performed at [facility], [place] [address], [place], [place] [address]    Culture   Final    NO GROWTH 5 DAYS Performed at [facility], [place] [address] [place], [place] [address]    Report Status 02/11/2023 FINAL  Final  Blood culture (routine x 2)     Status: None   Collection Time: 02/06/23 10:12 AM   Specimen: BLOOD  Result Value Ref Range Status   Specimen Description   Final    BLOOD LEFT ANTECUBITAL Performed at [facility] [address], [place], [place] [address]    Special Requests   Final    BOTTLES DRAWN AEROBIC AND ANAEROBIC Blood Culture results may not be optimal due to an inadequate volume of blood received in culture bottles Performed at [facility] [address], [place], [place] [address]    Culture   Final    NO GROWTH 5 DAYS Performed at [facility], [place] [address], [place], [place] [address]    Report Status 02/11/2023 FINAL  Final  Surgical pcr screen     Status: None   Collection Time: 02/07/23  6:28 AM   Specimen: Nasal Mucosa; Nasal Swab  Result Value Ref Range Status   MRSA, PCR NEGATIVE NEGATIVE Final   Staphylococcus aureus NEGATIVE NEGATIVE Final    Comment: (NOTE) The Xpert SA Assay (FDA approved for NASAL specimens in patients 64 years of age and older), is one component of a comprehensive surveillance program. It is not intended to diagnose infection nor to guide or monitor treatment. Performed at [facility], [place] [address] [place], [place] [address]      Time coordinating discharge: 39 minutes  SIGNED:  Georgette Shell, MD  [facility] 02/12/2023, 4:26 PM

## 2023-02-12 NOTE — Care Management Important Message (Signed)
Important Message  Patient Details IM Letter given. Name: Victoria [name] MRN: [id number] Date of Birth: 03/14/1941   Medicare Important Message Given:  Yes     Kerin Salen 02/12/2023, 11:48 AM

## 2023-02-12 NOTE — TOC Transition Note (Signed)
Transition of Care [facility]) - CM/SW Discharge Note   Patient Details  Name: RENEZMEE CLIMER MRN: [id number] Date of Birth: 1941-09-04  Transition of Care [facility]) CM/SW Contact:  Lennart Pall, LCSW Phone Number: 02/12/2023, 12:43 PM   Clinical Narrative:    Alerted by RN that pt requiring home O2 and documention/ orders placed.  Pt aware and agreeable and no DME agency preference.  Order placed with [place] and portable tank to be delivered to hospital room.  No further TOC needs.   Final next level of care: Home/Self Care Barriers to Discharge: No Barriers Identified   Patient Goals and CMS Choice      Discharge Placement                         Discharge Plan and Services Additional resources added to the After Visit Summary for                  DME Arranged: Oxygen DME Agency: [employer] Date DME Agency Contacted: 02/12/23 Time DME Agency Contacted: [id number] Representative spoke with at DME Agency: [place] ([place]) Interventions SDOH Screenings   Food Insecurity: No Food Insecurity (02/07/2023)  Housing: Low Risk  (02/07/2023)  Transportation Needs: No Transportation Needs (02/07/2023)  Utilities: Not At Risk (02/07/2023)  Alcohol Screen: Low Risk  (12/03/2021)  Depression (PHQ2-9): Low Risk  (12/04/2022)  Financial Resource Strain: Medium Risk (12/03/2021)  Physical Activity: Sufficiently Active (12/03/2021)  Social Connections: Moderately Isolated (12/03/2021)  Stress: No Stress Concern Present (12/03/2021)  Tobacco Use: Low Risk  (02/11/2023)     Readmission Risk Interventions    02/12/2023   12:42 PM 02/10/2023   10:17 AM 01/27/2023   12:35 PM  Readmission Risk Prevention Plan  Transportation Screening Complete  Complete  PCP or Specialist Appt within 3-5 Days   Complete  HRI or [place]   Complete  Social Work Consult for [place] Planning/Counseling   Complete  Palliative Care Screening   Not  Applicable  Medication Review Press photographer) Complete Complete Complete  PCP or Specialist appointment within 3-5 days of discharge Complete Complete   HRI or Home Care Consult Complete Complete   SW Recovery Care/Counseling Consult Complete Complete   Palliative Care Screening Complete Complete   [place] Not Applicable Not Applicable

## 2023-02-13 ENCOUNTER — Other Ambulatory Visit: Payer: Self-pay

## 2023-02-13 ENCOUNTER — Other Ambulatory Visit: Payer: Self-pay | Admitting: Genetic Counselor

## 2023-02-13 NOTE — Addendum Note (Signed)
Addended by: Katheren Shams on: 02/13/2023 08:56 AM   Modules accepted: Orders

## 2023-02-14 ENCOUNTER — Other Ambulatory Visit: Payer: Self-pay

## 2023-02-14 ENCOUNTER — Telehealth: Payer: Self-pay | Admitting: *Deleted

## 2023-02-14 ENCOUNTER — Telehealth: Payer: Self-pay | Admitting: Internal Medicine

## 2023-02-14 DIAGNOSIS — C259 Malignant neoplasm of pancreas, unspecified: Secondary | ICD-10-CM

## 2023-02-14 NOTE — Progress Notes (Signed)
  Care Coordination  Outreach Note  02/14/2023 Name: Victoria [name] MRN: [id number] DOB: 10-03-1941   Care Coordination Outreach Attempts: An unsuccessful telephone outreach was attempted today to offer the patient information about available care coordination services as a benefit of their health plan.   Follow Up Plan:  Additional outreach attempts will be made to offer the patient care coordination information and services.   Encounter Outcome:  No Answer  [place]  Direct Dial: [phone]

## 2023-02-14 NOTE — Progress Notes (Signed)
I was asked to reach out to Ms Victoria [name] by our scheduling team.  Ms Victoria [name] told them she is not able to take care of herself and needs help.Ms Victoria [name] states she has oxygen but she just can not take care of her self and needs to be placed where she can get help.  She does have food in the house and she is able to get to th bathroom.  She is dyspnic on exertion with oxygen.  I explained this is related to the cancer in her lungs.  I told her we are able to send a referral for Home health services.  I also told her we can refer her to hospice but she would not be able to receive chemotherapy for her cancer.  She did not have any comment to that.  She did agree to a telephone visit with Dr Burr Medico on Monday 2/26.  Dr Burr Medico is aware of the above.  Tammi Sou will be sending referral for home health services.

## 2023-02-14 NOTE — Progress Notes (Unsigned)
[place]   [phone] [phone]   Clinic Follow up Note   Patient Care Team: Elby Showers, MD as PCP - General (Internal Medicine) Inda Castle, MD (Inactive) as Consulting Physician (Gastroenterology) Truitt Merle, MD as Consulting Physician (Oncology) Care, [facility] as Consulting Physician ([place])  Date of Service:  02/14/2023  I connected with Victoria [name] on 02/14/2023 at  1:20 PM EST by {Blank single:19197::"video enabled telemedicine visit","telephone visit"} and verified that I am speaking with the correct person using two identifiers.  I discussed the limitations, risks, security and privacy concerns of performing an evaluation and management service by telephone and the availability of in person appointments. I also discussed with the patient that there may be a patient responsible charge related to this service. The patient expressed understanding and agreed to proceed.   Other persons participating in the visit and their role in the encounter:  ***  Patient's location:  *** Provider's location:  ***  CHIEF COMPLAINT: f/u of Pancreatic Cancer  CURRENT THERAPY:   Gemcitabine D1,8,15 q28d x 4 Cycles  ASSESSMENT & PLAN: *** Victoria [name] is a 82 y.o. female with   ***   ***  No problem-specific Assessment & Plan notes found for this encounter.    SUMMARY OF ONCOLOGIC HISTORY: Oncology History  Pancreatic cancer ([place])  01/09/2023 Pathology Results    FINAL MICROSCOPIC DIAGNOSIS:   A. STOMACH, BIOPSY:  - GASTRIC, TRANSITIONAL TYPE MUCOSA WITH FOCAL INTESTINAL METAPLASIA  WITHOUT DYSPLASIA  - ANTRAL AND OXYNTIC MUCOSAE WITH REACTIVE (CHEMICAL) GASTROPATHY WITH  SLIGHT   CHRONIC INFLAMMATION  - MILD, EARLY FUNDIC GLAND POLYP LIKE CHANGES  - IMMUNOHISTOCHEMICAL STAIN FOR HELICOBACTER ORGANISMS IS NEGATIVE  - NEGATIVE FOR MALIGNANCY    01/14/2023 Initial Diagnosis   Pancreatic cancer  ([place])   02/21/2023 -  Chemotherapy   Patient is on Treatment Plan : PANCREAS Gemcitabine D1,8,15 (1000) q28d x 4 Cycles        INTERVAL HISTORY: *** Victoria [name] was contacted for a follow up of ***. {Sh/H}e was last seen by me on ***.    All other systems were reviewed with the patient and are negative.  MEDICAL HISTORY:  Past Medical History:  Diagnosis Date   Abnormal finding on Pap smear    Allergy    SEASONAL   Anxiety    Breast cancer ([place]) 2000   right breast lumptectomy, radiation done   Cirrhosis ([place]) 11/2012   resolved per pt   Diabetes mellitus type 2, controlled ([place])    Hepatitis C    took tx for    Hypertension    Obesity    Thrombocytopenia ([place]) 11/2012    SURGICAL HISTORY: Past Surgical History:  Procedure Laterality Date   ANTERIOR AND POSTERIOR REPAIR N/A 02/28/2021   Procedure: ANTERIOR (CYSTOCELE) AND POSTERIOR REPAIR (RECTOCELE);  Surgeon: Cheri Fowler, MD;  Location: [facility];  Service: Gynecology;  Laterality: N/A;   BIOPSY  01/09/2023   Procedure: BIOPSY;  Surgeon: Irving Copas., MD;  Location: [facility];  Service: Gastroenterology;;   BREAST BIOPSY Left 09/09/2016   BREAST LUMPECTOMY  right   BREAST LUMPECTOMY     BREAST REDUCTION SURGERY  2005   CATARACT EXTRACTION, BILATERAL  2014   CHOLECYSTECTOMY N/A 03/30/2013   Procedure: LAPAROSCOPIC CHOLECYSTECTOMY WITH INTRAOPERATIVE CHOLANGIOGRAM;  Surgeon: Shann Medal, MD;  Location: [facility];  Service: General;  Laterality: N/A;   COLONOSCOPY  2021  CYSTOSCOPY N/A 02/28/2021   Procedure: CYSTOSCOPY;  Surgeon: Cheri Fowler, MD;  [facility];  Service: Gynecology;  Laterality: N/A;   CYSTOSCOPY W/ RETROGRADES Left 02/28/2021   Procedure: CYSTOSCOPY WITH RETROGRADE PYELOGRAM;  Surgeon: Cheri Fowler, MD;  Location: [place];  Service: Gynecology;  Laterality: Left;   ESOPHAGOGASTRODUODENOSCOPY (EGD) WITH PROPOFOL  N/A 11/25/2022   Procedure: ESOPHAGOGASTRODUODENOSCOPY (EGD) WITH PROPOFOL;  Surgeon: Carol Ada, MD;  Location: [place];  Service: Gastroenterology;  Laterality: N/A;   ESOPHAGOGASTRODUODENOSCOPY (EGD) WITH PROPOFOL N/A 01/09/2023   Procedure: ESOPHAGOGASTRODUODENOSCOPY (EGD) WITH PROPOFOL;  Surgeon: Rush Landmark Telford Nab., MD;  [facility];  Service: Gastroenterology;  Laterality: N/A;   EUS  01/28/2013   Procedure: UPPER ENDOSCOPIC ULTRASOUND (EUS) LINEAR;  Surgeon: Milus Banister, MD;  [facility];  Service: Endoscopy;  Laterality: N/A;  mylan tiller [phone]   EUS Left 11/25/2022   Procedure: UPPER ENDOSCOPIC ULTRASOUND (EUS) LINEAR;  Surgeon: Carol Ada, MD;  Location: [place];  Service: Gastroenterology;  Laterality: Left;   EUS N/A 01/09/2023   Procedure: UPPER ENDOSCOPIC ULTRASOUND (EUS) LINEAR;  Surgeon: Irving Copas., MD;  [facility];  Service: Gastroenterology;  Laterality: N/A;   EXCISION OF SKIN TAG Right 03/30/2013   Procedure: EXCISION OF SKIN TAG;  Surgeon: Shann Medal, MD;  [facility];  Service: General;  Laterality: Right;   FINE NEEDLE ASPIRATION  11/25/2022   Procedure: FINE NEEDLE ASPIRATION (FNA) LINEAR;  Surgeon: Carol Ada, MD;  Location: [place];  Service: Gastroenterology;;   FINE NEEDLE ASPIRATION N/A 01/09/2023   Procedure: FINE NEEDLE ASPIRATION (FNA) LINEAR;  Surgeon: Irving Copas., MD;  [facility];  Service: Gastroenterology;  Laterality: N/A;   IR IMAGING GUIDED PORT INSERTION  02/07/2023   LIVER BIOPSY  03/30/2013   Procedure: LIVER BIOPSY;  Surgeon: Shann Medal, MD;  [facility];  Service: General;;   POLYPECTOMY     REDUCTION MAMMAPLASTY     THYROIDECTOMY, PARTIAL  1972   left   VAGINAL HYSTERECTOMY N/A 02/28/2021   Procedure: HYSTERECTOMY VAGINAL WITH UNILATERAL SALPINGECTOMY;  Surgeon: Cheri Fowler, MD;  Location: [place];   Service: Gynecology;  Laterality: N/A;    I have reviewed the social history and family history with the patient and they are unchanged from previous note.  ALLERGIES:  is allergic to lisinopril.  MEDICATIONS:  Current Outpatient Medications  Medication Sig Dispense Refill   ALPRAZolam (XANAX) 0.5 MG tablet TAKE 1 TABLET(0.5 MG) BY MOUTH TWICE DAILY AS NEEDED FOR ANXIETY (Patient taking differently: Take 0.5 mg by mouth in the morning and at bedtime.) 60 tablet 5   apixaban (ELIQUIS) 5 MG TABS tablet Take 1 tablet (5 mg total) by mouth 2 (two) times daily. 60 tablet 2   benzonatate (TESSALON PERLES) 100 MG capsule Take 1 capsule (100 mg total) by mouth 3 (three) times daily as needed for cough. (Patient taking differently: Take 100 mg by mouth as needed for cough.) 30 capsule 0   Blood Glucose Monitoring Suppl (TRUE METRIX METER) w/Device KIT USE AS DIRECTED 1 kit prn   cholecalciferol (VITAMIN D3) 25 MCG (1000 UNIT) tablet Take 1,000 Units by mouth in the morning. When able to remember     ferrous sulfate 325 (65 FE) MG tablet Take 1 tablet (325 mg total) by mouth daily. (Patient taking differently: Take 325 mg by mouth daily. When able to remember) 30 tablet 3   furosemide (LASIX) 20  MG tablet Take 1 tablet (20 mg total) by mouth daily. 30 tablet 0   glucose blood (TRUE METRIX BLOOD GLUCOSE TEST) test strip TEST BLOOD SUGAR TWICE DAILY 200 strip prn   HYDROcodone-acetaminophen (NORCO/VICODIN) 5-325 MG tablet Take 1 tablet by mouth every 6 (six) hours as needed. (Patient taking differently: Take 1 tablet by mouth 2 (two) times daily as needed for moderate pain or severe pain.) 20 tablet 0   Lancets (FREESTYLE) lancets CHECK GLUCOSE TWICE A DAY 100 each 12   metFORMIN (GLUCOPHAGE) 500 MG tablet Take 500 mg by mouth 2 (two) times daily with a meal.     morphine (MS CONTIN) 15 MG 12 hr tablet Take 1 tablet (15 mg total) by mouth every 12 (twelve) hours. 30 tablet 0   Multiple Vitamin  (MULTIVITAMIN WITH MINERALS) TABS tablet Take 1 tablet by mouth in the morning and at bedtime. When able to remember     ondansetron (ZOFRAN) 4 MG tablet Take 1 tablet (4 mg total) by mouth every 6 (six) hours as needed for nausea. (Patient taking differently: Take 4 mg by mouth as needed for nausea.) 20 tablet 0   ondansetron (ZOFRAN) 4 MG tablet Take 1 tablet (4 mg total) by mouth every 6 (six) hours as needed for nausea. 20 tablet 0   oxyCODONE (OXY IR/ROXICODONE) 5 MG immediate release tablet Take 1-2 tablets (5-10 mg total) by mouth every 4 (four) hours as needed for moderate pain, severe pain or breakthrough pain. (Patient taking differently: Take 5 mg by mouth as needed for moderate pain, severe pain or breakthrough pain.) 30 tablet 0   pantoprazole (PROTONIX) 40 MG tablet Take 1 tablet (40 mg total) by mouth daily. (Patient taking differently: Take 40 mg by mouth daily before breakfast.) 90 tablet 0   polyethylene glycol (MIRALAX / GLYCOLAX) 17 g packet Take 17 g by mouth daily. (Patient not taking: Reported on 02/07/2023) 14 each 0   senna-docusate (SENOKOT-S) 8.6-50 MG tablet Take 1 tablet by mouth at bedtime. (Patient taking differently: Take 1 tablet by mouth daily.) 30 tablet 0   No current facility-administered medications for this visit.    PHYSICAL EXAMINATION: ECOG PERFORMANCE STATUS: {CHL ONC ECOG PS:[id number]}  There were no vitals filed for this visit. Wt Readings from Last 3 Encounters:  02/06/23 188 lb 7.9 oz (85.5 kg)  01/30/23 188 lb 7.9 oz (85.5 kg)  12/27/22 185 lb (83.9 kg)    *** No vitals taken today, Exam not performed today  LABORATORY DATA:  I have reviewed the data as listed    Latest Ref Rng & Units 02/11/2023    4:07 AM 02/10/2023    3:49 AM 02/09/2023    2:52 AM  CBC  WBC 4.0 - 10.5 K/uL 7.6  8.2  7.6   Hemoglobin 12.0 - 15.0 g/dL 9.5  9.9  9.6   Hematocrit 36.0 - 46.0 % 30.0  31.0  30.3   Platelets 150 - 400 K/uL 156  191  169         Latest  Ref Rng & Units 02/11/2023    4:07 AM 02/10/2023    3:49 AM 02/09/2023    2:52 AM  CMP  Glucose 70 - 99 mg/dL 157  130  147   BUN 8 - 23 mg/dL '13  12  16   '$ Creatinine 0.44 - 1.00 mg/dL 0.75  0.82  0.77   Sodium 135 - 145 mmol/L 136  136  138   Potassium 3.5 -  5.1 mmol/L 4.1  4.0  3.5   Chloride 98 - 111 mmol/L 103  104  104   CO2 22 - 32 mmol/L '24  25  24   '$ Calcium 8.9 - 10.3 mg/dL 7.8  7.9  7.9       RADIOGRAPHIC STUDIES: I have personally reviewed the radiological images as listed and agreed with the findings in the report. No results found.    No orders of the defined types were placed in this encounter.  All questions were answered. The patient knows to call the clinic with any problems, questions or concerns. No barriers to learning was detected. The total time spent in the appointment was {CHL ONC TIME VISIT - [id number].     Baldemar Friday, CMA 02/14/2023   Felicity Coyer am acting as scribe for Truitt Merle, MD.   {Add scribe attestation statement}

## 2023-02-14 NOTE — Telephone Encounter (Signed)
Victoria [name] [phone]  Victoria [name] called with concerns about living by herself now and her being sent home with portable oxygen. She stated that she was told to call her PCP to follow up. She is now under the care of Oncology so they will address in concerns or care that she needs. She did state  that she does not feel she can stay at home alone and  that she has concerns with being at home on oxygen. I have put in a referral for [facility] to check on her and address any needs she might have. I also spoke with Victoria [name] BSW at [facility] to give her a heads up about the referral and to ask for advise.  Victoria [name] is going to call patient and see what she can do to assist and address her needs. She said she would also have nursing check on her as well and talk to her about the difference of Palliative Care and Hospice Care.

## 2023-02-14 NOTE — Progress Notes (Signed)
STAT Home Health Referral faxed to [facility] [phone]).  Pt demographics, medications, and hospital d/c summary faxed.  Fax confirmation received.

## 2023-02-14 NOTE — Progress Notes (Signed)
  Care Coordination   Note   02/14/2023 Name: Victoria [name] MRN: [id number] DOB: 1941/10/08  Victoria [name] is a 82 y.o. year old female who sees Baxley, Cresenciano Lick, MD for primary care. I reached out to Janann August by phone today to offer care coordination services.  Ms. Sconce was given information about Care Coordination services today including:   The Care Coordination services include support from the care team which includes your Nurse Coordinator, Clinical Social Worker, or Pharmacist.  The Care Coordination team is here to help remove barriers to the health concerns and goals most important to you. Care Coordination services are voluntary, and the patient may decline or stop services at any time by request to their care team member.   Care Coordination Consent Status: Patient agreed to services and verbal consent obtained.   Follow up plan:  Telephone appointment with care coordination team member scheduled for:  02/09/2023  Encounter Outcome:  Pt. Scheduled from referral   Julian Hy, [place] Direct Dial: [phone]

## 2023-02-14 NOTE — Progress Notes (Signed)
Pharmacist Chemotherapy Monitoring - Initial Assessment    Anticipated start date: 02/21/23    The following has been reviewed per standard work regarding the patient's treatment regimen: The patient's diagnosis, treatment plan and drug doses, and organ/hematologic function Lab orders and baseline tests specific to treatment regimen  The treatment plan start date, drug sequencing, and pre-medications Prior authorization status  Patient's documented medication list, including drug-drug interaction screen and prescriptions for anti-emetics and supportive care specific to the treatment regimen The drug concentrations, fluid compatibility, administration routes, and timing of the medications to be used The patient's access for treatment and lifetime cumulative dose history, if applicable  The patient's medication allergies and previous infusion related reactions, if applicable   Changes made to treatment plan:  treatment plan date  Follow up needed:  N/A   Judge Stall, [facility], 02/14/2023  3:43 PM

## 2023-02-16 DIAGNOSIS — Z7189 Other specified counseling: Secondary | ICD-10-CM | POA: Insufficient documentation

## 2023-02-16 NOTE — Assessment & Plan Note (Signed)
-  Stage IV with node and lung metastasis  -Diagnosed in January 2024 -Staging scan showed abdominal node metastasis, and probable bilateral lung metastasis. -She has been admitted multiple times to hospital for uncontrolled pain, shortness of breath with hypoxia, has been treated for pneumonia.  -She has very poor performance status, and limited social support.  She lives alone, has difficulty to perform ADLs.  She is not a candidate for chemotherapy. -I discussed palliative care and hospice.  I reviewed the benefit and logistics with her in detail.

## 2023-02-16 NOTE — Assessment & Plan Note (Signed)
-  We discussed the incurable nature of her cancer, and the overall poor prognosis, especially due to her multiple medical comorbidities, and poor performance status -The patient understands the goal of care is palliative. -I recommend DNR/DNI, she will think about it

## 2023-02-17 ENCOUNTER — Inpatient Hospital Stay: Payer: Medicare HMO | Attending: Nurse Practitioner | Admitting: Hematology

## 2023-02-17 ENCOUNTER — Other Ambulatory Visit: Payer: Self-pay

## 2023-02-17 DIAGNOSIS — Z7189 Other specified counseling: Secondary | ICD-10-CM

## 2023-02-17 DIAGNOSIS — C259 Malignant neoplasm of pancreas, unspecified: Secondary | ICD-10-CM

## 2023-02-19 ENCOUNTER — Telehealth (INDEPENDENT_AMBULATORY_CARE_PROVIDER_SITE_OTHER): Payer: Medicare HMO | Admitting: Internal Medicine

## 2023-02-19 ENCOUNTER — Other Ambulatory Visit: Payer: Self-pay

## 2023-02-19 ENCOUNTER — Encounter: Payer: Self-pay | Admitting: Hematology

## 2023-02-19 ENCOUNTER — Encounter (HOSPITAL_COMMUNITY): Payer: Self-pay

## 2023-02-19 ENCOUNTER — Encounter: Payer: Self-pay | Admitting: Internal Medicine

## 2023-02-19 ENCOUNTER — Emergency Department (HOSPITAL_COMMUNITY): Payer: Medicare HMO

## 2023-02-19 ENCOUNTER — Inpatient Hospital Stay (HOSPITAL_COMMUNITY)
Admission: EM | Admit: 2023-02-19 | Discharge: 2023-03-24 | DRG: 683 | Disposition: E | Payer: Medicare HMO | Attending: Internal Medicine | Admitting: Internal Medicine

## 2023-02-19 ENCOUNTER — Ambulatory Visit: Payer: Self-pay

## 2023-02-19 DIAGNOSIS — Z8619 Personal history of other infectious and parasitic diseases: Secondary | ICD-10-CM

## 2023-02-19 DIAGNOSIS — R54 Age-related physical debility: Secondary | ICD-10-CM | POA: Diagnosis present

## 2023-02-19 DIAGNOSIS — R739 Hyperglycemia, unspecified: Secondary | ICD-10-CM | POA: Diagnosis not present

## 2023-02-19 DIAGNOSIS — C7802 Secondary malignant neoplasm of left lung: Secondary | ICD-10-CM | POA: Diagnosis present

## 2023-02-19 DIAGNOSIS — R627 Adult failure to thrive: Secondary | ICD-10-CM | POA: Diagnosis present

## 2023-02-19 DIAGNOSIS — Z7901 Long term (current) use of anticoagulants: Secondary | ICD-10-CM

## 2023-02-19 DIAGNOSIS — Z86711 Personal history of pulmonary embolism: Secondary | ICD-10-CM | POA: Diagnosis present

## 2023-02-19 DIAGNOSIS — K5730 Diverticulosis of large intestine without perforation or abscess without bleeding: Secondary | ICD-10-CM | POA: Diagnosis not present

## 2023-02-19 DIAGNOSIS — A419 Sepsis, unspecified organism: Secondary | ICD-10-CM | POA: Diagnosis not present

## 2023-02-19 DIAGNOSIS — Z6829 Body mass index (BMI) 29.0-29.9, adult: Secondary | ICD-10-CM

## 2023-02-19 DIAGNOSIS — Z7189 Other specified counseling: Secondary | ICD-10-CM

## 2023-02-19 DIAGNOSIS — D649 Anemia, unspecified: Secondary | ICD-10-CM | POA: Diagnosis present

## 2023-02-19 DIAGNOSIS — C799 Secondary malignant neoplasm of unspecified site: Secondary | ICD-10-CM | POA: Diagnosis present

## 2023-02-19 DIAGNOSIS — I214 Non-ST elevation (NSTEMI) myocardial infarction: Secondary | ICD-10-CM | POA: Diagnosis not present

## 2023-02-19 DIAGNOSIS — N179 Acute kidney failure, unspecified: Secondary | ICD-10-CM | POA: Diagnosis not present

## 2023-02-19 DIAGNOSIS — C259 Malignant neoplasm of pancreas, unspecified: Secondary | ICD-10-CM | POA: Diagnosis present

## 2023-02-19 DIAGNOSIS — Z79899 Other long term (current) drug therapy: Secondary | ICD-10-CM | POA: Diagnosis not present

## 2023-02-19 DIAGNOSIS — E663 Overweight: Secondary | ICD-10-CM | POA: Diagnosis present

## 2023-02-19 DIAGNOSIS — Z823 Family history of stroke: Secondary | ICD-10-CM | POA: Diagnosis not present

## 2023-02-19 DIAGNOSIS — R1084 Generalized abdominal pain: Secondary | ICD-10-CM | POA: Diagnosis not present

## 2023-02-19 DIAGNOSIS — Z923 Personal history of irradiation: Secondary | ICD-10-CM

## 2023-02-19 DIAGNOSIS — R0902 Hypoxemia: Secondary | ICD-10-CM | POA: Diagnosis not present

## 2023-02-19 DIAGNOSIS — E872 Acidosis, unspecified: Secondary | ICD-10-CM | POA: Diagnosis present

## 2023-02-19 DIAGNOSIS — Z800 Family history of malignant neoplasm of digestive organs: Secondary | ICD-10-CM

## 2023-02-19 DIAGNOSIS — C7801 Secondary malignant neoplasm of right lung: Secondary | ICD-10-CM | POA: Diagnosis present

## 2023-02-19 DIAGNOSIS — Z833 Family history of diabetes mellitus: Secondary | ICD-10-CM | POA: Diagnosis not present

## 2023-02-19 DIAGNOSIS — C250 Malignant neoplasm of head of pancreas: Secondary | ICD-10-CM

## 2023-02-19 DIAGNOSIS — I2489 Other forms of acute ischemic heart disease: Secondary | ICD-10-CM | POA: Diagnosis not present

## 2023-02-19 DIAGNOSIS — Z888 Allergy status to other drugs, medicaments and biological substances status: Secondary | ICD-10-CM

## 2023-02-19 DIAGNOSIS — R0602 Shortness of breath: Secondary | ICD-10-CM | POA: Diagnosis not present

## 2023-02-19 DIAGNOSIS — I119 Hypertensive heart disease without heart failure: Secondary | ICD-10-CM | POA: Diagnosis present

## 2023-02-19 DIAGNOSIS — J90 Pleural effusion, not elsewhere classified: Secondary | ICD-10-CM | POA: Diagnosis present

## 2023-02-19 DIAGNOSIS — I959 Hypotension, unspecified: Secondary | ICD-10-CM | POA: Diagnosis present

## 2023-02-19 DIAGNOSIS — D630 Anemia in neoplastic disease: Secondary | ICD-10-CM | POA: Diagnosis present

## 2023-02-19 DIAGNOSIS — I21A1 Myocardial infarction type 2: Secondary | ICD-10-CM | POA: Diagnosis present

## 2023-02-19 DIAGNOSIS — Z66 Do not resuscitate: Secondary | ICD-10-CM | POA: Diagnosis not present

## 2023-02-19 DIAGNOSIS — E119 Type 2 diabetes mellitus without complications: Secondary | ICD-10-CM | POA: Diagnosis not present

## 2023-02-19 DIAGNOSIS — Z853 Personal history of malignant neoplasm of breast: Secondary | ICD-10-CM | POA: Diagnosis not present

## 2023-02-19 DIAGNOSIS — N170 Acute kidney failure with tubular necrosis: Principal | ICD-10-CM | POA: Diagnosis present

## 2023-02-19 DIAGNOSIS — R4182 Altered mental status, unspecified: Secondary | ICD-10-CM | POA: Diagnosis not present

## 2023-02-19 DIAGNOSIS — Z751 Person awaiting admission to adequate facility elsewhere: Secondary | ICD-10-CM

## 2023-02-19 DIAGNOSIS — E1165 Type 2 diabetes mellitus with hyperglycemia: Secondary | ICD-10-CM | POA: Diagnosis present

## 2023-02-19 DIAGNOSIS — D62 Acute posthemorrhagic anemia: Secondary | ICD-10-CM | POA: Diagnosis present

## 2023-02-19 DIAGNOSIS — R1013 Epigastric pain: Secondary | ICD-10-CM | POA: Diagnosis present

## 2023-02-19 DIAGNOSIS — K7460 Unspecified cirrhosis of liver: Secondary | ICD-10-CM | POA: Diagnosis present

## 2023-02-19 DIAGNOSIS — R7989 Other specified abnormal findings of blood chemistry: Secondary | ICD-10-CM | POA: Diagnosis not present

## 2023-02-19 DIAGNOSIS — Z515 Encounter for palliative care: Secondary | ICD-10-CM

## 2023-02-19 DIAGNOSIS — E860 Dehydration: Secondary | ICD-10-CM | POA: Diagnosis present

## 2023-02-19 DIAGNOSIS — Z7984 Long term (current) use of oral hypoglycemic drugs: Secondary | ICD-10-CM

## 2023-02-19 DIAGNOSIS — R69 Illness, unspecified: Secondary | ICD-10-CM

## 2023-02-19 DIAGNOSIS — F419 Anxiety disorder, unspecified: Secondary | ICD-10-CM | POA: Diagnosis present

## 2023-02-19 LAB — COMPREHENSIVE METABOLIC PANEL
ALT: 37 U/L (ref 0–44)
AST: 75 U/L — ABNORMAL HIGH (ref 15–41)
Albumin: 2.2 g/dL — ABNORMAL LOW (ref 3.5–5.0)
Alkaline Phosphatase: 269 U/L — ABNORMAL HIGH (ref 38–126)
Anion gap: 18 — ABNORMAL HIGH (ref 5–15)
BUN: 38 mg/dL — ABNORMAL HIGH (ref 8–23)
CO2: 19 mmol/L — ABNORMAL LOW (ref 22–32)
Calcium: 8.5 mg/dL — ABNORMAL LOW (ref 8.9–10.3)
Chloride: 100 mmol/L (ref 98–111)
Creatinine, Ser: 3.62 mg/dL — ABNORMAL HIGH (ref 0.44–1.00)
GFR, Estimated: 12 mL/min — ABNORMAL LOW (ref >60)
Glucose, Bld: 214 mg/dL — ABNORMAL HIGH (ref 70–99)
Potassium: 4.3 mmol/L (ref 3.5–5.1)
Sodium: 137 mmol/L (ref 135–145)
Total Bilirubin: 1.6 mg/dL — ABNORMAL HIGH (ref 0.3–1.2)
Total Protein: 6 g/dL — ABNORMAL LOW (ref 6.5–8.1)

## 2023-02-19 LAB — CBC WITH DIFFERENTIAL/PLATELET
Abs Immature Granulocytes: 0.06 10*3/uL (ref 0.00–0.07)
Basophils Absolute: 0 10*3/uL (ref 0.0–0.1)
Basophils Relative: 0 %
Eosinophils Absolute: 0 10*3/uL (ref 0.0–0.5)
Eosinophils Relative: 0 %
HCT: 25.2 % — ABNORMAL LOW (ref 36.0–46.0)
Hemoglobin: 8.2 g/dL — ABNORMAL LOW (ref 12.0–15.0)
Immature Granulocytes: 1 %
Lymphocytes Relative: 5 %
Lymphs Abs: 0.6 10*3/uL — ABNORMAL LOW (ref 0.7–4.0)
MCH: 28.7 pg (ref 26.0–34.0)
MCHC: 32.5 g/dL (ref 30.0–36.0)
MCV: 88.1 fL (ref 80.0–100.0)
Monocytes Absolute: 0.8 10*3/uL (ref 0.1–1.0)
Monocytes Relative: 7 %
Neutro Abs: 10.5 10*3/uL — ABNORMAL HIGH (ref 1.7–7.7)
Neutrophils Relative %: 87 %
Platelets: 310 10*3/uL (ref 150–400)
RBC: 2.86 MIL/uL — ABNORMAL LOW (ref 3.87–5.11)
RDW: 17.1 % — ABNORMAL HIGH (ref 11.5–15.5)
WBC: 11.9 10*3/uL — ABNORMAL HIGH (ref 4.0–10.5)
nRBC: 0.3 % — ABNORMAL HIGH (ref 0.0–0.2)

## 2023-02-19 LAB — PHOSPHORUS: Phosphorus: 6.6 mg/dL — ABNORMAL HIGH (ref 2.5–4.6)

## 2023-02-19 LAB — LIPASE, BLOOD: Lipase: 27 U/L (ref 11–51)

## 2023-02-19 LAB — TROPONIN I (HIGH SENSITIVITY)
Troponin I (High Sensitivity): 3173 ng/L (ref <18)
Troponin I (High Sensitivity): 2170 ng/L (ref <18)

## 2023-02-19 LAB — MAGNESIUM: Magnesium: 1.9 mg/dL (ref 1.7–2.4)

## 2023-02-19 LAB — LIPID PANEL
Cholesterol: 164 mg/dL (ref 0–200)
HDL: 23 mg/dL — ABNORMAL LOW (ref >40)
LDL Cholesterol: 110 mg/dL — ABNORMAL HIGH (ref 0–99)
Total CHOL/HDL Ratio: 7.1 RATIO
Triglycerides: 155 mg/dL — ABNORMAL HIGH (ref <150)
VLDL: 31 mg/dL (ref 0–40)

## 2023-02-19 LAB — LACTIC ACID, PLASMA
Lactic Acid, Venous: 3.4 mmol/L (ref 0.5–1.9)
Lactic Acid, Venous: 4.5 mmol/L (ref 0.5–1.9)

## 2023-02-19 LAB — CBG MONITORING, ED: Glucose-Capillary: 177 mg/dL — ABNORMAL HIGH (ref 70–99)

## 2023-02-19 MED ORDER — LACTATED RINGERS IV SOLN: INTRAVENOUS | Status: DC

## 2023-02-19 MED ORDER — HYDROMORPHONE HCL 1 MG/ML IJ SOLN
1.0000 mg | Freq: Once | INTRAMUSCULAR | Status: AC
  Administered 2023-02-19: 1 mg via INTRAVENOUS
  Filled 2023-02-19: qty 1

## 2023-02-19 MED ORDER — ASPIRIN 325 MG PO TABS
325.0000 mg | ORAL_TABLET | Freq: Once | ORAL | Status: AC
  Administered 2023-02-20: 325 mg via ORAL
  Filled 2023-02-19: qty 1

## 2023-02-19 MED ORDER — IPRATROPIUM-ALBUTEROL 0.5-2.5 (3) MG/3ML IN SOLN
3.0000 mL | Freq: Once | RESPIRATORY_TRACT | Status: AC
  Administered 2023-02-19: 3 mL via RESPIRATORY_TRACT
  Filled 2023-02-19: qty 3

## 2023-02-19 MED ORDER — LACTATED RINGERS IV BOLUS
1000.0000 mL | Freq: Once | INTRAVENOUS | Status: AC
  Administered 2023-02-19: 1000 mL via INTRAVENOUS

## 2023-02-19 MED ORDER — HEPARIN (PORCINE) 25000 UT/250ML-% IV SOLN
1000.0000 [IU]/h | INTRAVENOUS | Status: DC
  Administered 2023-02-19: 1000 [IU]/h via INTRAVENOUS
  Filled 2023-02-19: qty 250

## 2023-02-19 MED ORDER — ONDANSETRON HCL 4 MG/2ML IJ SOLN
4.0000 mg | Freq: Once | INTRAMUSCULAR | Status: AC
  Administered 2023-02-19: 4 mg via INTRAVENOUS
  Filled 2023-02-19: qty 2

## 2023-02-19 MED ORDER — HYDROMORPHONE HCL 1 MG/ML IJ SOLN
1.0000 mg | INTRAMUSCULAR | Status: DC | PRN
  Administered 2023-02-19 – 2023-02-21 (×6): 1 mg via INTRAVENOUS
  Filled 2023-02-19 (×8): qty 1

## 2023-02-19 NOTE — Assessment & Plan Note (Addendum)
Prior to comfort care, treated with sliding scale

## 2023-02-19 NOTE — Telephone Encounter (Signed)
I connected with Adrianne Jesson,granddaughter of Janann August, by telephone today.  Vincente Liberty daughter resides in [place].  She does verify that she is the granddaughter of Geneta A. Dutta.  She is on Mrs. Santacroce's DPR.    Mrs.  Ketcham has called here to this office indicating that she is struggling living with cancer alone and having some issues getting food in the home.    Adrianne says that Mrs. Beckham has 2 brothers who live here in town that have been checking on her.  She indicates she has been checking with patient by telephone but has very little information.  In fact, Adrianne did not realize patient has incurable pancreatic cancer.  At one point, a pizza was ordered for the patient by granddaughter.  Adrianne says she connected with Mrs. Marana while she was hospitalized by phone but got very little information from her.  Mrs Brandewie has an appt at [facility] tomorrow and an appt Friday for chemotherapy. Advised Adrianne of those appts. Also, Made food suggestions to [place]. Adrianne says she will be in touch with family and she will try to attend one of the appts this week. MJB, MD

## 2023-02-19 NOTE — Assessment & Plan Note (Signed)
Metastatic pancreatic cancer.

## 2023-02-19 NOTE — ED Provider Notes (Signed)
[facility] Provider Note   CSN: [id number] Arrival date & time: 01/31/2023  1603     History  Chief Complaint  Patient presents with   Abdominal Pain    Victoria [name] is a 82 y.o. female.  HPI  82 y.o. female with medical history significant for tree of DVT and PE on Eliquis, insulin-dependent type 2 diabetes, pancreatic cancer and recent hospital stay for multifocal community-acquired pneumonia patient was readmitted and discharged 7 days ago.  She had recurrent hypoxic respiratory failure but at time of discharge was improved and discharged home on oxygen.  This time, patient is very difficult to understand.  She is moaning in pain and discomfort.  She indicates she is having pain throughout all of her central lower chest and upper abdomen.  She is not giving much history as to how long or when this started since her discharge.  Reported sometimes she is taking pain medications at home but apparently not regularly.    Home Medications Prior to Admission medications   Medication Sig Start Date End Date Taking? Authorizing Provider  apixaban (ELIQUIS) 5 MG TABS tablet Take 1 tablet (5 mg total) by mouth 2 (two) times daily. 01/11/23  Yes Mansouraty, Telford Nab., MD  benzonatate (TESSALON PERLES) 100 MG capsule Take 1 capsule (100 mg total) by mouth 3 (three) times daily as needed for cough. Patient taking differently: Take 100 mg by mouth as needed for cough. 01/20/23 01/20/24 Yes Lavina Hamman, MD  metFORMIN (GLUCOPHAGE) 500 MG tablet Take 500 mg by mouth 2 (two) times daily with a meal.   Yes [provider]  Multiple Vitamin (MULTIVITAMIN WITH MINERALS) TABS tablet Take 1 tablet by mouth in the morning and at bedtime. When able to remember   Yes [provider]  oxyCODONE (OXY IR/ROXICODONE) 5 MG immediate release tablet Take 1-2 tablets (5-10 mg total) by mouth every 4 (four) hours as needed for moderate pain, severe  pain or breakthrough pain. Patient taking differently: Take 5 mg by mouth as needed for moderate pain, severe pain or breakthrough pain. 02/02/23  Yes Pickenpack-Cousar, Carlena Sax, NP  pantoprazole (PROTONIX) 40 MG tablet Take 1 tablet (40 mg total) by mouth daily. Patient taking differently: Take 40 mg by mouth daily before breakfast. 12/25/22  Yes Baxley, Cresenciano Lick, MD  ALPRAZolam (XANAX) 0.5 MG tablet TAKE 1 TABLET(0.5 MG) BY MOUTH TWICE DAILY AS NEEDED FOR ANXIETY Patient taking differently: Take 0.5 mg by mouth in the morning and at bedtime. 08/12/22   Elby Showers, MD  Blood Glucose Monitoring Suppl (TRUE METRIX METER) w/Device KIT USE AS DIRECTED 07/30/21   Elby Showers, MD  cholecalciferol (VITAMIN D3) 25 MCG (1000 UNIT) tablet Take 1,000 Units by mouth in the morning. When able to remember    [provider]  ferrous sulfate 325 (65 FE) MG tablet Take 1 tablet (325 mg total) by mouth daily. Patient taking differently: Take 325 mg by mouth daily. When able to remember 01/20/23 01/20/24  Lavina Hamman, MD  furosemide (LASIX) 20 MG tablet Take 1 tablet (20 mg total) by mouth daily. 02/04/23 02/04/24  Raiford Noble Latif, DO  glucose blood (TRUE METRIX BLOOD GLUCOSE TEST) test strip TEST BLOOD SUGAR TWICE DAILY 10/10/22   Elby Showers, MD  HYDROcodone-acetaminophen (NORCO/VICODIN) 5-325 MG tablet Take 1 tablet by mouth every 6 (six) hours as needed. Patient taking differently: Take 1 tablet by mouth 2 (two) times daily  as needed for moderate pain or severe pain. 12/27/22   Dorie Rank, MD  Lancets (FREESTYLE) lancets CHECK GLUCOSE TWICE A DAY 01/26/20   Elby Showers, MD  morphine (MS CONTIN) 15 MG 12 hr tablet Take 1 tablet (15 mg total) by mouth every 12 (twelve) hours. 02/02/23   Pickenpack-Cousar, Carlena Sax, NP  ondansetron (ZOFRAN) 4 MG tablet Take 1 tablet (4 mg total) by mouth every 6 (six) hours as needed for nausea. 02/12/23   Georgette Shell, MD  polyethylene glycol (MIRALAX /  GLYCOLAX) 17 g packet Take 17 g by mouth daily. Patient not taking: Reported on 02/07/2023 02/05/23   Raiford Noble Latif, DO  senna-docusate (SENOKOT-S) 8.6-50 MG tablet Take 1 tablet by mouth at bedtime. Patient taking differently: Take 1 tablet by mouth daily. 02/04/23   Raiford Noble Latif, DO      Allergies    Lisinopril    Review of Systems   Review of Systems  Physical Exam Updated Vital Signs BP (!) 90/58 (BP Location: Left Arm)   Pulse 90   Temp 98.5 F (36.9 C) (Oral)   Resp 17   Ht '5\' 8"'$  (1.727 m)   Wt 83.9 kg   SpO2 94%   BMI 28.13 kg/m  Physical Exam Constitutional:      Comments: Patient is awake and has alert appearance.  She is currently moaning and indicating pain in her central abdomen.  She does not have objective respiratory distress but is on supplemental oxygen.  HENT:     Mouth/Throat:     Pharynx: Oropharynx is clear.  Eyes:     Extraocular Movements: Extraocular movements intact.  Cardiovascular:     Comments:  Tachycardia.  No appreciable gross rub murmur gallop. Pulmonary:     Comments: Work of breathing not significantly elevated.  Crackles in the lung fields.  Difficult exam at this time because patient is recurrently moaning and making a lot of upper airway noise obscuring the exam. Abdominal:     Comments: Abdomen does not appear grossly distended.  It is soft but she is expressing a lot of pain with palpation throughout the abdomen.  Musculoskeletal:     Comments: Bilateral lower extremities are symmetric.  She has edema but not erythema.  About 2+ bilaterally.  Skin:    General: Skin is warm and dry.     Coloration: Skin is pale.  Neurological:     Comments: Patient is awake and when prompted is answering questions that are situationally accurate.  She is moaning a lot and seems mildly confused.     ED Results / Procedures / Treatments   Labs (all labs ordered are listed, but only abnormal results are displayed) Labs Reviewed  CBG  MONITORING, ED - Abnormal; Notable for the following components:      Result Value   Glucose-Capillary 177 (*)    All other components within normal limits  CULTURE, BLOOD (ROUTINE X 2)  CULTURE, BLOOD (ROUTINE X 2)  CBC WITH DIFFERENTIAL/PLATELET  COMPREHENSIVE METABOLIC PANEL  LIPASE, BLOOD  LACTIC ACID, PLASMA  LACTIC ACID, PLASMA  URINALYSIS, ROUTINE W REFLEX MICROSCOPIC  MAGNESIUM  PHOSPHORUS  TROPONIN I (HIGH SENSITIVITY)    EKG EKG Interpretation  Date/Time:  Wednesday February 19 2023 17:32:38 EST Ventricular Rate:  89 PR Interval:    QRS Duration: 83 QT Interval:  442 QTC Calculation: 538 R Axis:   67 Text Interpretation: NSR with Sinus arrhythmia Low voltage, precordial leads Prolonged QT interval Confirmed by  Kommor, [place] [phone]) on 02/20/2023 11:03:57 AM  Radiology CT CHEST ABDOMEN PELVIS WO CONTRAST  Result Date: 01/27/2023 CLINICAL DATA:  Sepsis. Cough. Abdominal pain. * Tracking Code: BO * EXAM: CT CHEST, ABDOMEN AND PELVIS WITHOUT CONTRAST TECHNIQUE: Multidetector CT imaging of the chest, abdomen and pelvis was performed following the standard protocol without IV contrast. RADIATION DOSE REDUCTION: This exam was performed according to the departmental dose-optimization program which includes automated exposure control, adjustment of the mA and/or kV according to patient size and/or use of iterative reconstruction technique. COMPARISON:  Chest CTA on 02/06/2023 and AP CT on 01/13/2023 FINDINGS: CT CHEST FINDINGS Cardiovascular: No acute findings. Mediastinum/Lymph Nodes: 2 cm low-attenuation left thyroid lobe nodule noted. Mild mediastinal lymphadenopathy in the left lateral aortic and and precarinal regions shows no significant change. Hilar adenopathy is difficult to exclude on this noncontrast study. Lungs/Pleura: New small right pleural effusion is seen. Diffuse interstitial prominence is seen with numerous ill-defined pulmonary nodular opacities throughout both  lungs. These findings show significant change compared to prior exam. Musculoskeletal:  No suspicious bone lesions identified. CT ABDOMEN AND PELVIS FINDINGS Hepatobiliary: Hepatic cirrhosis is again demonstrated. No masses visualized on this unenhanced exam. Prior cholecystectomy. No evidence of biliary obstruction. Pancreas: Diffuse pancreatic atrophy and ductal dilatation is again demonstrated. An ill-defined masslike opacity is seen in the area of the pancreatic head measuring approximately 4.2 x 3.8 cm, without significant change since prior study. Spleen:  Within normal limits in size. Adrenals/Urinary Tract: No evidence of urolithiasis or hydronephrosis. Unremarkable appearance of bladder. Stomach/Bowel: No evidence of obstruction, inflammatory process, or abnormal fluid collections. Severe diffuse colonic diverticulosis is again demonstrated, without evidence of diverticulitis. Normal appendix visualized. Vascular/Lymphatic: Mild peripancreatic lymphadenopathy is seen in the gastrohepatic ligament, porta hepatis, and aortocaval and left paraaortic regions, without significant no lymphadenopathy identified within the pelvis. No abdominal aortic aneurysm. Aortic atherosclerotic calcification incidentally noted. Reproductive: Prior hysterectomy noted. Adnexal regions are unremarkable in appearance. Other: Stable small bilateral inguinal hernias, which contain only fat. Small umbilical hernia containing only fat is also unchanged. Musculoskeletal:  No suspicious bone lesions identified. IMPRESSION: Stable diffuse interstitial prominence and numerous ill-defined pulmonary nodular opacities throughout both lungs. New small right pleural effusion. Differential diagnosis includes metastatic disease and lymphangitic carcinomatosis, and atypical infectious or inflammatory etiologies. Stable ill-defined masslike opacity in the area of the pancreatic head, with pancreatic atrophy and ductal dilatation, suspicious for  pancreatic carcinoma. Stable abdominal lymphadenopathy, suspicious for metastatic disease. Mild mediastinal lymphadenopathy is also stable. Hepatic cirrhosis. No evidence of hepatic neoplasm on this unenhanced exam. Severe colonic diverticulosis, without radiographic evidence of diverticulitis. 2 cm low-attenuation left thyroid lobe nodule. Recommend thyroid US. (ref: J Am Coll Radiol. 2015 Feb;12(2): 143-50). Electronically Signed   By: Marlaine Hind M.D.   On: 02/01/2023 20:16   DG Chest Port 1 View  Result Date: 01/28/2023 CLINICAL DATA:  Shortness of breath EXAM: PORTABLE CHEST 1 VIEW COMPARISON:  Chest radiograph and CTA chest 02/06/2023 FINDINGS: Is a new right chest wall port in place with the tip terminating in the mid SVC. The cardiomediastinal silhouette is stable. Extensive reticular and nodular opacities throughout both lungs have overall worsened since the radiograph from 02/06/2023. There is no pleural effusion or pneumothorax There is no acute osseous abnormality. IMPRESSION: Extensive reticular and nodular opacities throughout both lungs have worsened since the radiograph from 02/06/2023 and essentially new from the radiograph from 01/31/2023. Findings are favored to reflect worsening multifocal infection and/or pulmonary interstitial edema given change  over a relatively short interval; however, metastatic disease remains possible. Electronically Signed   By: Valetta Mole M.D.   On: 02/11/2023 17:53    Procedures Procedures   CRITICAL CARE Performed by: Charlesetta Shanks   Total critical care time: 45 minutes  Critical care time was exclusive of separately billable procedures and treating other patients.  Critical care was necessary to treat or prevent imminent or life-threatening deterioration.  Critical care was time spent personally by me on the following activities: development of treatment plan with patient and/or surrogate as well as nursing, discussions with consultants,  evaluation of patient's response to treatment, examination of patient, obtaining history from patient or surrogate, ordering and performing treatments and interventions, ordering and review of laboratory studies, ordering and review of radiographic studies, pulse oximetry and re-evaluation of patient's condition.  Medications Ordered in ED Medications  HYDROmorphone (DILAUDID) injection 1 mg (has no administration in time range)  ondansetron (ZOFRAN) injection 4 mg (has no administration in time range)  lactated ringers infusion (has no administration in time range)    ED Course/ Medical Decision Making/ A&P                             Medical Decision Making Amount and/or Complexity of Data Reviewed Labs: ordered. Radiology: ordered.  Risk Prescription drug management. Decision regarding hospitalization.  Patient presents for ill in appearance.  She has severe comorbid condition of recently diagnosed pancreatic cancer, pulmonary embolus and recent hospitalization for pneumonia versus metastatic disease.  Differential diagnosis is broad including PE\sepsis\ACS\pneumonia.  Chest x-ray reviewed at bedside by myself.  Patchy appearing infiltrates or consolidations diffusely scattered throughout both lung fields.  No large pleural effusion or pneumothorax.  I was able to get additional history from the patient's granddaughter who arrived shortly after the patient.  Patient had been doing relatively well after her discharge and more acutely started having difficulty with breathing and chest pain.  19: 45 patient's troponin is elevated 3173.  Patient actually looks improved clinically.  She has had Dilaudid and a breathing treatment.  She is alert and smiling.  Speech is clear.  Patient is going to CT scan.  I have reviewed the findings with the troponin and new kidney injury with the patient's granddaughter at bedside who is also one of her caregivers and emergency contacts.  She voices  understanding.  She reports the patient had reported to her personal doctor that she wished to have a DO NOT RESUSCITATE order in place.  Will review this with patient when she returns from CT. patient reports that she took her Eliquis yesterday but she has not taken today's dose.  At this time I start heparin for NSTEMI.  Consult: Cardiology will evaluate in the emergency department.  I did review patient's DNR status with her.  At this time she does not wish to commit to DO NOT RESUSCITATE.  She needs several days to think about it.  Patient is very improved looking.  She is alert.  She does not appear confused or in significant distress at this time.  Consult: Dr. Bridgett Larsson [facility] hospitalist for admission.        Final Clinical Impression(s) / ED Diagnoses Final diagnoses:  NSTEMI (non-ST elevated myocardial infarction) ([place])  Severe comorbid illness    Rx / DC Orders ED Discharge Orders     None         Charlesetta Shanks, MD 02/21/23 [phone]

## 2023-02-19 NOTE — H&P (Signed)
History and Physical    Victoria [name] [id number] DOB: [birth year] DOA: 01/24/2023  DOS: the patient was seen and examined on 01/29/2023  PCP: Elby Showers, MD   Patient coming from: Home  I have personally briefly reviewed patient's old medical records in [place]  CC: chest pain, SOB HPI: 82 year old African-American female history of metastatic pancreatic cancer, history of PE on Eliquis, hypertension, chronic hep C, diabetes, presents to the ER today with chest pain and shortness of breath that started around 2-3 o'clock this afternoon.  Patient initially had shortness of breath and return to the chest pain.  Patient was brought to the ER by EMS.  Patient has not had any medicines since Monday evening.  She missed all of Tuesday and Wednesdays medicines.  She was discharged from the hospital at [facility] on 02-12-2023 for pneumonia.  At that time her initial troponin was 39  Arrival temp 90.5 heart rate 90 blood pressure 90/58.  Initial troponin was 3173.  Repeat troponin 2170. Sodium 137, potassium 4.3, bicarb of 19, BUN of 38, creatinine 3.6  White count 11.9, hemoglobin 8.2, platelets of 310  Lipase of 27  Lactic acid of 3.4  Noncontrasted chest CT abdomen pelvis demonstrated multiple and numerous pulmonary nodularities throughout both lungs.  New right pleural effusion.  Continued masslike opacity at the pancreatic head.  Mediastinal and abdominal adenopathy.   Cardiology consulted due to severely elevated troponins.  [facility] hospitalist contacted for admission.     ED Course: initial troponin >3000  Review of Systems:  Review of Systems  Unable to perform ROS: Mental status change    Past Medical History:  Diagnosis Date   Abnormal finding on Pap smear    Allergy    SEASONAL   Anxiety    Breast cancer ([place]) 2000   right breast lumptectomy, radiation done   Cirrhosis ([place]) 11/2012   resolved per pt   Diabetes mellitus type 2, controlled ([place])     Hepatitis C    took tx for    Hypertension    Obesity    Thrombocytopenia ([place]) 11/2012    Past Surgical History:  Procedure Laterality Date   ANTERIOR AND POSTERIOR REPAIR N/A 02/28/2021   Procedure: ANTERIOR (CYSTOCELE) AND POSTERIOR REPAIR (RECTOCELE);  Surgeon: Cheri Fowler, MD;  Location: [facility];  Service: Gynecology;  Laterality: N/A;   BIOPSY  01/09/2023   Procedure: BIOPSY;  Surgeon: Irving Copas., MD;  Location: [facility];  Service: Gastroenterology;;   BREAST BIOPSY Left 09/09/2016   BREAST LUMPECTOMY  right   BREAST LUMPECTOMY     BREAST REDUCTION SURGERY  2005   CATARACT EXTRACTION, BILATERAL  2014   CHOLECYSTECTOMY N/A 03/30/2013   Procedure: LAPAROSCOPIC CHOLECYSTECTOMY WITH INTRAOPERATIVE CHOLANGIOGRAM;  Surgeon: Shann Medal, MD;  Location: [facility];  Service: General;  Laterality: N/A;   COLONOSCOPY  2021   CYSTOSCOPY N/A 02/28/2021   Procedure: Consuela Mimes;  Surgeon: Cheri Fowler, MD;  Location: [place];  Service: Gynecology;  Laterality: N/A;   CYSTOSCOPY W/ RETROGRADES Left 02/28/2021   Procedure: CYSTOSCOPY WITH RETROGRADE PYELOGRAM;  Surgeon: Cheri Fowler, MD;  Location: [place];  Service: Gynecology;  Laterality: Left;   ESOPHAGOGASTRODUODENOSCOPY (EGD) WITH PROPOFOL N/A 11/25/2022   Procedure: ESOPHAGOGASTRODUODENOSCOPY (EGD) WITH PROPOFOL;  Surgeon: Carol Ada, MD;  Location: [place];  Service: Gastroenterology;  Laterality: N/A;   ESOPHAGOGASTRODUODENOSCOPY (EGD) WITH PROPOFOL N/A 01/09/2023   Procedure: ESOPHAGOGASTRODUODENOSCOPY (EGD) WITH PROPOFOL;  Surgeon:  Mansouraty, Telford Nab., MD;  Location: [facility];  Service: Gastroenterology;  Laterality: N/A;   EUS  01/28/2013   Procedure: UPPER ENDOSCOPIC ULTRASOUND (EUS) LINEAR;  Surgeon: Milus Banister, MD;  Location: [facility];  Service: Endoscopy;  Laterality: N/A;  kieran howdyshell [phone]   EUS Left  11/25/2022   Procedure: UPPER ENDOSCOPIC ULTRASOUND (EUS) LINEAR;  Surgeon: Carol Ada, MD;  Location: [place];  Service: Gastroenterology;  Laterality: Left;   EUS N/A 01/09/2023   Procedure: UPPER ENDOSCOPIC ULTRASOUND (EUS) LINEAR;  Surgeon: Irving Copas., MD;  [facility];  Service: Gastroenterology;  Laterality: N/A;   EXCISION OF SKIN TAG Right 03/30/2013   Procedure: EXCISION OF SKIN TAG;  Surgeon: Shann Medal, MD;  Location: [facility];  Service: General;  Laterality: Right;   FINE NEEDLE ASPIRATION  11/25/2022   Procedure: FINE NEEDLE ASPIRATION (FNA) LINEAR;  Surgeon: Carol Ada, MD;  Location: [place];  Service: Gastroenterology;;   FINE NEEDLE ASPIRATION N/A 01/09/2023   Procedure: FINE NEEDLE ASPIRATION (FNA) LINEAR;  Surgeon: Irving Copas., MD;  Location: [facility];  Service: Gastroenterology;  Laterality: N/A;   IR IMAGING GUIDED PORT INSERTION  02/07/2023   LIVER BIOPSY  03/30/2013   Procedure: LIVER BIOPSY;  Surgeon: Shann Medal, MD;  Location: [facility];  Service: General;;   POLYPECTOMY     REDUCTION MAMMAPLASTY     THYROIDECTOMY, PARTIAL  1972   left   VAGINAL HYSTERECTOMY N/A 02/28/2021   Procedure: HYSTERECTOMY VAGINAL WITH UNILATERAL SALPINGECTOMY;  Surgeon: Cheri Fowler, MD;  Location: [place];  Service: Gynecology;  Laterality: N/A;     reports that she has never smoked. She has never used smokeless tobacco. She reports that she does not drink alcohol and does not use drugs.  Allergies  Allergen Reactions   Lisinopril Cough    Family History  Problem Relation Age of Onset   Diabetes Mother    Stroke Mother    Colon cancer Sister 48   Colon polyps Neg Hx    Esophageal cancer Neg Hx    Rectal cancer Neg Hx    Stomach cancer Neg Hx    Pancreatic cancer Neg Hx    Prostate cancer Neg Hx    Breast cancer Neg Hx     Prior to Admission medications   Medication Sig Start Date End Date Taking?  Authorizing Provider  ALPRAZolam (XANAX) 0.5 MG tablet TAKE 1 TABLET(0.5 MG) BY MOUTH TWICE DAILY AS NEEDED FOR ANXIETY Patient taking differently: Take 0.5 mg by mouth in the morning and at bedtime. 08/12/22  Yes Baxley, Cresenciano Lick, MD  apixaban (ELIQUIS) 5 MG TABS tablet Take 1 tablet (5 mg total) by mouth 2 (two) times daily. 01/11/23  Yes Mansouraty, Telford Nab., MD  benzonatate (TESSALON PERLES) 100 MG capsule Take 1 capsule (100 mg total) by mouth 3 (three) times daily as needed for cough. Patient taking differently: Take 100 mg by mouth 2 (two) times daily as needed for cough. 01/20/23 01/20/24 Yes Lavina Hamman, MD  cholecalciferol (VITAMIN D3) 25 MCG (1000 UNIT) tablet Take 1,000 Units by mouth daily.   Yes [provider]  ferrous sulfate 325 (65 FE) MG tablet Take 1 tablet (325 mg total) by mouth daily. Patient taking differently: Take 325 mg by mouth daily. When able to remember 01/20/23 01/20/24 Yes Lavina Hamman, MD  furosemide (LASIX) 20 MG tablet Take 1 tablet (20 mg total) by mouth daily. 02/04/23 02/04/24 Yes Sheikh,  Omair Latif, DO  metFORMIN (GLUCOPHAGE) 500 MG tablet Take 500 mg by mouth 2 (two) times daily with a meal.   Yes [provider]  morphine (MS CONTIN) 15 MG 12 hr tablet Take 1 tablet (15 mg total) by mouth every 12 (twelve) hours. 02/02/23  Yes Pickenpack-Cousar, Carlena Sax, NP  Multiple Vitamin (MULTIVITAMIN WITH MINERALS) TABS tablet Take 1 tablet by mouth in the morning and at bedtime. When able to remember   Yes [provider]  ondansetron (ZOFRAN) 4 MG tablet Take 1 tablet (4 mg total) by mouth every 6 (six) hours as needed for nausea. 02/12/23  Yes Georgette Shell, MD  oxyCODONE (OXY IR/ROXICODONE) 5 MG immediate release tablet Take 1-2 tablets (5-10 mg total) by mouth every 4 (four) hours as needed for moderate pain, severe pain or breakthrough pain. Patient taking differently: Take 5 mg by mouth every 6 (six) hours as needed for moderate  pain, severe pain or breakthrough pain. 02/02/23  Yes Pickenpack-Cousar, Carlena Sax, NP  pantoprazole (PROTONIX) 40 MG tablet Take 1 tablet (40 mg total) by mouth daily. Patient taking differently: Take 40 mg by mouth daily before breakfast. 12/25/22  Yes Baxley, Cresenciano Lick, MD  Blood Glucose Monitoring Suppl (TRUE METRIX METER) w/Device KIT USE AS DIRECTED 07/30/21   Elby Showers, MD  glucose blood (TRUE METRIX BLOOD GLUCOSE TEST) test strip TEST BLOOD SUGAR TWICE DAILY 10/10/22   Elby Showers, MD  HYDROcodone-acetaminophen (NORCO/VICODIN) 5-325 MG tablet Take 1 tablet by mouth every 6 (six) hours as needed. Patient taking differently: Take 1 tablet by mouth 2 (two) times daily as needed for moderate pain or severe pain. 12/27/22   Dorie Rank, MD  Lancets (FREESTYLE) lancets CHECK GLUCOSE TWICE A DAY 01/26/20   Elby Showers, MD  polyethylene glycol (MIRALAX / GLYCOLAX) 17 g packet Take 17 g by mouth daily. Patient not taking: Reported on 02/07/2023 02/05/23   Raiford Noble Latif, DO  senna-docusate (SENOKOT-S) 8.6-50 MG tablet Take 1 tablet by mouth at bedtime. Patient taking differently: Take 1 tablet by mouth daily. 02/04/23   Raiford Noble Golden, DO    Physical Exam: Vitals:   02/01/2023 1845 02/03/2023 1930 02/12/2023 2045 01/30/2023 2050  BP: (!) 89/62 (!) 93/53 (!) 93/54   Pulse: 86 88 79   Resp: 20 (!) 29 14   Temp:    98.5 F (36.9 C)  TempSrc:    Oral  SpO2: 96% 91% 97%   Weight:      Height:        Physical Exam Vitals and nursing note reviewed.  Constitutional:      Comments: Appears in pain  HENT:     Head: Normocephalic and atraumatic.     Nose: Nose normal.  Cardiovascular:     Rate and Rhythm: Normal rate and regular rhythm.  Pulmonary:     Effort: Pulmonary effort is normal. No respiratory distress.  Abdominal:     General: Bowel sounds are normal.     Tenderness: There is abdominal tenderness.  Musculoskeletal:     Right lower leg: No edema.     Left lower leg: No edema.   Skin:    General: Skin is warm and dry.     Capillary Refill: Capillary refill takes less than 2 seconds.  Neurological:     Mental Status: She is disoriented.      Labs on Admission: I have personally reviewed following labs and imaging studies  CBC: Recent Labs  Lab 01/25/2023 1707  WBC 11.9*  NEUTROABS 10.5*  HGB 8.2*  HCT 25.2*  MCV 88.1  PLT 99991111   Basic Metabolic Panel: Recent Labs  Lab 02/05/2023 1707 01/24/2023 1729  NA 137  --   K 4.3  --   CL 100  --   CO2 19*  --   GLUCOSE 214*  --   BUN 38*  --   CREATININE 3.62*  --   CALCIUM 8.5*  --   MG  --  1.9  PHOS  --  6.6*   GFR: Estimated Creatinine Clearance: 14.3 mL/min (A) (by C-G formula based on SCr of 3.62 mg/dL (H)). Liver Function Tests: Recent Labs  Lab 02/17/2023 1707  AST 75*  ALT 37  ALKPHOS 269*  BILITOT 1.6*  PROT 6.0*  ALBUMIN 2.2*   Recent Labs  Lab 02/09/2023 1729  LIPASE 27   No results for input(s): "AMMONIA" in the last 168 hours. Coagulation Profile: No results for input(s): "INR", "PROTIME" in the last 168 hours. Cardiac Enzymes: Recent Labs  Lab 01/30/2023 1729 02/18/2023 2024  TROPONINIHS 3,173* 2,170*   BNP (last 3 results) No results for input(s): "PROBNP" in the last 8760 hours. HbA1C: No results for input(s): "HGBA1C" in the last 72 hours. CBG: Recent Labs  Lab 01/23/2023 1653  GLUCAP 177*   Lipid Profile: No results for input(s): "CHOL", "HDL", "LDLCALC", "TRIG", "CHOLHDL", "LDLDIRECT" in the last 72 hours. Thyroid Function Tests: No results for input(s): "TSH", "T4TOTAL", "FREET4", "T3FREE", "THYROIDAB" in the last 72 hours. Anemia Panel: No results for input(s): "VITAMINB12", "FOLATE", "FERRITIN", "TIBC", "IRON", "RETICCTPCT" in the last 72 hours. Urine analysis:    Component Value Date/Time   COLORURINE YELLOW 01/13/2023 2139   APPEARANCEUR CLEAR 01/13/2023 2139   LABSPEC 1.016 01/13/2023 2139   PHURINE 6.0 01/13/2023 2139   GLUCOSEU NEGATIVE 01/13/2023 2139    HGBUR NEGATIVE 01/13/2023 2139   BILIRUBINUR NEGATIVE 01/13/2023 2139   BILIRUBINUR neg 11/29/2022 0957   KETONESUR NEGATIVE 01/13/2023 2139   PROTEINUR 30 (A) 01/13/2023 2139   UROBILINOGEN 0.2 11/29/2022 0957   UROBILINOGEN 2.0 (H) 11/22/2008 0930   NITRITE NEGATIVE 01/13/2023 2139   LEUKOCYTESUR NEGATIVE 01/13/2023 2139    Radiological Exams on Admission: I have personally reviewed images CT CHEST ABDOMEN PELVIS WO CONTRAST  Result Date: 01/26/2023 CLINICAL DATA:  Sepsis. Cough. Abdominal pain. * Tracking Code: BO * EXAM: CT CHEST, ABDOMEN AND PELVIS WITHOUT CONTRAST TECHNIQUE: Multidetector CT imaging of the chest, abdomen and pelvis was performed following the standard protocol without IV contrast. RADIATION DOSE REDUCTION: This exam was performed according to the departmental dose-optimization program which includes automated exposure control, adjustment of the mA and/or kV according to patient size and/or use of iterative reconstruction technique. COMPARISON:  Chest CTA on 02/06/2023 and AP CT on 01/13/2023 FINDINGS: CT CHEST FINDINGS Cardiovascular: No acute findings. Mediastinum/Lymph Nodes: 2 cm low-attenuation left thyroid lobe nodule noted. Mild mediastinal lymphadenopathy in the left lateral aortic and and precarinal regions shows no significant change. Hilar adenopathy is difficult to exclude on this noncontrast study. Lungs/Pleura: New small right pleural effusion is seen. Diffuse interstitial prominence is seen with numerous ill-defined pulmonary nodular opacities throughout both lungs. These findings show significant change compared to prior exam. Musculoskeletal:  No suspicious bone lesions identified. CT ABDOMEN AND PELVIS FINDINGS Hepatobiliary: Hepatic cirrhosis is again demonstrated. No masses visualized on this unenhanced exam. Prior cholecystectomy. No evidence of biliary obstruction. Pancreas: Diffuse pancreatic atrophy and ductal dilatation is again demonstrated. An  ill-defined masslike opacity is seen in the area of the pancreatic head measuring approximately 4.2 x 3.8 cm, without significant change since prior study. Spleen:  Within normal limits in size. Adrenals/Urinary Tract: No evidence of urolithiasis or hydronephrosis. Unremarkable appearance of bladder. Stomach/Bowel: No evidence of obstruction, inflammatory process, or abnormal fluid collections. Severe diffuse colonic diverticulosis is again demonstrated, without evidence of diverticulitis. Normal appendix visualized. Vascular/Lymphatic: Mild peripancreatic lymphadenopathy is seen in the gastrohepatic ligament, porta hepatis, and aortocaval and left paraaortic regions, without significant no lymphadenopathy identified within the pelvis. No abdominal aortic aneurysm. Aortic atherosclerotic calcification incidentally noted. Reproductive: Prior hysterectomy noted. Adnexal regions are unremarkable in appearance. Other: Stable small bilateral inguinal hernias, which contain only fat. Small umbilical hernia containing only fat is also unchanged. Musculoskeletal:  No suspicious bone lesions identified. IMPRESSION: Stable diffuse interstitial prominence and numerous ill-defined pulmonary nodular opacities throughout both lungs. New small right pleural effusion. Differential diagnosis includes metastatic disease and lymphangitic carcinomatosis, and atypical infectious or inflammatory etiologies. Stable ill-defined masslike opacity in the area of the pancreatic head, with pancreatic atrophy and ductal dilatation, suspicious for pancreatic carcinoma. Stable abdominal lymphadenopathy, suspicious for metastatic disease. Mild mediastinal lymphadenopathy is also stable. Hepatic cirrhosis. No evidence of hepatic neoplasm on this unenhanced exam. Severe colonic diverticulosis, without radiographic evidence of diverticulitis. 2 cm low-attenuation left thyroid lobe nodule. Recommend thyroid US. (ref: J Am Coll Radiol. 2015 Feb;12(2):  143-50). Electronically Signed   By: Marlaine Hind M.D.   On: 02/14/2023 20:16   DG Chest Port 1 View  Result Date: 02/04/2023 CLINICAL DATA:  Shortness of breath EXAM: PORTABLE CHEST 1 VIEW COMPARISON:  Chest radiograph and CTA chest 02/06/2023 FINDINGS: Is a new right chest wall port in place with the tip terminating in the mid SVC. The cardiomediastinal silhouette is stable. Extensive reticular and nodular opacities throughout both lungs have overall worsened since the radiograph from 02/06/2023. There is no pleural effusion or pneumothorax There is no acute osseous abnormality. IMPRESSION: Extensive reticular and nodular opacities throughout both lungs have worsened since the radiograph from 02/06/2023 and essentially new from the radiograph from 01/31/2023. Findings are favored to reflect worsening multifocal infection and/or pulmonary interstitial edema given change over a relatively short interval; however, metastatic disease remains possible. Electronically Signed   By: Valetta Mole M.D.   On: 01/23/2023 17:53    EKG: My personal interpretation of EKG shows: appears junctional rhythm    Assessment/Plan Principal Problem:   NSTEMI (non-ST elevated myocardial infarction) ([place]) Active Problems:   AKI (acute kidney injury) ([place])   Pancreatic cancer (HCC)   History of pulmonary embolism   Metastatic malignant neoplasm (HCC)   DM2 (diabetes mellitus, type 2) (HCC)    Assessment and Plan: * NSTEMI (non-ST elevated myocardial infarction) ([place]) Observation cardiac telemetry bed. IV heparin. Cardiology consulted. Family understands that pt's overall prognosis given her metastatic pancreatic cancer is grim. Pt is still a full code by her own choice. Family understands pt's grim prognosis and is more realistic in their outlook. Son is trying to talk with patient her code status and would be willing to entertain the idea of hospice when pt is ready for that decision.  AKI (acute kidney injury)  ([place]) Start IVF. Repeat BMP in AM. Hold lasix  Metastatic malignant neoplasm (HCC) Metastatic pancreatic cancer.  History of pulmonary embolism Family says that pt has not had any eliquis since Monday night(02-17-2023).  Pancreatic cancer [facility]) Metastatic pancreatic cancer. Pt has recently placed port-a-cath. Oncology note  from 02-17-2023 states that pt is not a candidate for chemotherapy. Appears pt having difficulty accepting her prognosis/diagnosis. May need oncology to see her in the hospital and review her prognosis again.  DM2 (diabetes mellitus, type 2) (HCC) Add SSI.   DVT prophylaxis: IV heparin gtts Code Status: Full Code Family Communication: discussed with pt's son Quinton and grand-dtr Adrianne Disposition Plan: return home  Consults called: EDP has consulted cardiology  Admission status: Observation,  card/tele   Kristopher Oppenheim, DO [facility] 01/31/2023, 10:39 PM

## 2023-02-19 NOTE — Assessment & Plan Note (Addendum)
Metastatic pancreatic cancer. Pt has recently placed port-a-cath. Oncology note from 02-17-2023 states that pt is not a candidate for chemotherapy. Appears pt having difficulty accepting her prognosis/diagnosis.  Oncology met with family on the evening of 2/29.  Mentation of patient had greatly worsened so she was not able to participate in discussion.  She is now comfort care, awaiting transfer to hospice facility.

## 2023-02-19 NOTE — ED Notes (Addendum)
Pt to CT

## 2023-02-19 NOTE — Assessment & Plan Note (Addendum)
Family says that pt has not had any eliquis since Monday night(02-17-2023).  Started on heparin infusion on admission, discontinued after bleed.

## 2023-02-19 NOTE — Assessment & Plan Note (Addendum)
Observation cardiac telemetry bed. IV heparin. Cardiology consulted. Family understands that pt's overall prognosis given her metastatic pancreatic cancer is grim. Pt is still a full code by her own choice. Family understands pt's grim prognosis and is more realistic in their outlook. Son is trying to talk with patient her code status and would be willing to entertain the idea of hospice when pt is ready for that decision. Check lipid. Start statin. Hold betablockers for now due to low BP. Start ASA. Check echo.

## 2023-02-19 NOTE — Assessment & Plan Note (Addendum)
Renal function normal on 2/20, but presented with creatinine of 3.62.  It is possible this could be from poor p.o. intake (albumin only 1.9) and continued use of Lasix.  Also possibly from decompensated heart failure?  Creatinine slightly worse despite holding Lasix and starting lactated ringers.  Nephrology consulted.  Underlying issue now felt to be blood loss.  Workup discontinued after being made comfort care.

## 2023-02-19 NOTE — ED Triage Notes (Signed)
Pt from home via EMS for abdominal pain. States she dropped her medications outside yesterday and attempted to call her provider for refills yesterday but was unable to fill meds. States she has not taken any medications today; last dose of any prescriptions was yesterday morning. Reports dry cough x 3-4 months; newly productive. Recent [facility] hospitalization 2/15-21/24 for pneumonia

## 2023-02-19 NOTE — Subjective & Objective (Signed)
CC: chest pain, SOB HPI: 82 year old African-American female history of metastatic pancreatic cancer, history of PE on Eliquis, hypertension, chronic hep C, diabetes, presents to the ER today with chest pain and shortness of breath that started around 2-3 o'clock this afternoon.  Patient initially had shortness of breath and return to the chest pain.  Patient was brought to the ER by EMS.  Patient has not had any medicines since Monday evening.  She missed all of Tuesday and Wednesdays medicines.  She was discharged from the hospital at [facility] on 02-12-2023 for pneumonia.  At that time her initial troponin was 39  Arrival temp 90.5 heart rate 90 blood pressure 90/58.  Initial troponin was 3173.  Repeat troponin 2170. Sodium 137, potassium 4.3, bicarb of 19, BUN of 38, creatinine 3.6  White count 11.9, hemoglobin 8.2, platelets of 310  Lipase of 27  Lactic acid of 3.4  Noncontrasted chest CT abdomen pelvis demonstrated multiple and numerous pulmonary nodularities throughout both lungs.  New right pleural effusion.  Continued masslike opacity at the pancreatic head.  Mediastinal and abdominal adenopathy.   Cardiology consulted due to severely elevated troponins.  [facility] contacted for admission.

## 2023-02-19 NOTE — ED Notes (Signed)
ED TO INPATIENT HANDOFF REPORT  ED Nurse Name and Phone #: Gregary Signs [phone]  S Name/Age/Gender Victoria [name] 82 y.o. female Room/Bed: 004C/004C  Code Status   Code Status: Full Code  Home/SNF/Other Home Patient oriented to: self Is this baseline? Yes   Triage Complete: Triage complete  Chief Complaint NSTEMI (non-ST elevated myocardial infarction) [facility]) [I21.4]  Triage Note Pt from home via EMS for abdominal pain. States she dropped her medications outside yesterday and attempted to call her provider for refills yesterday but was unable to fill meds. States she has not taken any medications today; last dose of any prescriptions was yesterday morning. Reports dry cough x 3-4 months; newly productive. Recent [facility] hospitalization 2/15-21/24 for pneumonia   Allergies Allergies  Allergen Reactions   Lisinopril Cough    Level of Care/Admitting Diagnosis ED Disposition     ED Disposition  Admit   Condition  --   Comment  Hospital Area: [place] [[id number]]  Level of Care: Telemetry Cardiac [[id number]]  May place patient in observation at [facility] or [place] if equivalent level of care is available:: No  Covid Evaluation: Asymptomatic - no recent exposure (last 10 days) testing not required  Diagnosis: NSTEMI [facility]non-ST elevated myocardial infarction) [id number]  Admitting Physician: Bridgett Larsson, [place]  Attending Physician: Bridgett Larsson, ERIC [[id number]]          B Medical/Surgery History Past Medical History:  Diagnosis Date   Abnormal finding on Pap smear    Allergy    SEASONAL   Anxiety    Breast cancer ([place]) 2000   right breast lumptectomy, radiation done   Cirrhosis ([place]) 11/2012   resolved per pt   Diabetes mellitus type 2, controlled ([place])    Hepatitis C    took tx for    Hypertension    Obesity    Thrombocytopenia ([place]) 11/2012   Past Surgical History:  Procedure Laterality Date   ANTERIOR AND POSTERIOR REPAIR N/A 02/28/2021   Procedure:  ANTERIOR (CYSTOCELE) AND POSTERIOR REPAIR (RECTOCELE);  Surgeon: Cheri Fowler, MD;  Location: [facility];  Service: Gynecology;  Laterality: N/A;   BIOPSY  01/09/2023   Procedure: BIOPSY;  Surgeon: Irving Copas., MD;  [facility]: [facility];  Service: Gastroenterology;;   BREAST BIOPSY Left 09/09/2016   BREAST LUMPECTOMY  right   BREAST LUMPECTOMY     BREAST REDUCTION SURGERY  2005   CATARACT EXTRACTION, BILATERAL  2014   CHOLECYSTECTOMY N/A 03/30/2013   Procedure: LAPAROSCOPIC CHOLECYSTECTOMY WITH INTRAOPERATIVE CHOLANGIOGRAM;  Surgeon: Shann Medal, MD;  Location: [facility];  Service: General;  Laterality: N/A;   COLONOSCOPY  2021   CYSTOSCOPY N/A 02/28/2021   Procedure: Consuela Mimes;  Surgeon: Cheri Fowler, MD;  Location: [place];  Service: Gynecology;  Laterality: N/A;   CYSTOSCOPY W/ RETROGRADES Left 02/28/2021   Procedure: CYSTOSCOPY WITH RETROGRADE PYELOGRAM;  Surgeon: Cheri Fowler, MD;  Location: [place];  Service: Gynecology;  Laterality: Left;   ESOPHAGOGASTRODUODENOSCOPY (EGD) WITH PROPOFOL N/A 11/25/2022   Procedure: ESOPHAGOGASTRODUODENOSCOPY (EGD) WITH PROPOFOL;  Surgeon: Carol Ada, MD;  Location: [place];  Service: Gastroenterology;  Laterality: N/A;   ESOPHAGOGASTRODUODENOSCOPY (EGD) WITH PROPOFOL N/A 01/09/2023   Procedure: ESOPHAGOGASTRODUODENOSCOPY (EGD) WITH PROPOFOL;  Surgeon: Rush Landmark Telford Nab., MD;  [facility];  Service: Gastroenterology;  Laterality: N/A;   EUS  01/28/2013   Procedure: UPPER ENDOSCOPIC ULTRASOUND (EUS) LINEAR;  Surgeon: Milus Banister, MD;  Location[facility];  Service: Endoscopy;  Laterality: N/A;  justyne bartlette [phone]   EUS Left 11/25/2022   Procedure: UPPER ENDOSCOPIC ULTRASOUND (EUS) LINEAR;  Surgeon: Carol Ada, MD;  Location: [place];  Service: Gastroenterology;  Laterality: Left;   EUS N/A 01/09/2023   Procedure: UPPER ENDOSCOPIC  ULTRASOUND (EUS) LINEAR;  Surgeon: Irving Copas., MD;  [facility];  Service: Gastroenterology;  Laterality: N/A;   EXCISION OF SKIN TAG Right 03/30/2013   Procedure: EXCISION OF SKIN TAG;  Surgeon: Shann Medal, MD;  [facility];  Service: General;  Laterality: Right;   FINE NEEDLE ASPIRATION  11/25/2022   Procedure: FINE NEEDLE ASPIRATION (FNA) LINEAR;  Surgeon: Carol Ada, MD;  Location: [place];  Service: Gastroenterology;;   FINE NEEDLE ASPIRATION N/A 01/09/2023   Procedure: FINE NEEDLE ASPIRATION (FNA) LINEAR;  Surgeon: Irving Copas., MD;  Location[facility];  Service: Gastroenterology;  Laterality: N/A;   IR IMAGING GUIDED PORT INSERTION  02/07/2023   LIVER BIOPSY  03/30/2013   Procedure: LIVER BIOPSY;  Surgeon: Shann Medal, MD;  [facility];  Service: General;;   POLYPECTOMY     REDUCTION MAMMAPLASTY     THYROIDECTOMY, PARTIAL  1972   left   VAGINAL HYSTERECTOMY N/A 02/28/2021   Procedure: HYSTERECTOMY VAGINAL WITH UNILATERAL SALPINGECTOMY;  Surgeon: Cheri Fowler, MD;  Location: [place];  Service: Gynecology;  Laterality: N/A;     A IV Location/Drains/Wounds Patient Lines/Drains/Airways Status     Active Line/Drains/Airways     Name Placement date Placement time Site Days   Implanted Port 02/07/23 Right Chest 02/07/23  1337  Chest  12   Peripheral IV 01/27/2023 22 G Left Antecubital 02/12/2023  2020  Antecubital  less than 1   Incision - 4 Ports Abdomen Umbilicus Right;Mid;Lateral Right;Lateral;Upper Upper;Medial 03/30/13  1356  -- 3613            Intake/Output Last 24 hours No intake or output data in the 24 hours ending 02/13/2023 2254  Labs/Imaging Results for orders placed or performed during the hospital encounter of 02/03/2023 (from the past 48 hour(s))  CBG monitoring, ED     Status: Abnormal   Collection Time: 02/16/2023  4:53 PM  Result Value Ref Range   Glucose-Capillary 177 (H) 70 - 99  mg/dL    Comment: Glucose reference range applies only to samples taken after fasting for at least 8 hours.  CBC with Differential     Status: Abnormal   Collection Time: 02/14/2023  5:07 PM  Result Value Ref Range   WBC 11.9 (H) 4.0 - 10.5 K/uL   RBC 2.86 (L) 3.87 - 5.11 MIL/uL   Hemoglobin 8.2 (L) 12.0 - 15.0 g/dL   HCT 25.2 (L) 36.0 - 46.0 %   MCV 88.1 80.0 - 100.0 fL   MCH 28.7 26.0 - 34.0 pg   MCHC 32.5 30.0 - 36.0 g/dL   RDW 17.1 (H) 11.5 - 15.5 %   Platelets 310 150 - 400 K/uL   nRBC 0.3 (H) 0.0 - 0.2 %   Neutrophils Relative % 87 %   Neutro Abs 10.5 (H) 1.7 - 7.7 K/uL   Lymphocytes Relative 5 %   Lymphs Abs 0.6 (L) 0.7 - 4.0 K/uL   Monocytes Relative 7 %   Monocytes Absolute 0.8 0.1 - 1.0 K/uL   Eosinophils Relative 0 %   Eosinophils Absolute 0.0 0.0 - 0.5 K/uL   Basophils Relative 0 %   Basophils Absolute 0.0 0.0 - 0.1 K/uL  Immature Granulocytes 1 %   Abs Immature Granulocytes 0.06 0.00 - 0.07 K/uL    Comment: Performed at [facility], [place] [address], [place], [place] [address]  Comprehensive metabolic panel     Status: Abnormal   Collection Time: 02/13/2023  5:07 PM  Result Value Ref Range   Sodium 137 135 - 145 mmol/L   Potassium 4.3 3.5 - 5.1 mmol/L   Chloride 100 98 - 111 mmol/L   CO2 19 (L) 22 - 32 mmol/L   Glucose, Bld 214 (H) 70 - 99 mg/dL    Comment: Glucose reference range applies only to samples taken after fasting for at least 8 hours.   BUN 38 (H) 8 - 23 mg/dL   Creatinine, Ser 3.62 (H) 0.44 - 1.00 mg/dL   Calcium 8.5 (L) 8.9 - 10.3 mg/dL   Total Protein 6.0 (L) 6.5 - 8.1 g/dL   Albumin 2.2 (L) 3.5 - 5.0 g/dL   AST 75 (H) 15 - 41 U/L   ALT 37 0 - 44 U/L   Alkaline Phosphatase 269 (H) 38 - 126 U/L   Total Bilirubin 1.6 (H) 0.3 - 1.2 mg/dL   GFR, Estimated 12 (L) >60 mL/min    Comment: (NOTE) Calculated using the CKD-EPI Creatinine Equation (2021)    Anion gap 18 (H) 5 - 15    Comment: Performed at [facility], [place] [address],  [place], [place] [address]  Lipase, blood     Status: None   Collection Time: 01/27/2023  5:29 PM  Result Value Ref Range   Lipase 27 11 - 51 U/L    Comment: Performed at [facility] [address] [place], [place] [address]  Troponin I (High Sensitivity)     Status: Abnormal   Collection Time: 02/05/2023  5:29 PM  Result Value Ref Range   Troponin I (High Sensitivity) 3,173 (HH) <18 ng/L    Comment: CRITICAL RESULT CALLED TO, READ BACK BY AND VERIFIED WITH Inda Merlin, RN @ [phone] 02/15/2023 BY [facility] (NOTE) Elevated high sensitivity troponin I (hsTnI) values and significant  changes across serial measurements may suggest ACS but many other  chronic and acute conditions are known to elevate hsTnI results.  Refer to the "Links" section for chest pain algorithms and additional  guidance. Performed at [facility], [place] [address], [place], [place] [address]   Lactic acid, plasma     Status: Abnormal   Collection Time: 02/14/2023  5:29 PM  Result Value Ref Range   Lactic Acid, Venous 3.4 (HH) 0.5 - 1.9 mmol/L    Comment: CRITICAL RESULT CALLED TO, READ BACK BY AND VERIFIED WITH Inda Merlin, RN @ 1935 02/16/2023 BY [facility] Performed at [facility], [place] [address] [place], [place] [address]   Magnesium     Status: None   Collection Time: 02/18/2023  5:29 PM  Result Value Ref Range   Magnesium 1.9 1.7 - 2.4 mg/dL    Comment: Performed at [facility] [address] [place], [place] [address]  Phosphorus     Status: Abnormal   Collection Time: 02/08/2023  5:29 PM  Result Value Ref Range   Phosphorus 6.6 (H) 2.5 - 4.6 mg/dL    Comment: Performed at [facility] [address] [place], [place] [address]  Lactic acid, plasma     Status: Abnormal   Collection Time: 01/24/2023  8:24 PM  Result Value Ref Range   Lactic  Acid, Venous 4.5 (HH) 0.5 - 1.9 mmol/L    Comment: CRITICAL VALUE NOTED. VALUE IS CONSISTENT WITH PREVIOUSLY REPORTED/CALLED VALUE Performed at [facility], [place] [address], [place], [place] [address]   Troponin I (High Sensitivity)     Status: Abnormal   Collection Time: 01/31/2023  8:24 PM  Result Value Ref Range   Troponin I (High Sensitivity) 2,170 (HH) <18 ng/L    Comment: CRITICAL VALUE NOTED. VALUE IS CONSISTENT WITH PREVIOUSLY REPORTED/CALLED VALUE (NOTE) Elevated high sensitivity troponin I (hsTnI) values and significant  changes across serial measurements may suggest ACS but many other  chronic and acute conditions are known to elevate hsTnI results.  Refer to the "Links" section for chest pain algorithms and additional  guidance. Performed at [facility], [place] [address] [place], [place] [address]    CT CHEST ABDOMEN PELVIS WO CONTRAST  Result Date: 01/31/2023 CLINICAL DATA:  Sepsis. Cough. Abdominal pain. * Tracking Code: BO * EXAM: CT CHEST, ABDOMEN AND PELVIS WITHOUT CONTRAST TECHNIQUE: Multidetector CT imaging of the chest, abdomen and pelvis was performed following the standard protocol without IV contrast. RADIATION DOSE REDUCTION: This exam was performed according to the departmental dose-optimization program which includes automated exposure control, adjustment of the mA and/or kV according to patient size and/or use of iterative reconstruction technique. COMPARISON:  Chest CTA on 02/06/2023 and AP CT on 01/13/2023 FINDINGS: CT CHEST FINDINGS Cardiovascular: No acute findings. Mediastinum/Lymph Nodes: 2 cm low-attenuation left thyroid lobe nodule noted. Mild mediastinal lymphadenopathy in the left lateral aortic and and precarinal regions shows no significant change. Hilar adenopathy is difficult to exclude on this noncontrast study. Lungs/Pleura: New small right pleural effusion is seen. Diffuse interstitial prominence is seen with numerous ill-defined pulmonary nodular opacities throughout both lungs. These findings show significant change compared to prior exam. Musculoskeletal:  No suspicious bone lesions  identified. CT ABDOMEN AND PELVIS FINDINGS Hepatobiliary: Hepatic cirrhosis is again demonstrated. No masses visualized on this unenhanced exam. Prior cholecystectomy. No evidence of biliary obstruction. Pancreas: Diffuse pancreatic atrophy and ductal dilatation is again demonstrated. An ill-defined masslike opacity is seen in the area of the pancreatic head measuring approximately 4.2 x 3.8 cm, without significant change since prior study. Spleen:  Within normal limits in size. Adrenals/Urinary Tract: No evidence of urolithiasis or hydronephrosis. Unremarkable appearance of bladder. Stomach/Bowel: No evidence of obstruction, inflammatory process, or abnormal fluid collections. Severe diffuse colonic diverticulosis is again demonstrated, without evidence of diverticulitis. Normal appendix visualized. Vascular/Lymphatic: Mild peripancreatic lymphadenopathy is seen in the gastrohepatic ligament, porta hepatis, and aortocaval and left paraaortic regions, without significant no lymphadenopathy identified within the pelvis. No abdominal aortic aneurysm. Aortic atherosclerotic calcification incidentally noted. Reproductive: Prior hysterectomy noted. Adnexal regions are unremarkable in appearance. Other: Stable small bilateral inguinal hernias, which contain only fat. Small umbilical hernia containing only fat is also unchanged. Musculoskeletal:  No suspicious bone lesions identified. IMPRESSION: Stable diffuse interstitial prominence and numerous ill-defined pulmonary nodular opacities throughout both lungs. New small right pleural effusion. Differential diagnosis includes metastatic disease and lymphangitic carcinomatosis, and atypical infectious or inflammatory etiologies. Stable ill-defined masslike opacity in the area of the pancreatic head, with pancreatic atrophy and ductal dilatation, suspicious for pancreatic carcinoma. Stable abdominal lymphadenopathy, suspicious for metastatic disease. Mild mediastinal  lymphadenopathy is also stable. Hepatic cirrhosis. No evidence of hepatic neoplasm on this unenhanced exam. Severe colonic diverticulosis, without radiographic evidence of diverticulitis. 2 cm low-attenuation left thyroid lobe nodule. Recommend thyroid US. (ref: J Am Coll  Radiol. 2015 Feb;12(2): 143-50). Electronically Signed   By: Marlaine Hind M.D.   On: 02/07/2023 20:16   DG Chest Port 1 View  Result Date: 02/09/2023 CLINICAL DATA:  Shortness of breath EXAM: PORTABLE CHEST 1 VIEW COMPARISON:  Chest radiograph and CTA chest 02/06/2023 FINDINGS: Is a new right chest wall port in place with the tip terminating in the mid SVC. The cardiomediastinal silhouette is stable. Extensive reticular and nodular opacities throughout both lungs have overall worsened since the radiograph from 02/06/2023. There is no pleural effusion or pneumothorax There is no acute osseous abnormality. IMPRESSION: Extensive reticular and nodular opacities throughout both lungs have worsened since the radiograph from 02/06/2023 and essentially new from the radiograph from 01/31/2023. Findings are favored to reflect worsening multifocal infection and/or pulmonary interstitial edema given change over a relatively short interval; however, metastatic disease remains possible. Electronically Signed   By: Valetta Mole M.D.   On: 02/10/2023 17:53    Pending Labs Unresulted Labs (From admission, onward)     Start     Ordered   02/20/23 0500  APTT  Daily,   R      02/02/2023 2022   02/20/23 0500  Heparin level (unfractionated)  Daily,   R     See Hyperspace for full Linked Orders Report.   02/18/2023 2022   02/20/23 0500  CBC  Daily,   R     See Hyperspace for full Linked Orders Report.   01/24/2023 2022   01/27/2023 2241  Lipid panel  Add-on,   AD        01/30/2023 2240   01/26/2023 1730  Urinalysis, Routine w reflex microscopic -Urine, Catheterized  Once,   URGENT       Question:  Specimen Source  Answer:  Urine, Catheterized   02/12/2023 1730    02/09/2023 1730  Culture, blood (routine x 2)  BLOOD CULTURE X 2,   R      02/12/2023 1730   Signed and Held  Comprehensive metabolic panel  Tomorrow morning,   R        Signed and Held   Signed and Held  CBC with Differential/Platelet  Tomorrow morning,   R        Signed and Held   Signed and Held  Magnesium  Tomorrow morning,   R        Signed and Held            Vitals/Pain Today's Vitals   02/10/2023 1910 01/26/2023 1930 02/17/2023 2045 02/01/2023 2050  BP:  (!) 93/53 (!) 93/54   Pulse:  88 79   Resp:  (!) 29 14   Temp:    98.5 F (36.9 C)  TempSrc:    Oral  SpO2:  91% 97%   Weight:      Height:      PainSc: 10-Worst pain ever       Isolation Precautions No active isolations  Medications Medications  lactated ringers infusion (0 mLs Intravenous Stopped 02/06/2023 2247)  heparin ADULT infusion 100 units/mL (25000 units/224m) (1,000 Units/hr Intravenous New Bag/Given 02/09/2023 2235)  aspirin tablet 325 mg (has no administration in time range)  HYDROmorphone (DILAUDID) injection 1 mg (has no administration in time range)  HYDROmorphone (DILAUDID) injection 1 mg (1 mg Intravenous Given 02/15/2023 1824)  ondansetron (ZOFRAN) injection 4 mg (4 mg Intravenous Given 01/31/2023 1824)  ipratropium-albuterol (DUONEB) 0.5-2.5 (3) MG/3ML nebulizer solution 3 mL (3 mLs Nebulization Given 01/23/2023 1827)  lactated ringers bolus 1,000  mL (0 mLs Intravenous Stopped 01/25/2023 2247)    Mobility non-ambulatory     Focused Assessments Cardiac Assessment Handoff:    No results found for: "CKTOTAL", "CKMB", "CKMBINDEX", "TROPONINI" No results found for: "DDIMER" Does the Patient currently have chest pain? No    R Recommendations: See Admitting Provider Note  Report given to:   Additional Notes: Pt c/o abdo pain, now on heparin gtt at 1000u/hr, rt chest port accessed, family at bedside

## 2023-02-19 NOTE — ED Notes (Signed)
ED TO INPATIENT HANDOFF REPORT  ED Nurse Name and Phone #: Remus Blake [phone]  S Name/Age/Gender Victoria [name] 82 y.o. female Room/Bed: 004C/004C  Code Status   Code Status: Full Code  Home/SNF/Other Home Patient oriented to: self, place, time, and situation Is this baseline? Yes   Triage Complete: Triage complete  Chief Complaint NSTEMI (non-ST elevated myocardial infarction) [facility]) [I21.4]  Triage Note Pt from home via EMS for abdominal pain. States she dropped her medications outside yesterday and attempted to call her provider for refills yesterday but was unable to fill meds. States she has not taken any medications today; last dose of any prescriptions was yesterday morning. Reports dry cough x 3-4 months; newly productive. Recent [facility] hospitalization 2/15-21/24 for pneumonia   Allergies Allergies  Allergen Reactions   Lisinopril Cough    Level of Care/Admitting Diagnosis ED Disposition     ED Disposition  Admit   Condition  --   Comment  Hospital Area: [place] [[id number]]  Level of Care: Telemetry Cardiac [[id number]]  May place patient in observation at [facility] or [place] if equivalent level of care is available:: No  Covid Evaluation: Asymptomatic - no recent exposure (last 10 days) testing not required  Diagnosis: [facility]ST elevated [facility] infarction) [id number]  Admitting Physician: Bridgett Larsson, [place]  Attending Physician: Bridgett Larsson, ERIC [[id number]]          B Medical/Surgery History Past Medical History:  Diagnosis Date   Abnormal finding on Pap smear    Allergy    SEASONAL   Anxiety    Breast cancer ([place]) 2000   right breast lumptectomy, radiation done   Cirrhosis ([place]) 11/2012   resolved per pt   Diabetes mellitus type 2, controlled ([place])    Hepatitis C    took tx for    Hypertension    Obesity    Thrombocytopenia ([place]) 11/2012   Past Surgical History:  Procedure Laterality Date   ANTERIOR AND POSTERIOR  REPAIR N/A 02/28/2021   Procedure: ANTERIOR (CYSTOCELE) AND POSTERIOR REPAIR (RECTOCELE);  Surgeon: Cheri Fowler, MD;  Location: [facility];  Service: Gynecology;  Laterality: N/A;   BIOPSY  01/09/2023   Procedure: BIOPSY;  Surgeon: Irving Copas., MD;  [facility];  Service: Gastroenterology;;   BREAST BIOPSY Left 09/09/2016   BREAST LUMPECTOMY  right   BREAST LUMPECTOMY     BREAST REDUCTION SURGERY  2005   CATARACT EXTRACTION, BILATERAL  2014   CHOLECYSTECTOMY N/A 03/30/2013   Procedure: LAPAROSCOPIC CHOLECYSTECTOMY WITH INTRAOPERATIVE CHOLANGIOGRAM;  Surgeon: Shann Medal, MD;  [facility];  Service: General;  Laterality: N/A;   COLONOSCOPY  2021   CYSTOSCOPY N/A 02/28/2021   Procedure: Consuela Mimes;  Surgeon: Cheri Fowler, MD;  Location: [place];  Service: Gynecology;  Laterality: N/A;   CYSTOSCOPY W/ RETROGRADES Left 02/28/2021   Procedure: CYSTOSCOPY WITH RETROGRADE PYELOGRAM;  Surgeon: Cheri Fowler, MD;  Location: [place];  Service: Gynecology;  Laterality: Left;   ESOPHAGOGASTRODUODENOSCOPY (EGD) WITH PROPOFOL N/A 11/25/2022   Procedure: ESOPHAGOGASTRODUODENOSCOPY (EGD) WITH PROPOFOL;  Surgeon: Carol Ada, MD;  Location: [place];  Service: Gastroenterology;  Laterality: N/A;   ESOPHAGOGASTRODUODENOSCOPY (EGD) WITH PROPOFOL N/A 01/09/2023   Procedure: ESOPHAGOGASTRODUODENOSCOPY (EGD) WITH PROPOFOL;  Surgeon: Rush Landmark Telford Nab., MD;  [facility];  Service: Gastroenterology;  Laterality: N/A;   EUS  01/28/2013   Procedure: UPPER ENDOSCOPIC ULTRASOUND (EUS) LINEAR;  Surgeon: Milus Banister, MD;  [facility];  Service: Endoscopy;  Laterality: N/A;  felichia bridenstine [id number]   EUS Left 11/25/2022   Procedure: UPPER ENDOSCOPIC ULTRASOUND (EUS) LINEAR;  Surgeon: Carol Ada, MD;  Location: [place];  Service: Gastroenterology;  Laterality: Left;   EUS N/A  01/09/2023   Procedure: UPPER ENDOSCOPIC ULTRASOUND (EUS) LINEAR;  Surgeon: Irving Copas., MD;  [facility];  Service: Gastroenterology;  Laterality: N/A;   EXCISION OF SKIN TAG Right 03/30/2013   Procedure: EXCISION OF SKIN TAG;  Surgeon: Shann Medal, MD;  [facility];  Service: General;  Laterality: Right;   FINE NEEDLE ASPIRATION  11/25/2022   Procedure: FINE NEEDLE ASPIRATION (FNA) LINEAR;  Surgeon: Carol Ada, MD;  Location: [place];  Service: Gastroenterology;;   FINE NEEDLE ASPIRATION N/A 01/09/2023   Procedure: FINE NEEDLE ASPIRATION (FNA) LINEAR;  Surgeon: Irving Copas., MD;  [facility];  Service: Gastroenterology;  Laterality: N/A;   IR IMAGING GUIDED PORT INSERTION  02/07/2023   LIVER BIOPSY  03/30/2013   Procedure: LIVER BIOPSY;  Surgeon: Shann Medal, MD;  [facility];  Service: General;;   POLYPECTOMY     REDUCTION MAMMAPLASTY     THYROIDECTOMY, PARTIAL  1972   left   VAGINAL HYSTERECTOMY N/A 02/28/2021   Procedure: HYSTERECTOMY VAGINAL WITH UNILATERAL SALPINGECTOMY;  Surgeon: Cheri Fowler, MD;  Location: [place];  Service: Gynecology;  Laterality: N/A;     A IV Location/Drains/Wounds Patient Lines/Drains/Airways Status     Active Line/Drains/Airways     Name Placement date Placement time Site Days   Implanted Port 02/07/23 Right Chest 02/07/23  1337  Chest  12   Peripheral IV 02/01/2023 22 G Left Antecubital 02/14/2023  2020  Antecubital  less than 1   Incision - 4 Ports Abdomen Umbilicus Right;Mid;Lateral Right;Lateral;Upper Upper;Medial 03/30/13  1356  -- 3613            Intake/Output Last 24 hours No intake or output data in the 24 hours ending 02/02/2023 2234  Labs/Imaging Results for orders placed or performed during the hospital encounter of 02/15/2023 (from the past 48 hour(s))  CBG monitoring, ED     Status: Abnormal   Collection Time: 01/24/2023  4:53 PM  Result Value Ref Range    Glucose-Capillary 177 (H) 70 - 99 mg/dL    Comment: Glucose reference range applies only to samples taken after fasting for at least 8 hours.  CBC with Differential     Status: Abnormal   Collection Time: 02/18/2023  5:07 PM  Result Value Ref Range   WBC 11.9 (H) 4.0 - 10.5 K/uL   RBC 2.86 (L) 3.87 - 5.11 MIL/uL   Hemoglobin 8.2 (L) 12.0 - 15.0 g/dL   HCT 25.2 (L) 36.0 - 46.0 %   MCV 88.1 80.0 - 100.0 fL   MCH 28.7 26.0 - 34.0 pg   MCHC 32.5 30.0 - 36.0 g/dL   RDW 17.1 (H) 11.5 - 15.5 %   Platelets 310 150 - 400 K/uL   nRBC 0.3 (H) 0.0 - 0.2 %   Neutrophils Relative % 87 %   Neutro Abs 10.5 (H) 1.7 - 7.7 K/uL   Lymphocytes Relative 5 %   Lymphs Abs 0.6 (L) 0.7 - 4.0 K/uL   Monocytes Relative 7 %   Monocytes Absolute 0.8 0.1 - 1.0 K/uL   Eosinophils Relative 0 %   Eosinophils Absolute 0.0 0.0 - 0.5 K/uL   Basophils Relative 0 %   Basophils Absolute 0.0  0.0 - 0.1 K/uL   Immature Granulocytes 1 %   Abs Immature Granulocytes 0.06 0.00 - 0.07 K/uL    Comment: Performed at [facility], [place] [employer] [address] [place], [place] [address]  Comprehensive metabolic panel     Status: Abnormal   Collection Time: 02/18/2023  5:07 PM  Result Value Ref Range   Sodium 137 135 - 145 mmol/L   Potassium 4.3 3.5 - 5.1 mmol/L   Chloride 100 98 - 111 mmol/L   CO2 19 (L) 22 - 32 mmol/L   Glucose, Bld 214 (H) 70 - 99 mg/dL    Comment: Glucose reference range applies only to samples taken after fasting for at least 8 hours.   BUN 38 (H) 8 - 23 mg/dL   Creatinine, Ser 3.62 (H) 0.44 - 1.00 mg/dL   Calcium 8.5 (L) 8.9 - 10.3 mg/dL   Total Protein 6.0 (L) 6.5 - 8.1 g/dL   Albumin 2.2 (L) 3.5 - 5.0 g/dL   AST 75 (H) 15 - 41 U/L   ALT 37 0 - 44 U/L   Alkaline Phosphatase 269 (H) 38 - 126 U/L   Total Bilirubin 1.6 (H) 0.3 - 1.2 mg/dL   GFR, Estimated 12 (L) >60 mL/min    Comment: (NOTE) Calculated using the CKD-EPI Creatinine Equation (2021)    Anion gap 18 (H) 5 - 15    Comment: Performed at [facility], [place] [address], [place], [place] [address]  Lipase, blood     Status: None   Collection Time: 02/08/2023  5:29 PM  Result Value Ref Range   Lipase 27 11 - 51 U/L    Comment: Performed at [facility] [address], [place], [place] [address]  Troponin I (High Sensitivity)     Status: Abnormal   Collection Time: 02/18/2023  5:29 PM  Result Value Ref Range   Troponin I (High Sensitivity) 3,173 (HH) <18 ng/L    Comment: CRITICAL RESULT CALLED TO, READ BACK BY AND VERIFIED WITH Inda Merlin, RN @ [phone] 02/04/2023 BY [facility] (NOTE) Elevated high sensitivity troponin I (hsTnI) values and significant  changes across serial measurements may suggest ACS but many other  chronic and acute conditions are known to elevate hsTnI results.  Refer to the "Links" section for chest pain algorithms and additional  guidance. Performed at [facility], [place] [address] [place], [place] [address]   Lactic acid, plasma     Status: Abnormal   Collection Time: 02/10/2023  5:29 PM  Result Value Ref Range   Lactic Acid, Venous 3.4 (HH) 0.5 - 1.9 mmol/L    Comment: CRITICAL RESULT CALLED TO, READ BACK BY AND VERIFIED WITH Inda Merlin, RN @ 1935 02/16/2023 BY [facility] Performed at [facility], [place] [address] [place], [place] [address]   Magnesium     Status: None   Collection Time: 02/12/2023  5:29 PM  Result Value Ref Range   Magnesium 1.9 1.7 - 2.4 mg/dL    Comment: Performed at [facility] [address], [place], [place] [address]  Phosphorus     Status: Abnormal   Collection Time: 02/15/2023  5:29 PM  Result Value Ref Range   Phosphorus 6.6 (H) 2.5 - 4.6 mg/dL    Comment: Performed at [facility] [address], [place],  [address]  Lactic acid, plasma     Status: Abnormal   Collection Time: 02/06/2023  8:24 PM  Result  Value Ref Range   Lactic Acid, Venous 4.5 (HH) 0.5 - 1.9 mmol/L    Comment: CRITICAL VALUE NOTED. VALUE IS CONSISTENT WITH PREVIOUSLY REPORTED/CALLED  VALUE Performed at [facility], [place] [address], [place], [place] [address]   Troponin I (High Sensitivity)     Status: Abnormal   Collection Time: 02/18/2023  8:24 PM  Result Value Ref Range   Troponin I (High Sensitivity) 2,170 (HH) <18 ng/L    Comment: CRITICAL VALUE NOTED. VALUE IS CONSISTENT WITH PREVIOUSLY REPORTED/CALLED VALUE (NOTE) Elevated high sensitivity troponin I (hsTnI) values and significant  changes across serial measurements may suggest ACS but many other  chronic and acute conditions are known to elevate hsTnI results.  Refer to the "Links" section for chest pain algorithms and additional  guidance. Performed at [facility], [place] [address], [place], [place] [address]    CT CHEST ABDOMEN PELVIS WO CONTRAST  Result Date: 02/08/2023 CLINICAL DATA:  Sepsis. Cough. Abdominal pain. * Tracking Code: BO * EXAM: CT CHEST, ABDOMEN AND PELVIS WITHOUT CONTRAST TECHNIQUE: Multidetector CT imaging of the chest, abdomen and pelvis was performed following the standard protocol without IV contrast. RADIATION DOSE REDUCTION: This exam was performed according to the departmental dose-optimization program which includes automated exposure control, adjustment of the mA and/or kV according to patient size and/or use of iterative reconstruction technique. COMPARISON:  Chest CTA on 02/06/2023 and AP CT on 01/13/2023 FINDINGS: CT CHEST FINDINGS Cardiovascular: No acute findings. Mediastinum/Lymph Nodes: 2 cm low-attenuation left thyroid lobe nodule noted. Mild mediastinal lymphadenopathy in the left lateral aortic and and precarinal regions shows no significant change. Hilar adenopathy is difficult to exclude on this noncontrast study. Lungs/Pleura: New small right pleural effusion is seen. Diffuse interstitial prominence is seen with numerous ill-defined pulmonary nodular opacities throughout both lungs. These findings show significant change compared to prior exam. Musculoskeletal:  No  suspicious bone lesions identified. CT ABDOMEN AND PELVIS FINDINGS Hepatobiliary: Hepatic cirrhosis is again demonstrated. No masses visualized on this unenhanced exam. Prior cholecystectomy. No evidence of biliary obstruction. Pancreas: Diffuse pancreatic atrophy and ductal dilatation is again demonstrated. An ill-defined masslike opacity is seen in the area of the pancreatic head measuring approximately 4.2 x 3.8 cm, without significant change since prior study. Spleen:  Within normal limits in size. Adrenals/Urinary Tract: No evidence of urolithiasis or hydronephrosis. Unremarkable appearance of bladder. Stomach/Bowel: No evidence of obstruction, inflammatory process, or abnormal fluid collections. Severe diffuse colonic diverticulosis is again demonstrated, without evidence of diverticulitis. Normal appendix visualized. Vascular/Lymphatic: Mild peripancreatic lymphadenopathy is seen in the gastrohepatic ligament, porta hepatis, and aortocaval and left paraaortic regions, without significant no lymphadenopathy identified within the pelvis. No abdominal aortic aneurysm. Aortic atherosclerotic calcification incidentally noted. Reproductive: Prior hysterectomy noted. Adnexal regions are unremarkable in appearance. Other: Stable small bilateral inguinal hernias, which contain only fat. Small umbilical hernia containing only fat is also unchanged. Musculoskeletal:  No suspicious bone lesions identified. IMPRESSION: Stable diffuse interstitial prominence and numerous ill-defined pulmonary nodular opacities throughout both lungs. New small right pleural effusion. Differential diagnosis includes metastatic disease and lymphangitic carcinomatosis, and atypical infectious or inflammatory etiologies. Stable ill-defined masslike opacity in the area of the pancreatic head, with pancreatic atrophy and ductal dilatation, suspicious for pancreatic carcinoma. Stable abdominal lymphadenopathy, suspicious for metastatic disease.  Mild mediastinal lymphadenopathy is also stable. Hepatic cirrhosis. No evidence of hepatic neoplasm on this unenhanced exam. Severe colonic diverticulosis, without radiographic evidence of diverticulitis. 2 cm low-attenuation left thyroid lobe nodule. Recommend  thyroid US. (ref: J Am Coll Radiol. 2015 Feb;12(2): 143-50). Electronically Signed   By: Marlaine Hind M.D.   On: 01/23/2023 20:16   DG Chest Port 1 View  Result Date: 02/14/2023 CLINICAL DATA:  Shortness of breath EXAM: PORTABLE CHEST 1 VIEW COMPARISON:  Chest radiograph and CTA chest 02/06/2023 FINDINGS: Is a new right chest wall port in place with the tip terminating in the mid SVC. The cardiomediastinal silhouette is stable. Extensive reticular and nodular opacities throughout both lungs have overall worsened since the radiograph from 02/06/2023. There is no pleural effusion or pneumothorax There is no acute osseous abnormality. IMPRESSION: Extensive reticular and nodular opacities throughout both lungs have worsened since the radiograph from 02/06/2023 and essentially new from the radiograph from 01/31/2023. Findings are favored to reflect worsening multifocal infection and/or pulmonary interstitial edema given change over a relatively short interval; however, metastatic disease remains possible. Electronically Signed   By: Valetta Mole M.D.   On: 02/10/2023 17:53    Pending Labs Unresulted Labs (From admission, onward)     Start     Ordered   02/20/23 0500  APTT  Daily,   R      02/07/2023 2022   02/20/23 0500  Heparin level (unfractionated)  Daily,   R     See Hyperspace for full Linked Orders Report.   02/16/2023 2022   02/20/23 0500  CBC  Daily,   R     See Hyperspace for full Linked Orders Report.   01/31/2023 2022   01/29/2023 1730  Urinalysis, Routine w reflex microscopic -Urine, Catheterized  Once,   URGENT       Question:  Specimen Source  Answer:  Urine, Catheterized   02/18/2023 1730   02/04/2023 1730  Culture, blood (routine x 2)   BLOOD CULTURE X 2,   R (with STAT occurrences)      02/18/2023 1730            Vitals/Pain Today's Vitals   02/11/2023 1910 02/08/2023 1930 02/13/2023 2045 01/25/2023 2050  BP:  (!) 93/53 (!) 93/54   Pulse:  88 79   Resp:  (!) 29 14   Temp:    98.5 F (36.9 C)  TempSrc:    Oral  SpO2:  91% 97%   Weight:      Height:      PainSc: 10-Worst pain ever       Isolation Precautions No active isolations  Medications Medications  lactated ringers infusion ( Intravenous New Bag/Given 02/02/2023 1827)  heparin ADULT infusion 100 units/mL (25000 units/239m) (has no administration in time range)  aspirin tablet 325 mg (has no administration in time range)  HYDROmorphone (DILAUDID) injection 1 mg (1 mg Intravenous Given 02/08/2023 1824)  ondansetron (ZOFRAN) injection 4 mg (4 mg Intravenous Given 01/28/2023 1824)  ipratropium-albuterol (DUONEB) 0.5-2.5 (3) MG/3ML nebulizer solution 3 mL (3 mLs Nebulization Given 02/03/2023 1827)  lactated ringers bolus 1,000 mL (1,000 mLs Intravenous New Bag/Given 01/23/2023 1931)    Mobility walks with person assist     Focused Assessments Cardiac Assessment Handoff:    No results found for: "CKTOTAL", "CKMB", "CKMBINDEX", "TROPONINI" No results found for: "DDIMER" Does the Patient currently have chest pain? No    R Recommendations: See Admitting Provider Note  Report given to:   Additional Notes: none

## 2023-02-19 NOTE — ED Notes (Signed)
Patient back from CT.

## 2023-02-19 NOTE — Progress Notes (Signed)
ANTICOAGULATION CONSULT NOTE - Initial Consult  Pharmacy Consult for heparin Indication: chest pain/ACS and Hx VTE  Allergies  Allergen Reactions   Lisinopril Cough    Patient Measurements: Height: '5\' 8"'$  (172.7 cm) Weight: 83.9 kg (185 lb) IBW/kg (Calculated) : 63.9 Heparin Dosing Weight: 81kg  Vital Signs: Temp: 98.5 F (36.9 C) (02/28 1619) Temp Source: Oral (02/28 1619) BP: 89/62 (02/28 1845) Pulse Rate: 86 (02/28 1845)  Labs: Recent Labs    02/14/2023 1707 02/06/2023 1729  HGB 8.2*  --   HCT 25.2*  --   PLT 310  --   CREATININE 3.62*  --   TROPONINIHS  --  3,173*    Estimated Creatinine Clearance: 14.3 mL/min (A) (by C-G formula based on SCr of 3.62 mg/dL (H)).   Medical History: Past Medical History:  Diagnosis Date   Abnormal finding on Pap smear    Allergy    SEASONAL   Anxiety    Breast cancer ([place]) 2000   right breast lumptectomy, radiation done   Cirrhosis ([place]) 11/2012   resolved per pt   Diabetes mellitus type 2, controlled ([place])    Hepatitis C    took tx for    Hypertension    Obesity    Thrombocytopenia ([place]) 11/2012    Assessment: 81 YOF presenting with abdominal pain and chest pain, hx of DVT/PE on Eliquis PTA with last dose 2/27 AM  Goal of Therapy:  Heparin level 0.3-0.7 units/ml aPTT 66-102 seconds Monitor platelets by anticoagulation protocol: Yes   Plan:  Heparin gtt at 1000 units/hr, no bolus F/u 8 hour aPTT/HL F/u ability to transition back to [place], PharmD, [place] Pharmacist ED Pharmacist Phone # [phone] 02/06/2023 8:20 PM

## 2023-02-19 NOTE — Patient Outreach (Signed)
  Care Coordination   01/28/2023 Name: Victoria [name] MRN: [id number] DOB: [birth year]   Care Coordination Outreach Attempts:  An unsuccessful telephone outreach was attempted for a scheduled appointment today.  Follow Up Plan:  Additional outreach attempts will be made to offer the patient care coordination information and services.   Encounter Outcome:  No Answer   Care Coordination Interventions:  No, not indicated    Barb Merino, RN, BSN, CCM Care Management Coordinator [facility] Care Management Direct Phone: [phone]

## 2023-02-20 ENCOUNTER — Other Ambulatory Visit: Payer: Self-pay

## 2023-02-20 ENCOUNTER — Inpatient Hospital Stay: Payer: Medicare HMO | Admitting: Nurse Practitioner

## 2023-02-20 ENCOUNTER — Inpatient Hospital Stay: Payer: Medicare HMO

## 2023-02-20 ENCOUNTER — Other Ambulatory Visit (HOSPITAL_COMMUNITY): Payer: Medicare HMO

## 2023-02-20 ENCOUNTER — Inpatient Hospital Stay: Payer: Medicare HMO | Admitting: Hematology

## 2023-02-20 DIAGNOSIS — I119 Hypertensive heart disease without heart failure: Secondary | ICD-10-CM | POA: Diagnosis present

## 2023-02-20 DIAGNOSIS — Z833 Family history of diabetes mellitus: Secondary | ICD-10-CM | POA: Diagnosis not present

## 2023-02-20 DIAGNOSIS — I214 Non-ST elevation (NSTEMI) myocardial infarction: Secondary | ICD-10-CM | POA: Diagnosis not present

## 2023-02-20 DIAGNOSIS — K7460 Unspecified cirrhosis of liver: Secondary | ICD-10-CM | POA: Diagnosis present

## 2023-02-20 DIAGNOSIS — I959 Hypotension, unspecified: Secondary | ICD-10-CM | POA: Diagnosis not present

## 2023-02-20 DIAGNOSIS — Z7901 Long term (current) use of anticoagulants: Secondary | ICD-10-CM | POA: Diagnosis not present

## 2023-02-20 DIAGNOSIS — Z86711 Personal history of pulmonary embolism: Secondary | ICD-10-CM | POA: Diagnosis not present

## 2023-02-20 DIAGNOSIS — D649 Anemia, unspecified: Secondary | ICD-10-CM

## 2023-02-20 DIAGNOSIS — E663 Overweight: Secondary | ICD-10-CM | POA: Diagnosis present

## 2023-02-20 DIAGNOSIS — I21A1 Myocardial infarction type 2: Secondary | ICD-10-CM | POA: Diagnosis present

## 2023-02-20 DIAGNOSIS — Z800 Family history of malignant neoplasm of digestive organs: Secondary | ICD-10-CM | POA: Diagnosis not present

## 2023-02-20 DIAGNOSIS — Z7189 Other specified counseling: Secondary | ICD-10-CM | POA: Diagnosis not present

## 2023-02-20 DIAGNOSIS — D630 Anemia in neoplastic disease: Secondary | ICD-10-CM | POA: Diagnosis present

## 2023-02-20 DIAGNOSIS — Z6829 Body mass index (BMI) 29.0-29.9, adult: Secondary | ICD-10-CM | POA: Diagnosis not present

## 2023-02-20 DIAGNOSIS — C259 Malignant neoplasm of pancreas, unspecified: Secondary | ICD-10-CM

## 2023-02-20 DIAGNOSIS — Z823 Family history of stroke: Secondary | ICD-10-CM | POA: Diagnosis not present

## 2023-02-20 DIAGNOSIS — D62 Acute posthemorrhagic anemia: Secondary | ICD-10-CM

## 2023-02-20 DIAGNOSIS — R7989 Other specified abnormal findings of blood chemistry: Secondary | ICD-10-CM | POA: Diagnosis not present

## 2023-02-20 DIAGNOSIS — N170 Acute kidney failure with tubular necrosis: Secondary | ICD-10-CM | POA: Diagnosis present

## 2023-02-20 DIAGNOSIS — Z515 Encounter for palliative care: Secondary | ICD-10-CM | POA: Diagnosis not present

## 2023-02-20 DIAGNOSIS — J90 Pleural effusion, not elsewhere classified: Secondary | ICD-10-CM | POA: Diagnosis present

## 2023-02-20 DIAGNOSIS — R627 Adult failure to thrive: Secondary | ICD-10-CM

## 2023-02-20 DIAGNOSIS — C7802 Secondary malignant neoplasm of left lung: Secondary | ICD-10-CM | POA: Diagnosis present

## 2023-02-20 DIAGNOSIS — E1165 Type 2 diabetes mellitus with hyperglycemia: Secondary | ICD-10-CM | POA: Diagnosis present

## 2023-02-20 DIAGNOSIS — I2489 Other forms of acute ischemic heart disease: Secondary | ICD-10-CM | POA: Diagnosis present

## 2023-02-20 DIAGNOSIS — C7801 Secondary malignant neoplasm of right lung: Secondary | ICD-10-CM | POA: Diagnosis present

## 2023-02-20 DIAGNOSIS — Z853 Personal history of malignant neoplasm of breast: Secondary | ICD-10-CM | POA: Diagnosis not present

## 2023-02-20 DIAGNOSIS — C250 Malignant neoplasm of head of pancreas: Secondary | ICD-10-CM | POA: Diagnosis not present

## 2023-02-20 DIAGNOSIS — E872 Acidosis, unspecified: Secondary | ICD-10-CM | POA: Diagnosis present

## 2023-02-20 DIAGNOSIS — E860 Dehydration: Secondary | ICD-10-CM | POA: Diagnosis present

## 2023-02-20 DIAGNOSIS — N179 Acute kidney failure, unspecified: Secondary | ICD-10-CM | POA: Diagnosis not present

## 2023-02-20 DIAGNOSIS — Z66 Do not resuscitate: Secondary | ICD-10-CM | POA: Diagnosis not present

## 2023-02-20 DIAGNOSIS — R4182 Altered mental status, unspecified: Secondary | ICD-10-CM | POA: Diagnosis not present

## 2023-02-20 DIAGNOSIS — R0602 Shortness of breath: Secondary | ICD-10-CM | POA: Diagnosis not present

## 2023-02-20 DIAGNOSIS — R1013 Epigastric pain: Secondary | ICD-10-CM | POA: Diagnosis present

## 2023-02-20 DIAGNOSIS — E119 Type 2 diabetes mellitus without complications: Secondary | ICD-10-CM | POA: Diagnosis not present

## 2023-02-20 LAB — URINALYSIS, MICROSCOPIC (REFLEX): Squamous Epithelial / HPF: >50 /HPF (ref 0–5)

## 2023-02-20 LAB — URINALYSIS, ROUTINE W REFLEX MICROSCOPIC
Glucose, UA: 100 mg/dL — AB
Ketones, ur: NEGATIVE mg/dL
Nitrite: POSITIVE — AB
Protein, ur: 30 mg/dL — AB
Specific Gravity, Urine: >1.03 — ABNORMAL HIGH (ref 1.005–1.030)
pH: 5 (ref 5.0–8.0)

## 2023-02-20 LAB — HEPARIN LEVEL (UNFRACTIONATED): Heparin Unfractionated: >1.1 IU/mL — ABNORMAL HIGH (ref 0.30–0.70)

## 2023-02-20 LAB — CBC WITH DIFFERENTIAL/PLATELET
Abs Immature Granulocytes: 0.04 10*3/uL (ref 0.00–0.07)
Basophils Absolute: 0 10*3/uL (ref 0.0–0.1)
Basophils Relative: 1 %
Eosinophils Absolute: 0.1 10*3/uL (ref 0.0–0.5)
Eosinophils Relative: 1 %
HCT: 22.6 % — ABNORMAL LOW (ref 36.0–46.0)
Hemoglobin: 7.2 g/dL — ABNORMAL LOW (ref 12.0–15.0)
Immature Granulocytes: 1 %
Lymphocytes Relative: 16 %
Lymphs Abs: 1.4 10*3/uL (ref 0.7–4.0)
MCH: 28.5 pg (ref 26.0–34.0)
MCHC: 31.9 g/dL (ref 30.0–36.0)
MCV: 89.3 fL (ref 80.0–100.0)
Monocytes Absolute: 1 10*3/uL (ref 0.1–1.0)
Monocytes Relative: 11 %
Neutro Abs: 6.2 10*3/uL (ref 1.7–7.7)
Neutrophils Relative %: 70 %
Platelets: 280 10*3/uL (ref 150–400)
RBC: 2.53 MIL/uL — ABNORMAL LOW (ref 3.87–5.11)
RDW: 17.5 % — ABNORMAL HIGH (ref 11.5–15.5)
WBC: 8.7 10*3/uL (ref 4.0–10.5)
nRBC: 0.7 % — ABNORMAL HIGH (ref 0.0–0.2)

## 2023-02-20 LAB — PREPARE RBC (CROSSMATCH)

## 2023-02-20 LAB — GLUCOSE, CAPILLARY
Glucose-Capillary: 182 mg/dL — ABNORMAL HIGH (ref 70–99)
Glucose-Capillary: 132 mg/dL — ABNORMAL HIGH (ref 70–99)
Glucose-Capillary: 147 mg/dL — ABNORMAL HIGH (ref 70–99)
Glucose-Capillary: 129 mg/dL — ABNORMAL HIGH (ref 70–99)
Glucose-Capillary: 169 mg/dL — ABNORMAL HIGH (ref 70–99)

## 2023-02-20 LAB — COMPREHENSIVE METABOLIC PANEL
ALT: 34 U/L (ref 0–44)
AST: 67 U/L — ABNORMAL HIGH (ref 15–41)
Albumin: 1.9 g/dL — ABNORMAL LOW (ref 3.5–5.0)
Alkaline Phosphatase: 242 U/L — ABNORMAL HIGH (ref 38–126)
Anion gap: 13 (ref 5–15)
BUN: 44 mg/dL — ABNORMAL HIGH (ref 8–23)
CO2: 23 mmol/L (ref 22–32)
Calcium: 7.9 mg/dL — ABNORMAL LOW (ref 8.9–10.3)
Chloride: 99 mmol/L (ref 98–111)
Creatinine, Ser: 3.67 mg/dL — ABNORMAL HIGH (ref 0.44–1.00)
GFR, Estimated: 12 mL/min — ABNORMAL LOW (ref >60)
Glucose, Bld: 151 mg/dL — ABNORMAL HIGH (ref 70–99)
Potassium: 4.1 mmol/L (ref 3.5–5.1)
Sodium: 135 mmol/L (ref 135–145)
Total Bilirubin: 1.2 mg/dL (ref 0.3–1.2)
Total Protein: 5.6 g/dL — ABNORMAL LOW (ref 6.5–8.1)

## 2023-02-20 LAB — BRAIN NATRIURETIC PEPTIDE: B Natriuretic Peptide: 684.2 pg/mL — ABNORMAL HIGH (ref 0.0–100.0)

## 2023-02-20 LAB — APTT: aPTT: 98 seconds — ABNORMAL HIGH (ref 24–36)

## 2023-02-20 LAB — TROPONIN I (HIGH SENSITIVITY): Troponin I (High Sensitivity): 2384 ng/L (ref <18)

## 2023-02-20 LAB — AMMONIA: Ammonia: 37 umol/L — ABNORMAL HIGH (ref 9–35)

## 2023-02-20 LAB — MAGNESIUM: Magnesium: 1.8 mg/dL (ref 1.7–2.4)

## 2023-02-20 LAB — LACTIC ACID, PLASMA: Lactic Acid, Venous: 2 mmol/L (ref 0.5–1.9)

## 2023-02-20 MED ORDER — SODIUM CHLORIDE 0.9 % IV BOLUS
250.0000 mL | Freq: Once | INTRAVENOUS | Status: AC
  Administered 2023-02-20: 250 mL via INTRAVENOUS

## 2023-02-20 MED ORDER — SODIUM CHLORIDE 0.9% FLUSH
10.0000 mL | Freq: Two times a day (BID) | INTRAVENOUS | Status: DC
  Administered 2023-02-20 – 2023-02-21 (×2): 10 mL

## 2023-02-20 MED ORDER — ACETAMINOPHEN 650 MG RE SUPP: 650.0000 mg | Freq: Four times a day (QID) | RECTAL | Status: DC | PRN

## 2023-02-20 MED ORDER — PANTOPRAZOLE SODIUM 40 MG PO TBEC
40.0000 mg | DELAYED_RELEASE_TABLET | Freq: Every day | ORAL | Status: DC
  Administered 2023-02-20: 40 mg via ORAL
  Filled 2023-02-20: qty 1

## 2023-02-20 MED ORDER — CHLORHEXIDINE GLUCONATE CLOTH 2 % EX PADS
6.0000 | MEDICATED_PAD | Freq: Every day | CUTANEOUS | Status: DC
  Administered 2023-02-20: 6 via TOPICAL

## 2023-02-20 MED ORDER — SODIUM CHLORIDE 0.9 % IV SOLN: INTRAVENOUS | Status: DC

## 2023-02-20 MED ORDER — SODIUM CHLORIDE 0.9% IV SOLUTION: Freq: Once | INTRAVENOUS | Status: AC

## 2023-02-20 MED ORDER — ASPIRIN 81 MG PO TBEC
81.0000 mg | DELAYED_RELEASE_TABLET | Freq: Every day | ORAL | Status: DC
  Administered 2023-02-20: 81 mg via ORAL
  Filled 2023-02-20: qty 1

## 2023-02-20 MED ORDER — NOREPINEPHRINE 4 MG/250ML-% IV SOLN
0.0000 ug/min | INTRAVENOUS | Status: DC
  Filled 2023-02-20: qty 250

## 2023-02-20 MED ORDER — MIDODRINE HCL 5 MG PO TABS
10.0000 mg | ORAL_TABLET | Freq: Three times a day (TID) | ORAL | Status: DC
  Administered 2023-02-20 (×2): 10 mg via ORAL
  Filled 2023-02-20 (×2): qty 2

## 2023-02-20 MED ORDER — SODIUM CHLORIDE 0.9 % IV BOLUS
500.0000 mL | Freq: Once | INTRAVENOUS | Status: AC
  Administered 2023-02-20: 500 mL via INTRAVENOUS

## 2023-02-20 MED ORDER — ATORVASTATIN CALCIUM 80 MG PO TABS
80.0000 mg | ORAL_TABLET | Freq: Every day | ORAL | Status: DC
  Administered 2023-02-20: 80 mg via ORAL
  Filled 2023-02-20: qty 1

## 2023-02-20 MED ORDER — INSULIN ASPART 100 UNIT/ML IJ SOLN: 0.0000 [IU] | Freq: Every day | INTRAMUSCULAR | Status: DC

## 2023-02-20 MED ORDER — ONDANSETRON HCL 4 MG/2ML IJ SOLN: 4.0000 mg | Freq: Four times a day (QID) | INTRAMUSCULAR | Status: DC | PRN

## 2023-02-20 MED ORDER — ONDANSETRON HCL 4 MG PO TABS: 4.0000 mg | ORAL_TABLET | Freq: Four times a day (QID) | ORAL | Status: DC | PRN

## 2023-02-20 MED ORDER — INSULIN ASPART 100 UNIT/ML IJ SOLN
0.0000 [IU] | Freq: Three times a day (TID) | INTRAMUSCULAR | Status: DC
  Administered 2023-02-20: 3 [IU] via SUBCUTANEOUS
  Administered 2023-02-20 (×2): 2 [IU] via SUBCUTANEOUS

## 2023-02-20 MED ORDER — PANTOPRAZOLE SODIUM 40 MG IV SOLR
40.0000 mg | Freq: Two times a day (BID) | INTRAVENOUS | Status: DC
  Administered 2023-02-20: 40 mg via INTRAVENOUS
  Filled 2023-02-20: qty 10

## 2023-02-20 MED ORDER — SODIUM CHLORIDE 0.9% FLUSH: 10.0000 mL | INTRAVENOUS | Status: DC | PRN

## 2023-02-20 MED ORDER — MORPHINE SULFATE ER 15 MG PO TBCR
15.0000 mg | EXTENDED_RELEASE_TABLET | Freq: Two times a day (BID) | ORAL | Status: DC
  Administered 2023-02-20: 15 mg via ORAL
  Filled 2023-02-20: qty 1

## 2023-02-20 MED ORDER — DIAZEPAM 5 MG/ML IJ SOLN
2.5000 mg | Freq: Four times a day (QID) | INTRAMUSCULAR | Status: DC | PRN
  Administered 2023-02-20 – 2023-02-21 (×2): 2.5 mg via INTRAVENOUS
  Filled 2023-02-20 (×2): qty 2

## 2023-02-20 MED ORDER — ACETAMINOPHEN 325 MG PO TABS: 650.0000 mg | ORAL_TABLET | Freq: Four times a day (QID) | ORAL | Status: DC | PRN

## 2023-02-20 NOTE — Assessment & Plan Note (Addendum)
Met criteria with BMI greater than 25 ?

## 2023-02-20 NOTE — Progress Notes (Signed)
Victoria [name]   DOB:May 14, 1941   [id number]   [id number]  Medical oncology follow-up note  Subjective: Patient is well-known to me, under my care for her newly diagnosed metastatic pancreatic cancer.  She has had multiple hospital admission, was recently discharged from [facility] last week after treatment for pneumonia.  She was admitted to hospital again yesterday due to failure of thrive at home.  She was found to have non-STEMI, acute renal failure, and altered mental status.  Patient is moaning in the bed, does open her eyes, but not able to answer my questions.  Her extended family, including her son, grandchildren, sisters) were in her room when I saw her.   Objective:  Vitals:   02/20/23 1715 02/20/23 1800  BP:    Pulse: 77   Resp: 15   Temp:  98.2 F (36.8 C)  SpO2: 96%     Body mass index is 29.16 kg/m.  Intake/Output Summary (Last 24 hours) at 02/20/2023 1814 Last data filed at 02/20/2023 1500 Gross per 24 hour  Intake 3042.64 ml  Output 100 ml  Net 2942.64 ml     Exam not performed   CBG (last 3)  Recent Labs    02/20/23 0758 02/20/23 1230 02/20/23 1636  GLUCAP 147* 129* 169*     Labs:  Urine Studies No results for input(s): "UHGB", "CRYS" in the last 72 hours.  Invalid input(s): "UACOL", "UAPR", "USPG", "UPH", "UTP", "UGL", "UKET", "UBIL", "UNIT", "UROB", "ULEU", "UEPI", "UWBC", "URBC", "UBAC", "CAST", "UCOM", "BILUA"  Basic Metabolic Panel: Recent Labs  Lab 02/11/2023 1707 02/11/2023 1729 02/20/23 0542  NA 137  --  135  K 4.3  --  4.1  CL 100  --  99  CO2 19*  --  23  GLUCOSE 214*  --  151*  BUN 38*  --  44*  CREATININE 3.62*  --  3.67*  CALCIUM 8.5*  --  7.9*  MG  --  1.9 1.8  PHOS  --  6.6*  --    GFR Estimated Creatinine Clearance: 14.3 mL/min (A) (by C-G formula based on SCr of 3.67 mg/dL (H)). Liver Function Tests: Recent Labs  Lab 02/18/2023 1707 02/20/23 0542  AST 75* 67*  ALT 37 34  ALKPHOS 269* 242*  BILITOT 1.6* 1.2   PROT 6.0* 5.6*  ALBUMIN 2.2* 1.9*   Recent Labs  Lab 02/03/2023 1729  LIPASE 27   Recent Labs  Lab 02/20/23 1400  AMMONIA 37*   Coagulation profile No results for input(s): "INR", "PROTIME" in the last 168 hours.  CBC: Recent Labs  Lab 01/23/2023 1707 02/20/23 0542  WBC 11.9* 8.7  NEUTROABS 10.5* 6.2  HGB 8.2* 7.2*  HCT 25.2* 22.6*  MCV 88.1 89.3  PLT 310 280   Cardiac Enzymes: No results for input(s): "CKTOTAL", "CKMB", "CKMBINDEX", "TROPONINI" in the last 168 hours. BNP: Invalid input(s): "POCBNP" CBG: Recent Labs  Lab 02/20/23 0107 02/20/23 0559 02/20/23 0758 02/20/23 1230 02/20/23 1636  GLUCAP 182* 132* 147* 129* 169*   D-Dimer No results for input(s): "DDIMER" in the last 72 hours. Hgb A1c No results for input(s): "HGBA1C" in the last 72 hours. Lipid Profile Recent Labs    01/28/2023 2024  CHOL 164  HDL 23*  LDLCALC 110*  TRIG 155*  CHOLHDL 7.1   Thyroid function studies No results for input(s): "TSH", "T4TOTAL", "T3FREE", "THYROIDAB" in the last 72 hours.  Invalid input(s): "FREET3" Anemia work up No results for input(s): "VITAMINB12", "FOLATE", "FERRITIN", "TIBC", "IRON", "RETICCTPCT" in  the last 72 hours. Microbiology Recent Results (from the past 240 hour(s))  Culture, blood (routine x 2)     Status: None (Preliminary result)   Collection Time: 02/15/2023  8:20 PM   Specimen: BLOOD  Result Value Ref Range Status   Specimen Description BLOOD LEFT ANTECUBITAL  Final   Special Requests   Final    BOTTLES DRAWN AEROBIC AND ANAEROBIC Blood Culture results may not be optimal due to an inadequate volume of blood received in culture bottles   Culture   Final    NO GROWTH < 24 HOURS Performed at [facility], [place] [address], [place], [place] [address]    Report Status PENDING  Incomplete  Culture, blood (routine x 2)     Status: None (Preliminary result)   Collection Time: 01/28/2023  8:24 PM   Specimen: BLOOD  Result Value Ref Range Status    Specimen Description BLOOD LEFT ANTECUBITAL  Final   Special Requests   Final    BOTTLES DRAWN AEROBIC AND ANAEROBIC Blood Culture results may not be optimal due to an inadequate volume of blood received in culture bottles   Culture   Final    NO GROWTH < 24 HOURS Performed at [facility], [place] [address] [place], [place] [address]    Report Status PENDING  Incomplete      Studies:  CT CHEST ABDOMEN PELVIS WO CONTRAST  Result Date: 02/03/2023 CLINICAL DATA:  Sepsis. Cough. Abdominal pain. * Tracking Code: BO * EXAM: CT CHEST, ABDOMEN AND PELVIS WITHOUT CONTRAST TECHNIQUE: Multidetector CT imaging of the chest, abdomen and pelvis was performed following the standard protocol without IV contrast. RADIATION DOSE REDUCTION: This exam was performed according to the departmental dose-optimization program which includes automated exposure control, adjustment of the mA and/or kV according to patient size and/or use of iterative reconstruction technique. COMPARISON:  Chest CTA on 02/06/2023 and AP CT on 01/13/2023 FINDINGS: CT CHEST FINDINGS Cardiovascular: No acute findings. Mediastinum/Lymph Nodes: 2 cm low-attenuation left thyroid lobe nodule noted. Mild mediastinal lymphadenopathy in the left lateral aortic and and precarinal regions shows no significant change. Hilar adenopathy is difficult to exclude on this noncontrast study. Lungs/Pleura: New small right pleural effusion is seen. Diffuse interstitial prominence is seen with numerous ill-defined pulmonary nodular opacities throughout both lungs. These findings show significant change compared to prior exam. Musculoskeletal:  No suspicious bone lesions identified. CT ABDOMEN AND PELVIS FINDINGS Hepatobiliary: Hepatic cirrhosis is again demonstrated. No masses visualized on this unenhanced exam. Prior cholecystectomy. No evidence of biliary obstruction. Pancreas: Diffuse pancreatic atrophy and ductal dilatation is again demonstrated. An  ill-defined masslike opacity is seen in the area of the pancreatic head measuring approximately 4.2 x 3.8 cm, without significant change since prior study. Spleen:  Within normal limits in size. Adrenals/Urinary Tract: No evidence of urolithiasis or hydronephrosis. Unremarkable appearance of bladder. Stomach/Bowel: No evidence of obstruction, inflammatory process, or abnormal fluid collections. Severe diffuse colonic diverticulosis is again demonstrated, without evidence of diverticulitis. Normal appendix visualized. Vascular/Lymphatic: Mild peripancreatic lymphadenopathy is seen in the gastrohepatic ligament, porta hepatis, and aortocaval and left paraaortic regions, without significant no lymphadenopathy identified within the pelvis. No abdominal aortic aneurysm. Aortic atherosclerotic calcification incidentally noted. Reproductive: Prior hysterectomy noted. Adnexal regions are unremarkable in appearance. Other: Stable small bilateral inguinal hernias, which contain only fat. Small umbilical hernia containing only fat is also unchanged. Musculoskeletal:  No suspicious bone lesions identified. IMPRESSION: Stable diffuse interstitial prominence and numerous ill-defined pulmonary nodular  opacities throughout both lungs. New small right pleural effusion. Differential diagnosis includes metastatic disease and lymphangitic carcinomatosis, and atypical infectious or inflammatory etiologies. Stable ill-defined masslike opacity in the area of the pancreatic head, with pancreatic atrophy and ductal dilatation, suspicious for pancreatic carcinoma. Stable abdominal lymphadenopathy, suspicious for metastatic disease. Mild mediastinal lymphadenopathy is also stable. Hepatic cirrhosis. No evidence of hepatic neoplasm on this unenhanced exam. Severe colonic diverticulosis, without radiographic evidence of diverticulitis. 2 cm low-attenuation left thyroid lobe nodule. Recommend thyroid US. (ref: J Am Coll Radiol. 2015 Feb;12(2):  143-50). Electronically Signed   By: Marlaine Hind M.D.   On: 01/23/2023 20:16   DG Chest Port 1 View  Result Date: 02/06/2023 CLINICAL DATA:  Shortness of breath EXAM: PORTABLE CHEST 1 VIEW COMPARISON:  Chest radiograph and CTA chest 02/06/2023 FINDINGS: Is a new right chest wall port in place with the tip terminating in the mid SVC. The cardiomediastinal silhouette is stable. Extensive reticular and nodular opacities throughout both lungs have overall worsened since the radiograph from 02/06/2023. There is no pleural effusion or pneumothorax There is no acute osseous abnormality. IMPRESSION: Extensive reticular and nodular opacities throughout both lungs have worsened since the radiograph from 02/06/2023 and essentially new from the radiograph from 01/31/2023. Findings are favored to reflect worsening multifocal infection and/or pulmonary interstitial edema given change over a relatively short interval; however, metastatic disease remains possible. Electronically Signed   By: Valetta Mole M.D.   On: 01/23/2023 17:53    Assessment: 82 y.o.  Non-STEMI, likely demand ischemia Acute renal failure Newly diagnosed pancreatic cancer with pulmonary metastasis Worsening normocytic anemia Type 2 diabetes Failure to thrive  Plan:  -Patient's overall condition has drastically deteriorated in the past week, she now has multiorgan failures, not a candidate for cancer treatment.  I recommend comfort care. -I discussed with her son in detail, including her diagnosis, poor prognosis, and short life expectancy, likely less than 2 weeks.  She is a candidate for residential hospice.  Her son agrees. -Her son also agree with DNR, I have changed her CODE STATUS -No ICU tranfer -I sent a message to primary care team Dr. Maryland Pink, to consult hospice liaison tomorrow. -I will f/u as needed    Truitt Merle, MD 02/20/2023  6:14 PM

## 2023-02-20 NOTE — Assessment & Plan Note (Addendum)
Follow-up hemoglobin on morning of 2/29 at 7.2.  Down from 9.5 nine days ago.  In addition, patient with acute kidney injury and now worsening hypotension.  Highly suspicious for acute bleed and patient on Eliquis.  Received 1 unit packed red blood cells.  Once she was made comfort care, no further interventions.

## 2023-02-20 NOTE — Consult Note (Signed)
NAME:  Victoria [name], MRN:  [id number], DOB:  01-21-1941, LOS: 0 ADMISSION DATE:  02/04/2023, CONSULTATION DATE:  02/20/2023 REFERRING MD:  Dr. Maryland Pink, CHIEF COMPLAINT:  hypotension   History of Present Illness:  Victoria [name] is a 82 yo F with a history of metastatic pancreatic cancer, Hx of pulmonary embolism, HTN, chronic Hepatitis C, cirrhosis, several hospitalizations, most recently 2/15-2/21 for PNA and anemia requiring blood transfusion, admitted to the hospital with failure to thrive, shortness of breath, chest and abdominal pain.   Of note, patient was being followed by oncology, who deemed her not a candidate for treatment secondary to poor social support and overall functional status. They document discussing palliative care and hospice on their note. Patient has been scheduled for outpatient palliative care consultation but was unable to make it given recent hospitalizations.   Her initial laboratory and imaging work up was concerning for , Cr. 3.6, Hg 8.2, platelets of 310, lactic acid of 3.4, BNP 684, and troponin elevation 3171. CT wc c/a/p multiple pulmonary nodules, new R pleural effusion, mass like opacity in pancreatic head. Mediastinal and abdominal adenopathy. Lactate improved after fluid resuscitation  Cardiology service was consulted for chest pain and concerned for NSTEMI. Per their note, elevated troponin in setting of demand ischemia 2/2 metastatic disease. Heparin was discontinued and patient was switched to home Eliquis dose. Nephrology was also consulted and did not deem patient a candidate for dialysis given cancer prognosis and unable to tolerate it.   Patient's hgb dropped to 7.2 this AM. There was concern for GI bleeding given ongoing hypotension and tachycardia and heparin drip. No overt source of bleeding; Eliquis was stopped. Patient was started on midodrine with MAP >65. No other pressor has been tried thus far.   PCCM was consulted given concern for  hypotension.   Pertinent  Medical History  Metastatic pancretic cancer Hx of PE on Eliquis HTN Chronic hepatitic C DM  Significant Hospital Events: Including procedures, antibiotic start and stop dates in addition to other pertinent events     Interim History / Subjective:  Patient is awake, oriented to self, time, place, and reason for coming to the hospital but is unable to tell us her current understanding of her cancer diagnosis. She does not recall talking with oncology that there is no treatment for her cancer. Her main concern right now is her abdominal discomfort. She explains it is her son who helps her make medical decisions.   She denies chest pain, HA, palpitations,  changes in vision, worsening shortness of breath, blood per mouth or rectum. She has an appetite.  Objective   Blood pressure (!) 83/58, pulse 81, temperature 97.6 F (36.4 C), temperature source Oral, resp. rate 14, height '5\' 8"'$  (1.727 m), weight 87 kg, SpO2 97 %.        Intake/Output Summary (Last 24 hours) at 02/20/2023 1522 Last data filed at 02/20/2023 1300 Gross per 24 hour  Intake 898.73 ml  Output 100 ml  Net 798.73 ml   Filed Weights   02/17/2023 1702 02/20/23 0129  Weight: 83.9 kg 87 kg    Examination: General: Chronically ill female lying in bed with nasal canula in place in mild distress HENT: MMM Lungs: speaking in full sentences, without increased wob, air movement anteriorly, with ronchi throughout Cardiovascular: sinus tachycardia Abdomen: BS present with tenderness to palpation to the mid epigastric area Extremities: Warm and dry, 1+ LE edema Neuro: awake, oriented to self, time, place, and reason for  coming to the hospital but is unable to tell us her current understanding of her cancer diagnosis. GU: No suprapubic tenderness, urinary catheter in place  Resolved Hospital Problem list    Assessment & Plan:  Metastatic pancreatic cancer Acute on chronic anemia Hypotension High  risk for shock Port-a-cath in place; recent oncology note she is not a candidate for chemotherapy.  Hgb drop from 9.5 on recent discharge to 7.2 this AM. Recent EGD on 01/09/23: no esophageal varices. Erythematous mucosa in the stomach. Mass noted on thein the head of the pancreas. No lesions in the duodenal bulb, first or second portion of the duodenum. Initially with worsening hypotension and tachycardia. S/p blood transfusion, IV fluids, and midodrine.  Current MAP >65. Reviewed her CT scans with high burden of metastatic disease with overall poor prognosis. Given her current stable clinical status, this patient would benefit from palliative care consultation for [place] and potential hospice conversation as she is now alert and conversant. She is not a candidate for ICU admission at this time. PCCM will continue to follow should patient's status deteriorates on current management and necessitates admission to the unit.  -Palliative care consult placed -Recommend pain management -PCCM will continue to follow  Metabolic acidosis Severe AKI Nephrology consulted; AKI from ATN c/b hypotensiopn. Given advanced cancer, not a good dialysis cancdidate as she would not tolerate it  Continue fluid resuscitation  Demand ischemia Thought to be 2/2 to severe metastatic cancer No chest pain or ischemic changes on EKG   Best Practice (right click and "Reselect all SmartList Selections" daily)   Diet/type per primary team DVT prophylaxis: SCD GI prophylaxis: recommend PPI given concern for bleeding Lines: N/A Foley:  N/A Code Status:  full code Last date of multidisciplinary goals of care discussion :TBD  Labs   CBC: Recent Labs  Lab 01/24/2023 1707 02/20/23 0542  WBC 11.9* 8.7  NEUTROABS 10.5* 6.2  HGB 8.2* 7.2*  HCT 25.2* 22.6*  MCV 88.1 89.3  PLT 310 [id number]    Basic Metabolic Panel: Recent Labs  Lab 01/24/2023 1707 02/16/2023 1729 02/20/23 0542  NA 137  --  135  K 4.3  --  4.1  CL 100  --   99  CO2 19*  --  23  GLUCOSE 214*  --  151*  BUN 38*  --  44*  CREATININE 3.62*  --  3.67*  CALCIUM 8.5*  --  7.9*  MG  --  1.9 1.8  PHOS  --  6.6*  --    GFR: Estimated Creatinine Clearance: 14.3 mL/min (A) (by C-G formula based on SCr of 3.67 mg/dL (H)). Recent Labs  Lab 01/23/2023 1707 01/23/2023 1729 02/02/2023 2024 02/20/23 0231 02/20/23 0542  WBC 11.9*  --   --   --  8.7  LATICACIDVEN  --  3.4* 4.5* 2.0*  --     Liver Function Tests: Recent Labs  Lab 01/28/2023 1707 02/20/23 0542  AST 75* 67*  ALT 37 34  ALKPHOS 269* 242*  BILITOT 1.6* 1.2  PROT 6.0* 5.6*  ALBUMIN 2.2* 1.9*   Recent Labs  Lab 02/18/2023 1729  LIPASE 27   No results for input(s): "AMMONIA" in the last 168 hours.  ABG    Component Value Date/Time   PHART 7.43 01/25/2023 0914   PCO2ART 39 01/25/2023 0914   PO2ART 67 (L) 01/25/2023 0914   HCO3 31.6 (H) 02/06/2023 1011   O2SAT 23.2 02/06/2023 1011     Coagulation Profile: No results for input(s): "  INR", "PROTIME" in the last 168 hours.  Cardiac Enzymes: No results for input(s): "CKTOTAL", "CKMB", "CKMBINDEX", "TROPONINI" in the last 168 hours.  HbA1C: Hgb A1c MFr Bld  Date/Time Value Ref Range Status  12/06/2022 09:34 AM 6.7 (H) <5.7 % of total Hgb Final    Comment:    For someone without known diabetes, a hemoglobin A1c value of 6.5% or greater indicates that they may have  diabetes and this should be confirmed with a follow-up  test. . For someone with known diabetes, a value <7% indicates  that their diabetes is well controlled and a value  greater than or equal to 7% indicates suboptimal  control. A1c targets should be individualized based on  duration of diabetes, age, comorbid conditions, and  other considerations. . Currently, no consensus exists regarding use of hemoglobin A1c for diagnosis of diabetes for children. Marland Kitchen   05/31/2022 09:55 AM 7.0 (H) <5.7 % of total Hgb Final    Comment:    For someone without known  diabetes, a hemoglobin A1c value of 6.5% or greater indicates that they may have  diabetes and this should be confirmed with a follow-up  test. . For someone with known diabetes, a value <7% indicates  that their diabetes is well controlled and a value  greater than or equal to 7% indicates suboptimal  control. A1c targets should be individualized based on  duration of diabetes, age, comorbid conditions, and  other considerations. . Currently, no consensus exists regarding use of hemoglobin A1c for diagnosis of diabetes for children. .     CBG: Recent Labs  Lab 02/17/2023 1653 02/20/23 0107 02/20/23 0559 02/20/23 0758 02/20/23 1230  GLUCAP 177* 182* 132* 147* 129*    Review of Systems:     Past Medical History:  She,  has a past medical history of Abnormal finding on Pap smear, Allergy, Anxiety, Breast cancer ([place]) (2000), Cirrhosis ([place]) (11/2012), Diabetes mellitus type 2, controlled ([place]), Hepatitis C, Hypertension, Obesity, and Thrombocytopenia ([place]) (11/2012).   Surgical History:   Past Surgical History:  Procedure Laterality Date   ANTERIOR AND POSTERIOR REPAIR N/A 02/28/2021   Procedure: ANTERIOR (CYSTOCELE) AND POSTERIOR REPAIR (RECTOCELE);  Surgeon: Cheri Fowler, MD;  [facility];  Service: Gynecology;  Laterality: N/A;   BIOPSY  01/09/2023   Procedure: BIOPSY;  Surgeon: Irving Copas., MD;  Location: [facility];  Service: Gastroenterology;;   BREAST BIOPSY Left 09/09/2016   BREAST LUMPECTOMY  right   BREAST LUMPECTOMY     BREAST REDUCTION SURGERY  2005   CATARACT EXTRACTION, BILATERAL  2014   CHOLECYSTECTOMY N/A 03/30/2013   Procedure: LAPAROSCOPIC CHOLECYSTECTOMY WITH INTRAOPERATIVE CHOLANGIOGRAM;  Surgeon: Shann Medal, MD;  Location: [facility];  Service: General;  Laterality: N/A;   COLONOSCOPY  2021   CYSTOSCOPY N/A 02/28/2021   Procedure: Consuela Mimes;  Surgeon: Cheri Fowler, MD;  Location: [place];  Service: Gynecology;  Laterality: N/A;   CYSTOSCOPY W/ RETROGRADES Left 02/28/2021   Procedure: CYSTOSCOPY WITH RETROGRADE PYELOGRAM;  Surgeon: Cheri Fowler, MD;  Location: [place];  Service: Gynecology;  Laterality: Left;   ESOPHAGOGASTRODUODENOSCOPY (EGD) WITH PROPOFOL N/A 11/25/2022   Procedure: ESOPHAGOGASTRODUODENOSCOPY (EGD) WITH PROPOFOL;  Surgeon: Carol Ada, MD;  Location: [place];  Service: Gastroenterology;  Laterality: N/A;   ESOPHAGOGASTRODUODENOSCOPY (EGD) WITH PROPOFOL N/A 01/09/2023   Procedure: ESOPHAGOGASTRODUODENOSCOPY (EGD) WITH PROPOFOL;  Surgeon: Rush Landmark Telford Nab., MD;  [facility]: [facility];  Service: Gastroenterology;  Laterality: N/A;   EUS  01/28/2013   Procedure: UPPER ENDOSCOPIC ULTRASOUND (EUS) LINEAR;  Surgeon: Milus Banister, MD;  Location: [facility];  Service: Endoscopy;  Laterality: N/A;  delores benegas [phone]   EUS Left 11/25/2022   Procedure: UPPER ENDOSCOPIC ULTRASOUND (EUS) LINEAR;  Surgeon: Carol Ada, MD;  Location: [place];  Service: Gastroenterology;  Laterality: Left;   EUS N/A 01/09/2023   Procedure: UPPER ENDOSCOPIC ULTRASOUND (EUS) LINEAR;  Surgeon: Irving Copas., MD;  [facility];  Service: Gastroenterology;  Laterality: N/A;   EXCISION OF SKIN TAG Right 03/30/2013   Procedure: EXCISION OF SKIN TAG;  Surgeon: Shann Medal, MD;  Location: [facility];  Service: General;  Laterality: Right;   FINE NEEDLE ASPIRATION  11/25/2022   Procedure: FINE NEEDLE ASPIRATION (FNA) LINEAR;  Surgeon: Carol Ada, MD;  Location: [place];  Service: Gastroenterology;;   FINE NEEDLE ASPIRATION N/A 01/09/2023   Procedure: FINE NEEDLE ASPIRATION (FNA) LINEAR;  Surgeon: Irving Copas., MD;  Location: [facility];  Service: Gastroenterology;  Laterality: N/A;   IR IMAGING GUIDED PORT INSERTION  02/07/2023   LIVER BIOPSY  03/30/2013   Procedure: LIVER BIOPSY;  Surgeon: Shann Medal, MD;  Location: [facility];  Service: General;;   POLYPECTOMY     REDUCTION MAMMAPLASTY     THYROIDECTOMY, PARTIAL  1972   left   VAGINAL HYSTERECTOMY N/A 02/28/2021   Procedure: HYSTERECTOMY VAGINAL WITH UNILATERAL SALPINGECTOMY;  Surgeon: Cheri Fowler, MD;  Location: [place];  Service: Gynecology;  Laterality: N/A;     Social History:   reports that she has never smoked. She has never used smokeless tobacco. She reports that she does not drink alcohol and does not use drugs.   Family History:  Her family history includes Colon cancer (age of onset: 72) in her sister; Diabetes in her mother; Stroke in her mother. There is no history of Colon polyps, Esophageal cancer, Rectal cancer, Stomach cancer, Pancreatic cancer, Prostate cancer, or Breast cancer.   Allergies Allergies  Allergen Reactions   Lisinopril Cough     Home Medications  Prior to Admission medications   Medication Sig Start Date End Date Taking? Authorizing Provider  ALPRAZolam (XANAX) 0.5 MG tablet TAKE 1 TABLET(0.5 MG) BY MOUTH TWICE DAILY AS NEEDED FOR ANXIETY Patient taking differently: Take 0.5 mg by mouth in the morning and at bedtime. 08/12/22  Yes Baxley, Cresenciano Lick, MD  apixaban (ELIQUIS) 5 MG TABS tablet Take 1 tablet (5 mg total) by mouth 2 (two) times daily. 01/11/23  Yes Mansouraty, Telford Nab., MD  benzonatate (TESSALON PERLES) 100 MG capsule Take 1 capsule (100 mg total) by mouth 3 (three) times daily as needed for cough. Patient taking differently: Take 100 mg by mouth 2 (two) times daily as needed for cough. 01/20/23 01/20/24 Yes Lavina Hamman, MD  cholecalciferol (VITAMIN D3) 25 MCG (1000 UNIT) tablet Take 1,000 Units by mouth daily.   Yes [provider]  ferrous sulfate 325 (65 FE) MG tablet Take 1 tablet (325 mg total) by mouth daily. Patient taking differently: Take 325 mg by mouth daily. When able to remember 01/20/23 01/20/24 Yes Lavina Hamman, MD  furosemide (LASIX) 20  MG tablet Take 1 tablet (20 mg total) by mouth daily. 02/04/23 02/04/24 Yes Sheikh, Omair Latif, DO  metFORMIN (GLUCOPHAGE) 500 MG tablet Take 500 mg by mouth 2 (two) times daily with a meal.   Yes [provider]  morphine (MS CONTIN) 15 MG 12 hr tablet Take 1  tablet (15 mg total) by mouth every 12 (twelve) hours. 02/02/23  Yes Pickenpack-Cousar, Carlena Sax, NP  Multiple Vitamin (MULTIVITAMIN WITH MINERALS) TABS tablet Take 1 tablet by mouth in the morning and at bedtime. When able to remember   Yes [provider]  ondansetron (ZOFRAN) 4 MG tablet Take 1 tablet (4 mg total) by mouth every 6 (six) hours as needed for nausea. 02/12/23  Yes Georgette Shell, MD  oxyCODONE (OXY IR/ROXICODONE) 5 MG immediate release tablet Take 1-2 tablets (5-10 mg total) by mouth every 4 (four) hours as needed for moderate pain, severe pain or breakthrough pain. Patient taking differently: Take 5 mg by mouth every 6 (six) hours as needed for moderate pain, severe pain or breakthrough pain. 02/02/23  Yes Pickenpack-Cousar, Carlena Sax, NP  pantoprazole (PROTONIX) 40 MG tablet Take 1 tablet (40 mg total) by mouth daily. Patient taking differently: Take 40 mg by mouth daily before breakfast. 12/25/22  Yes Baxley, Cresenciano Lick, MD  Blood Glucose Monitoring Suppl (TRUE METRIX METER) w/Device KIT USE AS DIRECTED 07/30/21   Elby Showers, MD  glucose blood (TRUE METRIX BLOOD GLUCOSE TEST) test strip TEST BLOOD SUGAR TWICE DAILY 10/10/22   Elby Showers, MD  HYDROcodone-acetaminophen (NORCO/VICODIN) 5-325 MG tablet Take 1 tablet by mouth every 6 (six) hours as needed. Patient taking differently: Take 1 tablet by mouth 2 (two) times daily as needed for moderate pain or severe pain. 12/27/22   Dorie Rank, MD  Lancets (FREESTYLE) lancets CHECK GLUCOSE TWICE A DAY 01/26/20   Elby Showers, MD  polyethylene glycol (MIRALAX / GLYCOLAX) 17 g packet Take 17 g by mouth daily. Patient not taking: Reported on 02/07/2023 02/05/23   Raiford Noble Latif, DO  senna-docusate (SENOKOT-S) 8.6-50 MG tablet Take 1 tablet by mouth at bedtime. Patient taking differently: Take 1 tablet by mouth daily. 02/04/23   Kerney Elbe, DO     Critical care time: 69 min     Romana Juniper, MD [facility] Internal Medicine Program - PGY-1 02/20/2023, 4:33 PM

## 2023-02-20 NOTE — Plan of Care (Signed)
  Problem: Education: Goal: Knowledge of General Education information will improve Description: Including pain rating scale, medication(s)/side effects and non-pharmacologic comfort measures Outcome: Progressing   Problem: Health Behavior/Discharge Planning: Goal: Ability to manage health-related needs will improve Outcome: Progressing   Problem: Clinical Measurements: Goal: Respiratory complications will improve Outcome: Progressing   Problem: Clinical Measurements: Goal: Cardiovascular complication will be avoided Outcome: Progressing   Problem: Activity: Goal: Risk for activity intolerance will decrease Outcome: Progressing   Problem: Nutrition: Goal: Adequate nutrition will be maintained Outcome: Progressing   Problem: Coping: Goal: Level of anxiety will decrease Outcome: Progressing   Problem: Elimination: Goal: Will not experience complications related to urinary retention Outcome: Progressing   Problem: Pain Managment: Goal: General experience of comfort will improve Outcome: Progressing   Problem: Safety: Goal: Ability to remain free from injury will improve Outcome: Progressing   Problem: Skin Integrity: Goal: Risk for impaired skin integrity will decrease Outcome: Progressing

## 2023-02-20 NOTE — Progress Notes (Addendum)
Rounding Note    Patient Name: Victoria [name] Date of Encounter: 02/20/2023  [place] Cardiologist: None   Subjective   On my exam, patient appears to be uncomfortable and complains of upper abdominal pain. She denies focal shortness of breath, palpitations, nausea, dizziness/lightheadedness.  Inpatient Medications    Scheduled Meds:  aspirin EC  81 mg Oral Daily   atorvastatin  80 mg Oral Daily   Chlorhexidine Gluconate Cloth  6 each Topical Q0600   insulin aspart  0-15 Units Subcutaneous TID WC   insulin aspart  0-5 Units Subcutaneous QHS   morphine  15 mg Oral Q12H   pantoprazole  40 mg Oral Daily   Continuous Infusions:  heparin 1,000 Units/hr (02/20/23 0000)   lactated ringers 125 mL/hr at 02/20/23 0300   PRN Meds: acetaminophen **OR** acetaminophen, HYDROmorphone (DILAUDID) injection, ondansetron **OR** ondansetron (ZOFRAN) IV   Vital Signs    Vitals:   02/20/23 0422 02/20/23 0611 02/20/23 0658 02/20/23 0800  BP: 94/66 (!) 91/59 (!) 91/56 (!) 70/43  Pulse: 79 76 83 81  Resp: '15 16 17 '$ (!) 21  Temp:  97.6 F (36.4 C)  98.1 F (36.7 C)  TempSrc:  Axillary  Oral  SpO2:  95%  93%  Weight:      Height:        Intake/Output Summary (Last 24 hours) at 02/20/2023 0801 Last data filed at 02/20/2023 0530 Gross per 24 hour  Intake 898.73 ml  Output 0 ml  Net 898.73 ml      02/20/2023    1:29 AM 02/04/2023    5:02 PM 02/06/2023    9:30 AM  Last 3 Weights  Weight (lbs) 191 lb 12.8 oz 185 lb 188 lb 7.9 oz  Weight (kg) 87 kg 83.915 kg 85.5 kg      Telemetry    Sinus rhythm. Intermittent tachycardia - Personally Reviewed  ECG    Sinus rhythm, low amplitude P waves - Personally Reviewed  Physical Exam   GEN: No acute distress.   Neck: No JVD Cardiac: RRR, no murmurs, rubs, or gallops.  Respiratory: coarse crackled bilaterally GI: Soft, significant epigastric tenderness MS: No edema; No deformity. Neuro:  Nonfocal  Psych: Normal affect    Labs    High Sensitivity Troponin:   Recent Labs  Lab 01/24/23 1045 01/24/23 1326 02/06/2023 1729 01/25/2023 2024 02/20/23 0542  TROPONINIHS 39* 38* 3,173* 2,170* 2,384*     Chemistry Recent Labs  Lab 01/24/2023 1707 02/08/2023 1729 02/20/23 0542  NA 137  --  135  K 4.3  --  4.1  CL 100  --  99  CO2 19*  --  23  GLUCOSE 214*  --  151*  BUN 38*  --  44*  CREATININE 3.62*  --  3.67*  CALCIUM 8.5*  --  7.9*  MG  --  1.9 1.8  PROT 6.0*  --  5.6*  ALBUMIN 2.2*  --  1.9*  AST 75*  --  67*  ALT 37  --  34  ALKPHOS 269*  --  242*  BILITOT 1.6*  --  1.2  GFRNONAA 12*  --  12*  ANIONGAP 18*  --  13    Lipids  Recent Labs  Lab 02/04/2023 2024  CHOL 164  TRIG 155*  HDL 23*  LDLCALC 110*  CHOLHDL 7.1    Hematology Recent Labs  Lab 01/24/2023 1707 02/20/23 0542  WBC 11.9* 8.7  RBC 2.86* 2.53*  HGB 8.2* 7.2*  HCT 25.2* 22.6*  MCV 88.1 89.3  MCH 28.7 28.5  MCHC 32.5 31.9  RDW 17.1* 17.5*  PLT 310 280   Thyroid No results for input(s): "TSH", "FREET4" in the last 168 hours.  BNPNo results for input(s): "BNP", "PROBNP" in the last 168 hours.  DDimer No results for input(s): "DDIMER" in the last 168 hours.   Radiology    CT CHEST ABDOMEN PELVIS WO CONTRAST  Result Date: 01/24/2023 CLINICAL DATA:  Sepsis. Cough. Abdominal pain. * Tracking Code: BO * EXAM: CT CHEST, ABDOMEN AND PELVIS WITHOUT CONTRAST TECHNIQUE: Multidetector CT imaging of the chest, abdomen and pelvis was performed following the standard protocol without IV contrast. RADIATION DOSE REDUCTION: This exam was performed according to the departmental dose-optimization program which includes automated exposure control, adjustment of the mA and/or kV according to patient size and/or use of iterative reconstruction technique. COMPARISON:  Chest CTA on 02/06/2023 and AP CT on 01/13/2023 FINDINGS: CT CHEST FINDINGS Cardiovascular: No acute findings. Mediastinum/Lymph Nodes: 2 cm low-attenuation left thyroid lobe nodule  noted. Mild mediastinal lymphadenopathy in the left lateral aortic and and precarinal regions shows no significant change. Hilar adenopathy is difficult to exclude on this noncontrast study. Lungs/Pleura: New small right pleural effusion is seen. Diffuse interstitial prominence is seen with numerous ill-defined pulmonary nodular opacities throughout both lungs. These findings show significant change compared to prior exam. Musculoskeletal:  No suspicious bone lesions identified. CT ABDOMEN AND PELVIS FINDINGS Hepatobiliary: Hepatic cirrhosis is again demonstrated. No masses visualized on this unenhanced exam. Prior cholecystectomy. No evidence of biliary obstruction. Pancreas: Diffuse pancreatic atrophy and ductal dilatation is again demonstrated. An ill-defined masslike opacity is seen in the area of the pancreatic head measuring approximately 4.2 x 3.8 cm, without significant change since prior study. Spleen:  Within normal limits in size. Adrenals/Urinary Tract: No evidence of urolithiasis or hydronephrosis. Unremarkable appearance of bladder. Stomach/Bowel: No evidence of obstruction, inflammatory process, or abnormal fluid collections. Severe diffuse colonic diverticulosis is again demonstrated, without evidence of diverticulitis. Normal appendix visualized. Vascular/Lymphatic: Mild peripancreatic lymphadenopathy is seen in the gastrohepatic ligament, porta hepatis, and aortocaval and left paraaortic regions, without significant no lymphadenopathy identified within the pelvis. No abdominal aortic aneurysm. Aortic atherosclerotic calcification incidentally noted. Reproductive: Prior hysterectomy noted. Adnexal regions are unremarkable in appearance. Other: Stable small bilateral inguinal hernias, which contain only fat. Small umbilical hernia containing only fat is also unchanged. Musculoskeletal:  No suspicious bone lesions identified. IMPRESSION: Stable diffuse interstitial prominence and numerous ill-defined  pulmonary nodular opacities throughout both lungs. New small right pleural effusion. Differential diagnosis includes metastatic disease and lymphangitic carcinomatosis, and atypical infectious or inflammatory etiologies. Stable ill-defined masslike opacity in the area of the pancreatic head, with pancreatic atrophy and ductal dilatation, suspicious for pancreatic carcinoma. Stable abdominal lymphadenopathy, suspicious for metastatic disease. Mild mediastinal lymphadenopathy is also stable. Hepatic cirrhosis. No evidence of hepatic neoplasm on this unenhanced exam. Severe colonic diverticulosis, without radiographic evidence of diverticulitis. 2 cm low-attenuation left thyroid lobe nodule. Recommend thyroid US. (ref: J Am Coll Radiol. 2015 Feb;12(2): 143-50). Electronically Signed   By: Marlaine Hind M.D.   On: 01/24/2023 20:16   DG Chest Port 1 View  Result Date: 02/15/2023 CLINICAL DATA:  Shortness of breath EXAM: PORTABLE CHEST 1 VIEW COMPARISON:  Chest radiograph and CTA chest 02/06/2023 FINDINGS: Is a new right chest wall port in place with the tip terminating in the mid SVC. The cardiomediastinal silhouette is stable. Extensive reticular and nodular opacities throughout both lungs have  overall worsened since the radiograph from 02/06/2023. There is no pleural effusion or pneumothorax There is no acute osseous abnormality. IMPRESSION: Extensive reticular and nodular opacities throughout both lungs have worsened since the radiograph from 02/06/2023 and essentially new from the radiograph from 01/31/2023. Findings are favored to reflect worsening multifocal infection and/or pulmonary interstitial edema given change over a relatively short interval; however, metastatic disease remains possible. Electronically Signed   By: Valetta Mole M.D.   On: 02/17/2023 17:53    Cardiac Studies   01/26/23 TTE  IMPRESSIONS     1. Left ventricular ejection fraction, by estimation, is 60 to 65%. The  left ventricle has  normal function. The left ventricle has no regional  wall motion abnormalities. There is mild concentric left ventricular  hypertrophy. Left ventricular diastolic  parameters are consistent with Grade I diastolic dysfunction (impaired  relaxation).   2. Right ventricular systolic function is normal. The right ventricular  size is normal. There is normal pulmonary artery systolic pressure.   3. No evidence of mitral valve regurgitation.   4. The aortic valve is grossly normal. Aortic valve regurgitation is not  visualized.   5. The inferior vena cava is normal in size with greater than 50%  respiratory variability, suggesting right atrial pressure of 3 mmHg.   Comparison(s): No significant change from prior study.   FINDINGS   Left Ventricle: Left ventricular ejection fraction, by estimation, is 60  to 65%. The left ventricle has normal function. The left ventricle has no  regional wall motion abnormalities. The left ventricular internal cavity  size was normal in size. There is   mild concentric left ventricular hypertrophy. Left ventricular diastolic  parameters are consistent with Grade I diastolic dysfunction (impaired  relaxation).   Right Ventricle: The right ventricular size is normal. Right ventricular  systolic function is normal. There is normal pulmonary artery systolic  pressure. The tricuspid regurgitant velocity is 2.71 m/s, and with an  assumed right atrial pressure of 3 mmHg,   the estimated right ventricular systolic pressure is [id number] mmHg.   Left Atrium: Left atrial size was normal in size.   Right Atrium: Right atrial size was normal in size.   Pericardium: There is no evidence of pericardial effusion.   Mitral Valve: No evidence of mitral valve regurgitation.   Tricuspid Valve: Tricuspid valve regurgitation is mild.   Aortic Valve: The aortic valve is grossly normal. Aortic valve  regurgitation is not visualized. Aortic valve mean gradient measures 3.0   mmHg. Aortic valve peak gradient measures 5.6 mmHg. Aortic valve area, by  VTI measures 2.78 cm.   Pulmonic Valve: Pulmonic valve regurgitation is not visualized.   Aorta: The aortic root and ascending aorta are structurally normal, with  no evidence of dilitation.   Venous: The inferior vena cava is normal in size with greater than 50%  respiratory variability, suggesting right atrial pressure of 3 mmHg.   IAS/Shunts: No atrial level shunt detected by color flow Doppler.    Patient Profile     Victoria [name] is a 82 y.o. female with a hx of metastatic pancreatic cancer, history of PE on Eliquis, hypertension, chronic hep C, diabetes who is being seen 02/20/2023 for the evaluation of SOB/elevated troponin at the request of Dr. Bridgett Larsson.  Assessment & Plan    Elevated troponin  Patient admitted with abdominal pain and shortness of breath, found to have elevated troponin 3173->2170->2384. ECG shows prolonged QT with normal sinus rhythm. No acute ischemic changes.  POCUS by overnight cardiology follow without RWMA. Recent TTE on 2/4 showed normal LV function.   Overall low suspicion for ACS. Likely type II MI/demand ischemia with significant metastatic burden. Repeat echocardiogram is pending, if LV function and wall motion appear normal, low threshold to discontinue heparin. This is a difficult situation as patient has had limited oral intake resulting in likely pre-renal AKI. Even if wall motion abnormalities were seen, invasive management would come with significant risk of renal failure. Patient given high intensity Atorvastatin on admission. Given overall poor prognosis and elevated LFTs, would not plan to continue this upon discharge. Will discuss further with Dr. Oval Linsey. No ability to add beta blocker at this time due to low/low-normal BP.  Shortness of breath  Patient received lasix during admission earlier this month. No clear indication of volume overload, pulmonary edema.  Imaging favors metastatic disease rather than pulmonary edema and given AKI with reported decreased oral intake, would avoid any diuresis at this time. However, would closely monitor respiratory status with IVF.  AKI  Creatinine up to 3.67 today, in the setting of very limited oral intake per family. Continue IVF per primary team.   Metabolic acidosis  Initial lactic acid 4.5, now improved to 2.0. Suspect acidosis is secondary to severe dehydration.   Metastatic cancer Epigastric pain  Patient with metastatic pancreatic cancer. Non-contrast CT ABD/Pelvis with numerous pulmonary nodularities, masslike opacity at pancreatic head, mediastinal and abdominal adenopathy.   Strongly support palliative care discussions.  Hx Pulmonary embolism  PE noted in November 2023. Patient has been on Eliquis since. Currently heparinized, would need to resume Eliquis if discontinued.        For questions or updates, please contact [place] Please consult [email or web address] for contact info under        Signed, Lily Kocher, PA-C  02/20/2023, 8:01 AM

## 2023-02-20 NOTE — Progress Notes (Signed)
14:20: Notified Dr. Maryland Pink about 10/10 chest pain, moaning in pain, hypotensive despite IVF bolus. Orders to hold pain medication while hypotensive, call rapid response, the critical care team to see patient, orders for blood product.   15:50: Critical care team assessed patient, no need to transfer at this time. Orders for palliative consult. Dr. Maryland Pink in contact with patient's family.

## 2023-02-20 NOTE — Progress Notes (Addendum)
[facility] Progress Note  Patient: Victoria [name]    [id number]  DOA: 01/29/2023    Date of Service: the patient was seen and examined on 02/20/2023  Brief hospital course: Patient is a 82 year old female with past medical history of metastatic pancreatic cancer, PE on Eliquis, chronic hepatitis C and diabetes mellitus who presented to the emergency room on 2/28 with complaints of chest pain and shortness of breath that started earlier in the day.  Patient had a recent hospitalization, discharged on 2/21 for pneumonia.  In the emergency room, lab work noteworthy for lactic acid level 3.4, soft blood pressure with a systolic of 90 and initial troponin of 3100.  In addition, patient with acute kidney injury and creatinine of 3.6 on admission.  (Was normal 8 days prior.)  Admitted to hospitalist service for further workup.  Cardiology consulted, and felt patient was not having a non-STEMI.  Troponins were elevated due to demand mismatch.  This morning progressed, patient noted to be more hypotensive and repeat hemoglobin down to 7.2.  Likely due to hypotension and severe anemia.  Possible blood loss.  Cardiology following although they are recommending stopping heparin.  Patient not appropriate for cardiac cath.   Assessment and Plan: Metastatic malignant neoplasm [facility]) Metastatic pancreatic cancer.  Normocytic anemia Follow-up hemoglobin is from this morning now at 7.2.  Down from 9.5 nine days ago.  In addition, patient with acute kidney injury and now worsening hypotension.  Highly suspicious for acute bleeding and patient who is on Eliquis.  Eliquis on hold.  Have stopped heparin infusion.  Ordering blood transfusion plus IV fluids plus midodrine.  Demand ischemia Not felt to be non-STEMI.  Elevated troponins felt to be demand ischemia, likely from hypotension and possibly critical anemia, possibly blood loss.  Continue supportive measures.  Have added additional IV fluids.  Transfusing  blood as above.  AKI (acute kidney injury) ([place]) Renal function normal on 2/20, but presented with creatinine of 3.62.  It is possible this could be from poor p.o. intake (albumin only 1.9) and continued use of Lasix.  Also possibly from decompensated heart failure?  Creatinine slightly worse despite holding Lasix and starting lactated ringers.  Will consult nephrology.  With new information, concern that this could be blood loss anemia.  Pancreatic cancer [facility]) Metastatic pancreatic cancer. Pt has recently placed port-a-cath. Oncology note from 02-17-2023 states that pt is not a candidate for chemotherapy. Appears pt having difficulty accepting her prognosis/diagnosis. May need oncology to see her in the hospital and review her prognosis again.  History of pulmonary embolism Family says that pt has not had any eliquis since Monday night(02-17-2023).  Started on heparin infusion on admission  Overweight (BMI 25.0-29.9) Meets criteria with BMI greater than 25  DM2 (diabetes mellitus, type 2) (HCC) Add SSI.       Body mass index is 29.16 kg/m.        Consultants: Nephrology Cardiology  Procedures: Echocardiogram pending  Antimicrobials: None  Code Status: Full code   Subjective: Patient somewhat drowsy, but tired, hungry  Objective: Noted earlier hypotension Vitals:   02/20/23 1100 02/20/23 1200  BP: 90/63   Pulse: 82   Resp: 15   Temp:    SpO2: 91% 91%    Intake/Output Summary (Last 24 hours) at 02/20/2023 1430 Last data filed at 02/20/2023 1300 Gross per 24 hour  Intake 898.73 ml  Output 100 ml  Net 798.73 ml   Filed Weights   02/15/2023 1702 02/20/23 0129  Weight: 83.9 kg 87 kg   Body mass index is 29.16 kg/m.  Exam:  General: Somnolent, but appropriate, oriented x 3 HEENT: Normocephalic, atraumatic, mucous membranes slightly dry Cardiovascular: Tachycardic, sinus rhythm Respiratory: Clear to auscultation bilaterally, decreased breath sounds at  bases Abdomen: Soft, nontender, nondistended, positive bowel sounds Musculoskeletal: No clubbing cyanosis or edema Skin: No skin breaks, tears or lesions Psychiatry: Appropriate, no evidence of psychoses Neurology: No focal deficits  Data Reviewed: Creatinine slightly worse today. BNP at 684  Disposition:  Status is: Inpatient   Anticipated discharge date: 3/5  Remaining issues to be resolved so that patient can be discharged:  -Improvement in renal function -Cardiac evaluation -Transfusion of blood and stabilization of blood pressure -Diuresis   Family Communication: Will call family DVT Prophylaxis: Place and maintain sequential compression device Start: 02/20/23 1422 SCDs Start: 02/20/23 0004    Author: Annita Brod ,MD 02/20/2023 2:30 PM  To reach On-call, see care teams to locate the attending and reach out via [email or web address]. Between 7PM-7AM, please contact night-coverage If you still have difficulty reaching the attending provider, please page the [facility] (Director on Call) for [facility] on amion for assistance.

## 2023-02-20 NOTE — Assessment & Plan Note (Addendum)
Not felt to be non-STEMI.  Elevated troponins felt to be demand ischemia, likely from hypotension and possibly critical anemia, possibly blood loss.  No further workup once she was comfort care

## 2023-02-20 NOTE — Consult Note (Signed)
Cardiology Consultation   Patient ID: AMME LUMPKIN MRN: [id number]; DOB: [birth year]  Admit date: 02/15/2023 Date of Consult: 02/20/2023  PCP:  Elby Showers, [place] Providers Cardiologist:  None        Patient Profile:   Victoria [name] is a 82 y.o. female with a hx of metastatic pancreatic cancer, history of PE on Eliquis, hypertension, chronic hep C, diabetes who is being seen 02/20/2023 for the evaluation of SOB/elevated troponin at the request of Dr. Bridgett Larsson  History of Present Illness:   Victoria [name] is a 82 y.o. female with a hx of metastatic pancreatic cancer, history of PE on Eliquis, hypertension, chronic hep C, diabetes who is being seen 02/20/2023 for the evaluation of SOB/elevated troponin at the request of Dr. Bridgett Larsson. Patient comes to the ED today with generalized abdominal pain and SOB. She denies chest pain; family also denies chest pain. She is slightly confused. Patient has not had any medicines since Monday evening. She has had 2 admissions recently for hypoxic RF. Here in the ED, her troponin were 3K and then 2K; EKG no significant ischemic changes. Severe AKI - Cr 3.6. Lactic acid elevated. Hemeglobin 8.2. Hospitalist was called for admission, and cards consulted for ?NSTEMI. Heparin was also started by the ED. Noncontrasted chest CT abdomen pelvis demonstrated multiple and numerous pulmonary nodularities throughout both lungs. New right pleural effusion. Continued masslike opacity at the pancreatic head. Mediastinal and abdominal adenopathy. Echo Feb 2024 was largely normal. Family states she has not had anything to drink or eat for days. I did POCUS myself-showed hyperdynamic LVEF   Past Medical History:  Diagnosis Date   Abnormal finding on Pap smear    Allergy    SEASONAL   Anxiety    Breast cancer ([place]) 2000   right breast lumptectomy, radiation done   Cirrhosis ([place]) 11/2012   resolved per pt   Diabetes mellitus type 2, controlled ([place])     Hepatitis C    took tx for    Hypertension    Obesity    Thrombocytopenia ([place]) 11/2012    Past Surgical History:  Procedure Laterality Date   ANTERIOR AND POSTERIOR REPAIR N/A 02/28/2021   Procedure: ANTERIOR (CYSTOCELE) AND POSTERIOR REPAIR (RECTOCELE);  Surgeon: Cheri Fowler, MD;  Location: [facility];  Service: Gynecology;  Laterality: N/A;   BIOPSY  01/09/2023   Procedure: BIOPSY;  Surgeon: Irving Copas., MD;  Location: [facility];  Service: Gastroenterology;;   BREAST BIOPSY Left 09/09/2016   BREAST LUMPECTOMY  right   BREAST LUMPECTOMY     BREAST REDUCTION SURGERY  2005   CATARACT EXTRACTION, BILATERAL  2014   CHOLECYSTECTOMY N/A 03/30/2013   Procedure: LAPAROSCOPIC CHOLECYSTECTOMY WITH INTRAOPERATIVE CHOLANGIOGRAM;  Surgeon: Shann Medal, MD;  Location: [facility];  Service: General;  Laterality: N/A;   COLONOSCOPY  2021   CYSTOSCOPY N/A 02/28/2021   Procedure: Consuela Mimes;  Surgeon: Cheri Fowler, MD;  Location: [place];  Service: Gynecology;  Laterality: N/A;   CYSTOSCOPY W/ RETROGRADES Left 02/28/2021   Procedure: CYSTOSCOPY WITH RETROGRADE PYELOGRAM;  Surgeon: Cheri Fowler, MD;  Location: [place];  Service: Gynecology;  Laterality: Left;   ESOPHAGOGASTRODUODENOSCOPY (EGD) WITH PROPOFOL N/A 11/25/2022   Procedure: ESOPHAGOGASTRODUODENOSCOPY (EGD) WITH PROPOFOL;  Surgeon: Carol Ada, MD;  Location: [place];  Service: Gastroenterology;  Laterality: N/A;   ESOPHAGOGASTRODUODENOSCOPY (EGD) WITH PROPOFOL N/A 01/09/2023   Procedure: ESOPHAGOGASTRODUODENOSCOPY (EGD) WITH PROPOFOL;  Surgeon: Rush Landmark,  Telford Nab., MD;  Location: [facility];  Service: Gastroenterology;  Laterality: N/A;   EUS  01/28/2013   Procedure: UPPER ENDOSCOPIC ULTRASOUND (EUS) LINEAR;  Surgeon: Milus Banister, MD;  [facility];  Service: Endoscopy;  Laterality: N/A;  nolani norberto [phone]   EUS Left  11/25/2022   Procedure: UPPER ENDOSCOPIC ULTRASOUND (EUS) LINEAR;  Surgeon: Carol Ada, MD;  Location: [place];  Service: Gastroenterology;  Laterality: Left;   EUS N/A 01/09/2023   Procedure: UPPER ENDOSCOPIC ULTRASOUND (EUS) LINEAR;  Surgeon: Irving Copas., MD;  [facility];  Service: Gastroenterology;  Laterality: N/A;   EXCISION OF SKIN TAG Right 03/30/2013   Procedure: EXCISION OF SKIN TAG;  Surgeon: Shann Medal, MD;  Location: [facility];  Service: General;  Laterality: Right;   FINE NEEDLE ASPIRATION  11/25/2022   Procedure: FINE NEEDLE ASPIRATION (FNA) LINEAR;  Surgeon: Carol Ada, MD;  Location: [place];  Service: Gastroenterology;;   FINE NEEDLE ASPIRATION N/A 01/09/2023   Procedure: FINE NEEDLE ASPIRATION (FNA) LINEAR;  Surgeon: Irving Copas., MD;  Location: [facility];  Service: Gastroenterology;  Laterality: N/A;   IR IMAGING GUIDED PORT INSERTION  02/07/2023   LIVER BIOPSY  03/30/2013   Procedure: LIVER BIOPSY;  Surgeon: Shann Medal, MD;  [facility];  Service: General;;   POLYPECTOMY     REDUCTION MAMMAPLASTY     THYROIDECTOMY, PARTIAL  1972   left   VAGINAL HYSTERECTOMY N/A 02/28/2021   Procedure: HYSTERECTOMY VAGINAL WITH UNILATERAL SALPINGECTOMY;  Surgeon: Cheri Fowler, MD;  Location: [place];  Service: Gynecology;  Laterality: N/A;       Inpatient Medications: Scheduled Meds:  aspirin EC  81 mg Oral Daily   atorvastatin  80 mg Oral Daily   insulin aspart  0-15 Units Subcutaneous TID WC   insulin aspart  0-5 Units Subcutaneous QHS   morphine  15 mg Oral Q12H   pantoprazole  40 mg Oral Daily   Continuous Infusions:  heparin 1,000 Units/hr (02/17/2023 2235)   lactated ringers 125 mL/hr at 02/20/23 0100   PRN Meds: acetaminophen **OR** acetaminophen, HYDROmorphone (DILAUDID) injection, ondansetron **OR** ondansetron (ZOFRAN) IV  Allergies:    Allergies  Allergen Reactions   Lisinopril Cough     Social History:   Social History   Socioeconomic History   Marital status: Divorced    Spouse name: Not on file   Number of children: 3   Years of education: Not on file   Highest education level: Not on file  Occupational History   Not on file  Tobacco Use   Smoking status: Never   Smokeless tobacco: Never  Vaping Use   Vaping Use: Never used  Substance and Sexual Activity   Alcohol use: No   Drug use: No   Sexual activity: Not Currently  Other Topics Concern   Not on file  Social History Narrative   Lives alone   Caffeine use: none   Right handed    Social Determinants of Health   Financial Resource Strain: Medium Risk (12/03/2021)   Overall Financial Resource Strain (CARDIA)    Difficulty of Paying Living Expenses: Somewhat hard  Food Insecurity: No Food Insecurity (02/07/2023)   Hunger Vital Sign    Worried About Running Out of Food in the Last Year: Never true    Ran Out of Food in the Last Year: Never true  Transportation Needs: No Transportation Needs (02/07/2023)   PRAPARE - Transportation    Lack  of Transportation (Medical): No    Lack of Transportation (Non-Medical): No  Physical Activity: Sufficiently Active (12/03/2021)   Exercise Vital Sign    Days of Exercise per Week: 4 days    Minutes of Exercise per Session: 60 min  Stress: No Stress Concern Present (12/03/2021)   [place]    Feeling of Stress : Only a little  Social Connections: Moderately Isolated (12/03/2021)   Social Connection and Isolation Panel [NHANES]    Frequency of Communication with Friends and Family: More than three times a week    Frequency of Social Gatherings with Friends and Family: More than three times a week    Attends Religious Services: Never    Marine scientist or Organizations: Yes    Attends Archivist Meetings: Never    Marital Status: Divorced  Human resources officer Violence: Not At  Risk (02/07/2023)   Humiliation, Afraid, Rape, and Kick questionnaire    Fear of Current or Ex-Partner: No    Emotionally Abused: No    Physically Abused: No    Sexually Abused: No    Family History:   Family History  Problem Relation Age of Onset   Diabetes Mother    Stroke Mother    Colon cancer Sister 44   Colon polyps Neg Hx    Esophageal cancer Neg Hx    Rectal cancer Neg Hx    Stomach cancer Neg Hx    Pancreatic cancer Neg Hx    Prostate cancer Neg Hx    Breast cancer Neg Hx      ROS:  Please see the history of present illness.  All other ROS reviewed and negative.     Physical Exam/Data:   Vitals:   02/17/2023 2300 02/01/2023 2315 02/15/2023 2356 02/20/23 0022  BP: 97/80  (!) 83/61 99/67  Pulse: 80 80 87 87  Resp: '18 12 20 15  '$ Temp:   98.1 F (36.7 C)   TempSrc:   Oral   SpO2: 100% 97% 96%   Weight:      Height:       No intake or output data in the 24 hours ending 02/20/23 0116    02/17/2023    5:02 PM 02/06/2023    9:30 AM 01/30/2023    5:00 AM  Last 3 Weights  Weight (lbs) 185 lb 188 lb 7.9 oz 188 lb 7.9 oz  Weight (kg) 83.915 kg 85.5 kg 85.5 kg     Body mass index is 28.13 kg/m.  General:  Well nourished, well developed, in moderate distress HEENT: normal Neck: no JVD Vascular: No carotid bruits; Distal pulses 2+ bilaterally Cardiac:  normal S1, S2; RRR; no murmur  Lungs:  clear to auscultation bilaterally, no wheezing, rhonchi or rales  Abd: very tender; non-soft Ext: mild  edema Musculoskeletal:  No deformities, BUE and BLE strength normal and equal Skin: warm and dry  Neuro:  CNs 2-12 intact, no focal abnormalities noted Psych:  Mildly pleasantly confused.    Relevant CV Studies:  Echo Feb 2024: Normal LVEF  Laboratory Data:  High Sensitivity Troponin:   Recent Labs  Lab 01/24/23 1045 01/24/23 1326 02/08/2023 1729 02/13/2023 2024  TROPONINIHS 39* 38* 3,173* 2,170*     Chemistry Recent Labs  Lab 02/05/2023 1707 02/15/2023 1729  NA 137   --   K 4.3  --   CL 100  --   CO2 19*  --   GLUCOSE 214*  --  BUN 38*  --   CREATININE 3.62*  --   CALCIUM 8.5*  --   MG  --  1.9  GFRNONAA 12*  --   ANIONGAP 18*  --     Recent Labs  Lab 01/25/2023 1707  PROT 6.0*  ALBUMIN 2.2*  AST 75*  ALT 37  ALKPHOS 269*  BILITOT 1.6*   Lipids  Recent Labs  Lab 02/07/2023 2024  CHOL 164  TRIG 155*  HDL 23*  LDLCALC 110*  CHOLHDL 7.1    Hematology Recent Labs  Lab 02/15/2023 1707  WBC 11.9*  RBC 2.86*  HGB 8.2*  HCT 25.2*  MCV 88.1  MCH 28.7  MCHC 32.5  RDW 17.1*  PLT 310   Thyroid No results for input(s): "TSH", "FREET4" in the last 168 hours.  BNPNo results for input(s): "BNP", "PROBNP" in the last 168 hours.  DDimer No results for input(s): "DDIMER" in the last 168 hours.   Radiology/Studies:  CT CHEST ABDOMEN PELVIS WO CONTRAST  Result Date: 01/28/2023 CLINICAL DATA:  Sepsis. Cough. Abdominal pain. * Tracking Code: BO * EXAM: CT CHEST, ABDOMEN AND PELVIS WITHOUT CONTRAST TECHNIQUE: Multidetector CT imaging of the chest, abdomen and pelvis was performed following the standard protocol without IV contrast. RADIATION DOSE REDUCTION: This exam was performed according to the departmental dose-optimization program which includes automated exposure control, adjustment of the mA and/or kV according to patient size and/or use of iterative reconstruction technique. COMPARISON:  Chest CTA on 02/06/2023 and AP CT on 01/13/2023 FINDINGS: CT CHEST FINDINGS Cardiovascular: No acute findings. Mediastinum/Lymph Nodes: 2 cm low-attenuation left thyroid lobe nodule noted. Mild mediastinal lymphadenopathy in the left lateral aortic and and precarinal regions shows no significant change. Hilar adenopathy is difficult to exclude on this noncontrast study. Lungs/Pleura: New small right pleural effusion is seen. Diffuse interstitial prominence is seen with numerous ill-defined pulmonary nodular opacities throughout both lungs. These findings show  significant change compared to prior exam. Musculoskeletal:  No suspicious bone lesions identified. CT ABDOMEN AND PELVIS FINDINGS Hepatobiliary: Hepatic cirrhosis is again demonstrated. No masses visualized on this unenhanced exam. Prior cholecystectomy. No evidence of biliary obstruction. Pancreas: Diffuse pancreatic atrophy and ductal dilatation is again demonstrated. An ill-defined masslike opacity is seen in the area of the pancreatic head measuring approximately 4.2 x 3.8 cm, without significant change since prior study. Spleen:  Within normal limits in size. Adrenals/Urinary Tract: No evidence of urolithiasis or hydronephrosis. Unremarkable appearance of bladder. Stomach/Bowel: No evidence of obstruction, inflammatory process, or abnormal fluid collections. Severe diffuse colonic diverticulosis is again demonstrated, without evidence of diverticulitis. Normal appendix visualized. Vascular/Lymphatic: Mild peripancreatic lymphadenopathy is seen in the gastrohepatic ligament, porta hepatis, and aortocaval and left paraaortic regions, without significant no lymphadenopathy identified within the pelvis. No abdominal aortic aneurysm. Aortic atherosclerotic calcification incidentally noted. Reproductive: Prior hysterectomy noted. Adnexal regions are unremarkable in appearance. Other: Stable small bilateral inguinal hernias, which contain only fat. Small umbilical hernia containing only fat is also unchanged. Musculoskeletal:  No suspicious bone lesions identified. IMPRESSION: Stable diffuse interstitial prominence and numerous ill-defined pulmonary nodular opacities throughout both lungs. New small right pleural effusion. Differential diagnosis includes metastatic disease and lymphangitic carcinomatosis, and atypical infectious or inflammatory etiologies. Stable ill-defined masslike opacity in the area of the pancreatic head, with pancreatic atrophy and ductal dilatation, suspicious for pancreatic carcinoma. Stable  abdominal lymphadenopathy, suspicious for metastatic disease. Mild mediastinal lymphadenopathy is also stable. Hepatic cirrhosis. No evidence of hepatic neoplasm on this unenhanced exam. Severe colonic diverticulosis,  without radiographic evidence of diverticulitis. 2 cm low-attenuation left thyroid lobe nodule. Recommend thyroid US. (ref: J Am Coll Radiol. 2015 Feb;12(2): 143-50). Electronically Signed   By: Marlaine Hind M.D.   On: 02/06/2023 20:16   DG Chest Port 1 View  Result Date: 01/26/2023 CLINICAL DATA:  Shortness of breath EXAM: PORTABLE CHEST 1 VIEW COMPARISON:  Chest radiograph and CTA chest 02/06/2023 FINDINGS: Is a new right chest wall port in place with the tip terminating in the mid SVC. The cardiomediastinal silhouette is stable. Extensive reticular and nodular opacities throughout both lungs have overall worsened since the radiograph from 02/06/2023. There is no pleural effusion or pneumothorax There is no acute osseous abnormality. IMPRESSION: Extensive reticular and nodular opacities throughout both lungs have worsened since the radiograph from 02/06/2023 and essentially new from the radiograph from 01/31/2023. Findings are favored to reflect worsening multifocal infection and/or pulmonary interstitial edema given change over a relatively short interval; however, metastatic disease remains possible. Electronically Signed   By: Valetta Mole M.D.   On: 02/01/2023 17:53     Assessment and Plan:   # Elevated Troponin # ?NSTEMI # Severe Metabolic Acidosis # Severe AKI # Metastatic Cancer  -Patient coming with generalized abdominal pain and SOB -Difficult to discern if true NSTEMI vs demand due to severe metastatic cancer -She looks dehydrated- which is causing acute kidney failure seems like in setting of cancer -POCUS shows hyperdynamic LVEF with no RWMA -Aggressive fluid resuscitation -Lactic acid elevated most likely due to low clearance from cirrhosis and  dehydration/cancer/?infection. Repeat lactic acid. Low concern for cardiac shock -Official Echo in AM -Okay to continue heparin for now for low suspicion for type 1 ACS -Overall prognosis is very guarded due to diffusely spread cancer.  -GOC will be done by oncology/IM team in AM -Will continue to trend troponin. Decide in AM after Echo/GOC discussion if any invasive management needs to be pursued.  -Telemetry  Signed, Jaci Lazier, MD  02/20/2023 1:16 AM

## 2023-02-20 NOTE — Assessment & Plan Note (Deleted)
Stage IV with lung mets  -diagnosed in 12/2022 -

## 2023-02-20 NOTE — Hospital Course (Signed)
Chest pain, SOB, abdominal pain   Poor po intake, continued taking lasix  Recent hospital discharge form [facility] 2/21 for PNA  Metastatic pancretic cancer PE on Eliquis HTN Chronic hepatitic C DM  Initial labs  nitial troponin was 3173.  Repeat troponin 2170. Sodium 137, potassium 4.3, bicarb of 19, BUN of 38, creatinine 3.6 WBC 11.9, hemoglobin 8.2, platelets of 310   Lipase of 27   Lactic acid of 3.4  CT wc c/a/p multiple pulmonary nodules, new R pleural effusion, mass like opacity in pancreatic head. Mediastinal and abdominal adenopathy.  TTE, not done yet Recent EGD on 01/09/23: no esophageal varices. Erythematous mucosa in the stomach. Mass noted on thein the head of the pancreas. No lesions in the duodenal bulb, first or second portion of the duodenum    Cardiology consulted for NSTEMI. Not a cath POCUS with hyperdynamic LVEF with  Fluids? Low suspicion for type 1 ACS  Acute on chronic anemia Hgb drop from 9.5 on recent discharge to 7.2 this AM. Patient now with worsening hypotension and tachycardia given Eliquis and now stopped heparin infusion. S/p blood transfusion, IV fluids, and midodrine. ?GI ?Nephrology pateint is not a candidate for ESA   NSTEMI vs demand ischemia 2/2 metatatic cancer Demand ischemia No chest pain or ischemic changes on EKG Switched to home Eliquis?  Severe metabolic acidosis Severe AKI Nephrology; AKI from ATN c/b hypotensiopn. Given advanced cancer, not a good dialysis cancdidate as she would not tolerate it   Cirrhosis  Metastatic malignant neoplasm Port-a-cath in place; recent oncology note pateint is not a candidate for chemotherapy Not a cath candidate  Hx Pulmonary embolism  Pain management Currently on morphine 15 mg q12 HR Dilaudid 1 mg q2HR  Full code Patient amenable to [place]

## 2023-02-20 NOTE — Progress Notes (Signed)
Patient's hospital condition continued to deteriorate across afternoon.  Transfused 1 unit of blood.  Still remaining hypotensive and put on fluids before blood.  Critical care consulted.  Little options given advanced cancer.  After discussion with patient's granddaughter,, family all present in patient's room.  Patient mentation poor and not able to interact.  Oncology followed up and discussed prognosis and implications.  Family opted to make patient comfort care.  All medications except those for pain, nausea and agitation continued.  Telemetry, monitoring and labs stopped.  Will finish unit of blood already halfway through transfusion.

## 2023-02-20 NOTE — Progress Notes (Signed)
ANTICOAGULATION CONSULT NOTE   Pharmacy Consult for heparin Indication: chest pain/ACS and Hx VTE  Allergies  Allergen Reactions   Lisinopril Cough    Patient Measurements: Height: '5\' 8"'$  (172.7 cm) Weight: 87 kg (191 lb 12.8 oz) IBW/kg (Calculated) : 63.9 Heparin Dosing Weight: 81kg  Vital Signs: Temp: 98 F (36.7 C) (02/29 0829) Temp Source: Oral (02/29 0829) BP: 90/63 (02/29 1100) Pulse Rate: 82 (02/29 1100)  Labs: Recent Labs    02/18/2023 1707 02/18/2023 1729 02/09/2023 2024 02/20/23 0542  HGB 8.2*  --   --  7.2*  HCT 25.2*  --   --  22.6*  PLT 310  --   --  280  APTT  --   --   --  98*  HEPARINUNFRC  --   --   --  >1.10*  CREATININE 3.62*  --   --  3.67*  TROPONINIHS  --  3,173* 2,170* 2,384*     Estimated Creatinine Clearance: 14.3 mL/min (A) (by C-G formula based on SCr of 3.67 mg/dL (H)).   Medical History: Past Medical History:  Diagnosis Date   Abnormal finding on Pap smear    Allergy    SEASONAL   Anxiety    Breast cancer ([place]) 2000   right breast lumptectomy, radiation done   Cirrhosis ([place]) 11/2012   resolved per pt   Diabetes mellitus type 2, controlled ([place])    Hepatitis C    took tx for    Hypertension    Obesity    Thrombocytopenia ([place]) 11/2012    Assessment: 81 YOF presenting with abdominal pain and chest pain, hx of DVT/PE on Eliquis PTA with last dose 2/27 AM. No plans for cardiac cath per notes -aPTT= 98 on heparin 1000 units/hr -hg= 7.2 (recent history of low Hg)  Goal of Therapy:  Heparin level 0.3-0.7 units/ml aPTT 66-102 seconds Monitor platelets by anticoagulation protocol: Yes   Plan:  -Continue heparin 1000 units/hr -Daily heparin level and CBC -Will follow plans to transition to apixaban  Hildred Laser, PharmD Clinical Pharmacist **Pharmacist phone directory can now be found on [email or web address] ([email or web address]).  Listed under [place].

## 2023-02-20 NOTE — Progress Notes (Signed)
MEWS Progress Note  Patient Details Name: Victoria [name] MRN: [id number] DOB: 1941-07-24 Today's Date: 02/20/2023   MEWS Flowsheet Documentation:  Assess: MEWS Score Temp: 98.1 F (36.7 C) BP: 99/67 MAP (mmHg): 79 Pulse Rate: 87 ECG Heart Rate: 91 Resp: 15 Level of Consciousness: Responds to Voice SpO2: 96 % O2 Device: Nasal Cannula Patient Activity (if Appropriate): In bed O2 Flow Rate (L/min): 2 L/min Assess: MEWS Score MEWS Temp: 0 MEWS Systolic: 1 MEWS Pulse: 0 MEWS RR: 0 MEWS LOC: 1 MEWS Score: 2 MEWS Score Color: Yellow Assess: SIRS CRITERIA SIRS Temperature : 0 SIRS Respirations : 0 SIRS Pulse: 1 SIRS WBC: 0 SIRS Score Sum : 1 SIRS Temperature : 0 SIRS Pulse: 1 SIRS Respirations : 0 SIRS WBC: 0 SIRS Score Sum : 1 Assess: if the MEWS score is Yellow or Red Were vital signs taken at a resting state?: Yes Focused Assessment: No change from prior assessment Does the patient meet 2 or more of the SIRS criteria?: No Does the patient have a confirmed or suspected source of infection?: No Provider and Rapid Response Notified?: No MEWS guidelines implemented : Yes, yellow Treat MEWS Interventions: Considered administering scheduled or prn medications/treatments as ordered Take Vital Signs Increase Vital Sign Frequency : Yellow: Q2hr x1, continue Q4hrs until patient remains green for 12hrs Escalate MEWS: Escalate: Yellow: Discuss with charge nurse and consider notifying provider and/or RRT    Kaylyn Lim 02/20/2023, 12:46 AM

## 2023-02-20 NOTE — Hospital Course (Addendum)
Patient is a 82 year old female with past medical history of metastatic pancreatic cancer, PE on Eliquis, chronic hepatitis C and diabetes mellitus who presented to the emergency room on 2/28 with complaints of chest pain and shortness of breath that started earlier in the day.  Patient had a recent hospitalization, discharged on 2/21 for pneumonia.  In the emergency room, lab work noteworthy for lactic acid level 3.4, soft blood pressure with a systolic of 90 and initial troponin of 3100.  In addition, patient with acute kidney injury and creatinine of 3.6 on admission.  (Was normal 8 days prior.)  Admitted to hospitalist service for further workup.  Cardiology consulted, and felt patient was not having a non-STEMI.  Troponins were elevated due to demand mismatch.  During the day of 2/29, follow-up hemoglobin down to 7.2 and patient with persistent hypotension and starting to have mentation changes as well as progressive hypoxia.  Concern was for possible acute blood loss.  Patient transfused 1 unit packed red blood cells.  After extensive discussion with family, oncology and critical care, with overarching diagnosis of metastatic pancreatic cancer, family opted to make patient comfort care.  Patient given only medicine for pain, agitation.  Seen by hospice and felt to be appropriate, with pending transfer to [facility] once bed available.

## 2023-02-20 NOTE — Significant Event (Addendum)
Rapid Response Event Note   Reason for Call :  Hypotension 70/59, chest pain  Initial Focused Assessment:  Pt lying in bed, alert. Moaning in discomfort. Endorses epigastric pain, 8/10. Pt endorses worsening pain to abdominal palpation. Bowel sounds positive. Lung sounds are clear. Breathing is regular, unlabored. Skin is warm, dry, pink. Peripheral pulses 1+.   VS: T 97.37F, BP 70/59, HR 80, RR 19, SpO2 97% on 6LNC  Interventions:  -EKG -Midodrine now  Plan of Care:  -MAP currently >65, Levophed gtt not started -Waiting on PCCM consult  Call rapid response for additional needs  Event Summary:  MD Notified: per RN Call Time: [place] Time: [id number] End Time: [place], RN

## 2023-02-20 NOTE — Consult Note (Signed)
[facility] Renal Consultation Note  Requesting MD: [place] Pink Indication for Consultation: AKI  HPI:  Victoria [name] is a 82 y.o. female with HTN, chronic Hepatitis C, DM as well as metastatic pancreatic cancer-  not a candidate for treatment-  poor social support and performance status-  onc recommending DNR.  She has had several hospitalizations -  most recently 2/15 to 2/21 for PNA/anemia req transfusion-  had mild AKI but resolved with hydration-  crt on 2/20 was 0.75. She comes in today with a failure to thrive type picture-  pain and SOB-  no meds since Monday-  hypothermic and hypotensive- crt now in the 3's- had positive troponin.  Pt is currently moaning in pain-  still hypotensive and tachycardic-  does say "Im [name] " was on lasix as OP but again not taking meds      PMHx:   Past Medical History:  Diagnosis Date   Abnormal finding on Pap smear    Allergy    SEASONAL   Anxiety    Breast cancer ([place]) 2000   right breast lumptectomy, radiation done   Cirrhosis ([place]) 11/2012   resolved per pt   Diabetes mellitus type 2, controlled ([place])    Hepatitis C    took tx for    Hypertension    Obesity    Thrombocytopenia ([place]) 11/2012    Past Surgical History:  Procedure Laterality Date   ANTERIOR AND POSTERIOR REPAIR N/A 02/28/2021   Procedure: ANTERIOR (CYSTOCELE) AND POSTERIOR REPAIR (RECTOCELE);  Surgeon: Cheri Fowler, MD;  Location: [facility];  Service: Gynecology;  Laterality: N/A;   BIOPSY  01/09/2023   Procedure: BIOPSY;  Surgeon: Irving Copas., MD;  [facility];  Service: Gastroenterology;;   BREAST BIOPSY Left 09/09/2016   BREAST LUMPECTOMY  right   BREAST LUMPECTOMY     BREAST REDUCTION SURGERY  2005   CATARACT EXTRACTION, BILATERAL  2014   CHOLECYSTECTOMY N/A 03/30/2013   Procedure: LAPAROSCOPIC CHOLECYSTECTOMY WITH INTRAOPERATIVE CHOLANGIOGRAM;  Surgeon: Shann Medal, MD;  [facility];   Service: General;  Laterality: N/A;   COLONOSCOPY  2021   CYSTOSCOPY N/A 02/28/2021   Procedure: Consuela Mimes;  Surgeon: Cheri Fowler, MD;  Location: [place];  Service: Gynecology;  Laterality: N/A;   CYSTOSCOPY W/ RETROGRADES Left 02/28/2021   Procedure: CYSTOSCOPY WITH RETROGRADE PYELOGRAM;  Surgeon: Cheri Fowler, MD;  Location: [place];  Service: Gynecology;  Laterality: Left;   ESOPHAGOGASTRODUODENOSCOPY (EGD) WITH PROPOFOL N/A 11/25/2022   Procedure: ESOPHAGOGASTRODUODENOSCOPY (EGD) WITH PROPOFOL;  Surgeon: Carol Ada, MD;  Location: [place];  Service: Gastroenterology;  Laterality: N/A;   ESOPHAGOGASTRODUODENOSCOPY (EGD) WITH PROPOFOL N/A 01/09/2023   Procedure: ESOPHAGOGASTRODUODENOSCOPY (EGD) WITH PROPOFOL;  Surgeon: Rush Landmark Telford Nab., MD;  [facility];  Service: Gastroenterology;  Laterality: N/A;   EUS  01/28/2013   Procedure: UPPER ENDOSCOPIC ULTRASOUND (EUS) LINEAR;  Surgeon: Milus Banister, MD;  [facility];  Service: Endoscopy;  Laterality: N/A;  shaylon plaster [phone]   EUS Left 11/25/2022   Procedure: UPPER ENDOSCOPIC ULTRASOUND (EUS) LINEAR;  Surgeon: Carol Ada, MD;  Location: [place];  Service: Gastroenterology;  Laterality: Left;   EUS N/A 01/09/2023   Procedure: UPPER ENDOSCOPIC ULTRASOUND (EUS) LINEAR;  Surgeon: Irving Copas., MD;  [facility];  Service: Gastroenterology;  Laterality: N/A;   EXCISION OF SKIN TAG Right 03/30/2013   Procedure: EXCISION OF SKIN TAG;  Surgeon: Shann Medal, MD;  [facility];  Service: General;  Laterality: Right;   FINE NEEDLE ASPIRATION  11/25/2022   Procedure: FINE NEEDLE ASPIRATION (FNA) LINEAR;  Surgeon: Carol Ada, MD;  Location: [place];  Service: Gastroenterology;;   FINE NEEDLE ASPIRATION N/A 01/09/2023   Procedure: FINE NEEDLE ASPIRATION (FNA) LINEAR;  Surgeon: Irving Copas., MD;  [facility];  Service: Gastroenterology;  Laterality: N/A;   IR IMAGING GUIDED PORT INSERTION  02/07/2023   LIVER BIOPSY  03/30/2013   Procedure: LIVER BIOPSY;  Surgeon: Shann Medal, MD;  [facility];  Service: General;;   POLYPECTOMY     REDUCTION MAMMAPLASTY     THYROIDECTOMY, PARTIAL  1972   left   VAGINAL HYSTERECTOMY N/A 02/28/2021   Procedure: HYSTERECTOMY VAGINAL WITH UNILATERAL SALPINGECTOMY;  Surgeon: Cheri Fowler, MD;  Location: [place];  Service: Gynecology;  Laterality: N/A;    Family Hx:  Family History  Problem Relation Age of Onset   Diabetes Mother    Stroke Mother    Colon cancer Sister 62   Colon polyps Neg Hx    Esophageal cancer Neg Hx    Rectal cancer Neg Hx    Stomach cancer Neg Hx    Pancreatic cancer Neg Hx    Prostate cancer Neg Hx    Breast cancer Neg Hx     Social History:  reports that she has never smoked. She has never used smokeless tobacco. She reports that she does not drink alcohol and does not use drugs.  Allergies:  Allergies  Allergen Reactions   Lisinopril Cough    Medications: Prior to Admission medications   Medication Sig Start Date End Date Taking? Authorizing Provider  ALPRAZolam (XANAX) 0.5 MG tablet TAKE 1 TABLET(0.5 MG) BY MOUTH TWICE DAILY AS NEEDED FOR ANXIETY Patient taking differently: Take 0.5 mg by mouth in the morning and at bedtime. 08/12/22  Yes Baxley, Cresenciano Lick, MD  apixaban (ELIQUIS) 5 MG TABS tablet Take 1 tablet (5 mg total) by mouth 2 (two) times daily. 01/11/23  Yes Mansouraty, Telford Nab., MD  benzonatate (TESSALON PERLES) 100 MG capsule Take 1 capsule (100 mg total) by mouth 3 (three) times daily as needed for cough. Patient taking differently: Take 100 mg by mouth 2 (two) times daily as needed for cough. 01/20/23 01/20/24 Yes Lavina Hamman, MD  cholecalciferol (VITAMIN D3) 25 MCG (1000 UNIT) tablet Take 1,000 Units by mouth daily.   Yes [provider]  ferrous sulfate 325 (65  FE) MG tablet Take 1 tablet (325 mg total) by mouth daily. Patient taking differently: Take 325 mg by mouth daily. When able to remember 01/20/23 01/20/24 Yes Lavina Hamman, MD  furosemide (LASIX) 20 MG tablet Take 1 tablet (20 mg total) by mouth daily. 02/04/23 02/04/24 Yes Sheikh, Omair Latif, DO  metFORMIN (GLUCOPHAGE) 500 MG tablet Take 500 mg by mouth 2 (two) times daily with a meal.   Yes [provider]  morphine (MS CONTIN) 15 MG 12 hr tablet Take 1 tablet (15 mg total) by mouth every 12 (twelve) hours. 02/02/23  Yes Pickenpack-Cousar, Carlena Sax, NP  Multiple Vitamin (MULTIVITAMIN WITH MINERALS) TABS tablet Take 1 tablet by mouth in the morning and at bedtime. When able to remember   Yes [provider]  ondansetron (ZOFRAN) 4 MG tablet Take 1 tablet (4 mg total) by mouth every 6 (six) hours as needed for nausea. 02/12/23  Yes Georgette Shell, MD  oxyCODONE (OXY IR/ROXICODONE) 5 MG immediate release  tablet Take 1-2 tablets (5-10 mg total) by mouth every 4 (four) hours as needed for moderate pain, severe pain or breakthrough pain. Patient taking differently: Take 5 mg by mouth every 6 (six) hours as needed for moderate pain, severe pain or breakthrough pain. 02/02/23  Yes Pickenpack-Cousar, Carlena Sax, NP  pantoprazole (PROTONIX) 40 MG tablet Take 1 tablet (40 mg total) by mouth daily. Patient taking differently: Take 40 mg by mouth daily before breakfast. 12/25/22  Yes Baxley, Cresenciano Lick, MD  Blood Glucose Monitoring Suppl (TRUE METRIX METER) w/Device KIT USE AS DIRECTED 07/30/21   Elby Showers, MD  glucose blood (TRUE METRIX BLOOD GLUCOSE TEST) test strip TEST BLOOD SUGAR TWICE DAILY 10/10/22   Elby Showers, MD  HYDROcodone-acetaminophen (NORCO/VICODIN) 5-325 MG tablet Take 1 tablet by mouth every 6 (six) hours as needed. Patient taking differently: Take 1 tablet by mouth 2 (two) times daily as needed for moderate pain or severe pain. 12/27/22   Dorie Rank, MD  Lancets (FREESTYLE)  lancets CHECK GLUCOSE TWICE A DAY 01/26/20   Elby Showers, MD  polyethylene glycol (MIRALAX / GLYCOLAX) 17 g packet Take 17 g by mouth daily. Patient not taking: Reported on 02/07/2023 02/05/23   Raiford Noble Latif, DO  senna-docusate (SENOKOT-S) 8.6-50 MG tablet Take 1 tablet by mouth at bedtime. Patient taking differently: Take 1 tablet by mouth daily. 02/04/23   Raiford Noble Latif, DO    I have reviewed the patient's current medications.  Labs:  Results for orders placed or performed during the hospital encounter of 02/14/2023 (from the past 48 hour(s))  CBG monitoring, ED     Status: Abnormal   Collection Time: 02/07/2023  4:53 PM  Result Value Ref Range   Glucose-Capillary 177 (H) 70 - 99 mg/dL    Comment: Glucose reference range applies only to samples taken after fasting for at least 8 hours.  CBC with Differential     Status: Abnormal   Collection Time: 01/30/2023  5:07 PM  Result Value Ref Range   WBC 11.9 (H) 4.0 - 10.5 K/uL   RBC 2.86 (L) 3.87 - 5.11 MIL/uL   Hemoglobin 8.2 (L) 12.0 - 15.0 g/dL   HCT 25.2 (L) 36.0 - 46.0 %   MCV 88.1 80.0 - 100.0 fL   MCH 28.7 26.0 - 34.0 pg   MCHC 32.5 30.0 - 36.0 g/dL   RDW 17.1 (H) 11.5 - 15.5 %   Platelets 310 150 - 400 K/uL   nRBC 0.3 (H) 0.0 - 0.2 %   Neutrophils Relative % 87 %   Neutro Abs 10.5 (H) 1.7 - 7.7 K/uL   Lymphocytes Relative 5 %   Lymphs Abs 0.6 (L) 0.7 - 4.0 K/uL   Monocytes Relative 7 %   Monocytes Absolute 0.8 0.1 - 1.0 K/uL   Eosinophils Relative 0 %   Eosinophils Absolute 0.0 0.0 - 0.5 K/uL   Basophils Relative 0 %   Basophils Absolute 0.0 0.0 - 0.1 K/uL   Immature Granulocytes 1 %   Abs Immature Granulocytes 0.06 0.00 - 0.07 K/uL    Comment: Performed at [facility], [address], [place], [place] [address]  Comprehensive metabolic panel     Status: Abnormal   Collection Time: 02/18/2023  5:07 PM  Result Value Ref Range   Sodium 137 135 - 145 mmol/L   Potassium 4.3 3.5 - 5.1 mmol/L   Chloride 100 98 -  111 mmol/L   CO2 19 (L) 22 -  32 mmol/L   Glucose, Bld 214 (H) 70 - 99 mg/dL    Comment: Glucose reference range applies only to samples taken after fasting for at least 8 hours.   BUN 38 (H) 8 - 23 mg/dL   Creatinine, Ser 3.62 (H) 0.44 - 1.00 mg/dL   Calcium 8.5 (L) 8.9 - 10.3 mg/dL   Total Protein 6.0 (L) 6.5 - 8.1 g/dL   Albumin 2.2 (L) 3.5 - 5.0 g/dL   AST 75 (H) 15 - 41 U/L   ALT 37 0 - 44 U/L   Alkaline Phosphatase 269 (H) 38 - 126 U/L   Total Bilirubin 1.6 (H) 0.3 - 1.2 mg/dL   GFR, Estimated 12 (L) >60 mL/min    Comment: (NOTE) Calculated using the CKD-EPI Creatinine Equation (2021)    Anion gap 18 (H) 5 - 15    Comment: Performed at [facility], [place] [address] [place], [place] [address]  Lipase, blood     Status: None   Collection Time: 02/05/2023  5:29 PM  Result Value Ref Range   Lipase 27 11 - 51 U/L    Comment: Performed at [facility] [address] [place], [place] [address]  Troponin I (High Sensitivity)     Status: Abnormal   Collection Time: 02/14/2023  5:29 PM  Result Value Ref Range   Troponin I (High Sensitivity) 3,173 (HH) <18 ng/L    Comment: CRITICAL RESULT CALLED TO, READ BACK BY AND VERIFIED WITH Inda Merlin, RN @ [phone] 01/28/2023 BY [facility] (NOTE) Elevated high sensitivity troponin I (hsTnI) values and significant  changes across serial measurements may suggest ACS but many other  chronic and acute conditions are known to elevate hsTnI results.  Refer to the "Links" section for chest pain algorithms and additional  guidance. Performed at [facility], [place] [address], [place], [place] [address]   Lactic acid, plasma     Status: Abnormal   Collection Time: 02/05/2023  5:29 PM  Result Value Ref Range   Lactic Acid, Venous 3.4 (HH) 0.5 - 1.9 mmol/L    Comment: CRITICAL RESULT CALLED TO, READ BACK BY AND VERIFIED WITH Inda Merlin, RN @ 1935 02/10/2023 BY [facility] Performed at [facility], [place] [address], [place], [place] [address]    Magnesium     Status: None   Collection Time: 01/23/2023  5:29 PM  Result Value Ref Range   Magnesium 1.9 1.7 - 2.4 mg/dL    Comment: Performed at [facility] [address], [place], [place] [address]  Phosphorus     Status: Abnormal   Collection Time: 02/11/2023  5:29 PM  Result Value Ref Range   Phosphorus 6.6 (H) 2.5 - 4.6 mg/dL    Comment: Performed at [facility] [address] [place], [place] [address]  Lactic acid, plasma     Status: Abnormal   Collection Time: 02/12/2023  8:24 PM  Result Value Ref Range   Lactic Acid, Venous 4.5 (HH) 0.5 - 1.9 mmol/L    Comment: CRITICAL VALUE NOTED. VALUE IS CONSISTENT WITH PREVIOUSLY REPORTED/CALLED VALUE Performed at [facility], [place] [address] [place], [place] [address]   Troponin I (High Sensitivity)     Status: Abnormal   Collection Time: 01/30/2023  8:24 PM  Result Value Ref Range   Troponin I (High Sensitivity) 2,170 (HH) <18 ng/L    Comment: CRITICAL VALUE NOTED. VALUE IS CONSISTENT WITH PREVIOUSLY REPORTED/CALLED VALUE (NOTE)  Elevated high sensitivity troponin I (hsTnI) values and significant  changes across serial measurements may suggest ACS but many other  chronic and acute conditions are known to elevate hsTnI results.  Refer to the "Links" section for chest pain algorithms and additional  guidance. Performed at [facility], [place] [address] [place], [place] [address]   Lipid panel     Status: Abnormal   Collection Time: 02/10/2023  8:24 PM  Result Value Ref Range   Cholesterol 164 0 - 200 mg/dL   Triglycerides 155 (H) <150 mg/dL   HDL 23 (L) >40 mg/dL   Total CHOL/HDL Ratio 7.1 RATIO   VLDL 31 0 - 40 mg/dL   LDL Cholesterol 110 (H) 0 - 99 mg/dL    Comment:        Total Cholesterol/HDL:CHD Risk Coronary Heart Disease Risk Table                     Men   Women  1/2 Average Risk   3.4   3.3  Average Risk       5.0   4.4  2 X Average Risk   9.6   7.1  3 X Average Risk  23.4   11.0        Use  the calculated Patient Ratio above and the CHD Risk Table to determine the patient's CHD Risk.        ATP III CLASSIFICATION (LDL):  <100     mg/dL   Optimal  100-129  mg/dL   Near or Above                    Optimal  130-159  mg/dL   Borderline  160-189  mg/dL   High  >190     mg/dL   Very High Performed at [facility] [address] [place], [place] [address]   Glucose, capillary     Status: Abnormal   Collection Time: 02/20/23  1:07 AM  Result Value Ref Range   Glucose-Capillary 182 (H) 70 - 99 mg/dL    Comment: Glucose reference range applies only to samples taken after fasting for at least 8 hours.  Lactic acid, plasma     Status: Abnormal   Collection Time: 02/20/23  2:31 AM  Result Value Ref Range   Lactic Acid, Venous 2.0 (HH) 0.5 - 1.9 mmol/L    Comment: CRITICAL VALUE NOTED. VALUE IS CONSISTENT WITH PREVIOUSLY REPORTED/CALLED VALUE Performed at [facility], [place] [address] [place], [place] [address]   APTT     Status: Abnormal   Collection Time: 02/20/23  5:42 AM  Result Value Ref Range   aPTT 98 (H) 24 - 36 seconds    Comment:        IF BASELINE aPTT IS ELEVATED, SUGGEST PATIENT RISK ASSESSMENT BE USED TO DETERMINE APPROPRIATE ANTICOAGULANT THERAPY. Performed at [facility], [place] [address], [place], [place] [address]   Heparin level (unfractionated)     Status: Abnormal   Collection Time: 02/20/23  5:42 AM  Result Value Ref Range   Heparin Unfractionated >1.10 (H) 0.30 - 0.70 IU/mL    Comment: (NOTE) The clinical reportable range upper limit is being lowered to >1.10 to align with the FDA approved guidance for the current laboratory assay.  If heparin results are below expected values, and patient dosage has  been confirmed, suggest follow up testing of antithrombin III levels. Performed at [facility], [place] [address], [place], [place] [address]   Comprehensive metabolic panel     Status: Abnormal   Collection Time: 02/20/23   5:42 AM  Result Value Ref Range   Sodium 135 135 - 145 mmol/L   Potassium 4.1 3.5 - 5.1 mmol/L   Chloride 99 98 - 111 mmol/L   CO2 23 22 - 32 mmol/L   Glucose, Bld 151 (H) 70 - 99 mg/dL    Comment: Glucose reference range applies only to samples taken after fasting for at least 8 hours.   BUN 44 (H) 8 - 23 mg/dL   Creatinine, Ser 3.67 (H) 0.44 - 1.00 mg/dL   Calcium 7.9 (L) 8.9 - 10.3 mg/dL   Total Protein 5.6 (L) 6.5 - 8.1 g/dL   Albumin 1.9 (L) 3.5 - 5.0 g/dL   AST 67 (H) 15 - 41 U/L   ALT 34 0 - 44 U/L   Alkaline Phosphatase 242 (H) 38 - 126 U/L   Total Bilirubin 1.2 0.3 - 1.2 mg/dL   GFR, Estimated 12 (L) >60 mL/min    Comment: (NOTE) Calculated using the CKD-EPI Creatinine Equation (2021)    Anion gap 13 5 - 15    Comment: Performed at [facility], [place] [address] [place], [place] [address]  CBC with Differential/Platelet     Status: Abnormal   Collection Time: 02/20/23  5:42 AM  Result Value Ref Range   WBC 8.7 4.0 - 10.5 K/uL   RBC 2.53 (L) 3.87 - 5.11 MIL/uL   Hemoglobin 7.2 (L) 12.0 - 15.0 g/dL   HCT 22.6 (L) 36.0 - 46.0 %   MCV 89.3 80.0 - 100.0 fL   MCH 28.5 26.0 - 34.0 pg   MCHC 31.9 30.0 - 36.0 g/dL   RDW 17.5 (H) 11.5 - 15.5 %   Platelets 280 150 - 400 K/uL   nRBC 0.7 (H) 0.0 - 0.2 %   Neutrophils Relative % 70 %   Neutro Abs 6.2 1.7 - 7.7 K/uL   Lymphocytes Relative 16 %   Lymphs Abs 1.4 0.7 - 4.0 K/uL   Monocytes Relative 11 %   Monocytes Absolute 1.0 0.1 - 1.0 K/uL   Eosinophils Relative 1 %   Eosinophils Absolute 0.1 0.0 - 0.5 K/uL   Basophils Relative 1 %   Basophils Absolute 0.0 0.0 - 0.1 K/uL   Immature Granulocytes 1 %   Abs Immature Granulocytes 0.04 0.00 - 0.07 K/uL    Comment: Performed at [facility] [address], [place], [place] [address]  Magnesium     Status: None   Collection Time: 02/20/23  5:42 AM  Result Value Ref Range   Magnesium 1.8 1.7 - 2.4 mg/dL    Comment: Performed at [facility] [address] [place], [place] [address]  Troponin I (High Sensitivity)     Status: Abnormal   Collection Time: 02/20/23  5:42 AM  Result Value Ref Range   Troponin I (High Sensitivity) 2,384 (HH) <18 ng/L    Comment: CRITICAL VALUE NOTED. VALUE IS CONSISTENT WITH PREVIOUSLY REPORTED/CALLED VALUE (NOTE) Elevated high sensitivity troponin I (hsTnI) values and significant  changes across serial measurements may suggest ACS but many other  chronic and acute conditions are known to elevate hsTnI results.  Refer to the "Links" section for chest pain algorithms and additional  guidance. Performed at [facility], [place] [address], [place], [place] [address]   Glucose, capillary  Status: Abnormal   Collection Time: 02/20/23  5:59 AM  Result Value Ref Range   Glucose-Capillary 132 (H) 70 - 99 mg/dL    Comment: Glucose reference range applies only to samples taken after fasting for at least 8 hours.  Glucose, capillary     Status: Abnormal   Collection Time: 02/20/23  7:58 AM  Result Value Ref Range   Glucose-Capillary 147 (H) 70 - 99 mg/dL    Comment: Glucose reference range applies only to samples taken after fasting for at least 8 hours.     ROS:  Review of systems not obtained due to patient factors.  Physical Exam: Vitals:   02/20/23 0829 02/20/23 1100  BP: 107/72 90/63  Pulse: 85 82  Resp: 18 15  Temp: 98 F (36.7 C)   SpO2: 97% 91%     General: moaning in pain-  pointing to belly -  not able to have conversation HEENT: PERRLA, EOMI  Neck: no JVD Heart: tachy Lungs: poor effort , dec BS Abdomen: distended -  tender Extremities: mild pitting edema Skin: warm and dry Neuro: alert but distracted by discomfort-  able to tell me her name  Assessment/Plan: 82 year old BF with metastatic pancreatic CA-  not candidate for treatment -  FTT with multiple hospitalizations-  this one with AKI 1.Renal- Pt most likely has AKI from ATN and hypotension.  She may have established ATN thus  will not improve with only fluids alone-  will take some time and likely will worsen before it improves.  This is my assumption based on what I see and patient is not a candidate really for more of a work up to rule out or in other things.  It is said to say but I think she is dying and conversations regarding [place] need to happen.  I would not do dialysis on this patient - she would not tolerate and I think her life expectancy can be measured in days.  Her renal failure may get worse before it gets better if it gets better 2. Hypertension/volume  - hypotensive /tachycardic but also has edema since her albumin is in the 1's.  Giving fluids now which is OK but she may not tolerate a lot more from a hypoxia standpoint 3. Anemia  - complication of her cancer and not surprising.  Not a candidate for ESA  Renal does not have anything more to add- will sign off    Louis Meckel 02/20/2023, 11:55 AM

## 2023-02-20 NOTE — Progress Notes (Signed)
Date and time results received: 02/20/23 0349 (use smartphrase ".now" to insert current time)  Test: Lactic Acid  Critical Value: 2  Name of Provider Notified: Argie Ramming, MD   Orders Received? Or Actions Taken?: No order.

## 2023-02-20 NOTE — Progress Notes (Signed)
No urine output since she came to the unit around 2350H. Bladder scan done = 81 ml. (See intake record). Denied pain on suprapubic area. On purewick. Encouraged to use the [facility]. Patient refused. Alert and oriented x 2-3. Plan of care ongoing.

## 2023-02-21 ENCOUNTER — Inpatient Hospital Stay: Payer: Medicare HMO

## 2023-02-21 DIAGNOSIS — Z7189 Other specified counseling: Secondary | ICD-10-CM

## 2023-02-21 DIAGNOSIS — Z515 Encounter for palliative care: Secondary | ICD-10-CM | POA: Diagnosis not present

## 2023-02-21 DIAGNOSIS — I2489 Other forms of acute ischemic heart disease: Secondary | ICD-10-CM | POA: Diagnosis not present

## 2023-02-21 DIAGNOSIS — D649 Anemia, unspecified: Secondary | ICD-10-CM | POA: Diagnosis not present

## 2023-02-21 DIAGNOSIS — N179 Acute kidney failure, unspecified: Secondary | ICD-10-CM | POA: Diagnosis not present

## 2023-02-21 DIAGNOSIS — C250 Malignant neoplasm of head of pancreas: Secondary | ICD-10-CM | POA: Diagnosis not present

## 2023-02-21 LAB — TYPE AND SCREEN
ABO/RH(D): O POS
Antibody Screen: NEGATIVE
Unit division: 0

## 2023-02-21 LAB — BPAM RBC
Blood Product Expiration Date: 202403292359
ISSUE DATE / TIME: 202402291641
Unit Type and Rh: 5100

## 2023-02-21 MED ORDER — HYDROMORPHONE HCL 1 MG/ML IJ SOLN
1.0000 mg | Freq: Once | INTRAMUSCULAR | Status: AC
  Administered 2023-02-21: 1 mg via INTRAVENOUS
  Filled 2023-02-21: qty 1

## 2023-02-21 NOTE — Progress Notes (Signed)
Patient  and family  refused pain medication,patient repositioned,will continue to monitor.

## 2023-02-21 NOTE — Progress Notes (Signed)
[facility] Progress Note  Patient: Victoria [name]    [id number]  DOA: 02/18/2023    Date of Service: the patient was seen and examined on 02/21/2023  Brief hospital course: Patient is a 82 year old female with past medical history of metastatic pancreatic cancer, PE on Eliquis, chronic hepatitis C and diabetes mellitus who presented to the emergency room on 2/28 with complaints of chest pain and shortness of breath that started earlier in the day.  Patient had a recent hospitalization, discharged on 2/21 for pneumonia.  In the emergency room, lab work noteworthy for lactic acid level 3.4, soft blood pressure with a systolic of 90 and initial troponin of 3100.  In addition, patient with acute kidney injury and creatinine of 3.6 on admission.  (Was normal 8 days prior.)  Admitted to hospitalist service for further workup.  Cardiology consulted, and felt patient was not having a non-STEMI.  Troponins were elevated due to demand mismatch.  During the day of 2/29, follow-up hemoglobin down to 7.2 and patient with persistent hypotension and starting to have mentation changes as well as progressive hypoxia.  Concern was for possible acute blood loss.  Patient transfused 1 unit packed red blood cells.  After extensive discussion with family, oncology and critical care, with overarching diagnosis of metastatic pancreatic cancer, family opted to make patient comfort care.  Patient given only medicine for pain, agitation.  Seen by hospice and felt to be appropriate, with pending transfer to [facility] once bed available.   Assessment and Plan: Metastatic malignant neoplasm [facility]) Metastatic pancreatic cancer.  Normocytic anemia Follow-up hemoglobin on morning of 2/29 at 7.2.  Down from 9.5 nine days ago.  In addition, patient with acute kidney injury and now worsening hypotension.  Highly suspicious for acute bleed and patient on Eliquis.  Received 1 unit packed red blood cells.  Now that patient is  comfort care, no further interventions.  Demand ischemia Not felt to be non-STEMI.  Elevated troponins felt to be demand ischemia, likely from hypotension and possibly critical anemia, possibly blood loss.  No further workup now that she is comfort care  AKI (acute kidney injury) ([place]) Renal function normal on 2/20, but presented with creatinine of 3.62.  It is possible this could be from poor p.o. intake (albumin only 1.9) and continued use of Lasix.  Also possibly from decompensated heart failure?  Creatinine slightly worse despite holding Lasix and starting lactated ringers.  Nephrology consulted.  Underlying issue now felt to be blood loss.  Workup discontinued now that she is comfort care.  Pancreatic cancer [facility]) Metastatic pancreatic cancer. Pt has recently placed port-a-cath. Oncology note from 02-17-2023 states that pt is not a candidate for chemotherapy. Appears pt having difficulty accepting her prognosis/diagnosis.  Oncology met with family on the evening of 2/29.  Mentation of patient had greatly worsened so she was not able to participate in discussion.  She is now comfort care, awaiting transfer to hospice facility.  History of pulmonary embolism Family says that pt has not had any eliquis since Monday night(02-17-2023).  Started on heparin infusion on admission, discontinued after bleed.  Overweight (BMI 25.0-29.9) Meets criteria with BMI greater than 25  DM2 (diabetes mellitus, type 2) (HCC) Add SSI.       Body mass index is 29.16 kg/m.        Consultants: Nephrology Cardiology Palliative care Critical care  Procedures: 1 unit packed red blood cell transfusion 2/29  Antimicrobials: None  Code Status: Full code  Subjective: Somnolent  Objective: Vitals changed to once a day as per comfort care Vitals:   02/20/23 1948 02/21/23 0036  BP: (!) 71/59 (!) 78/43  Pulse: 72 69  Resp: 14   Temp:  97.7 F (36.5 C)  SpO2: 96%     Intake/Output Summary  (Last 24 hours) at 02/21/2023 1737 Last data filed at 02/21/2023 1620 Gross per 24 hour  Intake 0 ml  Output 0 ml  Net 0 ml    Filed Weights   02/02/2023 1702 02/20/23 0129  Weight: 83.9 kg 87 kg   Body mass index is 29.16 kg/m.  Exam:  General: Somnolent, appears comfortable Cardiovascular: Regular rate and rhythm Respiratory: Clear to auscultation bilaterally, decreased breath sounds at bases  Data Reviewed: No further lab work now that she is comfort care  Disposition:  Status is: Inpatient   Anticipated discharge date: 3/2, or later once bed available at hospice  Remaining issues to be resolved so that patient can be discharged:  Awaiting hospice bed   Family Communication: Will call family DVT Prophylaxis: None, comfort care    Author: Annita Brod ,MD 02/21/2023 5:37 PM  To reach On-call, see care teams to locate the attending and reach out via [email or web address]. Between 7PM-7AM, please contact night-coverage If you still have difficulty reaching the attending provider, please page the [facility] (Director on Call) for [facility] on amion for assistance.

## 2023-02-21 NOTE — Progress Notes (Signed)
Manufacturing engineer [facility]) Hospital Liaison Note  Received request from [facility] [place] for family interest in [facility]. Visited patient at bedside and spoke with son/Quinton to confirm interest and explain services.  Approval for [facility] is determined by [facility] MD & patient has been approved.  Unfortunately, [facility] is not able to offer a room today. Family and TOC aware hospital liaison will follow up tomorrow or sooner if a room becomes available.    Please do not hesitate to call with any hospice related questions.    Thank you for the opportunity to participate in this patient's care.   Phillis Haggis, MSW [facility] Liaison [phone]

## 2023-02-21 NOTE — Progress Notes (Signed)
Patient transfer from [address],patient alert to self,patient  made comfortable in room,on call notified for a diet order,family at bedside,will continue to monitor.

## 2023-02-21 NOTE — Consult Note (Signed)
   [facility] Inpatient Consult   02/21/2023  KAILIA DEVILLA July 07, 1941 [id number]  [facility] [ACO] Patient: [employer]  Primary Care Provider: Elby Showers, MD  Reviewed due less than 7 days readmission and [facility] RNCM call attempts prior to admission noted from recent [facility] referral.  Patient reviewed on unit rounds today.  Chart reviewed and reveals the patient is currently transitioning to [place] and for Hospice facility for transition when bed available.   Plan: Patient will have full case management services through Hospice and needs will be met at the hospice level of care. No further [facility] Care Management is planned for transitional needs.  Will sign off at transition from hospital.  For questions,   Natividad Brood, RN BSN [place]  [phone] business mobile phone Toll free office [phone]  *[place]  [phone] Fax number: [phone] [email or web address]

## 2023-02-21 NOTE — Consult Note (Signed)
Palliative Medicine Inpatient Consult Note  Consulting Provider: Dr. Wallene Dales  Reason for consult:   Victoria [name] Palliative Medicine Consult  Reason for Consult? advanced pancreatic cancer, actively dying.   02/21/2023  HPI:  Per intake H&P --> 82 year old African-American female history of metastatic pancreatic cancer, history of PE on Eliquis, hypertension, chronic hep C, diabetes.  Came to ER on 2/28 with chest pain and shortness of breath.  Dr. Burr Medico met with Victoria [name] and her family last night and explained patient MSOF and poor overall prognosis. She recommended transition to IP hospice which family were in agreement with. Scope of care transitioned to comfort only.  Palliative care ask to get involved to support symptoms and plan for transition out of the hospital.   PMT has seen Victoria [name] multiple times in the last month.   Clinical Assessment/Goals of Care:  *Please note that this is a verbal dictation therefore any spelling or grammatical errors are due to the "[place]" system interpretation.  I have reviewed medical records including EPIC notes, labs and imaging, received report from bedside RN, assessed the patient who is lying in bed in NAD.    I called patients son, Victoria [name] to further discuss diagnosis prognosis, GOC, EOL wishes, disposition and options.   I introduced Palliative Medicine as specialized medical care for people living with serious illness. It focuses on providing relief from the symptoms and stress of a serious illness. The goal is to improve quality of life for both the patient and the family.  Medical History Review and Understanding:  Reviewed Victoria [name]'s PMH significant for hepatitis C, right breast cancer s/p lumpectomy, type 2 diabetes, hypertension, thrombocytopenia, PE/DVT, and recent diagnosis of pancreatic cancer (12/2022)   Social History:  Analie was living at home by herself. She has 3 children. 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   Functional and Nutritional State:  Notable decline in functional state and appetite over the past few months. Has had recurrent SOB which has precluded activity.   Advance Directives:  A detailed discussion was had today regarding advanced directives.    Code Status:  Concepts specific to code status, artifical feeding and hydration, continued IV antibiotics and rehospitalization was had.  The difference between a aggressive medical intervention path  and a palliative comfort care path for this patient at this time was had.   Established DNAR/DNI  Discussion:  Discussed patients disease burden in the setting of metastatic pancreatic cancer. Reviewed her decline over the past two months. Discussed the goal of comfort at this time.   We talked about transition to comfort measures in house and what that would entail inclusive of medications to control pain, dyspnea, agitation, nausea, itching, and hiccups.   We discussed stopping all uneccessary measures such as cardiac monitoring, blood draws, needle sticks, and frequent vital signs. Utilized reflective listening throughout our time together.   Further reviewed the plan for transition to Victoria [name]. Patients son prefers a facility closest to patients home in [place].  Discussed the importance of continued conversation with family and their  medical providers regarding overall plan of care and treatment options, ensuring decisions are within the context of the patients values and GOCs.  Decision Maker: [name] Son [phone]  SUMMARY OF RECOMMENDATIONS   DNAR/DNI  Comfort Care  Comfort medications per MAY  TOC - IP Hospice  Ongoing PMT support  Code Status/Advance Care Planning: DNAR/DNI  Palliative Prophylaxis:  Aspiration, Bowel Regimen, Delirium Protocol, Frequent  Pain Assessment, Oral Care, Palliative Wound Care, and Turn Reposition  Additional Recommendations  (Limitations, Scope, Preferences): Comfort Care  Psycho-social/Spiritual:  Desire for further Chaplaincy support: Not presently Additional Recommendations: Education on end of life process   Prognosis: Limited to days.   Discharge Planning: Discharge to Victoria [name].   Vitals:   02/20/23 1948 02/21/23 0036  BP: (!) 71/59 (!) 78/43  Pulse: 72 69  Resp: 14   Temp:  97.7 F (36.5 C)  SpO2: 96%     Intake/Output Summary (Last 24 hours) at 02/21/2023 [id number] Last data filed at 02/20/2023 1500 Gross per 24 hour  Intake 2143.91 ml  Output 100 ml  Net 2043.91 ml   Last Weight  Most recent update: 02/20/2023  1:31 AM    Weight  87 kg (191 lb 12.8 oz)            Gen:  Frail elderly AA F in NAD HEENT: Dry mucous membranes CV: Regular rate and rhythm  PULM:  On 6LPM [place], breathing even and nonlabored ABD: soft/nontender  EXT: No edema  Neuro: Alert and oriented   PPS: 10%   This conversation/these recommendations were discussed with patient primary care team, Dr. Kevan Rosebush  Billing based on MDM: High  ______________________________________________________ [place] Team Team Cell Phone: [phone] Please utilize secure chat with additional questions, if there is no response within 30 minutes please call the above phone number  Palliative Medicine Team providers are available by phone from 7am to 7pm daily and can be reached through the team cell phone.  Should this patient require assistance outside of these hours, please call the patient's attending physician.

## 2023-02-21 NOTE — Progress Notes (Signed)
   Palliative Medicine Inpatient Follow Up Note  I went to patients bedside this evening and introduced myself in person to her son and five additional family members present at bedside.  We reviewed that Wallace has been accepted to [facility] and we are awaiting a bed for transfer.   We further discussed s/s of encroaching death as well as symptoms of distress.  Patients family asked about feeding her, I shared if awake and alert bu all means allow to have what she wants but otherwise there is no need to push food or fluids at this stage.  Provided palliative support through therapeutic listening.   Additional Time: 15 ______________________________________________________________________________________ [place] Team Team Cell Phone: [phone] Please utilize secure chat with additional questions, if there is no response within 30 minutes please call the above phone number  Palliative Medicine Team providers are available by phone from 7am to 7pm daily and can be reached through the team cell phone.  Should this patient require assistance outside of these hours, please call the patient's attending physician.

## 2023-02-21 NOTE — Social Work (Signed)
CSW notified that pt's family is requesting inpatient hospice placement in the [facility] facility. CSW notified [facility] Liaison to review pt. Pt was approved, but no beds are available today. TOC will continue to follow.

## 2023-02-21 DEATH — deceased

## 2023-02-24 LAB — CULTURE, BLOOD (ROUTINE X 2)
Culture: NO GROWTH
Culture: NO GROWTH

## 2023-02-28 ENCOUNTER — Inpatient Hospital Stay: Payer: Medicare HMO

## 2023-02-28 ENCOUNTER — Inpatient Hospital Stay: Payer: Medicare HMO | Admitting: Hematology

## 2023-03-06 ENCOUNTER — Other Ambulatory Visit: Payer: Medicare HMO

## 2023-03-06 ENCOUNTER — Ambulatory Visit: Payer: Medicare HMO | Admitting: Hematology

## 2023-03-06 ENCOUNTER — Ambulatory Visit: Payer: Medicare HMO

## 2023-03-12 ENCOUNTER — Other Ambulatory Visit: Payer: Self-pay | Admitting: Internal Medicine

## 2023-03-24 NOTE — Progress Notes (Signed)
Bed placement notified around 0500,no active funeral home on file.Post mortem checklist done.

## 2023-03-24 NOTE — Progress Notes (Signed)
Family at bedside. Patient still has apical heart rate of 31. Unable to accurately count respirations. Patient showing signs of end of life.

## 2023-03-24 NOTE — Progress Notes (Signed)
    OVERNIGHT PROGRESS REPORT  Notified by RN that patient has expired at [facility].  2 RN verified. Patient was comfort care.   Family is at bedside.   Raenette Rover, DNP, [place]

## 2023-03-24 NOTE — Death Summary Note (Signed)
DEATH SUMMARY   Patient Details  Name: Victoria [name] MRN: [id number] DOB: 1941/02/06 [id number], Cresenciano Lick, MD Admission/Discharge Information   Admit Date:  15-Mar-2023  Date of Death: Date of Death: March 18, 2023  Time of Death: Time of Death: 0225  Length of Stay: 2   Principle Cause of death: Metastatic pancreatic cancer however, patient expired on  Hospital Diagnoses: Active Problems:   Normocytic anemia   Demand ischemia   AKI (acute kidney injury) ([place])   Pancreatic cancer ([place])   History of pulmonary embolism   Overweight (BMI 25.0-29.9)   DM2 (diabetes mellitus, type 2) [facility])   Hospital Course: Patient is a 82 year old female with past medical history of metastatic pancreatic cancer, PE on Eliquis, chronic hepatitis C and diabetes mellitus who presented to the emergency room on 15-Mar-2023 with complaints of chest pain and shortness of breath that started earlier in the day.  Patient had a recent hospitalization, discharged on 2/21 for pneumonia.  In the emergency room, lab work noteworthy for lactic acid level 3.4, soft blood pressure with a systolic of 90 and initial troponin of 3100.  In addition, patient with acute kidney injury and creatinine of 3.6 on admission.  (Was normal 8 days prior.)  Admitted to hospitalist service for further workup.  Cardiology consulted, and felt patient was not having a non-STEMI.  Troponins were elevated due to demand mismatch.  During the day of 2/29, follow-up hemoglobin down to 7.2 and patient with persistent hypotension and starting to have mentation changes as well as progressive hypoxia.  Concern was for possible acute blood loss.  Patient transfused 1 unit packed red blood cells.  After extensive discussion with family, oncology and critical care, with overarching diagnosis of metastatic pancreatic cancer, family opted to make patient comfort care.  Patient given only medicine for pain, agitation.  Seen by hospice and felt to be appropriate, with  pending transfer to [facility] once bed available, however, patient expired on early morning hours of 3/2.  Assessment and Plan: Metastatic malignant neoplasm [facility]) Metastatic pancreatic cancer.  Normocytic anemia Follow-up hemoglobin on morning of 2/29 at 7.2.  Down from 9.5 nine days ago.  In addition, patient with acute kidney injury and now worsening hypotension.  Highly suspicious for acute bleed and patient on Eliquis.  Received 1 unit packed red blood cells.  Once she was made comfort care, no further interventions.  Demand ischemia Not felt to be non-STEMI.  Elevated troponins felt to be demand ischemia, likely from hypotension and possibly critical anemia, possibly blood loss.  No further workup once she was comfort care  AKI (acute kidney injury) ([place]) Renal function normal on 2/20, but presented with creatinine of 3.62.  It is possible this could be from poor p.o. intake (albumin only 1.9) and continued use of Lasix.  Also possibly from decompensated heart failure?  Creatinine slightly worse despite holding Lasix and starting lactated ringers.  Nephrology consulted.  Underlying issue now felt to be blood loss.  Workup discontinued after being made comfort care.  Pancreatic cancer [facility]) Metastatic pancreatic cancer. Pt has recently placed port-a-cath. Oncology note from 02-17-2023 states that pt is not a candidate for chemotherapy. Appears pt having difficulty accepting her prognosis/diagnosis.  Oncology met with family on the evening of 2/29.  Mentation of patient had greatly worsened so she was not able to participate in discussion.    History of pulmonary embolism Family says that pt has not had any eliquis since Monday night(02-17-2023).  Started  on heparin infusion on admission, discontinued after bleed.  Overweight (BMI 25.0-29.9) Met criteria with BMI greater than 25  DM2 (diabetes mellitus, type 2) ([place]) Prior to comfort care, treated with sliding scale          Procedures: 1 unit packed red blood cell transfusion  Consultations:  -Oncology -Palliative care -Critical care -Neph for allergy  The results of significant diagnostics from this hospitalization (including imaging, microbiology, ancillary and laboratory) are listed below for reference.   Significant Diagnostic Studies: CT CHEST ABDOMEN PELVIS WO CONTRAST  Result Date: 01/26/2023 CLINICAL DATA:  Sepsis. Cough. Abdominal pain. * Tracking Code: BO * EXAM: CT CHEST, ABDOMEN AND PELVIS WITHOUT CONTRAST TECHNIQUE: Multidetector CT imaging of the chest, abdomen and pelvis was performed following the standard protocol without IV contrast. RADIATION DOSE REDUCTION: This exam was performed according to the departmental dose-optimization program which includes automated exposure control, adjustment of the mA and/or kV according to patient size and/or use of iterative reconstruction technique. COMPARISON:  Chest CTA on 02/06/2023 and AP CT on 01/13/2023 FINDINGS: CT CHEST FINDINGS Cardiovascular: No acute findings. Mediastinum/Lymph Nodes: 2 cm low-attenuation left thyroid lobe nodule noted. Mild mediastinal lymphadenopathy in the left lateral aortic and and precarinal regions shows no significant change. Hilar adenopathy is difficult to exclude on this noncontrast study. Lungs/Pleura: New small right pleural effusion is seen. Diffuse interstitial prominence is seen with numerous ill-defined pulmonary nodular opacities throughout both lungs. These findings show significant change compared to prior exam. Musculoskeletal:  No suspicious bone lesions identified. CT ABDOMEN AND PELVIS FINDINGS Hepatobiliary: Hepatic cirrhosis is again demonstrated. No masses visualized on this unenhanced exam. Prior cholecystectomy. No evidence of biliary obstruction. Pancreas: Diffuse pancreatic atrophy and ductal dilatation is again demonstrated. An ill-defined masslike opacity is seen in the area of the pancreatic head  measuring approximately 4.2 x 3.8 cm, without significant change since prior study. Spleen:  Within normal limits in size. Adrenals/Urinary Tract: No evidence of urolithiasis or hydronephrosis. Unremarkable appearance of bladder. Stomach/Bowel: No evidence of obstruction, inflammatory process, or abnormal fluid collections. Severe diffuse colonic diverticulosis is again demonstrated, without evidence of diverticulitis. Normal appendix visualized. Vascular/Lymphatic: Mild peripancreatic lymphadenopathy is seen in the gastrohepatic ligament, porta hepatis, and aortocaval and left paraaortic regions, without significant no lymphadenopathy identified within the pelvis. No abdominal aortic aneurysm. Aortic atherosclerotic calcification incidentally noted. Reproductive: Prior hysterectomy noted. Adnexal regions are unremarkable in appearance. Other: Stable small bilateral inguinal hernias, which contain only fat. Small umbilical hernia containing only fat is also unchanged. Musculoskeletal:  No suspicious bone lesions identified. IMPRESSION: Stable diffuse interstitial prominence and numerous ill-defined pulmonary nodular opacities throughout both lungs. New small right pleural effusion. Differential diagnosis includes metastatic disease and lymphangitic carcinomatosis, and atypical infectious or inflammatory etiologies. Stable ill-defined masslike opacity in the area of the pancreatic head, with pancreatic atrophy and ductal dilatation, suspicious for pancreatic carcinoma. Stable abdominal lymphadenopathy, suspicious for metastatic disease. Mild mediastinal lymphadenopathy is also stable. Hepatic cirrhosis. No evidence of hepatic neoplasm on this unenhanced exam. Severe colonic diverticulosis, without radiographic evidence of diverticulitis. 2 cm low-attenuation left thyroid lobe nodule. Recommend thyroid US. (ref: J Am Coll Radiol. 2015 Feb;12(2): 143-50). Electronically Signed   By: Marlaine Hind M.D.   On: 02/02/2023  20:16   DG Chest Port 1 View  Result Date: 01/24/2023 CLINICAL DATA:  Shortness of breath EXAM: PORTABLE CHEST 1 VIEW COMPARISON:  Chest radiograph and CTA chest 02/06/2023 FINDINGS: Is a new right chest wall port in place with the  tip terminating in the mid SVC. The cardiomediastinal silhouette is stable. Extensive reticular and nodular opacities throughout both lungs have overall worsened since the radiograph from 02/06/2023. There is no pleural effusion or pneumothorax There is no acute osseous abnormality. IMPRESSION: Extensive reticular and nodular opacities throughout both lungs have worsened since the radiograph from 02/06/2023 and essentially new from the radiograph from 01/31/2023. Findings are favored to reflect worsening multifocal infection and/or pulmonary interstitial edema given change over a relatively short interval; however, metastatic disease remains possible. Electronically Signed   By: Valetta Mole M.D.   On: 01/30/2023 17:53   IR IMAGING GUIDED PORT INSERTION  Result Date: 02/07/2023 INDICATION: 82 year old female with history of pancreatic cancer requiring central venous access for chemotherapy administration. EXAM: IMPLANTED PORT A CATH PLACEMENT WITH ULTRASOUND AND FLUOROSCOPIC GUIDANCE COMPARISON:  None Available. MEDICATIONS: None. ANESTHESIA/SEDATION: Moderate (conscious) sedation was employed during this procedure. A total of Versed 2 mg and Fentanyl 100 mcg was administered intravenously. Moderate Sedation Time: 14 minutes. The patient's level of consciousness and vital signs were monitored continuously by radiology nursing throughout the procedure under my direct supervision. CONTRAST:  None FLUOROSCOPY TIME:  Two mGy COMPLICATIONS: None immediate. PROCEDURE: The procedure, risks, benefits, and alternatives were explained to the patient. Questions regarding the procedure were encouraged and answered. The patient understands and consents to the procedure. The right neck and  chest were prepped with chlorhexidine in a sterile fashion, and a sterile drape was applied covering the operative field. Maximum barrier sterile technique with sterile gowns and gloves were used for the procedure. A timeout was performed prior to the initiation of the procedure. Ultrasound was used to examine the jugular vein which was compressible and free of internal echoes. A skin marker was used to demarcate the planned venotomy and port pocket incision sites. Local anesthesia was provided to these sites and the subcutaneous tunnel track with 1% lidocaine with 1:100,000 epinephrine. A small incision was created at the jugular access site and blunt dissection was performed of the subcutaneous tissues. Under ultrasound guidance, the jugular vein was accessed with a 21 ga micropuncture needle and an 0.018" wire was inserted to the superior vena cava. Real-time ultrasound guidance was utilized for vascular access including the acquisition of a permanent ultrasound image documenting patency of the accessed vessel. A 5 Fr micopuncture set was then used, through which a 0.035" Rosen wire was passed under fluoroscopic guidance into the inferior vena cava. An 8 Fr dilator was then placed over the wire. A subcutaneous port pocket was then created along the upper chest wall utilizing a combination of sharp and blunt dissection. The pocket was irrigated with sterile saline, packed with gauze, and observed for hemorrhage. A single lumen "ISP" sized power injectable port was chosen for placement. The 8 Fr catheter was tunneled from the port pocket site to the venotomy incision. The port was placed in the pocket. The external catheter was trimmed to appropriate length. The dilator was exchanged for an 8 Fr peel-away sheath under fluoroscopic guidance. The catheter was then placed through the sheath and the sheath was removed. Final catheter positioning was confirmed and documented with a fluoroscopic spot radiograph. The port  was accessed with a Huber needle, aspirated, and flushed with heparinized saline. The deep dermal layer of the port pocket incision was closed with interrupted 3-0 Vicryl suture. Dermabond was then placed over the port pocket and neck incisions. The patient tolerated the procedure well without immediate post procedural complication. FINDINGS: After catheter  placement, the tip lies within the superior cavoatrial junction. The catheter aspirates and flushes normally and is ready for immediate use. IMPRESSION: Successful placement of a power injectable Port-A-Cath via the right internal jugular vein. The catheter is ready for immediate use. Ruthann Cancer, MD [facility] Radiology Electronically Signed   By: Ruthann Cancer M.D.   On: 02/07/2023 14:18   CT Angio Chest PE W and/or Wo Contrast  Result Date: 02/06/2023 CLINICAL DATA:  SOB EXAM: CT ANGIOGRAPHY CHEST WITH CONTRAST TECHNIQUE: Multidetector CT imaging of the chest was performed using the standard protocol during bolus administration of intravenous contrast. Multiplanar CT image reconstructions and MIPs were obtained to evaluate the vascular anatomy. RADIATION DOSE REDUCTION: This exam was performed according to the departmental dose-optimization program which includes automated exposure control, adjustment of the mA and/or kV according to patient size and/or use of iterative reconstruction technique. CONTRAST:  75 mL OMNIPAQUE IOHEXOL 350 MG/ML SOLN COMPARISON:  01/13/2023 FINDINGS: Cardiovascular: Satisfactory opacification of the pulmonary arteries to the segmental level. No evidence of pulmonary embolism. Normal heart size. No pericardial effusion. Atheromatous changes of the aorta and coronary arteries. No aortic aneurysm or dissection identified. Mediastinum/Nodes: Right paratracheal node measures 1 cm. Left prevascular node measures 1 cm. Superior mediastinal structures unremarkable. Lungs/Pleura: There  is extensive diffuse bilateral nodular interstitial process with innumerable lesions. This is consistent with an atypical infection or metastatic disease and there has been no appreciable interval change compared to the prior study from January 2024. Upper Abdomen: Ill-defined mass pancreatic head not fully imaged on this study. Musculoskeletal: Thoracic degenerative changes noted. No osteolytic or osteoblastic lesions identified. Review of the MIP images confirms the above findings. IMPRESSION: 1. No PE, aneurysm or dissection. 2. Extensive nodular interstitial process throughout the lungs consistent with atypical infection versus metastatic disease without definite interval change. 3. Nonspecific mildly prominent mediastinal nodes. 4. Pancreatic head mass that was not fully imaged on this study. Electronically Signed   By: Sammie Bench M.D.   On: 02/06/2023 12:33   DG Chest Port 1 View  Result Date: 02/06/2023 CLINICAL DATA:  Dyspnea EXAM: PORTABLE CHEST 1 VIEW COMPARISON:  02/04/2023 FINDINGS: The heart size and mediastinal contours are within normal limits. Diffuse bilateral interstitial pulmonary opacity. The visualized skeletal structures are unremarkable. IMPRESSION: Diffuse bilateral interstitial pulmonary opacity, consistent with edema or atypical/viral infection. No new or focal airspace opacity. Electronically Signed   By: Delanna Ahmadi M.D.   On: 02/06/2023 10:20   DG CHEST PORT 1 VIEW  Result Date: 02/04/2023 CLINICAL DATA:  SOB EXAM: PORTABLE CHEST 1 VIEW COMPARISON:  02/03/2023 FINDINGS: Diffuse interstitial and alveolar opacity with no pneumothorax or pleural effusion identified. Unremarkable cardiac silhouette. IMPRESSION: Diffuse interstitial and alveolar edema versus bilateral pneumonia. Electronically Signed   By: Sammie Bench M.D.   On: 02/04/2023 08:01   DG CHEST PORT 1 VIEW  Result Date: 02/03/2023 CLINICAL DATA:  sob shortness of breath EXAM: PORTABLE CHEST - 1 VIEW  COMPARISON:  02/02/2023 FINDINGS: Scattered interstitial and airspace opacities throughout both lungs, involving bases more than apices, grossly stable compared to the previous radiograph. Heart size and mediastinal contours are within normal limits. Aortic Atherosclerosis (ICD10-170.0). No effusion. Visualized bones unremarkable.  Surgical clips right breast. IMPRESSION: Stable bilateral interstitial and airspace opacities. No acute findings. Electronically Signed   By: Lucrezia Europe M.D.   On: 02/03/2023 08:18   DG CHEST PORT 1 VIEW  Result Date: 02/02/2023 CLINICAL DATA:  Shortness  of breath EXAM: PORTABLE CHEST 1 VIEW COMPARISON:  02/01/2023 and prior studies FINDINGS: The cardiomediastinal silhouette is unchanged. Bilateral interstitial/mild airspace opacities again noted, slightly increased on the RIGHT. There is no evidence of pneumothorax or large pleural effusion. No other changes are noted. IMPRESSION: Bilateral interstitial/mild airspace opacities, slightly increased on the RIGHT. Electronically Signed   By: Margarette Canada M.D.   On: 02/02/2023 08:25   DG CHEST PORT 1 VIEW  Result Date: 02/01/2023 CLINICAL DATA:  Shortness of breath.  Pneumonia. EXAM: PORTABLE CHEST 1 VIEW COMPARISON:  01/31/2023 FINDINGS: Heart size is stable. Diffuse interstitial prominence noted. Mild airspace disease is seen in both lower lungs, without significant change. No evidence of pleural effusion. IMPRESSION: No significant change in diffuse interstitial prominence and mild bibasilar airspace disease. Electronically Signed   By: Marlaine Hind M.D.   On: 02/01/2023 09:03   DG CHEST PORT 1 VIEW  Result Date: 01/31/2023 CLINICAL DATA:  Shortness of breath, dyspnea EXAM: PORTABLE CHEST 1 VIEW COMPARISON:  01/30/2023 FINDINGS: Bilateral lower lobe airspace disease and mild heterogeneous upper lobe airspace disease concerning for multilobar pneumonia. Right lower lobe airspace disease has improved compared with 01/24/2023. No  pleural effusion or pneumothorax. Heart and mediastinal contours are unremarkable. No acute osseous abnormality. Osteoarthritis of bilateral glenohumeral joints. IMPRESSION: 1. Bilateral lower lobe airspace disease and mild heterogeneous upper lobe airspace disease concerning for multilobar pneumonia. Electronically Signed   By: Kathreen Devoid M.D.   On: 01/31/2023 12:20   US Abdomen Limited RUQ (LIVER/GB)  Result Date: 01/30/2023 CLINICAL DATA:  Abnormal liver function tests. EXAM: ULTRASOUND ABDOMEN LIMITED RIGHT UPPER QUADRANT COMPARISON:  October 03, 2014.  November 14, 2014. FINDINGS: Gallbladder: Status post cholecystectomy. Common bile duct: Diameter: 4 mm which is within normal limits. Liver: No focal lesion identified. Nodular hepatic contours are noted suggesting cirrhosis. Heterogeneous echotexture of hepatic parenchyma is noted. Portal vein is patent on color Doppler imaging with normal direction of blood flow towards the liver. Other: None. IMPRESSION: Status post cholecystectomy. Findings consistent with hepatic cirrhosis. No definite focal sonographic hepatic abnormality seen. Electronically Signed   By: Marijo Conception M.D.   On: 01/30/2023 15:30   DG CHEST PORT 1 VIEW  Result Date: 01/30/2023 CLINICAL DATA:  [id number] with shortness of breath. EXAM: PORTABLE CHEST 1 VIEW COMPARISON:  Portable chest yesterday at 9:29 a.m. FINDINGS: 4:55 a.m. There is interval worsening of patchy heterogeneous airspace disease in the lower lung fields. Small pleural effusions appear similar. The upper lung fields are generally clear. There is mild cardiomegaly without findings of acute CHF. The aorta is tortuous with calcification in the transverse segment. Stable mediastinum. Slight thoracic dextroscoliosis.  No new osseous abnormality. IMPRESSION: Interval worsening of patchy heterogeneous airspace disease in the lower lung fields bilaterally. Small pleural effusions. No findings of acute CHF or other significant  change. Electronically Signed   By: Telford Nab M.D.   On: 01/30/2023 07:18   DG CHEST PORT 1 VIEW  Result Date: 01/29/2023 CLINICAL DATA:  Short of breath EXAM: PORTABLE CHEST 1 VIEW COMPARISON:  Radiograph 01/24/2023, CT 01/13/2023 FINDINGS: Normal cardiac silhouette. Patchy bilateral airspace opacities appears slightly improved. No focal consolidation. No pneumothorax. No acute osseous abnormality. IMPRESSION: Slight improvement in patchy bilateral airspace opacities. Electronically Signed   By: Suzy Bouchard M.D.   On: 01/29/2023 09:59   ECHOCARDIOGRAM COMPLETE  Result Date: 01/26/2023    ECHOCARDIOGRAM REPORT   Patient Name:   Victoria [name] Date of Exam:  01/26/2023 Medical Rec #:  [id number]         Height:       68.0 in Accession #:    [id number]        Weight:       193.2 lb Date of Birth:  02/07/1941         BSA:          2.014 m Patient Age:    88 years          BP:           144/87 mmHg Patient Gender: F                 HR:           88 bpm. Exam Location:  Inpatient Procedure: 2D Echo Indications:    CHF  History:        Patient has prior history of Echocardiogram examinations, most                 recent 11/22/2022. Risk Factors:Diabetes and Hypertension.  Sonographer:    Harvie Junior Referring Phys: [id number] DANIEL V THOMPSON  Sonographer Comments: Image acquisition challenging due to respiratory motion. IMPRESSIONS  1. Left ventricular ejection fraction, by estimation, is 60 to 65%. The left ventricle has normal function. The left ventricle has no regional wall motion abnormalities. There is mild concentric left ventricular hypertrophy. Left ventricular diastolic parameters are consistent with Grade I diastolic dysfunction (impaired relaxation).  2. Right ventricular systolic function is normal. The right ventricular size is normal. There is normal pulmonary artery systolic pressure.  3. No evidence of mitral valve regurgitation.  4. The aortic valve is grossly normal. Aortic valve  regurgitation is not visualized.  5. The inferior vena cava is normal in size with greater than 50% respiratory variability, suggesting right atrial pressure of 3 mmHg. Comparison(s): No significant change from prior study. FINDINGS  Left Ventricle: Left ventricular ejection fraction, by estimation, is 60 to 65%. The left ventricle has normal function. The left ventricle has no regional wall motion abnormalities. The left ventricular internal cavity size was normal in size. There is  mild concentric left ventricular hypertrophy. Left ventricular diastolic parameters are consistent with Grade I diastolic dysfunction (impaired relaxation). Right Ventricle: The right ventricular size is normal. Right ventricular systolic function is normal. There is normal pulmonary artery systolic pressure. The tricuspid regurgitant velocity is 2.71 m/s, and with an assumed right atrial pressure of 3 mmHg,  the estimated right ventricular systolic pressure is [id number] mmHg. Left Atrium: Left atrial size was normal in size. Right Atrium: Right atrial size was normal in size. Pericardium: There is no evidence of pericardial effusion. Mitral Valve: No evidence of mitral valve regurgitation. Tricuspid Valve: Tricuspid valve regurgitation is mild. Aortic Valve: The aortic valve is grossly normal. Aortic valve regurgitation is not visualized. Aortic valve mean gradient measures 3.0 mmHg. Aortic valve peak gradient measures 5.6 mmHg. Aortic valve area, by VTI measures 2.78 cm. Pulmonic Valve: Pulmonic valve regurgitation is not visualized. Aorta: The aortic root and ascending aorta are structurally normal, with no evidence of dilitation. Venous: The inferior vena cava is normal in size with greater than 50% respiratory variability, suggesting right atrial pressure of 3 mmHg. IAS/Shunts: No atrial level shunt detected by color flow Doppler.  LEFT VENTRICLE PLAX 2D LVIDd:         3.50 cm     Diastology LVIDs:         2.40 cm  LV e' medial:     5.33 cm/s LV PW:         1.10 cm     LV E/e' medial:  12.5 LV IVS:        1.30 cm     LV e' lateral:   7.29 cm/s LVOT diam:     2.00 cm     LV E/e' lateral: 9.2 LV SV:         61 LV SV Index:   30 LVOT Area:     3.14 cm                             3D Volume EF: LV Volumes (MOD)           3D EF:        55 % LV vol d, MOD A2C: 51.5 ml LV EDV:       119 ml LV vol d, MOD A4C: 71.4 ml LV ESV:       54 ml LV vol s, MOD A2C: 20.2 ml LV SV:        66 ml LV vol s, MOD A4C: 27.8 ml LV SV MOD A2C:     31.3 ml LV SV MOD A4C:     71.4 ml LV SV MOD BP:      37.2 ml RIGHT VENTRICLE RV S prime:     17.00 cm/s TAPSE (M-mode): 1.9 cm LEFT ATRIUM           Index LA diam:      3.20 cm 1.59 cm/m LA Vol (A4C): 40.9 ml 20.31 ml/m  AORTIC VALVE                    PULMONIC VALVE AV Area (Vmax):    2.60 cm     PV Vmax:       0.81 m/s AV Area (Vmean):   2.56 cm     PV Peak grad:  2.7 mmHg AV Area (VTI):     2.78 cm AV Vmax:           118.00 cm/s AV Vmean:          81.400 cm/s AV VTI:            0.220 m AV Peak Grad:      5.6 mmHg AV Mean Grad:      3.0 mmHg LVOT Vmax:         97.70 cm/s LVOT Vmean:        66.400 cm/s LVOT VTI:          0.195 m LVOT/AV VTI ratio: 0.89  AORTA Ao Root diam: 3.40 cm Ao Asc diam:  3.50 cm MITRAL VALVE               TRICUSPID VALVE MV Area (PHT): 3.99 cm    TR Peak grad:   29.4 mmHg MV Decel Time: 190 msec    TR Vmax:        271.00 cm/s MV E velocity: 66.80 cm/s MV A velocity: 95.50 cm/s  SHUNTS MV E/A ratio:  0.70        Systemic VTI:  0.20 m                            Systemic Diam: 2.00 cm Phineas Inches Electronically signed by Phineas Inches Signature Date/Time: 01/26/2023/1:19:22 PM    Final  DG Chest Portable 1 View  Result Date: 01/24/2023 CLINICAL DATA:  Shortness of breath, recent pneumonia and PE. EXAM: PORTABLE CHEST 1 VIEW COMPARISON:  Chest x-ray dated 12/06/2022. FINDINGS: Diffuse bilateral interstitial prominence, presumably edema, and patchy bibasilar airspace opacities. No pleural effusion or  pneumothorax is seen. Heart size and mediastinal contours are stable. IMPRESSION: 1. Patchy bibasilar airspace opacities, multifocal pneumonia versus asymmetric pulmonary edema. 2. Bilateral interstitial prominence, also compatible with atypical pneumonia versus edema. Electronically Signed   By: Franki Cabot M.D.   On: 01/24/2023 10:40    Microbiology: Recent Results (from the past 240 hour(s))  Culture, blood (routine x 2)     Status: None (Preliminary result)   Collection Time: 02/12/2023  8:20 PM   Specimen: BLOOD  Result Value Ref Range Status   Specimen Description BLOOD LEFT ANTECUBITAL  Final   Special Requests   Final    BOTTLES DRAWN AEROBIC AND ANAEROBIC Blood Culture results may not be optimal due to an inadequate volume of blood received in culture bottles   Culture   Final    NO GROWTH 2 DAYS Performed at [facility], [place] [address] [place], [place] [address]    Report Status PENDING  Incomplete  Culture, blood (routine x 2)     Status: None (Preliminary result)   Collection Time: 02/13/2023  8:24 PM   Specimen: BLOOD  Result Value Ref Range Status   Specimen Description BLOOD LEFT ANTECUBITAL  Final   Special Requests   Final    BOTTLES DRAWN AEROBIC AND ANAEROBIC Blood Culture results may not be optimal due to an inadequate volume of blood received in culture bottles   Culture   Final    NO GROWTH 2 DAYS Performed at [facility], [place] [address], [place], [place] [address]    Report Status PENDING  Incomplete    Time spent: 25 minutes minutes  Signed: Annita Brod, MD 03/15/2023

## 2023-03-24 NOTE — Progress Notes (Addendum)
Patient was unresponsive ,pulses absent,with DNR order no CPR initiated,charge nurse and another nurse pronounced dead at [facility]. On call,Abigail Chavez,NP informed at 0231. Family was at beside at the time of death. [facility] informed at [phone] and referral number given.

## 2023-03-24 DEATH — deceased

## 2024-09-20 ENCOUNTER — Other Ambulatory Visit (HOSPITAL_BASED_OUTPATIENT_CLINIC_OR_DEPARTMENT_OTHER): Payer: Self-pay
